# Patient Record
Sex: Male | Born: 1950 | Race: White | Hispanic: No | Marital: Married | Smoking: Current every day smoker
Health system: Southern US, Community
[De-identification: ages and names within clinical notes are randomized; demographics above are authoritative.]

## PROBLEM LIST (undated history)

## (undated) DIAGNOSIS — M5136 Other intervertebral disc degeneration, lumbar region: Secondary | ICD-10-CM

## (undated) DIAGNOSIS — K760 Fatty (change of) liver, not elsewhere classified: Secondary | ICD-10-CM

## (undated) DIAGNOSIS — I509 Heart failure, unspecified: Secondary | ICD-10-CM

## (undated) DIAGNOSIS — I8390 Asymptomatic varicose veins of unspecified lower extremity: Secondary | ICD-10-CM

## (undated) DIAGNOSIS — J189 Pneumonia, unspecified organism: Secondary | ICD-10-CM

## (undated) DIAGNOSIS — R42 Dizziness and giddiness: Secondary | ICD-10-CM

## (undated) DIAGNOSIS — I639 Cerebral infarction, unspecified: Secondary | ICD-10-CM

## (undated) DIAGNOSIS — M545 Low back pain, unspecified: Secondary | ICD-10-CM

## (undated) DIAGNOSIS — F32A Depression, unspecified: Secondary | ICD-10-CM

## (undated) DIAGNOSIS — K219 Gastro-esophageal reflux disease without esophagitis: Secondary | ICD-10-CM

## (undated) DIAGNOSIS — K7460 Unspecified cirrhosis of liver: Secondary | ICD-10-CM

## (undated) DIAGNOSIS — Z8719 Personal history of other diseases of the digestive system: Secondary | ICD-10-CM

## (undated) DIAGNOSIS — R519 Headache, unspecified: Secondary | ICD-10-CM

## (undated) DIAGNOSIS — C229 Malignant neoplasm of liver, not specified as primary or secondary: Secondary | ICD-10-CM

## (undated) DIAGNOSIS — Z950 Presence of cardiac pacemaker: Secondary | ICD-10-CM

## (undated) DIAGNOSIS — I10 Essential (primary) hypertension: Secondary | ICD-10-CM

## (undated) DIAGNOSIS — C801 Malignant (primary) neoplasm, unspecified: Secondary | ICD-10-CM

## (undated) DIAGNOSIS — I209 Angina pectoris, unspecified: Secondary | ICD-10-CM

## (undated) DIAGNOSIS — I4892 Unspecified atrial flutter: Secondary | ICD-10-CM

## (undated) DIAGNOSIS — M51369 Other intervertebral disc degeneration, lumbar region without mention of lumbar back pain or lower extremity pain: Secondary | ICD-10-CM

## (undated) DIAGNOSIS — I442 Atrioventricular block, complete: Secondary | ICD-10-CM

## (undated) DIAGNOSIS — G8929 Other chronic pain: Secondary | ICD-10-CM

## (undated) DIAGNOSIS — R042 Hemoptysis: Secondary | ICD-10-CM

## (undated) DIAGNOSIS — G4730 Sleep apnea, unspecified: Secondary | ICD-10-CM

## (undated) DIAGNOSIS — F329 Major depressive disorder, single episode, unspecified: Secondary | ICD-10-CM

## (undated) DIAGNOSIS — I455 Other specified heart block: Secondary | ICD-10-CM

## (undated) DIAGNOSIS — E119 Type 2 diabetes mellitus without complications: Secondary | ICD-10-CM

## (undated) DIAGNOSIS — M1990 Unspecified osteoarthritis, unspecified site: Secondary | ICD-10-CM

## (undated) DIAGNOSIS — J439 Emphysema, unspecified: Secondary | ICD-10-CM

## (undated) DIAGNOSIS — R51 Headache: Secondary | ICD-10-CM

## (undated) DIAGNOSIS — E039 Hypothyroidism, unspecified: Secondary | ICD-10-CM

## (undated) HISTORY — PX: BACK SURGERY: SHX140

## (undated) HISTORY — DX: Malignant neoplasm of liver, not specified as primary or secondary: C22.9

## (undated) HISTORY — PX: LUMBAR DISC SURGERY: SHX700

## (undated) HISTORY — DX: Presence of cardiac pacemaker: Z95.0

## (undated) HISTORY — DX: Atrioventricular block, complete: I44.2

## (undated) HISTORY — DX: Unspecified atrial flutter: I48.92

---

## 1988-12-10 HISTORY — PX: OTHER SURGICAL HISTORY: SHX169

## 1990-04-11 HISTORY — PX: NASAL SEPTUM SURGERY: SHX37

## 1990-04-11 HISTORY — PX: TONSILLECTOMY AND ADENOIDECTOMY: SUR1326

## 1991-04-12 HISTORY — PX: CHOLECYSTECTOMY: SHX55

## 1997-04-11 HISTORY — PX: POSTERIOR FUSION LUMBAR SPINE: SUR632

## 1998-07-10 ENCOUNTER — Encounter: Payer: Self-pay | Admitting: Neurological Surgery

## 1998-07-10 ENCOUNTER — Ambulatory Visit (HOSPITAL_COMMUNITY): Admission: RE | Admit: 1998-07-10 | Discharge: 1998-07-10 | Payer: Self-pay | Admitting: Neurological Surgery

## 1998-07-23 ENCOUNTER — Encounter: Payer: Self-pay | Admitting: Neurological Surgery

## 1998-07-27 ENCOUNTER — Inpatient Hospital Stay (HOSPITAL_COMMUNITY): Admission: RE | Admit: 1998-07-27 | Discharge: 1998-08-02 | Payer: Self-pay | Admitting: Neurological Surgery

## 1998-07-27 ENCOUNTER — Encounter: Payer: Self-pay | Admitting: Neurological Surgery

## 1998-07-28 ENCOUNTER — Encounter: Payer: Self-pay | Admitting: Neurological Surgery

## 1998-07-29 ENCOUNTER — Encounter: Payer: Self-pay | Admitting: Neurological Surgery

## 1999-01-01 ENCOUNTER — Encounter: Admission: RE | Admit: 1999-01-01 | Discharge: 1999-04-01 | Payer: Self-pay | Admitting: Neurological Surgery

## 1999-01-08 ENCOUNTER — Ambulatory Visit (HOSPITAL_COMMUNITY): Admission: RE | Admit: 1999-01-08 | Discharge: 1999-01-08 | Payer: Self-pay | Admitting: Neurological Surgery

## 1999-01-08 ENCOUNTER — Encounter: Payer: Self-pay | Admitting: Neurological Surgery

## 2000-11-06 ENCOUNTER — Ambulatory Visit (HOSPITAL_COMMUNITY): Admission: RE | Admit: 2000-11-06 | Discharge: 2000-11-06 | Payer: Self-pay | Admitting: Family Medicine

## 2000-11-06 ENCOUNTER — Encounter: Payer: Self-pay | Admitting: Family Medicine

## 2000-11-30 ENCOUNTER — Encounter: Payer: Self-pay | Admitting: Neurological Surgery

## 2000-11-30 ENCOUNTER — Encounter: Admission: RE | Admit: 2000-11-30 | Discharge: 2000-11-30 | Payer: Self-pay | Admitting: Neurological Surgery

## 2001-01-01 ENCOUNTER — Ambulatory Visit (HOSPITAL_COMMUNITY): Admission: RE | Admit: 2001-01-01 | Discharge: 2001-01-01 | Payer: Self-pay | Admitting: Family Medicine

## 2001-01-01 ENCOUNTER — Encounter: Payer: Self-pay | Admitting: Family Medicine

## 2001-03-02 ENCOUNTER — Encounter: Payer: Self-pay | Admitting: Neurological Surgery

## 2001-03-02 ENCOUNTER — Ambulatory Visit (HOSPITAL_COMMUNITY): Admission: RE | Admit: 2001-03-02 | Discharge: 2001-03-02 | Payer: Self-pay | Admitting: Neurological Surgery

## 2001-03-14 ENCOUNTER — Observation Stay (HOSPITAL_COMMUNITY): Admission: AD | Admit: 2001-03-14 | Discharge: 2001-03-16 | Payer: Self-pay | Admitting: Neurological Surgery

## 2001-03-14 ENCOUNTER — Encounter: Payer: Self-pay | Admitting: Neurological Surgery

## 2002-07-30 ENCOUNTER — Encounter: Payer: Self-pay | Admitting: Family Medicine

## 2002-07-30 ENCOUNTER — Ambulatory Visit (HOSPITAL_COMMUNITY): Admission: RE | Admit: 2002-07-30 | Discharge: 2002-07-30 | Payer: Self-pay | Admitting: Family Medicine

## 2002-12-14 ENCOUNTER — Encounter: Payer: Self-pay | Admitting: Emergency Medicine

## 2002-12-14 ENCOUNTER — Emergency Department (HOSPITAL_COMMUNITY): Admission: EM | Admit: 2002-12-14 | Discharge: 2002-12-14 | Payer: Self-pay | Admitting: Emergency Medicine

## 2003-10-09 ENCOUNTER — Ambulatory Visit (HOSPITAL_COMMUNITY): Admission: RE | Admit: 2003-10-09 | Discharge: 2003-10-09 | Payer: Self-pay | Admitting: Family Medicine

## 2003-12-17 ENCOUNTER — Ambulatory Visit (HOSPITAL_COMMUNITY): Admission: RE | Admit: 2003-12-17 | Discharge: 2003-12-17 | Payer: Self-pay | Admitting: Neurological Surgery

## 2004-04-11 HISTORY — PX: SPINAL CORD STIMULATOR IMPLANT: SHX2422

## 2004-09-03 ENCOUNTER — Ambulatory Visit (HOSPITAL_COMMUNITY): Admission: RE | Admit: 2004-09-03 | Discharge: 2004-09-03 | Payer: Self-pay | Admitting: Family Medicine

## 2004-09-20 ENCOUNTER — Ambulatory Visit (HOSPITAL_COMMUNITY): Admission: RE | Admit: 2004-09-20 | Discharge: 2004-09-20 | Payer: Self-pay | Admitting: General Surgery

## 2004-09-22 ENCOUNTER — Ambulatory Visit (HOSPITAL_COMMUNITY): Admission: RE | Admit: 2004-09-22 | Discharge: 2004-09-22 | Payer: Self-pay | Admitting: General Surgery

## 2004-09-29 ENCOUNTER — Ambulatory Visit (HOSPITAL_COMMUNITY): Admission: RE | Admit: 2004-09-29 | Discharge: 2004-09-29 | Payer: Self-pay | Admitting: Family Medicine

## 2004-10-19 HISTORY — PX: OTHER SURGICAL HISTORY: SHX169

## 2004-10-27 ENCOUNTER — Ambulatory Visit: Payer: Self-pay | Admitting: Internal Medicine

## 2004-11-08 ENCOUNTER — Ambulatory Visit (HOSPITAL_COMMUNITY): Admission: RE | Admit: 2004-11-08 | Discharge: 2004-11-08 | Payer: Self-pay | Admitting: Internal Medicine

## 2004-11-08 ENCOUNTER — Ambulatory Visit: Payer: Self-pay | Admitting: Internal Medicine

## 2004-11-08 ENCOUNTER — Encounter: Payer: Self-pay | Admitting: Internal Medicine

## 2004-11-08 HISTORY — PX: COLONOSCOPY: SHX174

## 2004-11-08 HISTORY — PX: ESOPHAGOGASTRODUODENOSCOPY: SHX1529

## 2005-01-03 ENCOUNTER — Ambulatory Visit: Payer: Self-pay | Admitting: Internal Medicine

## 2005-02-03 ENCOUNTER — Other Ambulatory Visit: Admission: RE | Admit: 2005-02-03 | Discharge: 2005-02-03 | Payer: Self-pay | Admitting: Otolaryngology

## 2005-02-04 ENCOUNTER — Ambulatory Visit (HOSPITAL_COMMUNITY): Admission: RE | Admit: 2005-02-04 | Discharge: 2005-02-04 | Payer: Self-pay | Admitting: Otolaryngology

## 2005-05-19 ENCOUNTER — Ambulatory Visit: Payer: Self-pay | Admitting: Physical Medicine and Rehabilitation

## 2005-05-19 ENCOUNTER — Encounter
Admission: RE | Admit: 2005-05-19 | Discharge: 2005-08-17 | Payer: Self-pay | Admitting: Physical Medicine and Rehabilitation

## 2005-06-02 ENCOUNTER — Emergency Department (HOSPITAL_COMMUNITY): Admission: EM | Admit: 2005-06-02 | Discharge: 2005-06-02 | Payer: Self-pay | Admitting: Emergency Medicine

## 2005-06-16 ENCOUNTER — Ambulatory Visit: Payer: Self-pay | Admitting: Internal Medicine

## 2005-06-21 ENCOUNTER — Ambulatory Visit (HOSPITAL_COMMUNITY): Admission: RE | Admit: 2005-06-21 | Discharge: 2005-06-21 | Payer: Self-pay | Admitting: Internal Medicine

## 2005-08-05 ENCOUNTER — Ambulatory Visit (HOSPITAL_COMMUNITY): Admission: RE | Admit: 2005-08-05 | Discharge: 2005-08-05 | Payer: Self-pay | Admitting: Neurological Surgery

## 2005-08-30 ENCOUNTER — Ambulatory Visit (HOSPITAL_COMMUNITY): Admission: RE | Admit: 2005-08-30 | Discharge: 2005-08-30 | Payer: Self-pay | Admitting: Neurological Surgery

## 2005-11-17 ENCOUNTER — Ambulatory Visit (HOSPITAL_COMMUNITY): Admission: RE | Admit: 2005-11-17 | Discharge: 2005-11-17 | Payer: Self-pay | Admitting: Neurological Surgery

## 2006-01-09 ENCOUNTER — Ambulatory Visit: Payer: Self-pay | Admitting: Internal Medicine

## 2008-04-11 HISTORY — PX: ESOPHAGOGASTRODUODENOSCOPY: SHX1529

## 2008-04-29 ENCOUNTER — Ambulatory Visit (HOSPITAL_COMMUNITY): Admission: RE | Admit: 2008-04-29 | Discharge: 2008-04-29 | Payer: Self-pay | Admitting: Family Medicine

## 2008-12-04 DIAGNOSIS — R490 Dysphonia: Secondary | ICD-10-CM | POA: Insufficient documentation

## 2008-12-04 DIAGNOSIS — R1314 Dysphagia, pharyngoesophageal phase: Secondary | ICD-10-CM | POA: Insufficient documentation

## 2008-12-04 DIAGNOSIS — K219 Gastro-esophageal reflux disease without esophagitis: Secondary | ICD-10-CM | POA: Insufficient documentation

## 2008-12-04 DIAGNOSIS — K2900 Acute gastritis without bleeding: Secondary | ICD-10-CM | POA: Insufficient documentation

## 2008-12-04 DIAGNOSIS — R079 Chest pain, unspecified: Secondary | ICD-10-CM | POA: Insufficient documentation

## 2008-12-05 ENCOUNTER — Ambulatory Visit: Payer: Self-pay | Admitting: Internal Medicine

## 2008-12-09 ENCOUNTER — Encounter: Payer: Self-pay | Admitting: Internal Medicine

## 2008-12-16 ENCOUNTER — Ambulatory Visit (HOSPITAL_COMMUNITY): Admission: RE | Admit: 2008-12-16 | Discharge: 2008-12-16 | Payer: Self-pay | Admitting: Internal Medicine

## 2008-12-16 ENCOUNTER — Ambulatory Visit: Payer: Self-pay | Admitting: Internal Medicine

## 2008-12-16 ENCOUNTER — Encounter: Payer: Self-pay | Admitting: Internal Medicine

## 2008-12-18 ENCOUNTER — Encounter: Payer: Self-pay | Admitting: Internal Medicine

## 2008-12-19 ENCOUNTER — Encounter: Payer: Self-pay | Admitting: Urgent Care

## 2008-12-22 ENCOUNTER — Ambulatory Visit (HOSPITAL_COMMUNITY): Admission: RE | Admit: 2008-12-22 | Discharge: 2008-12-22 | Payer: Self-pay | Admitting: Internal Medicine

## 2008-12-23 ENCOUNTER — Telehealth (INDEPENDENT_AMBULATORY_CARE_PROVIDER_SITE_OTHER): Payer: Self-pay

## 2009-01-13 ENCOUNTER — Encounter: Payer: Self-pay | Admitting: Urgent Care

## 2009-01-13 ENCOUNTER — Ambulatory Visit: Payer: Self-pay | Admitting: Internal Medicine

## 2009-01-13 DIAGNOSIS — K7460 Unspecified cirrhosis of liver: Secondary | ICD-10-CM | POA: Insufficient documentation

## 2009-01-15 LAB — CONVERTED CEMR LAB
Ferritin: 194 ng/mL (ref 22–322)
Hep B S Ab: POSITIVE — AB
Hepatitis B Surface Ag: NEGATIVE
Iron: 92 ug/dL (ref 42–165)

## 2009-02-02 ENCOUNTER — Emergency Department (HOSPITAL_COMMUNITY): Admission: EM | Admit: 2009-02-02 | Discharge: 2009-02-02 | Payer: Self-pay | Admitting: Emergency Medicine

## 2009-02-13 ENCOUNTER — Ambulatory Visit (HOSPITAL_COMMUNITY): Admission: RE | Admit: 2009-02-13 | Discharge: 2009-02-13 | Payer: Self-pay | Admitting: Neurological Surgery

## 2009-05-14 ENCOUNTER — Ambulatory Visit: Payer: Self-pay | Admitting: Internal Medicine

## 2009-05-14 ENCOUNTER — Encounter (INDEPENDENT_AMBULATORY_CARE_PROVIDER_SITE_OTHER): Payer: Self-pay

## 2009-05-14 DIAGNOSIS — R109 Unspecified abdominal pain: Secondary | ICD-10-CM | POA: Insufficient documentation

## 2009-05-14 DIAGNOSIS — K859 Acute pancreatitis without necrosis or infection, unspecified: Secondary | ICD-10-CM | POA: Insufficient documentation

## 2009-05-15 ENCOUNTER — Ambulatory Visit (HOSPITAL_COMMUNITY): Admission: RE | Admit: 2009-05-15 | Discharge: 2009-05-15 | Payer: Self-pay | Admitting: Internal Medicine

## 2009-05-15 LAB — CONVERTED CEMR LAB
AFP-Tumor Marker: 2.7 ng/mL (ref 0.0–8.0)
ALT: 67 units/L — ABNORMAL HIGH (ref 0–53)
AST: 31 units/L (ref 0–37)
Basophils Absolute: 0 10*3/uL (ref 0.0–0.1)
Eosinophils Absolute: 0.4 10*3/uL (ref 0.0–0.7)
Eosinophils Relative: 4 % (ref 0–5)
HCT: 50.7 % (ref 39.0–52.0)
Indirect Bilirubin: 0.2 mg/dL (ref 0.0–0.9)
Lipase: 26 units/L (ref 0–75)
MCHC: 33.5 g/dL (ref 30.0–36.0)
MCV: 97.7 fL (ref 78.0–100.0)
Monocytes Absolute: 0.7 10*3/uL (ref 0.1–1.0)
Platelets: 230 10*3/uL (ref 150–400)
RDW: 12.8 % (ref 11.5–15.5)
Total Protein: 7.2 g/dL (ref 6.0–8.3)

## 2009-05-18 ENCOUNTER — Encounter: Payer: Self-pay | Admitting: Internal Medicine

## 2009-06-01 ENCOUNTER — Encounter: Payer: Self-pay | Admitting: Urgent Care

## 2009-06-11 ENCOUNTER — Ambulatory Visit: Payer: Self-pay | Admitting: Internal Medicine

## 2009-06-14 DIAGNOSIS — R1011 Right upper quadrant pain: Secondary | ICD-10-CM | POA: Insufficient documentation

## 2009-06-25 LAB — CONVERTED CEMR LAB
ALT: 50 units/L (ref 0–53)
Albumin: 4.5 g/dL (ref 3.5–5.2)
Alkaline Phosphatase: 121 units/L — ABNORMAL HIGH (ref 39–117)
Total Protein: 7.4 g/dL (ref 6.0–8.3)

## 2009-06-30 ENCOUNTER — Encounter: Payer: Self-pay | Admitting: Urgent Care

## 2009-12-02 ENCOUNTER — Telehealth (INDEPENDENT_AMBULATORY_CARE_PROVIDER_SITE_OTHER): Payer: Self-pay | Admitting: *Deleted

## 2010-04-27 ENCOUNTER — Encounter: Payer: Self-pay | Admitting: Urgent Care

## 2010-05-01 ENCOUNTER — Encounter: Payer: Self-pay | Admitting: Otolaryngology

## 2010-05-02 ENCOUNTER — Encounter: Payer: Self-pay | Admitting: General Surgery

## 2010-05-11 NOTE — Assessment & Plan Note (Signed)
Summary: fu ov 3 mo with RMR per KJ,cirrhosis,esophageal varacies/ams   Visit Type:  Follow-up Visit Primary Care Provider:  Cresenzo  Chief Complaint:  F/U cirrhosis.  History of Present Illness: Patient with Elita Boone cirrhosis here for followup. He is immune to hepatitis A and B;  his iron studies were normal. He has a history of erosive reflux esophagitis and grade 1 esophageal varices. Her ultrasound revealed no tumor; alpha-fetoprotein were normal. He is due for surveillance EGD 2012 , he is due for alpha-fetoprotein and liver imaging in about 6 months.  He does have a new problem that of postprandial right upper quadrant abdominal pain which are reminiscent of Sx which led to his gallbladder removal back in the 90s.  He states he can have pain waxes . IIt chiefly occurs and last for couple hours after he eats a mea; he has history of stable lung lesion on prior CT through this office. He was to followup with Dr. Nobie Putnam per radiologist's recommendations. He has a history of LPR - has seen Drs. Gerilyn Pilgrim and  Dr. Pollyann Kennedy in the past - felt to be under fairly good control at this time. Obesity and type 2 diabetes mellitus continue be a problem for this nice gentleman.  Current Medications (verified): 1)  Multivitamins  Tabs (Multiple Vitamin) .... Take 1 Tablet By Mouth Once A Day 2)  Metformin Hcl 500 Mg Tabs (Metformin Hcl) .... One Tablet Two Times A Day 3)  Budeprion Sr 150 Mg Xr12h-Tab (Bupropion Hcl) .... Take 1 Tablet By Mouth Two Times A Day 4)  Zestril 10 Mg Tabs (Lisinopril) .... Take 1 Tablet By Mouth Once A Day 5)  Androgel Pump 1 % Gel (Testosterone) .... As Directed 6)  Pap Req Taimix .... One Ml As Needed 7)  Protonix 40 Mg Tbec (Pantoprazole Sodium) .... Two Times A Day X 1 Month, Then Once Daily 8)  Vicodin Hp 10-660 Mg Tabs (Hydrocodone-Acetaminophen) .... One By Mouth As Needed For Pain 9)  Glimepiride 2 Mg Tabs (Glimepiride) .... Take 1 Tablet By Mouth Once A Day  in The  Am  Allergies (verified): 1)  ! Nitroglycerin    Past History:  Past Medical History: Last updated: 01/13/2009 Diabetic Hypertension ED Depression Anxiety colonoscopy and EGD by Dr.Zohan Shiflet-2006- normal findings EGD by Dr Jena Gauss 12/16/08->Three columns of grade 1 esophageal varices, four quadrant distal esophageal erosions consistent with erosive reflux esophagitis, widely patent esophagus.  No dilation performed.  Hiatal hernia, portal gastropathy, gastric erosions status post biopsy, patent pylorus, normal D1-D2.  Past Surgical History: Last updated: 12/05/2008 Herniated disks-back and neck Back surgery x 3 Cholecystectomy Nasal surgery Spinal Cord Stimulator  Tumor (right neck)  Family History: Last updated: 12/05/2008 Father: Living age 38   healthy Mother: Living age 74   Hx Ovarian Cancer Siblings: One brother and one sister    healthy  Social History: Last updated: 12/05/2008 Marital Status: Married Children: One child Occupation: Retired/Disabled Patient currently smokes.  Alcohol Use - yes Illicit Drug Use - yes Patient does not get regular exercise.   Vital Signs:  Patient profile:   60 year old male Height:      70 inches Weight:      252 pounds BMI:     36.29 Temp:     97.8 degrees F oral Pulse rate:   84 / minute BP sitting:   142 / 80  (left arm) Cuff size:   regular  Vitals Entered By: Cloria Spring LPN (May 14, 2009 2:57 PM)  Physical Exam  General:  somewhat plus or appearing 60 year old resting comfortably he is alert well oriented. There's no flap Eyes:  no scleral icterus Breasts:  2+ gynecomastia Lungs:  clear to auscultation Abdomen:  obese positive bowel sounds no shifting dullness or fluid wave. He does have localized right upper quadrant tenderness just below the right costal margin anterior axillary line there's no appreciable mass but he does her significant localized tenderness in this area. I do not appreciate his spleen tip  or hepatomegaly  Impression & Recommendations: Impression: Elita Boone cirrhosis with esophageal varices and portal gastropathy. Reflux symptoms well-controlled on Protonix. he now has a new problem, that of post prandial right upper quadrant abdominal pain - gallbladder is out. History of a stable one nodule prior Chest CT Recommendations: Abdominal pelvic CT with IV and oral contrast. Hepatic profile and lipase CBC today would like retrieved last chest CT to make sure  the lung nodule has been wrapped up  He will need alpha-fetoprotein assay and repeat imaging study of his liver via ultrasound in 6 months;  I told Mr. Vejar to plan on getting a repeat surveillance EGD early part of 2012.  Other Orders: T-Hepatic Function [id number]) T-Lipase [id number]) T-CBC w/Diff [id number]) T-AFP Tumor Markers [id number])      Appended Document: Orders Update-charge    Clinical Lists Changes  Orders: Added new Service order of Est. Patient Level IV (28413) - Signed

## 2010-05-11 NOTE — Letter (Signed)
Summary: CT SCAN ORDER  CT SCAN ORDER   Imported By: Ave Filter 05/18/2009 08:53:14  _____________________________________________________________________  External Attachment:    Type:   Image     Comment:   External Document

## 2010-05-11 NOTE — Miscellaneous (Signed)
Summary: last chest tcs  Clinical Lists Changes CT Chest W/CM. - STATUS: Final  IMAGE                                     Perform Date: 13Mar07 14:02  Ordered By: Jena Gauss MD , Gerrit Friends           Ordered Date: 13Mar07 13:17  Facility: [facility]                               Department: CT  Service Report Text  [facility] Accession Number: [id number]    Clinical Data:  Chest pain.  Cough.  Left upper quadrant pain.   CHEST CT WITH CONTRAST:   Technique:  Multidetector CT imaging of the chest was performed   following the standard protocol during bolus administration of   intravenous contrast.   Contrast:  100cc Omnipaque 300.   Comparison:  CT of the chest of 09/22/2004.   Findings:  The nodules described in the left lower lobe measuring 8mm   and 5mm posterior to the right hemidiaphragm in the right lower lobe   appear stable.  The nodule is noted in the middle lobe also are   stable and most consistent with a benign process.  No new lung nodule   is seen.  Follow-up CT is recommended in 9-12 months if this patient   is high risk, i.e. long smoking history, or 18-24 months if low risk.   Emphysematous changes are noted in both upper lobes extending to the   apices.  No effusion is seen.  No mediastinal or hilar adenopathy is   seen.  The thoracic aorta and pulmonary arteries opacify normally.   IMPRESSION:   1. Stable lung nodules.  Suggest follow-up CT in 9-12 months if high   risk and 18-24 months if low risk.   2. COPD.   ABDOMEN CT WITH CONTRAST:   Technique:  Multidetector CT imaging of the abdomen was performed   following the standard protocol during bolus administration of   intravenous contrast.   Contrast:  100cc Omnipaque 300.   Findings:  Scans were continued through the abdomen after oral and IV   contrast media were given and compared to a CT abdomen of 09/20/2004.   The liver is low in attenuation suggesting fatty infiltration.  No   focal abnormality is seen.  Surgical clips are  present from prior   cholecystectomy.  The pancreas is stable in size and configuration,   as are the adrenal glands and the spleen. The kidneys enhance   normally and on delayed images the pelvocaliceal systems appear   normal.  No adenopathy is seen.  The abdominal aorta is normal in   caliber.  The portion of the appendix is well seen and appears   normal, as does the terminal ileum.   IMPRESSION:   Negative CT of the abdomen.  Question mild fatty infiltration of the   liver.  Prior cholecystectomy.    Read By:  Juline Patch,  [name]   Released By:  Juline Patch,  [name]  Additional Information  External image : [id number]  Appended Document: last chest tcs if this is the last Chest CT done, he needs another one right away to f/u on previous lung nodule  Appended Document: last chest tcs pt in radiology now  for CTof the abd/pelvis. CM called radiology and added chest ct to be done while pt is there.   Appended Document: last chest tcs very good

## 2010-05-11 NOTE — Letter (Signed)
Summary: Internal Other Tenna Child fax/06/02/2009  Internal Other Tenna Child fax/06/02/2009   Imported By: Cloria Spring LPN 37/16/9678 93:81:01  _____________________________________________________________________  External Attachment:    Type:   Image     Comment:   External Document

## 2010-05-11 NOTE — Progress Notes (Signed)
Summary: Repeat Image Study  ---- Converted from flag ---- ---- 12/01/2009 7:56 PM, Joselyn Arrow FNP-BC wrote: Looks like not seen since 05/2009, so I think he needs OV 1st  ---- 12/01/2009 5:18 PM, Ave Filter wrote: Would you like this patient to have a repeat U/S only? ------------------------------  Appended Document: Repeat Image Study pt is aware of appt for 9/16 at 0945 with RMR

## 2010-05-11 NOTE — Medication Information (Signed)
Summary: Tax adviser   Imported By: Diana Eves 06/01/2009 08:53:50  _____________________________________________________________________  External Attachment:    Type:   Image     Comment:   External Document  Appended Document: RX FolderPANTOPRAZOLE    Prescriptions: PROTONIX 40 MG TBEC (PANTOPRAZOLE SODIUM) one by mouth daily  #90 x 3   Entered and Authorized by:   Joselyn Arrow FNP-BC   Signed by:   Joselyn Arrow FNP-BC on 06/01/2009   Method used:   Printed then faxed to ...       [facility] (mail-order)       [address] [place], [place]  [address]       Ph: [phone]       Fax: [phone]   RxID:   [id number]  Please fax to [facility].   Appended Document: RX Folder Rx faxed.

## 2010-05-11 NOTE — Assessment & Plan Note (Signed)
Summary: ov fu in couple of weeks to reaccess/ss   Visit Type:  Follow-up Visit Primary Care Provider:  Cresenzo  Chief Complaint:  F/U abd pain.  History of Present Illness: Right  upper quadrant abdominal pain not much change.  definitely worse when he eats; sometimes doubles him over; it does not radiate; he does have a spinal cord stimulator which precludes an MRI.  CT of abdomen and pelvis demonstrated some upper limit of normal celiac nodes, stable pulmonary nodule since 2007 - nothing to explain his symptoms. Never developed a rash. No melena no hematochezia negative colonoscopy 2006.  He has not lost any weight. Minimal elevation in his ALT previously no dilation of the biliary tree. He cannot have an MRCP because of his nerve stimulator.  Pain is not worse  sitting, standing or moving. Overall, has not worsened since his last office visit. He has tken Neurontin the past and he did not like it. He's never developed a rash.  He reports no alcohol consumption whatsoever since last fall. Weight is stable at 252 pounds.  Current Medications (verified): 1)  Multivitamins  Tabs (Multiple Vitamin) .... Take 1 Tablet By Mouth Once A Day 2)  Metformin Hcl 500 Mg Tabs (Metformin Hcl) .... One Tablet Two Times A Day 3)  Budeprion Sr 150 Mg Xr12h-Tab (Bupropion Hcl) .... Take 1 Tablet By Mouth Two Times A Day 4)  Zestril 10 Mg Tabs (Lisinopril) .... Take 1 Tablet By Mouth Once A Day 5)  Androgel Pump 1 % Gel (Testosterone) .... As Directed 6)  Pap Req Taimix .... One Ml As Needed 7)  Protonix 40 Mg Tbec (Pantoprazole Sodium) .... One By Mouth Daily 8)  Vicodin Hp 10-660 Mg Tabs (Hydrocodone-Acetaminophen) .... One By Mouth As Needed For Pain 9)  Glimepiride 2 Mg Tabs (Glimepiride) .... Take 1 Tablet By Mouth Once A Day  in [place]  Allergies (verified): 1)  ! Nitroglycerin  Past History:  Past Medical History: Last updated:  01/13/2009 Diabetic Hypertension ED Depression Anxiety colonoscopy and EGD by Dr.Letoya Stallone-2006- normal findings EGD by Dr Jena Gauss 12/16/08->Three columns of grade 1 esophageal varices, four quadrant distal esophageal erosions consistent with erosive reflux esophagitis, widely patent esophagus.  No dilation performed.  Hiatal hernia, portal gastropathy, gastric erosions status post biopsy, patent pylorus, normal D1-D2.  Past Surgical History: Last updated: 12/05/2008 Herniated disks-back and neck Back surgery x 3 Cholecystectomy Nasal surgery Spinal Cord Stimulator  Tumor (right neck)  Family History: Last updated: 12/05/2008 Father: Living age 35   healthy Mother: Living age 56   Hx Ovarian Cancer Siblings: One brother and one sister    healthy  Social History: Last updated: 12/05/2008 Marital Status: Married Children: One child Occupation: Retired/Disabled Patient currently smokes.  Alcohol Use - yes Illicit Drug Use - yes Patient does not get regular exercise.   Risk Factors: Exercise: no (12/05/2008)  Vital Signs:  Patient profile:   60 year old male Height:      70 inches Weight:      252 pounds BMI:     36.29 Temp:     97.9 degrees F oral Pulse rate:   88 / minute BP sitting:   130 / 80  (left arm) Cuff size:   regular  Vitals Entered By: Cloria Spring LPN (June 11, 1608 8:52 AM)  Physical Exam  General:  alert conversant no acute distress Eyes:  no scleral icterus Chest Wall:  he does have some tenderness along his right costal  margin mid axillary line to palpation. Do not appreciate any bony deformity. Lungs:  clear to auscultation Abdomen:  obese positive bowel sounds A. Right upper quadrant really not tender he gets tender he palpate the right costal margin I do not appreciate any deformity or mass Extremities:  trace lower extremity edema Neurologic:  back no CVA tenderness  Impression & Recommendations: Impression: A several month history of right  upper quadrant abdominal pain somewhat reminiscent to his gallbladder symptoms. Minimally elevated the ALT previously no evidence of biliary dilation  or space-occupying lesion in his liver.  stable pulmonary  nodules.  His symptoms do have a biliary flare particularly postprandial component. He does have localized tenderness along the right costal margin which brings to mind a musculoskeleatal etiology etiology. Less likely, I feel this is radicular in origin but certainly could be a neuropathic component related to diabetes is not question either. Treatment options maybe limited.   Recommendations: Repeat hepatic profile .     immunofecal occult stool blood test; consider further evaluation in the very near future.  Other Orders: T-Hepatic Function [phone] [phone])      Appended Document: Orders Update-charge    Clinical Lists Changes  Problems: Added new problem of ABDOMINAL PAIN, RIGHT UPPER QUADRANT (ICD-789.01) Orders: Added new Service order of Est. Patient Level IV (91478) - Signed

## 2010-05-13 NOTE — Medication Information (Signed)
Summary: PROTONIX TAB 40MG   PROTONIX TAB 40MG    Imported By: Rexene Alberts 04/27/2010 08:14:55  _____________________________________________________________________  External Attachment:    Type:   Image     Comment:   External Document  Appended Document: PROTONIX TAB 40MG     Prescriptions: PROTONIX 40 MG TBEC (PANTOPRAZOLE SODIUM) one by mouth daily  #90 x 3   Entered and Authorized by:   Joselyn Arrow FNP-BC   Signed by:   Joselyn Arrow FNP-BC on 04/27/2010   Method used:   Print then Give to Patient   RxID:   [id number]     Appended Document: PROTONIX TAB 40MG  rx faxed to [facility]

## 2010-05-28 ENCOUNTER — Encounter (INDEPENDENT_AMBULATORY_CARE_PROVIDER_SITE_OTHER): Payer: Self-pay

## 2010-06-02 NOTE — Letter (Signed)
Summary: Recall Colonoscopy/Endoscopy, Change to Office Visit  [facility]  [address]   [place], [place] [address]   Phone: [phone]  Fax: [phone]      May 28, 2010   Ralph [name] [place], [place]  [address] Mar 31, 1951   Dear Mr. Gorder,   According to our records, it is time for you to schedule a Colonoscopy/Endoscopy. However, after reviewing your medical record, we recommend an office visit in order to determine your need for a repeat procedure.  Please call [phone] at your convenience to schedule an office visit. If you have any questions or concerns, please feel free to contact our office.   Sincerely,   Cloria Spring LPN  [facility] Ph: [phone]   Fax: [phone]

## 2010-06-03 ENCOUNTER — Encounter (INDEPENDENT_AMBULATORY_CARE_PROVIDER_SITE_OTHER): Payer: Self-pay | Admitting: *Deleted

## 2010-06-08 NOTE — Letter (Signed)
Summary: Recall, Screening Colonoscopy Only  [facility]  [address]   [place], [place] [address]   Phone: [phone]  Fax: [phone]    June 03, 2010  RAWSON MINIX [place], [place]  [address] 1950-09-11   Dear Mr. Scaturro,   Our records indicate it is time to schedule your colonoscopy.   Please call our office at [phone] and ask for the nurse.   Thank you, Hendricks Limes, LPN Cloria Spring, LPN  [facility] Ph: [phone]   Fax: [phone]

## 2010-06-30 LAB — CREATININE, SERUM
Creatinine, Ser: 0.99 mg/dL (ref 0.4–1.5)
GFR calc Af Amer: >60 mL/min (ref >60)
GFR calc non Af Amer: >60 mL/min (ref >60)

## 2010-07-16 LAB — PROTIME-INR
INR: 1 (ref 0.00–1.49)
Prothrombin Time: 13.4 seconds (ref 11.6–15.2)

## 2010-07-16 LAB — COMPREHENSIVE METABOLIC PANEL
Albumin: 3.6 g/dL (ref 3.5–5.2)
Alkaline Phosphatase: 82 U/L (ref 39–117)
BUN: 20 mg/dL (ref 6–23)
CO2: 28 mEq/L (ref 19–32)
Chloride: 103 mEq/L (ref 96–112)
Creatinine, Ser: 0.8 mg/dL (ref 0.4–1.5)
GFR calc non Af Amer: >60 mL/min (ref >60)
Glucose, Bld: 107 mg/dL — ABNORMAL HIGH (ref 70–99)
Potassium: 4.5 mEq/L (ref 3.5–5.1)
Total Bilirubin: 0.9 mg/dL (ref 0.3–1.2)

## 2010-07-16 LAB — CBC
HCT: 49.9 % (ref 39.0–52.0)
Hemoglobin: 17.3 g/dL — ABNORMAL HIGH (ref 13.0–17.0)
MCV: 98.7 fL (ref 78.0–100.0)
Platelets: 217 10*3/uL (ref 150–400)
WBC: 8.4 10*3/uL (ref 4.0–10.5)

## 2010-07-16 LAB — GLUCOSE, CAPILLARY

## 2010-08-27 NOTE — Op Note (Signed)
NAME:  Ralph [name], Ralph [name]                ACCOUNT NO.:  [id number]   MEDICAL RECORD NO.:  [id number]          PATIENT TYPE:  AMB   LOCATION:  SDS                          FACILITY:  [facility]   PHYSICIAN:  Stefani Dama, [name]  DATE OF BIRTH:  11/10/1950   DATE OF PROCEDURE:  08/30/2005  DATE OF DISCHARGE:  08/30/2005                                 OPERATIVE REPORT   PREOPERATIVE DIAGNOSIS:  Chronic lumbar pain with radiculopathy.   POSTOPERATIVE DIAGNOSES:  1.  Chronic lumbar pain with radiculopathy.  2.  Status post arthrodesis L2-L3.   HOSPITAL PROCEDURE:  Placement of a temporary spinal cord stimulation  electrode, T9-T10.   SURGEON:  Stefani Dama, [name]   ANESTHESIA:  Local plus IV sedation.   INDICATIONS:  [name] is a 60 year old individual who underwent a  diskectomy followed by fusion at the L2-L3 level or at L1-L2, depending on  how his lumbar vertebrae are counted.  The patient has had problems with  significant spondylosis.  He has had a previous herniated nucleus pulposus  at the L4-L5 level, and he has had chronic radicular pain with multiple  level degeneration from the top of the lumbar spine down to the lumbosacral  junction.  Because of the diffuseness of his disease process, it was advised  that we treat him conservatively and, having failed efforts at narcotic pain  management, conservative management and non-narcotic pain management, a  spinal cord stimulators has now been advised   PROCEDURE:  The patient was brought to the operating room and placed on  table in prone position.  IV analgesia was induced with some Versed.  The  patient had the back prepped with DuraPrep, and draped in sterile fashion.  Skin overlying the T12-L1 level was infiltrated with lidocaine, and an 11-  blade was used to create a stab incision at the chosen entry point.  A 17-  gauge Tuohy needle was then inserted into the epidural space at the T12-L1  level.  Initial attempts  at threading a catheter were unsuccessful, as the  catheter was noted to proceed ventrally in the spinal canal.  The needle was  repositioned in the same space and, again, attempts to place the catheter in  the dorsal epidural space was unsuccessful, and at this point, on removal of  the catheter, there was noted be some spinal fluid.  It was felt that the  patient had a wet tap and, for that reason, a level above the chosen entry  site was chosen, and on the second try, the catheter was placed into the  dorsal epidural space.  It was threaded into the position along T9-T10, and  initial trials of stimulation with this catheter were successful in terms of  giving the patient tingling sensation  along his back and buttocks.  With that, the needle was withdrawn, stylet  was withdrawn, the system was then sutured in place with a singular 2-0 silk  tie.  A dry sterile dressing was applied and the patient was returned to  recovery room in stable condition.  Stefani Dama, [name]  Electronically Signed     HJE/MEDQ  D:  08/30/2005  T:  08/31/2005  Job:  [id number]

## 2010-08-27 NOTE — Group Therapy Note (Signed)
HISTORY OF PRESENT ILLNESS:  [name] is a 60 year old gentleman referred  by Dr. Nobie Putnam. He was referred for management of his chronic pain  complaints. [name] states he has multiple pain areas that are bothering  him including the right shoulder, the mid thoracic area, the low back into  the right buttock and down the left lower extremity. He states his low back  pain is between a 4 and an 8 on a scale of 10. When his pain is at a 4, he  can function. But when it gets up to a 7 or 8, he has difficulty  functioning. His leg pain is between a 3 and 6 on a scale of 10 and varies  in its intensity. He has pain essentially all the time but the intensity of  his pain varies and the nature of his pain varies as well, sometimes more  tingling, aching or stabbing. His sleep is poor. He gets little relief with  the current medications that he is on. The pain is typically exacerbated by  activity. Improves with rest. Ralph [name] also states he is quite depressed.  He has had some episodes where he has considered suicide; however, at this  time, he does not feel he has a plan nor would he carry it out. He has  thought about it but at this point, would not act on it. He states that he  would call his primary care physician if he felt that he was headed in the  direction of suicide attempt. He is requesting some help in this arena,  however. He states he does have some guns at home and he has considered  crashing his car. He can walk between 3 and 5 minutes at a time. He is able  to climb stairs. He is not driving currently. 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 He needs assistance with dressing, bathing, meal prep, household  duties and shopping.   REVIEW OF SYSTEMS:  Positive for weakness, numbness, tremor, tingling,  trouble walking, spasms, confusion, depression, anxiety and occasional  suicide ideation without any  intent to act on it at this point. Review of  systems also positive for weight gain and sleep apnea.   PHYSICIANS:  Current physician's involved in [name] care include Dr.  Nobie Putnam and Dr. Danielle Dess.   PAST MEDICAL HISTORY:  Negative for diabetes, ulcers, cancer, kidney  problems, thyroid problems, heart problems or high blood pressure. He states  it is positive for history of elevated liver function studies.   PAST SURGICAL HISTORY:  Positive for diskectomy at L1-L2 October 1995.  Fusion L1-L2 May 1999. Disk resection December 2002 and gallbladder surgery  in 1994. Deviated septum surgery in 1992.   SOCIAL HISTORY:  The patient is married. Lives with his wife. He has been  married 16 years. He also has several dogs at home. Denies illegal drug use.  Reports occasional alcohol use. Smokes 1-1/2 packsof cigarettes a day for  30+ years.   FAMILY HISTORY:  Mother alive at 74 with ovarian cancer. Father alive at 52  with osteoarthritis. Brother and 1 sister, both healthy.   PHYSICAL EXAMINATION:  VITAL SIGNS:  Blood pressure 138/68, pulse  98,respirations16, 100% saturated on room air.  GENERAL:  He is a well-developed, well-nourished gentleman. Does not appear  in any distress.  NEUROLOGIC:  He is oriented x3. Affect is bright and  alert. He is  cooperative and pleasant today. He does not appear depressed but fairly  forthright in presentation of his various problems. He is able to stand.  Gait is non-antalgic. He uses good base of support. Heel-toe mechanics are  within normal limits. He has normal tandem gait. Romberg test negative.  Motor strength is good throughout, upper and lower extremities. Coordination  is grossly intact overall. Reflexes are 2+ in the upper extremities, 1+ in  the lower extremities. No clonus noted. Minimal tenderness is noted over the  paraspinal musculature in the lumbar spine. He has a well-healed upper  lumbar surgical scar and he has a smaller scar  on the left in the lower  lumbar area, which is also well healed.   IMPRESSION:  1.  Depression.  2.  Multi-level degenerative disk disease and lumbar spondylosis, status      post laminectomy and diskectomy at L1-L2.  3.  History of chronic left radicular leg pain.  4.  Insomnia.   PLAN:  Would like Ralph [name] to obtain recent blood work for me within the  last 6 months with particularly liver function and renal function. Would  also like to get previous records from Dr. Vear Clock' office regarding his  pain management course at that pain management clinic. With regards to a  plan for Ralph [name], he had mentioned he gets some spasms and jumping in the  left lower extremity, which were not helped much by Requip. Would consider  baclofen for him. Today I will get him set up to see Dr. Leonides Cave and to see  if we can get him into behavioral health as well for further evaluation and  treatment of his depression. He is currently off all narcotics for over a  week now. Would consider adding Neurontin, possibly Lyrica, the use of non-  steroidals as well, possibly Ultram or Ultracet. May also consider epidural  in the future. Will need to, however, gather some more information on this  gentleman, especially with respect to previous treatment and his previous  blood work to evaluate liver function in light of the fact that he believes  his liver functions may be somewhat elevated. Will also check a urine drug  screen. I will see him back in a month and will discuss pain management  strategy further with him when some more information is available.           ______________________________  Ralph [name], [name]     DMK/MedQ  D:  05/20/2005 14:51:00  T:  05/21/2005 13:36:47  Job #:  [id number]   cc:   Gladstone Pih, Ph.D.  [address] [place]  [place] [address]   Patrica Duel, [name]  Fax: [phone]

## 2010-08-27 NOTE — Op Note (Signed)
NAME:  LEODAN, BOLYARD NO.:  [id number]   MEDICAL RECORD NO.:  [id number]          PATIENT TYPE:  OIB   LOCATION:  3172                         FACILITY:  [facility]   PHYSICIAN:  Stefani Dama, [name]  DATE OF BIRTH:  15-Jun-1950   DATE OF PROCEDURE:  11/17/2005  DATE OF DISCHARGE:                                 OPERATIVE REPORT   PREOPERATIVE DIAGNOSIS:  Intractable back pain, lumbar spondylosis, lumbar  radiculopathy.   POSTOPERATIVE DIAGNOSIS:  Intractable back pain, lumbar spondylosis, lumbar  radiculopathy.   PROCEDURE:  Insertion of permanent spinal cord stimulator, Tripole lead,  Restored generator.   SURGEON:  Stefani Dama, [name]   ANESTHESIA:  General endotracheal.   INDICATIONS:  [place] Grosser is a 60 year old individual who has had  significant back and bilateral lower extremity pain.  He has had previous  difficulties with spondylosis at the L1-L2 level, where he had a large  ruptured disc which included an endplate fracture, so was decompressed and  fused a number of years ago.  He subsequently developed other difficulties  with spondylitic degeneration at multiple levels in his lumbar spine.  He  was taken to the operating room about 3 months ago, where he underwent trial  of a temporary stimulator which seemed to given successful relief of pain.   PROCEDURE:  The patient was brought to the operating room supine on the  stretcher. After smooth induction of general endotracheal anesthesia, he was  turned prone.  The back was prepped with DuraPrep and draped in a sterile  fashion.  Midline incision was created near the thoracolumbar junction using  fluoroscopic localization to localize the T12-L1 junction. Laminotomy of the  T12 vertebra was then created. The dura was exposed in.  Then, a trial  paddle lead was placed. The epidural space was free and clear, and a  tripolar lead was then placed and secured at the superior end being between  the  junction of T8 and T9.  This was verified with fluoroscopy. A pouch was  created over the right posterior-superior iliac crest region.  This was in  the superficial fascia.  A tunnel was created between the thoracolumbar  junction and the pouch incision.  Connecting leads were passed through the  subcutaneous tunnel.  The leads were connected at the superior end, and then  a boot was placed over the connection, and the connection itself was sutured  to the paralaminar tissues.  The remainder of the catheter was coiled into  this area.  The fascia was closed over this region, and then again  radiographic confirmation of placement of the lead was checked to make sure  that the lead had not migrated. The distal end was then connected to the  Restore stimulator generator, and the subcutaneous pouch was then enlarged  to allow placement of the generator pack. This was then held in place with  some sutures in the fascia overlying the generator pack. The remainder of  the excess lead was coiled underneath the  generator.  The subcuticular tissue was then closed with 3-0 Vicryl  in  interrupted fashion in both incisions.  Dermabond was placed on the skin.  The patient tolerated the procedure well and was returned to the recovery  room in stable condition.      Stefani Dama, [name]  Electronically Signed     HJE/MEDQ  D:  11/17/2005  T:  11/17/2005  Job:  [id number]

## 2010-08-27 NOTE — H&P (Signed)
[place]  Patient:    Ralph [name], Ralph [name] Visit Number: [id number] MRN: [id number]          Service Type: SUR Location: 6700 [id number] Attending Physician:  Jonne Ply Dictated by:   Stefani Dama, [name] 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  HISTORY OF PRESENT ILLNESS:  The patient is a 60 year old right-handed male who was known to the office because of significant problems with previous herniated nucleus pulposus at the L1-L2 level eccentric to the left side.  He had a diskectomy and, ultimately, underwent an arthrodesis at this level.  The patient had returned to me because of intermittent problems with back and left lower extremity pain.  Previous MRIs demonstrated only spondylitic disease throughout the lumbar spine; however, on the 22nd he underwent an MRI of the lumbar spine which demonstrated the presence of a large extruded fragment of disk at the L4-5 level behind the body of L5.  The patient had been experiencing some left lumbar radiculopathy for several weeks prior, and the pain has been severe and excruciating since that time.  He was seen emergently in the office because of exacerbation of pain yesterday, and he was found to have a large fragment of disk on the MRI and foot drop with tibialis anterior weakness graded a 3/5.  The patient was advised regarding surgical extirpation of the disk, and he is admitted now for this process.  PAST MEDICAL HISTORY:  The patients general health has been fair.  He has had problems with chronic back pain, for which he takes oxycodone.  CURRENT MEDICATIONS:  Ultracet and Celebrex for spondylitic disease in the back.  Valium on a p.r.n. basis for muscle spasms in his back.  Iron supplements and Viagra for sexual  dysfunction.  ALLERGIES:  He notes an allergy to HYDROCODONE, which he states causes itching.  PAST SURGICAL HISTORY: 1. Back surgery on two occasions over the past six years. 2. Nasal surgery in the early 1990s. 3. Cholecystectomy in 1997. 4. Vasectomy in 1977.  PHYSICAL EXAMINATION:  GENERAL:  Alert, oriented, cooperative individual in no overt distress.  BACK:  Range of motion of his back reveals that he flexes forward some 60 degrees.  He extends 10 degrees.  Palpation and percussion of his back reproduce moderate muscle spasm only.  EXTREMITIES:  Motor strength in the lower extremities reveals tibialis anterior weakness on the left side at 3/5, extensor hallucis longus weakness at 3/5.  Tone and bulk are normal in the distal lower extremities.  Sensation is diminished slightly in the dorsum of the left foot.  Straight leg raising is positive on the left side at 30 degrees.  Patricks maneuver is negative bilaterally.  Upper extremity strength and reflexes are within the limits of normal.  Cranial nerve examination reveals the pupils are 4 mm, brisk, reactive to light and accommodation.  Extraocular movements are full, and the face is symmetric to grimace.  Tongue and uvula are in the midline.  Sclerae and conjunctivae are clear.  NECK:  Supple.  Range of motion is good.  Axial compression reproduces no pain.  LUNGS:  Clear to auscultation.  HEART:  Regular rate and rhythm.  ABDOMEN:  Soft, protuberant.  Bowel sounds  positive.  No masses are palpable.  EXTREMITIES:  No clubbing, cyanosis, or edema.  IMPRESSION:  The patient has evidence of a herniated nucleus pulposus at L4-L5 on the left side with left lumbar radiculopathy.  He is now admitted to undergo surgical extirpation of the disk. Dictated by:   Stefani Dama, [name] Attending Physician:  Jonne Ply DD:  03/15/01 TD:  03/15/01 Job: [id number]

## 2010-08-27 NOTE — Op Note (Signed)
NAME:  Ralph [name], Ralph [name]                ACCOUNT NO.:  [id number]   MEDICAL RECORD NO.:  [id number]          PATIENT TYPE:  AMB   LOCATION:  DAY                           FACILITY:  [facility]   PHYSICIAN:  R. Roetta Sessions, [name] DATE OF BIRTH:  02/13/1951   DATE OF PROCEDURE:  11/08/2004  DATE OF DISCHARGE:                                 OPERATIVE REPORT   PROCEDURE PERFORMED:  Esophagogastroduodenoscopy with biopsy followed by  screening colonoscopy.   INDICATIONS FOR PROCEDURE:  The patient is a 60 year old gentleman with  right upper quadrant abdominal pain.  He is here for EGD and colonoscopy.  The latter is mainly for screening purposes.  This approach has been  discussed with the patient previously and again at his bedside.  The  potential risks, benefits and alternatives have been reviewed and questions  answered.  The patient is agreeable.  Please see.   PROCEDURE NOTE:  In the medical record for more information.   Oxygen saturations, blood pressure, pulse and respirations were monitored  throughout the entirety of the procedure.   CONSCIOUS SEDATION:  Versed 4 mg IV, Demerol 75 mg IV in divided doses.   INSTRUMENT USED:  Olympus video chip system.   FINDINGS:  Examination of the tubular esophagus revealed two linear erosions  coming up 2 cm from the esophagogastric junction.  There were no other  esophageal mucosal abnormalities noted.  EG junction was easily traversed.   Stomach:  The gastric cavity was emptied and insufflated well with air.  Thorough examination of the gastric mucosa including retroflex view of the  proximal stomach, esophagogastric junction demonstrated some mucosal  hemorrhage and some nodularity of the fundal mucosa, please see photos.  There was a small hiatal hernia seen retroflexed, otherwise the gastric  mucosa appeared normal.  Pylorus was patent and easily traversed.  Examination of the bulb, second portion revealed bulbar erosions but no  frank  ulcer.  Otherwise D1 and D2 appeared normal.   THERAPY/DIAGNOSTIC MANEUVERS PERFORMED:  The gastric mucosa in the fundus  was biopsied for histologic study.  The patient tolerated the procedure well  and was prepared for colonoscopy.   Digital rectal exam revealed no abnormalities.   ENDOSCOPIC FINDINGS:  Prep was good.  Rectum:  Examination of rectal mucosa including retroflex view of the anal  verge revealed no abnormalities.  Colon:  The colonic mucosa was surveyed from the rectosigmoid junction to  the left, transverse and right colon to the area of the appendiceal orifice  and ileocecal valve and cecum.  These structures were well seen and  photographed.  The terminal ileum was intubated to 10 cm.  From this level,  the scope was slowly withdrawn. All previously mentioned mucosal surfaces  were again seen.  The prep was suboptimal on the right side.  A thin coating  of tenacious stool made survey of the finer detail of the mucosa on the  right side more difficult.  The colonic mucosa did appear to be normal.  The  terminal ileum appeared normal.  The patient tolerated the above procedures  well,  was reacted in endoscopy.   IMPRESSION:  1.  Distal esophageal erosions consistent with erosive reflux esophagitis,      otherwise normal esophagus.  2.  Areas of hemorrhage and nodularity of the fundal mucosa of uncertain      significance, biopsied.  Small hiatal hernia, otherwise normal stomach.      Bulbar erosions, otherwise normal D1 and D2.   COLONOSCOPY FINDINGS:  Normal rectum, colon, TI.   RECOMMENDATIONS:  1.  Repeat screening colonoscopy in 10 years.  2.  Check Helicobacter pylori serologies today.  Follow-up on pathology.  3.  Begin Zegerit  40 mg orally each morning.  Antireflux literature      provided to Mr. Eckert.  4.  Follow-up appointment in eight weeks.       RMR/MEDQ  D:  11/08/2004  T:  11/08/2004  Job:  [id number]   cc:   Patrica Duel, [name]  [address], [address]  [place]  [place] [address]  Fax: [phone]

## 2010-08-27 NOTE — Op Note (Signed)
[place] [name]. [facility]  Patient:    Ralph [name], Ralph [name] Visit Number: [id number] MRN: [id number]          Service Type: SUR Location: 6700 [id number] Attending Physician:  Jonne Ply Dictated by:   Stefani Dama, [name] Proc. 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  PROCEDURE:  L4-5 lumbar microendoscopic diskectomy with Met-RX and operating microscope, microdissection technique.  SURGEON:  Stefani Dama, [name]  FIRST ASSISTANT:  Payton Doughty, [name]  ANESTHESIA:  General endotracheal.  INDICATION:  Ralph [name] is a 60 year old individual who has had significant back and left lower extremity pain and weakness in the left foot.  He has had on his MRI a large herniated nucleus pulposus at L4-5 behind the body of L5. He has had significant pain and now developed weakness in his tibialis anterior group.  The patient is poorly-controlled with any oral medications.  DESCRIPTION OF PROCEDURE:  The patient was brought to the operating room supine on a stretcher.  After the smooth induction of general endotracheal anesthesia, he was turned prone and the back was shaved, prepped with Duraprep, and draped in a sterile fashion.  Radiographic localization using the C-arm was performed, and L4-5 was identified.  The interspaces were then identified with a K-wire being placed over the L4-5 interspace, and then a series of dilators were used with a wanding technique to dissect the soft tissues on the left side at the L4-5 interlaminar space.  Ultimately a 7 cm deep x 18 mm endoscopic cannula was fixed to the operating table, clamped at L4-5.  Subcutaneous dissection of the paraspinous musculature was  then obtained over the interlaminar space and then with microscope in place, a microdissection technique using a 2.3 mm dissecting bur to resect the inferior margin of the lamina at the medial wall of the facet.  The yellow ligament was then taken off, and the common dural tube underneath this was identified. Dissection was then carried inferiorly, and the takeoff of the L5 nerve root was noted to be tented dorsally over a significant mass.  By dissecting in the area just above the nerve root, a small fragment of disk was retrieved.  It was felt that further disk fragments existed inferiorly.  Then in the crotch of the L5 nerve root, dissection of the epidural veins revealed a large fragment of disk, which was extracted as a singular piece.  Several other small fragments of disk were then found under the nerve root, but once this was accomplished, venous bleeding was noted to be rather significant, and after tamponading this, no other fragments of disk were found.  Some Gelfoam soaked in thrombin that was used for the tamponade was removed and later irrigated away.  Bipolar cautery was used to carefully maintain hemostasis in this area.  Once this was achieved adequately, the endoscopic cannula was removed.  The paraspinous fascia was closed with 3-0 Vicryl in interrupted fashion.  The subcuticular tissue was closed with 3-0 Vicryl also.  The patient tolerated the procedure well and returned to the recovery room in stable condition. Dictated by:   Stefani Dama, [name] Attending Physician:  Jonne Ply DD:  03/14/01 TD:  03/15/01 Job: [id number]

## 2010-08-27 NOTE — Discharge Summary (Signed)
[place]. [facility]  Patient:    Ralph [name], Ralph [name] Visit Number: [id number] MRN: [id number]          Service Type: SUR Location: 6700 6733 02 Attending Physician:  Jonne Ply Dictated by:   Stefani Dama, [name] Admit Date:  03/14/2001 Discharge Date: 03/16/2001                             Discharge Summary  ADMITTING DIAGNOSIS:  Herniated nucleus pulposus L4-5 left with left lumbar radiculopathy.  DISCHARGE DIAGNOSIS:  Herniated nucleus pulposus L4-5 left with left lumbar radiculopathy.  CONDITION ON DISCHARGE:  Improved.  HOSPITAL COURSE:  The patient is a 60 year old individual who has had significant back and left lower extremity pain, had a large extruded fragment of disk at L4-5 on the left.  He had significant lumbar radiculopathy.  He was advised regarding surgical decompression of this process and was taken to the operating room on the evening of admission.  Postoperatively, he initially seemed to do quite well but yesterday on the evening of March 15, 2001 he developed significant lumbar radiculopathy seemingly in the same distribution. He was treated with some additional medications and the pain seems to be lessening.  He is discharged at this time with a prescription for Percocet as needed for pain.  He will be seen in the office in two weeks time for further followup.  His incision is clean and dry.  He is also given a prescription for Celebrex 200 mg b.i.d. Dictated by:   Stefani Dama, [name] Attending Physician:  Jonne Ply DD:  03/16/01 TD:  03/16/01 Job: [id number]

## 2011-05-11 ENCOUNTER — Emergency Department (HOSPITAL_COMMUNITY): Payer: Medicare Other

## 2011-05-11 ENCOUNTER — Encounter (HOSPITAL_COMMUNITY): Payer: Self-pay | Admitting: *Deleted

## 2011-05-11 ENCOUNTER — Other Ambulatory Visit: Payer: Self-pay

## 2011-05-11 ENCOUNTER — Emergency Department (HOSPITAL_COMMUNITY)
Admission: EM | Admit: 2011-05-11 | Discharge: 2011-05-11 | Disposition: A | Discharge status: Home / Self Care | Payer: Medicare Other | Attending: Emergency Medicine | Admitting: Emergency Medicine

## 2011-05-11 DIAGNOSIS — R000 Tachycardia, unspecified: Secondary | ICD-10-CM | POA: Insufficient documentation

## 2011-05-11 DIAGNOSIS — I10 Essential (primary) hypertension: Secondary | ICD-10-CM | POA: Insufficient documentation

## 2011-05-11 DIAGNOSIS — G8929 Other chronic pain: Secondary | ICD-10-CM | POA: Insufficient documentation

## 2011-05-11 DIAGNOSIS — Z8701 Personal history of pneumonia (recurrent): Secondary | ICD-10-CM | POA: Insufficient documentation

## 2011-05-11 DIAGNOSIS — K219 Gastro-esophageal reflux disease without esophagitis: Secondary | ICD-10-CM | POA: Insufficient documentation

## 2011-05-11 DIAGNOSIS — K7460 Unspecified cirrhosis of liver: Secondary | ICD-10-CM | POA: Insufficient documentation

## 2011-05-11 DIAGNOSIS — R091 Pleurisy: Secondary | ICD-10-CM

## 2011-05-11 DIAGNOSIS — M549 Dorsalgia, unspecified: Secondary | ICD-10-CM | POA: Insufficient documentation

## 2011-05-11 DIAGNOSIS — I8500 Esophageal varices without bleeding: Secondary | ICD-10-CM | POA: Insufficient documentation

## 2011-05-11 DIAGNOSIS — F17200 Nicotine dependence, unspecified, uncomplicated: Secondary | ICD-10-CM | POA: Insufficient documentation

## 2011-05-11 DIAGNOSIS — R071 Chest pain on breathing: Secondary | ICD-10-CM | POA: Insufficient documentation

## 2011-05-11 DIAGNOSIS — R1012 Left upper quadrant pain: Secondary | ICD-10-CM | POA: Insufficient documentation

## 2011-05-11 DIAGNOSIS — E119 Type 2 diabetes mellitus without complications: Secondary | ICD-10-CM | POA: Insufficient documentation

## 2011-05-11 HISTORY — DX: Depression, unspecified: F32.A

## 2011-05-11 HISTORY — DX: Major depressive disorder, single episode, unspecified: F32.9

## 2011-05-11 HISTORY — DX: Asymptomatic varicose veins of unspecified lower extremity: I83.90

## 2011-05-11 HISTORY — DX: Essential (primary) hypertension: I10

## 2011-05-11 HISTORY — DX: Unspecified cirrhosis of liver: K74.60

## 2011-05-11 LAB — D-DIMER, QUANTITATIVE: D-Dimer, Quant: 0.33 ug/mL-FEU (ref 0.00–0.48)

## 2011-05-11 LAB — BASIC METABOLIC PANEL
CO2: 24 mEq/L (ref 19–32)
Chloride: 99 mEq/L (ref 96–112)
Creatinine, Ser: 0.87 mg/dL (ref 0.50–1.35)
GFR calc Af Amer: >90 mL/min (ref >90)
Sodium: 135 mEq/L (ref 135–145)

## 2011-05-11 LAB — DIFFERENTIAL
Basophils Absolute: 0 10*3/uL (ref 0.0–0.1)
Basophils Relative: 0 % (ref 0–1)
Eosinophils Absolute: 0.3 K/uL (ref 0.0–0.7)
Eosinophils Relative: 4 % (ref 0–5)
Lymphocytes Relative: 27 % (ref 12–46)
Lymphs Abs: 2.1 K/uL (ref 0.7–4.0)
Monocytes Absolute: 0.5 10*3/uL (ref 0.1–1.0)
Monocytes Relative: 6 % (ref 3–12)
Neutro Abs: 4.8 10*3/uL (ref 1.7–7.7)
Neutrophils Relative %: 63 % (ref 43–77)

## 2011-05-11 LAB — HEPATIC FUNCTION PANEL
ALT: 57 U/L — ABNORMAL HIGH (ref 0–53)
AST: 24 U/L (ref 0–37)
Albumin: 3.7 g/dL (ref 3.5–5.2)
Alkaline Phosphatase: 148 U/L — ABNORMAL HIGH (ref 39–117)
Bilirubin, Direct: 0.1 mg/dL (ref 0.0–0.3)
Indirect Bilirubin: 0.2 mg/dL — ABNORMAL LOW (ref 0.3–0.9)
Total Bilirubin: 0.3 mg/dL (ref 0.3–1.2)
Total Protein: 7.4 g/dL (ref 6.0–8.3)

## 2011-05-11 LAB — CBC
HCT: 47.8 % (ref 39.0–52.0)
Hemoglobin: 16.5 g/dL (ref 13.0–17.0)
MCH: 33.7 pg (ref 26.0–34.0)
MCHC: 34.5 g/dL (ref 30.0–36.0)
MCV: 97.8 fL (ref 78.0–100.0)
Platelets: 246 10*3/uL (ref 150–400)
RBC: 4.89 MIL/uL (ref 4.22–5.81)
RDW: 12.7 % (ref 11.5–15.5)
WBC: 7.7 10*3/uL (ref 4.0–10.5)

## 2011-05-11 LAB — BASIC METABOLIC PANEL WITH GFR
BUN: 22 mg/dL (ref 6–23)
Calcium: 9.9 mg/dL (ref 8.4–10.5)
GFR calc non Af Amer: >90 mL/min (ref >90)
Glucose, Bld: 243 mg/dL — ABNORMAL HIGH (ref 70–99)
Potassium: 4.2 meq/L (ref 3.5–5.1)

## 2011-05-11 LAB — LIPASE, BLOOD: Lipase: 37 U/L (ref 11–59)

## 2011-05-11 MED ORDER — IOHEXOL 350 MG/ML SOLN
100.0000 mL | Freq: Once | INTRAVENOUS | Status: AC | PRN
  Administered 2011-05-11: 100 mL via INTRAVENOUS

## 2011-05-11 MED ORDER — METOCLOPRAMIDE HCL 10 MG PO TABS: 10.0000 mg | ORAL_TABLET | Freq: Four times a day (QID) | ORAL | Status: DC

## 2011-05-11 MED ORDER — HYDROMORPHONE HCL PF 1 MG/ML IJ SOLN
1.0000 mg | Freq: Once | INTRAMUSCULAR | Status: AC
  Administered 2011-05-11: 1 mg via INTRAVENOUS
  Filled 2011-05-11: qty 1

## 2011-05-11 MED ORDER — GI COCKTAIL ~~LOC~~
30.0000 mL | Freq: Once | ORAL | Status: AC
  Administered 2011-05-11: 30 mL via ORAL
  Filled 2011-05-11: qty 30

## 2011-05-11 MED ORDER — GI COCKTAIL ~~LOC~~: 30.0000 mL | Freq: Once | ORAL | Status: DC

## 2011-05-11 MED ORDER — SODIUM CHLORIDE 0.9 % IV SOLN
Freq: Once | INTRAVENOUS | Status: AC
  Administered 2011-05-11: 14:00:00 via INTRAVENOUS

## 2011-05-11 MED ORDER — METOCLOPRAMIDE HCL 10 MG PO TABS: 10.0000 mg | ORAL_TABLET | Freq: Four times a day (QID) | ORAL | Status: AC

## 2011-05-11 MED ORDER — METOCLOPRAMIDE HCL 5 MG/ML IJ SOLN
10.0000 mg | Freq: Once | INTRAMUSCULAR | Status: AC
  Administered 2011-05-11: 10 mg via INTRAVENOUS
  Filled 2011-05-11: qty 2

## 2011-05-11 NOTE — Discharge Instructions (Signed)
Pleurisy Pleurisy is an inflammation and swelling of the lining of the lungs. It usually is the result of an underlying infection or other disease. Because of this inflammation, it hurts to breathe. It is aggravated by coughing or deep breathing. The primary goal in treating pleurisy is to diagnose and treat the condition that caused it.  HOME CARE INSTRUCTIONS   Only take over-the-counter or prescription medicines for pain, discomfort, or fever as directed by your caregiver.   If medications which kill germs (antibiotics) were prescribed, take the entire course. Even if you are feeling better, you need to take them.   Use a cool mist vaporizer to help loosen secretions. This is so the secretions can be coughed up more easily.  SEEK MEDICAL CARE IF:   Your pain is not controlled with medication or is increasing.   You have an increase inpus like (purulent) secretions brought up with coughing.  SEEK IMMEDIATE MEDICAL CARE IF:   You have blue or dark lips, fingernails, or toenails.   You begin coughing up blood.   You have increased difficulty breathing.   You have continuing pain unrelieved by medicine or lasting more than 1 week.   You have pain that radiates into your neck, arms, or jaw.   You develop increased shortness of breath or wheezing.   You develop a fever, rash, vomiting, fainting, or other serious complaints.  Document Released: 03/28/2005 Document Revised: 12/08/2010 Document Reviewed: 10/27/2006 [employer] Patient Information 2012 [employer]. 

## 2011-05-11 NOTE — ED Provider Notes (Signed)
History     CSN: [id number]  Arrival date & time 05/11/11  1040   First MD Initiated Contact with Patient 05/11/11 1305      Chief Complaint  Patient presents with  . Abdominal Pain    (Consider location/radiation/quality/duration/timing/severity/associated sxs/prior treatment) HPI Comments: When I go into the room, the patient is playing solitaire on his ipad. He has a significant history of hypertension, diabetes and severe chronic back pains. He reports that he has a back stimulator in place and has known herniated discs in his cervical, thoracic and lumbar spine. Reports gradual onset of left upper quadrant and left lower rib pain that is worse with palpation, worse with deep breath and coughing. He reports that he has a mild chronic cough related to his smoking. He no longer drinks alcohol. He does take medication for reflux reports that this does not feel like reflux. He does occasionally take Percocets for exacerbations of back pain and has taken a few which minimally improved his abdominal pain. He reports that he stopped taking the Percocet because it was not significantly improving his abdominal pain. He denies nausea vomiting and diarrhea. He reports that he feels much more bloated and distended than usual. He reports that he is passing normal flatus and had a normal bowel movement today and yesterday. He reports appetite is slightly down, and eating does not seem to make the pain worse. He reports that he has had pneumonia in many many years ago. He denies history of coronary disease or strokes. He denies any acute trauma or falls. Patient reports that he did have an episode of chest pain many years ago, but was found to be chest pain do to referred pain from his thoracic back.he denies skin rash, dysuria, urinary frequency. He denies scrotal or testicular discomfort.  Patient is a 61 y.o. male presenting with abdominal pain. The history is provided by the patient.  Abdominal Pain The  primary symptoms of the illness include abdominal pain.    Past Medical History  Diagnosis Date  . Hypertension   . Diabetes mellitus   . Cirrhosis   . Depressed   . Varicose vein     of esophagus    Past Surgical History  Procedure Date  . Back surgery   . Spinal cord stimulator implant   . Cholecystectomy     Family History  Problem Relation Age of Onset  . Diabetes Neg Hx     History  Substance Use Topics  . Smoking status: Current Everyday Smoker  . Smokeless tobacco: Not on file  . Alcohol Use: No     quit etoh 3 years ago      Review of Systems  Gastrointestinal: Positive for abdominal pain.  All other systems reviewed and are negative.    Allergies  Nitroglycerin  Home Medications   Current Outpatient Rx  Name Route Sig Dispense Refill  . BUPROPION HCL ER (SR) 150 MG PO TB12 Oral Take 150 mg by mouth 2 (two) times daily.    . INSULIN GLARGINE 100 UNIT/ML [place] SOLN Subcutaneous Inject 15 Units into the skin every morning.    Marland Kitchen LISINOPRIL 10 MG PO TABS Oral Take 10 mg by mouth every morning.    Marland Kitchen PANTOPRAZOLE SODIUM 40 MG PO TBEC Oral Take 40 mg by mouth every morning.    Marland Kitchen PIOGLITAZONE HCL-METFORMIN HCL 15-850 MG PO TABS Oral Take 1 tablet by mouth 2 (two) times daily.      BP 125/64  Pulse 102  Temp(Src) 98.1 F (36.7 C) (Oral)  Resp 23  Ht 5\' 10"  (1.778 m)  Wt 260 lb (117.935 kg)  BMI 37.31 kg/m2  SpO2 90%  Physical Exam  Nursing note and vitals reviewed. Constitutional: He is oriented to person, place, and time. He appears well-developed and well-nourished. No distress.  HENT:  Head: Normocephalic.  Eyes: Pupils are equal, round, and reactive to light. No scleral icterus.  Cardiovascular: Normal rate.   Pulmonary/Chest: Effort normal and breath sounds normal. No respiratory distress. He has no wheezes. He has no rales.    Abdominal: Soft. Normal appearance and bowel sounds are normal. He exhibits no mass. There is tenderness. There is  guarding. There is no rebound, no tenderness at McBurney's point and negative Murphy's sign.    Neurological: He is alert and oriented to person, place, and time.  Skin: Skin is warm. No rash noted. He is not diaphoretic.  Psychiatric: He has a normal mood and affect.    ED Course  Procedures (including critical care time)  Labs Reviewed  BASIC METABOLIC PANEL - Abnormal; Notable for the following:    Glucose, Bld 243 (*)    All other components within normal limits  HEPATIC FUNCTION PANEL - Abnormal; Notable for the following:    ALT 57 (*)    Alkaline Phosphatase 148 (*)    Indirect Bilirubin 0.2 (*)    All other components within normal limits  CBC  DIFFERENTIAL  D-DIMER, QUANTITATIVE  LIPASE, BLOOD   Dg Ribs Unilateral W/chest Left  05/11/2011  *RADIOLOGY REPORT*  Clinical Data: All pain.  Left flank pain.  Pleurisy and cough.  LEFT RIBS AND CHEST - 3+ VIEW  Comparison: 05/15/2009 chest CT.  Findings: Thoracic spinal stimulator is present.  Emphysematous changes are present in the lungs.  Bilateral pleural apical scarring.  There is no airspace disease or effusion.  No displaced rib fractures are identified. Please note that plain film rib series have limited sensitivity for nondisplaced rib fractures.  If the patient continues to have pain, consider repeat examination in 1-2 weeks with marker over area of maximal tenderness.  Often periosteal reaction will be present at the site of occult rib fracture.  IMPRESSION: No acute cardiopulmonary disease.  No displaced rib fracture or pneumothorax.  Original Report Authenticated By: Andreas Newport, [name]   Ct Angio Chest W/cm &/or Wo Cm  05/11/2011  *RADIOLOGY REPORT*  Clinical Data: Pleuritic chest pain.  Cough.  CT ANGIOGRAPHY CHEST  Technique:  Multidetector CT imaging of the chest using the standard protocol during bolus administration of intravenous contrast. Multiplanar reconstructed images including MIPs were obtained and reviewed to  evaluate the vascular anatomy.  Contrast: OMNIPAQUE IOHEXOL 350 MG/ML IV SOLN  Comparison: 05/15/2009  Findings: No filling defects in the pulmonary arteries to suggest pulmonary emboli.  Borderline sized subpleural, AP window, and bilateral hilar lymph nodes are stable since prior study.  Moderate COPD changes.  Stable nodules in the right upper lobe.  No new or enlarging pulmonary nodules.  No pleural effusions.  Heart is normal size.  Aorta is normal caliber. Visualized thyroid and chest wall soft tissues unremarkable. Imaging into the upper abdomen shows no acute findings.  No acute bony abnormality.  Spinal stimulator wires noted in the mid to lower thoracic spine region.  Degenerative changes in the mid to lower thoracic spine.  IMPRESSION: No evidence of pulmonary embolus.  COPD.  Stable right apical nodules compatible with scar.  Stable borderline  sized mediastinal and bilateral hilar lymph nodes.  These are likely reactive.  Original Report Authenticated By: Cyndie Chime, [name]     No diagnosis found.  Room air saturation goes between 91-95% depending on if he is coughing or speaking he denies dyspnea. Patient has a history of smoking. I interpret this to be a low normal value for him.  ECG obtained at 11:43, shows normal sinus rhythm at a rate of 97. Normal axis, normal intervals and no ST or T-wave abnormalities. It appears to be unchanged from an EKG obtained 08/26/2005.    3:28 PM Patient remains slightly tachycardic at a rate between 95-110. His room air saturations remain borderline low in the 90-94 range. Despite his d-dimer being negative, I suspect that we will have to do a CT anterior to rule out a pulmonary embolism given his presentation with cough and pleuritic left lower side chest and upper abdominal pain. His lipase and electrolytes panel and LFTs were either normal or at his usual baseline.  4:33 PM I discussed the findings of the CT scan and x-rays with the patient.  He is reassured blood tests look okay. He also tells me that his primary care office is also aware that his oxygen levels have gradually been decreasing. I have discussed about him being on oxygen. However reminded him that more likely what needs to happen is that he needs to quit smoking before anyone considers putting him on oxygen. There are no PEs on chest CT scan. I did review it myself. The patient reports that the initial dose of analgesics did improve his pain somewhat. I plan is to give him another dose as well as a GI cocktail to see if pain is improved more. Also give him a dose of Reglan for motility. If he or she is some good pain relief, I feel the patient is stable to be discharged home and can followup with his primary care physician next week.   5:35 PM Pt reports he feels improved, no longer tachycardic.  Sats remain stable although in low normal range.  He feels comfortable going home.  I have urged him to return if worse, to follow up with Dr. Regino Schultze by next week, and I have encouraged him to cease smoking.  He has analgesics at home already.    MDM   We'll give IV analgesics, obtain routine blood tests including lipase and LFTs. Given the pleuritic component, will get a d-dimer as I have low risk for PE. However since he smokes and has been coughing, simple pneumonia is also on the differentiall and will obtain a chest x-ray.       Gavin Pound. Rynell Ciotti, MD 05/11/11 1736

## 2011-05-11 NOTE — ED Notes (Signed)
LUQ pain, nausea, no vomiting , no diarrhea

## 2011-12-28 HISTORY — PX: US ECHOCARDIOGRAPHY: HXRAD669

## 2012-01-06 ENCOUNTER — Inpatient Hospital Stay (HOSPITAL_COMMUNITY): Payer: Medicare Other

## 2012-01-06 ENCOUNTER — Inpatient Hospital Stay (HOSPITAL_COMMUNITY)
Admission: AD | Admit: 2012-01-06 | Discharge: 2012-01-10 | DRG: 242 | Disposition: A | Discharge status: Home / Self Care | Payer: Medicare Other | Source: Ambulatory Visit | Attending: Cardiovascular Disease | Admitting: Cardiovascular Disease

## 2012-01-06 ENCOUNTER — Encounter (HOSPITAL_COMMUNITY): Payer: Self-pay | Admitting: General Practice

## 2012-01-06 DIAGNOSIS — M1990 Unspecified osteoarthritis, unspecified site: Secondary | ICD-10-CM | POA: Diagnosis present

## 2012-01-06 DIAGNOSIS — I495 Sick sinus syndrome: Principal | ICD-10-CM | POA: Diagnosis present

## 2012-01-06 DIAGNOSIS — E119 Type 2 diabetes mellitus without complications: Secondary | ICD-10-CM | POA: Diagnosis present

## 2012-01-06 DIAGNOSIS — J4489 Other specified chronic obstructive pulmonary disease: Secondary | ICD-10-CM | POA: Diagnosis present

## 2012-01-06 DIAGNOSIS — E669 Obesity, unspecified: Secondary | ICD-10-CM | POA: Diagnosis present

## 2012-01-06 DIAGNOSIS — I10 Essential (primary) hypertension: Secondary | ICD-10-CM | POA: Diagnosis present

## 2012-01-06 DIAGNOSIS — F329 Major depressive disorder, single episode, unspecified: Secondary | ICD-10-CM | POA: Diagnosis present

## 2012-01-06 DIAGNOSIS — E1165 Type 2 diabetes mellitus with hyperglycemia: Secondary | ICD-10-CM | POA: Diagnosis present

## 2012-01-06 DIAGNOSIS — I455 Other specified heart block: Secondary | ICD-10-CM | POA: Diagnosis present

## 2012-01-06 DIAGNOSIS — K219 Gastro-esophageal reflux disease without esophagitis: Secondary | ICD-10-CM | POA: Diagnosis present

## 2012-01-06 DIAGNOSIS — F3289 Other specified depressive episodes: Secondary | ICD-10-CM | POA: Diagnosis present

## 2012-01-06 DIAGNOSIS — I441 Atrioventricular block, second degree: Secondary | ICD-10-CM | POA: Diagnosis present

## 2012-01-06 DIAGNOSIS — R55 Syncope and collapse: Secondary | ICD-10-CM | POA: Diagnosis present

## 2012-01-06 DIAGNOSIS — F17200 Nicotine dependence, unspecified, uncomplicated: Secondary | ICD-10-CM | POA: Diagnosis present

## 2012-01-06 DIAGNOSIS — I5031 Acute diastolic (congestive) heart failure: Secondary | ICD-10-CM | POA: Diagnosis present

## 2012-01-06 DIAGNOSIS — M479 Spondylosis, unspecified: Secondary | ICD-10-CM | POA: Diagnosis present

## 2012-01-06 DIAGNOSIS — G4734 Idiopathic sleep related nonobstructive alveolar hypoventilation: Secondary | ICD-10-CM | POA: Diagnosis present

## 2012-01-06 DIAGNOSIS — IMO0002 Reserved for concepts with insufficient information to code with codable children: Secondary | ICD-10-CM | POA: Diagnosis present

## 2012-01-06 DIAGNOSIS — Z79899 Other long term (current) drug therapy: Secondary | ICD-10-CM

## 2012-01-06 DIAGNOSIS — G4733 Obstructive sleep apnea (adult) (pediatric): Secondary | ICD-10-CM | POA: Diagnosis present

## 2012-01-06 DIAGNOSIS — K7030 Alcoholic cirrhosis of liver without ascites: Secondary | ICD-10-CM | POA: Diagnosis present

## 2012-01-06 DIAGNOSIS — I5033 Acute on chronic diastolic (congestive) heart failure: Secondary | ICD-10-CM | POA: Diagnosis present

## 2012-01-06 DIAGNOSIS — Z794 Long term (current) use of insulin: Secondary | ICD-10-CM

## 2012-01-06 DIAGNOSIS — J449 Chronic obstructive pulmonary disease, unspecified: Secondary | ICD-10-CM | POA: Diagnosis present

## 2012-01-06 DIAGNOSIS — I509 Heart failure, unspecified: Secondary | ICD-10-CM

## 2012-01-06 HISTORY — DX: Low back pain, unspecified: M54.50

## 2012-01-06 HISTORY — DX: Gastro-esophageal reflux disease without esophagitis: K21.9

## 2012-01-06 HISTORY — DX: Hemoptysis: R04.2

## 2012-01-06 HISTORY — DX: Fatty (change of) liver, not elsewhere classified: K76.0

## 2012-01-06 HISTORY — DX: Emphysema, unspecified: J43.9

## 2012-01-06 HISTORY — DX: Other specified heart block: I45.5

## 2012-01-06 HISTORY — DX: Low back pain: M54.5

## 2012-01-06 HISTORY — DX: Unspecified osteoarthritis, unspecified site: M19.90

## 2012-01-06 HISTORY — DX: Other chronic pain: G89.29

## 2012-01-06 HISTORY — DX: Type 2 diabetes mellitus without complications: E11.9

## 2012-01-06 HISTORY — DX: Heart failure, unspecified: I50.9

## 2012-01-06 HISTORY — DX: Personal history of other diseases of the digestive system: Z87.19

## 2012-01-06 HISTORY — DX: Sleep apnea, unspecified: G47.30

## 2012-01-06 HISTORY — DX: Angina pectoris, unspecified: I20.9

## 2012-01-06 LAB — CBC
HCT: 47.3 % (ref 39.0–52.0)
Hemoglobin: 16.2 g/dL (ref 13.0–17.0)
MCH: 33.5 pg (ref 26.0–34.0)
MCHC: 34.2 g/dL (ref 30.0–36.0)
MCV: 97.7 fL (ref 78.0–100.0)
Platelets: 294 10*3/uL (ref 150–400)
RBC: 4.84 MIL/uL (ref 4.22–5.81)
RDW: 13.4 % (ref 11.5–15.5)
WBC: 9.5 10*3/uL (ref 4.0–10.5)

## 2012-01-06 LAB — GLUCOSE, CAPILLARY: Glucose-Capillary: 140 mg/dL — ABNORMAL HIGH (ref 70–99)

## 2012-01-06 LAB — URINALYSIS, ROUTINE W REFLEX MICROSCOPIC
Bilirubin Urine: NEGATIVE
Glucose, UA: NEGATIVE mg/dL
Ketones, ur: NEGATIVE mg/dL
Leukocytes, UA: NEGATIVE
Nitrite: NEGATIVE
Protein, ur: NEGATIVE mg/dL
Specific Gravity, Urine: 1.014 (ref 1.005–1.030)
Urobilinogen, UA: 0.2 mg/dL (ref 0.0–1.0)
pH: 5.5 (ref 5.0–8.0)

## 2012-01-06 LAB — URINE MICROSCOPIC-ADD ON

## 2012-01-06 LAB — TROPONIN I: Troponin I: <0.3 ng/mL (ref <0.30)

## 2012-01-06 MED ORDER — INSULIN ASPART 100 UNIT/ML ~~LOC~~ SOLN
0.0000 [IU] | Freq: Three times a day (TID) | SUBCUTANEOUS | Status: DC
  Administered 2012-01-06 – 2012-01-07 (×2): 2 [IU] via SUBCUTANEOUS
  Administered 2012-01-07: 5 [IU] via SUBCUTANEOUS
  Administered 2012-01-08 (×3): 2 [IU] via SUBCUTANEOUS
  Administered 2012-01-09 – 2012-01-10 (×2): 3 [IU] via SUBCUTANEOUS

## 2012-01-06 MED ORDER — ZOLPIDEM TARTRATE 5 MG PO TABS: 5.0000 mg | ORAL_TABLET | Freq: Every evening | ORAL | Status: DC | PRN

## 2012-01-06 MED ORDER — BUPROPION HCL ER (SR) 150 MG PO TB12
150.0000 mg | ORAL_TABLET | Freq: Two times a day (BID) | ORAL | Status: DC
  Administered 2012-01-06 – 2012-01-10 (×8): 150 mg via ORAL
  Filled 2012-01-06 (×10): qty 1

## 2012-01-06 MED ORDER — LISINOPRIL 10 MG PO TABS
10.0000 mg | ORAL_TABLET | Freq: Every day | ORAL | Status: DC
  Administered 2012-01-07 – 2012-01-10 (×4): 10 mg via ORAL
  Filled 2012-01-06 (×4): qty 1

## 2012-01-06 MED ORDER — PIOGLITAZONE HCL-METFORMIN HCL 15-850 MG PO TABS: 1.0000 | ORAL_TABLET | Freq: Two times a day (BID) | ORAL | Status: DC

## 2012-01-06 MED ORDER — INSULIN ASPART 100 UNIT/ML ~~LOC~~ SOLN: 0.0000 [IU] | Freq: Every day | SUBCUTANEOUS | Status: DC

## 2012-01-06 MED ORDER — ACETAMINOPHEN 325 MG PO TABS: 650.0000 mg | ORAL_TABLET | ORAL | Status: DC | PRN

## 2012-01-06 MED ORDER — INSULIN GLARGINE 100 UNIT/ML ~~LOC~~ SOLN
15.0000 [IU] | Freq: Every day | SUBCUTANEOUS | Status: DC
  Administered 2012-01-07 – 2012-01-10 (×4): 15 [IU] via SUBCUTANEOUS

## 2012-01-06 MED ORDER — PANTOPRAZOLE SODIUM 40 MG PO TBEC
40.0000 mg | DELAYED_RELEASE_TABLET | Freq: Every day | ORAL | Status: DC
  Administered 2012-01-07 – 2012-01-10 (×4): 40 mg via ORAL
  Filled 2012-01-06 (×4): qty 1

## 2012-01-06 MED ORDER — PIOGLITAZONE HCL 15 MG PO TABS
15.0000 mg | ORAL_TABLET | Freq: Two times a day (BID) | ORAL | Status: DC
  Administered 2012-01-06 – 2012-01-07 (×2): 15 mg via ORAL
  Filled 2012-01-06 (×4): qty 1

## 2012-01-06 NOTE — H&P (Signed)
Patient ID: Ralph [name] MRN: [id number], DOB/AGE: 04-24-1950   Admit date: 01/06/2012   Primary Physician: [name], MD Primary Cardiologist: Dr Alanda Amass  HPI: 61 y/o pt from [place], seen by our group in the past. He has had chest pain, low risk nuclear study in 2006. He has obesity, OSA (not on C-Pap), IDDM, chronic back pain. He saw Dr Alanda Amass in the office 12/13/11 after an episode of syncope and an episode of near syncope. Some of these episodes sounded like "cough syncope". An echo was done 12/28/11 and was essentially normal. A Holter monitor was placed and the pt was reported to have a 5.2 second pause last night. He is admitted from the office today for further evaluation. See Dr Kandis Cocking office note from 01/06/12 for complete details.   Problem List: Past Medical History  Diagnosis Date  . Hypertension   . Diabetes mellitus   . Cirrhosis   . Depressed   . Varicose vein     of esophagus    Past Surgical History  Procedure Date  . Back surgery   . Spinal cord stimulator implant   . Cholecystectomy      Allergies:  Allergies  Allergen Reactions  . Nitroglycerin Hives, Swelling and Rash     Home Medications Prescriptions prior to admission  Medication Sig Dispense Refill  . buPROPion (WELLBUTRIN SR) 150 MG 12 hr tablet Take 150 mg by mouth 2 (two) times daily.      . insulin glargine (LANTUS SOLOSTAR) 100 UNIT/ML injection Inject 15 Units into the skin every morning.      Marland Kitchen lisinopril (PRINIVIL,ZESTRIL) 10 MG tablet Take 10 mg by mouth every morning.      . pantoprazole (PROTONIX) 40 MG tablet Take 40 mg by mouth every morning.      . pioglitazone-metformin (ACTOPLUS MET) 15-850 MG per tablet Take 1 tablet by mouth 2 (two) times daily.         Family History  Problem Relation Age of Onset  . Diabetes Neg Hx      History   Social History  . Marital Status: Married    Spouse Name: N/A    Number of Children: N/A  . Years of Education: N/A    Occupational History  . Not on file.   Social History Main Topics  . Smoking status: Current Every Day Smoker  . Smokeless tobacco: Not on file  . Alcohol Use: No     quit etoh 3 years ago  . Drug Use: No  . Sexually Active:    Other Topics Concern  . Not on file   Social History Narrative  . No narrative on file     Review of Systems: General: negative for chills, fever, night sweats or weight changes.  Cardiovascular: negative for chest pain, dyspnea on exertion, edema, orthopnea, palpitations, paroxysmal nocturnal dyspnea or shortness of breath Dermatological: negative for rash Respiratory: negative for cough or wheezing Urologic: negative for hematuria Abdominal: negative for nausea, vomiting, diarrhea, bright red blood per rectum, melena, or hematemesis Neurologic: negative for visual changes, syncope, or dizziness All other systems reviewed and are otherwise negative except as noted above.  Physical Exam: There were no vitals taken for this visit.  General appearance: alert, cooperative, no distress and moderately obese Neck: no adenopathy, no carotid bruit, no JVD, supple, symmetrical, trachea midline and thyroid not enlarged, symmetric, no tenderness/mass/nodules Lungs: clear to auscultation bilaterally Heart: regular rate and rhythm Abdomen: soft, non-tender; bowel sounds normal; no masses,  no organomegaly Extremities: extremities normal, atraumatic, no cyanosis or edema Pulses: 2+ and symmetric Skin: Skin color, texture, turgor normal. No rashes or lesions Neurologic: Grossly normal    Labs:  No results found for this or any previous visit (from the past 24 hour(s)).   Radiology/Studies: No results found.  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 ?Pacemaker Monday. See Dr Dario Ave note, the pt does have a spinal stimulator implanted and this may need to be removed prior to pacemaker implant. No meds to stop that would affect HR. Will discuss with Dr Royann Shivers  Signed, Abelino Derrick, PA-C 01/06/2012, 2:28 PM  I have seen and examined the patient along with Corine Shelter PA-C.  I have reviewed the chart, notes and new data.  I agree with PA's note.  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 He also has untreated OSA and likely has COPD. By exam has evidence of right heart failure.   Needs a dual chamber permanent pacemaker, but unfortunately he ate a full lunch recently.  Spinal stimulator has been off for months. If a replacement is implanted in the future, will need careful testing for interference.  PLAN: Dual chamber PPM Monday. This procedure has been fully reviewed with the patient and informed consent has been obtained.   Thurmon Fair, MD, [facility] [phone] 01/06/2012, 3:51 PM

## 2012-01-07 LAB — COMPREHENSIVE METABOLIC PANEL
ALT: 55 U/L — ABNORMAL HIGH (ref 0–53)
AST: 30 U/L (ref 0–37)
Albumin: 3.7 g/dL (ref 3.5–5.2)
Alkaline Phosphatase: 145 U/L — ABNORMAL HIGH (ref 39–117)
BUN: 17 mg/dL (ref 6–23)
CO2: 27 mEq/L (ref 19–32)
Calcium: 9.6 mg/dL (ref 8.4–10.5)
Chloride: 99 mEq/L (ref 96–112)
Creatinine, Ser: 0.84 mg/dL (ref 0.50–1.35)
GFR calc Af Amer: >90 mL/min (ref >90)
GFR calc non Af Amer: >90 mL/min (ref >90)
Glucose, Bld: 154 mg/dL — ABNORMAL HIGH (ref 70–99)
Potassium: 4.5 mEq/L (ref 3.5–5.1)
Sodium: 137 mEq/L (ref 135–145)
Total Bilirubin: 0.6 mg/dL (ref 0.3–1.2)
Total Protein: 7.3 g/dL (ref 6.0–8.3)

## 2012-01-07 LAB — GLUCOSE, CAPILLARY
Glucose-Capillary: 139 mg/dL — ABNORMAL HIGH (ref 70–99)
Glucose-Capillary: 203 mg/dL — ABNORMAL HIGH (ref 70–99)
Glucose-Capillary: 119 mg/dL — ABNORMAL HIGH (ref 70–99)

## 2012-01-07 LAB — TSH: TSH: 2.093 u[IU]/mL (ref 0.350–4.500)

## 2012-01-07 LAB — HEMOGLOBIN A1C
Hgb A1c MFr Bld: 6.7 % — ABNORMAL HIGH (ref <5.7)
Mean Plasma Glucose: 146 mg/dL — ABNORMAL HIGH (ref <117)

## 2012-01-07 MED ORDER — NICOTINE 14 MG/24HR TD PT24
14.0000 mg | MEDICATED_PATCH | Freq: Every day | TRANSDERMAL | Status: DC
  Administered 2012-01-07 – 2012-01-10 (×4): 14 mg via TRANSDERMAL
  Filled 2012-01-07 (×4): qty 1

## 2012-01-07 MED ORDER — METFORMIN HCL 850 MG PO TABS
850.0000 mg | ORAL_TABLET | Freq: Two times a day (BID) | ORAL | Status: DC
  Administered 2012-01-07 – 2012-01-10 (×5): 850 mg via ORAL
  Filled 2012-01-07 (×8): qty 1

## 2012-01-07 MED ORDER — FUROSEMIDE 40 MG PO TABS
40.0000 mg | ORAL_TABLET | Freq: Every day | ORAL | Status: DC
  Administered 2012-01-07 – 2012-01-08 (×2): 40 mg via ORAL
  Filled 2012-01-07 (×2): qty 1

## 2012-01-07 MED ORDER — PIOGLITAZONE HCL 15 MG PO TABS
15.0000 mg | ORAL_TABLET | Freq: Two times a day (BID) | ORAL | Status: DC
  Administered 2012-01-07 – 2012-01-10 (×5): 15 mg via ORAL
  Filled 2012-01-07 (×8): qty 1

## 2012-01-07 NOTE — Progress Notes (Signed)
[facility]  DAILY PROGRESS NOTE   Subjective:  No complaints except maybe nicotine w/d symptoms  Objective:  Temp:  [97.7 F (36.5 C)-98.7 F (37.1 C)] 97.9 F (36.6 C) (09/28 0530) Pulse Rate:  [83-90] 85  (09/28 0530) Resp:  [18-20] 20  (09/28 0530) BP: (121-136)/(45-64) 126/64 mmHg (09/28 0530) SpO2:  [94 %-96 %] 96 % (09/28 0530) Weight change:   Intake/Output from previous day:    Intake/Output from this shift:    Medications: Current Facility-Administered Medications  Medication Dose Route Frequency Provider Last Rate Last Dose  . acetaminophen (TYLENOL) tablet 650 mg  650 mg Oral Q4H PRN Abelino Derrick, PA      . buPROPion [facility] SR) 12 hr tablet 150 mg  150 mg Oral BID Eda Paschal [place], PA   150 mg at 01/07/12 1045  . furosemide (LASIX) tablet 40 mg  40 mg Oral Daily Canden Cieslinski, MD      . insulin aspart (novoLOG) injection 0-15 Units  0-15 Units Subcutaneous TID WC Abelino Derrick, PA   2 Units at 01/07/12 0800  . insulin aspart (novoLOG) injection 0-5 Units  0-5 Units Subcutaneous QHS Eda Paschal [place], [place]      . insulin glargine (LANTUS) injection 15 Units  15 Units Subcutaneous Daily Eda Paschal [place], [place]      . lisinopril (PRINIVIL,ZESTRIL) tablet 10 mg  10 mg Oral Daily Eda Paschal [place], [place]   10 mg at 01/07/12 1045  . nicotine (NICODERM CQ - dosed in mg/24 hours) patch 14 mg  14 mg Transdermal Daily Madigan Rosensteel, MD      . pantoprazole (PROTONIX) EC tablet 40 mg  40 mg Oral Q1200 Abelino Derrick, [place]      . pioglitazone (ACTOS) tablet 15 mg  15 mg Oral BID WC Governor Rooks, MD   15 mg at 01/07/12 0800  . zolpidem (AMBIEN) tablet 5 mg  5 mg Oral QHS PRN Abelino Derrick, PA      . DISCONTD: pioglitazone-metformin (ACTOPLUS MET) 15-850 MG per tablet 1 tablet  1 tablet Oral BID Abelino Derrick, PA      . DISCONTD: pioglitazone-metformin (ACTOPLUS MET) 15-850 MG per tablet 1 tablet  1 tablet Oral BID Governor Rooks, MD        Physical  Exam: General appearance: alert, cooperative, no distress and moderately obese  Neck: no adenopathy, no carotid bruit, no JVD, supple, symmetrical, trachea midline and thyroid not enlarged, symmetric, no tenderness/mass/nodules  Lungs: clear to auscultation bilaterally  Heart: regular rate and rhythm  Abdomen: soft, non-tender; bowel sounds normal; no masses, no organomegaly  Extremities: extremities normal, atraumatic, no cyanosis or edema  Pulses: 2+ and symmetric  Skin: Skin color, texture, turgor normal. No rashes or lesions  Neurologic: Grossly normal  Lab Results: Results for orders placed during the hospital encounter of 01/06/12 (from the past 48 hour(s))  CBC     Status: Normal   Collection Time   01/06/12  3:51 PM      Component Value Range Comment   WBC 9.5  4.0 - 10.5 K/uL    RBC 4.84  4.22 - 5.81 MIL/uL    Hemoglobin 16.2  13.0 - 17.0 g/dL    HCT 16.1  09.6 - 04.5 %    MCV 97.7  78.0 - 100.0 fL    MCH 33.5  26.0 - 34.0 pg    MCHC 34.2  30.0 - 36.0 g/dL  RDW 13.4  11.5 - 15.5 %    Platelets 294  150 - 400 K/uL   TSH     Status: Normal   Collection Time   01/06/12  3:51 PM      Component Value Range Comment   TSH 2.093  0.350 - 4.500 uIU/mL   HEMOGLOBIN A1C     Status: Abnormal   Collection Time   01/06/12  3:51 PM      Component Value Range Comment   Hemoglobin A1C 6.7 (*) <5.7 %    Mean Plasma Glucose 146 (*) <117 mg/dL   TROPONIN I     Status: Normal   Collection Time   01/06/12  3:52 PM      Component Value Range Comment   Troponin I <0.30  <0.30 ng/mL   GLUCOSE, CAPILLARY     Status: Abnormal   Collection Time   01/06/12  4:28 PM      Component Value Range Comment   Glucose-Capillary 125 (*) 70 - 99 mg/dL   URINALYSIS, ROUTINE W REFLEX MICROSCOPIC     Status: Abnormal   Collection Time   01/06/12  7:58 PM      Component Value Range Comment   Color, Urine YELLOW  YELLOW    APPearance CLOUDY (*) CLEAR    Specific Gravity, Urine 1.014  1.005 - 1.030     pH 5.5  5.0 - 8.0    Glucose, UA NEGATIVE  NEGATIVE mg/dL    Hgb urine dipstick SMALL (*) NEGATIVE    Bilirubin Urine NEGATIVE  NEGATIVE    Ketones, ur NEGATIVE  NEGATIVE mg/dL    Protein, ur NEGATIVE  NEGATIVE mg/dL    Urobilinogen, UA 0.2  0.0 - 1.0 mg/dL    Nitrite NEGATIVE  NEGATIVE    Leukocytes, UA NEGATIVE  NEGATIVE   URINE MICROSCOPIC-ADD ON     Status: Abnormal   Collection Time   01/06/12  7:58 PM      Component Value Range Comment   Squamous Epithelial / LPF FEW (*) RARE    WBC, UA 0-2  <3 WBC/hpf    RBC / HPF 0-2  <3 RBC/hpf    Bacteria, UA RARE  RARE   GLUCOSE, CAPILLARY     Status: Abnormal   Collection Time   01/06/12  8:23 PM      Component Value Range Comment   Glucose-Capillary 140 (*) 70 - 99 mg/dL    Comment 1 Notify RN     GLUCOSE, CAPILLARY     Status: Abnormal   Collection Time   01/07/12  7:26 AM      Component Value Range Comment   Glucose-Capillary 139 (*) 70 - 99 mg/dL   COMPREHENSIVE METABOLIC PANEL     Status: Abnormal   Collection Time   01/07/12  9:45 AM      Component Value Range Comment   Sodium 137  135 - 145 mEq/L    Potassium 4.5  3.5 - 5.1 mEq/L    Chloride 99  96 - 112 mEq/L    CO2 27  19 - 32 mEq/L    Glucose, Bld 154 (*) 70 - 99 mg/dL    BUN 17  6 - 23 mg/dL    Creatinine, Ser 1.61  0.50 - 1.35 mg/dL    Calcium 9.6  8.4 - 09.6 mg/dL    Total Protein 7.3  6.0 - 8.3 g/dL    Albumin 3.7  3.5 - 5.2 g/dL    AST  30  0 - 37 U/L    ALT 55 (*) 0 - 53 U/L    Alkaline Phosphatase 145 (*) 39 - 117 U/L    Total Bilirubin 0.6  0.3 - 1.2 mg/dL    GFR calc non Af Amer >90  >90 mL/min    GFR calc Af Amer >90  >90 mL/min     Imaging: Dg Chest Port 1 View  01/06/2012  *RADIOLOGY REPORT*  Clinical Data: Pre pacemaker.  Hypertension, diabetic.  PORTABLE CHEST - 1 VIEW  Comparison: 05/11/2011  Findings: Heart and mediastinal contours are within normal limits. No focal opacities or effusions.  No acute bony abnormality. Spinal stimulator device noted  in the mid thoracic spine.  IMPRESSION: No active cardiopulmonary disease.   Original Report Authenticated By: Cyndie Chime, [name]     Assessment:  1. Principal Problem: 2.  *Sinus arrest, 5 second pause on event monitor 3. Active Problems: 4.  GERD 5.  Syncope 6.  Diabetes mellitus, type 2 IDDM 7.  Obesity 8.  Smoker 9.  Sleep apnea, pt declines C-Ppap 10.  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   Plan:  1. No heart block overnight. On schedule for PPM Monday at 12:00. Furosemide for edema. Avoid higher doses of Actos.  Time Spent Directly with Patient:  15 minutes  Length of Stay:  LOS: 1 day    Ralph [name] 01/07/2012, 11:31 AM

## 2012-01-08 HISTORY — PX: PERMANENT PACEMAKER INSERTION: SHX6023

## 2012-01-08 LAB — GLUCOSE, CAPILLARY
Glucose-Capillary: 142 mg/dL — ABNORMAL HIGH (ref 70–99)
Glucose-Capillary: 138 mg/dL — ABNORMAL HIGH (ref 70–99)
Glucose-Capillary: 133 mg/dL — ABNORMAL HIGH (ref 70–99)

## 2012-01-08 MED ORDER — FUROSEMIDE 80 MG PO TABS
80.0000 mg | ORAL_TABLET | Freq: Every day | ORAL | Status: DC
  Administered 2012-01-09 – 2012-01-10 (×2): 80 mg via ORAL
  Filled 2012-01-08 (×2): qty 1

## 2012-01-08 MED ORDER — SODIUM CHLORIDE 0.45 % IV SOLN
INTRAVENOUS | Status: DC
  Administered 2012-01-09: 06:00:00 via INTRAVENOUS

## 2012-01-08 MED ORDER — DEXTROSE 5 % IV SOLN
3.0000 g | INTRAVENOUS | Status: DC
  Filled 2012-01-08: qty 3000

## 2012-01-08 MED ORDER — SODIUM CHLORIDE 0.9 % IR SOLN
80.0000 mg | Status: DC
  Filled 2012-01-08: qty 2

## 2012-01-08 MED ORDER — CHLORHEXIDINE GLUCONATE 4 % EX LIQD
60.0000 mL | Freq: Once | CUTANEOUS | Status: AC
  Administered 2012-01-08: 4 via TOPICAL
  Filled 2012-01-08: qty 60

## 2012-01-08 MED ORDER — SODIUM CHLORIDE 0.9 % IJ SOLN: 3.0000 mL | INTRAMUSCULAR | Status: DC | PRN

## 2012-01-08 MED ORDER — DIAZEPAM 5 MG PO TABS
5.0000 mg | ORAL_TABLET | ORAL | Status: AC
  Administered 2012-01-09: 5 mg via ORAL
  Filled 2012-01-08: qty 1

## 2012-01-08 MED ORDER — FUROSEMIDE 80 MG PO TABS: 80.0000 mg | ORAL_TABLET | Freq: Every day | ORAL | Status: DC

## 2012-01-08 MED ORDER — ALUM & MAG HYDROXIDE-SIMETH 200-200-20 MG/5ML PO SUSP
30.0000 mL | ORAL | Status: DC | PRN
  Administered 2012-01-08: 30 mL via ORAL
  Filled 2012-01-08: qty 30

## 2012-01-08 MED ORDER — CHLORHEXIDINE GLUCONATE 4 % EX LIQD
60.0000 mL | Freq: Once | CUTANEOUS | Status: AC
  Administered 2012-01-09: 4 via TOPICAL
  Filled 2012-01-08: qty 60

## 2012-01-08 NOTE — Progress Notes (Signed)
[facility]  DAILY PROGRESS NOTE   Subjective:  Nicotine patch is helping. Poor response to oral diuretic. Brief episode of 2nd dgree AV block MT2 overnight, HR 32 bpm. Short run of SVT at 2200h, 160 bpm, about 20 beats, felt as palpitations.  Objective:  Temp:  [97.8 F (36.6 C)-98.2 F (36.8 C)] 97.8 F (36.6 C) (09/29 0600) Pulse Rate:  [85-87] 85  (09/29 0600) Resp:  [17-20] 18  (09/29 0600) BP: (123-144)/(59-69) 144/59 mmHg (09/29 0906) SpO2:  [95 %-96 %] 96 % (09/29 0600) Weight change:   Intake/Output from previous day: 09/28 0701 - 09/29 0700 In: 720 [P.O.:720] Out: -   Intake/Output from this shift:    Medications: Current Facility-Administered Medications  Medication Dose Route Frequency Provider Last Rate Last Dose  . acetaminophen (TYLENOL) tablet 650 mg  650 mg Oral Q4H PRN Abelino Derrick, PA      . buPROPion [facility] SR) 12 hr tablet 150 mg  150 mg Oral BID Eda Paschal [place], [place]   150 mg at 01/08/12 0906  . furosemide (LASIX) tablet 40 mg  40 mg Oral Daily Audryana Hockenberry, MD   40 mg at 01/08/12 0907  . insulin aspart (novoLOG) injection 0-15 Units  0-15 Units Subcutaneous TID [facility] Eda Paschal [place], [place]   2 Units at 01/08/12 4540  . insulin aspart (novoLOG) injection 0-5 Units  0-5 Units Subcutaneous QHS Eda Paschal [place], [place]      . insulin glargine (LANTUS) injection 15 Units  15 Units Subcutaneous Daily Eda Paschal [place], [place]   15 Units at 01/08/12 0916  . lisinopril (PRINIVIL,ZESTRIL) tablet 10 mg  10 mg Oral Daily Eda Paschal [place], [place]   10 mg at 01/08/12 9811  . pioglitazone (ACTOS) tablet 15 mg  15 mg Oral BID WC Governor Rooks, MD   15 mg at 01/08/12 9147   And  . metFORMIN (GLUCOPHAGE) tablet 850 mg  850 mg Oral BID WC Governor Rooks, MD   850 mg at 01/08/12 8295  . nicotine (NICODERM CQ - dosed in mg/24 hours) patch 14 mg  14 mg Transdermal Daily Evalyne Cortopassi, MD   14 mg at 01/08/12 0907  . pantoprazole (PROTONIX) EC tablet 40 mg   40 mg Oral Q1200 Abelino Derrick, PA   40 mg at 01/07/12 1300  . zolpidem (AMBIEN) tablet 5 mg  5 mg Oral QHS PRN Abelino Derrick, PA      . DISCONTD: pioglitazone (ACTOS) tablet 15 mg  15 mg Oral BID WC Governor Rooks, MD   15 mg at 01/07/12 0800    Physical Exam: General appearance: alert, cooperative, no distress and moderately obese  Neck: no adenopathy, no carotid bruit, no JVD, supple, symmetrical, trachea midline and thyroid not enlarged, symmetric, no tenderness/mass/nodules  Lungs: clear to auscultation bilaterally  Heart: regular rate and rhythm  Abdomen: soft, non-tender; bowel sounds normal; no masses, no organomegaly  Extremities: extremities normal, atraumatic, no cyanosis and 2+ edema bilateral shins  Pulses: 2+ and symmetric  Skin: Skin color, texture, turgor normal. No rashes or lesions  Neurologic: Grossly normal   Lab Results: Results for orders placed during the hospital encounter of 01/06/12 (from the past 48 hour(s))  CBC     Status: Normal   Collection Time   01/06/12  3:51 PM      Component Value Range Comment   WBC 9.5  4.0 - 10.5 K/uL    RBC 4.84  4.22 - 5.81 MIL/uL    Hemoglobin 16.2  13.0 - 17.0 g/dL    HCT 16.1  09.6 - 04.5 %    MCV 97.7  78.0 - 100.0 fL    MCH 33.5  26.0 - 34.0 pg    MCHC 34.2  30.0 - 36.0 g/dL    RDW 40.9  81.1 - 91.4 %    Platelets 294  150 - 400 K/uL   TSH     Status: Normal   Collection Time   01/06/12  3:51 PM      Component Value Range Comment   TSH 2.093  0.350 - 4.500 uIU/mL   HEMOGLOBIN A1C     Status: Abnormal   Collection Time   01/06/12  3:51 PM      Component Value Range Comment   Hemoglobin A1C 6.7 (*) <5.7 %    Mean Plasma Glucose 146 (*) <117 mg/dL   TROPONIN I     Status: Normal   Collection Time   01/06/12  3:52 PM      Component Value Range Comment   Troponin I <0.30  <0.30 ng/mL   GLUCOSE, CAPILLARY     Status: Abnormal   Collection Time   01/06/12  4:28 PM      Component Value Range Comment    Glucose-Capillary 125 (*) 70 - 99 mg/dL   URINALYSIS, ROUTINE W REFLEX MICROSCOPIC     Status: Abnormal   Collection Time   01/06/12  7:58 PM      Component Value Range Comment   Color, Urine YELLOW  YELLOW    APPearance CLOUDY (*) CLEAR    Specific Gravity, Urine 1.014  1.005 - 1.030    pH 5.5  5.0 - 8.0    Glucose, UA NEGATIVE  NEGATIVE mg/dL    Hgb urine dipstick SMALL (*) NEGATIVE    Bilirubin Urine NEGATIVE  NEGATIVE    Ketones, ur NEGATIVE  NEGATIVE mg/dL    Protein, ur NEGATIVE  NEGATIVE mg/dL    Urobilinogen, UA 0.2  0.0 - 1.0 mg/dL    Nitrite NEGATIVE  NEGATIVE    Leukocytes, UA NEGATIVE  NEGATIVE   URINE MICROSCOPIC-ADD ON     Status: Abnormal   Collection Time   01/06/12  7:58 PM      Component Value Range Comment   Squamous Epithelial / LPF FEW (*) RARE    WBC, UA 0-2  <3 WBC/hpf    RBC / HPF 0-2  <3 RBC/hpf    Bacteria, UA RARE  RARE   GLUCOSE, CAPILLARY     Status: Abnormal   Collection Time   01/06/12  8:23 PM      Component Value Range Comment   Glucose-Capillary 140 (*) 70 - 99 mg/dL    Comment 1 Notify RN     GLUCOSE, CAPILLARY     Status: Abnormal   Collection Time   01/07/12  7:26 AM      Component Value Range Comment   Glucose-Capillary 139 (*) 70 - 99 mg/dL   COMPREHENSIVE METABOLIC PANEL     Status: Abnormal   Collection Time   01/07/12  9:45 AM      Component Value Range Comment   Sodium 137  135 - 145 mEq/L    Potassium 4.5  3.5 - 5.1 mEq/L    Chloride 99  96 - 112 mEq/L    CO2 27  19 - 32 mEq/L    Glucose, Bld 154 (*) 70 - 99  mg/dL    BUN 17  6 - 23 mg/dL    Creatinine, Ser 1.61  0.50 - 1.35 mg/dL    Calcium 9.6  8.4 - 09.6 mg/dL    Total Protein 7.3  6.0 - 8.3 g/dL    Albumin 3.7  3.5 - 5.2 g/dL    AST 30  0 - 37 U/L    ALT 55 (*) 0 - 53 U/L    Alkaline Phosphatase 145 (*) 39 - 117 U/L    Total Bilirubin 0.6  0.3 - 1.2 mg/dL    GFR calc non Af Amer >90  >90 mL/min    GFR calc Af Amer >90  >90 mL/min   GLUCOSE, CAPILLARY     Status:  Abnormal   Collection Time   01/07/12 11:54 AM      Component Value Range Comment   Glucose-Capillary 203 (*) 70 - 99 mg/dL   GLUCOSE, CAPILLARY     Status: Abnormal   Collection Time   01/07/12  3:52 PM      Component Value Range Comment   Glucose-Capillary 119 (*) 70 - 99 mg/dL   GLUCOSE, CAPILLARY     Status: Abnormal   Collection Time   01/07/12  9:42 PM      Component Value Range Comment   Glucose-Capillary 132 (*) 70 - 99 mg/dL   GLUCOSE, CAPILLARY     Status: Abnormal   Collection Time   01/08/12  8:00 AM      Component Value Range Comment   Glucose-Capillary 142 (*) 70 - 99 mg/dL     Imaging: Imaging results have been reviewed  Assessment:  1. Principal Problem: 2.  *Sinus arrest, 5 second pause on event monitor 3. Active Problems: 4.  GERD 5.  Syncope 6.  Diabetes mellitus, type 2 IDDM 7.  Obesity 8.  Smoker 9.  Sleep apnea, pt declines C-Ppap 10.  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   Plan:  1. Dual chamber PPM tomorrow. Increase diuretic dose.  Time Spent Directly with Patient:  20 minutes  Length of Stay:  LOS: 2 days    Boleslaw Borghi 01/08/2012, 9:38 AM

## 2012-01-08 NOTE — Progress Notes (Signed)
Dr. Corine Shelter notified of pt complaint of heartburn.  Orders given for Maalox 30 mL po q4hr prn.

## 2012-01-09 ENCOUNTER — Ambulatory Visit (HOSPITAL_COMMUNITY): Admission: RE | Admit: 2012-01-09 | Payer: Medicare Other | Source: Ambulatory Visit | Admitting: Cardiovascular Disease

## 2012-01-09 ENCOUNTER — Encounter (HOSPITAL_COMMUNITY)
Admission: AD | Disposition: A | Discharge status: Home / Self Care | Payer: Self-pay | Source: Ambulatory Visit | Attending: Cardiovascular Disease

## 2012-01-09 DIAGNOSIS — I5031 Acute diastolic (congestive) heart failure: Secondary | ICD-10-CM | POA: Diagnosis present

## 2012-01-09 HISTORY — PX: PERMANENT PACEMAKER INSERTION: SHX5480

## 2012-01-09 LAB — GLUCOSE, CAPILLARY
Glucose-Capillary: 127 mg/dL — ABNORMAL HIGH (ref 70–99)
Glucose-Capillary: 150 mg/dL — ABNORMAL HIGH (ref 70–99)

## 2012-01-09 LAB — PROTIME-INR
INR: 0.93 (ref 0.00–1.49)
Prothrombin Time: 12.4 seconds (ref 11.6–15.2)

## 2012-01-09 SURGERY — PERMANENT PACEMAKER INSERTION
Anesthesia: LOCAL

## 2012-01-09 MED ORDER — LIDOCAINE HCL (PF) 1 % IJ SOLN
INTRAMUSCULAR | Status: AC
  Filled 2012-01-09: qty 60

## 2012-01-09 MED ORDER — SODIUM CHLORIDE 0.9 % IJ SOLN
3.0000 mL | Freq: Two times a day (BID) | INTRAMUSCULAR | Status: DC
  Administered 2012-01-09: 3 mL via INTRAVENOUS

## 2012-01-09 MED ORDER — ONDANSETRON HCL 4 MG/2ML IJ SOLN: 4.0000 mg | Freq: Four times a day (QID) | INTRAMUSCULAR | Status: DC | PRN

## 2012-01-09 MED ORDER — ACETAMINOPHEN 325 MG PO TABS: 325.0000 mg | ORAL_TABLET | ORAL | Status: DC | PRN

## 2012-01-09 MED ORDER — FENTANYL CITRATE 0.05 MG/ML IJ SOLN
INTRAMUSCULAR | Status: AC
  Filled 2012-01-09: qty 2

## 2012-01-09 MED ORDER — CEFAZOLIN SODIUM 1-5 GM-% IV SOLN
1.0000 g | Freq: Four times a day (QID) | INTRAVENOUS | Status: AC
  Administered 2012-01-09 – 2012-01-10 (×3): 1 g via INTRAVENOUS
  Filled 2012-01-09 (×4): qty 50

## 2012-01-09 MED ORDER — SODIUM CHLORIDE 0.9 % IJ SOLN: 3.0000 mL | INTRAMUSCULAR | Status: DC | PRN

## 2012-01-09 MED ORDER — MIDAZOLAM HCL 2 MG/2ML IJ SOLN
INTRAMUSCULAR | Status: AC
  Filled 2012-01-09: qty 2

## 2012-01-09 MED ORDER — YOU HAVE A PACEMAKER BOOK
Freq: Once | Status: AC
  Administered 2012-01-09: 06:00:00
  Filled 2012-01-09: qty 1

## 2012-01-09 MED ORDER — SODIUM CHLORIDE 0.9 % IV SOLN: 250.0000 mL | INTRAVENOUS | Status: DC | PRN

## 2012-01-09 NOTE — CV Procedure (Signed)
Ralph [name], Heitz Male, 61 y.o., June 19, 1950  Location: [facility]  Bed: NONE  MRN: [id number]  CSN: [id number]  Procedure report  Procedure performed:  1. Implantation of new dual chamber permanent pacemaker 2. Fluoroscopy 3. Light sedation  Reason for procedure: Syncope due to: Second degree atrioventricular block Mobitz type II  Procedure performed by: Thurmon Fair, MD  Complications: None  Estimated blood loss: <10 mL  Medications administered during procedure: Ancef 3 g intravenously Lidocaine 1% 30 mL locally,  Fentanyl 50 mcg intravenously Versed 2 mg intravenously  Device details: Facilities manager. [employer] model [id number] serial number [id number] Right atrial lead [employer] 2088-52 serial number [id number] Right ventricular lead [employer] 2088-58 serial number [id number]  Procedure details:  After the risks and benefits of the procedure were discussed the patient provided informed consent and was brought to the cardiac cath lab in the fasting state. The patient was prepped and draped in usual sterile fashion. Local anesthesia with 1% lidocaine was administered to to the left infraclavicular area. A 5-6 cm horizontal incision was made parallel with and 2-3 cm caudal to the left clavicle. Using electrocautery and blunt dissection a prepectoral pocket was created down to the level of the pectoralis major muscle fascia. The pocket was carefully inspected for hemostasis. An antibiotic-soaked sponge was placed in the pocket.  Under fluoroscopic guidance and using the modified Seldinger technique 2 separate venipunctures were performed to access the left subclavian vein. Little difficulty was encountered accessing the vein.  Two J-tip guidewires were subsequently exchanged for two 7 French safe sheaths.  Under fluoroscopic guidance the ventricular lead was advanced to level of the mid to apical right ventricular septum and thet active-fixation helix was deployed.  Prominent current of injury was seen. Satisfactory pacing and sensing parameters were recorded. There was no evidence of diaphragmatic stimulation at maximum device output. The safe sheath was peeled away and the lead was secured in place with 2-0 silk.  In similar fashion the right atrial lead was advanced to the level of the atrial appendage. The active-fixation helix was deployed. There was prominent current of injury. Satisfactory  pacing and sensing parameters were recorded. There was no evidence of diaphragmatic stimulation with pacing at maximum device output. The safe sheath was peeled away and the lead was secured in place with 2-0 silk.  The antibiotic-soaked sponge was removed from the pocket. The pocket was flushed with copious amounts of antibiotic solution. Reinspection showed excellent hemostasis..  The ventricular lead was connected to the generator and appropriate ventricular pacing was seen. Subsequently the atrial lead was also connected. Repeat testing of the lead parameters later showed excellent values.  The entire system was then carefully inserted in the pocket with care been taking that the leads and device assumed a comfortable position without pressure on the incision. Great care was taken that the leads be located deep to the generator. The pocket was then closed in layers using 2 layers of 2-0 Vicryl and cutaneous staples, after which a sterile dressing was applied.  At the end of the procedure the following lead parameters were encountered:  Right atrial lead  sensed P waves 2.9-3.5 mV, impedance 601 ohms, threshold 1.8 V at 0.4 ms pulse width. Right ventricular lead  sensed R waves 9.0 mV, impedance 973 ohms, threshold 0.8 V at 0.4 ms pulse width.   Cc: Thurmon Fair, MD, [facility] [phone] office [phone] pager 01/09/2012 2:37 PM

## 2012-01-09 NOTE — Clinical Documentation Improvement (Signed)
CHF DOCUMENTATION CLARIFICATION QUERY  THIS DOCUMENT IS NOT A PERMANENT PART OF THE MEDICAL RECORD  TO RESPOND TO THE THIS QUERY, FOLLOW THE INSTRUCTIONS BELOW:  1. If needed, update documentation for the patient's encounter via the notes activity.  2. Access this query again and click edit on the In [employer].  3. After updating, or not, click F2 to complete all highlighted (required) fields concerning your review. Select "additional documentation in the medical record" OR "no additional documentation provided".  4. Click Sign note button.  5. The deficiency will fall out of your In Basket *Please let us know if you are not able to complete this workflow by phone or e-mail (listed below).  Please update your documentation within the medical record to reflect your response to this query.                                                                                    01/09/12  Dear Dr. Royann Shivers / Associates,  In a better effort to capture your patient's severity of illness/SOI, risk of mortality/ROM, reflect appropriate length of stay and utilization of resources, a review of the patient medical record has revealed the following indicators the diagnosis of Heart Failure.  PLEASE CLARIFY IN NOTES/DC SUMMARY TYPE AND ACUITY OF CHF.  Possible Clinical Conditions?  ---------Acute on chronic Diastolic Congestive Heart Failure---------  Supporting Information: - Risk Factors: Cirrhosis, esophogeal varicose vein, Sinus arrest, 5 second pause on event monitor - Signs & Symptoms: peripheral edema 2+ bilateral shins, per H&P:"By exam has evidence of right heart failure." Per 9/29 Note:"Poor response to oral diuretic." - Diuretics: Lasix 40mg  daily   Reviewed: additional documentation in the medical record. Added to problem list. Will be included in next progress note and DC summary.  Thank You,  Beverley Fiedler RN Clinical Documentation Specialist: Pager: [phone] Health Information  Management: [phone] [facility]

## 2012-01-09 NOTE — Progress Notes (Signed)
[facility]  Subjective: Orthopnea.  Sleeping at ~10 degrees.  +LEE  Objective: Vital signs in last 24 hours: Temp:  [97.7 F (36.5 C)-98.5 F (36.9 C)] 98.5 F (36.9 C) (09/30 0600) Pulse Rate:  [85-98] 85  (09/30 0600) Resp:  [17-19] 19  (09/30 0600) BP: (119-134)/(63-71) 134/64 mmHg (09/30 0600) SpO2:  [93 %-94 %] 93 % (09/30 0600) Last BM Date: 01/08/12  Intake/Output from previous day:   Intake/Output this shift:    Medications Current Facility-Administered Medications  Medication Dose Route Frequency Provider Last Rate Last Dose  . 0.45 % sodium chloride infusion   Intravenous Continuous Abelino Derrick, [place] 50 mL/hr at 01/09/12 0550    . acetaminophen (TYLENOL) tablet 650 mg  650 mg Oral Q4H PRN Abelino Derrick, PA      . alum & mag hydroxide-simeth (MAALOX/MYLANTA) 200-200-20 MG/5ML suspension 30 mL  30 mL Oral Q4H PRN Abelino Derrick, PA   30 mL at 01/08/12 1702  . buPROPion (WELLBUTRIN SR) 12 hr tablet 150 mg  150 mg Oral BID Eda Paschal [place], [place]   150 mg at 01/08/12 2118  . ceFAZolin (ANCEF) 3 g in dextrose 5 % 50 mL IVPB  3 g Intravenous On Call Abelino Derrick, PA      . chlorhexidine (HIBICLENS) 4 % liquid 4 application  60 mL Topical Once Abelino Derrick, [place]   4 application at 01/08/12 2100  . chlorhexidine (HIBICLENS) 4 % liquid 4 application  60 mL Topical Once Abelino Derrick, [place]   4 application at 01/09/12 [phone]  . diazepam (VALIUM) tablet 5 mg  5 mg Oral On Call Abelino Derrick, [place]      . furosemide (LASIX) tablet 80 mg  80 mg Oral Daily Governor Rooks, MD   80 mg at 01/09/12 0548  . gentamicin (GARAMYCIN) 80 mg in sodium chloride irrigation 0.9 % 500 mL irrigation  80 mg Irrigation On Call Governor Rooks, MD      . insulin aspart (novoLOG) injection 0-15 Units  0-15 Units Subcutaneous TID WC Abelino Derrick, PA   2 Units at 01/08/12 1740  . insulin aspart (novoLOG) injection 0-5 Units  0-5 Units Subcutaneous QHS Eda Paschal [place], [place]      .  insulin glargine (LANTUS) injection 15 Units  15 Units Subcutaneous Daily Eda Paschal [place], [place]   15 Units at 01/08/12 0916  . lisinopril (PRINIVIL,ZESTRIL) tablet 10 mg  10 mg Oral Daily Eda Paschal [place], [place]   10 mg at 01/08/12 6213  . pioglitazone (ACTOS) tablet 15 mg  15 mg Oral BID WC Governor Rooks, MD   15 mg at 01/08/12 1739   And  . metFORMIN (GLUCOPHAGE) tablet 850 mg  850 mg Oral BID WC Governor Rooks, MD   850 mg at 01/08/12 1738  . nicotine (NICODERM CQ - dosed in mg/24 hours) patch 14 mg  14 mg Transdermal Daily China Deitrick, MD   14 mg at 01/08/12 0907  . pantoprazole (PROTONIX) EC tablet 40 mg  40 mg Oral Q1200 Abelino Derrick, PA   40 mg at 01/08/12 1239  . sodium chloride 0.9 % injection 3 mL  3 mL Intravenous PRN Abelino Derrick, PA      . you have a pacemaker book   Does not apply Once Governor Rooks, MD      . zolpidem (AMBIEN) tablet 5 mg  5 mg Oral QHS  PRN Abelino Derrick, PA      . DISCONTD: furosemide (LASIX) tablet 80 mg  80 mg Oral Daily Keith Cancio, MD        PE: General appearance: alert, cooperative and no distress Lungs: clear to auscultation bilaterally Heart: regular rate and rhythm, S1, S2 normal, no murmur, click, rub or gallop Extremities: 1+ LEE Pulses: 2+ and symmetric Skin: Warm and dry. 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   Short run ~2-3 sec SVT.  2nd deg AVB. BP and HR stable.     LOS: 3 days     HAGER, BRYAN 01/09/2012 10:29 AM  I have seen and examined the patient along with Wilburt Finlay, PA.  I have reviewed the chart, notes and new data.  I agree with PA's note.  PLAN: Dual chamber PPM today. This procedure has been fully reviewed with the patient and written informed consent has been obtained.   Thurmon Fair, MD, [facility] Center [phone] 01/09/2012, 11:56 AM

## 2012-01-10 ENCOUNTER — Inpatient Hospital Stay (HOSPITAL_COMMUNITY): Payer: Medicare Other

## 2012-01-10 LAB — BASIC METABOLIC PANEL
BUN: 23 mg/dL (ref 6–23)
CO2: 24 mEq/L (ref 19–32)
Calcium: 10 mg/dL (ref 8.4–10.5)
Chloride: 95 mEq/L — ABNORMAL LOW (ref 96–112)
Creatinine, Ser: 0.9 mg/dL (ref 0.50–1.35)
GFR calc Af Amer: >90 mL/min (ref >90)
GFR calc non Af Amer: >90 mL/min (ref >90)
Glucose, Bld: 116 mg/dL — ABNORMAL HIGH (ref 70–99)
Potassium: 4.2 mEq/L (ref 3.5–5.1)
Sodium: 135 mEq/L (ref 135–145)

## 2012-01-10 LAB — GLUCOSE, CAPILLARY: Glucose-Capillary: 157 mg/dL — ABNORMAL HIGH (ref 70–99)

## 2012-01-10 MED ORDER — FUROSEMIDE 40 MG PO TABS: 40.0000 mg | ORAL_TABLET | Freq: Every day | ORAL | Status: DC

## 2012-01-10 MED ORDER — NICOTINE 14 MG/24HR TD PT24: 30.0000 | MEDICATED_PATCH | Freq: Every day | TRANSDERMAL | Status: AC

## 2012-01-10 NOTE — Progress Notes (Signed)
Subjective:  SOB improved  Objective:  Vital Signs in the last 24 hours: Temp:  [98.1 F (36.7 C)-99.6 F (37.6 C)] 99.6 F (37.6 C) (10/01 0500) Pulse Rate:  [77-110] 77  (10/01 0500) Resp:  [20-22] 20  (10/01 0500) BP: (92-134)/(45-78) 129/76 mmHg (10/01 0500) SpO2:  [91 %-96 %] 96 % (10/01 0500)  Intake/Output from previous day: No intake or output data in the 24 hours ending 01/10/12 1014  Physical Exam: General appearance: alert, cooperative, no distress and moderately obese Lungs: clear to auscultation bilaterally Heart: regular rate and rhythm Pacer site without hematoma   Rate: 80  Rhythm: normal sinus rhythm  Lab Results: No results found for this basename: WBC:2,HGB:2,PLT:2 in the last 72 hours No results found for this basename: NA:2,K:2,CL:2,CO2:2,GLUCOSE:2,BUN:2,CREATININE:2 in the last 72 hours No results found for this basename: TROPONINI:2,CK,MB:2 in the last 72 hours Hepatic Function Panel No results found for this basename: PROT,ALBUMIN,AST,ALT,ALKPHOS,BILITOT,BILIDIR,IBILI in the last 72 hours No results found for this basename: CHOL in the last 72 hours  Basename 01/09/12 0525  INR 0.93    Imaging: Imaging results have been reviewed  Cardiac Studies:  Assessment/Plan:   Active Problems:  Syncope  Sinus arrest, 5 second pause on event monitor  Diabetes mellitus, type 2 IDDM   Acute on chronic diastolic heart failure, predominantly Right heart failure  GERD  Obesity  Smoker  Sleep apnea, pt declines C-Ppap  DJD, 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 Check BMP before discharge.     Corine Shelter PA-C 01/10/2012, 10:14 AM    I have seen and examined the patient along with Corine Shelter, PA-C.  I have reviewed the chart, notes and new data.  I agree with PA's note.  Key new complaints: mild soreness at device site, edema and dyspnea are better Key examination changes: healthy surgical site Key new  findings / data:  RA P waves  >72mV, imped 530 ohm, threshold 0.5V@0 .4ms RV P waves 10.40mV, imped 860 ohm, threshold 0.5 V@0 .4ms  PLAN: DC home. Continue smoking cessation efforts. F/U wound check next Wednesday in [place].  Thurmon Fair, MD, [facility] [phone] 01/10/2012, 11:04 AM

## 2012-01-10 NOTE — Discharge Summary (Signed)
>  10 minutes spent discussing smoking cessation strategies. Thurmon Fair, MD, [facility] and [facility] Center [phone] office [phone] pager 01/10/2012 2:14 PM

## 2012-01-10 NOTE — Discharge Summary (Signed)
Patient ID: Ralph [name],  MRN: [id number], DOB/AGE: [birth year] 61 y.o.  Admit date: 01/06/2012 Discharge date: 01/10/2012  Primary Care Provider: Dr Regino Schultze Primary Cardiologist: Dr Alanda Amass  Discharge Diagnoses Active Problems:  Syncope  Sinus arrest, 5 second pause on event monitor  Diabetes mellitus, type 2 IDDM   Acute on chronic diastolic heart failure, predominantly Right heart failure  GERD  Obesity  Smoker  Sleep apnea, pt declines C-Ppap  DJD, on disability secondary to back pain, surg X 3    Procedures: Permanent pacemaker implant 01/08/12   Hospital Course  61 y/o pt from Ralph [name], seen by our group in the past. He has had chest pain, low risk nuclear study in 2006. He has obesity, OSA (not on C-Pap), IDDM, chronic back pain. He saw Dr Alanda Amass in the office 12/13/11 after an episode of syncope and an episode of near syncope. Some of these episodes sounded like "cough syncope". An echo was done 12/28/11 and was essentially normal. A Holter monitor was placed and the pt was reported to have a 5.2 second pause last night. He is admitted from the office today for further evaluation. See Dr Kandis Cocking office note from 01/06/12 for complete details. The pat was admitted to telemetry. He complained of orthopnea, lower extremity edema and dyspnea. He was treated for acute diastolic CHF. He had documented AVB on telemetry and underwent pacemaker implant 01/08/12. We feel he can be discharged 01/09/12. He is now on Lasix 40mg , new for him. He has a follow up with dr Royann Shivers 01/18/12.   Discharge Vitals:  Blood pressure 129/76, pulse 77, temperature 99.6 F (37.6 C), temperature source Oral, resp. rate 20, SpO2 96.00%.    Labs: Results for orders placed during the hospital encounter of 01/06/12 (from the past 48 hour(s))  GLUCOSE, CAPILLARY     Status: Abnormal   Collection Time   01/08/12  4:48 PM      Component Value Range Comment   Glucose-Capillary 133 (*) 70 - 99 mg/dL     GLUCOSE, CAPILLARY     Status: Abnormal   Collection Time   01/08/12  9:00 PM      Component Value Range Comment   Glucose-Capillary 138 (*) 70 - 99 mg/dL   PROTIME-INR     Status: Normal   Collection Time   01/09/12  5:25 AM      Component Value Range Comment   Prothrombin Time 12.4  11.6 - 15.2 seconds    INR 0.93  0.00 - 1.49   SURGICAL PCR SCREEN     Status: Normal   Collection Time   01/09/12  5:46 AM      Component Value Range Comment   MRSA, PCR NEGATIVE  NEGATIVE    Staphylococcus aureus NEGATIVE  NEGATIVE   GLUCOSE, CAPILLARY     Status: Abnormal   Collection Time   01/09/12  7:30 AM      Component Value Range Comment   Glucose-Capillary 145 (*) 70 - 99 mg/dL    Comment 1 Notify RN     GLUCOSE, CAPILLARY     Status: Abnormal   Collection Time   01/09/12 11:26 AM      Component Value Range Comment   Glucose-Capillary 127 (*) 70 - 99 mg/dL    Comment 1 Notify RN     GLUCOSE, CAPILLARY     Status: Abnormal   Collection Time   01/09/12  4:48 PM      Component Value Range Comment  Glucose-Capillary 170 (*) 70 - 99 mg/dL    Comment 1 Notify RN     GLUCOSE, CAPILLARY     Status: Abnormal   Collection Time   01/09/12  8:30 PM      Component Value Range Comment   Glucose-Capillary 150 (*) 70 - 99 mg/dL    Comment 1 Notify RN     GLUCOSE, CAPILLARY     Status: Abnormal   Collection Time   01/10/12  7:41 AM      Component Value Range Comment   Glucose-Capillary 157 (*) 70 - 99 mg/dL   BASIC METABOLIC PANEL     Status: Abnormal   Collection Time   01/10/12 10:30 AM      Component Value Range Comment   Sodium 135  135 - 145 mEq/L    Potassium 4.2  3.5 - 5.1 mEq/L    Chloride 95 (*) 96 - 112 mEq/L    CO2 24  19 - 32 mEq/L    Glucose, Bld 116 (*) 70 - 99 mg/dL    BUN 23  6 - 23 mg/dL    Creatinine, Ser 4.09  0.50 - 1.35 mg/dL    Calcium 81.1  8.4 - 10.5 mg/dL    GFR calc non Af Amer >90  >90 mL/min    GFR calc Af Amer >90  >90 mL/min     Disposition:  Follow-up  Information    Follow up with Governor Rooks, MD. (office will call)    Contact information:   [address]  [place] [address] [phone]          Discharge Medications:    Medication List     As of 01/10/2012  1:30 PM    TAKE these medications         aspirin EC 81 MG tablet   Take 81 mg by mouth daily.      buPROPion 150 MG 12 hr tablet   Commonly known as: WELLBUTRIN SR   Take 150 mg by mouth 2 (two) times daily.      diazepam 10 MG tablet   Commonly known as: VALIUM   Take 10 mg by mouth every 6 (six) hours as needed. For anxiety      furosemide 40 MG tablet   Commonly known as: LASIX   Take 1 tablet (40 mg total) by mouth daily.      HYDROcodone-acetaminophen 10-650 MG per tablet   Commonly known as: LORCET   Take 1 tablet by mouth every 6 (six) hours as needed. For pain      insulin glargine 100 UNIT/ML injection   Commonly known as: LANTUS   Inject 100 Units into the skin daily.      lisinopril 10 MG tablet   Commonly known as: PRINIVIL,ZESTRIL   Take 10 mg by mouth every morning.      nicotine 14 mg/24hr patch   Commonly known as: NICODERM CQ - dosed in mg/24 hours   Place 30 patches onto the skin daily.      pantoprazole 40 MG tablet   Commonly known as: PROTONIX   Take 40 mg by mouth every morning.      pioglitazone-metformin 15-850 MG per tablet   Commonly known as: ACTOPLUS MET   Take 1 tablet by mouth 2 (two) times daily.         Duration of Discharge Encounter: Greater than 30 minutes including physician time.  Jolene Provost PA-C 01/10/2012  1:30 PM

## 2012-02-27 ENCOUNTER — Other Ambulatory Visit: Payer: Self-pay

## 2012-02-27 DIAGNOSIS — R0602 Shortness of breath: Secondary | ICD-10-CM

## 2012-03-02 ENCOUNTER — Ambulatory Visit (HOSPITAL_COMMUNITY)
Admission: RE | Admit: 2012-03-02 | Discharge: 2012-03-02 | Disposition: A | Discharge status: Home / Self Care | Payer: Medicare Other | Source: Ambulatory Visit | Attending: Pulmonary Disease | Admitting: Pulmonary Disease

## 2012-03-02 DIAGNOSIS — R0602 Shortness of breath: Secondary | ICD-10-CM | POA: Insufficient documentation

## 2012-03-02 LAB — BLOOD GAS, ARTERIAL
Bicarbonate: 25.8 mEq/L — ABNORMAL HIGH (ref 20.0–24.0)
O2 Saturation: 90.6 %
TCO2: 22 mmol/L (ref 0–100)
pO2, Arterial: 64.6 mmHg — ABNORMAL LOW (ref 80.0–100.0)

## 2012-03-02 MED ORDER — ALBUTEROL SULFATE (5 MG/ML) 0.5% IN NEBU
2.5000 mg | INHALATION_SOLUTION | Freq: Once | RESPIRATORY_TRACT | Status: AC
  Administered 2012-03-02: 2.5 mg via RESPIRATORY_TRACT

## 2012-03-12 NOTE — Procedures (Signed)
NAMEMarland [name]  LILLARD, BAILON NO.:  [id number]  MEDICAL RECORD NO.:  [id number]  LOCATION:                                 FACILITY:  PHYSICIAN:  Maninder Deboer L. Juanetta Gosling, M.D.DATE OF BIRTH:  1950/06/15  DATE OF PROCEDURE:  03/07/2012 DATE OF DISCHARGE:                           PULMONARY FUNCTION TEST   REASON FOR PULMONARY FUNCTION TESTING:  Shortness of breath. 1. Spirometry shows a mild ventilatory defect with airflow obstruction     at the level of the smaller airways. 2. Lung volumes are normal. 3. DLCO is moderately reduced and does correct some when volume is     accounted for. 4. Airway resistance is normal. 5. There is no significant bronchodilator improvement. 6. Arterial blood gas shows relative resting hypoxia. 7. This study is consistent with COPD which does not appear to be very     severe.     [name], [name]     ELH/MEDQ  D:  03/07/2012  T:  03/08/2012  Job:  [id number]

## 2012-04-26 LAB — PULMONARY FUNCTION TEST

## 2012-05-03 ENCOUNTER — Ambulatory Visit (HOSPITAL_COMMUNITY)
Admission: RE | Admit: 2012-05-03 | Discharge: 2012-05-03 | Disposition: A | Discharge status: Home / Self Care | Payer: Medicare Other | Source: Ambulatory Visit | Attending: Cardiovascular Disease | Admitting: Cardiovascular Disease

## 2012-05-03 ENCOUNTER — Other Ambulatory Visit (HOSPITAL_COMMUNITY): Payer: Self-pay | Admitting: Cardiovascular Disease

## 2012-05-03 DIAGNOSIS — R0989 Other specified symptoms and signs involving the circulatory and respiratory systems: Secondary | ICD-10-CM | POA: Insufficient documentation

## 2012-05-03 DIAGNOSIS — R079 Chest pain, unspecified: Secondary | ICD-10-CM | POA: Insufficient documentation

## 2012-05-03 DIAGNOSIS — R0609 Other forms of dyspnea: Secondary | ICD-10-CM | POA: Insufficient documentation

## 2012-05-03 DIAGNOSIS — R55 Syncope and collapse: Secondary | ICD-10-CM | POA: Insufficient documentation

## 2012-05-03 DIAGNOSIS — E119 Type 2 diabetes mellitus without complications: Secondary | ICD-10-CM | POA: Insufficient documentation

## 2012-05-03 DIAGNOSIS — F17200 Nicotine dependence, unspecified, uncomplicated: Secondary | ICD-10-CM | POA: Insufficient documentation

## 2012-05-03 MED ORDER — AMINOPHYLLINE 25 MG/ML IV SOLN
125.0000 mg | Freq: Once | INTRAVENOUS | Status: AC
  Administered 2012-05-03: 125 mg via INTRAVENOUS

## 2012-05-03 MED ORDER — REGADENOSON 0.4 MG/5ML IV SOLN
0.4000 mg | Freq: Once | INTRAVENOUS | Status: AC
  Administered 2012-05-03: 0.4 mg via INTRAVENOUS

## 2012-05-03 MED ORDER — TECHNETIUM TC 99M SESTAMIBI GENERIC - CARDIOLITE
10.0000 | Freq: Once | INTRAVENOUS | Status: AC | PRN
  Administered 2012-05-03: 10 via INTRAVENOUS

## 2012-05-03 MED ORDER — TECHNETIUM TC 99M SESTAMIBI GENERIC - CARDIOLITE
30.0000 | Freq: Once | INTRAVENOUS | Status: AC | PRN
  Administered 2012-05-03: 30 via INTRAVENOUS

## 2012-05-03 NOTE — Procedures (Addendum)
[facility] [address] [place] [address] [place] [address] [phone]  Cardiology Nuclear Med Study  Ralph [name] is a 62 y.o. male     MRN : [id number]     DOB: May 19, 1950  Procedure Date: 05/03/2012  Nuclear Med Background Indication for Stress Test:  Evaluation for Ischemia History:  Pacer Cardiac Risk Factors: IDDM Type 2, Obesity and Smoker  Symptoms:  Chest Pain, DOE, Near Syncope and SOB   Nuclear Pre-Procedure Caffeine/Decaff Intake:  1:30am NPO After: 11:30am   IV Site: R Antecubital  IV 0.9% NS with Angio Cath:  22g  Chest Size (in):  50 IV Started by: Koren Shiver, CNMT  Height: 5\' 10"  (1.778 m)  Cup Size: n/a  BMI:  Body mass index is 38.74 kg/(m^2). Weight:  270 lb (122.471 kg)   Tech Comments:  n/a    Nuclear Med Study 1 or 2 day study: 1 day  Stress Test Type:  Lexiscan  Order Authorizing Provider:  Susa Griffins, MD   Resting Radionuclide: Technetium 32m Sestamibi  Resting Radionuclide Dose: 8.4 mCi   Stress Radionuclide:  Technetium 53m Sestamibi  Stress Radionuclide Dose: 25.6 mCi           Stress Protocol Rest HR: 93 Stress HR: 112  Rest BP: 121/66 Stress BP: 144/73  Exercise Time (min): n/a METS: n/a   Predicted Max HR: 161 bpm % Max HR: 69.57 bpm Rate Pressure Product: 21308   Dose of Adenosine (mg):  n/a Dose of Lexiscan: 0.4 mg  Dose of Atropine (mg): n/a Dose of Dobutamine: n/a mcg/kg/min (at max HR)  Stress Test Technologist: Esperanza Sheets, CCT Nuclear Technologist: Koren Shiver, CNMT   Rest Procedure:  Myocardial perfusion imaging was performed at rest 45 minutes following the intravenous administration of Technetium 14m Sestamibi. Stress Procedure:  The patient received IV Lexiscan 0.4 mg over 15-seconds.  Technetium 66m Sestamibi injected at 30-seconds.  Patient experienced severe coughing with Presyncopal sensation while SOB, 150 mg of IV Aminophylline was administered with  resolution of symptoms.  There were no significant changes with Lexiscan.  Quantitative spect images were obtained after a 45 minute delay.  Transient Ischemic Dilatation (Normal <1.22):  1.0 Lung/Heart Ratio (Normal <0.45):  0.38 QGS EDV:  93 ml QGS ESV:  36 ml LV Ejection Fraction: 61%       Rest ECG: NSR - Normal EKG  Stress ECG: No significant change from baseline ECG  QPS Raw Data Images:  Mild diaphragmatic attenuation.  Normal left ventricular size. Stress Images:  Normal homogeneous uptake in all areas of the myocardium. Rest Images:  Normal homogeneous uptake in all areas of the myocardium. Subtraction (SDS):  No evidence of ischemia.  Impression Exercise Capacity:  Lexiscan with no exercise. BP Response:  Normal blood pressure response. Clinical Symptoms:  No significant symptoms noted. ECG Impression:  No significant ECG changes with Lexiscan. Comparison with Prior Nuclear Study: No significant change from previous study  Overall Impression:  Normal stress nuclear study.  LV Wall Motion:  NL LV Function; NL Wall Motion   Annabeth Tortora, MD  05/04/2012 12:15 PM

## 2012-06-26 LAB — PACEMAKER DEVICE OBSERVATION

## 2012-08-13 ENCOUNTER — Encounter: Payer: Self-pay | Admitting: Cardiovascular Disease

## 2012-09-02 ENCOUNTER — Encounter: Payer: Self-pay | Admitting: *Deleted

## 2012-09-06 ENCOUNTER — Ambulatory Visit: Payer: Medicare Other | Admitting: Cardiovascular Disease

## 2012-09-25 ENCOUNTER — Encounter: Payer: Self-pay | Admitting: Cardiovascular Disease

## 2012-09-28 NOTE — Progress Notes (Signed)
Quick Note:  This is a result from January. Should have already been addressed through the sleep lab. They make all referrals and follow up titration appointments for the patients. ______

## 2012-11-02 ENCOUNTER — Other Ambulatory Visit: Payer: Self-pay | Admitting: Cardiovascular Disease

## 2012-11-02 LAB — CBC WITH DIFFERENTIAL/PLATELET
Eosinophils Absolute: 0.2 10*3/uL (ref 0.0–0.7)
HCT: 40.5 % (ref 39.0–52.0)
Hemoglobin: 13.6 g/dL (ref 13.0–17.0)
Lymphs Abs: 1.8 10*3/uL (ref 0.7–4.0)
MCH: 32 pg (ref 26.0–34.0)
Monocytes Absolute: 0.7 10*3/uL (ref 0.1–1.0)
Monocytes Relative: 5 % (ref 3–12)
Neutro Abs: 10.6 10*3/uL — ABNORMAL HIGH (ref 1.7–7.7)
Neutrophils Relative %: 79 % — ABNORMAL HIGH (ref 43–77)
RBC: 4.25 MIL/uL (ref 4.22–5.81)

## 2012-11-02 LAB — COMPREHENSIVE METABOLIC PANEL
Albumin: 3.1 g/dL — ABNORMAL LOW (ref 3.5–5.2)
BUN: 18 mg/dL (ref 6–23)
CO2: 26 mEq/L (ref 19–32)
Glucose, Bld: 81 mg/dL (ref 70–99)
Potassium: 4.8 mEq/L (ref 3.5–5.3)
Sodium: 139 mEq/L (ref 135–145)
Total Protein: 6.7 g/dL (ref 6.0–8.3)

## 2012-11-02 LAB — TSH: TSH: 2.388 u[IU]/mL (ref 0.350–4.500)

## 2012-11-02 LAB — T4, FREE: Free T4: 1.25 ng/dL (ref 0.80–1.80)

## 2012-11-05 ENCOUNTER — Ambulatory Visit: Payer: Medicare Other | Admitting: Cardiovascular Disease

## 2012-11-16 ENCOUNTER — Other Ambulatory Visit: Payer: Self-pay | Admitting: Cardiovascular Disease

## 2012-11-16 LAB — COMPREHENSIVE METABOLIC PANEL
Albumin: 3.6 g/dL (ref 3.5–5.2)
CO2: 27 mEq/L (ref 19–32)
Calcium: 9.1 mg/dL (ref 8.4–10.5)
Chloride: 100 mEq/L (ref 96–112)
Glucose, Bld: 142 mg/dL — ABNORMAL HIGH (ref 70–99)
Potassium: 4.4 mEq/L (ref 3.5–5.3)
Sodium: 137 mEq/L (ref 135–145)
Total Protein: 7 g/dL (ref 6.0–8.3)

## 2012-11-16 LAB — TSH: TSH: 2.482 u[IU]/mL (ref 0.350–4.500)

## 2012-11-16 LAB — PACEMAKER DEVICE OBSERVATION

## 2012-11-20 ENCOUNTER — Emergency Department (HOSPITAL_COMMUNITY): Payer: Medicare Other

## 2012-11-20 ENCOUNTER — Encounter (HOSPITAL_COMMUNITY): Payer: Self-pay | Admitting: *Deleted

## 2012-11-20 ENCOUNTER — Observation Stay (HOSPITAL_COMMUNITY)
Admission: EM | Admit: 2012-11-20 | Discharge: 2012-11-22 | Disposition: A | Discharge status: Home / Self Care | Payer: Medicare Other | Attending: Internal Medicine | Admitting: Internal Medicine

## 2012-11-20 DIAGNOSIS — R1319 Other dysphagia: Secondary | ICD-10-CM

## 2012-11-20 DIAGNOSIS — I4891 Unspecified atrial fibrillation: Secondary | ICD-10-CM

## 2012-11-20 DIAGNOSIS — R55 Syncope and collapse: Secondary | ICD-10-CM

## 2012-11-20 DIAGNOSIS — E1165 Type 2 diabetes mellitus with hyperglycemia: Secondary | ICD-10-CM | POA: Diagnosis present

## 2012-11-20 DIAGNOSIS — I455 Other specified heart block: Secondary | ICD-10-CM

## 2012-11-20 DIAGNOSIS — R079 Chest pain, unspecified: Principal | ICD-10-CM

## 2012-11-20 DIAGNOSIS — K7460 Unspecified cirrhosis of liver: Secondary | ICD-10-CM

## 2012-11-20 DIAGNOSIS — J441 Chronic obstructive pulmonary disease with (acute) exacerbation: Secondary | ICD-10-CM

## 2012-11-20 DIAGNOSIS — R490 Dysphonia: Secondary | ICD-10-CM

## 2012-11-20 DIAGNOSIS — R1011 Right upper quadrant pain: Secondary | ICD-10-CM

## 2012-11-20 DIAGNOSIS — K2900 Acute gastritis without bleeding: Secondary | ICD-10-CM

## 2012-11-20 DIAGNOSIS — G4734 Idiopathic sleep related nonobstructive alveolar hypoventilation: Secondary | ICD-10-CM | POA: Diagnosis present

## 2012-11-20 DIAGNOSIS — E119 Type 2 diabetes mellitus without complications: Secondary | ICD-10-CM

## 2012-11-20 DIAGNOSIS — R109 Unspecified abdominal pain: Secondary | ICD-10-CM

## 2012-11-20 DIAGNOSIS — I5031 Acute diastolic (congestive) heart failure: Secondary | ICD-10-CM

## 2012-11-20 DIAGNOSIS — E669 Obesity, unspecified: Secondary | ICD-10-CM

## 2012-11-20 DIAGNOSIS — M1990 Unspecified osteoarthritis, unspecified site: Secondary | ICD-10-CM

## 2012-11-20 DIAGNOSIS — I4892 Unspecified atrial flutter: Secondary | ICD-10-CM

## 2012-11-20 DIAGNOSIS — F17200 Nicotine dependence, unspecified, uncomplicated: Secondary | ICD-10-CM

## 2012-11-20 DIAGNOSIS — G4730 Sleep apnea, unspecified: Secondary | ICD-10-CM

## 2012-11-20 DIAGNOSIS — K219 Gastro-esophageal reflux disease without esophagitis: Secondary | ICD-10-CM

## 2012-11-20 DIAGNOSIS — K859 Acute pancreatitis without necrosis or infection, unspecified: Secondary | ICD-10-CM

## 2012-11-20 DIAGNOSIS — R0602 Shortness of breath: Secondary | ICD-10-CM | POA: Insufficient documentation

## 2012-11-20 LAB — MAGNESIUM: Magnesium: 1.8 mg/dL (ref 1.5–2.5)

## 2012-11-20 LAB — PROTIME-INR: INR: 1 (ref 0.00–1.49)

## 2012-11-20 LAB — PRO B NATRIURETIC PEPTIDE: Pro B Natriuretic peptide (BNP): 69 pg/mL (ref 0–125)

## 2012-11-20 LAB — TROPONIN I: Troponin I: <0.3 ng/mL (ref <0.30)

## 2012-11-20 LAB — COMPREHENSIVE METABOLIC PANEL
ALT: 44 U/L (ref 0–53)
Albumin: 3.4 g/dL — ABNORMAL LOW (ref 3.5–5.2)
Calcium: 9.8 mg/dL (ref 8.4–10.5)
GFR calc Af Amer: >90 mL/min (ref >90)
Glucose, Bld: 193 mg/dL — ABNORMAL HIGH (ref 70–99)
Potassium: 4 mEq/L (ref 3.5–5.1)
Sodium: 134 mEq/L — ABNORMAL LOW (ref 135–145)
Total Protein: 8.3 g/dL (ref 6.0–8.3)

## 2012-11-20 LAB — CBC
Hemoglobin: 14.8 g/dL (ref 13.0–17.0)
MCH: 32.7 pg (ref 26.0–34.0)
MCHC: 33.9 g/dL (ref 30.0–36.0)
RDW: 13.4 % (ref 11.5–15.5)

## 2012-11-20 MED ORDER — GUAIFENESIN ER 600 MG PO TB12
1200.0000 mg | ORAL_TABLET | Freq: Two times a day (BID) | ORAL | Status: DC
  Administered 2012-11-20 – 2012-11-22 (×4): 1200 mg via ORAL
  Filled 2012-11-20 (×4): qty 2

## 2012-11-20 MED ORDER — SODIUM CHLORIDE 0.9 % IV SOLN
Freq: Once | INTRAVENOUS | Status: AC
  Administered 2012-11-20: 16:00:00 via INTRAVENOUS

## 2012-11-20 MED ORDER — ONDANSETRON HCL 4 MG PO TABS: 4.0000 mg | ORAL_TABLET | Freq: Four times a day (QID) | ORAL | Status: DC | PRN

## 2012-11-20 MED ORDER — METFORMIN HCL 850 MG PO TABS
850.0000 mg | ORAL_TABLET | Freq: Two times a day (BID) | ORAL | Status: DC
  Administered 2012-11-21 – 2012-11-22 (×3): 850 mg via ORAL
  Filled 2012-11-20 (×5): qty 1

## 2012-11-20 MED ORDER — DOCUSATE SODIUM 100 MG PO CAPS
100.0000 mg | ORAL_CAPSULE | Freq: Two times a day (BID) | ORAL | Status: DC
  Administered 2012-11-20 – 2012-11-22 (×4): 100 mg via ORAL
  Filled 2012-11-20 (×4): qty 1

## 2012-11-20 MED ORDER — ASPIRIN 81 MG PO CHEW
324.0000 mg | CHEWABLE_TABLET | Freq: Once | ORAL | Status: AC
  Administered 2012-11-20: 324 mg via ORAL
  Filled 2012-11-20: qty 4

## 2012-11-20 MED ORDER — POTASSIUM CHLORIDE CRYS ER 10 MEQ PO TBCR
10.0000 meq | EXTENDED_RELEASE_TABLET | Freq: Every morning | ORAL | Status: DC
  Administered 2012-11-21 – 2012-11-22 (×2): 10 meq via ORAL
  Filled 2012-11-20 (×3): qty 1

## 2012-11-20 MED ORDER — LORATADINE 10 MG PO TABS
10.0000 mg | ORAL_TABLET | Freq: Every day | ORAL | Status: DC
  Administered 2012-11-20 – 2012-11-22 (×3): 10 mg via ORAL
  Filled 2012-11-20 (×3): qty 1

## 2012-11-20 MED ORDER — ENOXAPARIN SODIUM 40 MG/0.4ML ~~LOC~~ SOLN
40.0000 mg | SUBCUTANEOUS | Status: DC
  Administered 2012-11-21: 40 mg via SUBCUTANEOUS
  Filled 2012-11-20: qty 0.4

## 2012-11-20 MED ORDER — BUPROPION HCL ER (SR) 150 MG PO TB12
150.0000 mg | ORAL_TABLET | Freq: Two times a day (BID) | ORAL | Status: DC
  Administered 2012-11-20 – 2012-11-22 (×4): 150 mg via ORAL
  Filled 2012-11-20 (×7): qty 1

## 2012-11-20 MED ORDER — IPRATROPIUM BROMIDE 0.02 % IN SOLN
0.5000 mg | Freq: Four times a day (QID) | RESPIRATORY_TRACT | Status: DC
  Administered 2012-11-20 – 2012-11-22 (×7): 0.5 mg via RESPIRATORY_TRACT
  Filled 2012-11-20 (×8): qty 2.5

## 2012-11-20 MED ORDER — FUROSEMIDE 20 MG PO TABS
20.0000 mg | ORAL_TABLET | Freq: Every morning | ORAL | Status: DC
  Administered 2012-11-21 – 2012-11-22 (×2): 20 mg via ORAL
  Filled 2012-11-20 (×2): qty 1

## 2012-11-20 MED ORDER — INSULIN GLARGINE 100 UNIT/ML ~~LOC~~ SOLN
105.0000 [IU] | Freq: Every day | SUBCUTANEOUS | Status: DC
  Filled 2012-11-20: qty 1.05

## 2012-11-20 MED ORDER — SODIUM CHLORIDE 0.9 % IJ SOLN
3.0000 mL | Freq: Two times a day (BID) | INTRAMUSCULAR | Status: DC
  Administered 2012-11-20 – 2012-11-22 (×4): 3 mL via INTRAVENOUS

## 2012-11-20 MED ORDER — INSULIN GLARGINE 100 UNIT/ML ~~LOC~~ SOLN
SUBCUTANEOUS | Status: AC
  Filled 2012-11-20: qty 10

## 2012-11-20 MED ORDER — FLUTICASONE PROPIONATE 50 MCG/ACT NA SUSP
1.0000 | Freq: Every day | NASAL | Status: DC
  Administered 2012-11-21 – 2012-11-22 (×2): 1 via NASAL
  Filled 2012-11-20: qty 16

## 2012-11-20 MED ORDER — ONDANSETRON HCL 4 MG/2ML IJ SOLN: 4.0000 mg | Freq: Four times a day (QID) | INTRAMUSCULAR | Status: DC | PRN

## 2012-11-20 MED ORDER — PANTOPRAZOLE SODIUM 40 MG PO TBEC
40.0000 mg | DELAYED_RELEASE_TABLET | Freq: Two times a day (BID) | ORAL | Status: DC
  Administered 2012-11-20 – 2012-11-22 (×4): 40 mg via ORAL
  Filled 2012-11-20 (×4): qty 1

## 2012-11-20 MED ORDER — DILTIAZEM HCL ER COATED BEADS 180 MG PO CP24
360.0000 mg | ORAL_CAPSULE | Freq: Every morning | ORAL | Status: DC
  Administered 2012-11-21 – 2012-11-22 (×2): 360 mg via ORAL
  Filled 2012-11-20 (×2): qty 2

## 2012-11-20 MED ORDER — ALBUTEROL SULFATE (5 MG/ML) 0.5% IN NEBU
2.5000 mg | INHALATION_SOLUTION | Freq: Four times a day (QID) | RESPIRATORY_TRACT | Status: DC
  Administered 2012-11-20 – 2012-11-22 (×7): 2.5 mg via RESPIRATORY_TRACT
  Filled 2012-11-20 (×8): qty 0.5

## 2012-11-20 MED ORDER — ASPIRIN EC 81 MG PO TBEC
81.0000 mg | DELAYED_RELEASE_TABLET | Freq: Every day | ORAL | Status: DC
  Administered 2012-11-21 – 2012-11-22 (×2): 81 mg via ORAL
  Filled 2012-11-20 (×2): qty 1

## 2012-11-20 MED ORDER — MORPHINE SULFATE 4 MG/ML IJ SOLN
4.0000 mg | Freq: Once | INTRAMUSCULAR | Status: AC
  Administered 2012-11-20: 4 mg via INTRAVENOUS
  Filled 2012-11-20: qty 1

## 2012-11-20 MED ORDER — SIMVASTATIN 20 MG PO TABS
20.0000 mg | ORAL_TABLET | Freq: Every evening | ORAL | Status: DC
  Administered 2012-11-20 – 2012-11-21 (×2): 20 mg via ORAL
  Filled 2012-11-20 (×2): qty 1

## 2012-11-20 MED ORDER — BUPROPION HCL ER (SR) 150 MG PO TB12
ORAL_TABLET | ORAL | Status: AC
  Filled 2012-11-20: qty 1

## 2012-11-20 MED ORDER — ONDANSETRON HCL 4 MG/2ML IJ SOLN: 4.0000 mg | Freq: Three times a day (TID) | INTRAMUSCULAR | Status: DC | PRN

## 2012-11-20 MED ORDER — OXYCODONE HCL 5 MG PO TABS
5.0000 mg | ORAL_TABLET | ORAL | Status: DC | PRN
  Administered 2012-11-20: 5 mg via ORAL
  Filled 2012-11-20: qty 1

## 2012-11-20 MED ORDER — SODIUM CHLORIDE 0.9 % IV SOLN: 20.0000 mL | INTRAVENOUS | Status: DC

## 2012-11-20 MED ORDER — MORPHINE SULFATE 4 MG/ML IJ SOLN: 4.0000 mg | INTRAMUSCULAR | Status: DC | PRN

## 2012-11-20 MED ORDER — INSULIN ASPART 100 UNIT/ML ~~LOC~~ SOLN: 0.0000 [IU] | Freq: Every day | SUBCUTANEOUS | Status: DC

## 2012-11-20 MED ORDER — INSULIN ASPART 100 UNIT/ML ~~LOC~~ SOLN
0.0000 [IU] | Freq: Three times a day (TID) | SUBCUTANEOUS | Status: DC
  Administered 2012-11-21: 9 [IU] via SUBCUTANEOUS

## 2012-11-20 NOTE — ED Notes (Signed)
Pt c/o mid center chest pain that radiates to neck area, sob, n/v diaphoresis that started this afternoon, did take one baby aspirin prior to arrival in er,

## 2012-11-20 NOTE — ED Provider Notes (Signed)
CSN: [id number]     Arrival date & time 11/20/12  1401 History    This chart was scribed for Shanna Cisco, MD, by Yevette Edwards, ED Scribe. This patient was seen in room APA01/APA01 and the patient's care was started at 2:52 PM.   First MD Initiated Contact with Patient 11/20/12 1446     Chief Complaint  Patient presents with  . Chest Pain    Patient is a 62 y.o. male presenting with chest pain. The history is provided by the patient. No language interpreter was used.  Chest Pain Pain location:  Substernal area Pain quality: pressure   Pain radiates to:  R shoulder (Neck) Pain radiates to the back: no   Pain severity:  Moderate Duration:  3 hours Timing:  Constant Progression:  Improving Chronicity:  Recurrent Relieved by:  Nothing Worsened by:  Nothing tried Ineffective treatments:  Aspirin and rest Associated symptoms: cough (Baseline), diaphoresis, nausea and shortness of breath   Associated symptoms: no abdominal pain, no fever and not vomiting    HPI Comments: Ralph [name] is a 62 y.o. male, with a h/o left-sided heart failure and a pacemaker, who presents to the Emergency Department complaining of constant chest pain which began four hours ago. The pt states the pain was "pressure" and it radiates up his neck and minimally into his right shoulder.  He describes the feeliing as if "his heart is pounding itself to death." At its worst, the pt states the pain was a 5/10. Currently, the pain is a 3/10. The pt attempted to mitigate his symptoms with a baby aspirin which did not resolve the symptoms. He states that nothing worsens or lessens the chest pain. The pt reports that he has  experienced intermittent chest pain, occurring approximately every other day, for approximately a month; he reports that the episodes last from a few minutes to an hour. This current episode of chest pain is anomalous as it has lasted three hours. He reports that he has  also experienced nausea,  diaphoresis, SOB, and a cough. The pt reports the SOB is a new symptom. He states that he has COPD, and the cough is baseline and he produced some clear sputum with the cough. Swelling to his lower extremities is also baseline.  The pt denies experiencing any fever, emesis, diarrhea, or abdominal pain. The pt visited Dr. Johnell Comings with [facility] four days ago. The pt reports that he has not experienced any shocks from his pacemaker, even though Dr. Johnell Comings told him at the appointment that his heart beats were not in control. He received the pacemaker September 2013.  He denies cardiac stents as well as open heart surgery.   Dr. Regino Schultze is the pt's PCP.   Past Medical History  Diagnosis Date  . Hypertension   . Cirrhosis   . Depressed   . Varicose vein     of esophagus  . Difficult intubation     "trouble waking up afterwards" (01/06/2012)  . CHF (congestive heart failure) 01/06/2012    "found out I have this today"  . Sinus pause 01/06/2012    5.2 seconds  . Anginal pain   . Sleep apnea     "don't wear mask" (01/06/2012)  . Emphysema   . Type II diabetes mellitus   . GERD (gastroesophageal reflux disease)   . H/O hiatal hernia   . Coughing up blood     "comes from my throat" (01/06/2012)  . Arthritis     "  back; fingers" (01/06/2012)  . Chronic lower back pain   . Fatty liver disease, nonalcoholic   . CHB (complete heart block)   . Presence of permanent cardiac pacemaker 9/292013    [facility]   Past Surgical History  Procedure Laterality Date  . Back surgery    . Spinal cord stimulator implant  2006  . Cholecystectomy  1993  . Nasal septum surgery  1992  . Tonsillectomy and adenoidectomy  1992  . Posterior fusion lumbar spine  1999    L4-5  . Lumbar disc surgery  1994; ~ 1995; ~ 1996  . Warthin's tumor excision  1990's    right  . Permanent pacemaker insertion  01/08/2012    CHB  . US echocardiography  12/28/2011    mild LVH,mild mitral annulara ca+,mild MR  .  Nuclear stress test  10/19/2004    No ichemia   Family History  Problem Relation Age of Onset  . Diabetes Neg Hx    History  Substance Use Topics  . Smoking status: Current Every Day Smoker -- 1.00 packs/day for 45 years    Types: Cigarettes  . Smokeless tobacco: Former Neurosurgeon    Quit date: 11/13/2012     Comment: using Welbutrin and e-cigarettes  . Alcohol Use: No     Comment: 01/06/2012 "quit alcohol 2011"    Review of Systems  Constitutional: Positive for diaphoresis. Negative for fever.  Respiratory: Positive for cough (Baseline) and shortness of breath.   Cardiovascular: Positive for chest pain and leg swelling (Baseline).  Gastrointestinal: Positive for nausea. Negative for vomiting, abdominal pain and diarrhea.  All other systems reviewed and are negative.    Allergies  Nitroglycerin  Home Medications   No current outpatient prescriptions on file. Triage Vitals: BP 117/71  Pulse 109  Temp(Src) 98.1 F (36.7 C) (Oral)  Resp 27  Ht 5\' 10"  (1.778 m)  Wt 257 lb (116.574 kg)  BMI 36.88 kg/m2  SpO2 91%  Physical Exam  Nursing note and vitals reviewed. Constitutional: He is oriented to person, place, and time. He appears well-developed and well-nourished. No distress.  HENT:  Head: Normocephalic and atraumatic.  Mouth/Throat: No oropharyngeal exudate.  Eyes: Pupils are equal, round, and reactive to light.  Neck: Normal range of motion. Neck supple.  Cardiovascular: Normal rate and normal heart sounds.  Exam reveals no gallop and no friction rub.   No murmur heard. Irregular  Pulmonary/Chest: Effort normal. No respiratory distress. He has no wheezes. He has no rales.  Slight crackles at right base.   Abdominal: Soft. Bowel sounds are normal. He exhibits no distension and no mass. There is no tenderness. There is no rebound and no guarding.  Musculoskeletal: Normal range of motion. He exhibits edema. He exhibits no tenderness.  1 + edema to lower extremities  bilaterally.   Neurological: He is alert and oriented to person, place, and time.  Skin: Skin is warm and dry.  Psychiatric: He has a normal mood and affect.    ED Course   DIAGNOSTIC STUDIES:  Oxygen Saturation is 91% on room air, normal by my interpretation.    COORDINATION OF CARE:  3:01 PM- Discussed treatment plan with patient, and the patient agreed to the plan.   Procedures (including critical care time)  Labs Reviewed  CBC - Abnormal; Notable for the following:    WBC 13.9 (*)    All other components within normal limits  COMPREHENSIVE METABOLIC PANEL - Abnormal; Notable for the following:  Sodium 134 (*)    Glucose, Bld 193 (*)    BUN 24 (*)    Albumin 3.4 (*)    Alkaline Phosphatase 318 (*)    All other components within normal limits  PRO B NATRIURETIC PEPTIDE  MAGNESIUM  PROTIME-INR  TROPONIN I  TROPONIN I  URINALYSIS, ROUTINE W REFLEX MICROSCOPIC  TSH  HEMOGLOBIN A1C  CBC  MAGNESIUM  COMPREHENSIVE METABOLIC PANEL  TROPONIN I  TROPONIN I  POCT I-STAT TROPONIN I   Dg Chest Portable 1 View  11/20/2012   *RADIOLOGY REPORT*  Clinical Data: Chest pain  PORTABLE CHEST - 1 VIEW  Comparison: 01/10/2012  Findings: The spinal stimulator and pacemaker are again noted and stable.  Cardiac shadow is within normal limits.  Vascular congestion is seen on top of chronic interstitial changes.  No focal infiltrate or sizable effusion is noted.  IMPRESSION: Mild vascular congestion.   Original Report Authenticated By: Alcide Clever, [name]   1. Chest pain     18:15 PM Have spoken w/ cardiologist on call who agrees with plan to admit to hospitalist service, cycle troponin  MDM  Pt is a 61 y.o. male with Pmhx as above who presents with constant midsternal CP since 11:30am w/ assoc nausea, diaphoresis, SOB, radiation to neck.  He has had short episodes of CP QOD for about 1 month.  Sinus tach on EKG, no ST changes, stable cardiomegaly on CXR, first trop negative. Pain  resolved after 1 dose IV morphine (allergy to NTG).  Have spoken to cardiology, plan to admit to hospitalist for ACS r/o.   1. Chest pain       I personally performed the services described in this documentation, which was scribed in my presence. The recorded information has been reviewed and is accurate.    Shanna Cisco, MD 11/20/12 2156

## 2012-11-20 NOTE — ED Notes (Signed)
Daily recurrent chest pain began today around 02-1199.  Had severe acid reflux last night, took baby ASA which resolved pain.  Pain occurred when getting up to get drink - 2 hours post prandial.  This afternoon again occurred suddenly, not associated w/eating.  Became SOB, lightheaded, diaphoretic.   Chest pain at present is 5/10, mid chest pressure radiating up to throat.  Occasional brief stabbing pain.

## 2012-11-20 NOTE — ED Notes (Signed)
Notified Dr. Micheline Maze of i-stat Troponin 0.00

## 2012-11-20 NOTE — H&P (Signed)
[facility] History and Physical  [name]  [name]:[id number]  DOB: 1950-09-19   DOA: 11/20/2012   PCP:   [name], MD   Chief Complaint:  Palpitations today  HPI: Ralph [name] is a 62 y.o. male.   Morbidly obese Caucasian gentleman with a pacemaker for complete heart block, obstructive sleep apnea noncompliant because of sinusitis issues, reports that around 11:30 today he developed a sudden pounding in his chest associated with chest pain, and he used his automatic blood pressure cuff to check his heart rate and found it to be 180; he has had similar episodes about every other day for the past 2 months, which responded to simple rest, so he took a baby aspirin and rested. After a couple of hours he was not improved and so he drove himself to the emergency room.  In the emergency room, he was in sinus tachycardia about 140, he did not tell the emergency room physician about the pounding of his chest and measuring of his own heart rate but instead only discussed the chest pain. The pain radiated up into his throat and his right shoulder, and is associated with nausea diaphoresis and dry heaves.; He rated the pain as a 7/10 on arrival to the emergency room, and he received intravenous morphine which brought it down to a 2-3/10. He is allergic to nitroglycerin. The pain persist slightly under his left breast.  The emergency room physician discussed his chest pain with the cardiologist on-call and they recommended admission to [facility].  He reports the sudden onset of chest pain and palpitations was not related to use of his albuterol inhaler Non-compliant with Cpap due to mucous problems; Rewiew of Systems:   All systems negative except as marked bold or noted in the HPI;  Constitutional:    malaise, fever and chills. ;  Eyes:   eye pain, redness and discharge. ;  ENMT:   ear pain, hoarseness, nasal congestion, sinus pressure and sore throat. ;  Cardiovascular:      peripheral edema.  Respiratory:   Chronic cough, occaisionally hemoptysis, wheezing and stridor. ;  Gastrointestinal:  nausea, vomiting, diarrhea, constipation, abdominal pain, melena, blood in stool, hematemesis, jaundice and rectal bleeding. unusual weight loss..   Genitourinary:    frequency, dysuria, incontinence,flank pain and hematuria; Musculoskeletal:   back pain and neck pain.  swelling and trauma.;  Skin: .  pruritus, rash, abrasions, bruising and skin lesion.; ulcerations Neuro:    headache, lightheadedness and neck stiffness.  weakness, altered level of consciousness, altered mental status, extremity weakness, burning feet, involuntary movement, seizure and syncope.  Psych:    anxiety, depression, insomnia, tearfulness, panic attacks, hallucinations, paranoia, suicidal or homicidal ideation.   Past Medical History  Diagnosis Date  . Hypertension   . Cirrhosis   . Depressed   . Varicose vein     of esophagus  . Difficult intubation     "trouble waking up afterwards" (01/06/2012)  . CHF (congestive heart failure) 01/06/2012    "found out I have this today"  . Sinus pause 01/06/2012    5.2 seconds  . Anginal pain   . Sleep apnea     "don't wear mask" (01/06/2012)  . Emphysema   . Type II diabetes mellitus   . GERD (gastroesophageal reflux disease)   . H/O hiatal hernia   . Coughing up blood     "comes from my throat" (01/06/2012)  . Arthritis     "back; fingers" (01/06/2012)  .  Chronic lower back pain   . Fatty liver disease, nonalcoholic   . CHB (complete heart block)   . Presence of permanent cardiac pacemaker 9/292013    [employer]    Past Surgical History  Procedure Laterality Date  . Back surgery    . Spinal cord stimulator implant  2006  . Cholecystectomy  1993  . Nasal septum surgery  1992  . Tonsillectomy and adenoidectomy  1992  . Posterior fusion lumbar spine  1999    L4-5  . Lumbar disc surgery  1994; ~ 1995; ~ 1996  . Warthin's tumor excision  1990's     right  . Permanent pacemaker insertion  01/08/2012    CHB  . US echocardiography  12/28/2011    mild LVH,mild mitral annulara ca+,mild MR  . Nuclear stress test  10/19/2004    No ichemia    Medications:  HOME MEDS: Prior to Admission medications   Medication Sig Start Date End Date Taking? Authorizing Provider  albuterol (PROVENTIL HFA;VENTOLIN HFA) 108 (90 BASE) MCG/ACT inhaler Inhale 2 puffs into the lungs every 6 (six) hours as needed for wheezing.   Yes Historical Provider, MD  aspirin EC 81 MG tablet Take 81 mg by mouth daily.   Yes Historical Provider, MD  buPROPion (WELLBUTRIN SR) 150 MG 12 hr tablet Take 150 mg by mouth 2 (two) times daily.   Yes Historical Provider, MD  diltiazem (CARDIZEM CD) 360 MG 24 hr capsule Take 360 mg by mouth every morning.   Yes Historical Provider, MD  furosemide (LASIX) 20 MG tablet Take 20 mg by mouth every morning.   Yes Historical Provider, MD  insulin glargine (LANTUS) 100 UNIT/ML injection Inject 105 Units into the skin daily.    Yes Historical Provider, MD  metFORMIN (GLUCOPHAGE) 850 MG tablet Take 850 mg by mouth 2 (two) times daily.   Yes Historical Provider, MD  pantoprazole (PROTONIX) 40 MG tablet Take 40 mg by mouth every morning.   Yes Historical Provider, MD  potassium chloride (K-DUR) 10 MEQ tablet Take 10 mEq by mouth every morning.   Yes Historical Provider, MD  simvastatin (ZOCOR) 20 MG tablet Take 20 mg by mouth every evening.   Yes Historical Provider, MD  tiotropium (SPIRIVA) 18 MCG inhalation capsule Place 18 mcg into inhaler and inhale every morning.    Yes Historical Provider, MD     Allergies:  Allergies  Allergen Reactions  . Nitroglycerin Hives, Swelling and Rash    Social History:   reports that he has been smoking Cigarettes.  He has a 45 pack-year smoking history. He does not have any smokeless tobacco history on file. He reports that  drinks alcohol. He reports that he uses illicit drugs (Marijuana).  Family  History: Family History  Problem Relation Age of Onset  . Diabetes Neg Hx      Physical Exam: Filed Vitals:   11/20/12 1800 11/20/12 1900 11/20/12 1936 11/20/12 1942  BP: 122/66 123/63 152/79   Pulse: 74 74 74   Temp:   98.4 F (36.9 C)   TempSrc:   Oral   Resp: 21 26 26    Height:      Weight:    117.935 kg (260 lb)  SpO2: 96% 95% 95%    Blood pressure 152/79, pulse 74, temperature 98.4 F (36.9 C), temperature source Oral, resp. rate 26, height 5\' 10"  (1.778 m), weight 117.935 kg (260 lb), SpO2 95.00%. Body mass index is 37.31 kg/(m^2).   GEN:  Pleasant obese  Caucasian gentleman lying bed breathing noisily and rapidly, but not acutely distressed; cooperative with exam, but somewhat irritated with questions PSYCH:  alert and oriented x4;  neither anxious nor depressed; affect is appropriate. HEENT: Mucous membranes pink and anicteric; PERRLA; EOM intact; thick neck Breasts:: Not examined CHEST WALL: No tenderness CHEST: Tachypnea; clear to auscultation bilaterally HEART: Regular rate and rhythm; not no murmurs rubs or gallops BACK:  no CVA tenderness ABDOMEN: Obese, soft non-tender; no masses, no organomegaly, normal abdominal bowel sounds; moderate pannus; no intertriginous candida. Rectal Exam: Not done EXTREMITIES: age-appropriate arthropathy of the hands and knees; no edema; no ulcerations. Genitalia: not examined PULSES: 2+ and symmetric SKIN: Normal hydration no rash or ulceration CNS: Cranial nerves 2-12 grossly intact no focal lateralizing neurologic deficit   Labs on Admission:  Basic Metabolic Panel:  Recent Labs Lab 11/16/12 1056 11/20/12 1430  NA 137 134*  K 4.4 4.0  CL 100 96  CO2 27 23  GLUCOSE 142* 193*  BUN 17 24*  CREATININE 0.84 0.92  CALCIUM 9.1 9.8  MG  --  1.8   Liver Function Tests:  Recent Labs Lab 11/16/12 1056 11/20/12 1430  AST 21 28  ALT 37 44  ALKPHOS 302* 318*  BILITOT 0.6 0.4  PROT 7.0 8.3  ALBUMIN 3.6 3.4*   No  results found for this basename: LIPASE, AMYLASE,  in the last 168 hours No results found for this basename: AMMONIA,  in the last 168 hours CBC:  Recent Labs Lab 11/20/12 1430  WBC 13.9*  HGB 14.8  HCT 43.7  MCV 96.5  PLT 400   Cardiac Enzymes:  Recent Labs Lab 11/20/12 1421 11/20/12 1755  TROPONINI <0.30 <0.30   BNP: No components found with this basename: POCBNP,  D-dimer: No components found with this basename: D-DIMER,  CBG: No results found for this basename: GLUCAP,  in the last 168 hours  Radiological Exams on Admission: Dg Chest Portable 1 View  11/20/2012   *RADIOLOGY REPORT*  Clinical Data: Chest pain  PORTABLE CHEST - 1 VIEW  Comparison: 01/10/2012  Findings: The spinal stimulator and pacemaker are again noted and stable.  Cardiac shadow is within normal limits.  Vascular congestion is seen on top of chronic interstitial changes.  No focal infiltrate or sizable effusion is noted.  IMPRESSION: Mild vascular congestion.   Original Report Authenticated By: Alcide Clever, [name]    EKG: Independently reviewed. 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 Tach; he may benefit from an echo and a cardiology consult Nutrition counseling, Every 6 hours nebs which may be more comfortable for him Continue Lantus with his current regimen and add a sliding He quit smoking one week ago and he was encouraged with that  Other plans as per orders.  Code Status: Full code Family Communication: Her family at bedside Disposition Plan: Likely home in a day or  2    Lydiah Pong Nocturnist [facility] Pager [phone]   11/20/2012, 8:33 PM

## 2012-11-21 DIAGNOSIS — I4892 Unspecified atrial flutter: Secondary | ICD-10-CM

## 2012-11-21 DIAGNOSIS — I4891 Unspecified atrial fibrillation: Secondary | ICD-10-CM

## 2012-11-21 DIAGNOSIS — I517 Cardiomegaly: Secondary | ICD-10-CM

## 2012-11-21 DIAGNOSIS — K219 Gastro-esophageal reflux disease without esophagitis: Secondary | ICD-10-CM

## 2012-11-21 DIAGNOSIS — F17200 Nicotine dependence, unspecified, uncomplicated: Secondary | ICD-10-CM

## 2012-11-21 LAB — COMPREHENSIVE METABOLIC PANEL
Albumin: 3.1 g/dL — ABNORMAL LOW (ref 3.5–5.2)
BUN: 20 mg/dL (ref 6–23)
Chloride: 97 mEq/L (ref 96–112)
Creatinine, Ser: 0.79 mg/dL (ref 0.50–1.35)
GFR calc Af Amer: >90 mL/min (ref >90)
GFR calc non Af Amer: >90 mL/min (ref >90)
Glucose, Bld: 100 mg/dL — ABNORMAL HIGH (ref 70–99)
Total Bilirubin: 0.6 mg/dL (ref 0.3–1.2)

## 2012-11-21 LAB — GLUCOSE, CAPILLARY: Glucose-Capillary: 123 mg/dL — ABNORMAL HIGH (ref 70–99)

## 2012-11-21 LAB — CBC
MCH: 30.5 pg (ref 26.0–34.0)
MCV: 97.5 fL (ref 78.0–100.0)
Platelets: 358 10*3/uL (ref 150–400)
RBC: 4.4 MIL/uL (ref 4.22–5.81)
RDW: 13.5 % (ref 11.5–15.5)

## 2012-11-21 LAB — URINALYSIS, ROUTINE W REFLEX MICROSCOPIC
Ketones, ur: NEGATIVE mg/dL
Leukocytes, UA: NEGATIVE
Nitrite: NEGATIVE
Protein, ur: NEGATIVE mg/dL
pH: 5.5 (ref 5.0–8.0)

## 2012-11-21 LAB — URINE MICROSCOPIC-ADD ON

## 2012-11-21 LAB — HEMOGLOBIN A1C: Mean Plasma Glucose: 140 mg/dL — ABNORMAL HIGH (ref <117)

## 2012-11-21 LAB — TROPONIN I
Troponin I: <0.3 ng/mL (ref <0.30)
Troponin I: <0.3 ng/mL (ref <0.30)

## 2012-11-21 MED ORDER — RIVAROXABAN 10 MG PO TABS
20.0000 mg | ORAL_TABLET | Freq: Every day | ORAL | Status: DC
  Administered 2012-11-21: 20 mg via ORAL
  Filled 2012-11-21: qty 2

## 2012-11-21 MED ORDER — INSULIN GLARGINE 100 UNIT/ML ~~LOC~~ SOLN
100.0000 [IU] | Freq: Every day | SUBCUTANEOUS | Status: DC
  Administered 2012-11-21 – 2012-11-22 (×2): 100 [IU] via SUBCUTANEOUS
  Filled 2012-11-21 (×3): qty 1

## 2012-11-21 NOTE — Progress Notes (Addendum)
[facility] PROGRESS NOTE  Ralph [name] WUJ:[id number] DOB: 05-Jul-1950 DOA: 11/20/2012 PCP: [name], MD  Brief narrative 62 year old male patient with history of [employer] pacemaker in September 2013 for syncope with sinus arrest, chronic diastolic CHF, Lexiscan January 2014 showed no ischemia and EF 61%, OSA-intolerant of CPAP, tobacco abuse-quit a week ago, DM 2, HTN, recently evaluated by Dr. Alanda Amass on August 8 when pacemaker evaluation apparently showed atrial tachycardia and an episode of A. fib. He was admitted to the hospital on 11/21/12 with chest pain and palpitations. EKG on admission showed a flutter with variable conduction.  Assessment/Plan: 1. Chest pain: Likely secondary to tachycardia. MI ruled out by negative enzymes. No stress test in January 2014. [facility] cardiology consultation appreciated-no further workup at this point. Chest pain resolved. 2. Atrial flutter/fibrillation: [facility] cardiology consultation appreciated. Discussed with Dr. McDowell-recommend continue Cardizem CD, monitor on telemetry, followup repeat 2-D echo & discuss with his primary cardiologist regarding anticoagulation , antiarrhythmic drugs & EP consultation. Called Dr. Kandis Cocking office-he's out of office today but will be in the [place] office tomorrow and may see the patient in the hospital-discussed with his office nurse. Addendum: Discussed with Dr. Alanda Amass who recommended starting anticoagulation and outpatient followup with him regarding other issues. Started Xarelto: $41 per month co-pay-acceptable to patient. 3. COPD/tobacco abuse/OSA: Stable. Encouraged continued tobacco cessation. 4. Chronic diastolic CHF: Compensated 5. DM 2: Continue Lantus and SSI. 6. Hypertension: Controlled  Code Status: Full Family Communication: None Disposition Plan: Home when medically stable-possibly 8/14   Consultants:  [place] cardiology  Procedures:  None  Antibiotics:  None    HPI/Subjective: Head congestion. Denies dyspnea, palpitations or chest pain.  Objective: Filed Vitals:   11/21/12 0534 11/21/12 0706 11/21/12 1355 11/21/12 1415  BP:    122/71  Pulse:    80  Temp:    97.8 F (36.6 C)  TempSrc:    Oral  Resp:    22  Height:      Weight: 112.492 kg (248 lb)     SpO2:  93% 95% 95%    Intake/Output Summary (Last 24 hours) at 11/21/12 1603 Last data filed at 11/21/12 0200  Gross per 24 hour  Intake      0 ml  Output    450 ml  Net   -450 ml   Filed Weights   11/20/12 1412 11/20/12 1942 11/21/12 0534  Weight: 116.574 kg (257 lb) 117.935 kg (260 lb) 112.492 kg (248 lb)    Exam:   General exam: Comfortable. Obese. Lying comfortably in bed  Respiratory system: Clear. No increased work of breathing.  Cardiovascular system: S1 & S2 heard, RRR. No JVD, murmurs, gallops, clicks or pedal edema. Telemetry: Sinus rhythm.  Gastrointestinal system: Abdomen is nondistended, soft and nontender. Normal bowel sounds heard.  Central nervous system: Alert and oriented. No focal neurological deficits.  Extremities: Symmetric 5 x 5 power.   Data Reviewed: Basic Metabolic Panel:  Recent Labs Lab 11/16/12 1056 11/20/12 1430 11/21/12 0531  NA 137 134* 135  K 4.4 4.0 4.0  CL 100 96 97  CO2 27 23 26   GLUCOSE 142* 193* 100*  BUN 17 24* 20  CREATININE 0.84 0.92 0.79  CALCIUM 9.1 9.8 9.3  MG  --  1.8 2.0   Liver Function Tests:  Recent Labs Lab 11/16/12 1056 11/20/12 1430 11/21/12 0531  AST 21 28 27   ALT 37 44 43  ALKPHOS 302* 318* 292*  BILITOT 0.6 0.4 0.6  PROT 7.0  8.3 7.9  ALBUMIN 3.6 3.4* 3.1*   No results found for this basename: LIPASE, AMYLASE,  in the last 168 hours No results found for this basename: AMMONIA,  in the last 168 hours CBC:  Recent Labs Lab 11/20/12 1430 11/21/12 0531  WBC 13.9* 12.3*  HGB 14.8 13.4  HCT 43.7 42.9  MCV 96.5 97.5  PLT 400 358   Cardiac Enzymes:  Recent Labs Lab 11/20/12 1421  11/20/12 1755 11/20/12 2349 11/21/12 0531  TROPONINI <0.30 <0.30 <0.30 <0.30   BNP (last 3 results)  Recent Labs  11/20/12 1430  PROBNP 69.0   CBG:  Recent Labs Lab 11/20/12 2220 11/21/12 0734 11/21/12 1204  GLUCAP 86 101* 123*    No results found for this or any previous visit (from the past 240 hour(s)).   Studies: Dg Chest Portable 1 View  11/20/2012   *RADIOLOGY REPORT*  Clinical Data: Chest pain  PORTABLE CHEST - 1 VIEW  Comparison: 01/10/2012  Findings: The spinal stimulator and pacemaker are again noted and stable.  Cardiac shadow is within normal limits.  Vascular congestion is seen on top of chronic interstitial changes.  No focal infiltrate or sizable effusion is noted.  IMPRESSION: Mild vascular congestion.   Original Report Authenticated By: Alcide Clever, [name]     Additional labs:   Scheduled Meds: . albuterol  2.5 mg Nebulization Q6H  . aspirin EC  81 mg Oral Daily  . buPROPion  150 mg Oral BID  . diltiazem  360 mg Oral q morning - 10a  . docusate sodium  100 mg Oral BID  . fluticasone  1 spray Each Nare Daily  . furosemide  20 mg Oral q morning - 10a  . guaiFENesin  1,200 mg Oral BID  . insulin aspart  0-15 Units Subcutaneous TID WC  . insulin aspart  0-5 Units Subcutaneous QHS  . insulin glargine  100 Units Subcutaneous Daily  . ipratropium  0.5 mg Nebulization Q6H  . loratadine  10 mg Oral Daily  . metFORMIN  850 mg Oral BID WC  . pantoprazole  40 mg Oral BID  . potassium chloride  10 mEq Oral q morning - 10a  . rivaroxaban  20 mg Oral Q supper  . simvastatin  20 mg Oral QPM  . 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   If 8PM-8AM, please contact night-coverage at [email or web address], password [facility] 11/21/2012, 4:03 PM  LOS: 1 day

## 2012-11-21 NOTE — Progress Notes (Signed)
*  PRELIMINARY RESULTS* Echocardiogram 2D Echocardiogram has been performed.  Ralph [name] 11/21/2012, 12:01 PM

## 2012-11-21 NOTE — Consult Note (Signed)
Primary cardiologist: Dr. Susa [name]  Reason for consultation:: Chest pain, rapid heart beat Referring Physician: PTH  [name] [name].patient of Dr. Alanda [name] who underwent [employer] Jude pacemaker in September 2013 for syncope with sinus arrest. He also has a history of diastolic heart failure. He had a Lexiscan January 2014 showing no ischemia, ejection fraction 61%. He also has a history of obstructive sleep apnea, cigarette abuse - quit 8 days ago, diabetes mellitus, and hypertension.  Patient comes in with chest pain and rapid heartbeat. Cardiac enzymes are negative. Patient complains of 2-3 month history of rapid heartbeat usually occurring with any exertion but yesterday he occurred at rest. With this he complains of chest pain that is sometimes a pressure and sometimes like needles. He saw Dr. Alanda [name] for a pacemaker check last Friday. Pacer check shows 92 mode switches, atrial tachycardia short lived at 160-107 beats per minute, and one episode of atrial fibrillation for 11 minutes on July 19. He was in DDD mode and he was reprogrammed to autocapture because excellent chronic thresholds and RA threshold 0.65 at 0.4. Notes say Cardizem CD 240 mg daily was added to his medication, but the patient says he was not given any new medication.  Past Medical History  Diagnosis Date  . Hypertension   . Cirrhosis   . Depressed   . Varicose vein     of esophagus  . Difficult intubation     "trouble waking up afterwards" (01/06/2012)  . CHF (congestive heart failure) 01/06/2012    "found out I have this today"  . Sinus pause 01/06/2012    5.2 seconds  . Anginal pain   . Sleep apnea     "don't wear mask" (01/06/2012)  . Emphysema   . Type II diabetes mellitus   . GERD (gastroesophageal reflux disease)   . H/O hiatal hernia   . Coughing up blood     "comes from my throat" (01/06/2012)  . Arthritis     "back; fingers" (01/06/2012)  . Chronic lower back pain   . Fatty  liver disease, nonalcoholic   . CHB (complete heart block)   . Presence of permanent cardiac pacemaker 9/292013    St.Jude    Past Surgical History  Procedure Laterality Date  . Back surgery    . Spinal cord stimulator implant  2006  . Cholecystectomy  1993  . Nasal septum surgery  1992  . Tonsillectomy and adenoidectomy  1992  . Posterior fusion lumbar spine  1999    L4-5  . Lumbar disc surgery  1994; ~ 1995; ~ 1996  . Warthin's tumor excision  1990's    right  . Permanent pacemaker insertion  01/08/2012    CHB  . US echocardiography  12/28/2011    mild LVH,mild mitral annulara ca+,mild MR  . Nuclear stress test  10/19/2004    No ichemia    Family History  Problem Relation Age of Onset  . Diabetes Neg Hx     Social History:  reports that he has been smoking Cigarettes.  He has a 45 pack-year smoking history. He quit smokeless tobacco use 8 days ago. He reports that he does not drink alcohol or use illicit drugs.  Allergies:  Allergies  Allergen Reactions  . Nitroglycerin Hives, Swelling and Rash    Medications:  Scheduled Meds: . albuterol  2.5 mg Nebulization Q6H  . aspirin EC  81 mg Oral Daily  . buPROPion  150 mg Oral BID  .  diltiazem  360 mg Oral q morning - 10a  . docusate sodium  100 mg Oral BID  . enoxaparin (LOVENOX) injection  40 mg Subcutaneous Q24H  . fluticasone  1 spray Each Nare Daily  . furosemide  20 mg Oral q morning - 10a  . guaiFENesin  1,200 mg Oral BID  . insulin aspart  0-15 Units Subcutaneous TID WC  . insulin aspart  0-5 Units Subcutaneous QHS  . insulin glargine  100 Units Subcutaneous Daily  . ipratropium  0.5 mg Nebulization Q6H  . loratadine  10 mg Oral Daily  . metFORMIN  850 mg Oral BID WC  . pantoprazole  40 mg Oral BID  . potassium chloride  10 mEq Oral q morning - 10a  . simvastatin  20 mg Oral QPM  . sodium chloride  3 mL Intravenous Q12H   Continuous Infusions:  PRN Meds:.morphine injection, ondansetron (ZOFRAN) IV,  ondansetron, oxyCODONE  Results for orders placed during the hospital encounter of 11/20/12 (from the past 48 hour(s))  TROPONIN I     Status: None   Collection Time    11/20/12  2:21 PM      Result Value Range   Troponin I <0.30  <0.30 ng/mL   Comment:            Due to the release kinetics of cTnI,     a negative result within the first hours     of the onset of symptoms does not rule out     myocardial infarction with certainty.     If myocardial infarction is still suspected,     repeat the test at appropriate intervals.  PRO B NATRIURETIC PEPTIDE     Status: None   Collection Time    11/20/12  2:30 PM      Result Value Range   Pro B Natriuretic peptide (BNP) 69.0  0 - 125 pg/mL  CBC     Status: Abnormal   Collection Time    11/20/12  2:30 PM      Result Value Range   WBC 13.9 (*) 4.0 - 10.5 K/uL   RBC 4.53  4.22 - 5.81 MIL/uL   Hemoglobin 14.8  13.0 - 17.0 g/dL   HCT 40.9  81.1 - 91.4 %   MCV 96.5  78.0 - 100.0 fL   MCH 32.7  26.0 - 34.0 pg   MCHC 33.9  30.0 - 36.0 g/dL   RDW 78.2  95.6 - 21.3 %   Platelets 400  150 - 400 K/uL  COMPREHENSIVE METABOLIC PANEL     Status: Abnormal   Collection Time    11/20/12  2:30 PM      Result Value Range   Sodium 134 (*) 135 - 145 mEq/L   Potassium 4.0  3.5 - 5.1 mEq/L   Chloride 96  96 - 112 mEq/L   CO2 23  19 - 32 mEq/L   Glucose, Bld 193 (*) 70 - 99 mg/dL   BUN 24 (*) 6 - 23 mg/dL   Creatinine, Ser 0.86  0.50 - 1.35 mg/dL   Calcium 9.8  8.4 - 57.8 mg/dL   Total Protein 8.3  6.0 - 8.3 g/dL   Albumin 3.4 (*) 3.5 - 5.2 g/dL   AST 28  0 - 37 U/L   ALT 44  0 - 53 U/L   Alkaline Phosphatase 318 (*) 39 - 117 U/L   Total Bilirubin 0.4  0.3 - 1.2 mg/dL   GFR calc  non Af Amer >90  >90 mL/min   GFR calc Af Amer >90  >90 mL/min   Comment:            The eGFR has been calculated     using the CKD EPI equation.     This calculation has not been     validated in all clinical     situations.     eGFR's persistently     <90 mL/min  signify     possible Chronic Kidney Disease.  MAGNESIUM     Status: None   Collection Time    11/20/12  2:30 PM      Result Value Range   Magnesium 1.8  1.5 - 2.5 mg/dL  PROTIME-INR     Status: None   Collection Time    11/20/12  2:30 PM      Result Value Range   Prothrombin Time 13.0  11.6 - 15.2 seconds   INR 1.00  0.00 - 1.49  POCT I-STAT TROPONIN I     Status: None   Collection Time    11/20/12  2:39 PM      Result Value Range   Troponin i, poc 0.00  0.00 - 0.08 ng/mL   Comment 3            Comment: Due to the release kinetics of cTnI,     a negative result within the first hours     of the onset of symptoms does not rule out     myocardial infarction with certainty.     If myocardial infarction is still suspected,     repeat the test at appropriate intervals.  TROPONIN I     Status: None   Collection Time    11/20/12  5:55 PM      Result Value Range   Troponin I <0.30  <0.30 ng/mL   Comment:            Due to the release kinetics of cTnI,     a negative result within the first hours     of the onset of symptoms does not rule out     myocardial infarction with certainty.     If myocardial infarction is still suspected,     repeat the test at appropriate intervals.  GLUCOSE, CAPILLARY     Status: None   Collection Time    11/20/12 10:20 PM      Result Value Range   Glucose-Capillary 86  70 - 99 mg/dL  TROPONIN I     Status: None   Collection Time    11/20/12 11:49 PM      Result Value Range   Troponin I <0.30  <0.30 ng/mL   Comment:            Due to the release kinetics of cTnI,     a negative result within the first hours     of the onset of symptoms does not rule out     myocardial infarction with certainty.     If myocardial infarction is still suspected,     repeat the test at appropriate intervals.  URINALYSIS, ROUTINE W REFLEX MICROSCOPIC     Status: Abnormal   Collection Time    11/21/12  5:30 AM      Result Value Range   Color, Urine YELLOW   YELLOW   APPearance CLEAR  CLEAR   Specific Gravity, Urine 1.020  1.005 - 1.030   pH 5.5  5.0 -  8.0   Glucose, UA NEGATIVE  NEGATIVE mg/dL   Hgb urine dipstick TRACE (*) NEGATIVE   Bilirubin Urine NEGATIVE  NEGATIVE   Ketones, ur NEGATIVE  NEGATIVE mg/dL   Protein, ur NEGATIVE  NEGATIVE mg/dL   Urobilinogen, UA 0.2  0.0 - 1.0 mg/dL   Nitrite NEGATIVE  NEGATIVE   Leukocytes, UA NEGATIVE  NEGATIVE  URINE MICROSCOPIC-ADD ON     Status: Abnormal   Collection Time    11/21/12  5:30 AM      Result Value Range   Squamous Epithelial / LPF FEW (*) RARE   WBC, UA 0-2  <3 WBC/hpf   RBC / HPF 0-2  <3 RBC/hpf   Bacteria, UA RARE  RARE  CBC     Status: Abnormal   Collection Time    11/21/12  5:31 AM      Result Value Range   WBC 12.3 (*) 4.0 - 10.5 K/uL   RBC 4.40  4.22 - 5.81 MIL/uL   Hemoglobin 13.4  13.0 - 17.0 g/dL   HCT 91.4  78.2 - 95.6 %   MCV 97.5  78.0 - 100.0 fL   MCH 30.5  26.0 - 34.0 pg   MCHC 31.2  30.0 - 36.0 g/dL   RDW 21.3  08.6 - 57.8 %   Platelets 358  150 - 400 K/uL  MAGNESIUM     Status: None   Collection Time    11/21/12  5:31 AM      Result Value Range   Magnesium 2.0  1.5 - 2.5 mg/dL  COMPREHENSIVE METABOLIC PANEL     Status: Abnormal   Collection Time    11/21/12  5:31 AM      Result Value Range   Sodium 135  135 - 145 mEq/L   Potassium 4.0  3.5 - 5.1 mEq/L   Chloride 97  96 - 112 mEq/L   CO2 26  19 - 32 mEq/L   Glucose, Bld 100 (*) 70 - 99 mg/dL   BUN 20  6 - 23 mg/dL   Creatinine, Ser 4.69  0.50 - 1.35 mg/dL   Calcium 9.3  8.4 - 62.9 mg/dL   Total Protein 7.9  6.0 - 8.3 g/dL   Albumin 3.1 (*) 3.5 - 5.2 g/dL   AST 27  0 - 37 U/L   ALT 43  0 - 53 U/L   Alkaline Phosphatase 292 (*) 39 - 117 U/L   Total Bilirubin 0.6  0.3 - 1.2 mg/dL   GFR calc non Af Amer >90  >90 mL/min   GFR calc Af Amer >90  >90 mL/min   Comment:            The eGFR has been calculated     using the CKD EPI equation.     This calculation has not been     validated in all  clinical     situations.     eGFR's persistently     <90 mL/min signify     possible Chronic Kidney Disease.  TROPONIN I     Status: None   Collection Time    11/21/12  5:31 AM      Result Value Range   Troponin I <0.30  <0.30 ng/mL   Comment:            Due to the release kinetics of cTnI,     a negative result within the first hours     of the onset of symptoms does  not rule out     myocardial infarction with certainty.     If myocardial infarction is still suspected,     repeat the test at appropriate intervals.  GLUCOSE, CAPILLARY     Status: Abnormal   Collection Time    11/21/12  7:34 AM      Result Value Range   Glucose-Capillary 101 (*) 70 - 99 mg/dL   Comment 1 Notify RN      Dg Chest Portable 1 View  11/20/2012   *RADIOLOGY REPORT*  Clinical Data: Chest pain  PORTABLE CHEST - 1 VIEW  Comparison: 01/10/2012  Findings: The spinal stimulator and pacemaker are again noted and stable.  Cardiac shadow is within normal limits.  Vascular congestion is seen on top of chronic interstitial changes.  No focal infiltrate or sizable effusion is noted.  IMPRESSION: Mild vascular congestion.   Original Report Authenticated By: Alcide Clever, [name]    ROS See HPI Eyes: Negative Ears:Negative for hearing loss, tinnitus Cardiovascular: Negative for chest pain, palpitations,irregular heartbeat, dyspnea, dyspnea on exertion, near-syncope, orthopnea, paroxysmal nocturnal dyspnea and syncope,edema, claudication, cyanosis,.  Respiratory:   chroinic cough and dyspnea on exertion,no hemoptysis. Does have sleep apnea, not using cpap.   Endocrine: Negative for cold intolerance and heat intolerance.  Hematologic/Lymphatic: Negative for adenopathy and bleeding problem. Does not bruise/bleed easily.  Musculoskeletal: Negative.   Gastrointestinal: Negative for nausea, vomiting, reflux, abdominal pain, diarrhea, constipation.   Neurological: Negative.  Allergic/Immunologic: Negative for environmental  allergies.  Blood pressure 123/72, pulse 71, temperature 98.3 F (36.8 C), temperature source Oral, resp. rate 22, height 5\' 10"  (1.778 m), weight 248 lb (112.492 kg), SpO2 93.00%. Physical Exam PHYSICAL EXAM: Well-nournished, in no acute distress. Neck: No JVD, HJR, Bruit, or thyroid enlargement Lungs: Decreased breath sounds throughout. Cardiovascular: RRR, PMI not displaced, heart sounds distant, no murmurs, gallops, bruit, thrill, or heave. Abdomen: BS normal. Soft without organomegaly, masses, lesions or tenderness. Extremities: without cyanosis, clubbing or edema. Good distal pulses bilateral SKin: Warm, no lesions or rashes  Musculoskeletal: No deformities Neuro: no focal signs   EKG: Atrial flutter at 139 beats per minute with nonspecific ST-T wave changes  2Decho pending  Assessment/Plan:  1 Chest pain: MI ruled out with negative enzymes, normal stress test in January. Chest pain most likely related to rapid heart rate. No further work up at this point.  2 Atrial fibrillation/flutter: EKG admission shows atrial flutter. Has had 2 episodes of atrial fibrillation since July. Patient now on Cardizem 360 mg daily CHADS2-3. Would consider anticoagulation, but allow Dr. Alanda [name] to decide since he follows him regularly.  3 Status post St. Jude pacemaker in 9/13 for sinus arrest and syncope  4 Normal stress Myoview in January 2014 ejection fraction 61%  5 History of diastolic heart failure  6 Diabetes mellitus  7 Hypertension  8 Smoker-quit 8 days ago.  Jacolyn Reedy 11/21/2012, 8:24 AM    Attending note:  Patient seen and examined. Discussed case with Ms. Geni Bers PA-C and modified above note. Records from Dr. Alanda [name] were reviewed. Ralph [name] with at least 2 month history of progressive palpitations, more recently prolonged symptoms associated with chest pain. He was recently evaluated by Dr. Alanda [name] on August 8, had pacemaker interrogation done at that time  demonstrating multiple mode switches and evidence of what was described as an atrial tachycardia, also one episode of atrial fibrillation. Adjustments were made, plan to initiate Cardizem CD at that time as well, although patient does not seem  to recall being started on any new medications. At presentation his ECG was consistent with atrial flutter with variable conduction. He is in sinus rhythm today having been started on Cardizem CD 360 mg daily, cardiac markers argue against ACS, no further chest pain. Echocardiogram has been ordered and is pending. CHADS2 score is 3.  We will review the echocardiogram to ensure stability in LV function, rule out any component of tachycardia-mediated cardiomyopathy. Agree with continuing Cardizem CD for now. Ralph [name] close followup with Dr. Alanda [name] to discuss potential for anticoagulation in light of his atrial fibrillation and atrial flutter that has been documented, also the possibility of whether an antiarrhythmic may [name] to be considered given frequency of his arrhythmias. May even ultimately [name] EP consultation to discuss ablation if in fact atrial flutter is his predominant arrhythmia. Would recommend that the hospitalist team contact Dr. Alanda [name] to ensure that he is aware of the patient's hospitalization and recent findings.  Jonelle Sidle, [name], F.A.C.C.

## 2012-11-21 NOTE — Progress Notes (Signed)
UR chart review completed.  

## 2012-11-21 NOTE — Progress Notes (Signed)
ANTICOAGULATION CONSULT NOTE - Initial Consult  Pharmacy Consult for Xarelto Indication: atrial fibrillation  Allergies  Allergen Reactions  . Nitroglycerin Hives, Swelling and Rash    Patient Measurements: Height: 5\' 10"  (177.8 cm) Weight: 248 lb (112.492 kg) IBW/kg (Calculated) : 73  Vital Signs: Temp: 98.3 F (36.8 C) (08/13 0522) Temp src: Oral (08/13 0522) BP: 123/72 mmHg (08/13 0522) Pulse Rate: 71 (08/13 0522)  Labs:  Recent Labs  11/20/12 1430 11/20/12 1755 11/20/12 2349 11/21/12 0531  HGB 14.8  --   --  13.4  HCT 43.7  --   --  42.9  PLT 400  --   --  358  LABPROT 13.0  --   --   --   INR 1.00  --   --   --   CREATININE 0.92  --   --  0.79  TROPONINI  --  <0.30 <0.30 <0.30    Estimated Creatinine Clearance: 124.9 ml/min (by C-G formula based on Cr of 0.79).   Medical History: Past Medical History  Diagnosis Date  . Hypertension   . Cirrhosis   . Depressed   . Varicose vein     of esophagus  . Difficult intubation     "trouble waking up afterwards" (01/06/2012)  . CHF (congestive heart failure) 01/06/2012    "found out I have this today"  . Sinus pause 01/06/2012    5.2 seconds  . Anginal pain   . Sleep apnea     "don't wear mask" (01/06/2012)  . Emphysema   . Type II diabetes mellitus   . GERD (gastroesophageal reflux disease)   . H/O hiatal hernia   . Coughing up blood     "comes from my throat" (01/06/2012)  . Arthritis     "back; fingers" (01/06/2012)  . Chronic lower back pain   . Fatty liver disease, nonalcoholic   . CHB (complete heart block)   . Presence of permanent cardiac pacemaker 9/292013    St.Jude    Medications:  Scheduled:  . albuterol  2.5 mg Nebulization Q6H  . aspirin EC  81 mg Oral Daily  . buPROPion  150 mg Oral BID  . diltiazem  360 mg Oral q morning - 10a  . docusate sodium  100 mg Oral BID  . fluticasone  1 spray Each Nare Daily  . furosemide  20 mg Oral q morning - 10a  . guaiFENesin  1,200 mg Oral BID  .  insulin aspart  0-15 Units Subcutaneous TID WC  . insulin aspart  0-5 Units Subcutaneous QHS  . insulin glargine  100 Units Subcutaneous Daily  . ipratropium  0.5 mg Nebulization Q6H  . loratadine  10 mg Oral Daily  . metFORMIN  850 mg Oral BID WC  . pantoprazole  40 mg Oral BID  . potassium chloride  10 mEq Oral q morning - 10a  . rivaroxaban  20 mg Oral Q supper  . simvastatin  20 mg Oral QPM  . sodium chloride  3 mL Intravenous Q12H    Assessment: 62 yo M admitted with Afib.  CHADS2 = 3.  No bleeding noted.  Excellent renal function.  Goal of Therapy:  Stroke prevention   Plan:  Xarelto 20mg  po daily with FOOD Monitor for s/sx of bleeding Initiate patient education  Ralph [name] 11/21/2012,3:00 PM

## 2012-11-21 NOTE — Care Management Note (Signed)
    Page 1 of 2   11/22/2012     1:33:49 PM   CARE MANAGEMENT NOTE 11/22/2012  Patient:  Ralph [name], Ralph [name]   Account Number:  [id number]  Date Initiated:  11/21/2012  Documentation initiated by:  Sharrie Rothman  Subjective/Objective Assessment:   Pt admitted from home with tachycardia. Pt lives with his wife and will return home at discharge. Pt is fairly independent with ADL's.     Action/Plan:   No CM needs noted.   Anticipated DC Date:  11/22/2012   Anticipated DC Plan:  HOME/SELF CARE      DC Planning Services  CM consult      PAC Choice  DURABLE MEDICAL EQUIPMENT   Choice offered to / List presented to:  C-1 Patient   DME arranged  [facility]      DME agency  [facility]        Status of service:  Completed, signed off Medicare Important Message given?  NA - LOS <3 / Initial given by admissions (If response is "NO", the following Medicare IM given date fields will be blank) Date Medicare IM given:   Date Additional Medicare IM given:    Discharge Disposition:  HOME/SELF CARE  Per UR Regulation:    If discussed at Long Length of Stay Meetings, dates discussed:    Comments:  11/22/12 1330 Arlyss Queen, RN BSN CM Pt discharged home today with home O2 from [facility]. Orders sent to [facility] and pt has portable O2 to go home with and Christoper Allegra will deliver concentrator to pts home. Pt is aware of copay for Xarelto. No other CM needs noted.  11/21/12 1445 Arlyss Queen, RN BSN CM Pt will have $41 copay for Xarelto. MD notified.  11/21/12 1127 Arlyss Queen, RN BSN CM

## 2012-11-22 MED ORDER — FLUTICASONE PROPIONATE 50 MCG/ACT NA SUSP: 1.0000 | Freq: Every day | NASAL | Status: DC

## 2012-11-22 MED ORDER — LORATADINE 10 MG PO TABS: 10.0000 mg | ORAL_TABLET | Freq: Every day | ORAL | Status: DC

## 2012-11-22 MED ORDER — RIVAROXABAN 20 MG PO TABS: 20.0000 mg | ORAL_TABLET | Freq: Every day | ORAL | Status: DC

## 2012-11-22 NOTE — Progress Notes (Signed)
Patient oxygen saturation while ambulating without oxygen is 78% and patient sitting without oxygen is 88%.

## 2012-11-22 NOTE — Progress Notes (Addendum)
Patient oxygen saturation at rest without oxygen is 88%. Patient oxygen saturation while ambulating without oxygen is 78%. Patient oxygen saturation while ambulating with 2 liters oxygen on is 94%.

## 2012-11-22 NOTE — Progress Notes (Signed)
Patient received discharge instructions along with follow up appointments and prescriptions. Patient verbalized understanding of all instructions. Patient was escorted by staff via wheelchair to vehicle. Patient discharged to home in stable condition. 

## 2012-11-22 NOTE — Progress Notes (Signed)
Patient oxygen saturation was 88% on room air.

## 2012-11-22 NOTE — Progress Notes (Signed)
   Primary cardiologist: Dr. Susa Griffins  SUBJECTIVE: Feels good but complains of chronic back pain. Wants to go home.    LABS: Basic Metabolic Panel:  Recent Labs  16/10/96 1430 11/21/12 0531  NA 134* 135  K 4.0 4.0  CL 96 97  CO2 23 26  GLUCOSE 193* 100*  BUN 24* 20  CREATININE 0.92 0.79  CALCIUM 9.8 9.3  MG 1.8 2.0   Liver Function Tests:  Recent Labs  11/20/12 1430 11/21/12 0531  AST 28 27  ALT 44 43  ALKPHOS 318* 292*  BILITOT 0.4 0.6  PROT 8.3 7.9  ALBUMIN 3.4* 3.1*   CBC:  Recent Labs  11/20/12 1430 11/21/12 0531  WBC 13.9* 12.3*  HGB 14.8 13.4  HCT 43.7 42.9  MCV 96.5 97.5  PLT 400 358   Cardiac Enzymes:  Recent Labs  11/20/12 1755 11/20/12 2349 11/21/12 0531  TROPONINI <0.30 <0.30 <0.30    Recent Labs  11/20/12 2125  TSH 3.099    RADIOLOGY: Dg Chest Portable 1 View  11/20/2012   *RADIOLOGY REPORT*  Clinical Data: Chest pain  PORTABLE CHEST - 1 VIEW  Comparison: 01/10/2012  Findings: The spinal stimulator and pacemaker are again noted and stable.  Cardiac shadow is within normal limits.  Vascular congestion is seen on top of chronic interstitial changes.  No focal infiltrate or sizable effusion is noted.  IMPRESSION: Mild vascular congestion.   Original Report Authenticated By: Alcide Clever, [name]    PHYSICAL EXAM BP 123/49  Pulse 69  Temp(Src) 98.1 F (36.7 C) (Oral)  Resp 16  Ht 5\' 10"  (1.778 m)  Wt 248 lb (112.492 kg)  BMI 35.58 kg/m2  SpO2 93%  General: No acute distress. Lungs: Clear bilaterally to auscultation and percussion. Heart: HRRR S1 S2, No MRG. Abdomen: Bowel sounds are positive, abdomen obese, soft and non-tender. Extremities: No clubbing, cyanosis or edema.  DP +1  TELEMETRY: Reviewed telemetry pt in: SR rate of 79 bpm.  ASSESSMENT AND PLAN:  1. Atrial flutter as well as atrial fibrillation: He is now on Cardizem CD 360 mg daily and was started on Xarelto 20 mg daily by the primary team. He is feeling  better and wants to go home. . F/u appt with Dr. Alanda Amass has been made for next week.   2. Chest Pain: Resolved. No clear evidence of ACS.  3. Hypertension: Excellent control of BP. Continue current medication regimen. Follow up with PCP and cardiologist.  Bettey Mare. Lyman Bishop NP [facility] 11/22/2012, 9:23 AM   Attending note:  Please see my note from yesterday. Patient currently on Cardizem CD 360 mg daily, was also started on Xarelto by the primary team. Dr. Waymon Amato tells me that he was able to speak with Dr. Kandis Cocking office yesterday and informed them of the patient's presence in the hospital. I am told that Dr. Alanda Amass will be seeing the patient later today, and can assume management and care going forward.  Jonelle Sidle, [name], F.A.C.C.

## 2012-11-22 NOTE — Discharge Summary (Signed)
Physician Discharge Summary  Ralph [name]:[id number] DOB: Oct 16, 1950 DOA: 11/20/2012  PCP: [name], MD  Admit date: 11/20/2012 Discharge date: 11/22/2012  Time spent: Greater than 30 minutes  Recommendations for Outpatient Follow-up:  1. Dr. Susa Griffins, Cardiology on 11/30/12 at 11 AM in the [facility]. 2. Dr. Karleen Hampshire, PCP in 1 week. 3. Oxygen via nasal cannula at 2 L per minute continuously.  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 Alanda Amass on August 8 when pacemaker evaluation apparently showed atrial tachycardia and an episode of A. fib. He was admitted to the hospital on 11/21/12 with chest pain and palpitations. EKG on admission showed a flutter with variable conduction.  Hospital Course:  1. Chest pain: Likely secondary to tachycardia. MI ruled out by negative enzymes. Neg stress test in January 2014. [facility] cardiology consultation appreciated-no further workup at this point. Chest pain resolved. 2. Atrial flutter/fibrillation: [place] cardiology consulted and recommended continued Cardizem CD, anticoagulation, outpatient followup with primary cardiologist  regarding starting antiarrhythmic medications and possible EP consultation. He complained of 2-3 month history of rapid heartbeat usually occurring with any exertion but on day of admission occurred with rest. Per cardiology, Pacer check recently showed 92 mode switches, atrial tachycardia short-lived at 160-107 beats per minute and one episode of atrial fibrillation for 11 minutes on July 19. Discussed with Dr. Alanda Amass on 8/13 who recommended starting anticoagulation and outpatient followup with him regarding other issues. Started Xarelto: $41 per month co-pay-acceptable to patient. Patient has followup appointment with primary cardiologist. Remains in sinus rhythm. Repeat echo with normal EF. 3. COPD/tobacco abuse/OSA: Stable. Encouraged continued tobacco cessation. Patient complains of stuffy nose and hence couldn't use CPAP and returned it. Advised him that he may need to have it refit and start using it-can followup with PCP. Started Claritin and Flonase in the hospital. 4. Chronic diastolic CHF: Compensated 5. DM 2: Continue Lantus and SSI. A1c 6.5 suggests good control. 6. Hypertension: Controlled 7. Chronic hypoxic respiratory failure: Likely secondary to COPD and OSA. Arranged for home oxygen.   Procedures:  None   Consultations:  Cardiology  Discharge Exam:  Complaints: No complaints and anxious to go home. No chest pain or palpitations. No dyspnea  Filed Vitals:   11/22/12 0132 11/22/12 0251 11/22/12 0500 11/22/12 0718  BP:  146/74 123/49   Pulse:  74 69   Temp:  98.5 F (36.9 C) 98.1 F (36.7 C)   TempSrc:  Oral Oral   Resp:  17 16   Height:      Weight:      SpO2: 91% 97% 97% 93%     General exam:  Comfortable. Obese. Lying comfortably in bed   Respiratory system: Clear. No increased work of breathing.   Cardiovascular system: S1 & S2 heard, RRR. No JVD, murmurs, gallops, clicks or pedal edema. Telemetry: Sinus rhythm.   Gastrointestinal system: Abdomen is  nondistended, soft and nontender. Normal bowel sounds heard.   Central nervous system: Alert and oriented. No focal neurological deficits.   Extremities: Symmetric 5 x 5 power   Discharge Instructions      Discharge Orders   Future Orders Complete By Expires   (HEART FAILURE PATIENTS) Call MD:  Anytime you have any of the following symptoms: 1) 3 pound weight gain in 24 hours or 5 pounds in 1 week 2) shortness of breath, with or without a dry hacking cough 3) swelling in the hands, feet or stomach 4) if you have to sleep on extra pillows at night in order to breathe.  As directed    Call MD for:  difficulty breathing, headache or visual disturbances  As directed    Call MD for:  extreme fatigue  As directed    Call MD for:  persistant dizziness or light-headedness  As directed    Call MD for:  severe uncontrolled pain  As directed    Call MD for:  As directed    Comments:     Palpitations/heart racing.   Diet - low sodium heart healthy  As directed    Diet Carb Modified  As directed    Discharge instructions  As directed    Comments:     1. Oxygen via nasal cannula at 2 L per minute continuously.   Increase activity slowly  As directed        Medication List         albuterol 108 (90 BASE) MCG/ACT inhaler  Commonly known as:  PROVENTIL HFA;VENTOLIN HFA  Inhale 2 puffs into the lungs every 6 (six) hours as needed for wheezing.     aspirin EC 81 MG tablet  Take 81 mg by mouth daily.     buPROPion 150 MG 12 hr tablet  Commonly known as:  WELLBUTRIN SR  Take 150 mg by mouth 2 (two) times daily.     diltiazem 360 MG 24 hr capsule  Commonly known as:  CARDIZEM CD  Take 360 mg by mouth every morning.     fluticasone 50 MCG/ACT nasal spray  Commonly known as:  FLONASE  Place 1 spray into the nose daily.     furosemide 20 MG tablet  Commonly known as:  LASIX  Take 20 mg by mouth every morning.     insulin glargine 100 units/mL Soln  Commonly known as:  LANTUS  Inject  100-105 Units into the skin daily at 10 pm. Pt uses 100 units nightly & gives additional 5units if blood sugar elevated >130.     loratadine 10 MG tablet  Commonly known as:  CLARITIN  Take 1 tablet (10 mg total) by mouth daily.     metFORMIN 850 MG tablet  Commonly known as:  GLUCOPHAGE  Take 850 mg by mouth 2 (two) times daily.     pantoprazole 40 MG tablet  Commonly known as:  PROTONIX  Take 40 mg by mouth every morning.     potassium chloride 10 MEQ tablet  Commonly known as:  K-DUR  Take 10 mEq by mouth every morning.     Rivaroxaban 20 MG Tabs tablet  Commonly known as:  XARELTO  Take 1 tablet (20 mg total)  by mouth daily with supper.     simvastatin 20 MG tablet  Commonly known as:  ZOCOR  Take 20 mg by mouth every evening.     tiotropium 18 MCG inhalation capsule  Commonly known as:  SPIRIVA  Place 18 mcg into inhaler and inhale every morning.       Follow-up Information   Follow up with Governor Rooks, MD On 11/30/2012. [facility] at 11an)    Specialty:  Cardiology   Contact information:   [address] [place] [address] [phone]       Follow up with [name], MD. Schedule an appointment as soon as possible for a visit in 1 week.   Specialty:  Family Medicine   Contact information:   [address] [place] [address] [phone]        The results of significant diagnostics from this hospitalization (including imaging, microbiology, ancillary and laboratory) are listed below for reference.    Significant Diagnostic Studies: Dg Chest Portable 1 View  11/20/2012   *RADIOLOGY REPORT*  Clinical Data: Chest pain  PORTABLE CHEST - 1 VIEW  Comparison: 01/10/2012  Findings: The spinal stimulator and pacemaker are again noted and stable.  Cardiac shadow is within normal limits.  Vascular congestion is seen on top of chronic interstitial changes.  No focal infiltrate or sizable effusion is  noted.  IMPRESSION: Mild vascular congestion.   Original Report Authenticated By: Alcide Clever, [name]   2-D echo 11/21/12  Study Conclusions  - Left ventricle: The cavity size was normal. Wall thickness was increased in a pattern of mild LVH. There was moderate asymmetric hypertrophy of the septum. Systolic function was normal. The estimated ejection fraction was in the range of 60% to 65%. Wall motion was normal; there were no regional wall motion abnormalities. The study is not technically sufficient to allow evaluation of LV diastolic function. - Mitral valve: Trivial regurgitation. - Left atrium: The atrium was mildly dilated. - Right ventricle: The cavity size was mildly to moderately dilated. Pacer wire or catheter noted in right ventricle. Systolic function was normal. - Right atrium: Central venous pressure: 3mm Hg (est). - Tricuspid valve: Trivial regurgitation. - Pulmonary arteries: PA peak pressure: 25mm Hg (S). - Pericardium, extracardiac: There was no pericardial effusion.  Impressions:  - No prior study available for comparison. Mild LVH with moderate septal hypertrophy, LVEF 60-65%. Indeterminate diastolic function. Mild left atrial enlargement. Mild to moderate RV enlargement, device wire noted. Normal PASP and CVP. No pericardial effusion.   Microbiology: No results found for this or any previous visit (from the past 240 hour(s)).   Labs: Basic Metabolic Panel:  Recent Labs Lab 11/16/12 1056 11/20/12 1430 11/21/12 0531  NA 137 134* 135  K 4.4 4.0 4.0  CL 100 96 97  CO2 27 23 26   GLUCOSE 142* 193* 100*  BUN 17 24* 20  CREATININE 0.84 0.92 0.79  CALCIUM 9.1 9.8 9.3  MG  --  1.8 2.0   Liver Function Tests:  Recent Labs Lab 11/16/12 1056 11/20/12 1430 11/21/12 0531  AST 21 28 27   ALT 37 44 43  ALKPHOS 302* 318* 292*  BILITOT 0.6 0.4 0.6  PROT 7.0 8.3 7.9  ALBUMIN 3.6 3.4* 3.1*   No results found for this basename: LIPASE, AMYLASE,  in the last 168  hours No results found for this basename: AMMONIA,  in the last 168 hours CBC:  Recent Labs Lab 11/20/12 1430  11/21/12 0531  WBC 13.9* 12.3*  HGB 14.8 13.4  HCT 43.7 42.9  MCV 96.5 97.5  PLT 400 358   Cardiac Enzymes:  Recent Labs Lab 11/20/12 1421 11/20/12 1755 11/20/12 2349 11/21/12 0531  TROPONINI <0.30 <0.30 <0.30 <0.30   BNP: BNP (last 3 results)  Recent Labs  11/20/12 1430  PROBNP 69.0   CBG:  Recent Labs Lab 11/21/12 1204 11/21/12 1710 11/21/12 2053 11/22/12 0720 11/22/12 1139  GLUCAP 123* 77 135* 92 90    Additional labs:  Hemoglobin A1c: 6.5  TSH: 3.099   Signed:  Laura Radilla  [facility] 11/22/2012, 12:31 PM

## 2012-11-30 LAB — PACEMAKER DEVICE OBSERVATION

## 2012-12-03 ENCOUNTER — Telehealth: Payer: Self-pay | Admitting: Cardiovascular Disease

## 2012-12-03 NOTE — Telephone Encounter (Signed)
Started on new medicine on Friday-having all kinds of side effects.

## 2012-12-03 NOTE — Telephone Encounter (Signed)
Returned call.  Pt stated he saw Dr. Alanda Amass on Friday and he put him on Multaq 400 mg.  Pt c/o uncontrollable shakes, dry cough, feet swelling and int fast heart beats.  Pt audibly SOB and having a difficult time talking.  Denied this is a new symptom.  Stated he is on O2 for COPD, but stated he doesn't usually have trouble breathing.  Pt seen in [place] office on Friday.  Informed JC, LPN/Dr. Alanda Amass will be notified as they are in the [place] office today.  Pt verbalized understanding and agreed w/ plan.  Message forwarded to [facility]. Berlinda Last, LPN.  Call to [facility] and informed.  Will discuss w/ Dr. Alanda Amass.

## 2012-12-04 ENCOUNTER — Telehealth: Payer: Self-pay | Admitting: Cardiovascular Disease

## 2012-12-04 NOTE — Telephone Encounter (Signed)
JC, LPN was notified and will f/u with pt.

## 2012-12-04 NOTE — Telephone Encounter (Signed)
Patient in hospital last week; started on Multaq Friday.  Unable to tolerate medication; entire body shaking, has had nausea and vomiting. Needs something else for Afib.

## 2012-12-07 NOTE — Telephone Encounter (Signed)
Dr. Alanda Amass informed, no new instructions  No new orders recieved

## 2012-12-07 NOTE — Telephone Encounter (Signed)
Pt called and informed to call back and let me know if he was having problems

## 2013-01-01 ENCOUNTER — Ambulatory Visit: Payer: Medicare Other | Admitting: Cardiology

## 2013-01-03 ENCOUNTER — Encounter: Payer: Self-pay | Admitting: Cardiology

## 2013-01-03 ENCOUNTER — Ambulatory Visit (INDEPENDENT_AMBULATORY_CARE_PROVIDER_SITE_OTHER): Payer: Medicare Other | Admitting: Cardiology

## 2013-01-03 ENCOUNTER — Encounter: Payer: Self-pay | Admitting: *Deleted

## 2013-01-03 VITALS — BP 122/79 | HR 90 | Ht 70.0 in | Wt 242.0 lb

## 2013-01-03 DIAGNOSIS — I10 Essential (primary) hypertension: Secondary | ICD-10-CM

## 2013-01-03 DIAGNOSIS — I4891 Unspecified atrial fibrillation: Secondary | ICD-10-CM

## 2013-01-03 MED ORDER — METOPROLOL TARTRATE 25 MG PO TABS: 25.0000 mg | ORAL_TABLET | Freq: Two times a day (BID) | ORAL | Status: DC

## 2013-01-03 NOTE — Patient Instructions (Addendum)
Your physician recommends that you schedule a follow-up appointment in: 1 month with Dr. Wyline Mood.  Your physician has recommended you make the following change in your medication:  Start: Metoprolol 25 MG twice daily. A prescription for this has been sent to your pharmacy.  Continue all other medications the same.

## 2013-01-03 NOTE — Progress Notes (Signed)
Clinical Summary Mr. Hidalgo is a 62 y.o.male  1. History of sinus arrest - St Jude dual chamber pacemaker pacemaker implanted Sept 2013 [facility] [employer] RF).  - normal pacemaking function per [facility] Notes 11/2012 - no episodes of lightheadedness, or dizziness  2. OSA - not compliant w/ CPAP since recent sinus infection.    3. Afib - on diltiazem, started on multaq 400mg  bid during last visit to [facility] 11/30/12 - started multaq, reports felt very shaky and nauseous on it w/ increased SOB. Was on for just 2-3 days and then stopped on his own w/ resolution of symptoms. - reports palps after exertion, doesn't occur at rest. Assoc w/ SOB, occas lightheadedness. Has had some admissions w/ afib w/ [facility] over the last few months   4. HTN - compliant w/ meds - checks bp at home once daily, typically 120s/80s  5. HL - on simvastatin, reports PCP has noted recent increase in LFTs. Labs in 11/2012 show normal AST and ALT, mild increase alk phos. Referred to GI.    Past Medical History  Diagnosis Date  . Hypertension   . Cirrhosis   . Depressed   . Varicose vein     of esophagus  . Difficult intubation     "trouble waking up afterwards" (01/06/2012)  . CHF (congestive heart failure) 01/06/2012    "found out I have this today"  . Sinus pause 01/06/2012    5.2 seconds  . Anginal pain   . Sleep apnea     "don't wear mask" (01/06/2012)  . Emphysema   . Type II diabetes mellitus   . GERD (gastroesophageal reflux disease)   . H/O hiatal hernia   . Coughing up blood     "comes from my throat" (01/06/2012)  . Arthritis     "back; fingers" (01/06/2012)  . Chronic lower back pain   . Fatty liver disease, nonalcoholic   . CHB (complete heart block)   . Presence of permanent cardiac pacemaker 9/292013    St.Jude  LBBB   Allergies  Allergen Reactions  . Nitroglycerin Hives, Swelling and Rash     Current Outpatient Prescriptions  Medication Sig Dispense Refill  .  albuterol (PROVENTIL HFA;VENTOLIN HFA) 108 (90 BASE) MCG/ACT inhaler Inhale 2 puffs into the lungs every 6 (six) hours as needed for wheezing.      Marland Kitchen aspirin EC 81 MG tablet Take 81 mg by mouth daily.      Marland Kitchen buPROPion (WELLBUTRIN SR) 150 MG 12 hr tablet Take 150 mg by mouth 2 (two) times daily.      Marland Kitchen diltiazem (CARDIZEM CD) 360 MG 24 hr capsule Take 360 mg by mouth every morning.      . fluticasone (FLONASE) 50 MCG/ACT nasal spray Place 1 spray into the nose daily.  16 g  0  . furosemide (LASIX) 20 MG tablet Take 20 mg by mouth every morning.      . insulin glargine (LANTUS) 100 units/mL SOLN Inject 100-105 Units into the skin daily at 10 pm. Pt uses 100 units nightly & gives additional 5units if blood sugar elevated >130.      Marland Kitchen loratadine (CLARITIN) 10 MG tablet Take 1 tablet (10 mg total) by mouth daily.  15 tablet  0  . metFORMIN (GLUCOPHAGE) 850 MG tablet Take 850 mg by mouth 2 (two) times daily.      . pantoprazole (PROTONIX) 40 MG tablet Take 40 mg by mouth every morning.      Marland Kitchen  potassium chloride (K-DUR) 10 MEQ tablet Take 10 mEq by mouth every morning.      . rivaroxaban (XARELTO) 20 MG TABS tablet Take 1 tablet (20 mg total) by mouth daily with supper.  30 tablet  0  . simvastatin (ZOCOR) 20 MG tablet Take 20 mg by mouth every evening.      . tiotropium (SPIRIVA) 18 MCG inhalation capsule Place 18 mcg into inhaler and inhale every morning.        No current facility-administered medications for this visit.     Past Surgical History  Procedure Laterality Date  . Back surgery    . Spinal cord stimulator implant  2006  . Cholecystectomy  1993  . Nasal septum surgery  1992  . Tonsillectomy and adenoidectomy  1992  . Posterior fusion lumbar spine  1999    L4-5  . Lumbar disc surgery  1994; ~ 1995; ~ 1996  . Warthin's tumor excision  1990's    right  . Permanent pacemaker insertion  01/08/2012    CHB  . US echocardiography  12/28/2011    mild LVH,mild mitral annulara ca+,mild MR    . Nuclear stress test  10/19/2004    No ichemia     Allergies  Allergen Reactions  . Nitroglycerin Hives, Swelling and Rash      Family History  Problem Relation Age of Onset  . Diabetes Neg Hx      Social History Mr. Wicklund reports that he has been smoking Cigarettes.  He has a 45 pack-year smoking history. He quit smokeless tobacco use about 7 weeks ago. Mr. Mazor reports that he does not drink alcohol.   Review of Systems 12 point ROS negative other than reported in HPI  Physical Examination There were no vitals filed for this visit. There were no vitals filed for this visit.  Gen: resting comfortably, NAD HEENT: no scleral icterus, pupils equal round and reactive, no palptable cervical adenopathy CV: RRR, no m/r/g, no JVD, no carotid bruits Pulm: CTAB Abd: soft, NT, ND NABS, no hepatosplenomegaly Ext: warm, no edema.  Skin: warm, no rash Neuro: A&Ox3, no focal deficits    Diagnostic Studies Jan 2014 Myoview: no ischemia  11/2012 Echo: LVEF 60-65%, mild LVH, moderate basal septal hypertrophy, mild LAE   Assessment and Plan  1. Afib - still having some symptoms on current therapy. Seemed to have signigicant side effects w/ dronederone. - will start low dose beta blocker, patient has COPD however this is not an absolute contraindication, there is actually quite a bit of data that shows COPD patients do well on beta blockers. Counseled if worsening SOB to stop metoprolol - if not tolerant of beta blocker, will refer to EP for assistance in possible alternative antiarrythmic  2. HTN:  - bp at goal continue current meds  3. HL - followed by PCP. Reports PCP is watching his LFTs closely due to an increase, will defer management to PCP   Antoine Poche, [name], F.A.C.C.

## 2013-01-04 ENCOUNTER — Encounter: Payer: Self-pay | Admitting: Cardiology

## 2013-01-07 ENCOUNTER — Encounter: Payer: Self-pay | Admitting: Cardiology

## 2013-01-07 ENCOUNTER — Ambulatory Visit: Payer: Medicare Other | Admitting: Cardiology

## 2013-01-21 ENCOUNTER — Ambulatory Visit (INDEPENDENT_AMBULATORY_CARE_PROVIDER_SITE_OTHER): Payer: Medicare Other | Admitting: Gastroenterology

## 2013-01-21 ENCOUNTER — Encounter: Payer: Self-pay | Admitting: Gastroenterology

## 2013-01-21 VITALS — BP 117/63 | HR 63 | Temp 97.6°F | Ht 70.0 in | Wt 249.4 lb

## 2013-01-21 DIAGNOSIS — K7460 Unspecified cirrhosis of liver: Secondary | ICD-10-CM

## 2013-01-21 DIAGNOSIS — D649 Anemia, unspecified: Secondary | ICD-10-CM

## 2013-01-21 DIAGNOSIS — K3189 Other diseases of stomach and duodenum: Secondary | ICD-10-CM

## 2013-01-21 DIAGNOSIS — R1319 Other dysphagia: Secondary | ICD-10-CM

## 2013-01-21 DIAGNOSIS — R1013 Epigastric pain: Secondary | ICD-10-CM

## 2013-01-21 MED ORDER — LACTULOSE 10 GM/15ML PO SOLN: 20.0000 g | Freq: Three times a day (TID) | ORAL | Status: DC

## 2013-01-21 NOTE — Progress Notes (Signed)
  Primary Care Physician:  MCGOUGH,WILLIAM M, MD Primary Gastroenterologist:  Dr. Rourk   Chief Complaint  Patient presents with  . Elevated Hepatic Enzymes    HPI:   Ralph [name] presents today at the request of Dr. McGough secondary to elevated LFTs. He has a history of cirrhosis, and he was last seen March 2011. Somehow lost to follow-up afterward, despite our office recommending a follow-up visit. Prior cirrhosis work-up includes negative viral markers, immune to Hep A and B, normal iron studies. Last EGD in 2010 with Grade 1 varices, erosive esophagitis, portal gastropathy, normal D1 and D2.   Black, tarry stool around Aug 2014. Recent labs show new onset anemia. Hgb 16 in Dec 2013, most recent 12.1 in Sept 2014. Normocytic. Isolated Alk Phos elevation at 423. GGT elevated in the 500 range.  No bright red blood per rectum. Notes chronic LUQ pain. Constant, underlying, right now a 1 on a 1-10 scale. Not associated with eating/drinking, no aggravating or relieving factors. Worst pain is 8 or 9. Has had associated dry heaves. +nausea. Good appetite. Sometimes early satiety. Has had fluctuations in weight. States his baseline weight was in the 275 range. No GERD breakthrough. +pill dysphagia, sometimes solid food dysphagia. Dry textures are difficult. No constipation, diarrhea, change in bowel habits. Itching under his arms. Sometimes feels confused. For example, not remembering the day of the week. No hx of IV drug abuse, no blood transfusions. Occasional lower extremity edema. On lasix 20 mg. 2 bowel movements a day.   Past Medical History  Diagnosis Date  . Hypertension   . Cirrhosis   . Depressed   . Varicose vein     of esophagus  . Difficult intubation     "trouble waking up afterwards" (01/06/2012)  . CHF (congestive heart failure) 01/06/2012  . Sinus pause 01/06/2012    5.2 seconds  . Anginal pain   . Sleep apnea     "don't wear mask" (01/06/2012)  . Emphysema   . Type II  diabetes mellitus   . GERD (gastroesophageal reflux disease)   . H/O hiatal hernia   . Coughing up blood     "comes from my throat" (01/06/2012)  . Arthritis     "back; fingers" (01/06/2012)  . Chronic lower back pain   . Fatty liver disease, nonalcoholic   . CHB (complete heart block)   . Presence of permanent cardiac pacemaker 9/292013    [facility]    Past Surgical History  Procedure Laterality Date  . Back surgery    . Spinal cord stimulator implant  2006  . Cholecystectomy  1993  . Nasal septum surgery  1992  . Tonsillectomy and adenoidectomy  1992  . Posterior fusion lumbar spine  1999    L4-5  . Lumbar disc surgery  1994; ~ 1995; ~ 1996  . Warthin's tumor excision  1990's    right  . Permanent pacemaker insertion  01/08/2012    [facility]B  . Us echocardiography  12/28/2011    mild LVH,mild mitral annulara ca+,mild MR  . Nuclear stress test  10/19/2004    No ischemia  . Esophagogastroduodenoscopy  11/08/2004    RMR:Distal esophageal erosions consistent with erosive reflux esophagitis/Areas of hemorrhage and nodularity of the fundal mucosa of uncertain significance, biopsied.  Small hiatal hernia, otherwise normal stomach  . Colonoscopy  11/08/2004    RMR:Normal rectum, colon, TI.  . Esophagogastroduodenoscopy  2010    Dr. Rourk: 3 columns Grade 1 varices, erosive esophagitis,   HH, portal gastropathy, normal D1, D2    Current Outpatient Prescriptions  Medication Sig Dispense Refill  . albuterol (PROVENTIL HFA;VENTOLIN HFA) 108 (90 BASE) MCG/ACT inhaler Inhale 2 puffs into the lungs every 6 (six) hours as needed for wheezing.      . aspirin EC 81 MG tablet Take 81 mg by mouth daily.      . buPROPion (WELLBUTRIN SR) 150 MG 12 hr tablet Take 150 mg by mouth 2 (two) times daily.      . diazepam (VALIUM) 10 MG tablet Take 10 mg by mouth every 6 (six) hours as needed for anxiety.      . diltiazem (CARDIZEM CD) 360 MG 24 hr capsule Take 360 mg by mouth every morning.      . fluticasone  (FLONASE) 50 MCG/ACT nasal spray Place 1 spray into the nose daily.  16 g  0  . furosemide (LASIX) 20 MG tablet Take 20 mg by mouth every morning.      . HYDROcodone-acetaminophen (NORCO) 10-325 MG per tablet Take 1 tablet by mouth every 6 (six) hours as needed for pain.      . insulin glargine (LANTUS) 100 units/mL SOLN Inject 100-105 Units into the skin daily at 10 pm. Pt uses 100 units nightly & gives additional 5units if blood sugar elevated >130.      . loratadine (CLARITIN) 10 MG tablet Take 1 tablet (10 mg total) by mouth daily.  15 tablet  0  . metFORMIN (GLUCOPHAGE) 850 MG tablet Take 850 mg by mouth 2 (two) times daily.      . metoprolol tartrate (LOPRESSOR) 25 MG tablet Take 1 tablet (25 mg total) by mouth 2 (two) times daily.  60 tablet  11  . mometasone-formoterol (DULERA) 200-5 MCG/ACT AERO Inhale 2 puffs into the lungs 2 (two) times daily.      . pantoprazole (PROTONIX) 40 MG tablet Take 40 mg by mouth every morning.      . potassium chloride (K-DUR) 10 MEQ tablet Take 10 mEq by mouth every morning.      . rivaroxaban (XARELTO) 20 MG TABS tablet Take 1 tablet (20 mg total) by mouth daily with supper.  30 tablet  0  . simvastatin (ZOCOR) 20 MG tablet Take 20 mg by mouth every evening.      . tiotropium (SPIRIVA) 18 MCG inhalation capsule Place 18 mcg into inhaler and inhale every morning.       . lactulose (CHRONULAC) 10 GM/15ML solution Take 30 mLs (20 g total) by mouth 3 (three) times daily. To achieve 3 soft bowel movements daily  240 mL  3   No current facility-administered medications for this visit.    Allergies as of 01/21/2013 - Review Complete 01/21/2013  Allergen Reaction Noted  . Nitroglycerin Hives, Swelling, and Rash     Family History  Problem Relation Age of Onset  . Diabetes Neg Hx   . Colon cancer Neg Hx     History   Social History  . Marital Status: Married    Spouse Name: N/A    Number of Children: N/A  . Years of Education: N/A   Occupational  History  . Not on file.   Social History Main Topics  . Smoking status: Former Smoker -- 45 years    Types: Cigarettes  . Smokeless tobacco: Former User    Quit date: 11/13/2012     Comment: using Welbutrin and e-cigarettes-stopped smoking 12-03-12  . Alcohol Use: No       Comment: "quit alcohol 2011"  . Drug Use: No  . Sexual Activity: Not Currently   Other Topics Concern  . Not on file   Social History Narrative  . No narrative on file    Review of Systems: Gen: Denies any fever, chills, fatigue, weight loss, lack of appetite.  CV: +palpitations Resp: +DOE, on O2 GI: see HPI GU : Denies urinary burning, urinary frequency, urinary hesitancy MS: Denies joint pain, muscle weakness, cramps, or limitation of movement.  Derm: Denies rash, itching, dry skin Psych: occasional confusion Heme: Denies bruising, bleeding, and enlarged lymph nodes.  Physical Exam: BP 117/63  Pulse 63  Temp(Src) 97.6 F (36.4 C) (Oral)  Ht 5' 10" (1.778 m)  Wt 249 lb 6.4 oz (113.127 kg)  BMI 35.79 kg/m2 General:   Alert and oriented. Pleasant and cooperative. Well-nourished and well-developed.  Head:  Normocephalic and atraumatic. Eyes:  Without icterus, sclera clear and conjunctiva pink.  Ears:  Normal auditory acuity. Nose:  No deformity, discharge,  or lesions. Mouth:  No deformity or lesions, oral mucosa pink.  Neck:  Supple, without mass or thyromegaly. Lungs:  Clear to auscultation bilaterally. No wheezes, rales, or rhonchi. No distress.  Heart:  S1, S2 present without murmurs appreciated.  Abdomen:  +BS, soft, obese, non-tender and non-distended. Ventral hernia. Difficult to appreciate HSM due to large body habitus.  Rectal:  Deferred  Msk:  Symmetrical without gross deformities. Normal posture. Extremities:  2+ lower extremity edema Neurologic:  Alert and  oriented x4;  grossly normal neurologically. Skin:  Intact without significant lesions or rashes. Cervical Nodes:  No significant  cervical adenopathy. Psych:  Alert and cooperative. Normal mood and affect.    

## 2013-01-21 NOTE — Patient Instructions (Signed)
Please complete blood work when you are able. Let us know how your wife is doing. Definitely take care of her first, then obtain labs this week when you are able.  We have scheduled you for an ultrasound of your belly to evaluate your liver further.  Please complete the stool sample and return to our office.  I have sent in a prescription for lactulose to your pharmacy. You will take this 3 times a day to achieve 3 soft bowel movements a day. You can titrate this as needed.   We will likely need to do a colonoscopy and upper endoscopy in the near future but will need to talk to cardiology about the Xarelto.

## 2013-01-22 ENCOUNTER — Encounter: Payer: Self-pay | Admitting: Gastroenterology

## 2013-01-22 ENCOUNTER — Telehealth: Payer: Self-pay

## 2013-01-22 DIAGNOSIS — R1013 Epigastric pain: Secondary | ICD-10-CM | POA: Insufficient documentation

## 2013-01-22 DIAGNOSIS — K7460 Unspecified cirrhosis of liver: Secondary | ICD-10-CM | POA: Insufficient documentation

## 2013-01-22 DIAGNOSIS — D649 Anemia, unspecified: Secondary | ICD-10-CM | POA: Insufficient documentation

## 2013-01-22 NOTE — Assessment & Plan Note (Signed)
New onset, microcytic. Check repeat CBC, iron, ferritin now. At minimum, proceed with EGD in near future. May need colonoscopy if evidence of IDA

## 2013-01-22 NOTE — Telephone Encounter (Signed)
Dr. Wyline Mood,   This pt was seen at our office on 01/21/2013 by Gerrit Halls, NP. She would like to schedule pt for a colonoscopy and EGD in the near future. Please advise if pt can hold Xarelto prior to the procedure and for how long. Thanks very much!

## 2013-01-22 NOTE — Assessment & Plan Note (Signed)
Chronic LUQ pain, associated dry heaves, nausea. Early satiety. Possible melena a month ago. Concern for gastritis, PUD, especially with new onset anemia documented. Pill and solid food dysphagia reported. Last EGD in 2010 as described in HPI. On Xarelto. Will need to clear with cardiology for holding Xarelto. Discussed EGD/ED with Dr. Jena Gauss in near future. Patient understand risks and benefits and desires to proceed.

## 2013-01-22 NOTE — Telephone Encounter (Signed)
PLEASE ADVISE.

## 2013-01-22 NOTE — Assessment & Plan Note (Addendum)
Lost to follow-up, last seen 2011. Overdue for [facility] screening. Notable isolated elevation of alk phos in the 400 range. Prior cirrhosis work--up included negative viral markers, normal iron studies. Needs further work-up to include labs as outlined below. Also needs updated [place] of abdomen. Due to self-reported mild intermittent confusion, start Lactulose. Immune to Hep A and B. EGD 2010 with Grade 1 varices. Will be proceeding with EGD/ED in near future due to dyspepsia, dysphagia.   AMA, ANA, ASMA, immunoglobulins, PT/INR, CBC, ceruloplasmin, alpha-1 antitrypsin, ferritin, iron, BMP.  [place] of abdomen Likely add aldactone to lasix regimen due to lower extremity edema. Review BMP first.

## 2013-01-22 NOTE — Telephone Encounter (Signed)
It is okay to hold his xarelto. The recommendation is to hold at least 3 days prior to the procedure. He does not have an indication to be bridged with heprain. Resume xarelto after, or may hold for an additional 2-3 days if biopsies taken.

## 2013-01-22 NOTE — Assessment & Plan Note (Signed)
Solid food and pill dysphagia. Proceed with dilation at time of EGD.

## 2013-01-23 NOTE — Telephone Encounter (Signed)
Routing to Tobi Bastos and Soledad Gerlach.

## 2013-01-23 NOTE — Progress Notes (Signed)
cc'd to pcp 

## 2013-01-23 NOTE — Telephone Encounter (Signed)
FYI:Pt informed about instructions, advised if biopsies taken they may need to hold additional 2-3 days, pt understood

## 2013-01-24 NOTE — Telephone Encounter (Signed)
Noted  

## 2013-01-24 NOTE — Telephone Encounter (Signed)
No, I need him to complete labs first to see if he needs a colonoscopy as well.

## 2013-01-24 NOTE — Telephone Encounter (Signed)
Ralph [name] am I supposed to be scheduling [name] at this point?

## 2013-01-24 NOTE — Progress Notes (Signed)
Patient needs to complete blood work as requested. He completed a questionnaire, which was electronically sent back to [place], reporting his abdomen is still tight. He did not have tense ascites on exam, but I do think his diuretics need to be adjusted.   Need to complete labs as requested. This will help [place] decide if he needs a colonoscopy or not. Will be able to hold Xarelto X 3 days prior. Need labs before so that we can know if a colonoscopy needs to be done at time of EGD/ED.

## 2013-01-25 NOTE — Telephone Encounter (Signed)
Called and informed pt to get his labs done so Tobi Bastos will know whether to order TCS. He said he will do so.

## 2013-01-26 LAB — CBC WITH DIFFERENTIAL/PLATELET
Basophils Absolute: 0.1 10*3/uL (ref 0.0–0.1)
Eosinophils Absolute: 0.7 10*3/uL (ref 0.0–0.7)
Lymphocytes Relative: 22 % (ref 12–46)
Lymphs Abs: 2 10*3/uL (ref 0.7–4.0)
MCH: 30.3 pg (ref 26.0–34.0)
Neutrophils Relative %: 62 % (ref 43–77)
Platelets: 285 10*3/uL (ref 150–400)
RBC: 4.58 MIL/uL (ref 4.22–5.81)
RDW: 16.8 % — ABNORMAL HIGH (ref 11.5–15.5)
WBC: 8.9 10*3/uL (ref 4.0–10.5)

## 2013-01-26 LAB — HEPATIC FUNCTION PANEL
AST: 29 U/L (ref 0–37)
Albumin: 3.9 g/dL (ref 3.5–5.2)
Alkaline Phosphatase: 286 U/L — ABNORMAL HIGH (ref 39–117)
Bilirubin, Direct: 0.1 mg/dL (ref 0.0–0.3)
Indirect Bilirubin: 0.4 mg/dL (ref 0.0–0.9)
Total Bilirubin: 0.5 mg/dL (ref 0.3–1.2)

## 2013-01-26 LAB — IRON: Iron: 65 ug/dL (ref 42–165)

## 2013-01-26 LAB — ALPHA-1-ANTITRYPSIN: A-1 Antitrypsin, Ser: 185 mg/dL (ref 90–200)

## 2013-01-26 LAB — IGG, IGA, IGM: IgM, Serum: 69 mg/dL (ref 41–251)

## 2013-01-26 LAB — BASIC METABOLIC PANEL
Calcium: 9 mg/dL (ref 8.4–10.5)
Glucose, Bld: 112 mg/dL — ABNORMAL HIGH (ref 70–99)
Potassium: 4.3 mEq/L (ref 3.5–5.3)
Sodium: 138 mEq/L (ref 135–145)

## 2013-01-28 ENCOUNTER — Ambulatory Visit (HOSPITAL_COMMUNITY)
Admission: RE | Admit: 2013-01-28 | Discharge: 2013-01-28 | Disposition: A | Discharge status: Home / Self Care | Payer: Medicare Other | Source: Ambulatory Visit | Attending: Gastroenterology | Admitting: Gastroenterology

## 2013-01-28 DIAGNOSIS — R160 Hepatomegaly, not elsewhere classified: Secondary | ICD-10-CM | POA: Insufficient documentation

## 2013-01-28 DIAGNOSIS — K7460 Unspecified cirrhosis of liver: Secondary | ICD-10-CM | POA: Insufficient documentation

## 2013-01-29 ENCOUNTER — Ambulatory Visit (INDEPENDENT_AMBULATORY_CARE_PROVIDER_SITE_OTHER): Payer: Medicare Other | Admitting: Gastroenterology

## 2013-01-29 DIAGNOSIS — D649 Anemia, unspecified: Secondary | ICD-10-CM

## 2013-01-29 LAB — ANTI-SMOOTH MUSCLE ANTIBODY, IGG: Smooth Muscle Ab: 27 U — ABNORMAL HIGH (ref <20)

## 2013-01-29 NOTE — Progress Notes (Signed)
Lab results are in epic

## 2013-01-30 ENCOUNTER — Telehealth: Payer: Self-pay | Admitting: Gastroenterology

## 2013-01-30 MED ORDER — SPIRONOLACTONE 50 MG PO TABS: 50.0000 mg | ORAL_TABLET | Freq: Every day | ORAL | Status: DC

## 2013-01-30 NOTE — Progress Notes (Signed)
Quick Note:  Please see above recommendations.  I would like to check a BMP in about 1 week as well. ______

## 2013-01-30 NOTE — Telephone Encounter (Signed)
Start Aldactone 50 mg daily. Continue taking Lasix 20 mg daily. Stop Potassium. Sent to pharmacy. Sent note to patient in MyChart.

## 2013-01-30 NOTE — Progress Notes (Signed)
Pt returned one iFOBT and it was negative.  

## 2013-01-30 NOTE — Progress Notes (Signed)
Quick Note:  Hgb stable at 13.9, iron and ferritin normal.  Alk phos remains elevated (286), good renal function.  Thorough blood-work obtained, with weakly positive ASMA. Will need to review this with Dr. Jena Gauss.  [place] of abdomen with fatty liver but no nodularity noted of liver. No HCC.  Start Aldactone 50 mg daily, take in morning with Lasix 20 mg daily. STOP POTASSIUM SUPPLEMENT. I have sent this to pharmacy and notified patient through MyChart.   IFOBT NEGATIVE.  Proceed with EGD/ED with RMR due to dyspepsia and dysphagia. Hold off on colonoscopy, as he has no lower GI symptoms.   ______

## 2013-01-31 ENCOUNTER — Other Ambulatory Visit: Payer: Self-pay | Admitting: Internal Medicine

## 2013-01-31 ENCOUNTER — Other Ambulatory Visit: Payer: Self-pay

## 2013-01-31 ENCOUNTER — Encounter (HOSPITAL_COMMUNITY): Payer: Self-pay | Admitting: Pharmacy Technician

## 2013-01-31 ENCOUNTER — Other Ambulatory Visit: Payer: Self-pay | Admitting: Gastroenterology

## 2013-01-31 ENCOUNTER — Encounter: Payer: Self-pay | Admitting: Gastroenterology

## 2013-01-31 DIAGNOSIS — R1013 Epigastric pain: Secondary | ICD-10-CM

## 2013-01-31 DIAGNOSIS — K7460 Unspecified cirrhosis of liver: Secondary | ICD-10-CM

## 2013-01-31 DIAGNOSIS — R1319 Other dysphagia: Secondary | ICD-10-CM

## 2013-01-31 NOTE — Telephone Encounter (Signed)
Patient is aware 

## 2013-01-31 NOTE — Telephone Encounter (Signed)
Correction: hold Xarelto X 3 days prior to EGD/ED.

## 2013-01-31 NOTE — Telephone Encounter (Signed)
Please make sure patient knows to hold Xarelto 3 days prior to colonoscopy.

## 2013-02-06 LAB — COMPREHENSIVE METABOLIC PANEL
ALT: 34 U/L (ref 10–40)
ALT: 56 U/L — AB (ref 10–40)
AST: 23 U/L
AST: 32 U/L
Albumin: 3.3
Alkaline Phosphatase: 423 U/L
Total Bilirubin: 0.7 mg/dL

## 2013-02-06 LAB — CBC WITH DIFFERENTIAL/PLATELET: GGT: 575 U/L — AB (ref 18–76)

## 2013-02-06 LAB — CBC
HCT: 47 %
HGB: 16.3 g/dL

## 2013-02-07 ENCOUNTER — Encounter: Payer: Self-pay | Admitting: Cardiology

## 2013-02-07 ENCOUNTER — Ambulatory Visit (INDEPENDENT_AMBULATORY_CARE_PROVIDER_SITE_OTHER): Payer: Medicare Other | Admitting: Cardiology

## 2013-02-07 VITALS — BP 124/74 | HR 72 | Ht 71.0 in | Wt 174.0 lb

## 2013-02-07 DIAGNOSIS — I4891 Unspecified atrial fibrillation: Secondary | ICD-10-CM

## 2013-02-07 NOTE — Patient Instructions (Signed)
Your physician recommends that you schedule a follow-up appointment in: 4 months.  Your physician recommends that you continue on your current medications as directed. Please refer to the Current Medication list given to you today.  

## 2013-02-07 NOTE — Progress Notes (Signed)
Clinical Summary [name] is a 62 y.o.male who presents today for follow up. Today we focused on his atrial fibrillation, for comprehensive clinic note refer to my note 01/03/13  1. Afib  - on diltiazem, started on multaq 400mg  bid during last visit to [facility] 11/30/12  - started multaq, reports felt very shaky and nauseous on it w/ increased SOB. Was on for just 2-3 days and then stopped on his own w/ resolution of symptoms.  - at last visit started him on low dose metoprolol, reports no significant symptoms on this regimen - compliant with xarelto, denies any bleeding issues      Past Medical History  Diagnosis Date  . Hypertension   . Cirrhosis   . Depressed   . Varicose vein     of esophagus  . Difficult intubation     "trouble waking up afterwards" (01/06/2012)  . CHF (congestive heart failure) 01/06/2012  . Sinus pause 01/06/2012    5.2 seconds  . Anginal pain   . Sleep apnea     "don't wear mask" (01/06/2012)  . Emphysema   . Type II diabetes mellitus   . GERD (gastroesophageal reflux disease)   . H/O hiatal hernia   . Coughing up blood     "comes from my throat" (01/06/2012)  . Arthritis     "back; fingers" (01/06/2012)  . Chronic lower back pain   . Fatty liver disease, nonalcoholic   . CHB (complete heart block)   . Presence of permanent cardiac pacemaker 9/292013    [facility]     Allergies  Allergen Reactions  . Nitroglycerin Hives, Swelling and Rash     Current Outpatient Prescriptions  Medication Sig Dispense Refill  . albuterol (PROVENTIL HFA;VENTOLIN HFA) 108 (90 BASE) MCG/ACT inhaler Inhale 2 puffs into the lungs every 6 (six) hours as needed for wheezing.      Marland Kitchen aspirin EC 81 MG tablet Take 81 mg by mouth daily.      Marland Kitchen buPROPion (WELLBUTRIN SR) 150 MG 12 hr tablet Take 150 mg by mouth 2 (two) times daily.      . diazepam (VALIUM) 10 MG tablet Take 10 mg by mouth every 6 (six) hours as needed for anxiety.      Marland Kitchen diltiazem (CARDIZEM CD) 360  MG 24 hr capsule Take 360 mg by mouth every morning.      . fluticasone (FLONASE) 50 MCG/ACT nasal spray Place 1 spray into the nose daily.  16 g  0  . furosemide (LASIX) 20 MG tablet Take 20 mg by mouth every morning.      Marland Kitchen HYDROcodone-acetaminophen (NORCO) 10-325 MG per tablet Take 1 tablet by mouth every 6 (six) hours as needed for pain.      Marland Kitchen insulin glargine (LANTUS) 100 units/mL SOLN Inject 100-105 Units into the skin daily at 10 pm. Pt uses 100 units nightly & gives additional 5units if blood sugar elevated >130.      . metFORMIN (GLUCOPHAGE) 850 MG tablet Take 850 mg by mouth 2 (two) times daily.      . metoprolol tartrate (LOPRESSOR) 25 MG tablet Take 1 tablet (25 mg total) by mouth 2 (two) times daily.  60 tablet  11  . mometasone-formoterol (DULERA) 200-5 MCG/ACT AERO Inhale 2 puffs into the lungs 2 (two) times daily.      . pantoprazole (PROTONIX) 40 MG tablet Take 40 mg by mouth every morning.      . rivaroxaban (XARELTO) 20 MG  TABS tablet Take 1 tablet (20 mg total) by mouth daily with supper.  30 tablet  0  . simvastatin (ZOCOR) 20 MG tablet Take 20 mg by mouth every evening.      Marland Kitchen spironolactone (ALDACTONE) 50 MG tablet Take 1 tablet (50 mg total) by mouth daily.  30 tablet  3   No current facility-administered medications for this visit.     Past Surgical History  Procedure Laterality Date  . Back surgery    . Spinal cord stimulator implant  2006  . Cholecystectomy  1993  . Nasal septum surgery  1992  . Tonsillectomy and adenoidectomy  1992  . Posterior fusion lumbar spine  1999    L4-5  . Lumbar disc surgery  1994; ~ 1995; ~ 1996  . Warthin's tumor excision  1990's    right  . Permanent pacemaker insertion  01/08/2012    CHB  . US echocardiography  12/28/2011    mild LVH,mild mitral annulara ca+,mild MR  . Nuclear stress test  10/19/2004    No ischemia  . Esophagogastroduodenoscopy  11/08/2004    [name]:WRUEAV esophageal erosions consistent with erosive reflux  esophagitis/Areas of hemorrhage and nodularity of the fundal mucosa of uncertain significance, biopsied.  Small hiatal hernia, otherwise normal stomach  . Colonoscopy  11/08/2004    WUJ:[id number] rectum, colon, TI.  Marland Kitchen Esophagogastroduodenoscopy  2010    Dr. Jena Gauss: 3 columns Grade 1 varices, erosive esophagitis, HH, portal gastropathy, normal D1, D2     Allergies  Allergen Reactions  . Nitroglycerin Hives, Swelling and Rash      Family History  Problem Relation Age of Onset  . Diabetes Neg Hx   . Colon cancer Neg Hx      Social History [name] reports that he has quit smoking. His smoking use included Cigarettes. He smoked 0.00 packs per day for 45 years. He quit smokeless tobacco use about 2 months ago. [name] reports that he does not drink alcohol.   Review of Systems CONSTITUTIONAL: No weight loss, fever, chills, weakness or fatigue.  HEENT: Eyes: No visual loss, blurred vision, double vision or yellow sclerae.No hearing loss, sneezing, congestion, runny nose or sore throat.  SKIN: No rash or itching.  CARDIOVASCULAR: per HPI RESPIRATORY: chronic SOB  GASTROINTESTINAL: No anorexia, nausea, vomiting or diarrhea. No abdominal pain or blood.  GENITOURINARY: No burning on urination, no polyuria NEUROLOGICAL: No headache, dizziness, syncope, paralysis, ataxia, numbness or tingling in the extremities. No change in bowel or bladder control.  MUSCULOSKELETAL: No muscle, back pain, joint pain or stiffness.  LYMPHATICS: No enlarged nodes. No history of splenectomy.  PSYCHIATRIC: No history of depression or anxiety.  ENDOCRINOLOGIC: No reports of sweating, cold or heat intolerance. No polyuria or polydipsia.  Marland Kitchen   Physical Examination p 72 bp 124/74 Wt 174 lbs BMI 24 Gen: resting comfortably, no acute distress HEENT: no scleral icterus, pupils equal round and reactive, no palptable cervical adenopathy,  CV: RRR, no m/r/g, no JVD, no carotid bruits Resp: Clear to auscultation  bilaterally GI: abdomen is soft, non-tender, non-distended, normal bowel sounds, no hepatosplenomegaly MSK: extremities are warm, no edema.  Skin: warm, no rash Neuro:  no focal deficits Psych: appropriate affect   Diagnostic Studies Jan 2014 Myoview: no ischemia   11/2012 Echo: LVEF 60-65%, mild LVH, moderate basal septal hypertrophy, mild LAE     Assessment and Plan  1. Afib  - did not tolerate side effects of dronederone, on dilt and low dose metoprolol his  symptoms are well controlled - there is no absolute contraindication to beta-1 specific beta blockers in COPD, since starting metoprolol his COPD is stable - continue current therapy, continue xarelto for stroke prevention, his CHADS2 score is 2 (HTN, DM)    F/u 4 months. He has follow up in pacemaker clinic next week.        Antoine Poche, [name], F.A.C.C.

## 2013-02-14 ENCOUNTER — Encounter (HOSPITAL_COMMUNITY): Payer: Self-pay | Admitting: *Deleted

## 2013-02-14 ENCOUNTER — Encounter (HOSPITAL_COMMUNITY)
Admission: RE | Disposition: A | Discharge status: Home / Self Care | Payer: Self-pay | Source: Ambulatory Visit | Attending: Internal Medicine

## 2013-02-14 ENCOUNTER — Other Ambulatory Visit: Payer: Self-pay

## 2013-02-14 ENCOUNTER — Ambulatory Visit (HOSPITAL_COMMUNITY)
Admission: RE | Admit: 2013-02-14 | Discharge: 2013-02-14 | Disposition: A | Discharge status: Home / Self Care | Payer: Medicare Other | Source: Ambulatory Visit | Attending: Internal Medicine | Admitting: Internal Medicine

## 2013-02-14 DIAGNOSIS — I8500 Esophageal varices without bleeding: Secondary | ICD-10-CM

## 2013-02-14 DIAGNOSIS — R1319 Other dysphagia: Secondary | ICD-10-CM

## 2013-02-14 DIAGNOSIS — K319 Disease of stomach and duodenum, unspecified: Secondary | ICD-10-CM

## 2013-02-14 DIAGNOSIS — I10 Essential (primary) hypertension: Secondary | ICD-10-CM | POA: Insufficient documentation

## 2013-02-14 DIAGNOSIS — K3189 Other diseases of stomach and duodenum: Secondary | ICD-10-CM | POA: Insufficient documentation

## 2013-02-14 DIAGNOSIS — R1013 Epigastric pain: Secondary | ICD-10-CM

## 2013-02-14 DIAGNOSIS — R1310 Dysphagia, unspecified: Secondary | ICD-10-CM | POA: Insufficient documentation

## 2013-02-14 DIAGNOSIS — K2960 Other gastritis without bleeding: Secondary | ICD-10-CM

## 2013-02-14 DIAGNOSIS — Z01812 Encounter for preprocedural laboratory examination: Secondary | ICD-10-CM | POA: Insufficient documentation

## 2013-02-14 DIAGNOSIS — E119 Type 2 diabetes mellitus without complications: Secondary | ICD-10-CM | POA: Insufficient documentation

## 2013-02-14 HISTORY — PX: ESOPHAGOGASTRODUODENOSCOPY (EGD) WITH ESOPHAGEAL DILATION: SHX5812

## 2013-02-14 LAB — GLUCOSE, CAPILLARY: Glucose-Capillary: 111 mg/dL — ABNORMAL HIGH (ref 70–99)

## 2013-02-14 SURGERY — ESOPHAGOGASTRODUODENOSCOPY (EGD) WITH ESOPHAGEAL DILATION
Anesthesia: Moderate Sedation

## 2013-02-14 MED ORDER — MEPERIDINE HCL 100 MG/ML IJ SOLN
INTRAMUSCULAR | Status: DC | PRN
  Administered 2013-02-14: 25 mg via INTRAVENOUS
  Administered 2013-02-14: 50 mg via INTRAVENOUS

## 2013-02-14 MED ORDER — SODIUM CHLORIDE 0.9 % IV SOLN
INTRAVENOUS | Status: DC
  Administered 2013-02-14: 13:00:00 via INTRAVENOUS

## 2013-02-14 MED ORDER — BUTAMBEN-TETRACAINE-BENZOCAINE 2-2-14 % EX AERO
INHALATION_SPRAY | CUTANEOUS | Status: DC | PRN
  Administered 2013-02-14: 2 via TOPICAL

## 2013-02-14 MED ORDER — DEXTROSE IN LACTATED RINGERS 5 % IV SOLN
INTRAVENOUS | Status: DC
  Administered 2013-02-14: 13:00:00 via INTRAVENOUS

## 2013-02-14 MED ORDER — ONDANSETRON HCL 4 MG/2ML IJ SOLN
INTRAMUSCULAR | Status: AC
  Filled 2013-02-14: qty 2

## 2013-02-14 MED ORDER — MIDAZOLAM HCL 5 MG/5ML IJ SOLN
INTRAMUSCULAR | Status: AC
  Filled 2013-02-14: qty 10

## 2013-02-14 MED ORDER — STERILE WATER FOR IRRIGATION IR SOLN
Status: DC | PRN
  Administered 2013-02-14: 14:00:00

## 2013-02-14 MED ORDER — MIDAZOLAM HCL 5 MG/5ML IJ SOLN
INTRAMUSCULAR | Status: DC | PRN
  Administered 2013-02-14: 1 mg via INTRAVENOUS
  Administered 2013-02-14: 2 mg via INTRAVENOUS

## 2013-02-14 MED ORDER — MEPERIDINE HCL 100 MG/ML IJ SOLN
INTRAMUSCULAR | Status: AC
  Filled 2013-02-14: qty 2

## 2013-02-14 NOTE — H&P (View-Only) (Signed)
Primary Care Physician:  [name], MD Primary Gastroenterologist:  Dr. Jena Gauss   Chief Complaint  Patient presents with  . Elevated Hepatic Enzymes    HPI:   Ralph [name] presents today at the request of Dr. Regino Schultze secondary to elevated LFTs. He has a history of cirrhosis, and he was last seen March 2011. Somehow lost to follow-up afterward, despite our office recommending a follow-up visit. Prior cirrhosis work-up includes negative viral markers, immune to Hep A and B, normal iron studies. Last EGD in 2010 with Grade 1 varices, erosive esophagitis, portal gastropathy, normal D1 and D2.   Black, tarry stool around Aug 2014. Recent labs show new onset anemia. Hgb 16 in Dec 2013, most recent 12.1 in Sept 2014. Normocytic. Isolated Alk Phos elevation at 423. GGT elevated in the 500 range.  No bright red blood per rectum. Notes chronic LUQ pain. Constant, underlying, right now a 1 on a 1-10 scale. Not associated with eating/drinking, no aggravating or relieving factors. Worst pain is 8 or 9. Has had associated dry heaves. +nausea. Good appetite. Sometimes early satiety. Has had fluctuations in weight. States his baseline weight was in the 275 range. No GERD breakthrough. +pill dysphagia, sometimes solid food dysphagia. Dry textures are difficult. No constipation, diarrhea, change in bowel habits. Itching under his arms. Sometimes feels confused. For example, not remembering the day of the week. No hx of IV drug abuse, no blood transfusions. Occasional lower extremity edema. On lasix 20 mg. 2 bowel movements a day.   Past Medical History  Diagnosis Date  . Hypertension   . Cirrhosis   . Depressed   . Varicose vein     of esophagus  . Difficult intubation     "trouble waking up afterwards" (01/06/2012)  . CHF (congestive heart failure) 01/06/2012  . Sinus pause 01/06/2012    5.2 seconds  . Anginal pain   . Sleep apnea     "don't wear mask" (01/06/2012)  . Emphysema   . Type II  diabetes mellitus   . GERD (gastroesophageal reflux disease)   . H/O hiatal hernia   . Coughing up blood     "comes from my throat" (01/06/2012)  . Arthritis     "back; fingers" (01/06/2012)  . Chronic lower back pain   . Fatty liver disease, nonalcoholic   . CHB (complete heart block)   . Presence of permanent cardiac pacemaker 9/292013    [facility]    Past Surgical History  Procedure Laterality Date  . Back surgery    . Spinal cord stimulator implant  2006  . Cholecystectomy  1993  . Nasal septum surgery  1992  . Tonsillectomy and adenoidectomy  1992  . Posterior fusion lumbar spine  1999    L4-5  . Lumbar disc surgery  1994; ~ 1995; ~ 1996  . Warthin's tumor excision  1990's    right  . Permanent pacemaker insertion  01/08/2012    CHB  . US echocardiography  12/28/2011    mild LVH,mild mitral annulara ca+,mild MR  . Nuclear stress test  10/19/2004    No ischemia  . Esophagogastroduodenoscopy  11/08/2004    [name]:WRUEAV esophageal erosions consistent with erosive reflux esophagitis/Areas of hemorrhage and nodularity of the fundal mucosa of uncertain significance, biopsied.  Small hiatal hernia, otherwise normal stomach  . Colonoscopy  11/08/2004    WUJ:[id number] rectum, colon, TI.  Marland Kitchen Esophagogastroduodenoscopy  2010    Dr. Jena Gauss: 3 columns Grade 1 varices, erosive esophagitis,  HH, portal gastropathy, normal D1, D2    Current Outpatient Prescriptions  Medication Sig Dispense Refill  . albuterol (PROVENTIL HFA;VENTOLIN HFA) 108 (90 BASE) MCG/ACT inhaler Inhale 2 puffs into the lungs every 6 (six) hours as needed for wheezing.      Marland Kitchen aspirin EC 81 MG tablet Take 81 mg by mouth daily.      Marland Kitchen buPROPion (WELLBUTRIN SR) 150 MG 12 hr tablet Take 150 mg by mouth 2 (two) times daily.      . diazepam (VALIUM) 10 MG tablet Take 10 mg by mouth every 6 (six) hours as needed for anxiety.      Marland Kitchen diltiazem (CARDIZEM CD) 360 MG 24 hr capsule Take 360 mg by mouth every morning.      . fluticasone  (FLONASE) 50 MCG/ACT nasal spray Place 1 spray into the nose daily.  16 g  0  . furosemide (LASIX) 20 MG tablet Take 20 mg by mouth every morning.      Marland Kitchen HYDROcodone-acetaminophen (NORCO) 10-325 MG per tablet Take 1 tablet by mouth every 6 (six) hours as needed for pain.      Marland Kitchen insulin glargine (LANTUS) 100 units/mL SOLN Inject 100-105 Units into the skin daily at 10 pm. Pt uses 100 units nightly & gives additional 5units if blood sugar elevated >130.      Marland Kitchen loratadine (CLARITIN) 10 MG tablet Take 1 tablet (10 mg total) by mouth daily.  15 tablet  0  . metFORMIN (GLUCOPHAGE) 850 MG tablet Take 850 mg by mouth 2 (two) times daily.      . metoprolol tartrate (LOPRESSOR) 25 MG tablet Take 1 tablet (25 mg total) by mouth 2 (two) times daily.  60 tablet  11  . mometasone-formoterol (DULERA) 200-5 MCG/ACT AERO Inhale 2 puffs into the lungs 2 (two) times daily.      . pantoprazole (PROTONIX) 40 MG tablet Take 40 mg by mouth every morning.      . potassium chloride (K-DUR) 10 MEQ tablet Take 10 mEq by mouth every morning.      . rivaroxaban (XARELTO) 20 MG TABS tablet Take 1 tablet (20 mg total) by mouth daily with supper.  30 tablet  0  . simvastatin (ZOCOR) 20 MG tablet Take 20 mg by mouth every evening.      . tiotropium (SPIRIVA) 18 MCG inhalation capsule Place 18 mcg into inhaler and inhale every morning.       . lactulose (CHRONULAC) 10 GM/15ML solution Take 30 mLs (20 g total) by mouth 3 (three) times daily. To achieve 3 soft bowel movements daily  240 mL  3   No current facility-administered medications for this visit.    Allergies as of 01/21/2013 - Review Complete 01/21/2013  Allergen Reaction Noted  . Nitroglycerin Hives, Swelling, and Rash     Family History  Problem Relation Age of Onset  . Diabetes Neg Hx   . Colon cancer Neg Hx     History   Social History  . Marital Status: Married    Spouse Name: N/A    Number of Children: N/A  . Years of Education: N/A   Occupational  History  . Not on file.   Social History Main Topics  . Smoking status: Former Smoker -- 45 years    Types: Cigarettes  . Smokeless tobacco: Former Neurosurgeon    Quit date: 11/13/2012     Comment: using Welbutrin and e-cigarettes-stopped smoking 12-03-12  . Alcohol Use: No  Comment: "quit alcohol 2011"  . Drug Use: No  . Sexual Activity: Not Currently   Other Topics Concern  . Not on file   Social History Narrative  . No narrative on file    Review of Systems: Gen: Denies any fever, chills, fatigue, weight loss, lack of appetite.  CV: +palpitations Resp: +DOE, on O2 GI: see HPI GU : Denies urinary burning, urinary frequency, urinary hesitancy MS: Denies joint pain, muscle weakness, cramps, or limitation of movement.  Derm: Denies rash, itching, dry skin Psych: occasional confusion Heme: Denies bruising, bleeding, and enlarged lymph nodes.  Physical Exam: BP 117/63  Pulse 63  Temp(Src) 97.6 F (36.4 C) (Oral)  Ht 5\' 10"  (1.778 m)  Wt 249 lb 6.4 oz (113.127 kg)  BMI 35.79 kg/m2 General:   Alert and oriented. Pleasant and cooperative. Well-nourished and well-developed.  Head:  Normocephalic and atraumatic. Eyes:  Without icterus, sclera clear and conjunctiva pink.  Ears:  Normal auditory acuity. Nose:  No deformity, discharge,  or lesions. Mouth:  No deformity or lesions, oral mucosa pink.  Neck:  Supple, without mass or thyromegaly. Lungs:  Clear to auscultation bilaterally. No wheezes, rales, or rhonchi. No distress.  Heart:  S1, S2 present without murmurs appreciated.  Abdomen:  +BS, soft, obese, non-tender and non-distended. Ventral hernia. Difficult to appreciate HSM due to large body habitus.  Rectal:  Deferred  Msk:  Symmetrical without gross deformities. Normal posture. Extremities:  2+ lower extremity edema Neurologic:  Alert and  oriented x4;  grossly normal neurologically. Skin:  Intact without significant lesions or rashes. Cervical Nodes:  No significant  cervical adenopathy. Psych:  Alert and cooperative. Normal mood and affect.

## 2013-02-14 NOTE — Interval H&P Note (Signed)
History and Physical Interval Note:  02/14/2013 2:07 PM  Ralph [name]  has presented today for surgery, with the diagnosis of dysphagia and dyspepsia  The various methods of treatment have been discussed with the patient and family. After consideration of risks, benefits and other options for treatment, the patient has consented to  Procedure(s) with comments: ESOPHAGOGASTRODUODENOSCOPY (EGD) WITH ESOPHAGEAL DILATION (N/A) - 1:30 as a surgical intervention .  The patient's history has been reviewed, patient examined, no change in status, stable for surgery.  I have reviewed the patient's chart and labs.  Questions were answered to the patient's satisfaction.     Ralph [name]  EGD with possible esophageal dilation.The risks, benefits, limitations, alternatives and imponderables have been reviewed with the patient. Questions have been answered. All parties are agreeable.

## 2013-02-14 NOTE — OR Nursing (Signed)
Dr. Jena Gauss notified of patient taking Lantus 110 units and Metformin this morning. Blood sugar 111. Orders received and carried out.

## 2013-02-14 NOTE — Op Note (Signed)
[facility] [address] [place], [address]   ENDOSCOPY PROCEDURE REPORT  PATIENT: Ralph [name], Ralph [name]  MR#: [id number] BIRTHDATE: 1950-05-07 , 62  yrs. old GENDER: Male ENDOSCOPIST: R.  Roetta Sessions, MD FACP FACG REFERRED BY:  Karleen Hampshire, [name] PROCEDURE DATE:  02/14/2013 PROCEDURE:     EGD with Elease Hashimoto dilation followed by esophageal and gastric biopsy  INDICATIONS:     esophageal dysphagia  INFORMED CONSENT:   The risks, benefits, limitations, alternatives and imponderables have been discussed.  The potential for biopsy, esophogeal dilation, etc. have also been reviewed.  Questions have been answered.  All parties agreeable.  Please see the history and physical in the medical record for more information.  MEDICATIONS:     Versed 3 mg IV and Demerol 75 mg IV in divided doses. Zofran 4 mg IV. Cetacaine spray.  DESCRIPTION OF PROCEDURE:   The EG-[id number] ([id number])  endoscope was introduced through the mouth and advanced to the second portion of the duodenum without difficulty or limitations.  The mucosal surfaces were surveyed very carefully during advancement of the scope and upon withdrawal.  Retroflexion view of the proximal stomach and esophagogastric junction was performed.      FINDINGS:   (2) short columns grade 1 esophageal varices. Undulating Z line versus short segment Barrett's esophagus. No esophagitis. Stomach empty. Diffuse mucosal changes consistent with portal gastropathy. Couple of antral erosions. No ulcer or infiltrating process. No gastric varices. Patent pylorus. Normal first and second portion of the duodenum.  THERAPEUTIC / DIAGNOSTIC MANEUVERS PERFORMED:  A 56 French Maloney dilators passed to full insertion with ease. A look back revealed no apparent complication related this maneuver. Subsequently, biopsies of the distal abnormal esophagus taken for histologic study. Finally, biopsies abnormal gastric antrum  taken.   COMPLICATIONS:  None  IMPRESSION:   Grade 1 esophageal varices. Abnormal distal esophagus/status post biopsy after Maloney dilation. Portal gastropathy. Antral erosions-status post biopsy  RECOMMENDATIONS:  Follow up on pathology. Office visit with [place] in 2 months.    _______________________________ R. Roetta Sessions, MD FACP [facility] eSigned:  R. Roetta Sessions, MD FACP [facility] 02/14/2013 2:44 PM     CC:

## 2013-02-15 ENCOUNTER — Ambulatory Visit (INDEPENDENT_AMBULATORY_CARE_PROVIDER_SITE_OTHER): Payer: Medicare Other | Admitting: *Deleted

## 2013-02-15 DIAGNOSIS — I4892 Unspecified atrial flutter: Secondary | ICD-10-CM

## 2013-02-15 DIAGNOSIS — I4891 Unspecified atrial fibrillation: Secondary | ICD-10-CM

## 2013-02-15 NOTE — Progress Notes (Signed)
PPM check 

## 2013-02-18 ENCOUNTER — Encounter: Payer: Self-pay | Admitting: Gastroenterology

## 2013-02-19 ENCOUNTER — Encounter (HOSPITAL_COMMUNITY): Payer: Self-pay | Admitting: Internal Medicine

## 2013-02-19 ENCOUNTER — Encounter: Payer: Self-pay | Admitting: Internal Medicine

## 2013-02-20 LAB — MDC_IDC_ENUM_SESS_TYPE_INCLINIC
Battery Remaining Longevity: 99.6 mo
Battery Voltage: 2.95 V
Date Time Interrogation Session: 20141107193612
Implantable Pulse Generator Serial Number: 7393982
Lead Channel Pacing Threshold Amplitude: 0.625 V
Lead Channel Pacing Threshold Amplitude: 0.75 V
Lead Channel Pacing Threshold Pulse Width: 0.4 ms
Lead Channel Sensing Intrinsic Amplitude: 5.1 mV
Lead Channel Setting Pacing Amplitude: 1 V
Lead Channel Setting Pacing Amplitude: 1.625
Lead Channel Setting Pacing Pulse Width: 0.4 ms
Lead Channel Setting Sensing Sensitivity: 2 mV

## 2013-02-22 ENCOUNTER — Encounter: Payer: Self-pay | Admitting: Internal Medicine

## 2013-03-01 ENCOUNTER — Encounter: Payer: Self-pay | Admitting: Gastroenterology

## 2013-03-04 LAB — BASIC METABOLIC PANEL
Calcium: 9.8 mg/dL (ref 8.4–10.5)
Glucose, Bld: 278 mg/dL — ABNORMAL HIGH (ref 70–99)
Potassium: 4.6 mEq/L (ref 3.5–5.3)
Sodium: 138 mEq/L (ref 135–145)

## 2013-04-01 ENCOUNTER — Telehealth: Payer: Self-pay | Admitting: *Deleted

## 2013-04-01 ENCOUNTER — Other Ambulatory Visit: Payer: Self-pay

## 2013-04-01 ENCOUNTER — Telehealth: Payer: Self-pay | Admitting: Cardiovascular Disease

## 2013-04-01 MED ORDER — DILTIAZEM HCL ER COATED BEADS 360 MG PO CP24: 360.0000 mg | ORAL_CAPSULE | Freq: Every morning | ORAL | Status: DC

## 2013-04-01 NOTE — Telephone Encounter (Signed)
Need refill on his Diltiazem 360 mg #30.

## 2013-04-01 NOTE — Telephone Encounter (Signed)
Pt needs diltiazem filled for #90 to [facility] [address]

## 2013-04-01 NOTE — Telephone Encounter (Signed)
Refilled in [place] office.

## 2013-04-16 ENCOUNTER — Encounter: Payer: Self-pay | Admitting: Gastroenterology

## 2013-04-16 ENCOUNTER — Ambulatory Visit (INDEPENDENT_AMBULATORY_CARE_PROVIDER_SITE_OTHER): Payer: Medicare Other | Admitting: Gastroenterology

## 2013-04-16 VITALS — BP 132/75 | HR 60 | Temp 98.1°F | Wt 268.2 lb

## 2013-04-16 DIAGNOSIS — K7460 Unspecified cirrhosis of liver: Secondary | ICD-10-CM

## 2013-04-16 MED ORDER — FUROSEMIDE 20 MG PO TABS: 40.0000 mg | ORAL_TABLET | Freq: Every morning | ORAL | Status: DC

## 2013-04-16 MED ORDER — SPIRONOLACTONE 50 MG PO TABS: 100.0000 mg | ORAL_TABLET | Freq: Every day | ORAL | Status: DC

## 2013-04-16 MED ORDER — ONDANSETRON HCL 4 MG PO TABS: 4.0000 mg | ORAL_TABLET | Freq: Three times a day (TID) | ORAL | Status: DC | PRN

## 2013-04-16 NOTE — Assessment & Plan Note (Signed)
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 Would recommend serial monitoring of transaminases; if any bumps occur, reconsider possibility. [place] up-to-date, with next due in April 2015. EGD on file from Nov 2014. Lower extremity edema noted with evidence of tense ascites. Needs increase in diuretics but will check BMP first.  Also of note, continues to report upper abdominal discomfort, "tightness", nausea without vomiting. Majority of day is nauseated with worsening in mornings. With history of diabetes, query underlying gastroparesis. Abdominal discomfort etiology unclear: it is assuring an EGD is on file. Recommend CT abd/pelvis to further sort out.   CT abd/pelvis Check BMP Increase Lasix to 40, Aldactone to 100 daily Add Zofran for nausea Consider GES if no improvement in nausea Return in 3 months

## 2013-04-16 NOTE — Progress Notes (Signed)
Referring Provider: Karleen Hampshire, MD Primary Care Physician:  [name], MD Primary GI: Dr. Jena Gauss   Chief Complaint  Patient presents with  . Follow-up    doing good    HPI:   Ralph [name] presents today in follow-up with history of likely NASH cirrhosis. Thorough work-up completed. Although weakly positive ASMA, immunoglobulins unimpressive and transaminases have been normal. Discussed with Dr. Jena Gauss; hesitant to call this an autoimmune etiology unless spikes in transaminases occur.   EGD on file from Nov 2014 with Grade 1 esophageal varices, empiric dilation. Fatty liver on recent US in Oct 2014. Due for [place] of abdomen in April 2015. Next colonoscopy 2016.   Notes pain in upper abdomen with eating, "tight" and "stiff", bloated. Feels bloated but it's not. Notes nausea. No vomiting. Nauseated 70% of the time. Wakes up in the morning nauseated. Feels bloated with eating. Small snacks do not affect. Lasix 20 mg and Aldactone 50 mg.   Past Medical History  Diagnosis Date  . Hypertension   . Cirrhosis     Hep A and B immune  . Depressed   . Varicose vein     of esophagus  . Difficult intubation     "trouble waking up afterwards" (01/06/2012)  . CHF (congestive heart failure) 01/06/2012  . Sinus pause 01/06/2012    5.2 seconds  . Anginal pain   . Sleep apnea     "don't wear mask" (01/06/2012)  . Emphysema   . Type II diabetes mellitus   . GERD (gastroesophageal reflux disease)   . H/O hiatal hernia   . Coughing up blood     "comes from my throat" (01/06/2012)  . Arthritis     "back; fingers" (01/06/2012)  . Chronic lower back pain   . Fatty liver disease, nonalcoholic   . CHB (complete heart block)   . Presence of permanent cardiac pacemaker 9/292013    [facility]    Past Surgical History  Procedure Laterality Date  . Back surgery    . Spinal cord stimulator implant  2006  . Cholecystectomy  1993  . Nasal septum surgery  1992  . Tonsillectomy and  adenoidectomy  1992  . Posterior fusion lumbar spine  1999    L4-5  . Lumbar disc surgery  1994; ~ 1995; ~ 1996  . Warthin's tumor excision  1990's    right  . Permanent pacemaker insertion  01/08/2012    CHB  . US echocardiography  12/28/2011    mild LVH,mild mitral annulara ca+,mild MR  . Nuclear stress test  10/19/2004    No ischemia  . Esophagogastroduodenoscopy  11/08/2004    [id number] esophageal erosions consistent with erosive reflux esophagitis/Areas of hemorrhage and nodularity of the fundal mucosa of uncertain significance, biopsied.  Small hiatal hernia, otherwise normal stomach  . Colonoscopy  11/08/2004    TGP:QDIYME rectum, colon, TI.  Marland Kitchen Esophagogastroduodenoscopy  2010    Dr. Jena Gauss: 3 columns Grade 1 varices, erosive esophagitis, HH, portal gastropathy, normal D1, D2  . Esophagogastroduodenoscopy (egd) with esophageal dilation N/A 02/14/2013    BRA:XENMM 1 esophageal varices. Abnormal distal esophagus/status post biopsy after Maloney dilation. Portal gastropathy. Antral erosions-status post biopsy. path negative for H.pylori, benign path.    Current Outpatient Prescriptions  Medication Sig Dispense Refill  . albuterol (PROVENTIL HFA;VENTOLIN HFA) 108 (90 BASE) MCG/ACT inhaler Inhale 2 puffs into the lungs every 6 (six) hours as needed for wheezing.      Marland Kitchen aspirin EC 81 MG  tablet Take 81 mg by mouth daily.      Marland Kitchen buPROPion (WELLBUTRIN SR) 150 MG 12 hr tablet Take 150 mg by mouth 2 (two) times daily.      . diazepam (VALIUM) 10 MG tablet Take 10 mg by mouth every 6 (six) hours as needed for anxiety.      Marland Kitchen diltiazem (CARDIZEM CD) 360 MG 24 hr capsule Take 1 capsule (360 mg total) by mouth every morning.  90 capsule  6  . furosemide (LASIX) 20 MG tablet Take 20 mg by mouth every morning.      Marland Kitchen HYDROcodone-acetaminophen (NORCO) 10-325 MG per tablet Take 1 tablet by mouth every 6 (six) hours as needed for pain.      Marland Kitchen insulin glargine (LANTUS) 100 units/mL SOLN Inject 120 Units  into the skin daily. Pt uses 60 units in am and 60 units in pm      . insulin NPH Human (HUMULIN N,NOVOLIN N) 100 UNIT/ML injection Inject 5 Units into the skin 2 (two) times daily before a meal.      . metFORMIN (GLUCOPHAGE) 850 MG tablet Take 850 mg by mouth 2 (two) times daily.      . metoprolol tartrate (LOPRESSOR) 25 MG tablet Take 1 tablet (25 mg total) by mouth 2 (two) times daily.  60 tablet  11  . mometasone-formoterol (DULERA) 200-5 MCG/ACT AERO Inhale 2 puffs into the lungs 2 (two) times daily.      . pantoprazole (PROTONIX) 40 MG tablet Take 40 mg by mouth every morning.      . rivaroxaban (XARELTO) 20 MG TABS tablet Take 1 tablet (20 mg total) by mouth daily with supper.  30 tablet  0  . simvastatin (ZOCOR) 20 MG tablet Take 20 mg by mouth every evening.      Marland Kitchen spironolactone (ALDACTONE) 50 MG tablet Take 1 tablet (50 mg total) by mouth daily.  30 tablet  3  . fluticasone (FLONASE) 50 MCG/ACT nasal spray Place 1 spray into the nose daily.  16 g  0   No current facility-administered medications for this visit.    Allergies as of 04/16/2013 - Review Complete 04/16/2013  Allergen Reaction Noted  . Nitroglycerin Hives, Swelling, and Rash     Family History  Problem Relation Age of Onset  . Diabetes Neg Hx   . Colon cancer Neg Hx     History   Social History  . Marital Status: Married    Spouse Name: N/A    Number of Children: N/A  . Years of Education: N/A   Social History Main Topics  . Smoking status: Former Smoker -- 45 years    Types: Cigarettes  . Smokeless tobacco: Former Systems developer    Quit date: 11/13/2012     Comment: using Welbutrin and e-cigarettes-stopped smoking 12-03-12  . Alcohol Use: No     Comment: "quit alcohol 2011"  . Drug Use: No  . Sexual Activity: Not Currently   Other Topics Concern  . None   Social History Narrative  . None    Review of Systems: As mentioned in HPI.   Physical Exam: BP 132/75  Pulse 60  Temp(Src) 98.1 F (36.7 C)  (Oral)  Wt 268 lb 3.2 oz (121.655 kg) General:   Alert and oriented. No distress noted. Pleasant and cooperative. 2 liters O2 nasal cannula Head:  Normocephalic and atraumatic. Eyes:  Conjuctiva clear without scleral icterus. Mouth:  Oral mucosa pink and moist. Good dentition. No lesions. Heart:  S1, S2 present Abdomen:  +BS, soft, protuberant but non-tense. No ascites appreciated.  Extremities:  2+ edema bilateral lower extremities to knee Neurologic:  Alert and  oriented x4;  grossly normal neurologically. Skin:  Intact without significant lesions or rashes. Psych:  Alert and cooperative. Normal mood and affect.  Lab Results  Component Value Date   WBC 8.9 01/25/2013   HGB 13.9 01/25/2013   HCT 41.2 01/25/2013   MCV 90.0 01/25/2013   PLT 285 01/25/2013   Lab Results  Component Value Date   ALT 56* 01/25/2013   AST 29 01/25/2013   GGT 575* 12/17/2012   ALKPHOS 286* 01/25/2013   BILITOT 0.5 01/25/2013

## 2013-04-16 NOTE — Patient Instructions (Signed)
Please have blood work done today. I will send a message in MyChart to let you know it's ok to increase your diuretics.   You will take Lasix 40 mg each morning with Aldactone 100 mg each morning. Continue to monitor your sodium intake.   I have ordered a CT scan of your belly due to continued discomfort. It is important that you do not take Glucophage for 48 hours after the CT scan to help protect your kidneys.   I have sent in a nausea medication to your pharmacy. Take this as needed. Monitor for constipation. If nausea continues, we will need to investigate it further.  We will see you back in 3 months!

## 2013-04-17 ENCOUNTER — Encounter: Payer: Self-pay | Admitting: Gastroenterology

## 2013-04-17 LAB — BASIC METABOLIC PANEL
BUN: 28 mg/dL — AB (ref 6–23)
CO2: 24 mEq/L (ref 19–32)
CREATININE: 1.23 mg/dL (ref 0.50–1.35)
Calcium: 9 mg/dL (ref 8.4–10.5)
Chloride: 101 mEq/L (ref 96–112)
Glucose, Bld: 98 mg/dL (ref 70–99)
Potassium: 4.5 mEq/L (ref 3.5–5.3)
Sodium: 135 mEq/L (ref 135–145)

## 2013-04-17 NOTE — Progress Notes (Signed)
cc'd to pcp 

## 2013-04-17 NOTE — Progress Notes (Signed)
Quick Note:  Increase Lasix to 40 mg per day and Aldactone 100 mg per day. I would like you to repeat this blood work in about 7-10 days to make sure we are on the right track. ______

## 2013-04-18 ENCOUNTER — Other Ambulatory Visit: Payer: Self-pay | Admitting: Gastroenterology

## 2013-04-18 ENCOUNTER — Other Ambulatory Visit: Payer: Self-pay

## 2013-04-18 DIAGNOSIS — K7460 Unspecified cirrhosis of liver: Secondary | ICD-10-CM

## 2013-04-19 ENCOUNTER — Ambulatory Visit (HOSPITAL_COMMUNITY)
Admission: RE | Admit: 2013-04-19 | Discharge: 2013-04-19 | Disposition: A | Discharge status: Home / Self Care | Payer: Medicare Other | Source: Ambulatory Visit | Attending: Gastroenterology | Admitting: Gastroenterology

## 2013-04-19 ENCOUNTER — Encounter (HOSPITAL_COMMUNITY): Payer: Self-pay

## 2013-04-19 ENCOUNTER — Encounter: Payer: Self-pay | Admitting: Gastroenterology

## 2013-04-19 ENCOUNTER — Other Ambulatory Visit: Payer: Self-pay | Admitting: Gastroenterology

## 2013-04-19 DIAGNOSIS — K7689 Other specified diseases of liver: Secondary | ICD-10-CM | POA: Insufficient documentation

## 2013-04-19 DIAGNOSIS — K7460 Unspecified cirrhosis of liver: Secondary | ICD-10-CM

## 2013-04-19 DIAGNOSIS — R109 Unspecified abdominal pain: Secondary | ICD-10-CM | POA: Insufficient documentation

## 2013-04-19 DIAGNOSIS — K4090 Unilateral inguinal hernia, without obstruction or gangrene, not specified as recurrent: Secondary | ICD-10-CM | POA: Insufficient documentation

## 2013-04-19 MED ORDER — IOHEXOL 300 MG/ML  SOLN
100.0000 mL | Freq: Once | INTRAMUSCULAR | Status: AC | PRN
  Administered 2013-04-19: 100 mL via INTRAVENOUS

## 2013-04-23 ENCOUNTER — Other Ambulatory Visit: Payer: Self-pay | Admitting: Gastroenterology

## 2013-04-23 ENCOUNTER — Telehealth: Payer: Self-pay | Admitting: Gastroenterology

## 2013-04-23 DIAGNOSIS — R7309 Other abnormal glucose: Secondary | ICD-10-CM

## 2013-04-23 NOTE — Telephone Encounter (Signed)
Patient has requested an endocrinology referral due to labile blood sugars.   Can we refer him to [name] for diabetes management? Thanks! Ralph [name]

## 2013-04-23 NOTE — Telephone Encounter (Signed)
Referral has been faxed to [name]

## 2013-04-25 ENCOUNTER — Encounter: Payer: Self-pay | Admitting: Gastroenterology

## 2013-04-26 LAB — BASIC METABOLIC PANEL
BUN: 34 mg/dL — ABNORMAL HIGH (ref 6–23)
CO2: 27 mEq/L (ref 19–32)
Calcium: 9.7 mg/dL (ref 8.4–10.5)
Chloride: 100 mEq/L (ref 96–112)
Creat: 1.32 mg/dL (ref 0.50–1.35)
Glucose, Bld: 180 mg/dL — ABNORMAL HIGH (ref 70–99)
POTASSIUM: 4.7 meq/L (ref 3.5–5.3)
SODIUM: 138 meq/L (ref 135–145)

## 2013-04-30 NOTE — Progress Notes (Signed)
Quick Note:  CT did not show any abnormalities to account for his symptoms. This is a good thing. ______

## 2013-05-01 ENCOUNTER — Other Ambulatory Visit: Payer: Self-pay | Admitting: Gastroenterology

## 2013-05-01 ENCOUNTER — Other Ambulatory Visit: Payer: Self-pay

## 2013-05-01 DIAGNOSIS — R7989 Other specified abnormal findings of blood chemistry: Secondary | ICD-10-CM

## 2013-05-19 NOTE — Progress Notes (Signed)
Clinical Summary Mr. Ferrelli is a 63 y.o.male seen today for follow up of the following medical problems.   1. History of sinus arrest  - St Jude dual chamber pacemaker pacemaker implanted Sept 2013 ([place] [address] RF).  - normal pacemaking function per [facility] Notes 11/2012  - has follow up with pacemaker clinic later this week.   2. Afib  - started on multaq 400mg  bid during last visit to [facility] 11/30/12. Felt very shaky and nauseous with increased SOB, stopped on his own and symptoms resolved. Medication has not been restarted, managed with metoprolol and dilitiazem which at last visit was controlling his symptoms well - remains on xarelto   - since last visit having palpitations typically once a week, approx 15 min to 2 hours. + SOB, + lightheadedness and dizziness.  - Denies any significant bleeding or bruising on xarelto  4. HTN  - compliant w/ meds  - checks bp at home once daily, typically 120s/80s   5. 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   7. COPD - compliant with inhalers and home oxygen.  Past Medical History  Diagnosis Date  . Hypertension   . Cirrhosis     Hep A and B immune  . Depressed   . Varicose vein     of esophagus  . Difficult intubation     "trouble waking up afterwards" (01/06/2012)  . CHF (congestive heart failure) 01/06/2012  . Sinus pause 01/06/2012    5.2 seconds  . Anginal pain   . Sleep apnea     "don't wear mask" (01/06/2012)  . Emphysema   . Type II diabetes mellitus   . GERD (gastroesophageal reflux disease)   . H/O hiatal hernia   . Coughing up blood     "comes from my throat" (01/06/2012)  . Arthritis     "back; fingers" (01/06/2012)  . Chronic lower back pain   . Fatty liver disease, nonalcoholic   . CHB (complete heart  block)   . Presence of permanent cardiac pacemaker 9/292013    St.Jude     Allergies  Allergen Reactions  . Nitroglycerin Hives, Swelling and Rash     Current Outpatient Prescriptions  Medication Sig Dispense Refill  . albuterol (PROVENTIL HFA;VENTOLIN HFA) 108 (90 BASE) MCG/ACT inhaler Inhale 2 puffs into the lungs every 6 (six) hours as needed for wheezing.      Marland Kitchen aspirin EC 81 MG tablet Take 81 mg by mouth daily.      Marland Kitchen buPROPion (WELLBUTRIN SR) 150 MG 12 hr tablet Take 150 mg by mouth 2 (two) times daily.      . diazepam (VALIUM) 10 MG tablet Take 10 mg by mouth every 6 (six) hours as needed for anxiety.      Marland Kitchen diltiazem (CARDIZEM CD) 360 MG 24 hr capsule Take 1 capsule (360 mg total) by mouth every morning.  90 capsule  6  . fluticasone (FLONASE) 50 MCG/ACT nasal spray Place 1 spray into the nose daily.  16 g  0  . furosemide (LASIX) 20 MG tablet Take 2 tablets (40 mg total) by mouth every morning.  180 tablet  3  . HYDROcodone-acetaminophen (NORCO) 10-325 MG per tablet Take 1 tablet by mouth every 6 (six)  hours as needed for pain.      Marland Kitchen insulin glargine (LANTUS) 100 units/mL SOLN Inject 120 Units into the skin daily. Pt uses 60 units in am and 60 units in pm      . insulin NPH Human (HUMULIN N,NOVOLIN N) 100 UNIT/ML injection Inject 5 Units into the skin 2 (two) times daily before a meal.      . metFORMIN (GLUCOPHAGE) 850 MG tablet Take 850 mg by mouth 2 (two) times daily.      . metoprolol tartrate (LOPRESSOR) 25 MG tablet Take 1 tablet (25 mg total) by mouth 2 (two) times daily.  60 tablet  11  . mometasone-formoterol (DULERA) 200-5 MCG/ACT AERO Inhale 2 puffs into the lungs 2 (two) times daily.      . ondansetron (ZOFRAN) 4 MG tablet Take 1 tablet (4 mg total) by mouth every 8 (eight) hours as needed for nausea or vomiting.  40 tablet  1  . pantoprazole (PROTONIX) 40 MG tablet Take 40 mg by mouth every morning.      . rivaroxaban (XARELTO) 20 MG TABS tablet Take 1 tablet (20 mg  total) by mouth daily with supper.  30 tablet  0  . simvastatin (ZOCOR) 20 MG tablet Take 20 mg by mouth every evening.      Marland Kitchen spironolactone (ALDACTONE) 50 MG tablet Take 2 tablets (100 mg total) by mouth daily.  180 tablet  3   No current facility-administered medications for this visit.     Past Surgical History  Procedure Laterality Date  . Back surgery    . Spinal cord stimulator implant  2006  . Cholecystectomy  1993  . Nasal septum surgery  1992  . Tonsillectomy and adenoidectomy  1992  . Posterior fusion lumbar spine  1999    L4-5  . Lumbar disc surgery  1994; ~ 1995; ~ 1996  . Warthin's tumor excision  1990's    right  . Permanent pacemaker insertion  01/08/2012    CHB  . US echocardiography  12/28/2011    mild LVH,mild mitral annulara ca+,mild MR  . Nuclear stress test  10/19/2004    No ischemia  . Esophagogastroduodenoscopy  11/08/2004    [id number] esophageal erosions consistent with erosive reflux esophagitis/Areas of hemorrhage and nodularity of the fundal mucosa of uncertain significance, biopsied.  Small hiatal hernia, otherwise normal stomach  . Colonoscopy  11/08/2004    [id number] rectum, colon, TI.  Marland Kitchen Esophagogastroduodenoscopy  2010    Dr. Gala Romney: 3 columns Grade 1 varices, erosive esophagitis, HH, portal gastropathy, normal D1, D2  . Esophagogastroduodenoscopy (egd) with esophageal dilation N/A 02/14/2013    [id number] esophageal varices. Abnormal distal esophagus/status post biopsy after Maloney dilation. Portal gastropathy. Antral erosions-status post biopsy. path negative for H.pylori, benign path.     Allergies  Allergen Reactions  . Nitroglycerin Hives, Swelling and Rash      Family History  Problem Relation Age of Onset  . Diabetes Neg Hx   . Colon cancer Neg Hx      Social History Mr. Hepburn reports that he has quit smoking. His smoking use included Cigarettes. He smoked 0.00 packs per day for 45 years. He quit smokeless tobacco use about 6  months ago. Mr. Madey reports that he does not drink alcohol.   Review of Systems CONSTITUTIONAL: No weight loss, fever, chills, weakness or fatigue.  HEENT: Eyes: No visual loss, blurred vision, double vision or yellow sclerae.No hearing loss, sneezing, congestion, runny nose or sore throat.  SKIN: No rash or itching.  CARDIOVASCULAR: per HPI RESPIRATORY: SOB GASTROINTESTINAL: No anorexia, nausea, vomiting or diarrhea. No abdominal pain or blood.  GENITOURINARY: No burning on urination, no polyuria NEUROLOGICAL: No headache, dizziness, syncope, paralysis, ataxia, numbness or tingling in the extremities. No change in bowel or bladder control.  MUSCULOSKELETAL: No muscle, back pain, joint pain or stiffness.  LYMPHATICS: No enlarged nodes. No history of splenectomy.  PSYCHIATRIC: No history of depression or anxiety.  ENDOCRINOLOGIC: No reports of sweating, cold or heat intolerance. No polyuria or polydipsia.  Marland Kitchen   Physical Examination p 61 bp 136/53 Wt 242 lbs BMI 35 Gen: resting comfortably, no acute distress HEENT: no scleral icterus, pupils equal round and reactive, no palptable cervical adenopathy,  CV: RRR, no m/r/g, no JVD, no carotid bruits Resp: Clear to auscultation bilaterally GI: abdomen is soft, non-tender, non-distended, normal bowel sounds, no hepatosplenomegaly MSK: extremities are warm, trace bilateral edema Skin: warm, no rash Neuro:  no focal deficits Psych: appropriate affect   Diagnostic Studies Jan 2014 Myoview: no ischemia   11/2012 Echo: LVEF 60-65%, mild LVH, moderate basal septal hypertrophy, mild LAE     Assessment and Plan  1. Afib  - symptoms approx once a week, lasting 59min to 1 hours, fairly intense in description - will increase metoprolol to 50mg  bid, he has COPD however beta 1 selective beta blockers are not an absolute contraindication, we will follow his symptoms with titration. - pending results, he may need to be considered for rhythm  control strategy, he has significant side effects when tried on dronederone previously. Will consider EP assistance for antiarrhythmic therapy if intensified rate control strategy does not control symptoms. He has pacer so can be aggressive with rate control - continue xarelto, history of liver disease with grade I varices with no evidence of bleeding on last EGD 03/2013. Will stop ASA, check CBC. Continue xarelto for now    2. HTN:  - at goal,continue current meds  3. HL  - followed by PCP. Reports PCP is watching his LFTs closely due to an increase and history of NASH, will defer management to PCP  4. Sinus arrest - he has follow up with pacemaker clinic later this week.       Arnoldo Lenis, [name], F.A.C.C.

## 2013-05-20 ENCOUNTER — Encounter: Payer: Self-pay | Admitting: Cardiology

## 2013-05-20 ENCOUNTER — Ambulatory Visit (INDEPENDENT_AMBULATORY_CARE_PROVIDER_SITE_OTHER): Payer: Medicare Other | Admitting: Cardiology

## 2013-05-20 VITALS — BP 136/53 | HR 61 | Ht 70.0 in | Wt 242.0 lb

## 2013-05-20 DIAGNOSIS — D619 Aplastic anemia, unspecified: Secondary | ICD-10-CM

## 2013-05-20 DIAGNOSIS — I4891 Unspecified atrial fibrillation: Secondary | ICD-10-CM

## 2013-05-20 DIAGNOSIS — E785 Hyperlipidemia, unspecified: Secondary | ICD-10-CM

## 2013-05-20 DIAGNOSIS — I10 Essential (primary) hypertension: Secondary | ICD-10-CM

## 2013-05-20 LAB — CBC
HEMATOCRIT: 42.4 % (ref 39.0–52.0)
Hemoglobin: 14.6 g/dL (ref 13.0–17.0)
MCH: 31.3 pg (ref 26.0–34.0)
MCHC: 34.4 g/dL (ref 30.0–36.0)
MCV: 91 fL (ref 78.0–100.0)
PLATELETS: 300 10*3/uL (ref 150–400)
RBC: 4.66 MIL/uL (ref 4.22–5.81)
RDW: 14.6 % (ref 11.5–15.5)
WBC: 9.9 10*3/uL (ref 4.0–10.5)

## 2013-05-20 MED ORDER — METOPROLOL TARTRATE 50 MG PO TABS
50.0000 mg | ORAL_TABLET | Freq: Two times a day (BID) | ORAL | Status: DC
Start: 2013-05-20 — End: 2013-08-26

## 2013-05-20 NOTE — Patient Instructions (Signed)
Your physician recommends that you schedule a follow-up appointment in: 3 months with Dr Harl Bowie  Your physician recommends that you return for lab work today. CBC  Your physician has recommended you make the following change in your medication:  STOP ASPIRIN Increase metoprolol to 50 mg twice a day.

## 2013-05-22 ENCOUNTER — Ambulatory Visit (INDEPENDENT_AMBULATORY_CARE_PROVIDER_SITE_OTHER): Payer: Medicare Other | Admitting: Internal Medicine

## 2013-05-22 ENCOUNTER — Encounter: Payer: Self-pay | Admitting: Internal Medicine

## 2013-05-22 DIAGNOSIS — R55 Syncope and collapse: Secondary | ICD-10-CM

## 2013-05-22 DIAGNOSIS — E669 Obesity, unspecified: Secondary | ICD-10-CM

## 2013-05-22 DIAGNOSIS — Z950 Presence of cardiac pacemaker: Secondary | ICD-10-CM

## 2013-05-22 DIAGNOSIS — I4891 Unspecified atrial fibrillation: Secondary | ICD-10-CM

## 2013-05-22 LAB — MDC_IDC_ENUM_SESS_TYPE_INCLINIC
Battery Remaining Longevity: 100.8 mo
Battery Voltage: 2.95 V
Brady Statistic RA Percent Paced: 55 %
Date Time Interrogation Session: 20150211085326
Implantable Pulse Generator Model: 2210
Implantable Pulse Generator Serial Number: 7393982
Lead Channel Impedance Value: 350 Ohm
Lead Channel Pacing Threshold Amplitude: 0.625 V
Lead Channel Pacing Threshold Amplitude: 0.625 V
Lead Channel Pacing Threshold Pulse Width: 0.4 ms
Lead Channel Pacing Threshold Pulse Width: 0.4 ms
Lead Channel Setting Pacing Amplitude: 0.875
Lead Channel Setting Pacing Pulse Width: 0.4 ms
MDC IDC MSMT LEADCHNL RA SENSING INTR AMPL: 4.1 mV
MDC IDC MSMT LEADCHNL RV IMPEDANCE VALUE: 512.5 Ohm
MDC IDC MSMT LEADCHNL RV SENSING INTR AMPL: 4.3 mV
MDC IDC SET LEADCHNL RA PACING AMPLITUDE: 1.625
MDC IDC SET LEADCHNL RV SENSING SENSITIVITY: 2 mV
MDC IDC STAT BRADY RV PERCENT PACED: 0.13 %

## 2013-05-22 NOTE — Assessment & Plan Note (Signed)
PPM interogation demonstrates that he is in NSR over 99% of the time. No change in medical therapy.

## 2013-05-22 NOTE — Progress Notes (Signed)
HPI Ralph [name] is referred today for evaluation and  Management of atrial fibrillation and his PPM. He is a pleasant 63 yo man with a h/o PAF, who was found to have profound bradycardia on cardiac monitoring. He was tried on Multaq but could not tolerate this medication. He has rare palpitations. He denies chest pain. He has severe COPD and is on home oxygen. He has minimal cough. No hemoptysis. He has documented desaturation with exertion when he is not on oxygen.  Allergies  Allergen Reactions  . Nitroglycerin Hives, Swelling and Rash     Current Outpatient Prescriptions  Medication Sig Dispense Refill  . albuterol (PROVENTIL HFA;VENTOLIN HFA) 108 (90 BASE) MCG/ACT inhaler Inhale 2 puffs into the lungs every 6 (six) hours as needed for wheezing.      Marland Kitchen buPROPion (WELLBUTRIN SR) 150 MG 12 hr tablet Take 150 mg by mouth 2 (two) times daily.      . diazepam (VALIUM) 10 MG tablet Take 10 mg by mouth every 6 (six) hours as needed for anxiety.      Marland Kitchen diltiazem (CARDIZEM CD) 360 MG 24 hr capsule Take 1 capsule (360 mg total) by mouth every morning.  90 capsule  6  . fluticasone (FLONASE) 50 MCG/ACT nasal spray Place 1 spray into the nose daily.  16 g  0  . furosemide (LASIX) 20 MG tablet Take 2 tablets (40 mg total) by mouth every morning.  180 tablet  3  . HYDROcodone-acetaminophen (NORCO) 10-325 MG per tablet Take 1 tablet by mouth every 6 (six) hours as needed for pain.      Marland Kitchen insulin glargine (LANTUS) 100 units/mL SOLN Inject 120 Units into the skin daily. Pt uses 60 units in am and 60 units in pm      . insulin NPH Human (HUMULIN N,NOVOLIN N) 100 UNIT/ML injection Inject 5 Units into the skin 2 (two) times daily before a meal.      . metFORMIN (GLUCOPHAGE) 850 MG tablet Take 850 mg by mouth 2 (two) times daily.      . metoprolol tartrate (LOPRESSOR) 50 MG tablet Take 1 tablet (50 mg total) by mouth 2 (two) times daily.  60 tablet  11  . mometasone-formoterol (DULERA) 200-5 MCG/ACT AERO  Inhale 2 puffs into the lungs 2 (two) times daily.      . ondansetron (ZOFRAN) 4 MG tablet Take 1 tablet (4 mg total) by mouth every 8 (eight) hours as needed for nausea or vomiting.  40 tablet  1  . pantoprazole (PROTONIX) 40 MG tablet Take 40 mg by mouth every morning.      . rivaroxaban (XARELTO) 20 MG TABS tablet Take 1 tablet (20 mg total) by mouth daily with supper.  30 tablet  0  . simvastatin (ZOCOR) 20 MG tablet Take 20 mg by mouth every evening.      Marland Kitchen spironolactone (ALDACTONE) 50 MG tablet Take 2 tablets (100 mg total) by mouth daily.  180 tablet  3   No current facility-administered medications for this visit.     Past Medical History  Diagnosis Date  . Hypertension   . Cirrhosis     Hep A and B immune  . Depressed   . Varicose vein     of esophagus  . Difficult intubation     "trouble waking up afterwards" (01/06/2012)  . CHF (congestive heart failure) 01/06/2012  . Sinus pause 01/06/2012    5.2 seconds  . Anginal pain   .  Sleep apnea     "don't wear mask" (01/06/2012)  . Emphysema   . Type II diabetes mellitus   . GERD (gastroesophageal reflux disease)   . H/O hiatal hernia   . Coughing up blood     "comes from my throat" (01/06/2012)  . Arthritis     "back; fingers" (01/06/2012)  . Chronic lower back pain   . Fatty liver disease, nonalcoholic   . CHB (complete heart block)   . Presence of permanent cardiac pacemaker 9/292013    St.Jude    ROS:   All systems reviewed and negative except as noted in the HPI.   Past Surgical History  Procedure Laterality Date  . Back surgery    . Spinal cord stimulator implant  2006  . Cholecystectomy  1993  . Nasal septum surgery  1992  . Tonsillectomy and adenoidectomy  1992  . Posterior fusion lumbar spine  1999    L4-5  . Lumbar disc surgery  1994; ~ 1995; ~ 1996  . Warthin's tumor excision  1990's    right  . Permanent pacemaker insertion  01/08/2012    CHB  . US echocardiography  12/28/2011    mild LVH,mild  mitral annulara ca+,mild MR  . Nuclear stress test  10/19/2004    No ischemia  . Esophagogastroduodenoscopy  11/08/2004    [id number] esophageal erosions consistent with erosive reflux esophagitis/Areas of hemorrhage and nodularity of the fundal mucosa of uncertain significance, biopsied.  Small hiatal hernia, otherwise normal stomach  . Colonoscopy  11/08/2004    [id number] rectum, colon, TI.  Marland Kitchen Esophagogastroduodenoscopy  2010    Dr. Gala Romney: 3 columns Grade 1 varices, erosive esophagitis, HH, portal gastropathy, normal D1, D2  . Esophagogastroduodenoscopy (egd) with esophageal dilation N/A 02/14/2013    [id number] 1 esophageal varices. Abnormal distal esophagus/status post biopsy after Maloney dilation. Portal gastropathy. Antral erosions-status post biopsy. path negative for H.pylori, benign path.     Family History  Problem Relation Age of Onset  . Diabetes Neg Hx   . Colon cancer Neg Hx      History   Social History  . Marital Status: Married    Spouse Name: N/A    Number of Children: N/A  . Years of Education: N/A   Occupational History  . Not on file.   Social History Main Topics  . Smoking status: Former Smoker -- 45 years    Types: Cigarettes  . Smokeless tobacco: Former Systems developer    Quit date: 11/13/2012     Comment: using Welbutrin and e-cigarettes-stopped smoking 12-03-12  . Alcohol Use: No     Comment: "quit alcohol 2011"  . Drug Use: No  . Sexual Activity: Not Currently   Other Topics Concern  . Not on file   Social History Narrative  . No narrative on file    BP 144/43, P - 60, R - 18  Physical Exam:  stable appearing middle aged man, wearing oxygen by nasal cannula, NAD HEENT: Unremarkable Neck:  No JVD, no thyromegally Back:  No CVA tenderness Lungs:  Clear with no wheezes, rales, or rhonchi HEART:  Regular rate rhythm, no murmurs, no rubs, no clicks Abd:  soft, positive bowel sounds, no organomegally, no rebound, no guarding Ext:  2 plus pulses, no  edema, no cyanosis, no clubbing Skin:  No rashes no nodules Neuro:  CN II through XII intact, motor grossly intact   DEVICE  Normal device function.  See PaceArt for details.   Assess/Plan:

## 2013-05-22 NOTE — Assessment & Plan Note (Signed)
His [employer] Jude DDD PM is working normally. Will recheck in several months. 

## 2013-05-22 NOTE — Assessment & Plan Note (Signed)
Since his PPM was placed, no recurrent syncope.

## 2013-05-22 NOTE — Patient Instructions (Signed)
Your physician recommends that you schedule a follow-up appointment in: 12 months with Dr Taylor You will receive a reminder letter two months in advance reminding you to call and schedule your appointment. If you don't receive this letter, please contact our office.  Remote monitoring is used to monitor your Pacemaker or ICD from home. This monitoring reduces the number of office visits required to check your device to one time per year. It allows us to keep an eye on the functioning of your device to ensure it is working properly. You are scheduled for a device check from home on May 18 th. You may send your transmission at any time that day. If you have a wireless device, the transmission will be sent automatically. After your physician reviews your transmission, you will receive a postcard with your next transmission date. 

## 2013-05-22 NOTE — Assessment & Plan Note (Signed)
I have strongly encouraged the patient to lose weight.  

## 2013-05-23 ENCOUNTER — Encounter: Payer: Self-pay | Admitting: *Deleted

## 2013-05-29 ENCOUNTER — Encounter: Payer: Medicare Other | Admitting: Internal Medicine

## 2013-06-06 ENCOUNTER — Emergency Department (HOSPITAL_COMMUNITY): Payer: Medicare Other

## 2013-06-06 ENCOUNTER — Observation Stay (HOSPITAL_COMMUNITY)
Admission: EM | Admit: 2013-06-06 | Discharge: 2013-06-07 | Disposition: A | Discharge status: Home / Self Care | Payer: Medicare Other | Attending: Internal Medicine | Admitting: Internal Medicine

## 2013-06-06 ENCOUNTER — Encounter (HOSPITAL_COMMUNITY): Payer: Self-pay | Admitting: Emergency Medicine

## 2013-06-06 DIAGNOSIS — L0291 Cutaneous abscess, unspecified: Secondary | ICD-10-CM

## 2013-06-06 DIAGNOSIS — I455 Other specified heart block: Secondary | ICD-10-CM

## 2013-06-06 DIAGNOSIS — I209 Angina pectoris, unspecified: Secondary | ICD-10-CM | POA: Insufficient documentation

## 2013-06-06 DIAGNOSIS — G4730 Sleep apnea, unspecified: Secondary | ICD-10-CM | POA: Insufficient documentation

## 2013-06-06 DIAGNOSIS — J449 Chronic obstructive pulmonary disease, unspecified: Secondary | ICD-10-CM | POA: Diagnosis present

## 2013-06-06 DIAGNOSIS — G4734 Idiopathic sleep related nonobstructive alveolar hypoventilation: Secondary | ICD-10-CM | POA: Diagnosis present

## 2013-06-06 DIAGNOSIS — Z950 Presence of cardiac pacemaker: Secondary | ICD-10-CM

## 2013-06-06 DIAGNOSIS — I10 Essential (primary) hypertension: Secondary | ICD-10-CM | POA: Insufficient documentation

## 2013-06-06 DIAGNOSIS — I4892 Unspecified atrial flutter: Secondary | ICD-10-CM

## 2013-06-06 DIAGNOSIS — K7460 Unspecified cirrhosis of liver: Secondary | ICD-10-CM | POA: Insufficient documentation

## 2013-06-06 DIAGNOSIS — Z888 Allergy status to other drugs, medicaments and biological substances status: Secondary | ICD-10-CM | POA: Insufficient documentation

## 2013-06-06 DIAGNOSIS — Z87891 Personal history of nicotine dependence: Secondary | ICD-10-CM | POA: Insufficient documentation

## 2013-06-06 DIAGNOSIS — I8510 Secondary esophageal varices without bleeding: Secondary | ICD-10-CM | POA: Insufficient documentation

## 2013-06-06 DIAGNOSIS — K219 Gastro-esophageal reflux disease without esophagitis: Secondary | ICD-10-CM | POA: Insufficient documentation

## 2013-06-06 DIAGNOSIS — K7689 Other specified diseases of liver: Secondary | ICD-10-CM | POA: Insufficient documentation

## 2013-06-06 DIAGNOSIS — I4891 Unspecified atrial fibrillation: Secondary | ICD-10-CM | POA: Insufficient documentation

## 2013-06-06 DIAGNOSIS — E1165 Type 2 diabetes mellitus with hyperglycemia: Secondary | ICD-10-CM | POA: Diagnosis present

## 2013-06-06 DIAGNOSIS — E669 Obesity, unspecified: Secondary | ICD-10-CM

## 2013-06-06 DIAGNOSIS — M1990 Unspecified osteoarthritis, unspecified site: Secondary | ICD-10-CM | POA: Diagnosis present

## 2013-06-06 DIAGNOSIS — F329 Major depressive disorder, single episode, unspecified: Secondary | ICD-10-CM | POA: Insufficient documentation

## 2013-06-06 DIAGNOSIS — E119 Type 2 diabetes mellitus without complications: Secondary | ICD-10-CM | POA: Insufficient documentation

## 2013-06-06 DIAGNOSIS — L0390 Cellulitis, unspecified: Secondary | ICD-10-CM

## 2013-06-06 DIAGNOSIS — I498 Other specified cardiac arrhythmias: Principal | ICD-10-CM | POA: Insufficient documentation

## 2013-06-06 DIAGNOSIS — IMO0002 Reserved for concepts with insufficient information to code with codable children: Secondary | ICD-10-CM | POA: Diagnosis present

## 2013-06-06 DIAGNOSIS — F3289 Other specified depressive episodes: Secondary | ICD-10-CM | POA: Insufficient documentation

## 2013-06-06 DIAGNOSIS — I471 Supraventricular tachycardia: Secondary | ICD-10-CM | POA: Diagnosis present

## 2013-06-06 DIAGNOSIS — I509 Heart failure, unspecified: Secondary | ICD-10-CM | POA: Insufficient documentation

## 2013-06-06 LAB — CBC WITH DIFFERENTIAL/PLATELET
Basophils Absolute: 0 10*3/uL (ref 0.0–0.1)
Basophils Relative: 0 % (ref 0–1)
Eosinophils Absolute: 0.9 10*3/uL — ABNORMAL HIGH (ref 0.0–0.7)
Eosinophils Relative: 9 % — ABNORMAL HIGH (ref 0–5)
HCT: 43 % (ref 39.0–52.0)
HEMOGLOBIN: 14.8 g/dL (ref 13.0–17.0)
LYMPHS ABS: 2.5 10*3/uL (ref 0.7–4.0)
LYMPHS PCT: 26 % (ref 12–46)
MCH: 32.2 pg (ref 26.0–34.0)
MCHC: 34.4 g/dL (ref 30.0–36.0)
MCV: 93.7 fL (ref 78.0–100.0)
Monocytes Absolute: 0.7 10*3/uL (ref 0.1–1.0)
Monocytes Relative: 7 % (ref 3–12)
NEUTROS PCT: 58 % (ref 43–77)
Neutro Abs: 5.7 10*3/uL (ref 1.7–7.7)
PLATELETS: 306 10*3/uL (ref 150–400)
RBC: 4.59 MIL/uL (ref 4.22–5.81)
RDW: 13.6 % (ref 11.5–15.5)
WBC: 9.8 10*3/uL (ref 4.0–10.5)

## 2013-06-06 LAB — BASIC METABOLIC PANEL
BUN: 41 mg/dL — ABNORMAL HIGH (ref 6–23)
CALCIUM: 9.6 mg/dL (ref 8.4–10.5)
CO2: 22 meq/L (ref 19–32)
Chloride: 97 mEq/L (ref 96–112)
Creatinine, Ser: 1.08 mg/dL (ref 0.50–1.35)
GFR calc Af Amer: 84 mL/min — ABNORMAL LOW (ref >90)
GFR, EST NON AFRICAN AMERICAN: 73 mL/min — AB (ref >90)
Glucose, Bld: 160 mg/dL — ABNORMAL HIGH (ref 70–99)
POTASSIUM: 3.9 meq/L (ref 3.7–5.3)
Sodium: 138 mEq/L (ref 137–147)

## 2013-06-06 LAB — TROPONIN I

## 2013-06-06 LAB — MAGNESIUM: MAGNESIUM: 2.2 mg/dL (ref 1.5–2.5)

## 2013-06-06 MED ORDER — DILTIAZEM LOAD VIA INFUSION
10.0000 mg | Freq: Once | INTRAVENOUS | Status: AC
  Administered 2013-06-06: 10 mg via INTRAVENOUS
  Filled 2013-06-06: qty 10

## 2013-06-06 MED ORDER — METOPROLOL TARTRATE 50 MG PO TABS
50.0000 mg | ORAL_TABLET | Freq: Once | ORAL | Status: AC
  Administered 2013-06-06: 50 mg via ORAL
  Filled 2013-06-06: qty 1

## 2013-06-06 MED ORDER — DILTIAZEM HCL 100 MG IV SOLR
5.0000 mg/h | INTRAVENOUS | Status: DC
  Administered 2013-06-06: 5 mg/h via INTRAVENOUS
  Filled 2013-06-06: qty 100

## 2013-06-06 NOTE — ED Provider Notes (Signed)
CSN: [id number]     Arrival date & time 06/06/13  1829 History   First MD Initiated Contact with Patient 06/06/13 1845   This chart was scribed for Nat Christen, MD by Terressa Koyanagi, ED Scribe. This patient was seen in room APA10/APA10 and the patient's care was started at 6:55 PM.  Chief Complaint  Patient presents with  . Tachycardia  . Shortness of Breath   The history is provided by the patient. No language interpreter was used.   HPI Comments:  Level 5 caveat for urgent need for intervention Ralph [name] is a 63 y.o. male, with emphysema, T2DM, CHF, Hypertension who presents to the Emergency Department complaining of tachycardia onset approximately 1 hour ago when pt was sitting down. Pt reports that his "heart started racing" while he was sitting down. Pt also complains of associated tightness of the throat and SOB. Pt reports that he was treated for similar symptoms in August of 2014.   Cardiologist- Dr. Harl Bowie [facility])    Past Medical History  Diagnosis Date  . Hypertension   . Cirrhosis     Hep A and B immune  . Depressed   . Varicose vein     of esophagus  . Difficult intubation     "trouble waking up afterwards" (01/06/2012)  . CHF (congestive heart failure) 01/06/2012  . Sinus pause 01/06/2012    5.2 seconds  . Anginal pain   . Sleep apnea     "don't wear mask" (01/06/2012)  . Emphysema   . Type II diabetes mellitus   . GERD (gastroesophageal reflux disease)   . H/O hiatal hernia   . Coughing up blood     "comes from my throat" (01/06/2012)  . Arthritis     "back; fingers" (01/06/2012)  . Chronic lower back pain   . Fatty liver disease, nonalcoholic   . CHB (complete heart block)   . Presence of permanent cardiac pacemaker 9/292013    [employer]   Past Surgical History  Procedure Laterality Date  . Back surgery    . Spinal cord stimulator implant  2006  . Cholecystectomy  1993  . Nasal septum surgery  1992  . Tonsillectomy and adenoidectomy  1992  . Posterior  fusion lumbar spine  1999    L4-5  . Lumbar disc surgery  1994; ~ 1995; ~ 1996  . Warthin's tumor excision  1990's    right  . Permanent pacemaker insertion  01/08/2012    CHB  . US echocardiography  12/28/2011    mild LVH,mild mitral annulara ca+,mild MR  . Nuclear stress test  10/19/2004    No ischemia  . Esophagogastroduodenoscopy  11/08/2004    [id number] esophageal erosions consistent with erosive reflux esophagitis/Areas of hemorrhage and nodularity of the fundal mucosa of uncertain significance, biopsied.  Small hiatal hernia, otherwise normal stomach  . Colonoscopy  11/08/2004    [id number] rectum, colon, TI.  Marland Kitchen Esophagogastroduodenoscopy  2010    Dr. Gala Romney: 3 columns Grade 1 varices, erosive esophagitis, HH, portal gastropathy, normal D1, D2  . Esophagogastroduodenoscopy (egd) with esophageal dilation N/A 02/14/2013    ZTI:WPYKD 1 esophageal varices. Abnormal distal esophagus/status post biopsy after Maloney dilation. Portal gastropathy. Antral erosions-status post biopsy. path negative for H.pylori, benign path.   Family History  Problem Relation Age of Onset  . Diabetes Neg Hx   . Colon cancer Neg Hx    History  Substance Use Topics  . Smoking status: Former Smoker -- 31 years  Types: Cigarettes  . Smokeless tobacco: Former Systems developer    Quit date: 11/13/2012     Comment: using Welbutrin and e-cigarettes-stopped smoking 12-03-12  . Alcohol Use: No     Comment: "quit alcohol 2011"    Review of Systems  HENT:       Tightness around the throat.   Respiratory: Positive for shortness of breath.   Cardiovascular: Positive for palpitations.  All other systems reviewed and are negative.   Allergies  Nitroglycerin  Home Medications   Current Outpatient Rx  Name  Route  Sig  Dispense  Refill  . albuterol (PROVENTIL HFA;VENTOLIN HFA) 108 (90 BASE) MCG/ACT inhaler   Inhalation   Inhale 2 puffs into the lungs every 6 (six) hours as needed for wheezing.         Marland Kitchen  buPROPion (WELLBUTRIN SR) 150 MG 12 hr tablet   Oral   Take 150 mg by mouth 2 (two) times daily.         . diazepam (VALIUM) 10 MG tablet   Oral   Take 10 mg by mouth every 6 (six) hours as needed for anxiety.         Marland Kitchen diltiazem (CARDIZEM CD) 360 MG 24 hr capsule   Oral   Take 1 capsule (360 mg total) by mouth every morning.   90 capsule   6   . fluticasone (FLONASE) 50 MCG/ACT nasal spray   Nasal   Place 1 spray into the nose daily.   16 g   0   . furosemide (LASIX) 20 MG tablet   Oral   Take 2 tablets (40 mg total) by mouth every morning.   180 tablet   3   . HYDROcodone-acetaminophen (NORCO) 10-325 MG per tablet   Oral   Take 1 tablet by mouth every 6 (six) hours as needed for pain.         Marland Kitchen insulin glargine (LANTUS) 100 units/mL SOLN   Subcutaneous   Inject 120 Units into the skin daily. Pt uses 60 units in am and 60 units in pm         . insulin NPH Human (HUMULIN N,NOVOLIN N) 100 UNIT/ML injection   Subcutaneous   Inject 5 Units into the skin 2 (two) times daily before a meal.         . metFORMIN (GLUCOPHAGE) 850 MG tablet   Oral   Take 850 mg by mouth 2 (two) times daily.         . metoprolol tartrate (LOPRESSOR) 50 MG tablet   Oral   Take 1 tablet (50 mg total) by mouth 2 (two) times daily.   60 tablet   11   . mometasone-formoterol (DULERA) 200-5 MCG/ACT AERO   Inhalation   Inhale 2 puffs into the lungs 2 (two) times daily.         . ondansetron (ZOFRAN) 4 MG tablet   Oral   Take 1 tablet (4 mg total) by mouth every 8 (eight) hours as needed for nausea or vomiting.   40 tablet   1   . pantoprazole (PROTONIX) 40 MG tablet   Oral   Take 40 mg by mouth every morning.         . rivaroxaban (XARELTO) 20 MG TABS tablet   Oral   Take 1 tablet (20 mg total) by mouth daily with supper.   30 tablet   0   . simvastatin (ZOCOR) 20 MG tablet   Oral   Take 20  mg by mouth every evening.         Marland Kitchen spironolactone (ALDACTONE) 50 MG  tablet   Oral   Take 2 tablets (100 mg total) by mouth daily.   180 tablet   3    BP 141/41  Pulse 143  Temp(Src) 97.8 F (36.6 C) (Oral)  Resp 26  SpO2 98% Physical Exam  Nursing note and vitals reviewed. Constitutional: He is oriented to person, place, and time. He appears well-developed and well-nourished.  HENT:  Head: Normocephalic and atraumatic.  Eyes: Conjunctivae and EOM are normal. Pupils are equal, round, and reactive to light.  Neck: Normal range of motion. Neck supple.  Cardiovascular: Regular rhythm and normal heart sounds.   Tachycardia   Pulmonary/Chest: Effort normal and breath sounds normal.  Abdominal: Soft. Bowel sounds are normal.  Musculoskeletal: Normal range of motion.  Neurological: He is alert and oriented to person, place, and time.  Skin: Skin is warm and dry.  Psychiatric: He has a normal mood and affect. His behavior is normal.    ED Course  Procedures (including critical care time)......Marland KitchenProcedure...Marland KitchenMarland KitchenMarland Kitchen carotid massage attempted. Pulse initially went from 145 to 115 beats per minute.   However, tachycardia returned after cessation of carotid massage.   ..  DIAGNOSTIC STUDIES: Oxygen Saturation is 98% on RA, normal by my interpretation.    COORDINATION OF CARE: 7:00 PM- Performed a carotid massage. Discussed clinical suspicion that pt has SVT. Will order Cardizem and lab work. Discussed possible admission. Pt advised of plan for treatment and pt agrees.  Results for orders placed during the hospital encounter of [id number]  BASIC METABOLIC PANEL      Result Value Ref Range   Sodium 138  137 - 147 mEq/L   Potassium 3.9  3.7 - 5.3 mEq/L   Chloride 97  96 - 112 mEq/L   CO2 22  19 - 32 mEq/L   Glucose, Bld 160 (*) 70 - 99 mg/dL   BUN 41 (*) 6 - 23 mg/dL   Creatinine, Ser 1.08  0.50 - 1.35 mg/dL   Calcium 9.6  8.4 - 10.5 mg/dL   GFR calc non Af Amer 73 (*) >90 mL/min   GFR calc Af Amer 84 (*) >90 mL/min  CBC WITH DIFFERENTIAL      Result  Value Ref Range   WBC 9.8  4.0 - 10.5 K/uL   RBC 4.59  4.22 - 5.81 MIL/uL   Hemoglobin 14.8  13.0 - 17.0 g/dL   HCT 43.0  39.0 - 52.0 %   MCV 93.7  78.0 - 100.0 fL   MCH 32.2  26.0 - 34.0 pg   MCHC 34.4  30.0 - 36.0 g/dL   RDW 13.6  11.5 - 15.5 %   Platelets 306  150 - 400 K/uL   Neutrophils Relative % 58  43 - 77 %   Neutro Abs 5.7  1.7 - 7.7 K/uL   Lymphocytes Relative 26  12 - 46 %   Lymphs Abs 2.5  0.7 - 4.0 K/uL   Monocytes Relative 7  3 - 12 %   Monocytes Absolute 0.7  0.1 - 1.0 K/uL   Eosinophils Relative 9 (*) 0 - 5 %   Eosinophils Absolute 0.9 (*) 0.0 - 0.7 K/uL   Basophils Relative 0  0 - 1 %   Basophils Absolute 0.0  0.0 - 0.1 K/uL  TROPONIN I      Result Value Ref Range   Troponin I <0.30  <0.30  ng/mL   Dg Chest Port 1 View  06/06/2013   CLINICAL DATA:  Chest pain and shortness of breath.  EXAM: PORTABLE CHEST - 1 VIEW  COMPARISON:  The 03/2013 and prior chest radiographs  FINDINGS: Cardiomegaly and chronic interstitial prominence again noted.  A left-sided pacemaker neurostimulator device again noted.  Mild left apical scarring is present.  IMPRESSION: Cardiomegaly without evidence of acute cardiopulmonary disease.  Chronic interstitial prominence.   Electronically Signed   By: Hassan Rowan [name]   On: 06/06/2013 19:29     EKG Interpretation    Date/Time:  Thursday June 06 2013 18:30:48 EST Ventricular Rate:  144 PR Interval:  128 QRS Duration: 90 QT Interval:  300 QTC Calculation: 464 R Axis:   -15 Text Interpretation:  Sinus tachycardia Inferior infarct , age undetermined Cannot rule out Anterior infarct , age undetermined Abnormal ECG When compared with ECG of 20-Nov-2012 14:05, Minimal criteria for Anterior infarct are now Present Inferior infarct is now Present ST now depressed in Inferior leads ST now depressed in Anterolateral leads T wave amplitude has increased in Lateral leads Confirmed by [place] Forker  MD, Nazim Kadlec ([id number]) on 06/06/2013 7:17:36 PM            CRITICAL CARE Performed by: Nat Christen Total critical care time: 30 Critical care time was exclusive of separately billable procedures and treating other patients. Critical care was necessary to treat or prevent imminent or life-threatening deterioration. Critical care was time spent personally by me on the following activities: development of treatment plan with patient and/or surrogate as well as nursing, discussions with consultants, evaluation of patient's response to treatment, examination of patient, obtaining history from patient or surrogate, ordering and performing treatments and interventions, ordering and review of laboratory studies, ordering and review of radiographic studies, pulse oximetry and re-evaluation of patient's condition. MDM   Final diagnoses:  None    Patient has a host of medical problems. EKG shows supraventricular tachycardia rate of 145. IV Cardizem bolus and drip administered. Patient converted to normal sinus rhythm rate approximately 65. Initial troponin negative. Consult Dr. [place] Pink  I personally performed the services described in this documentation, which was scribed in my presence. The recorded information has been reviewed and is accurate.    Nat Christen, MD 06/06/13 2224

## 2013-06-06 NOTE — H&P (Addendum)
[facility] History and Physical  Ralph [name] OFH:[id number] DOB: December 18, 1950 DOA: 06/06/2013   PCP: Leonides Grills, MD  Specialists: Followed by cardiology. Dr. Harl Bowie, and Dr. Lovena Le. Also, followed by endocrinology, [name].  Chief Complaint: Heart was racing  HPI: Ralph [name] is a 63 y.o. male with a past medical history of nonalcoholic liver cirrhosis, diabetes mellitus, hypothyroidism, COPD, Afib, who has a pacemaker for his significant sinus pause, who was in his usual state of health earlier today when he was sitting and suddenly felt that his heart was racing. He's had these symptoms before, but the symptoms usually subside in about 10 minutes. However, today the symptoms lasted about 30 minutes and so, he decided to come in to the hospital. He denied any chest pain per se, but did have a suffocating feeling. He felt as if someone was strangling him. Also, had pain in both his arms. Had some shortness of breath. Denies any dizziness or syncopal episodes. Denies any noncompliance with medications. Denies any changes to his medications apart from increase in the dose of Invokana recently by his endocrinologist. He was given diltiazem in the emergency department and his heart rate is improved. His symptoms are also improving but he still has some arm pain. His pacemaker was recently evaluated and found to be functioning normally.  Home Medications: Prior to Admission medications   Medication Sig Start Date End Date Taking? Authorizing Provider  albuterol (PROVENTIL HFA;VENTOLIN HFA) 108 (90 BASE) MCG/ACT inhaler Inhale 2 puffs into the lungs every 6 (six) hours as needed for wheezing.   Yes Historical Provider, MD  buPROPion (WELLBUTRIN SR) 150 MG 12 hr tablet Take 150 mg by mouth 2 (two) times daily.   Yes Historical Provider, MD  Canagliflozin (INVOKANA) 300 MG TABS Take 300 mg by mouth every morning.   Yes Historical Provider, MD  diazepam (VALIUM) 10 MG tablet Take 10 mg  by mouth every 6 (six) hours as needed for anxiety.   Yes Historical Provider, MD  diltiazem (CARDIZEM CD) 360 MG 24 hr capsule Take 1 capsule (360 mg total) by mouth every morning. 04/01/13  Yes Arnoldo Lenis, MD  fluticasone (FLONASE) 50 MCG/ACT nasal spray Place 1 spray into the nose daily. 11/22/12  Yes Modena Jansky, MD  furosemide (LASIX) 20 MG tablet Take 2 tablets (40 mg total) by mouth every morning. 04/16/13  Yes Orvil Feil, NP  HYDROcodone-acetaminophen (NORCO) 10-325 MG per tablet Take 1 tablet by mouth every 6 (six) hours as needed for pain.   Yes Historical Provider, MD  insulin glargine (LANTUS) 100 units/mL SOLN Inject 100 Units into the skin daily. Pt uses 50 units in am and 50 units in pm   Yes Historical Provider, MD  levothyroxine (SYNTHROID, LEVOTHROID) 50 MCG tablet Take 50 mcg by mouth daily before breakfast.   Yes Historical Provider, MD  metFORMIN (GLUCOPHAGE) 1000 MG tablet Take 1,000 mg by mouth 2 (two) times daily with a meal.   Yes Historical Provider, MD  metoprolol tartrate (LOPRESSOR) 50 MG tablet Take 1 tablet (50 mg total) by mouth 2 (two) times daily. 05/20/13  Yes Arnoldo Lenis, MD  mometasone-formoterol [facility]) 200-5 MCG/ACT AERO Inhale 2 puffs into the lungs 2 (two) times daily.   Yes Historical Provider, MD  ondansetron (ZOFRAN) 4 MG tablet Take 1 tablet (4 mg total) by mouth every 8 (eight) hours as needed for nausea or vomiting. 04/16/13  Yes Orvil Feil, NP  pantoprazole (PROTONIX) 40  MG tablet Take 40 mg by mouth every morning.   Yes Historical Provider, MD  rivaroxaban (XARELTO) 20 MG TABS tablet Take 1 tablet (20 mg total) by mouth daily with supper. 11/22/12  Yes Modena Jansky, MD  simvastatin (ZOCOR) 20 MG tablet Take 20 mg by mouth every evening.   Yes Historical Provider, MD  spironolactone (ALDACTONE) 50 MG tablet Take 2 tablets (100 mg total) by mouth daily. 04/16/13  Yes Orvil Feil, NP  insulin NPH Human (HUMULIN N,NOVOLIN N) 100 UNIT/ML  injection Inject 5 Units into the skin 2 (two) times daily before a meal.    Historical Provider, MD  metFORMIN (GLUCOPHAGE) 850 MG tablet Take 850 mg by mouth 2 (two) times daily.    Historical Provider, MD    Allergies:  Allergies  Allergen Reactions  . Nitroglycerin Hives, Swelling and Rash    Past Medical History: Past Medical History  Diagnosis Date  . Hypertension   . Cirrhosis     Hep A and B immune  . Depressed   . Varicose vein     of esophagus  . Difficult intubation     "trouble waking up afterwards" (01/06/2012)  . CHF (congestive heart failure) 01/06/2012  . Sinus pause 01/06/2012    5.2 seconds  . Anginal pain   . Sleep apnea     "don't wear mask" (01/06/2012)  . Emphysema   . Type II diabetes mellitus   . GERD (gastroesophageal reflux disease)   . H/O hiatal hernia   . Coughing up blood     "comes from my throat" (01/06/2012)  . Arthritis     "back; fingers" (01/06/2012)  . Chronic lower back pain   . Fatty liver disease, nonalcoholic   . CHB (complete heart block)   . Presence of permanent cardiac pacemaker 9/292013    [facility]    Past Surgical History  Procedure Laterality Date  . Back surgery    . Spinal cord stimulator implant  2006  . Cholecystectomy  1993  . Nasal septum surgery  1992  . Tonsillectomy and adenoidectomy  1992  . Posterior fusion lumbar spine  1999    L4-5  . Lumbar disc surgery  1994; ~ 1995; ~ 1996  . Warthin's tumor excision  1990's    right  . Permanent pacemaker insertion  01/08/2012    CHB  . US echocardiography  12/28/2011    mild LVH,mild mitral annulara ca+,mild MR  . Nuclear stress test  10/19/2004    No ischemia  . Esophagogastroduodenoscopy  11/08/2004    KVQ:QVZDGL esophageal erosions consistent with erosive reflux esophagitis/Areas of hemorrhage and nodularity of the fundal mucosa of uncertain significance, biopsied.  Small hiatal hernia, otherwise normal stomach  . Colonoscopy  11/08/2004    OVF:IEPPIR rectum,  colon, TI.  Marland Kitchen Esophagogastroduodenoscopy  2010    Dr. Gala Romney: 3 columns Grade 1 varices, erosive esophagitis, HH, portal gastropathy, normal D1, D2  . Esophagogastroduodenoscopy (egd) with esophageal dilation N/A 02/14/2013    JJO:ACZYS 1 esophageal varices. Abnormal distal esophagus/status post biopsy after Maloney dilation. Portal gastropathy. Antral erosions-status post biopsy. path negative for H.pylori, benign path.    Social History: He lives in [place]. No smoking. Currently. He quit a few months ago. No alcohol use illicit drug use, usually independent with daily activities.  Family History:  There is history of diabetes, and degenerative disc disease in the family.  Review of Systems - History obtained from the patient General ROS: positive for  -  fatigue Psychological ROS: negative Ophthalmic ROS: negative ENT ROS: negative Allergy and Immunology ROS: negative Hematological and Lymphatic ROS: negative Endocrine ROS: negative Respiratory ROS: as in hpi Cardiovascular ROS: as in hpi Gastrointestinal ROS: no abdominal pain, change in bowel habits, or black or bloody stools Genito-Urinary ROS: no dysuria, trouble voiding, or hematuria Musculoskeletal ROS: chronic back pain Neurological ROS: no TIA or stroke symptoms Dermatological ROS: negative  Physical Examination  Filed Vitals:   06/06/13 2200 06/06/13 2230 06/06/13 2252 06/06/13 2253  BP: 129/54 120/67 134/65 134/65  Pulse: 65 71 65 68  Temp:    98 F (36.7 C)  TempSrc:    Oral  Resp:    22  SpO2: 93% 97% 95% 98%    BP 134/65  Pulse 68  Temp(Src) 98 F (36.7 C) (Oral)  Resp 22  SpO2 98%  General appearance: alert, cooperative, appears stated age, no distress and moderately obese Head: Normocephalic, without obvious abnormality, atraumatic Eyes: conjunctivae/corneas clear. PERRL, EOM's intact.  Throat: lips, mucosa, and tongue normal; teeth and gums normal Neck: no adenopathy, no carotid bruit, no JVD,  supple, symmetrical, trachea midline and thyroid not enlarged, symmetric, no tenderness/mass/nodules Resp: clear to auscultation bilaterally Cardio: regular rate and rhythm, S1, S2 normal, no murmur, click, rub or gallop GI: soft, non-tender; bowel sounds normal; no masses,  no organomegaly Extremities: minimal edema Bil LE Pulses: 2+ and symmetric Skin: Skin color, texture, turgor normal. No rashes or lesions Lymph nodes: Cervical, supraclavicular, and axillary nodes normal. Neurologic: Alert and oriented X 3, normal strength and tone. No focal deficits.  Laboratory Data: Results for orders placed during the hospital encounter of 06/06/13 (from the past 48 hour(s))  BASIC METABOLIC PANEL     Status: Abnormal   Collection Time    06/06/13  6:54 PM      Result Value Ref Range   Sodium 138  137 - 147 mEq/L   Potassium 3.9  3.7 - 5.3 mEq/L   Chloride 97  96 - 112 mEq/L   CO2 22  19 - 32 mEq/L   Glucose, Bld 160 (*) 70 - 99 mg/dL   BUN 41 (*) 6 - 23 mg/dL   Creatinine, Ser 1.08  0.50 - 1.35 mg/dL   Calcium 9.6  8.4 - 10.5 mg/dL   GFR calc non Af Amer 73 (*) >90 mL/min   GFR calc Af Amer 84 (*) >90 mL/min   Comment: (NOTE)     The eGFR has been calculated using the CKD EPI equation.     This calculation has not been validated in all clinical situations.     eGFR's persistently <90 mL/min signify possible Chronic Kidney     Disease.  CBC WITH DIFFERENTIAL     Status: Abnormal   Collection Time    06/06/13  6:54 PM      Result Value Ref Range   WBC 9.8  4.0 - 10.5 K/uL   RBC 4.59  4.22 - 5.81 MIL/uL   Hemoglobin 14.8  13.0 - 17.0 g/dL   HCT 43.0  39.0 - 52.0 %   MCV 93.7  78.0 - 100.0 fL   MCH 32.2  26.0 - 34.0 pg   MCHC 34.4  30.0 - 36.0 g/dL   RDW 13.6  11.5 - 15.5 %   Platelets 306  150 - 400 K/uL   Neutrophils Relative % 58  43 - 77 %   Neutro Abs 5.7  1.7 - 7.7 K/uL   Lymphocytes Relative  26  12 - 46 %   Lymphs Abs 2.5  0.7 - 4.0 K/uL   Monocytes Relative 7  3 - 12 %    Monocytes Absolute 0.7  0.1 - 1.0 K/uL   Eosinophils Relative 9 (*) 0 - 5 %   Eosinophils Absolute 0.9 (*) 0.0 - 0.7 K/uL   Basophils Relative 0  0 - 1 %   Basophils Absolute 0.0  0.0 - 0.1 K/uL  TROPONIN I     Status: None   Collection Time    06/06/13  6:54 PM      Result Value Ref Range   Troponin I <0.30  <0.30 ng/mL   Comment:            Due to the release kinetics of cTnI,     a negative result within the first hours     of the onset of symptoms does not rule out     myocardial infarction with certainty.     If myocardial infarction is still suspected,     repeat the test at appropriate intervals.    Radiology Reports: Dg Chest Port 1 View  06/06/2013   CLINICAL DATA:  Chest pain and shortness of breath.  EXAM: PORTABLE CHEST - 1 VIEW  COMPARISON:  The 03/2013 and prior chest radiographs  FINDINGS: Cardiomegaly and chronic interstitial prominence again noted.  A left-sided pacemaker neurostimulator device again noted.  Mild left apical scarring is present.  IMPRESSION: Cardiomegaly without evidence of acute cardiopulmonary disease.  Chronic interstitial prominence.   Electronically Signed   By: Hassan Rowan [name]   On: 06/06/2013 19:29    Electrocardiogram: Sinus tachycardia 144 beats per minute. Normal axis. Intervals are normal. No Q waves. No concerning ST or T-wave changes are noted.  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 He was given diltiazem with which his heart rate is controlled now. He does have a pacemaker in situ, for  sinus pause, placed in 2013. He will need to be observed overnight.  Plan: #1 SVT versus sinus tachycardia: Etiology remains unclear. We'll consult cardiology in the morning. His pacemaker will need to be interrogated. His dose of beta blocker will be given tonight. TSH level will be checked along with free T4 in the morning as he was started on his thyroid medication recently. We'll defer further management to cardiology. Check magnesium level. Cycle troponins. Repeat EKG now that his heart rate is better.  #2 history of diabetes mellitus, type II: Continue with his home regimen. Place him on sliding scale coverage. Check HbA1c.  #3 history of sinus pause with pacemaker: As above.  #4 history of atrial fibrillation on anticoagulation: Continue with the Rivaroxaban. Continue with his diltiazem and beta blocker.  #5 history of nonalcoholic liver cirrhosis: Stable. Outpatient management.  #6 history of hypothyroidism: Recently started on Synthroid. Check TSH, free T4 in the morning.  #7 history of COPD, on home oxygen: Continue with oxygen. Albuterol as needed. Continue with his inhaled steroids.  #8 history of chronic back pain: Continue narcotics. Mobilize.  DVT Prophylaxis: He is on full anticoagulation Code Status: Full code Family Communication: Discussed with patient  Disposition Plan: Observe the telemetry   Further management decisions will depend on results of further testing and patient's response to treatment.  [facility]  [facility] Pager [phone]  If 7PM-7AM, please contact night-coverage [email or web address] Password [facility]  06/06/2013, 10:59 PM

## 2013-06-06 NOTE — ED Notes (Signed)
Pt states he was sitting down when his heart "started racing". Pt also reports SOB. Pt has hx of a-fib. Pt has pacemaker.

## 2013-06-07 ENCOUNTER — Encounter (HOSPITAL_COMMUNITY): Payer: Self-pay | Admitting: *Deleted

## 2013-06-07 ENCOUNTER — Telehealth: Payer: Self-pay

## 2013-06-07 DIAGNOSIS — L0291 Cutaneous abscess, unspecified: Secondary | ICD-10-CM

## 2013-06-07 DIAGNOSIS — E669 Obesity, unspecified: Secondary | ICD-10-CM

## 2013-06-07 DIAGNOSIS — J449 Chronic obstructive pulmonary disease, unspecified: Secondary | ICD-10-CM

## 2013-06-07 DIAGNOSIS — K7460 Unspecified cirrhosis of liver: Secondary | ICD-10-CM

## 2013-06-07 DIAGNOSIS — L0390 Cellulitis, unspecified: Secondary | ICD-10-CM

## 2013-06-07 DIAGNOSIS — I455 Other specified heart block: Secondary | ICD-10-CM

## 2013-06-07 DIAGNOSIS — I4892 Unspecified atrial flutter: Secondary | ICD-10-CM

## 2013-06-07 DIAGNOSIS — G4730 Sleep apnea, unspecified: Secondary | ICD-10-CM

## 2013-06-07 LAB — HEMOGLOBIN A1C
Hgb A1c MFr Bld: 7.9 % — ABNORMAL HIGH (ref <5.7)
Mean Plasma Glucose: 180 mg/dL — ABNORMAL HIGH (ref <117)

## 2013-06-07 LAB — CBC
HEMATOCRIT: 42.5 % (ref 39.0–52.0)
HEMOGLOBIN: 14.5 g/dL (ref 13.0–17.0)
MCH: 31.9 pg (ref 26.0–34.0)
MCHC: 34.1 g/dL (ref 30.0–36.0)
MCV: 93.6 fL (ref 78.0–100.0)
Platelets: 278 10*3/uL (ref 150–400)
RBC: 4.54 MIL/uL (ref 4.22–5.81)
RDW: 13.7 % (ref 11.5–15.5)
WBC: 8.3 10*3/uL (ref 4.0–10.5)

## 2013-06-07 LAB — GLUCOSE, CAPILLARY
GLUCOSE-CAPILLARY: 155 mg/dL — AB (ref 70–99)
Glucose-Capillary: 160 mg/dL — ABNORMAL HIGH (ref 70–99)
Glucose-Capillary: 237 mg/dL — ABNORMAL HIGH (ref 70–99)

## 2013-06-07 LAB — BASIC METABOLIC PANEL
BUN: 34 mg/dL — AB (ref 6–23)
CHLORIDE: 99 meq/L (ref 96–112)
CO2: 26 meq/L (ref 19–32)
Calcium: 9.3 mg/dL (ref 8.4–10.5)
Creatinine, Ser: 1 mg/dL (ref 0.50–1.35)
GFR calc Af Amer: >90 mL/min (ref >90)
GFR calc non Af Amer: 80 mL/min — ABNORMAL LOW (ref >90)
GLUCOSE: 162 mg/dL — AB (ref 70–99)
POTASSIUM: 4.2 meq/L (ref 3.7–5.3)
Sodium: 139 mEq/L (ref 137–147)

## 2013-06-07 LAB — TROPONIN I
Troponin I: <0.3 ng/mL (ref <0.30)
Troponin I: <0.3 ng/mL (ref <0.30)
Troponin I: <0.3 ng/mL (ref <0.30)

## 2013-06-07 LAB — T4, FREE: Free T4: 1.32 ng/dL (ref 0.80–1.80)

## 2013-06-07 LAB — TSH: TSH: 2.333 u[IU]/mL (ref 0.350–4.500)

## 2013-06-07 MED ORDER — INSULIN NPH (HUMAN) (ISOPHANE) 100 UNIT/ML ~~LOC~~ SUSP
5.0000 [IU] | Freq: Two times a day (BID) | SUBCUTANEOUS | Status: DC
  Filled 2013-06-07: qty 10

## 2013-06-07 MED ORDER — SODIUM CHLORIDE 0.9 % IJ SOLN: 3.0000 mL | Freq: Two times a day (BID) | INTRAMUSCULAR | Status: DC

## 2013-06-07 MED ORDER — ONDANSETRON HCL 4 MG/2ML IJ SOLN: 4.0000 mg | Freq: Four times a day (QID) | INTRAMUSCULAR | Status: DC | PRN

## 2013-06-07 MED ORDER — MOMETASONE FURO-FORMOTEROL FUM 200-5 MCG/ACT IN AERO
2.0000 | INHALATION_SPRAY | Freq: Two times a day (BID) | RESPIRATORY_TRACT | Status: DC
  Administered 2013-06-07: 2 via RESPIRATORY_TRACT
  Filled 2013-06-07: qty 8.8

## 2013-06-07 MED ORDER — RIVAROXABAN 20 MG PO TABS
20.0000 mg | ORAL_TABLET | Freq: Every day | ORAL | Status: DC
  Administered 2013-06-07: 20 mg via ORAL
  Filled 2013-06-07: qty 1

## 2013-06-07 MED ORDER — SPIRONOLACTONE 25 MG PO TABS
100.0000 mg | ORAL_TABLET | Freq: Every day | ORAL | Status: DC
  Administered 2013-06-07: 100 mg via ORAL
  Filled 2013-06-07: qty 4

## 2013-06-07 MED ORDER — INSULIN GLARGINE 100 UNIT/ML ~~LOC~~ SOLN
50.0000 [IU] | Freq: Two times a day (BID) | SUBCUTANEOUS | Status: DC
  Administered 2013-06-07: 50 [IU] via SUBCUTANEOUS
  Filled 2013-06-07 (×7): qty 0.5

## 2013-06-07 MED ORDER — SODIUM CHLORIDE 0.9 % IJ SOLN: 3.0000 mL | INTRAMUSCULAR | Status: DC | PRN

## 2013-06-07 MED ORDER — DILTIAZEM HCL ER COATED BEADS 180 MG PO CP24
360.0000 mg | ORAL_CAPSULE | Freq: Every morning | ORAL | Status: DC
Start: 2013-06-07 — End: 2013-06-07
  Administered 2013-06-07: 360 mg via ORAL
  Filled 2013-06-07: qty 2

## 2013-06-07 MED ORDER — SODIUM CHLORIDE 0.9 % IJ SOLN
3.0000 mL | Freq: Two times a day (BID) | INTRAMUSCULAR | Status: DC
  Administered 2013-06-07 (×2): 3 mL via INTRAVENOUS

## 2013-06-07 MED ORDER — SIMVASTATIN 20 MG PO TABS
20.0000 mg | ORAL_TABLET | Freq: Every evening | ORAL | Status: DC
  Administered 2013-06-07: 20 mg via ORAL
  Filled 2013-06-07: qty 1

## 2013-06-07 MED ORDER — PANTOPRAZOLE SODIUM 40 MG PO TBEC
40.0000 mg | DELAYED_RELEASE_TABLET | ORAL | Status: DC
  Administered 2013-06-07: 40 mg via ORAL
  Filled 2013-06-07: qty 1

## 2013-06-07 MED ORDER — FUROSEMIDE 40 MG PO TABS
40.0000 mg | ORAL_TABLET | Freq: Every morning | ORAL | Status: DC
  Administered 2013-06-07: 40 mg via ORAL
  Filled 2013-06-07: qty 1

## 2013-06-07 MED ORDER — CANAGLIFLOZIN 300 MG PO TABS: 300.0000 mg | ORAL_TABLET | Freq: Every morning | ORAL | Status: DC

## 2013-06-07 MED ORDER — DIAZEPAM 5 MG PO TABS: 10.0000 mg | ORAL_TABLET | Freq: Four times a day (QID) | ORAL | Status: DC | PRN

## 2013-06-07 MED ORDER — SULFAMETHOXAZOLE-TMP DS 800-160 MG PO TABS: 1.0000 | ORAL_TABLET | Freq: Two times a day (BID) | ORAL | Status: DC

## 2013-06-07 MED ORDER — LEVOTHYROXINE SODIUM 50 MCG PO TABS
50.0000 ug | ORAL_TABLET | Freq: Every day | ORAL | Status: DC
  Administered 2013-06-07: 50 ug via ORAL
  Filled 2013-06-07: qty 1

## 2013-06-07 MED ORDER — SODIUM CHLORIDE 0.9 % IV SOLN: 250.0000 mL | INTRAVENOUS | Status: DC | PRN

## 2013-06-07 MED ORDER — ALBUTEROL SULFATE (2.5 MG/3ML) 0.083% IN NEBU: 2.5000 mg | INHALATION_SOLUTION | RESPIRATORY_TRACT | Status: DC | PRN

## 2013-06-07 MED ORDER — ACETAMINOPHEN 325 MG PO TABS: 650.0000 mg | ORAL_TABLET | Freq: Four times a day (QID) | ORAL | Status: DC | PRN

## 2013-06-07 MED ORDER — ONDANSETRON HCL 4 MG PO TABS: 4.0000 mg | ORAL_TABLET | Freq: Four times a day (QID) | ORAL | Status: DC | PRN

## 2013-06-07 MED ORDER — HYDROCODONE-ACETAMINOPHEN 10-325 MG PO TABS: 1.0000 | ORAL_TABLET | Freq: Four times a day (QID) | ORAL | Status: DC | PRN

## 2013-06-07 MED ORDER — CANAGLIFLOZIN 100 MG PO TABS
300.0000 mg | ORAL_TABLET | Freq: Every day | ORAL | Status: DC
  Administered 2013-06-07: 300 mg via ORAL
  Filled 2013-06-07 (×4): qty 3

## 2013-06-07 MED ORDER — BUPROPION HCL ER (SR) 150 MG PO TB12
150.0000 mg | ORAL_TABLET | Freq: Two times a day (BID) | ORAL | Status: DC
  Administered 2013-06-07 (×2): 150 mg via ORAL
  Filled 2013-06-07 (×8): qty 1

## 2013-06-07 MED ORDER — INSULIN ASPART 100 UNIT/ML ~~LOC~~ SOLN: 0.0000 [IU] | Freq: Every day | SUBCUTANEOUS | Status: DC

## 2013-06-07 MED ORDER — FLUTICASONE PROPIONATE 50 MCG/ACT NA SUSP
1.0000 | Freq: Every day | NASAL | Status: DC
  Administered 2013-06-07: 1 via NASAL
  Filled 2013-06-07: qty 16

## 2013-06-07 MED ORDER — METOPROLOL TARTRATE 50 MG PO TABS
50.0000 mg | ORAL_TABLET | Freq: Two times a day (BID) | ORAL | Status: DC
  Administered 2013-06-07: 50 mg via ORAL
  Filled 2013-06-07: qty 1

## 2013-06-07 MED ORDER — INSULIN ASPART 100 UNIT/ML ~~LOC~~ SOLN
0.0000 [IU] | Freq: Three times a day (TID) | SUBCUTANEOUS | Status: DC
  Administered 2013-06-07: 3 [IU] via SUBCUTANEOUS
  Administered 2013-06-07: 5 [IU] via SUBCUTANEOUS
  Administered 2013-06-07: 3 [IU] via SUBCUTANEOUS

## 2013-06-07 MED ORDER — ACETAMINOPHEN 650 MG RE SUPP: 650.0000 mg | Freq: Four times a day (QID) | RECTAL | Status: DC | PRN

## 2013-06-07 MED ORDER — BUPROPION HCL ER (SR) 150 MG PO TB12
ORAL_TABLET | ORAL | Status: AC
  Filled 2013-06-07: qty 1

## 2013-06-07 NOTE — Discharge Instructions (Signed)
Atrial Flutter Atrial flutter is a heart rhythm that can cause the heart to beat very fast (tachycardia). It originates in the upper chambers of the heart (atria). In atrial flutter, the top chambers of the heart (atria) often beat much faster than the bottom chambers of the heart (ventricles). Atrial flutter has a regular "saw toothed" appearance in an EKG readout. An EKG is a test that records the electrical activity of the heart. Atrial flutter can cause the heart to beat up to 150 beats per minute (BPM). Atrial flutter can either be short lived (paroxysmal) or permanent.  CAUSES  Causes of atrial flutter can be many. Some of these include:  Heart related issues:  Heart attack (myocardial infarction).  Heart failure.  Heart valve problems.  Poorly controlled high blood pressure (hypertension).  Afteropen heart surgery.  Lung related issues:  A blood clot in the lungs (pulmonary embolism).  Chronic obstructive pulmonary disease (COPD). Medications used to treat COPD can attribute to atrial flutter.  Other related causes:  Hyperthyroidism.  Caffeine.  Some decongestant cold medications.  Low electrolyte levels such as potassium or magnesium.  Cocaine. SYMPTOMS  An awareness of your heart beating rapidly (palpitations).  Shortness of breath.  Chest pain.  Low blood pressure (hypotension).  Dizziness or fainting. DIAGNOSIS  Different tests can be performed to diagnose atrial flutter.   An EKG.  Holter monitor. This is a 24 hour recording of your heart rhythm. You will also be given a diary. Write down all symptoms that you have and what you were doing at the time you experienced symptoms.  Cardiac event monitor. This small device can be worn for up to 30 days. When you have heart symptoms, you will push a button on the device. This will then record your heart rhythm.  Echocardiogram. This is an imaging test to look at your heart. Your caregiver will look at your  heart valves and the ventricles.  Stress Test. This test can help determine if the atrial flutter is related to exercise or if coronary artery disease is present.  Laboratory studies will look at certain blood levels like:  Complete blood count (CBC).  Potassium.  Magnesium.  Thyroid function. TREATMENT  Treatment of atrial flutter varies. A combination of therapies may be used or sometimes atrial flutter may need only 1 type of treatment.  Lab work: If your blood work, such as your electrolytes (potassium, magnesium) or your thyroid function tests are abnormal, your caregiver will treat them accordingly.  Medication:  There are several different types of medications that can convert your heart to a normal rhythm and prevent atrial flutter from reoccurring.  Nonsurgical procedures: Nonsurgical techniques may be used to control atrial flutter. Some examples include:  Cardioversion. This technique uses either drugs or an electrical shock to restore a normal heart rhythm:  Cardioversion drugs may be given through an intravenous (IV) line to help "reset" the heart rhythm.  In electrical cardioversion, your caregiver shocks your heart with electrical energy. This helps to reset the heartbeat to a normal rhythm.  Ablation. If atrial flutter is a persistent problem, an ablation may be needed. This procedure is done under mild sedation. High frequency radio-wave energy is used to destroy the area of heart tissue responsible for atrial flutter. SEEK IMMEDIATE MEDICAL CARE IF:   Dizziness.  Near fainting or fainting.  Shortness of breath.  Chest pain or pressure.  Sudden nausea or vomiting.  Profuse sweating. If you have the above symptoms, call your  local emergency service immediately! Do not drive yourself to the hospital. MAKE SURE YOU:   Understand these instructions.  Will watch your condition.  Will get help right away if you are not doing well or get worse. Document  Released: 08/14/2008 Document Revised: 06/20/2011 Document Reviewed: 08/14/2008 [facility] Patient Information 2014 [place], [place].

## 2013-06-07 NOTE — Consult Note (Signed)
CARDIOLOGY CONSULT NOTE   Patient ID: Ralph [name] MRN: [id number] DOB/AGE: [birth year] 61 y.o.  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       In ER HR was 143, BP 141/41. Was given IV diltazem bolus 10 mg and started on a diltiazem gtt, and also given metoprolol 50 mg. EKG demonstrated SVT with infero/lateral ST depression. Troponin negative X 3. Pacemaker interrogation on 05/22/2013 which demonstrated normal function, 1 mode switch, 6 seconds, no high ventricular rates, he was in NSR for over 99% of the time. He had been on multaq 400 mg BID in the past but was unable to tolerate this.       He states that he was in his usual state of health when he began to feel his heart racing with associated choking feeling and left upper arm soreness. He state this happens about twice a month and goes away on its own. He has been outside in the snow earlier in the day to buy dog food around 10 am, but symptoms did not begin until around 6 pm. He is very "OCD about my medications" and never miss a dose. When HR remained elevated he came to ER. He was converted to NSR and has been kept overnight for evaluation.        He has been taking doxycycline for the last two weeks due to infected lesion on his back per PCP, He  states that it was a black head that became worse. It is localized to his lower left back, and does not appear to be shingles.   He is feeling much better with the exception of fatigue.           Allergies  Allergen Reactions  . Nitroglycerin Hives,  Swelling and Rash    Medications Scheduled Medications: . buPROPion  150 mg Oral BID  . Canagliflozin  300 mg Oral Daily  . diltiazem  360 mg Oral q morning - 10a  . fluticasone  1 spray Each Nare Daily  . furosemide  40 mg Oral q morning - 10a  . insulin aspart  0-15 Units Subcutaneous TID WC  . insulin aspart  0-5 Units Subcutaneous QHS  . insulin glargine  50 Units Subcutaneous BID  . levothyroxine  50 mcg Oral QAC breakfast  . metoprolol tartrate  50 mg Oral BID  . mometasone-formoterol  2 puff Inhalation BID  . pantoprazole  40 mg Oral BH-q7a  . rivaroxaban  20 mg Oral Q supper  . simvastatin  20 mg Oral QPM  . sodium chloride  3 mL Intravenous Q12H  . sodium chloride  3 mL Intravenous Q12H  . spironolactone  100 mg Oral Daily       PRN Medications: sodium chloride, acetaminophen, acetaminophen, albuterol, diazepam, HYDROcodone-acetaminophen, ondansetron (ZOFRAN) IV, ondansetron, sodium chloride   Past Medical History  Diagnosis Date  . Hypertension   . Cirrhosis     Hep A and B immune  . Depressed   . Varicose vein     of esophagus  . Difficult intubation     "trouble waking up afterwards" (01/06/2012)  . CHF (congestive heart failure)  01/06/2012  . Sinus pause 01/06/2012    5.2 seconds  . Anginal pain   . Sleep apnea     "don't wear mask" (01/06/2012)  . Emphysema   . Type II diabetes mellitus   . GERD (gastroesophageal reflux disease)   . H/O hiatal hernia   . Coughing up blood     "comes from my throat" (01/06/2012)  . Arthritis     "back; fingers" (01/06/2012)  . Chronic lower back pain   . Fatty liver disease, nonalcoholic   . CHB (complete heart block)   . Presence of permanent cardiac pacemaker 9/292013    [employer]Jude    Past Surgical History  Procedure Laterality Date  . Back surgery    . Spinal cord stimulator implant  2006  . Cholecystectomy  1993  . Nasal septum surgery  1992  . Tonsillectomy and adenoidectomy  1992  . Posterior fusion lumbar  spine  1999    L4-5  . Lumbar disc surgery  1994; ~ 1995; ~ 1996  . Warthin's tumor excision  1990's    right  . Permanent pacemaker insertion  01/08/2012    CHB  . US echocardiography  12/28/2011    mild LVH,mild mitral annulara ca+,mild MR  . Nuclear stress test  10/19/2004    No ischemia  . Esophagogastroduodenoscopy  11/08/2004    YBO:FBPZWC esophageal erosions consistent with erosive reflux esophagitis/Areas of hemorrhage and nodularity of the fundal mucosa of uncertain significance, biopsied.  Small hiatal hernia, otherwise normal stomach  . Colonoscopy  11/08/2004    HEN:IDPOEU rectum, colon, TI.  Marland Kitchen Esophagogastroduodenoscopy  2010    Dr. Gala Romney: 3 columns Grade 1 varices, erosive esophagitis, HH, portal gastropathy, normal D1, D2  . Esophagogastroduodenoscopy (egd) with esophageal dilation N/A 02/14/2013    MPN:TIRWE 1 esophageal varices. Abnormal distal esophagus/status post biopsy after Maloney dilation. Portal gastropathy. Antral erosions-status post biopsy. path negative for H.pylori, benign path.    Family History  Problem Relation Age of Onset  . Stroke Brother   . Cancer Mother   . Arrhythmia Father     Social History Ralph [name] reports that he has quit smoking. His smoking use included Cigarettes. He smoked 0.00 packs per day for 45 years. He quit smokeless tobacco use about 6 months ago. Ralph [name] reports that he does not drink alcohol.  Review of Systems Otherwise reviewed and negative except as outlined.  Physical Examination Blood pressure 112/53, pulse 60, temperature 97.8 F (36.6 C), temperature source Oral, resp. rate 15, height 5\' 10"  (1.778 m), weight 250 lb 7.1 oz (113.6 kg), SpO2 92.00%.  Intake/Output Summary (Last 24 hours) at 06/07/13 1154 Last data filed at 06/07/13 0800  Gross per 24 hour  Intake    240 ml  Output      0 ml  Net    240 ml    Telemetry: NSR  HEENT: Conjunctiva and lids normal, oropharynx clear with moist mucosa. Neck:  Supple, no elevated JVP or carotid bruits, no thyromegaly. Lungs: Clear to auscultation, nonlabored breathing at rest. Cardiac: Regular rate and rhythm, no S3 or significant systolic murmur, no pericardial rub. Abdomen: Soft, nontender, no hepatomegaly, bowel sounds present, no guarding or rebound. Extremities: No pitting edema, distal pulses 2+. Skin: Warm and dry. Lesion on left lower back, erythematous, sore.  Musculoskeletal: No kyphosis. Neuropsychiatric: Alert and oriented x3, affect grossly appropriate.  Prior Cardiac Testing/Procedures  1.Echocardiogram 11/21/2012 Left ventricle: The cavity size was normal. Wall thickness was increased in a  pattern of mild LVH. There was moderate asymmetric hypertrophy of the septum. Systolic function was normal. The estimated ejection fraction was in the range of 60% to 65%. Wall motion was normal; there were no regional wall motion abnormalities. The study is not technically sufficient to allow evaluation of LV diastolic function. - Mitral valve: Trivial regurgitation. - Left atrium: The atrium was mildly dilated. - Right ventricle: The cavity size was mildly to moderately dilated. Pacer wire or catheter noted in right ventricle. Systolic function was normal. - Right atrium: Central venous pressure: 60mm Hg (est). - Tricuspid valve: Trivial regurgitation. - Pulmonary arteries: PA peak pressure: 48mm Hg (S). - Pericardium, extracardiac: There was no pericardial effusion.  2. Nuclear stress test 05/04/2012 Impression Exercise Capacity: Lexiscan with no exercise. BP Response: Normal blood pressure response. Clinical Symptoms: No significant symptoms noted. ECG Impression: No significant ECG changes with Lexiscan. Comparison with Prior Nuclear Study: No significant change from  previous study Overall Impression: Normal stress nuclear study. LV Wall Motion: NL LV Function; NL Wall Motion  3. Paceheart interogation 05/22/2013 Normal Device  function, Thresholds and sensing, impedances consistent with previous measurements. Device programmed to maximize longevity. 1 mode switch, 6 seconds. No high ventricular rates noted.    Lab Results  Basic Metabolic Panel:  Recent Labs Lab 06/06/13 1854 06/06/13 2300 06/07/13 0559  NA 138  --  139  K 3.9  --  4.2  CL 97  --  99  CO2 22  --  26  GLUCOSE 160*  --  162*  BUN 41*  --  34*  CREATININE 1.08  --  1.00  CALCIUM 9.6  --  9.3  MG  --  2.2  --     CBC:  Recent Labs Lab 06/06/13 1854 06/07/13 0559  WBC 9.8 8.3  NEUTROABS 5.7  --   HGB 14.8 14.5  HCT 43.0 42.5  MCV 93.7 93.6  PLT 306 278    Cardiac Enzymes:  Recent Labs Lab 06/06/13 1854 06/07/13 0058 06/07/13 0630  TROPONINI <0.30 <0.30 <0.30     Radiology: Dg Chest Port 1 View  06/06/2013   CLINICAL DATA:  Chest pain and shortness of breath.  EXAM: PORTABLE CHEST - 1 VIEW  COMPARISON:  The 03/2013 and prior chest radiographs  FINDINGS: Cardiomegaly and chronic interstitial prominence again noted.  A left-sided pacemaker neurostimulator device again noted.  Mild left apical scarring is present.  IMPRESSION: Cardiomegaly without evidence of acute cardiopulmonary disease.  Chronic interstitial prominence.   Electronically Signed   By: Hassan Rowan [name]   On: 06/06/2013 19:29     ECG: SVT rate of 148 bpm with infero/lateral ST depression noted vs afib with RVR.   Impression and Recommendations  1.Acute SVT: Was converted to NSR in ER with diltiazem bolus and placed on gtt. Now resumed on home dose of diltiazem 360 mg daily and metoprolol 50 mg BID. No recurrence since admission. Only complains of fatigue. Will recheck pacemaker interrogation for episodes or arrhythmia just prior to onset of this episode. Potassium 3.9 on admission. He has had episode one or twice a month. Saw Dr. Harl Bowie on 05/20/2013 and was increased on metoprolol to 50 mg BID and ASA was stopped due to grade I  esophageal varies history.   2.  Atrial fib: Remains in NSR.  Continue Xaretlo and rate control medications. No ASA.  No active bleeding. No evidence of anemia on CBC.   3. [employer] Pacemaker in Situ: Reviewed Paceart interrogation  on 05/22/2013. He did not have but on mode switch, lasting 6 seconds. As above will be re-interrogated.  4. Hypertension: Currently well controlled.   5. Infected lesion on lower left back: Has been on doxycycline for 2 weeks. Reviewed current medications for drug interactions causing symptoms. No interactions were noted on review.     Signed: Phill Myron. Purcell Nails NP [facility] 06/07/2013, 11:54 AM Co-Sign MD

## 2013-06-07 NOTE — Discharge Summary (Signed)
Physician Discharge Summary  Ralph [name] WFU:[id number] DOB: 1950-06-02 DOA: 06/06/2013  PCP: Leonides Grills, MD  Admit date: 06/06/2013 Discharge date: 06/07/2013  Time spent: 45 minutes  Recommendations for Outpatient Follow-up:  1. Follow up with cardiology as outpatient 2. 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 male with a past medical history of nonalcoholic liver cirrhosis, diabetes mellitus, hypothyroidism, COPD, Afib, who has a pacemaker for his significant sinus pause, who was in his usual state of health earlier today when he was sitting and suddenly felt that his heart was racing. He's had these symptoms before, but the symptoms usually subside in about 10 minutes. However, today the symptoms lasted about 30 minutes and so, he decided to come in to the hospital. He denied any chest pain per se, but did have a suffocating feeling. He felt as if someone was strangling him. Also, had pain in both his arms. Had some shortness of breath. Denies any dizziness or syncopal episodes. Denies any noncompliance with medications. Denies any changes to his medications apart from increase in the dose of Invokana recently by his endocrinologist. He was given diltiazem in the emergency department and his heart rate is improved. His symptoms are also improving but he still has some arm  pain. His pacemaker was recently evaluated and found to be functioning normally.   Hospital Course:  This patient was admitted to the hospital with palpitations and found to have supraventricular tachycardia. He received IV diltiazem in the emergency room and converted back to sinus rhythm. Patient is on multiple cardiac medications and is being followed by electrophysiology. He also has a pacemaker in place. He was admitted to the hospital for observation.His pacemaker was interrogated and no acute events were noted. Cardiology recommended to continue his chronic medications and followup with his primary cardiologist/electrophysiologist. The patient was noted to have significant cellulitis with superficial abscess on his left upper back. This was draining. The fluid. He reports that he has been doxycycline for the past 2 weeks without resolution. It is unclear whether his infection/doxycycline may have triggered his arrhythmia. Significant period of fluid was expressed from the patient's draining abscess site. It was recommended that he continue with daily dressing changes and apply warm compresses. Antibiotic coverage was changed from doxycycline to Bactrim. He is asked to follow up with his primary care physician in the next one week for reevaluation of his infection. He has been educated regarding danger signs including worsening erythema, pain, high fevers. He's been cleared for discharge by cardiology will be discharged home later today.  MEMON,JEHANZEB   Procedures:  none  Consultations:  Cardiology  Discharge Exam: Filed Vitals:   06/07/13 1409  BP: 110/56  Pulse: 60  Temp: 98.4 F (36.9 C)  Resp: 18    General: NAD Cardiovascular: s1, S2 RRR Respiratory:  CTA B  Discharge Instructions  Discharge Orders   Future Appointments Provider Department Dept Phone   06/19/2013 2:30 PM [facility] Core Class 1 [facility] [phone]    06/26/2013 2:30 PM [facility] Core Class 2 [facility] [phone]   07/03/2013 2:30 PM [facility] Dm Core Class 3 [facility] [phone]   07/15/2013 2:00 PM Orvil Feil, NP [facility] [phone]   08/26/2013 8:00 AM [facility] [place] [facility] [phone]   08/26/2013 8:20 AM Arnoldo Lenis, MD [facility] [place] [phone]   Future Orders Complete By Expires   Call MD for:  redness, tenderness, or signs of infection (pain, swelling, redness, odor or green/yellow discharge around incision site)  As directed    Call MD for:  temperature >100.4  As directed    Diet - low sodium heart healthy  As directed    Increase activity slowly  As directed        Medication List         albuterol 108 (90 BASE) MCG/ACT inhaler  Commonly known as:  PROVENTIL HFA;VENTOLIN HFA  Inhale 2 puffs into the lungs every 6 (six) hours as needed for wheezing.     buPROPion 150 MG 12 hr tablet  Commonly known as:  WELLBUTRIN SR  Take 150 mg by mouth 2 (two) times daily.     diazepam 10 MG tablet  Commonly known as:  VALIUM  Take 10 mg by mouth every 6 (six) hours as needed for anxiety.     diltiazem 360 MG 24 hr capsule  Commonly known as:  CARDIZEM CD  Take 1 capsule (360 mg total) by mouth every morning.     DULERA 200-5 MCG/ACT Aero  Generic drug:  mometasone-formoterol  Inhale 2 puffs into the lungs 2 (two) times daily.     fluticasone 50 MCG/ACT nasal spray  Commonly known as:  FLONASE  Place 1 spray into the nose daily.     furosemide 20 MG tablet  Commonly known as:  LASIX  Take 2 tablets (40 mg total) by mouth every morning.     HYDROcodone-acetaminophen 10-325 MG per tablet  Commonly known as:  NORCO  Take 1 tablet by mouth every 6 (six) hours as needed for pain.     insulin glargine 100 units/mL Soln  Commonly known as:  LANTUS   Inject 50 Units into the skin 2 (two) times daily.     INVOKANA 300 MG Tabs  Generic drug:  Canagliflozin  Take 300 mg by mouth every morning.     levothyroxine 50 MCG tablet  Commonly known as:  SYNTHROID, LEVOTHROID  Take 25 mcg by mouth daily before breakfast.     metFORMIN 1000 MG tablet  Commonly known as:  GLUCOPHAGE  Take 1,000 mg by mouth 2 (two) times daily with a meal.     metoprolol 50 MG tablet  Commonly known as:  LOPRESSOR  Take 1 tablet (50 mg total) by mouth 2 (two) times daily.     ondansetron 4 MG tablet  Commonly known as:  ZOFRAN  Take 1 tablet (4 mg total) by mouth every 8 (eight) hours as needed for nausea or vomiting.     pantoprazole 40 MG tablet  Commonly known as:  PROTONIX  Take 40 mg by mouth every morning.     Rivaroxaban 20 MG Tabs tablet  Commonly known as:  XARELTO  Take 1 tablet (20 mg total) by mouth daily with supper.     simvastatin 20 MG tablet  Commonly known as:  ZOCOR  Take 20 mg by mouth every evening.     spironolactone 50 MG tablet  Commonly known as:  ALDACTONE  Take 2 tablets (100 mg total) by mouth daily.     sulfamethoxazole-trimethoprim 800-160 MG per tablet  Commonly known as:  BACTRIM DS  Take 1 tablet by mouth 2 (two) times daily.     Vitamin D (Ergocalciferol) 50000 UNITS Caps capsule  Commonly known as:  DRISDOL  Take 50,000 Units by mouth every 7 (seven) days. Takes on Fridays.       Allergies  Allergen Reactions  . Nitroglycerin Hives, Swelling and Rash       Follow-up Information   Follow up with Cristopher Peru, MD. (they will call you with appointment)    Specialty:  Cardiology   Contact information:   [id number] [place] [address] [phone]       Follow up with Leonides Grills, MD. Schedule an appointment as soon as possible for a visit in 1 week. (to follow up infection)    Specialty:  Family Medicine   Contact information:   [id number] [place] [id number] [phone]        The results of significant diagnostics from this hospitalization (including imaging, microbiology, ancillary and laboratory) are listed below for reference.    Significant Diagnostic Studies: Dg Chest Port 1 View  06/06/2013   CLINICAL DATA:  Chest pain and shortness of breath.  EXAM: PORTABLE CHEST - 1 VIEW  COMPARISON:  The 03/2013 and prior chest radiographs  FINDINGS: Cardiomegaly and chronic interstitial prominence again noted.  A left-sided pacemaker neurostimulator device again noted.  Mild left apical scarring is present.  IMPRESSION: Cardiomegaly without evidence of acute cardiopulmonary disease.  Chronic interstitial prominence.   Electronically Signed   By: Hassan Rowan [name]   On: 06/06/2013 19:29    Microbiology: No results found for this or any previous visit (from the past 240 hour(s)).   Labs: Basic Metabolic Panel:  Recent Labs Lab 06/06/13 1854 06/06/13 2300 06/07/13 0559  NA 138  --  139  K 3.9  --  4.2  CL 97  --  99  CO2 22  --  26  GLUCOSE 160*  --  162*  BUN 41*  --  34*  CREATININE 1.08  --  1.00  CALCIUM 9.6  --  9.3  MG  --  2.2  --    Liver Function Tests: No results found for this basename: AST, ALT, ALKPHOS, BILITOT, PROT, ALBUMIN,  in the last 168 hours No results found for this basename: LIPASE, AMYLASE,  in the last 168 hours No results found for this basename: AMMONIA,  in the last 168 hours CBC:  Recent Labs Lab 06/06/13 1854 06/07/13 0559  WBC 9.8 8.3  NEUTROABS 5.7  --   HGB 14.8 14.5  HCT 43.0 42.5  MCV 93.7 93.6  PLT 306 278   Cardiac Enzymes:  Recent Labs Lab 06/06/13 1854 06/07/13 0058 06/07/13 0630 06/07/13 1313  TROPONINI <0.30 <0.30 <0.30 <0.30   BNP: BNP (last 3 results)  Recent Labs  11/20/12 1430  PROBNP 69.0   CBG:  Recent Labs Lab 06/07/13 0712 06/07/13 1102 06/07/13 1649  GLUCAP 160* 237* 155*       Signed:  MEMON,JEHANZEB  [facility] 06/07/2013, 6:29 PM

## 2013-06-07 NOTE — Progress Notes (Signed)
Patient with orders to be discharge home. Discharge instructions given, patient verbalized understanding. Prescriptions given. Patient stable. Patient left in private vehicle.  

## 2013-06-07 NOTE — Progress Notes (Signed)
Patient pacemaker checked by staff from [facility], stated it was normal. Jory Sims, NP notified and Dr. Roderic Palau notified.

## 2013-06-07 NOTE — Consult Note (Signed)
The patient was seen and examined, and I agree with the assessment and plan as documented above, with modifications as noted below.  63 yr old patient with h/o atrial fibrillation for which he takes diltiazem, metoprolol, and Xarelto, as well as a pacemaker for sinus arrest. Had been tried on Multaq in the past but did not tolerate it. Had recent pacemaker interrogation which revealed he was in normal sinus rhythm for 99% of the time. He presented with palpitations which began to occur while he was sitting down, after having cooked some dinner. He denies associated chest pain. He says he has experienced similar occurrences approximately once every other week. He has also been taking doxycycline for an infected skin lesion on his back.  ECG on presentation shows type I counterclockwise typical atrial flutter, HR 144 bpm.  He was given IV diltiazem and his oral medications, and has subsequently converted back to a paced rhythm. I recommend continuing his outpatient medications. This most recent episode of atrial flutter may have very well been triggered by his infected back lesion with use of doxycycline. 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 I would have him follow-up within the next 1-2 weeks with Dr. Lovena Le, his electrophysiologist, for consideration for flutter ablation.

## 2013-06-07 NOTE — Progress Notes (Signed)
Patient complain of SOB and heaviness in his chest. Patient on O2 at 2lpm via nasal cannula. O2 sats 93% Sat patient up in bed, SOB relieved, still heaviness in his chest. Dr. Roderic Palau notified. New orders for EKG stat.

## 2013-06-07 NOTE — Telephone Encounter (Signed)
forwarded staff message to T.Nicole Kindred to schedule fu apt with Dr Lovena Le in next 7-10 days per Dr.branch

## 2013-06-11 ENCOUNTER — Encounter: Payer: Self-pay | Admitting: General Practice

## 2013-06-18 ENCOUNTER — Encounter: Payer: Self-pay | Admitting: Internal Medicine

## 2013-06-19 ENCOUNTER — Ambulatory Visit: Payer: Medicare Other

## 2013-06-26 ENCOUNTER — Ambulatory Visit: Payer: Medicare Other

## 2013-06-28 ENCOUNTER — Ambulatory Visit (INDEPENDENT_AMBULATORY_CARE_PROVIDER_SITE_OTHER): Payer: Medicare Other | Admitting: Internal Medicine

## 2013-06-28 ENCOUNTER — Encounter: Payer: Self-pay | Admitting: Internal Medicine

## 2013-06-28 VITALS — BP 110/46 | HR 49 | Ht 70.0 in | Wt 260.0 lb

## 2013-06-28 DIAGNOSIS — Z950 Presence of cardiac pacemaker: Secondary | ICD-10-CM

## 2013-06-28 DIAGNOSIS — I471 Supraventricular tachycardia: Secondary | ICD-10-CM

## 2013-06-28 DIAGNOSIS — I4892 Unspecified atrial flutter: Secondary | ICD-10-CM

## 2013-06-28 DIAGNOSIS — I498 Other specified cardiac arrhythmias: Secondary | ICD-10-CM

## 2013-06-28 DIAGNOSIS — I4891 Unspecified atrial fibrillation: Secondary | ICD-10-CM

## 2013-06-28 LAB — MDC_IDC_ENUM_SESS_TYPE_INCLINIC
Battery Voltage: 2.93 V
Brady Statistic RA Percent Paced: 30 %
Brady Statistic RV Percent Paced: 4.5 %
Date Time Interrogation Session: 20150320122720
Implantable Pulse Generator Model: 2210
Implantable Pulse Generator Serial Number: 7393982
Lead Channel Impedance Value: 537.5 Ohm
Lead Channel Pacing Threshold Pulse Width: 0.4 ms
Lead Channel Sensing Intrinsic Amplitude: 7 mV
Lead Channel Setting Pacing Pulse Width: 0.4 ms
Lead Channel Setting Sensing Sensitivity: 2 mV
MDC IDC MSMT BATTERY REMAINING LONGEVITY: 81.6 mo
MDC IDC MSMT LEADCHNL RA IMPEDANCE VALUE: 362.5 Ohm
MDC IDC MSMT LEADCHNL RA SENSING INTR AMPL: 2.6 mV
MDC IDC MSMT LEADCHNL RV PACING THRESHOLD AMPLITUDE: 0.75 V
MDC IDC SET LEADCHNL RA PACING AMPLITUDE: 1.625
MDC IDC SET LEADCHNL RV PACING AMPLITUDE: 2.5 V

## 2013-06-28 MED ORDER — FLECAINIDE ACETATE 100 MG PO TABS: 100.0000 mg | ORAL_TABLET | Freq: Two times a day (BID) | ORAL | Status: DC

## 2013-06-28 NOTE — Progress Notes (Signed)
HPI [name] is referred today for evaluation and  Management of atrial fibrillation and his PPM. He is a pleasant 63 yo man with a h/o PAF, who was found to have profound bradycardia on cardiac monitoring. He was tried on Multaq but could not tolerate this medication. He has rare palpitations. He denies chest pain. He has severe COPD and is on home oxygen. He has minimal cough. No hemoptysis. He has documented desaturation with exertion when he is not on oxygen.  Allergies  Allergen Reactions  . Nitroglycerin Hives, Swelling and Rash     Current Outpatient Prescriptions  Medication Sig Dispense Refill  . albuterol (PROVENTIL HFA;VENTOLIN HFA) 108 (90 BASE) MCG/ACT inhaler Inhale 2 puffs into the lungs every 6 (six) hours as needed for wheezing.      Marland Kitchen buPROPion (WELLBUTRIN SR) 150 MG 12 hr tablet Take 150 mg by mouth 2 (two) times daily.      . Canagliflozin (INVOKANA) 300 MG TABS Take 300 mg by mouth every morning.      . diazepam (VALIUM) 10 MG tablet Take 10 mg by mouth every 6 (six) hours as needed for anxiety.      Marland Kitchen diltiazem (CARDIZEM CD) 360 MG 24 hr capsule Take 1 capsule (360 mg total) by mouth every morning.  90 capsule  6  . fluticasone (FLONASE) 50 MCG/ACT nasal spray Place 1 spray into the nose daily.  16 g  0  . furosemide (LASIX) 20 MG tablet Take 2 tablets (40 mg total) by mouth every morning.  180 tablet  3  . HYDROcodone-acetaminophen (NORCO) 10-325 MG per tablet Take 1 tablet by mouth every 6 (six) hours as needed for pain.      Marland Kitchen insulin glargine (LANTUS) 100 units/mL SOLN Inject 50 Units into the skin 2 (two) times daily.       Marland Kitchen levothyroxine (SYNTHROID, LEVOTHROID) 50 MCG tablet Take 25 mcg by mouth daily before breakfast.       . metFORMIN (GLUCOPHAGE) 1000 MG tablet Take 1,000 mg by mouth 2 (two) times daily with a meal.      . metoprolol tartrate (LOPRESSOR) 50 MG tablet Take 1 tablet (50 mg total) by mouth 2 (two) times daily.  60 tablet  11  .  mometasone-formoterol (DULERA) 200-5 MCG/ACT AERO Inhale 2 puffs into the lungs 2 (two) times daily.      . Multiple Vitamin (MULTIVITAMIN) tablet Take 1 tablet by mouth daily.      . ondansetron (ZOFRAN) 4 MG tablet Take 1 tablet (4 mg total) by mouth every 8 (eight) hours as needed for nausea or vomiting.  40 tablet  1  . pantoprazole (PROTONIX) 40 MG tablet Take 40 mg by mouth every morning.      . rivaroxaban (XARELTO) 20 MG TABS tablet Take 1 tablet (20 mg total) by mouth daily with supper.  30 tablet  0  . simvastatin (ZOCOR) 20 MG tablet Take 20 mg by mouth every evening.      Marland Kitchen spironolactone (ALDACTONE) 50 MG tablet Take 2 tablets (100 mg total) by mouth daily.  180 tablet  3  . Vitamin D, Ergocalciferol, (DRISDOL) 50000 UNITS CAPS capsule Take 50,000 Units by mouth every 7 (seven) days. Takes on Fridays.       No current facility-administered medications for this visit.     Past Medical History  Diagnosis Date  . Hypertension   . Cirrhosis     Hep A and B immune  .  Depressed   . Varicose vein     of esophagus  . Difficult intubation     "trouble waking up afterwards" (01/06/2012)  . CHF (congestive heart failure) 01/06/2012  . Sinus pause 01/06/2012    5.2 seconds  . Anginal pain   . Sleep apnea     "don't wear mask" (01/06/2012)  . Emphysema   . Type II diabetes mellitus   . GERD (gastroesophageal reflux disease)   . H/O hiatal hernia   . Coughing up blood     "comes from my throat" (01/06/2012)  . Arthritis     "back; fingers" (01/06/2012)  . Chronic lower back pain   . Fatty liver disease, nonalcoholic   . CHB (complete heart block)   . Presence of permanent cardiac pacemaker 9/292013    [facility]    ROS:   All systems reviewed and negative except as noted in the HPI.   Past Surgical History  Procedure Laterality Date  . Back surgery    . Spinal cord stimulator implant  2006  . Cholecystectomy  1993  . Nasal septum surgery  1992  . Tonsillectomy and  adenoidectomy  1992  . Posterior fusion lumbar spine  1999    L4-5  . Lumbar disc surgery  1994; ~ 1995; ~ 1996  . Warthin's tumor excision  1990's    right  . Permanent pacemaker insertion  01/08/2012    CHB  . US echocardiography  12/28/2011    mild LVH,mild mitral annulara ca+,mild MR  . Nuclear stress test  10/19/2004    No ischemia  . Esophagogastroduodenoscopy  11/08/2004    NAT:FTDDUK esophageal erosions consistent with erosive reflux esophagitis/Areas of hemorrhage and nodularity of the fundal mucosa of uncertain significance, biopsied.  Small hiatal hernia, otherwise normal stomach  . Colonoscopy  11/08/2004    GUR:KYHCWC rectum, colon, TI.  Marland Kitchen Esophagogastroduodenoscopy  2010    Dr. Gala Romney: 3 columns Grade 1 varices, erosive esophagitis, HH, portal gastropathy, normal D1, D2  . Esophagogastroduodenoscopy (egd) with esophageal dilation N/A 02/14/2013    BJS:EGBTD 1 esophageal varices. Abnormal distal esophagus/status post biopsy after Maloney dilation. Portal gastropathy. Antral erosions-status post biopsy. path negative for H.pylori, benign path.     Family History  Problem Relation Age of Onset  . Stroke Brother   . Cancer Mother   . Arrhythmia Father      History   Social History  . Marital Status: Married    Spouse Name: N/A    Number of Children: N/A  . Years of Education: N/A   Occupational History  . Not on file.   Social History Main Topics  . Smoking status: Former Smoker -- 45 years    Types: Cigarettes  . Smokeless tobacco: Former Systems developer    Quit date: 11/13/2012     Comment: using Welbutrin and e-cigarettes-stopped smoking 12-03-12  . Alcohol Use: No     Comment: "quit alcohol 2011"  . Drug Use: No  . Sexual Activity: Not Currently   Other Topics Concern  . Not on file   Social History Narrative  . No narrative on file    BP 144/43, P - 60, R - 18  Physical Exam:  stable appearing middle aged man, wearing oxygen by nasal cannula, NAD HEENT:  Unremarkable Neck:  No JVD, no thyromegally Back:  No CVA tenderness Lungs:  Clear with no wheezes, rales, or rhonchi HEART:  Regular rate rhythm, no murmurs, no rubs, no clicks Abd:  soft, positive bowel  sounds, no organomegally, no rebound, no guarding Ext:  2 plus pulses, no edema, no cyanosis, no clubbing Skin:  No rashes no nodules Neuro:  CN II through XII intact, motor grossly intact   DEVICE  Normal device function.  See PaceArt for details.   Assess/Plan:

## 2013-06-28 NOTE — Patient Instructions (Addendum)
Your physician recommends that you schedule a follow-up appointment in: To be determined    Your physician has recommended you make the following change in your medication:  Start Flecainide 100 mg twice a day  Please come next Friday for EKG nurse visit.

## 2013-07-02 NOTE — Assessment & Plan Note (Signed)
His medtronic DDD PM is working normally. Will follow. 

## 2013-07-02 NOTE — Assessment & Plan Note (Addendum)
His symptoms are not well controlled. He appears to be in atrial flutter with an RVR. We will start flecainide and have him return in a few days for an ECG. He will remain on systemic anti-coagulation.

## 2013-07-03 ENCOUNTER — Ambulatory Visit: Payer: Medicare Other

## 2013-07-05 ENCOUNTER — Encounter: Payer: Self-pay | Admitting: Internal Medicine

## 2013-07-05 ENCOUNTER — Ambulatory Visit: Payer: Medicare Other

## 2013-07-05 ENCOUNTER — Encounter: Payer: Self-pay | Admitting: *Deleted

## 2013-07-05 ENCOUNTER — Ambulatory Visit (INDEPENDENT_AMBULATORY_CARE_PROVIDER_SITE_OTHER): Payer: Medicare Other | Admitting: Internal Medicine

## 2013-07-05 VITALS — BP 103/72 | HR 111

## 2013-07-05 DIAGNOSIS — I471 Supraventricular tachycardia: Secondary | ICD-10-CM

## 2013-07-05 DIAGNOSIS — I4891 Unspecified atrial fibrillation: Secondary | ICD-10-CM

## 2013-07-05 DIAGNOSIS — Z01812 Encounter for preprocedural laboratory examination: Secondary | ICD-10-CM

## 2013-07-05 DIAGNOSIS — I4892 Unspecified atrial flutter: Secondary | ICD-10-CM

## 2013-07-05 DIAGNOSIS — I498 Other specified cardiac arrhythmias: Secondary | ICD-10-CM

## 2013-07-05 LAB — BASIC METABOLIC PANEL
BUN: 28 mg/dL — AB (ref 6–23)
CO2: 25 mEq/L (ref 19–32)
Calcium: 9.5 mg/dL (ref 8.4–10.5)
Chloride: 99 mEq/L (ref 96–112)
Creat: 1.1 mg/dL (ref 0.50–1.35)
GLUCOSE: 131 mg/dL — AB (ref 70–99)
POTASSIUM: 4.6 meq/L (ref 3.5–5.3)
Sodium: 135 mEq/L (ref 135–145)

## 2013-07-05 LAB — MDC_IDC_ENUM_SESS_TYPE_INCLINIC
Battery Remaining Longevity: 63.6 mo
Brady Statistic RA Percent Paced: 0 %
Brady Statistic RV Percent Paced: 68 %
Implantable Pulse Generator Model: 2210
Lead Channel Impedance Value: 337.5 Ohm
Lead Channel Impedance Value: 525 Ohm
Lead Channel Pacing Threshold Pulse Width: 0.4 ms
Lead Channel Sensing Intrinsic Amplitude: 5.1 mV
Lead Channel Setting Pacing Amplitude: 5 V
Lead Channel Setting Pacing Pulse Width: 0.4 ms
Lead Channel Setting Sensing Sensitivity: 2 mV
MDC IDC MSMT BATTERY VOLTAGE: 2.92 V
MDC IDC MSMT LEADCHNL RA PACING THRESHOLD AMPLITUDE: 0 V
MDC IDC MSMT LEADCHNL RA PACING THRESHOLD PULSEWIDTH: 0.5 ms
MDC IDC MSMT LEADCHNL RA SENSING INTR AMPL: 2.1 mV
MDC IDC MSMT LEADCHNL RV PACING THRESHOLD AMPLITUDE: 0 V
MDC IDC PG SERIAL: 7393982
MDC IDC SESS DTM: 20150327110401
MDC IDC SET LEADCHNL RV PACING AMPLITUDE: 2.5 V

## 2013-07-05 LAB — CBC
HEMATOCRIT: 43.2 % (ref 39.0–52.0)
HEMOGLOBIN: 15 g/dL (ref 13.0–17.0)
MCH: 31.8 pg (ref 26.0–34.0)
MCHC: 34.7 g/dL (ref 30.0–36.0)
MCV: 91.7 fL (ref 78.0–100.0)
Platelets: 302 10*3/uL (ref 150–400)
RBC: 4.71 MIL/uL (ref 4.22–5.81)
RDW: 14.4 % (ref 11.5–15.5)
WBC: 9.5 10*3/uL (ref 4.0–10.5)

## 2013-07-05 NOTE — Progress Notes (Signed)
    HPI [name] returns today for followup. He is a pleasant 62 yo man with PAF, symptomatic bradycardia, s/p PPM insertion who was seen in our office several days ago and found to be in atrial flutter with an RVR. He was placed on flecainide. He returns today for followup. IN the interim, he has felt poorly with sob and he remains out of rhythm. He has been anti-coagulated. He has not had syncope. His dyspnea is worse.  Allergies  Allergen Reactions  . Nitroglycerin Hives, Swelling and Rash     Current Outpatient Prescriptions  Medication Sig Dispense Refill  . albuterol (PROVENTIL HFA;VENTOLIN HFA) 108 (90 BASE) MCG/ACT inhaler Inhale 2 puffs into the lungs every 6 (six) hours as needed for wheezing.      . buPROPion (WELLBUTRIN SR) 150 MG 12 hr tablet Take 150 mg by mouth 2 (two) times daily.      . Canagliflozin (INVOKANA) 300 MG TABS Take 300 mg by mouth every morning.      . diazepam (VALIUM) 10 MG tablet Take 10 mg by mouth every 6 (six) hours as needed for anxiety.      . diltiazem (CARDIZEM CD) 360 MG 24 hr capsule Take 1 capsule (360 mg total) by mouth every morning.  90 capsule  6  . flecainide (TAMBOCOR) 100 MG tablet Take 1 tablet (100 mg total) by mouth 2 (two) times daily.  90 tablet  3  . fluticasone (FLONASE) 50 MCG/ACT nasal spray Place 1 spray into the nose daily.  16 g  0  . furosemide (LASIX) 20 MG tablet Take 2 tablets (40 mg total) by mouth every morning.  180 tablet  3  . HYDROcodone-acetaminophen (NORCO) 10-325 MG per tablet Take 1 tablet by mouth every 6 (six) hours as needed for pain.      . insulin glargine (LANTUS) 100 units/mL SOLN Inject 50 Units into the skin 2 (two) times daily.       . levothyroxine (SYNTHROID, LEVOTHROID) 50 MCG tablet Take 25 mcg by mouth daily before breakfast.       . metFORMIN (GLUCOPHAGE) 1000 MG tablet Take 1,000 mg by mouth 2 (two) times daily with a meal.      . metoprolol tartrate (LOPRESSOR) 50 MG tablet Take 1 tablet (50 mg  total) by mouth 2 (two) times daily.  60 tablet  11  . mometasone-formoterol (DULERA) 200-5 MCG/ACT AERO Inhale 2 puffs into the lungs 2 (two) times daily.      . Multiple Vitamin (MULTIVITAMIN) tablet Take 1 tablet by mouth daily.      . ondansetron (ZOFRAN) 4 MG tablet Take 1 tablet (4 mg total) by mouth every 8 (eight) hours as needed for nausea or vomiting.  40 tablet  1  . pantoprazole (PROTONIX) 40 MG tablet Take 40 mg by mouth every morning.      . rivaroxaban (XARELTO) 20 MG TABS tablet Take 1 tablet (20 mg total) by mouth daily with supper.  30 tablet  0  . simvastatin (ZOCOR) 20 MG tablet Take 20 mg by mouth every evening.      . spironolactone (ALDACTONE) 50 MG tablet Take 2 tablets (100 mg total) by mouth daily.  180 tablet  3  . Vitamin D, Ergocalciferol, (DRISDOL) 50000 UNITS CAPS capsule Take 50,000 Units by mouth every 7 (seven) days. Takes on Fridays.       No current facility-administered medications for this visit.     Past Medical History    Diagnosis Date  . Hypertension   . Cirrhosis     Hep A and B immune  . Depressed   . Varicose vein     of esophagus  . Difficult intubation     "trouble waking up afterwards" (01/06/2012)  . CHF (congestive heart failure) 01/06/2012  . Sinus pause 01/06/2012    5.2 seconds  . Anginal pain   . Sleep apnea     "don't wear mask" (01/06/2012)  . Emphysema   . Type II diabetes mellitus   . GERD (gastroesophageal reflux disease)   . H/O hiatal hernia   . Coughing up blood     "comes from my throat" (01/06/2012)  . Arthritis     "back; fingers" (01/06/2012)  . Chronic lower back pain   . Fatty liver disease, nonalcoholic   . CHB (complete heart block)   . Presence of permanent cardiac pacemaker 9/292013    [facility]    ROS:   All systems reviewed and negative except as noted in the HPI.   Past Surgical History  Procedure Laterality Date  . Back surgery    . Spinal cord stimulator implant  2006  . Cholecystectomy  1993  .  Nasal septum surgery  1992  . Tonsillectomy and adenoidectomy  1992  . Posterior fusion lumbar spine  1999    L4-5  . Lumbar disc surgery  1994; ~ 1995; ~ 1996  . Warthin's tumor excision  1990's    right  . Permanent pacemaker insertion  01/08/2012    [facility]B  . US echocardiography  12/28/2011    mild LVH,mild mitral annulara ca+,mild MR  . Nuclear stress test  10/19/2004    No ischemia  . Esophagogastroduodenoscopy  11/08/2004    KY[facility]:CWCBJS esophageal erosions consistent with erosive reflux esophagitis/Areas of hemorrhage and nodularity of the fundal mucosa of uncertain significance, biopsied.  Small hiatal hernia, otherwise normal stomach  . Colonoscopy  11/08/2004    EGB:TDVVOH rectum, colon, TI.  Marland Kitchen Esophagogastroduodenoscopy  2010    Dr. Gala [name]: 3 columns Grade 1 varices, erosive esophagitis, HH, portal gastropathy, normal D1, D2  . Esophagogastroduodenoscopy (egd) with esophageal dilation N/A 02/14/2013    YWV:PXTGG 1 esophageal varices. Abnormal distal esophagus/status post biopsy after Maloney dilation. Portal gastropathy. Antral erosions-status post biopsy. path negative for H.pylori, benign path.     Family History  Problem Relation Age of Onset  . Stroke Brother   . Cancer Mother   . Arrhythmia Father      History   Social History  . Marital Status: Married    Spouse Name: N/A    Number of Children: N/A  . Years of Education: N/A   Occupational History  . Not on file.   Social History Main Topics  . Smoking status: Former Smoker -- 45 years    Types: Cigarettes  . Smokeless tobacco: Former Systems developer    Quit date: 11/13/2012     Comment: using Welbutrin and e-cigarettes-stopped smoking 12-03-12  . Alcohol Use: No     Comment: "quit alcohol 2011"  . Drug Use: No  . Sexual Activity: Not Currently   Other Topics Concern  . Not on file   Social History Narrative  . No narrative on file     There were no vitals taken for this visit.  Physical Exam:  Morbidly  obese appearing 63 yo man, NAD HEENT: Unremarkable Neck:  No JVD, no thyromegally Back:  No CVA tenderness Lungs:  Clear with no wheezes HEART:  Regular tachy rhythm, no murmurs, no rubs, no clicks Abd:  soft, positive bowel sounds, no organomegally, no rebound, no guarding Ext:  2 plus pulses, no edema, no cyanosis, no clubbing Skin:  No rashes no nodules Neuro:  CN II through XII intact, motor grossly intact  EKG - atrial flutter with a RVR  DEVICE  Normal device function.  See PaceArt for details. Patient remains out of rhythm  Assess/Plan: 

## 2013-07-05 NOTE — Patient Instructions (Signed)
Your physician recommends that you schedule a follow-up appointment in: To be determined  Your physician has recommended that you have an ablation. Catheter ablation is a medical procedure used to treat some cardiac arrhythmias (irregular heartbeats). During catheter ablation, a long, thin, flexible tube is put into a blood vessel in your groin (upper thigh), or neck. This tube is called an ablation catheter. It is then guided to your heart through the blood vessel. Radio frequency waves destroy small areas of heart tissue where abnormal heartbeats may cause an arrhythmia to start. Please see the instruction sheet given to you today.  Your physician recommends that you return for lab work today. PT/PI INR, CBC, BMET .

## 2013-07-05 NOTE — Assessment & Plan Note (Signed)
Persistant atrial flutter. I have recommended we proceed with catheter ablation of his atrial flutter. The risks/benefits/goals/expectation of the procedure have been discussed with the patient and he wishes to proceed with catheter ablation of atrial flutter. He will continue his current meds.

## 2013-07-06 LAB — APTT: aPTT: 34 seconds (ref 24–37)

## 2013-07-06 LAB — PROTIME-INR
INR: 1.17 (ref <1.50)
Prothrombin Time: 14.8 seconds (ref 11.6–15.2)

## 2013-07-08 ENCOUNTER — Encounter (HOSPITAL_COMMUNITY): Payer: Self-pay | Admitting: Pharmacy Technician

## 2013-07-08 ENCOUNTER — Telehealth: Payer: Self-pay | Admitting: *Deleted

## 2013-07-08 NOTE — Telephone Encounter (Signed)
Spoke to patient to inform him that Dr. Lovena Le advises the following regarding taking his medications for the 07/10/2013 procedure: 1) Patient to take 1/2 his insulin dose the night before/morning of the procedure, 2) Hold Metformin the morning of the procedure, and 3) Hold Invokana the morning of the procedure. Patient verbalized understanding and agreement. Also advised him to check his BS at least twice that morning prior to the procedure and notify MD if readings were elevated, low or he was symptomatic.

## 2013-07-08 NOTE — Telephone Encounter (Signed)
Patient notified (per his email request) regarding questions he had related to his upcoming procedure this week. Called patient and notified him that he is to report to the hospital on 07/10/2013 at 2:00 pm. Patient is diabetic and is on three medications related to his DM. Informed patient that since his procedure is later in the day and he is NPO after midnight, Dr. Lovena Le will be notified so as to determine if patient needs to "hold" any of his medications that morning prior to the procedure. Question routed to Dr. Lovena Le and [facility], [place].

## 2013-07-09 NOTE — Addendum Note (Signed)
Addended by: Truett Mainland on: 07/09/2013 09:32 AM   Modules accepted: Orders

## 2013-07-10 ENCOUNTER — Encounter (HOSPITAL_COMMUNITY)
Admission: RE | Disposition: A | Discharge status: Home / Self Care | Payer: Medicare Other | Source: Ambulatory Visit | Attending: Internal Medicine

## 2013-07-10 ENCOUNTER — Ambulatory Visit (HOSPITAL_COMMUNITY)
Admission: RE | Admit: 2013-07-10 | Discharge: 2013-07-11 | Disposition: A | Discharge status: Home / Self Care | Payer: Medicare Other | Source: Ambulatory Visit | Attending: Internal Medicine | Admitting: Internal Medicine

## 2013-07-10 DIAGNOSIS — D649 Anemia, unspecified: Secondary | ICD-10-CM

## 2013-07-10 DIAGNOSIS — F3289 Other specified depressive episodes: Secondary | ICD-10-CM | POA: Insufficient documentation

## 2013-07-10 DIAGNOSIS — R1013 Epigastric pain: Secondary | ICD-10-CM

## 2013-07-10 DIAGNOSIS — J441 Chronic obstructive pulmonary disease with (acute) exacerbation: Secondary | ICD-10-CM

## 2013-07-10 DIAGNOSIS — E119 Type 2 diabetes mellitus without complications: Secondary | ICD-10-CM

## 2013-07-10 DIAGNOSIS — E669 Obesity, unspecified: Secondary | ICD-10-CM

## 2013-07-10 DIAGNOSIS — I10 Essential (primary) hypertension: Secondary | ICD-10-CM | POA: Insufficient documentation

## 2013-07-10 DIAGNOSIS — I509 Heart failure, unspecified: Secondary | ICD-10-CM | POA: Insufficient documentation

## 2013-07-10 DIAGNOSIS — I498 Other specified cardiac arrhythmias: Secondary | ICD-10-CM | POA: Insufficient documentation

## 2013-07-10 DIAGNOSIS — R1011 Right upper quadrant pain: Secondary | ICD-10-CM

## 2013-07-10 DIAGNOSIS — R490 Dysphonia: Secondary | ICD-10-CM

## 2013-07-10 DIAGNOSIS — M545 Low back pain, unspecified: Secondary | ICD-10-CM | POA: Insufficient documentation

## 2013-07-10 DIAGNOSIS — K7460 Unspecified cirrhosis of liver: Secondary | ICD-10-CM

## 2013-07-10 DIAGNOSIS — K219 Gastro-esophageal reflux disease without esophagitis: Secondary | ICD-10-CM

## 2013-07-10 DIAGNOSIS — K2900 Acute gastritis without bleeding: Secondary | ICD-10-CM

## 2013-07-10 DIAGNOSIS — I442 Atrioventricular block, complete: Secondary | ICD-10-CM | POA: Insufficient documentation

## 2013-07-10 DIAGNOSIS — I5031 Acute diastolic (congestive) heart failure: Secondary | ICD-10-CM

## 2013-07-10 DIAGNOSIS — R109 Unspecified abdominal pain: Secondary | ICD-10-CM

## 2013-07-10 DIAGNOSIS — J449 Chronic obstructive pulmonary disease, unspecified: Secondary | ICD-10-CM

## 2013-07-10 DIAGNOSIS — F17200 Nicotine dependence, unspecified, uncomplicated: Secondary | ICD-10-CM

## 2013-07-10 DIAGNOSIS — M1990 Unspecified osteoarthritis, unspecified site: Secondary | ICD-10-CM

## 2013-07-10 DIAGNOSIS — Z7901 Long term (current) use of anticoagulants: Secondary | ICD-10-CM | POA: Insufficient documentation

## 2013-07-10 DIAGNOSIS — Z87891 Personal history of nicotine dependence: Secondary | ICD-10-CM | POA: Insufficient documentation

## 2013-07-10 DIAGNOSIS — J438 Other emphysema: Secondary | ICD-10-CM | POA: Insufficient documentation

## 2013-07-10 DIAGNOSIS — I471 Supraventricular tachycardia, unspecified: Secondary | ICD-10-CM

## 2013-07-10 DIAGNOSIS — G4733 Obstructive sleep apnea (adult) (pediatric): Secondary | ICD-10-CM | POA: Insufficient documentation

## 2013-07-10 DIAGNOSIS — R55 Syncope and collapse: Secondary | ICD-10-CM

## 2013-07-10 DIAGNOSIS — R1319 Other dysphagia: Secondary | ICD-10-CM

## 2013-07-10 DIAGNOSIS — I4892 Unspecified atrial flutter: Secondary | ICD-10-CM

## 2013-07-10 DIAGNOSIS — G4730 Sleep apnea, unspecified: Secondary | ICD-10-CM

## 2013-07-10 DIAGNOSIS — Z950 Presence of cardiac pacemaker: Secondary | ICD-10-CM

## 2013-07-10 DIAGNOSIS — R079 Chest pain, unspecified: Secondary | ICD-10-CM

## 2013-07-10 DIAGNOSIS — G8929 Other chronic pain: Secondary | ICD-10-CM | POA: Insufficient documentation

## 2013-07-10 DIAGNOSIS — I455 Other specified heart block: Secondary | ICD-10-CM

## 2013-07-10 DIAGNOSIS — F329 Major depressive disorder, single episode, unspecified: Secondary | ICD-10-CM | POA: Insufficient documentation

## 2013-07-10 DIAGNOSIS — I4891 Unspecified atrial fibrillation: Secondary | ICD-10-CM

## 2013-07-10 HISTORY — PX: ATRIAL FLUTTER ABLATION: SHX5733

## 2013-07-10 LAB — GLUCOSE, CAPILLARY
Glucose-Capillary: 158 mg/dL — ABNORMAL HIGH (ref 70–99)
Glucose-Capillary: 132 mg/dL — ABNORMAL HIGH (ref 70–99)
Glucose-Capillary: 244 mg/dL — ABNORMAL HIGH (ref 70–99)

## 2013-07-10 SURGERY — ATRIAL FLUTTER ABLATION
Anesthesia: LOCAL

## 2013-07-10 MED ORDER — METOPROLOL TARTRATE 50 MG PO TABS
50.0000 mg | ORAL_TABLET | Freq: Two times a day (BID) | ORAL | Status: DC
  Administered 2013-07-10: 50 mg via ORAL
  Filled 2013-07-10 (×3): qty 1

## 2013-07-10 MED ORDER — MOMETASONE FURO-FORMOTEROL FUM 200-5 MCG/ACT IN AERO
2.0000 | INHALATION_SPRAY | Freq: Two times a day (BID) | RESPIRATORY_TRACT | Status: DC
  Administered 2013-07-11: 2 via RESPIRATORY_TRACT
  Filled 2013-07-10 (×2): qty 8.8

## 2013-07-10 MED ORDER — BUPROPION HCL ER (SR) 150 MG PO TB12
150.0000 mg | ORAL_TABLET | Freq: Two times a day (BID) | ORAL | Status: DC
  Administered 2013-07-10: 150 mg via ORAL
  Filled 2013-07-10 (×3): qty 1

## 2013-07-10 MED ORDER — VITAMIN D (ERGOCALCIFEROL) 1.25 MG (50000 UNIT) PO CAPS: 50000.0000 [IU] | ORAL_CAPSULE | ORAL | Status: DC

## 2013-07-10 MED ORDER — SODIUM CHLORIDE 0.9 % IJ SOLN: 3.0000 mL | INTRAMUSCULAR | Status: DC | PRN

## 2013-07-10 MED ORDER — MIDAZOLAM HCL 5 MG/5ML IJ SOLN
INTRAMUSCULAR | Status: AC
  Filled 2013-07-10: qty 5

## 2013-07-10 MED ORDER — ONDANSETRON HCL 4 MG PO TABS: 4.0000 mg | ORAL_TABLET | Freq: Three times a day (TID) | ORAL | Status: DC | PRN

## 2013-07-10 MED ORDER — ALBUTEROL SULFATE (2.5 MG/3ML) 0.083% IN NEBU: 3.0000 mL | INHALATION_SOLUTION | Freq: Four times a day (QID) | RESPIRATORY_TRACT | Status: DC | PRN

## 2013-07-10 MED ORDER — PANTOPRAZOLE SODIUM 40 MG PO TBEC: 40.0000 mg | DELAYED_RELEASE_TABLET | ORAL | Status: DC

## 2013-07-10 MED ORDER — DILTIAZEM HCL ER COATED BEADS 360 MG PO CP24
360.0000 mg | ORAL_CAPSULE | Freq: Every morning | ORAL | Status: DC
  Filled 2013-07-10: qty 1

## 2013-07-10 MED ORDER — INSULIN GLARGINE 100 UNITS/ML SOLOSTAR PEN
50.0000 [IU] | PEN_INJECTOR | Freq: Two times a day (BID) | SUBCUTANEOUS | Status: DC
  Filled 2013-07-10: qty 3

## 2013-07-10 MED ORDER — FLECAINIDE ACETATE 100 MG PO TABS
100.0000 mg | ORAL_TABLET | Freq: Two times a day (BID) | ORAL | Status: DC
  Administered 2013-07-10: 100 mg via ORAL
  Filled 2013-07-10 (×3): qty 1

## 2013-07-10 MED ORDER — SODIUM CHLORIDE 0.9 % IV SOLN
INTRAVENOUS | Status: DC
  Administered 2013-07-10: 15:00:00 via INTRAVENOUS

## 2013-07-10 MED ORDER — ATORVASTATIN CALCIUM 10 MG PO TABS
10.0000 mg | ORAL_TABLET | Freq: Every day | ORAL | Status: DC
  Filled 2013-07-10: qty 1

## 2013-07-10 MED ORDER — SPIRONOLACTONE 100 MG PO TABS
100.0000 mg | ORAL_TABLET | Freq: Every day | ORAL | Status: DC
  Filled 2013-07-10: qty 1

## 2013-07-10 MED ORDER — HEPARIN (PORCINE) IN NACL 2-0.9 UNIT/ML-% IJ SOLN
INTRAMUSCULAR | Status: AC
  Filled 2013-07-10: qty 500

## 2013-07-10 MED ORDER — SIMVASTATIN 20 MG PO TABS: 20.0000 mg | ORAL_TABLET | Freq: Every evening | ORAL | Status: DC

## 2013-07-10 MED ORDER — BUPIVACAINE HCL (PF) 0.25 % IJ SOLN
INTRAMUSCULAR | Status: AC
  Filled 2013-07-10: qty 30

## 2013-07-10 MED ORDER — INSULIN GLARGINE 100 UNIT/ML ~~LOC~~ SOLN
50.0000 [IU] | Freq: Two times a day (BID) | SUBCUTANEOUS | Status: DC
  Administered 2013-07-10: 50 [IU] via SUBCUTANEOUS
  Filled 2013-07-10 (×3): qty 0.5

## 2013-07-10 MED ORDER — RIVAROXABAN 20 MG PO TABS
20.0000 mg | ORAL_TABLET | Freq: Every day | ORAL | Status: DC
  Administered 2013-07-10: 20 mg via ORAL
  Filled 2013-07-10 (×2): qty 1

## 2013-07-10 MED ORDER — FENTANYL CITRATE 0.05 MG/ML IJ SOLN
INTRAMUSCULAR | Status: AC
  Filled 2013-07-10: qty 2

## 2013-07-10 MED ORDER — SODIUM CHLORIDE 0.9 % IJ SOLN: 3.0000 mL | Freq: Two times a day (BID) | INTRAMUSCULAR | Status: DC

## 2013-07-10 MED ORDER — LEVOTHYROXINE SODIUM 50 MCG PO TABS
50.0000 ug | ORAL_TABLET | Freq: Every day | ORAL | Status: DC
  Filled 2013-07-10 (×3): qty 1

## 2013-07-10 MED ORDER — SODIUM CHLORIDE 0.9 % IV SOLN: 250.0000 mL | INTRAVENOUS | Status: DC | PRN

## 2013-07-10 MED ORDER — FUROSEMIDE 40 MG PO TABS
40.0000 mg | ORAL_TABLET | Freq: Every morning | ORAL | Status: DC
  Filled 2013-07-10: qty 1

## 2013-07-10 NOTE — H&P (View-Only) (Signed)
HPI Ralph [name] returns today for followup. He is a pleasant 63 yo man with PAF, symptomatic bradycardia, s/p PPM insertion who was seen in our office several days ago and found to be in atrial flutter with an RVR. He was placed on flecainide. He returns today for followup. IN the interim, he has felt poorly with sob and he remains out of rhythm. He has been anti-coagulated. He has not had syncope. His dyspnea is worse.  Allergies  Allergen Reactions  . Nitroglycerin Hives, Swelling and Rash     Current Outpatient Prescriptions  Medication Sig Dispense Refill  . albuterol (PROVENTIL HFA;VENTOLIN HFA) 108 (90 BASE) MCG/ACT inhaler Inhale 2 puffs into the lungs every 6 (six) hours as needed for wheezing.      Marland Kitchen buPROPion (WELLBUTRIN SR) 150 MG 12 hr tablet Take 150 mg by mouth 2 (two) times daily.      . Canagliflozin (INVOKANA) 300 MG TABS Take 300 mg by mouth every morning.      . diazepam (VALIUM) 10 MG tablet Take 10 mg by mouth every 6 (six) hours as needed for anxiety.      Marland Kitchen diltiazem (CARDIZEM CD) 360 MG 24 hr capsule Take 1 capsule (360 mg total) by mouth every morning.  90 capsule  6  . flecainide (TAMBOCOR) 100 MG tablet Take 1 tablet (100 mg total) by mouth 2 (two) times daily.  90 tablet  3  . fluticasone (FLONASE) 50 MCG/ACT nasal spray Place 1 spray into the nose daily.  16 g  0  . furosemide (LASIX) 20 MG tablet Take 2 tablets (40 mg total) by mouth every morning.  180 tablet  3  . HYDROcodone-acetaminophen (NORCO) 10-325 MG per tablet Take 1 tablet by mouth every 6 (six) hours as needed for pain.      Marland Kitchen insulin glargine (LANTUS) 100 units/mL SOLN Inject 50 Units into the skin 2 (two) times daily.       Marland Kitchen levothyroxine (SYNTHROID, LEVOTHROID) 50 MCG tablet Take 25 mcg by mouth daily before breakfast.       . metFORMIN (GLUCOPHAGE) 1000 MG tablet Take 1,000 mg by mouth 2 (two) times daily with a meal.      . metoprolol tartrate (LOPRESSOR) 50 MG tablet Take 1 tablet (50 mg  total) by mouth 2 (two) times daily.  60 tablet  11  . mometasone-formoterol (DULERA) 200-5 MCG/ACT AERO Inhale 2 puffs into the lungs 2 (two) times daily.      . Multiple Vitamin (MULTIVITAMIN) tablet Take 1 tablet by mouth daily.      . ondansetron (ZOFRAN) 4 MG tablet Take 1 tablet (4 mg total) by mouth every 8 (eight) hours as needed for nausea or vomiting.  40 tablet  1  . pantoprazole (PROTONIX) 40 MG tablet Take 40 mg by mouth every morning.      . rivaroxaban (XARELTO) 20 MG TABS tablet Take 1 tablet (20 mg total) by mouth daily with supper.  30 tablet  0  . simvastatin (ZOCOR) 20 MG tablet Take 20 mg by mouth every evening.      Marland Kitchen spironolactone (ALDACTONE) 50 MG tablet Take 2 tablets (100 mg total) by mouth daily.  180 tablet  3  . Vitamin D, Ergocalciferol, (DRISDOL) 50000 UNITS CAPS capsule Take 50,000 Units by mouth every 7 (seven) days. Takes on Fridays.       No current facility-administered medications for this visit.     Past Medical History  Diagnosis Date  . Hypertension   . Cirrhosis     Hep A and B immune  . Depressed   . Varicose vein     of esophagus  . Difficult intubation     "trouble waking up afterwards" (01/06/2012)  . CHF (congestive heart failure) 01/06/2012  . Sinus pause 01/06/2012    5.2 seconds  . Anginal pain   . Sleep apnea     "don't wear mask" (01/06/2012)  . Emphysema   . Type II diabetes mellitus   . GERD (gastroesophageal reflux disease)   . H/O hiatal hernia   . Coughing up blood     "comes from my throat" (01/06/2012)  . Arthritis     "back; fingers" (01/06/2012)  . Chronic lower back pain   . Fatty liver disease, nonalcoholic   . CHB (complete heart block)   . Presence of permanent cardiac pacemaker 9/292013    [facility]    ROS:   All systems reviewed and negative except as noted in the HPI.   Past Surgical History  Procedure Laterality Date  . Back surgery    . Spinal cord stimulator implant  2006  . Cholecystectomy  1993  .  Nasal septum surgery  1992  . Tonsillectomy and adenoidectomy  1992  . Posterior fusion lumbar spine  1999    L4-5  . Lumbar disc surgery  1994; ~ 1995; ~ 1996  . Warthin's tumor excision  1990's    right  . Permanent pacemaker insertion  01/08/2012    CHB  . US echocardiography  12/28/2011    mild LVH,mild mitral annulara ca+,mild MR  . Nuclear stress test  10/19/2004    No ischemia  . Esophagogastroduodenoscopy  11/08/2004    KYH:CWCBJS esophageal erosions consistent with erosive reflux esophagitis/Areas of hemorrhage and nodularity of the fundal mucosa of uncertain significance, biopsied.  Small hiatal hernia, otherwise normal stomach  . Colonoscopy  11/08/2004    EGB:TDVVOH rectum, colon, TI.  Marland Kitchen Esophagogastroduodenoscopy  2010    Dr. Gala Romney: 3 columns Grade 1 varices, erosive esophagitis, HH, portal gastropathy, normal D1, D2  . Esophagogastroduodenoscopy (egd) with esophageal dilation N/A 02/14/2013    YWV:PXTGG 1 esophageal varices. Abnormal distal esophagus/status post biopsy after Maloney dilation. Portal gastropathy. Antral erosions-status post biopsy. path negative for H.pylori, benign path.     Family History  Problem Relation Age of Onset  . Stroke Brother   . Cancer Mother   . Arrhythmia Father      History   Social History  . Marital Status: Married    Spouse Name: N/A    Number of Children: N/A  . Years of Education: N/A   Occupational History  . Not on file.   Social History Main Topics  . Smoking status: Former Smoker -- 45 years    Types: Cigarettes  . Smokeless tobacco: Former Systems developer    Quit date: 11/13/2012     Comment: using Welbutrin and e-cigarettes-stopped smoking 12-03-12  . Alcohol Use: No     Comment: "quit alcohol 2011"  . Drug Use: No  . Sexual Activity: Not Currently   Other Topics Concern  . Not on file   Social History Narrative  . No narrative on file     There were no vitals taken for this visit.  Physical Exam:  Morbidly  obese appearing 63 yo man, NAD HEENT: Unremarkable Neck:  No JVD, no thyromegally Back:  No CVA tenderness Lungs:  Clear with no wheezes HEART:  Regular tachy rhythm, no murmurs, no rubs, no clicks Abd:  soft, positive bowel sounds, no organomegally, no rebound, no guarding Ext:  2 plus pulses, no edema, no cyanosis, no clubbing Skin:  No rashes no nodules Neuro:  CN II through XII intact, motor grossly intact  EKG - atrial flutter with a RVR  DEVICE  Normal device function.  See PaceArt for details. Patient remains out of rhythm  Assess/Plan:

## 2013-07-10 NOTE — Interval H&P Note (Signed)
History and Physical Interval Note:  07/10/2013 4:28 PM  [name]  has presented today for surgery, with the diagnosis of A Flutter  The various methods of treatment have been discussed with the patient and family. After consideration of risks, benefits and other options for treatment, the patient has consented to  Procedure(s): ATRIAL FLUTTER ABLATION (N/A) as a surgical intervention .  The patient's history has been reviewed, patient examined, no change in status, stable for surgery.  I have reviewed the patient's chart and labs.  Questions were answered to the patient's satisfaction.     Ralph [name]

## 2013-07-10 NOTE — CV Procedure (Signed)
EPS/RFA of atrial flutter without immediate complication. Q#[id number]

## 2013-07-10 NOTE — Op Note (Signed)
NAMEMarland Kitchen  Ralph [name], Ralph [name] NO.:  [id number]  MEDICAL RECORD NO.:  [id number]  LOCATION:  3W35C                        FACILITY:  [facility]  PHYSICIAN:  Champ Mungo. Lovena Le, MD    DATE OF BIRTH:  1951/02/14  DATE OF PROCEDURE:  07/10/2013 DATE OF DISCHARGE:                              OPERATIVE REPORT   PROCEDURE PERFORMED:  Leads electrophysiologic study and RF catheter ablation of atrial flutter.  INTRODUCTION:  The patient is a very pleasant 64 year old man with a longstanding history of typical atrial flutter.  He has until recently not been particularly symptomatic but has developed increasingly frequent symptoms of shortness of breath and fatigue, and he is already anticoagulated and now presents for catheter ablation.  DESCRIPTION OF PROCEDURE:  After informed was obtained, the patient was taken to the diagnostic EP lab in a fasting state.  After usual preparation and draping, intravenous fentanyl and midazolam was given for sedation.  A 6-French Octapolar catheter was inserted percutaneously in the right femoral vein and advanced to coronary sinus.  A 6-French quadripolar catheter was inserted percutaneously in the right femoral vein and advanced to the His bundle region.  Initial mapping demonstrated proximal with distal atrial activation in the coronary sinus.  A 7-French 8 mm ablation catheter was inserted percutaneously into the right femoral vein and advanced into the right atrium where mapping of the patient's atrial flutter was carried out.  Pacing 30 milliseconds faster than the atrial flutter cycle length was carried out demonstrating a stimulation to atrial activation time equal to the atrial flutter circuit.  This confirmed a diagnosis of tricuspid valvular reentrant atrial flutter.  The ablation catheter was then maneuvered into the atrial flutter isthmus.  A 7 RF energy applications were delivered.  During the second RF energy application, the  patient returned to sinus rhythm.  Pacing demonstrated intact atrial flutter isthmus conduction.  Third RF energy application was delivered, resulting in atrial flutter isthmus block as demonstrated by a local stimulus to activation time of 160 milliseconds.  A 4 bonus RF energy applications were then delivered and the patient was observed for 30 minutes.  During this time, rapid ventricular pacing was carried out from the right ventricle demonstrating a VA Wenckebach cycle length of 360 milliseconds.  Programed ventricular stimulation was carried out from the right ventricle at an S1, S2 interval of 500/440 and the S2 interval stepwise decrement down to 360 with retrograde AV node or ERP was observed.  During programed ventricular stimulation, rapid ventricular pacing, the atrial activation remained midline and decremental.  Next, programed atrial stimulation was carried out from the atrium at a pacing cycle length of 500 milliseconds and the S1-S2 interval stepwise decreased down to 380 milliseconds with AV node ERP was observed.  During programed atrial stimulation, there were no AH jumps, no echo beats, and no inducible SVT.  Finally, rapid atrial pacing was carried out from the atrium at a pacing cycle length of 500 milliseconds and stepwise decreased down to 420 milliseconds where AV Wenckebach was observed.  During rapid atrial pacing, the PR interval was less than the RR interval and there was no inducible SVT.  At this  point, the catheters were removed.  Hemostasis was assured and the patient was returned to his room in satisfactory condition.  COMPLICATIONS:  There were no immediate procedure complications.  RESULTS:  A.  Baseline ECG.  Baseline ECG demonstrates atrial flutter with a controlled ventricular response. B.  Baseline intervals.  The atrial flutter cycle length was 270 milliseconds.  The HV interval was 59 milliseconds and the QRS duration was 130 milliseconds. C.   Rapid ventricular pacing.  Following ablation, rapid ventricular pacing was carried out from the right ventricle demonstrating a VA Wenckebach cycle length of 360 milliseconds.  During rapid ventricular pacing, the atrial activation was midline and decremental. D.  Programed ventricular stimulation.  Programed ventricular stimulation was carried out from the right ventricle at base drive cycle length of 500 milliseconds.  The S1-S2 interval stepwise decreased from 440 milliseconds down to 360 milliseconds with retrograde AV node ERP was observed.  During programed ventricular stimulation, the atrial activation was midline and decremental. E.  Programed atrial stimulation.  Programed atrial stimulation was carried out following ablation, at a base drive cycle length of 500 milliseconds.  The S1-S2 interval stepwise decreased down to 380 milliseconds with AV node ERP was observed.  During programed atrial stimulation, the atrial activation sequence was midline decremental. F.  Rapid atrial pacing.  Rapid atrial pacing was carried out from the atrium at a base drive cycle length of 500 milliseconds and stepwise decreased down to 420 milliseconds where AV Wenckebach was observed. During rapid atrial pacing, the PR interval was less than RR interval and there was no inducible SVT. G.  Arrhythmias observed. 1. Atrial flutter initiation present at the time of EP study.  The     duration was sustained.  Cycle length was 270 milliseconds and the     method of termination was with catheter ablation.     a.     Mapping.  Mapping of atrial flutter.  This was demonstrated      normal size and orientation.     b.     RF energy application.  A total of 7 RF energy applications      were delivered.  During the second RF energy application, atrial      flutter was terminated and sinus rhythm was restored.  During the      third RF energy application, atrial flutter isthmus block was      demonstrated.   Four Bonus RF energy applications were delivered.  CONCLUSION:  This study demonstrates successful electrophysiologic study and RF catheter ablation of atrial flutter with a total of 7 RF energy applications delivered to the atrial flutter isthmus resulted in termination of atrial flutter, restoration of sinus rhythm, and creation of bidirectional block and atrial flutter isthmus.     Champ Mungo. Lovena Le, MD     GWT/MEDQ  D:  07/10/2013  T:  07/10/2013  Job:  [id number]

## 2013-07-11 DIAGNOSIS — I4892 Unspecified atrial flutter: Secondary | ICD-10-CM

## 2013-07-11 NOTE — Progress Notes (Signed)
UR Completed Huberta Tompkins Graves-Bigelow, RN,BSN [phone]  

## 2013-07-11 NOTE — Discharge Instructions (Signed)
No driving for 3 days. No lifting over 5 lbs for 1 week. No sexual activity for 1 week. Keep procedure site clean & dry. If you notice increased pain, swelling, bleeding or pus, call/return! You may shower, but no soaking baths/hot tubs/pools for 1 week. ° °

## 2013-07-11 NOTE — Discharge Summary (Signed)
ELECTROPHYSIOLOGY DISCHARGE SUMMARY   Patient ID: Ralph [name],  MRN: [id number], DOB/AGE: [birth year] 63 y.o.  Admit date: 07/10/2013 Discharge date: 07/11/2013  Primary Care Physician: Elsie Lincoln, MD  Primary Cardiologist: Carlyle Dolly, MD Primary EP: Cristopher Peru, MD  Primary Discharge Diagnosis:  1. Atrial flutter s/p EPS +RF ablation of typical atrial flutter  Secondary Discharge Diagnoses:  1. Symptomatic bradycardia s/p PPM implantation  2. Paroxysmal atrial fibrillation 3. HTN 4. DM 5. OSA 6. Cirrhosis 7. COPD  Procedures This Admission:  1. EP study +RF ablation of typical atrial flutter RESULTS: A. Baseline ECG. Baseline ECG demonstrates atrial flutter  with a controlled ventricular response.  B. Baseline intervals. The atrial flutter cycle length was 270  milliseconds. The HV interval was 59 milliseconds and the QRS duration  was 130 milliseconds.  C. Rapid ventricular pacing. Following ablation, rapid ventricular  pacing was carried out from the right ventricle demonstrating a VA  Wenckebach cycle length of 360 milliseconds. During rapid ventricular  pacing, the atrial activation was midline and decremental.  D. Programed ventricular stimulation. Programed ventricular  stimulation was carried out from the right ventricle at base drive cycle  length of 500 milliseconds. The S1-S2 interval stepwise decreased from  440 milliseconds down to 360 milliseconds with retrograde AV node ERP  was observed. During programed ventricular stimulation, the atrial  activation was midline and decremental.  E. Programed atrial stimulation. Programed atrial stimulation was  carried out following ablation, at a base drive cycle length of 500  milliseconds. The S1-S2 interval stepwise decreased down to 380  milliseconds with AV node ERP was observed. During programed atrial  stimulation, the atrial activation sequence was midline decremental.  F. Rapid atrial pacing. Rapid  atrial pacing was carried out from the  atrium at a base drive cycle length of 500 milliseconds and stepwise  decreased down to 420 milliseconds where AV Wenckebach was observed.  During rapid atrial pacing, the PR interval was less than RR interval  and there was no inducible SVT.  G. Arrhythmias observed.  1. Atrial flutter initiation present at the time of EP study. The  duration was sustained. Cycle length was 270 milliseconds and the  method of termination was with catheter ablation.  a. Mapping. Mapping of atrial flutter. This was demonstrated  normal size and orientation.  b. RF energy application. A total of 7 RF energy applications  were delivered. During the second RF energy application, atrial  flutter was terminated and sinus rhythm was restored. During the  third RF energy application, atrial flutter isthmus block was  demonstrated. Four Bonus RF energy applications were delivered.  CONCLUSION: This study demonstrates successful electrophysiologic study  and RF catheter ablation of atrial flutter with a total of 7 RF energy  applications delivered to the atrial flutter isthmus resulted in  termination of atrial flutter, restoration of sinus rhythm, and creation  of bidirectional block and atrial flutter isthmus.   History and Hospital Course:  Ralph [name] is a 63 year old man with symptomatic bradycardia s/p PPM implantation, PAF and atrial flutter. He has not been particularly symptomatic until recently as he has developed increasingly frequent recurrence of atrial flutter with symptoms of shortness of breath and fatigue. He is adequately anticoagulated. He presented yesterday 07/10/2013 for EPS +RF ablation of typical atrial flutter. This was successful. Please see details as outlined above. Ralph [name] tolerated this procedure well without any immediate complication. He remains hemodynamically stable and afebrile. His groin site is intact without  significant bleeding or hematoma.  He has been given discharge instructions including wound care and activity restrictions. There were no changes made to his medications. He will follow-up in clinic in 4-6 weeks. He has been seen, examined and deemed stable for discharge today by Dr. Cristopher Peru.  Physcial Exam: Vitals: Blood pressure 121/69, pulse 71, temperature 98.4 F (36.9 C), temperature source Oral, resp. rate 19, height 5\' 10"  (1.778 m), weight 255 lb (115.667 kg), SpO2 92.00%.  General: Well developed, well appearing 63 year old male in no acute distress.  Neck: Supple. JVD not elevated.  Lungs: Clear bilaterally to auscultation without wheezes, rales, or rhonchi. Breathing is unlabored.  Heart: Regular S1 S2 without murmurs, rubs, or gallops.  Abdomen: Soft, non-distended.  Extremities: No clubbing or cyanosis. No edema. Distal pedal pulses are 2+ and equal bilaterally. Right groin intact without hematoma.  Neuro: Alert and oriented X 3. Moves all extremities spontaneously. No focal deficits.   Labs: Lab Results  Component Value Date   WBC 9.5 07/05/2013   HGB 15.0 07/05/2013   HCT 43.2 07/05/2013   MCV 91.7 07/05/2013   PLT 302 07/05/2013     Recent Labs Lab 07/05/13 1147  NA 135  K 4.6  CL 99  CO2 25  BUN 28*  CREATININE 1.10  CALCIUM 9.5  GLUCOSE 131*    Disposition:  The patient is being discharged in stable condition.  Follow-up:     Follow-up Information   Follow up with [facility] 08/19/[facility]. (At 9:00 AM with Dr. Lovena Le)    Specialty:  Cardiology   Contact information:   [place] [address] [phone]     Discharge Medications:    Medication List         albuterol 108 (90 BASE) MCG/ACT inhaler  Commonly known as:  PROVENTIL HFA;VENTOLIN HFA  Inhale 2 puffs into the lungs every 6 (six) hours as needed for wheezing.     buPROPion 150 MG 12 hr tablet  Commonly known as:  WELLBUTRIN SR  Take 150 mg by mouth 2 (two) times daily.     diazepam 10 MG  tablet  Commonly known as:  VALIUM  Take 10 mg by mouth every 6 (six) hours as needed for anxiety.     diltiazem 360 MG 24 hr capsule  Commonly known as:  CARDIZEM CD  Take 1 capsule (360 mg total) by mouth every morning.     DULERA 200-5 MCG/ACT Aero  Generic drug:  mometasone-formoterol  Inhale 2 puffs into the lungs 2 (two) times daily.     flecainide 100 MG tablet  Commonly known as:  TAMBOCOR  Take 1 tablet (100 mg total) by mouth 2 (two) times daily.     fluticasone 50 MCG/ACT nasal spray  Commonly known as:  FLONASE  Place 1 spray into the nose daily.     furosemide 20 MG tablet  Commonly known as:  LASIX  Take 2 tablets (40 mg total) by mouth every morning.     HYDROcodone-acetaminophen 10-325 MG per tablet  Commonly known as:  NORCO  Take 1 tablet by mouth every 6 (six) hours as needed for pain.     insulin glargine 100 unit/mL Sopn  Commonly known as:  LANTUS  Inject 50 Units into the skin 2 (two) times daily.     INVOKANA 300 MG Tabs  Generic drug:  Canagliflozin  Take 300 mg by mouth daily.     levothyroxine 50 MCG  tablet  Commonly known as:  SYNTHROID, LEVOTHROID  Take 50 mcg by mouth daily before breakfast.     metFORMIN 1000 MG tablet  Commonly known as:  GLUCOPHAGE  Take 1,000 mg by mouth 2 (two) times daily with a meal.     metoprolol 50 MG tablet  Commonly known as:  LOPRESSOR  Take 1 tablet (50 mg total) by mouth 2 (two) times daily.     multivitamin tablet  Take 1 tablet by mouth daily.     ondansetron 4 MG tablet  Commonly known as:  ZOFRAN  Take 1 tablet (4 mg total) by mouth every 8 (eight) hours as needed for nausea or vomiting.     pantoprazole 40 MG tablet  Commonly known as:  PROTONIX  Take 40 mg by mouth every morning.     Rivaroxaban 20 MG Tabs tablet  Commonly known as:  XARELTO  Take 1 tablet (20 mg total) by mouth daily with supper.     simvastatin 20 MG tablet  Commonly known as:  ZOCOR  Take 20 mg by mouth every  evening.     spironolactone 50 MG tablet  Commonly known as:  ALDACTONE  Take 2 tablets (100 mg total) by mouth daily.     Vitamin D (Ergocalciferol) 50000 UNITS Caps capsule  Commonly known as:  DRISDOL  Take 50,000 Units by mouth every 7 (seven) days. Takes on Fridays.       Duration of Discharge Encounter: Greater than 30 minutes including physician time.  Signed, Ileene Hutchinson, PA-C 07/11/2013, 9:02 AM  EP Attending  Patient seen and examined. Agree with above. He is doing well after catheter ablation of atrial flutter. [place] for discharge. Continue current meds.   Mikle Bosworth.D.

## 2013-07-15 ENCOUNTER — Encounter: Payer: Self-pay | Admitting: Gastroenterology

## 2013-07-15 ENCOUNTER — Ambulatory Visit (INDEPENDENT_AMBULATORY_CARE_PROVIDER_SITE_OTHER): Payer: Medicare Other | Admitting: Gastroenterology

## 2013-07-15 VITALS — BP 122/68 | HR 78 | Temp 97.6°F | Ht 70.0 in | Wt 256.4 lb

## 2013-07-15 DIAGNOSIS — K7460 Unspecified cirrhosis of liver: Secondary | ICD-10-CM

## 2013-07-15 LAB — GLUCOSE, CAPILLARY: Glucose-Capillary: 142 mg/dL — ABNORMAL HIGH (ref 70–99)

## 2013-07-15 NOTE — Progress Notes (Signed)
Primary Care Physician: Leonides Grills, MD  Primary Gastroenterologist:  Garfield Cornea, MD   Chief Complaint  Patient presents with  . Follow-up    HPI: Ralph [name] is a 63 y.o. male here for followup. He was last seen in her office back in January 2015 for followup of likely [place] cirrhosis, EGD. Discharge from hospital last week after undergoing RF catheter ablation of atrial flutter.  History of weakly positive ASMA but otherwise unremarkable extensive workup. He is due for ultrasound of the abdomen July 2015. Next colonoscopy due in 2016.  After last office visit he had a CT abdomen and pelvis in January 2015. Noted to have mild diffuse fatty liver, small left inguinal herniation of mesenteric fat.  BM ok. Abdominal bloating less of problem. Urinating well. No melena, brbpr. No episodes of confusion. Some fatigue but feels related to recent heart irregularity. Doing better post ablation for atrial flutter. Dyspnea on exertion has improved. Chronic cough resolved after stopped smoking in December. His weight is down over 12 pounds since January felt to be related to diuresis. He is now seeing [name] to better manage his diabetes and feels like he is doing better. Denies any pruritus.   Current Outpatient Prescriptions  Medication Sig Dispense Refill  . albuterol (PROVENTIL HFA;VENTOLIN HFA) 108 (90 BASE) MCG/ACT inhaler Inhale 2 puffs into the lungs every 6 (six) hours as needed for wheezing.      Marland Kitchen buPROPion (WELLBUTRIN SR) 150 MG 12 hr tablet Take 150 mg by mouth 2 (two) times daily.      . Canagliflozin (INVOKANA) 300 MG TABS Take 300 mg by mouth daily.       . diazepam (VALIUM) 10 MG tablet Take 10 mg by mouth every 6 (six) hours as needed for anxiety.      Marland Kitchen diltiazem (CARDIZEM CD) 360 MG 24 hr capsule Take 1 capsule (360 mg total) by mouth every morning.  90 capsule  6  . flecainide (TAMBOCOR) 100 MG tablet Take 1 tablet (100 mg total) by mouth 2 (two) times daily.   90 tablet  3  . furosemide (LASIX) 20 MG tablet Take 2 tablets (40 mg total) by mouth every morning.  180 tablet  3  . HYDROcodone-acetaminophen (NORCO) 10-325 MG per tablet Take 1 tablet by mouth every 6 (six) hours as needed for pain.      Marland Kitchen insulin glargine (LANTUS) 100 units/mL SOLN Inject 50 Units into the skin 2 (two) times daily.       Marland Kitchen levothyroxine (SYNTHROID, LEVOTHROID) 50 MCG tablet Take 50 mcg by mouth daily before breakfast.       . metFORMIN (GLUCOPHAGE) 1000 MG tablet Take 1,000 mg by mouth 2 (two) times daily with a meal.      . metoprolol tartrate (LOPRESSOR) 50 MG tablet Take 1 tablet (50 mg total) by mouth 2 (two) times daily.  60 tablet  11  . mometasone-formoterol (DULERA) 200-5 MCG/ACT AERO Inhale 2 puffs into the lungs 2 (two) times daily.      . Multiple Vitamin (MULTIVITAMIN) tablet Take 1 tablet by mouth daily.      . ondansetron (ZOFRAN) 4 MG tablet Take 1 tablet (4 mg total) by mouth every 8 (eight) hours as needed for nausea or vomiting.  40 tablet  1  . pantoprazole (PROTONIX) 40 MG tablet Take 40 mg by mouth every morning.      . rivaroxaban (XARELTO) 20 MG TABS tablet Take 1 tablet (  20 mg total) by mouth daily with supper.  30 tablet  0  . simvastatin (ZOCOR) 20 MG tablet Take 20 mg by mouth every evening.      Marland Kitchen spironolactone (ALDACTONE) 50 MG tablet Take 2 tablets (100 mg total) by mouth daily.  180 tablet  3  . Vitamin D, Ergocalciferol, (DRISDOL) 50000 UNITS CAPS capsule Take 50,000 Units by mouth every 7 (seven) days. Takes on Fridays.       No current facility-administered medications for this visit.    Allergies as of 07/15/2013 - Review Complete 07/15/2013  Allergen Reaction Noted  . Nitroglycerin Hives, Swelling, and Rash     ROS:  General: Negative for anorexia, weight loss, fever, chills, fatigue, weakness. ENT: Negative for hoarseness, difficulty swallowing , nasal congestion. CV: See history of present illness  Respiratory: Negative for  dyspnea at rest, dyspnea on exertion, cough, sputum, wheezing.  GI: See history of present illness. GU:  Negative for dysuria, hematuria, urinary incontinence, urinary frequency, nocturnal urination.  Endo: Negative for unusual weight change.    Physical Examination:   BP 122/68  Pulse 78  Temp(Src) 97.6 F (36.4 C) (Oral)  Ht 5\' 10"  (1.778 m)  Wt 256 lb 6.4 oz (116.302 kg)  BMI 36.79 kg/m2  General: Chronically ill-appearing well-developed in no acute distress. O2 via nasal cannula Eyes: No icterus. Mouth: Oropharyngeal mucosa moist and pink , no lesions erythema or exudate. Lungs: Clear to auscultation bilaterally.  Heart: Regular rate and rhythm, no murmurs rubs or gallops.  Abdomen: Bowel sounds are normal, mild epigastric tenderness, nondistended, no hepatosplenomegaly or masses, no abdominal bruits or hernia , no rebound or guarding.   Extremities: trace to 1+ lower extremity edema. No clubbing or deformities. Neuro: Alert and oriented x 4   Skin: Warm and dry, no jaundice.   Psych: Alert and cooperative, normal mood and affect.  Labs:  Lab Results  Component Value Date   WBC 9.5 07/05/2013   HGB 15.0 07/05/2013   HCT 43.2 07/05/2013   MCV 91.7 07/05/2013   PLT 302 07/05/2013   Lab Results  Component Value Date   CREATININE 1.10 07/05/2013   BUN 28* 07/05/2013   NA 135 07/05/2013   K 4.6 07/05/2013   CL 99 07/05/2013   CO2 25 07/05/2013     Imaging Studies: No results found.

## 2013-07-15 NOTE — Patient Instructions (Signed)
1. I will request your labs from [name]. If he did not check your liver numbers, we will let you know. 2. Office visit in 10/2013.

## 2013-07-15 NOTE — Progress Notes (Signed)
cc'd to pcp 

## 2013-07-15 NOTE — Assessment & Plan Note (Signed)
Suspected [place] cirrhosis. Due for LFTs if not recently done by [name]. We will call and check. He is up-to-date on hepatoma surveillance. His lower extremity edema has improved. Less abdominal discomfort. Denies any significant nausea or vomiting. Overall feeling better. Continue current diuretic management. 2 g sodium restricted diet. Followup in July 2015 for office visit and at that time we'll schedule hepatoma surveillance. Call sooner if needed.

## 2013-07-19 ENCOUNTER — Encounter: Payer: Self-pay | Admitting: Cardiology

## 2013-08-02 NOTE — Progress Notes (Signed)
Labs from [name] 05/2013 HgbA1C: 8.3 Tbili0.7, AP 190H, AST 28, ALT 55H, alb 4.2. AP improved.  Keep OV in 10/2013.

## 2013-08-15 ENCOUNTER — Encounter: Payer: Self-pay | Admitting: Gastroenterology

## 2013-08-16 ENCOUNTER — Encounter: Payer: Self-pay | Admitting: Internal Medicine

## 2013-08-19 ENCOUNTER — Ambulatory Visit (INDEPENDENT_AMBULATORY_CARE_PROVIDER_SITE_OTHER): Payer: Medicare Other | Admitting: Internal Medicine

## 2013-08-19 ENCOUNTER — Encounter: Payer: Self-pay | Admitting: Internal Medicine

## 2013-08-19 VITALS — BP 122/55 | HR 60 | Ht 70.0 in | Wt 258.8 lb

## 2013-08-19 DIAGNOSIS — I442 Atrioventricular block, complete: Secondary | ICD-10-CM

## 2013-08-19 DIAGNOSIS — I5031 Acute diastolic (congestive) heart failure: Secondary | ICD-10-CM

## 2013-08-19 DIAGNOSIS — I4892 Unspecified atrial flutter: Secondary | ICD-10-CM

## 2013-08-19 DIAGNOSIS — Z950 Presence of cardiac pacemaker: Secondary | ICD-10-CM

## 2013-08-19 LAB — MDC_IDC_ENUM_SESS_TYPE_INCLINIC
Battery Remaining Longevity: 82.8 mo
Battery Voltage: 2.93 V
Brady Statistic RA Percent Paced: 88 %
Brady Statistic RV Percent Paced: 0.86 %
Date Time Interrogation Session: 20150511091311
Implantable Pulse Generator Model: 2210
Implantable Pulse Generator Serial Number: 7393982
Lead Channel Impedance Value: 362.5 Ohm
Lead Channel Impedance Value: 512.5 Ohm
Lead Channel Pacing Threshold Amplitude: 0.75 V
Lead Channel Pacing Threshold Amplitude: 0.75 V
Lead Channel Pacing Threshold Pulse Width: 0.4 ms
Lead Channel Pacing Threshold Pulse Width: 0.4 ms
Lead Channel Sensing Intrinsic Amplitude: 2.5 mV
Lead Channel Sensing Intrinsic Amplitude: 4.2 mV
Lead Channel Setting Pacing Amplitude: 2 V
Lead Channel Setting Pacing Amplitude: 2.5 V
Lead Channel Setting Pacing Pulse Width: 0.4 ms
Lead Channel Setting Sensing Sensitivity: 2 mV

## 2013-08-19 NOTE — Progress Notes (Signed)
HPI  Ralph [name] returns today for followup. He has undergone catheter ablation of atrial flutter and appears to be maintaining NSR. He denies chest pain or palpitations. He has chronic class 2 sob which is multifactorial. He has had no syncope. Minimal peripheral edema. He admits to dietary indiscretion. Allergies  Allergen Reactions  . Nitroglycerin Hives, Swelling and Rash     Current Outpatient Prescriptions  Medication Sig Dispense Refill  . albuterol (PROVENTIL HFA;VENTOLIN HFA) 108 (90 BASE) MCG/ACT inhaler Inhale 2 puffs into the lungs every 6 (six) hours as needed for wheezing.      Marland Kitchen buPROPion (WELLBUTRIN SR) 150 MG 12 hr tablet Take 150 mg by mouth 2 (two) times daily.      . Canagliflozin (INVOKANA) 300 MG TABS Take 300 mg by mouth daily.       . diazepam (VALIUM) 10 MG tablet Take 10 mg by mouth every 6 (six) hours as needed for anxiety.      Marland Kitchen diltiazem (CARDIZEM CD) 360 MG 24 hr capsule Take 1 capsule (360 mg total) by mouth every morning.  90 capsule  6  . flecainide (TAMBOCOR) 100 MG tablet Take 1 tablet (100 mg total) by mouth 2 (two) times daily.  90 tablet  3  . furosemide (LASIX) 20 MG tablet Take 2 tablets (40 mg total) by mouth every morning.  180 tablet  3  . HYDROcodone-acetaminophen (NORCO) 10-325 MG per tablet Take 1 tablet by mouth every 6 (six) hours as needed for pain.      Marland Kitchen insulin glargine (LANTUS) 100 units/mL SOLN Inject 50 Units into the skin 2 (two) times daily.       Marland Kitchen levothyroxine (SYNTHROID, LEVOTHROID) 50 MCG tablet Take 50 mcg by mouth daily before breakfast.       . metFORMIN (GLUCOPHAGE) 1000 MG tablet Take 1,000 mg by mouth 2 (two) times daily with a meal.      . metoprolol tartrate (LOPRESSOR) 50 MG tablet Take 1 tablet (50 mg total) by mouth 2 (two) times daily.  60 tablet  11  . mometasone-formoterol (DULERA) 200-5 MCG/ACT AERO Inhale 2 puffs into the lungs 2 (two) times daily.      . Multiple Vitamin (MULTIVITAMIN) tablet Take 1 tablet  by mouth daily.      . ondansetron (ZOFRAN) 4 MG tablet Take 1 tablet (4 mg total) by mouth every 8 (eight) hours as needed for nausea or vomiting.  40 tablet  1  . pantoprazole (PROTONIX) 40 MG tablet Take 40 mg by mouth every morning.      . rivaroxaban (XARELTO) 20 MG TABS tablet Take 1 tablet (20 mg total) by mouth daily with supper.  30 tablet  0  . simvastatin (ZOCOR) 20 MG tablet Take 20 mg by mouth every evening.      Marland Kitchen spironolactone (ALDACTONE) 50 MG tablet Take 2 tablets (100 mg total) by mouth daily.  180 tablet  3  . Vitamin D, Ergocalciferol, (DRISDOL) 50000 UNITS CAPS capsule Take 50,000 Units by mouth every 7 (seven) days. Takes on Fridays.       No current facility-administered medications for this visit.     Past Medical History  Diagnosis Date  . Hypertension   . Cirrhosis     Hep A and B immune  . Depressed   . Varicose vein     of esophagus  . Difficult intubation     "trouble waking up afterwards" (01/06/2012)  . CHF (congestive  heart failure) 01/06/2012  . Sinus pause 01/06/2012    5.2 seconds  . Anginal pain   . Sleep apnea     "don't wear mask" (01/06/2012)  . Emphysema   . Type II diabetes mellitus   . GERD (gastroesophageal reflux disease)   . H/O hiatal hernia   . Coughing up blood     "comes from my throat" (01/06/2012)  . Arthritis     "back; fingers" (01/06/2012)  . Chronic lower back pain   . Fatty liver disease, nonalcoholic   . CHB (complete heart block)   . Presence of permanent cardiac pacemaker 9/292013    St.Jude  . Atrial flutter     s/p EPS +RF ablation of typical atrial flutter April 2015    ROS:   All systems reviewed and negative except as noted in the HPI.   Past Surgical History  Procedure Laterality Date  . Back surgery    . Spinal cord stimulator implant  2006  . Cholecystectomy  1993  . Nasal septum surgery  1992  . Tonsillectomy and adenoidectomy  1992  . Posterior fusion lumbar spine  1999    L4-5  . Lumbar disc  surgery  1994; ~ 1995; ~ 1996  . Warthin's tumor excision  1990's    right  . Permanent pacemaker insertion  01/08/2012    CHB  . US echocardiography  12/28/2011    mild LVH,mild mitral annulara ca+,mild MR  . Nuclear stress test  10/19/2004    No ischemia  . Esophagogastroduodenoscopy  11/08/2004    IOX:BDZHGD esophageal erosions consistent with erosive reflux esophagitis/Areas of hemorrhage and nodularity of the fundal mucosa of uncertain significance, biopsied.  Small hiatal hernia, otherwise normal stomach  . Colonoscopy  11/08/2004    JME:QASTMH rectum, colon, TI.  Marland Kitchen Esophagogastroduodenoscopy  2010    Dr. Gala Romney: 3 columns Grade 1 varices, erosive esophagitis, HH, portal gastropathy, normal D1, D2  . Esophagogastroduodenoscopy (egd) with esophageal dilation N/A 02/14/2013    DQQ:IWLNL 1 esophageal varices. Abnormal distal esophagus/status post biopsy after Maloney dilation. Portal gastropathy. Antral erosions-status post biopsy. path negative for H.pylori, benign path.     Family History  Problem Relation Age of Onset  . Stroke Brother   . Cancer Mother   . Arrhythmia Father      History   Social History  . Marital Status: Married    Spouse Name: N/A    Number of Children: N/A  . Years of Education: N/A   Occupational History  . Not on file.   Social History Main Topics  . Smoking status: Former Smoker -- 45 years    Types: Cigarettes  . Smokeless tobacco: Former Systems developer    Quit date: 11/13/2012     Comment: using Welbutrin and e-cigarettes-stopped smoking 12-03-12  . Alcohol Use: No     Comment: "quit alcohol 2011"  . Drug Use: No  . Sexual Activity: Not Currently   Other Topics Concern  . Not on file   Social History Narrative  . No narrative on file     BP 122/55  Pulse 60  Ht 5\' 10"  (1.778 m)  Wt 258 lb 12.8 oz (117.391 kg)  BMI 37.13 kg/m2  Physical Exam:  obese appearing middle aged man, NAD HEENT: Unremarkable Neck:  No JVD, no  thyromegally Back:  No CVA tenderness Lungs:  Clear with no wheezes HEART:  Regular rate rhythm, no murmurs, no rubs, no clicks Abd:  soft, positive bowel sounds, no organomegally,  no rebound, no guarding Ext:  2 plus pulses, no edema, no cyanosis, no clubbing Skin:  No rashes no nodules Neuro:  CN II through XII intact, motor grossly intact   DEVICE  Normal device function.  See PaceArt for details.   Assess/Plan:

## 2013-08-19 NOTE — Assessment & Plan Note (Signed)
His symptoms are class 2. He will continue his current meds and maintain a low sodium diet. I have asked him to lose weight.

## 2013-08-19 NOTE — Assessment & Plan Note (Signed)
His [employer] Jude DDD PM is working normally. Will recheck in several months. 

## 2013-08-19 NOTE — Patient Instructions (Signed)
Your physician recommends that you schedule a follow-up appointment in: 1 year with Dr Knox Saliva will receive a reminder letter two months in advance reminding you to call and schedule your appointment. If you don't receive this letter, please contact our office.  Remote monitoring is used to monitor your Pacemaker of ICD from home. This monitoring reduces the number of office visits required to check your device to one time per year. It allows [place] to keep an eye on the functioning of your device to ensure it is working properly. You are scheduled for a device check from home on 11-20-13. You may send your transmission at any time that day. If you have a wireless device, the transmission will be sent automatically. After your physician reviews your transmission, you will receive a postcard with your next transmission date.

## 2013-08-19 NOTE — Assessment & Plan Note (Signed)
Since his ablation, he has done well and ppm interogation confirms no atrial arrhythmias. He will continue his current meds.

## 2013-08-25 NOTE — Progress Notes (Signed)
Patient ID: JAHARI BILLY, male   DOB: 04-10-1951, 63 y.o.   MRN: [id number]     Clinical Summary Mr. Hasten is a 63 y.o.male seen today for follow up of the following problems.   1. History of sinus arrest  - St Jude dual chamber pacemaker pacemaker implanted Sept 2013 ([place] [facility] [employer]).  - device check 08/2013 with normal function, no arrhythmias noted - denies any significant symptoms  2. Afib/aflutter - previous side effects on multaq - seen by EP 06/28/13, started on flecanide. Continued to have symptoms. Had RF ablation of flutter by Dr Lovena Le 07/10/13. Follow up with PPM check showed no atrial arrhythmias. - denies any palpitations since procdure - continues on xarelto, has also been continued on flecanide  4. HTN  - compliant w/ meds  - checks bp at home once daily, typically 110s/60s  - does note some occasional lightheadness at times, typically while standing   5. HL  - compliant with simva  - no recent panel in our system, reports upcoming panel with endocrinologist   6. NASH cirrhosis  - followed by GI  - EGD 03/2013 with grade I varices, no history of bleeding on anticoag  - notes on occasion can cough up small traces of blood.   7. COPD  - compliant with inhalers and home oxygen. - reports he does not have a regular pulmonologist   Past Medical History  Diagnosis Date  . Hypertension   . Cirrhosis     Hep A and B immune  . Depressed   . Varicose vein     of esophagus  . Difficult intubation     "trouble waking up afterwards" (01/06/2012)  . CHF (congestive heart failure) 01/06/2012  . Sinus pause 01/06/2012    5.2 seconds  . Anginal pain   . Sleep apnea     "don't wear mask" (01/06/2012)  . Emphysema   . Type II diabetes mellitus   . GERD (gastroesophageal reflux disease)   . H/O hiatal hernia   . Coughing up blood     "comes from my throat" (01/06/2012)  . Arthritis     "back; fingers" (01/06/2012)  . Chronic lower back pain   . Fatty  liver disease, nonalcoholic   . CHB (complete heart block)   . Presence of permanent cardiac pacemaker 9/292013    [employer].Jude  . Atrial flutter     s/p EPS +RF ablation of typical atrial flutter April 2015     Allergies  Allergen Reactions  . Nitroglycerin Hives, Swelling and Rash     Current Outpatient Prescriptions  Medication Sig Dispense Refill  . albuterol (PROVENTIL HFA;VENTOLIN HFA) 108 (90 BASE) MCG/ACT inhaler Inhale 2 puffs into the lungs every 6 (six) hours as needed for wheezing.      Marland Kitchen buPROPion (WELLBUTRIN SR) 150 MG 12 hr tablet Take 150 mg by mouth 2 (two) times daily.      . Canagliflozin (INVOKANA) 300 MG TABS Take 300 mg by mouth daily.       . diazepam (VALIUM) 10 MG tablet Take 10 mg by mouth every 6 (six) hours as needed for anxiety.      Marland Kitchen diltiazem (CARDIZEM CD) 360 MG 24 hr capsule Take 1 capsule (360 mg total) by mouth every morning.  90 capsule  6  . flecainide (TAMBOCOR) 100 MG tablet Take 1 tablet (100 mg total) by mouth 2 (two) times daily.  90 tablet  3  . furosemide (LASIX)  20 MG tablet Take 2 tablets (40 mg total) by mouth every morning.  180 tablet  3  . HYDROcodone-acetaminophen (NORCO) 10-325 MG per tablet Take 1 tablet by mouth every 6 (six) hours as needed for pain.      Marland Kitchen insulin glargine (LANTUS) 100 units/mL SOLN Inject 50 Units into the skin 2 (two) times daily.       Marland Kitchen levothyroxine (SYNTHROID, LEVOTHROID) 50 MCG tablet Take 50 mcg by mouth daily before breakfast.       . metFORMIN (GLUCOPHAGE) 1000 MG tablet Take 1,000 mg by mouth 2 (two) times daily with a meal.      . metoprolol tartrate (LOPRESSOR) 50 MG tablet Take 1 tablet (50 mg total) by mouth 2 (two) times daily.  60 tablet  11  . mometasone-formoterol (DULERA) 200-5 MCG/ACT AERO Inhale 2 puffs into the lungs 2 (two) times daily.      . Multiple Vitamin (MULTIVITAMIN) tablet Take 1 tablet by mouth daily.      . ondansetron (ZOFRAN) 4 MG tablet Take 1 tablet (4 mg total) by mouth every 8  (eight) hours as needed for nausea or vomiting.  40 tablet  1  . pantoprazole (PROTONIX) 40 MG tablet Take 40 mg by mouth every morning.      . rivaroxaban (XARELTO) 20 MG TABS tablet Take 1 tablet (20 mg total) by mouth daily with supper.  30 tablet  0  . simvastatin (ZOCOR) 20 MG tablet Take 20 mg by mouth every evening.      Marland Kitchen spironolactone (ALDACTONE) 50 MG tablet Take 2 tablets (100 mg total) by mouth daily.  180 tablet  3  . Vitamin D, Ergocalciferol, (DRISDOL) 50000 UNITS CAPS capsule Take 50,000 Units by mouth every 7 (seven) days. Takes on Fridays.       No current facility-administered medications for this visit.     Past Surgical History  Procedure Laterality Date  . Back surgery    . Spinal cord stimulator implant  2006  . Cholecystectomy  1993  . Nasal septum surgery  1992  . Tonsillectomy and adenoidectomy  1992  . Posterior fusion lumbar spine  1999    L4-5  . Lumbar disc surgery  1994; ~ 1995; ~ 1996  . Warthin's tumor excision  1990's    right  . Permanent pacemaker insertion  01/08/2012    CHB  . US echocardiography  12/28/2011    mild LVH,mild mitral annulara ca+,mild MR  . Nuclear stress test  10/19/2004    No ischemia  . Esophagogastroduodenoscopy  11/08/2004    AGT:XMIWOE esophageal erosions consistent with erosive reflux esophagitis/Areas of hemorrhage and nodularity of the fundal mucosa of uncertain significance, biopsied.  Small hiatal hernia, otherwise normal stomach  . Colonoscopy  11/08/2004    HOZ:YYQMGN rectum, colon, TI.  Marland Kitchen Esophagogastroduodenoscopy  2010    Dr. Gala Romney: 3 columns Grade 1 varices, erosive esophagitis, HH, portal gastropathy, normal D1, D2  . Esophagogastroduodenoscopy (egd) with esophageal dilation N/A 02/14/2013    OIB:BCWUG 1 esophageal varices. Abnormal distal esophagus/status post biopsy after Maloney dilation. Portal gastropathy. Antral erosions-status post biopsy. path negative for H.pylori, benign path.     Allergies    Allergen Reactions  . Nitroglycerin Hives, Swelling and Rash      Family History  Problem Relation Age of Onset  . Stroke Brother   . Cancer Mother   . Arrhythmia Father      Social History Mr. Rosenwald reports that he has quit smoking.  His smoking use included Cigarettes. He smoked 0.00 packs per day for 45 years. He quit smokeless tobacco use about 9 months ago. Mr. Hutchinson reports that he does not drink alcohol.   Review of Systems CONSTITUTIONAL: No weight loss, fever, chills, weakness or fatigue.  HEENT: Eyes: No visual loss, blurred vision, double vision or yellow sclerae.No hearing loss, sneezing, congestion, runny nose or sore throat.  SKIN: No rash or itching.  CARDIOVASCULAR: per HPI RESPIRATORY: per HPI  GASTROINTESTINAL: No anorexia, nausea, vomiting or diarrhea. No abdominal pain or blood.  GENITOURINARY: No burning on urination, no polyuria NEUROLOGICAL: No headache, dizziness, syncope, paralysis, ataxia, numbness or tingling in the extremities. No change in bowel or bladder control.  MUSCULOSKELETAL: No muscle, back pain, joint pain or stiffness.  LYMPHATICS: No enlarged nodes. No history of splenectomy.  PSYCHIATRIC: No history of depression or anxiety.  ENDOCRINOLOGIC: No reports of sweating, cold or heat intolerance. No polyuria or polydipsia.  Marland Kitchen   Physical Examination p 60 bp 108/56 Wt 257 lbs BMI 36 Gen: resting comfortably, no acute distress HEENT: no scleral icterus, pupils equal round and reactive, no palptable cervical adenopathy,  CV: RRR, no m/r/g, no JVD Resp: Clear to auscultation bilaterally GI: abdomen is soft, non-tender, non-distended, normal bowel sounds, no hepatosplenomegaly MSK: extremities are warm, no edema.  Skin: warm, no rash Neuro:  no focal deficits Psych: appropriate affect   Diagnostic Studies Jan 2014 Myoview: no ischemia   11/2012 Echo: LVEF 60-65%, mild LVH, moderate basal septal hypertrophy, mild LAE     Assessment  and Plan  1. Afib/Aflutter - no current symptoms since recent aflutter ablation, pacemaker check with no atrial arrhythmias - defer continued anticoag and flecanide to Dr Lovena Le  2. HTN:  - at goal,continue current meds  - describes positional dizziness at times, will stop his metoprolol. Likely does need as aggressive regimen now that maintaining NSR after RF ablation and on flecanide  3. HL  - followed by PCP. Reports PCP is watching his LFTs closely due to an increase and history of NASH, will defer management to PCP   4. Sinus arrest  - normal pacemaker check on last visit, continue regular checks  5. COPD - reports he does not have a regular pulmonologist, will refer to [facility]. He has requested to see Dr Lamonte Sakai, who also takes care of his wife.     F/u 6 months     Arnoldo Lenis, [name], F.A.C.C.

## 2013-08-26 ENCOUNTER — Ambulatory Visit (INDEPENDENT_AMBULATORY_CARE_PROVIDER_SITE_OTHER): Payer: Medicare Other | Admitting: Cardiology

## 2013-08-26 VITALS — BP 108/56 | HR 60 | Ht 70.0 in | Wt 257.0 lb

## 2013-08-26 DIAGNOSIS — I10 Essential (primary) hypertension: Secondary | ICD-10-CM

## 2013-08-26 DIAGNOSIS — I4892 Unspecified atrial flutter: Secondary | ICD-10-CM

## 2013-08-26 DIAGNOSIS — I442 Atrioventricular block, complete: Secondary | ICD-10-CM

## 2013-08-26 DIAGNOSIS — J449 Chronic obstructive pulmonary disease, unspecified: Secondary | ICD-10-CM

## 2013-08-26 NOTE — Patient Instructions (Signed)
Your physician wants you to follow-up in: 6 months You will receive a reminder letter in the mail two months in advance. If you don't receive a letter, please call our office to schedule the follow-up appointment.  Your physician has recommended you make the following change in your medication:    STOP metoprolol  We are referring you to Dr.Byrom    Thank you for choosing [facility] !

## 2013-08-27 ENCOUNTER — Encounter: Payer: Self-pay | Admitting: Cardiology

## 2013-08-28 ENCOUNTER — Telehealth: Payer: Self-pay | Admitting: Cardiology

## 2013-08-28 NOTE — Telephone Encounter (Signed)
Metoprolol d/c'd at last visit because of low bp per pt (70 / sys HR 60), pt now has palpitations, HR up to 105 bmp and BP up to 150/sys   Please advise

## 2013-08-28 NOTE — Telephone Encounter (Signed)
Please have patient start back metoprolol 25mg  bid and follow symptoms, if continue then may increase to prior dose of 50mg  bid.  Carlyle Dolly MD

## 2013-08-28 NOTE — Telephone Encounter (Signed)
Patient states that since his meds were changed at visit he has been having issues with his heart rate / tgs

## 2013-08-29 ENCOUNTER — Encounter: Payer: Self-pay | Admitting: Cardiology

## 2013-08-29 NOTE — Telephone Encounter (Signed)
Pt is made aware and will follow symptoms.

## 2013-09-05 ENCOUNTER — Encounter: Payer: Self-pay | Admitting: Emergency Medicine

## 2013-09-05 ENCOUNTER — Ambulatory Visit (INDEPENDENT_AMBULATORY_CARE_PROVIDER_SITE_OTHER): Payer: Medicare Other | Admitting: Emergency Medicine

## 2013-09-05 ENCOUNTER — Encounter: Payer: Self-pay | Admitting: *Deleted

## 2013-09-05 VITALS — BP 122/74 | HR 63 | Ht 70.0 in | Wt 256.8 lb

## 2013-09-05 DIAGNOSIS — G4730 Sleep apnea, unspecified: Secondary | ICD-10-CM

## 2013-09-05 DIAGNOSIS — J449 Chronic obstructive pulmonary disease, unspecified: Secondary | ICD-10-CM

## 2013-09-05 DIAGNOSIS — R0902 Hypoxemia: Secondary | ICD-10-CM

## 2013-09-05 NOTE — Progress Notes (Signed)
Subjective:    Patient ID: Ralph [name], male    DOB: 08-10-1950, 63 y.o.   MRN: [id number]  HPI 63 yo man, former smoker (130pk-yrs), HTN, cirrhosis w varices, OSA not on CPAP, DM, A flutter s/p ablation and pacer, hiatal hernia. He has dysphagia. Has been dx with COPD in 2014 by Dr Luan Pulling. Wears O2 at 2L/min since Summer 2014. He is on Dulera + ProAir prn. He isn't sure they help him. He was once on Spiriva, didn't feels it helped. He has dyspnea that can happen at rest or with exertion. He has nasal congestion and allergies.    PFT 04/2011 > suggests mixed disease, moderate AFL, no BD response, normal volumes, decreased DLCO.    Review of Systems  Constitutional: Negative for fever and unexpected weight change.  HENT: Positive for congestion, postnasal drip, sinus pressure and trouble swallowing. Negative for dental problem, ear pain, nosebleeds, rhinorrhea, sneezing and sore throat.   Eyes: Negative for redness and itching.  Respiratory: Positive for shortness of breath. Negative for cough, chest tightness and wheezing.   Cardiovascular: Positive for chest pain, palpitations and leg swelling.       Hand and feet  Gastrointestinal: Negative for nausea and vomiting.  Genitourinary: Negative for dysuria.  Musculoskeletal: Negative for joint swelling.  Skin: Negative for rash.  Neurological: Negative for headaches.  Hematological: Does not bruise/bleed easily.  Psychiatric/Behavioral: Positive for dysphoric mood. The patient is nervous/anxious.    Past Medical History  Diagnosis Date  . Hypertension   . Cirrhosis     Hep A and B immune  . Depressed   . Varicose vein     of esophagus  . Difficult intubation     "trouble waking up afterwards" (01/06/2012)  . CHF (congestive heart failure) 01/06/2012  . Sinus pause 01/06/2012    5.2 seconds  . Anginal pain   . Sleep apnea     "don't wear mask" (01/06/2012)  . Emphysema   . Type II diabetes mellitus   . GERD (gastroesophageal  reflux disease)   . H/O hiatal hernia   . Coughing up blood     "comes from my throat" (01/06/2012)  . Arthritis     "back; fingers" (01/06/2012)  . Chronic lower back pain   . Fatty liver disease, nonalcoholic   . CHB (complete heart block)   . Presence of permanent cardiac pacemaker 9/292013    [facility]  . Atrial flutter     s/p EPS +RF ablation of typical atrial flutter April 2015     Family History  Problem Relation Age of Onset  . Stroke Brother   . Cancer Mother   . Arrhythmia Father      History   Social History  . Marital Status: Married    Spouse Name: N/A    Number of Children: N/A  . Years of Education: N/A   Occupational History  . Not on file.   Social History Main Topics  . Smoking status: Former Smoker -- 3.00 packs/day for 45 years    Types: Cigarettes    Quit date: 03/11/2013  . Smokeless tobacco: Former Systems developer    Quit date: 11/13/2012     Comment: using Welbutrin and e-cigarettes-stopped smoking 12-03-12  . Alcohol Use: No     Comment: "quit alcohol 2011"  . Drug Use: No  . Sexual Activity: Not Currently   Other Topics Concern  . Not on file   Social History Narrative  . No narrative on  file     Allergies  Allergen Reactions  . Nitroglycerin Hives, Swelling and Rash     Outpatient Prescriptions Prior to Visit  Medication Sig Dispense Refill  . albuterol (PROVENTIL HFA;VENTOLIN HFA) 108 (90 BASE) MCG/ACT inhaler Inhale 2 puffs into the lungs every 6 (six) hours as needed for wheezing.      Marland Kitchen buPROPion (WELLBUTRIN SR) 150 MG 12 hr tablet Take 150 mg by mouth 2 (two) times daily.      . Canagliflozin (INVOKANA) 300 MG TABS Take 300 mg by mouth daily.       . diazepam (VALIUM) 10 MG tablet Take 10 mg by mouth every 6 (six) hours as needed for anxiety.      Marland Kitchen diltiazem (CARDIZEM CD) 360 MG 24 hr capsule Take 1 capsule (360 mg total) by mouth every morning.  90 capsule  6  . flecainide (TAMBOCOR) 100 MG tablet Take 1 tablet (100 mg total) by mouth  2 (two) times daily.  90 tablet  3  . furosemide (LASIX) 20 MG tablet Take 2 tablets (40 mg total) by mouth every morning.  180 tablet  3  . HYDROcodone-acetaminophen (NORCO) 10-325 MG per tablet Take 1 tablet by mouth every 6 (six) hours as needed for pain.      Marland Kitchen insulin glargine (LANTUS) 100 units/mL SOLN Inject 50 Units into the skin 2 (two) times daily.       Marland Kitchen levothyroxine (SYNTHROID, LEVOTHROID) 50 MCG tablet Take 50 mcg by mouth daily before breakfast.       . metFORMIN (GLUCOPHAGE) 1000 MG tablet Take 1,000 mg by mouth 2 (two) times daily with a meal.      . metoprolol tartrate (LOPRESSOR) 25 MG tablet Take 25 mg by mouth 2 (two) times daily.      . mometasone-formoterol (DULERA) 200-5 MCG/ACT AERO Inhale 2 puffs into the lungs 2 (two) times daily.      . Multiple Vitamin (MULTIVITAMIN) tablet Take 1 tablet by mouth daily.      . ondansetron (ZOFRAN) 4 MG tablet Take 1 tablet (4 mg total) by mouth every 8 (eight) hours as needed for nausea or vomiting.  40 tablet  1  . pantoprazole (PROTONIX) 40 MG tablet Take 40 mg by mouth every morning.      . rivaroxaban (XARELTO) 20 MG TABS tablet Take 1 tablet (20 mg total) by mouth daily with supper.  30 tablet  0  . simvastatin (ZOCOR) 20 MG tablet Take 20 mg by mouth every evening.      . Vitamin D, Ergocalciferol, (DRISDOL) 50000 UNITS CAPS capsule Take 50,000 Units by mouth every 7 (seven) days. Takes on Fridays.      Marland Kitchen spironolactone (ALDACTONE) 50 MG tablet Take 2 tablets (100 mg total) by mouth daily.  180 tablet  3   No facility-administered medications prior to visit.         Objective:   Physical Exam Filed Vitals:   09/05/13 0936  BP: 122/74  Pulse: 63  Height: 5\' 10"  (1.778 m)  Weight: 256 lb 12.8 oz (116.484 kg)  SpO2: 97%   Gen: Pleasant, obese, in no distress,  normal affect on O2  ENT: No lesions,  mouth clear,  oropharynx clear, no postnasal drip  Neck: No JVD, no TMG, no carotid bruits  Lungs: No use of  accessory muscles, clear without rales or rhonchi  Cardiovascular: RRR, heart sounds normal, no murmur or gallops, trace peripheral edema  Musculoskeletal: No deformities, no cyanosis  or clubbing  Neuro: alert, non focal  Skin: Warm, no lesions or rashes    11/21/12 --  Study Conclusions - Left ventricle: The cavity size was normal. Wall thickness was increased in a pattern of mild LVH. There was moderate asymmetric hypertrophy of the septum. Systolic function was normal. The estimated ejection fraction was in the range of 60% to 65%. Wall motion was normal; there were no regional wall motion abnormalities. The study is not technically sufficient to allow evaluation of LV diastolic function. - Mitral valve: Trivial regurgitation. - Left atrium: The atrium was mildly dilated. - Right ventricle: The cavity size was mildly to moderately dilated. Pacer wire or catheter noted in right ventricle. Systolic function was normal. - Right atrium: Central venous pressure: 60mm Hg (est). - Tricuspid valve: Trivial regurgitation. - Pulmonary arteries: PA peak pressure: 69mm Hg (S). - Pericardium, extracardiac: There was no pericardial effusion. Impressions:  - No prior study available for comparison. Mild LVH with moderate septal hypertrophy, LVEF 60-65%. indeterminate diastolic function. Mild left atrial enlargement. Mild to moderate RV enlargement, device wire noted. Normal PASP and CVP. No pericardial effusion.     Assessment & Plan:  COPD (chronic obstructive pulmonary disease) - will repeat his walking oximetry today - trial changing dulera to anoro  - SABA prn/.  - consider repeat PFT's to compare with priors.  - a1-AT testing  Hypoxemia His hypoxemia seems to be out of proportion to his COPD. Consider other causes like hypoventilation, shunting (? AVM's).  - consider bubble study depending on repeat walking oximetry, other workup - consider CT-PA to look for vascular or  parenchymal disease.   Sleep apnea, pt declines C-Ppap Not currently on CPAP . He is skeptical that his PSG was a good study - will likely need to repeat a split night study vs perform a new titration and mask fitting. Will address this with him next time.

## 2013-09-05 NOTE — Assessment & Plan Note (Signed)
Not currently on CPAP . He is skeptical that his PSG was a good study - will likely need to repeat a split night study vs perform a new titration and mask fitting. Will address this with him next time.

## 2013-09-05 NOTE — Assessment & Plan Note (Addendum)
His hypoxemia seems to be out of proportion to his COPD. Consider other causes like hypoventilation, shunting (? AVM's).  - consider bubble study depending on repeat walking oximetry, other workup - consider CT-PA to look for vascular or parenchymal disease.

## 2013-09-05 NOTE — Patient Instructions (Signed)
Start Anoro 1 puff daily until next visit with Dr. Baltazar Apo Return x1 month With Dr. Baltazar Apo

## 2013-09-05 NOTE — Assessment & Plan Note (Addendum)
-   will repeat his walking oximetry today - trial changing dulera to anoro  - SABA prn/.  - consider repeat PFT's to compare with priors.  - a1-AT testing

## 2013-09-27 ENCOUNTER — Telehealth: Payer: Self-pay | Admitting: Emergency Medicine

## 2013-09-27 MED ORDER — UMECLIDINIUM-VILANTEROL 62.5-25 MCG/INH IN AEPB: INHALATION_SPRAY | RESPIRATORY_TRACT | Status: DC

## 2013-09-27 NOTE — Telephone Encounter (Signed)
Pt aware samples left for pick up. Nothing further needed 

## 2013-10-10 ENCOUNTER — Telehealth: Payer: Self-pay

## 2013-10-10 NOTE — Telephone Encounter (Signed)
Message copied by Channing Mutters on Thu Oct 10, 2013  1:55 PM ------      Message from: Annabell Sabal T      Created: Wed Sep 04, 2013  3:51 PM      Regarding: Clinical staff needs help closing encounter.       Manfred Arch,      This patient has showed up on the Open Encounters report twice.  I've sent it to the [facility] and [facility] and they tell me it will not allow them to close it.  Can you take a look at it and let me know what they need to do to close it?      Thanks,      Lexine Baton ------

## 2013-10-15 ENCOUNTER — Other Ambulatory Visit: Payer: Medicare Other

## 2013-10-15 ENCOUNTER — Ambulatory Visit (INDEPENDENT_AMBULATORY_CARE_PROVIDER_SITE_OTHER): Payer: Medicare Other | Admitting: Emergency Medicine

## 2013-10-15 ENCOUNTER — Encounter: Payer: Self-pay | Admitting: Emergency Medicine

## 2013-10-15 VITALS — BP 128/84 | HR 64 | Ht 70.0 in | Wt 255.0 lb

## 2013-10-15 DIAGNOSIS — L408 Other psoriasis: Secondary | ICD-10-CM

## 2013-10-15 DIAGNOSIS — G4730 Sleep apnea, unspecified: Secondary | ICD-10-CM

## 2013-10-15 DIAGNOSIS — L409 Psoriasis, unspecified: Secondary | ICD-10-CM | POA: Insufficient documentation

## 2013-10-15 DIAGNOSIS — J449 Chronic obstructive pulmonary disease, unspecified: Secondary | ICD-10-CM

## 2013-10-15 DIAGNOSIS — J4489 Other specified chronic obstructive pulmonary disease: Secondary | ICD-10-CM

## 2013-10-15 DIAGNOSIS — R0902 Hypoxemia: Secondary | ICD-10-CM

## 2013-10-15 MED ORDER — UMECLIDINIUM-VILANTEROL 62.5-25 MCG/INH IN AEPB: INHALATION_SPRAY | RESPIRATORY_TRACT | Status: DC

## 2013-10-15 NOTE — Progress Notes (Signed)
Subjective:    Patient ID: Ralph [name], male    DOB: 07/02/1950, 63 y.o.   MRN: [id number]  HPI 63 yo man, former smoker (130pk-yrs), HTN, cirrhosis w varices, OSA not on CPAP, DM, A flutter s/p ablation and pacer, hiatal hernia. He has dysphagia. Has been dx with COPD in 2014 by Dr Luan Pulling. Wears O2 at 2L/min since Summer 2014. He is on Dulera + ProAir prn. He isn't sure they help him. He was once on Spiriva, didn't feels it helped. He has dyspnea that can happen at rest or with exertion. He has nasal congestion and allergies.    PFT 04/2011 > suggests mixed disease, moderate AFL, no BD response, normal volumes, decreased DLCO.   ROV 10/15/13 -- hx COPD, OSA, restrictive dz. Last time we tried changing dulera to anoro, there was not a big difference with the new medication. He uses albuterol 2-3 times a day. He is still having apneic episodes. Waking up in resp distress.  He uses O2 with exertion, wants a portable concentrator. He has a new psoriatic lesion on L knee.    Review of Systems  Constitutional: Negative for fever and unexpected weight change.  HENT: Positive for congestion, postnasal drip, sinus pressure and trouble swallowing. Negative for dental problem, ear pain, nosebleeds, rhinorrhea, sneezing and sore throat.   Eyes: Negative for redness and itching.  Respiratory: Positive for shortness of breath. Negative for cough, chest tightness and wheezing.   Cardiovascular: Positive for chest pain, palpitations and leg swelling.       Hand and feet  Gastrointestinal: Negative for nausea and vomiting.  Genitourinary: Negative for dysuria.  Musculoskeletal: Negative for joint swelling.  Skin: Negative for rash.  Neurological: Negative for headaches.  Hematological: Does not bruise/bleed easily.  Psychiatric/Behavioral: Positive for dysphoric mood. The patient is nervous/anxious.    Past Medical History  Diagnosis Date  . Hypertension   . Cirrhosis     Hep A and B immune  .  Depressed   . Varicose vein     of esophagus  . Difficult intubation     "trouble waking up afterwards" (01/06/2012)  . CHF (congestive heart failure) 01/06/2012  . Sinus pause 01/06/2012    5.2 seconds  . Anginal pain   . Sleep apnea     "don't wear mask" (01/06/2012)  . Emphysema   . Type II diabetes mellitus   . GERD (gastroesophageal reflux disease)   . H/O hiatal hernia   . Coughing up blood     "comes from my throat" (01/06/2012)  . Arthritis     "back; fingers" (01/06/2012)  . Chronic lower back pain   . Fatty liver disease, nonalcoholic   . CHB (complete heart block)   . Presence of permanent cardiac pacemaker 9/292013    [facility]  . Atrial flutter     s/p EPS +RF ablation of typical atrial flutter April 2015     Family History  Problem Relation Age of Onset  . Stroke Brother   . Cancer Mother   . Arrhythmia Father      History   Social History  . Marital Status: Married    Spouse Name: N/A    Number of Children: N/A  . Years of Education: N/A   Occupational History  . Not on file.   Social History Main Topics  . Smoking status: Former Smoker -- 3.00 packs/day for 45 years    Types: Cigarettes    Quit date: 03/11/2013  .  Smokeless tobacco: Former Systems developer    Quit date: 11/13/2012     Comment: using Welbutrin and e-cigarettes-stopped smoking 12-03-12  . Alcohol Use: No     Comment: "quit alcohol 2011"  . Drug Use: No  . Sexual Activity: Not Currently   Other Topics Concern  . Not on file   Social History Narrative  . No narrative on file     Allergies  Allergen Reactions  . Nitroglycerin Hives, Swelling and Rash     Outpatient Prescriptions Prior to Visit  Medication Sig Dispense Refill  . albuterol (PROVENTIL HFA;VENTOLIN HFA) 108 (90 BASE) MCG/ACT inhaler Inhale 2 puffs into the lungs every 6 (six) hours as needed for wheezing.      Marland Kitchen buPROPion (WELLBUTRIN SR) 150 MG 12 hr tablet Take 150 mg by mouth 2 (two) times daily.      . Canagliflozin  (INVOKANA) 300 MG TABS Take 300 mg by mouth daily.       . diazepam (VALIUM) 10 MG tablet Take 10 mg by mouth every 6 (six) hours as needed for anxiety.      Marland Kitchen diltiazem (CARDIZEM CD) 360 MG 24 hr capsule Take 1 capsule (360 mg total) by mouth every morning.  90 capsule  6  . flecainide (TAMBOCOR) 100 MG tablet Take 1 tablet (100 mg total) by mouth 2 (two) times daily.  90 tablet  3  . furosemide (LASIX) 20 MG tablet Take 2 tablets (40 mg total) by mouth every morning.  180 tablet  3  . HYDROcodone-acetaminophen (NORCO) 10-325 MG per tablet Take 1 tablet by mouth every 6 (six) hours as needed for pain.      Marland Kitchen insulin glargine (LANTUS) 100 units/mL SOLN Inject 50 Units into the skin 2 (two) times daily.       Marland Kitchen levothyroxine (SYNTHROID, LEVOTHROID) 50 MCG tablet Take 50 mcg by mouth daily before breakfast.       . metFORMIN (GLUCOPHAGE) 1000 MG tablet Take 1,000 mg by mouth 2 (two) times daily with a meal.      . metoprolol tartrate (LOPRESSOR) 25 MG tablet Take 25 mg by mouth 2 (two) times daily.      . Multiple Vitamin (MULTIVITAMIN) tablet Take 1 tablet by mouth daily.      . ondansetron (ZOFRAN) 4 MG tablet Take 1 tablet (4 mg total) by mouth every 8 (eight) hours as needed for nausea or vomiting.  40 tablet  1  . pantoprazole (PROTONIX) 40 MG tablet Take 40 mg by mouth every morning.      . rivaroxaban (XARELTO) 20 MG TABS tablet Take 1 tablet (20 mg total) by mouth daily with supper.  30 tablet  0  . simvastatin (ZOCOR) 20 MG tablet Take 20 mg by mouth every evening.      . Vitamin D, Ergocalciferol, (DRISDOL) 50000 UNITS CAPS capsule Take 50,000 Units by mouth every 7 (seven) days. Takes on Fridays.      Marland Kitchen Umeclidinium-Vilanterol (ANORO ELLIPTA) 62.5-25 MCG/INH AEPB 1 puff once daily  3 each  0  . mometasone-formoterol (DULERA) 200-5 MCG/ACT AERO Inhale 2 puffs into the lungs 2 (two) times daily.       No facility-administered medications prior to visit.         Objective:   Physical  Exam Filed Vitals:   10/15/13 1133  BP: 128/84  Pulse: 64  Height: 5\' 10"  (1.778 m)  Weight: 255 lb (115.667 kg)  SpO2: 94%   Gen: Pleasant, obese, in no  distress,  normal affect on O2  ENT: No lesions,  mouth clear,  oropharynx clear, no postnasal drip  Neck: No JVD, no TMG, no carotid bruits  Lungs: No use of accessory muscles, clear without rales or rhonchi  Cardiovascular: RRR, heart sounds normal, no murmur or gallops, trace peripheral edema  Musculoskeletal: No deformities, no cyanosis or clubbing  Neuro: alert, non focal  Skin: Warm, no lesions or rashes    11/21/12 --  Study Conclusions - Left ventricle: The cavity size was normal. Wall thickness was increased in a pattern of mild LVH. There was moderate asymmetric hypertrophy of the septum. Systolic function was normal. The estimated ejection fraction was in the range of 60% to 65%. Wall motion was normal; there were no regional wall motion abnormalities. The study is not technically sufficient to allow evaluation of LV diastolic function. - Mitral valve: Trivial regurgitation. - Left atrium: The atrium was mildly dilated. - Right ventricle: The cavity size was mildly to moderately dilated. Pacer wire or catheter noted in right ventricle. Systolic function was normal. - Right atrium: Central venous pressure: 64mm Hg (est). - Tricuspid valve: Trivial regurgitation. - Pulmonary arteries: PA peak pressure: 6mm Hg (S). - Pericardium, extracardiac: There was no pericardial effusion. Impressions:  - No prior study available for comparison. Mild LVH with moderate septal hypertrophy, LVEF 60-65%. indeterminate diastolic function. Mild left atrial enlargement. Mild to moderate RV enlargement, device wire noted. Normal PASP and CVP. No pericardial effusion.     Assessment & Plan:  Psoriasis Rash on knee looks psoriatic. Could impact his pulm status (increased risk for ILD, for PAH) - will refer for Derm  eval  Sleep apnea, pt declines C-Ppap Needs new split night PSG  COPD (chronic obstructive pulmonary disease) - continue the Anoro for now - O2 w exertion - order portable concentrator

## 2013-10-15 NOTE — Assessment & Plan Note (Signed)
-   continue the Anoro for now - O2 w exertion - order portable concentrator

## 2013-10-15 NOTE — Assessment & Plan Note (Signed)
Needs new split night PSG

## 2013-10-15 NOTE — Assessment & Plan Note (Signed)
Rash on knee looks psoriatic. Could impact his pulm status (increased risk for ILD, for [facility]) - will refer for Derm eval

## 2013-10-15 NOTE — Patient Instructions (Signed)
Please continue Anoro once a day Use albuterol 2 puffs as needed for Shortness of breath Wear your oxygen with all exertion  We will perform a split-night sleep study We will refer you to dermatology to evaluate for psoriasis Follow with Dr Lamonte Sakai in 2 months or sooner if you have any problems.

## 2013-10-23 LAB — ALPHA-1 ANTITRYPSIN PHENOTYPE: A1 ANTITRYPSIN: 171 mg/dL (ref 83–199)

## 2013-10-25 ENCOUNTER — Ambulatory Visit: Payer: Medicare Other | Admitting: Internal Medicine

## 2013-11-01 ENCOUNTER — Other Ambulatory Visit: Payer: Self-pay | Admitting: Internal Medicine

## 2013-11-01 ENCOUNTER — Ambulatory Visit (INDEPENDENT_AMBULATORY_CARE_PROVIDER_SITE_OTHER): Payer: Medicare Other | Admitting: Internal Medicine

## 2013-11-01 ENCOUNTER — Telehealth: Payer: Self-pay

## 2013-11-01 ENCOUNTER — Encounter: Payer: Self-pay | Admitting: Internal Medicine

## 2013-11-01 ENCOUNTER — Telehealth: Payer: Self-pay | Admitting: Cardiology

## 2013-11-01 VITALS — BP 134/76 | HR 85 | Temp 98.4°F | Ht 70.0 in | Wt 253.8 lb

## 2013-11-01 DIAGNOSIS — K7460 Unspecified cirrhosis of liver: Secondary | ICD-10-CM

## 2013-11-01 DIAGNOSIS — K219 Gastro-esophageal reflux disease without esophagitis: Secondary | ICD-10-CM

## 2013-11-01 NOTE — Telephone Encounter (Signed)
Received correspondence from GI regarding changing patient to non-selecting beta blocker in setting of liver disease and varices. Ok to stop lopressor, change to nadolol 40mg  daily. Please make patient aware and place Rx.    Zandra Abts MD

## 2013-11-01 NOTE — Patient Instructions (Signed)
Hepatic ultrasound  Continue current medical regimen  Will check with Dr. Harl Bowie regarding switching Metoprolol to Inderal or Naldolol  Colonoscopy in 1 year.  Regular exercise and weight loss  Office visit in 6 months

## 2013-11-01 NOTE — Telephone Encounter (Signed)
Sending a message to Dr. Nelly Laurence nurse, Vernie Murders:  This pt was seen in our office today by Dr. Gala Romney .   Dr. Gala Romney would like to know if Dr. Harl Bowie would recommend switching the pt from Metoprolol to Inderal or Nadolol.  Please advise!

## 2013-11-01 NOTE — Telephone Encounter (Signed)
Appreciate Dr. Reyne Dumas input  Let's stop the top metoprolol and begin Naldolol 40 mg daily-dispense 30 tablets with 11 refills. Office followup as planned.

## 2013-11-01 NOTE — Progress Notes (Signed)
Primary Care Physician:  Leonides Grills, MD Primary Gastroenterologist:  Dr. Gala Romney  Pre-Procedure History & Physical: HPI:  Ralph [name] is a 63 y.o. male here for followup of Nash/cirrhosis. GERD and probable gastroparesis. Remains significantly obese. Hemoglobin A1c is 7-8 range. Protonix. daily  -  controls reflux symptoms.  May have sleep apnea-getting a study in the near future. Due for average risk screening colonoscopy one year from now. Due for hepatoma screening now. Elevated alkaline phosphatase this will likely secondary to Singer. No nonselective beta blocker. RF ablation earlier this year. Continue on Xarelto.    Past Medical History  Diagnosis Date  . Hypertension   . Cirrhosis     NASH-Hep A and B immune  . Depressed   . Varicose vein     of esophagus  . Difficult intubation     "trouble waking up afterwards" (01/06/2012)  . CHF (congestive heart failure) 01/06/2012  . Sinus pause 01/06/2012    5.2 seconds  . Anginal pain   . Sleep apnea     "don't wear mask" (01/06/2012)  . Emphysema   . Type II diabetes mellitus   . GERD (gastroesophageal reflux disease)   . H/O hiatal hernia   . Coughing up blood     "comes from my throat" (01/06/2012)  . Arthritis     "back; fingers" (01/06/2012)  . Chronic lower back pain   . Fatty liver disease, nonalcoholic   . CHB (complete heart block)   . Presence of permanent cardiac pacemaker 9/292013    [facility]  . Atrial flutter     s/p EPS +RF ablation of typical atrial flutter April 2015    Past Surgical History  Procedure Laterality Date  . Back surgery    . Spinal cord stimulator implant  2006  . Cholecystectomy  1993  . Nasal septum surgery  1992  . Tonsillectomy and adenoidectomy  1992  . Posterior fusion lumbar spine  1999    L4-5  . Lumbar disc surgery  1994; ~ 1995; ~ 1996  . Warthin's tumor excision  1990's    right  . Permanent pacemaker insertion  01/08/2012    CHB  . US echocardiography  12/28/2011   mild LVH,mild mitral annulara ca+,mild MR  . Nuclear stress test  10/19/2004    No ischemia  . Esophagogastroduodenoscopy  11/08/2004    GDJ:MEQAST esophageal erosions consistent with erosive reflux esophagitis/Areas of hemorrhage and nodularity of the fundal mucosa of uncertain significance, biopsied.  Small hiatal hernia, otherwise normal stomach  . Colonoscopy  11/08/2004    MHD:QQIWLN rectum, colon, TI.  Marland Kitchen Esophagogastroduodenoscopy  2010    Dr. Gala Romney: 3 columns Grade 1 varices, erosive esophagitis, HH, portal gastropathy, normal D1, D2  . Esophagogastroduodenoscopy (egd) with esophageal dilation N/A 02/14/2013    LGX:QJJHE 1 esophageal varices. Abnormal distal esophagus/status post biopsy after Maloney dilation. Portal gastropathy. Antral erosions-status post biopsy. path negative for H.pylori, benign path.    Prior to Admission medications   Medication Sig Start Date End Date Taking? Authorizing Provider  albuterol (PROVENTIL HFA;VENTOLIN HFA) 108 (90 BASE) MCG/ACT inhaler Inhale 2 puffs into the lungs every 6 (six) hours as needed for wheezing.   Yes Historical Provider, MD  BIOTIN PO Take 1 tablet by mouth daily.   Yes Historical Provider, MD  buPROPion (WELLBUTRIN SR) 150 MG 12 hr tablet Take 150 mg by mouth 2 (two) times daily.   Yes Historical Provider, MD  Canagliflozin (INVOKANA) 300 MG TABS Take  300 mg by mouth daily.    Yes Historical Provider, MD  diazepam (VALIUM) 10 MG tablet Take 10 mg by mouth every 6 (six) hours as needed for anxiety.   Yes Historical Provider, MD  diltiazem (CARDIZEM CD) 360 MG 24 hr capsule Take 1 capsule (360 mg total) by mouth every morning. 04/01/13  Yes Arnoldo Lenis, MD  flecainide (TAMBOCOR) 100 MG tablet Take 1 tablet (100 mg total) by mouth 2 (two) times daily. 06/28/13  Yes Evans Lance, MD  furosemide (LASIX) 20 MG tablet Take 2 tablets (40 mg total) by mouth every morning. 04/16/13  Yes Orvil Feil, NP  HYDROcodone-acetaminophen (NORCO)  10-325 MG per tablet Take 1 tablet by mouth every 6 (six) hours as needed for pain.   Yes Historical Provider, MD  insulin glargine (LANTUS) 100 units/mL SOLN Inject 50 Units into the skin daily.    Yes Historical Provider, MD  levothyroxine (SYNTHROID, LEVOTHROID) 50 MCG tablet Take 50 mcg by mouth daily before breakfast.    Yes Historical Provider, MD  metFORMIN (GLUCOPHAGE) 1000 MG tablet Take 1,000 mg by mouth 2 (two) times daily with a meal.   Yes Historical Provider, MD  metoprolol tartrate (LOPRESSOR) 25 MG tablet Take 25 mg by mouth 2 (two) times daily.   Yes Historical Provider, MD  Multiple Vitamin (MULTIVITAMIN) tablet Take 1 tablet by mouth daily.   Yes Historical Provider, MD  ondansetron (ZOFRAN) 4 MG tablet Take 1 tablet (4 mg total) by mouth every 8 (eight) hours as needed for nausea or vomiting. 04/16/13  Yes Orvil Feil, NP  pantoprazole (PROTONIX) 40 MG tablet Take 40 mg by mouth every morning.   Yes Historical Provider, MD  rivaroxaban (XARELTO) 20 MG TABS tablet Take 1 tablet (20 mg total) by mouth daily with supper. 11/22/12  Yes Modena Jansky, MD  simvastatin (ZOCOR) 20 MG tablet Take 20 mg by mouth every evening.   Yes Historical Provider, MD  Umeclidinium-Vilanterol [facility] ELLIPTA) 62.5-25 MCG/INH AEPB 1 puff once daily 10/15/13  Yes Collene Gobble, MD  Vitamin D, Ergocalciferol, (DRISDOL) 50000 UNITS CAPS capsule Take 50,000 Units by mouth every 7 (seven) days. Takes on Fridays.   Yes Historical Provider, MD    Allergies as of 11/01/2013 - Review Complete 11/01/2013  Allergen Reaction Noted  . Nitroglycerin Hives, Swelling, and Rash     Family History  Problem Relation Age of Onset  . Stroke Brother   . Cancer Mother   . Arrhythmia Father     History   Social History  . Marital Status: Married    Spouse Name: N/A    Number of Children: N/A  . Years of Education: N/A   Occupational History  . Not on file.   Social History Main Topics  . Smoking status:  Former Smoker -- 3.00 packs/day for 45 years    Types: Cigarettes    Quit date: 03/11/2013  . Smokeless tobacco: Former Systems developer    Quit date: 11/13/2012     Comment: using Welbutrin and e-cigarettes-stopped smoking 12-03-12  . Alcohol Use: No     Comment: "quit alcohol 2011"  . Drug Use: No  . Sexual Activity: Not Currently   Other Topics Concern  . Not on file   Social History Narrative  . No narrative on file    Review of Systems: See HPI, otherwise negative ROS  Physical Exam: BP 134/76  Pulse 85  Temp(Src) 98.4 F (36.9 C) (Oral)  Ht 5'  10" (1.778 m)  Wt 253 lb 12.8 oz (115.123 kg)  BMI 36.42 kg/m2 General:   Alert,  Well-developed, well-nourished, pleasant and cooperative in NAD Skin:  Intact without significant lesions or rashes. Eyes:  Sclera clear, no icterus.   Conjunctiva pink. Ears:  Normal auditory acuity. Nose:  No deformity, discharge,  or lesions. Mouth:  No deformity or lesions. Neck:  Supple; no masses or thyromegaly. No significant cervical adenopathy. Lungs:  Clear throughout to auscultation.   No wheezes, crackles, or rhonchi. No acute distress. Heart:  Regular rate and rhythm; no murmurs, clicks, rubs,  or gallops. Abdomen:   Obese Non-distended, normal bowel sounds.  Soft and nontender without appreciable mass or hepatosplenomegaly.  Pulses:  Normal pulses noted. Extremities:  Without clubbing or edema. psoriatic changes - in both knees.  Impression:   Nash/cirrhosis. GERD/intermittent nausea likely in part due to delay in gastric emptying. Doing fairly well from a GI standpoint. Needs better control of blood sugars. Do screening colonoscopy 2016. Hepatoma screening now. Needs to go on a nonselective beta blocker.  Recommendations:  Hepatic ultrasound  Continue current medical regimen  Will check with Dr. Harl Bowie regarding switching Metoprolol to Inderal or Naldolol  Colonoscopy in 1 year.  Regular exercise and weight loss  Office visit in 6  months        Notice: This dictation was prepared with Dragon dictation along with smaller phrase technology. Any transcriptional errors that result from this process are unintentional and may not be corrected upon review.

## 2013-11-01 NOTE — Telephone Encounter (Signed)
Ralph Lenis, MD at 11/01/2013 10:36 AM     Status: Signed        Received correspondence from GI regarding changing patient to non-selecting beta blocker in setting of liver disease and varices. Ok to stop lopressor, change to nadolol 40mg  daily. Please make patient aware and place Rx.  Zandra Abts MD

## 2013-11-01 NOTE — Telephone Encounter (Signed)
Routing to Dr. Rourk  

## 2013-11-05 ENCOUNTER — Encounter: Payer: Self-pay | Admitting: Cardiovascular Disease

## 2013-11-05 MED ORDER — NADOLOL 40 MG PO TABS: 40.0000 mg | ORAL_TABLET | Freq: Every day | ORAL | Status: DC

## 2013-11-05 NOTE — Telephone Encounter (Signed)
Pt is aware. rx sent to the pharmacy. Pt requested #90 day supply

## 2013-11-06 ENCOUNTER — Ambulatory Visit (HOSPITAL_COMMUNITY)
Admission: RE | Admit: 2013-11-06 | Discharge: 2013-11-06 | Disposition: A | Discharge status: Home / Self Care | Payer: Medicare Other | Source: Ambulatory Visit | Attending: Internal Medicine | Admitting: Internal Medicine

## 2013-11-06 DIAGNOSIS — K7460 Unspecified cirrhosis of liver: Secondary | ICD-10-CM

## 2013-11-20 ENCOUNTER — Ambulatory Visit (INDEPENDENT_AMBULATORY_CARE_PROVIDER_SITE_OTHER): Payer: Medicare Other | Admitting: *Deleted

## 2013-11-20 ENCOUNTER — Telehealth: Payer: Self-pay | Admitting: Cardiology

## 2013-11-20 IMAGING — CR DG CHEST 1V PORT
1 series · 1 of 1 positions shown · non-contrast
Comparison: 05/11/2011

CLINICAL DATA: Pre pacemaker.  Hypertension, diabetic.

PORTABLE CHEST - 1 VIEW

[view not recorded]
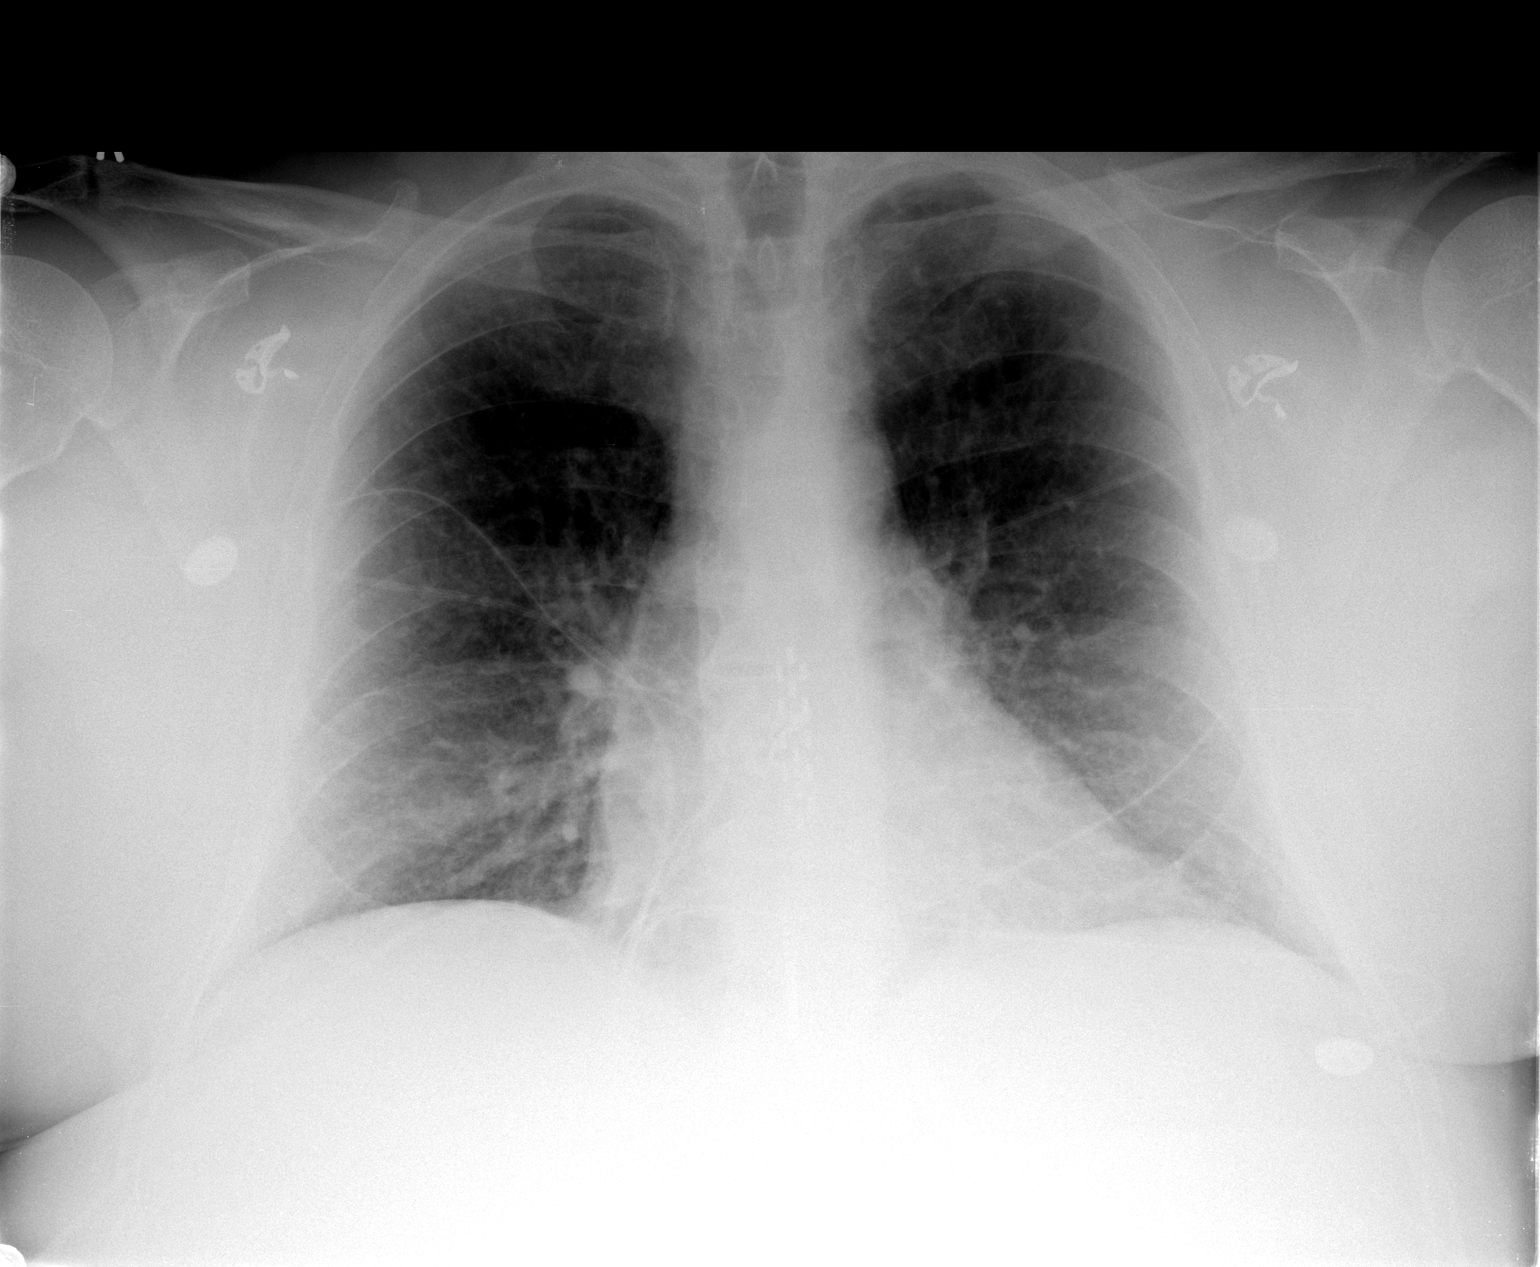

[1 of 1 positions shown; findings below may reference images not displayed]

FINDINGS: Heart and mediastinal contours are within normal limits.
No focal opacities or effusions.  No acute bony abnormality.
Spinal stimulator device noted in the mid thoracic spine.
IMPRESSION: No active cardiopulmonary disease.

## 2013-11-20 NOTE — Telephone Encounter (Signed)
Confirmed remote transmission with pt wife.

## 2013-11-21 ENCOUNTER — Encounter: Payer: Self-pay | Admitting: Cardiology

## 2013-11-22 DIAGNOSIS — I442 Atrioventricular block, complete: Secondary | ICD-10-CM

## 2013-11-22 NOTE — Progress Notes (Signed)
Remote pacemaker transmission.   

## 2013-11-23 LAB — MDC_IDC_ENUM_SESS_TYPE_REMOTE
Battery Remaining Percentage: 64 %
Battery Voltage: 2.93 V
Brady Statistic AP VP Percent: 1 %
Brady Statistic AP VS Percent: 82 %
Brady Statistic AS VP Percent: 1 %
Brady Statistic RA Percent Paced: 83 %
Brady Statistic RV Percent Paced: 1 %
Date Time Interrogation Session: 20150814051333
Implantable Pulse Generator Model: 2210
Implantable Pulse Generator Serial Number: 7393982
Lead Channel Impedance Value: 400 Ohm
Lead Channel Impedance Value: 530 Ohm
Lead Channel Pacing Threshold Amplitude: 0.75 V
Lead Channel Pacing Threshold Pulse Width: 0.4 ms
Lead Channel Sensing Intrinsic Amplitude: 2.3 mV
Lead Channel Sensing Intrinsic Amplitude: 5.5 mV
Lead Channel Setting Sensing Sensitivity: 2 mV
MDC IDC MSMT BATTERY REMAINING LONGEVITY: 77 mo
MDC IDC MSMT LEADCHNL RV PACING THRESHOLD AMPLITUDE: 0.75 V
MDC IDC MSMT LEADCHNL RV PACING THRESHOLD PULSEWIDTH: 0.4 ms
MDC IDC SET LEADCHNL RA PACING AMPLITUDE: 2 V
MDC IDC SET LEADCHNL RV PACING AMPLITUDE: 2.5 V
MDC IDC SET LEADCHNL RV PACING PULSEWIDTH: 0.4 ms
MDC IDC STAT BRADY AS VS PERCENT: 17 %

## 2013-11-27 ENCOUNTER — Encounter: Payer: Self-pay | Admitting: Cardiology

## 2013-11-29 ENCOUNTER — Encounter: Payer: Self-pay | Admitting: Cardiology

## 2013-12-04 ENCOUNTER — Ambulatory Visit (HOSPITAL_BASED_OUTPATIENT_CLINIC_OR_DEPARTMENT_OTHER): Payer: Medicare Other | Attending: Emergency Medicine | Admitting: Radiology

## 2013-12-04 VITALS — Ht 70.0 in | Wt 250.0 lb

## 2013-12-04 DIAGNOSIS — G4730 Sleep apnea, unspecified: Secondary | ICD-10-CM

## 2013-12-04 DIAGNOSIS — G4761 Periodic limb movement disorder: Secondary | ICD-10-CM | POA: Insufficient documentation

## 2013-12-04 DIAGNOSIS — G4733 Obstructive sleep apnea (adult) (pediatric): Secondary | ICD-10-CM

## 2013-12-12 ENCOUNTER — Encounter: Payer: Self-pay | Admitting: Internal Medicine

## 2013-12-19 ENCOUNTER — Encounter: Payer: Self-pay | Admitting: Emergency Medicine

## 2013-12-19 ENCOUNTER — Ambulatory Visit (INDEPENDENT_AMBULATORY_CARE_PROVIDER_SITE_OTHER): Payer: Medicare Other | Admitting: Emergency Medicine

## 2013-12-19 VITALS — BP 110/60 | HR 60 | Temp 97.3°F | Ht 70.0 in | Wt 254.4 lb

## 2013-12-19 DIAGNOSIS — G4733 Obstructive sleep apnea (adult) (pediatric): Secondary | ICD-10-CM

## 2013-12-19 DIAGNOSIS — J449 Chronic obstructive pulmonary disease, unspecified: Secondary | ICD-10-CM

## 2013-12-19 MED ORDER — LEVOFLOXACIN 500 MG PO TABS: 500.0000 mg | ORAL_TABLET | Freq: Every day | ORAL | Status: DC

## 2013-12-19 NOTE — Patient Instructions (Signed)
Please continue Anoro daily Use albuterol 2 puffs as needed for shortness of breath or cough Take levofloxacin 500 mg daily for the next 7 days Start Mucinex 600 mg twice a day We will call you with follow results of your sleep study Follow with Dr Lamonte Sakai in 4 months or sooner if you have any problems.

## 2013-12-19 NOTE — Assessment & Plan Note (Addendum)
Suspected. His final sleep study results are not available but he was not placed on CPAP during the split-night study. Suspect that his sleep apnea is not severe enough to require therapy. We will call him with the final results.

## 2013-12-19 NOTE — Assessment & Plan Note (Signed)
Chronic bronchitis. He may have a component of acute bronchitis as well. I will like to treat him with levofloxacin to see if his purulent sputum resolves. He will continue Anoro, albuterol when necessary. Start guaifenesin. rov 4

## 2013-12-19 NOTE — Addendum Note (Signed)
Addended by: Raymondo Band D on: 12/19/2013 02:51 PM   Modules accepted: Orders

## 2013-12-19 NOTE — Progress Notes (Signed)
Subjective:    Patient ID: Ralph [name], male    DOB: 1950/10/21, 63 y.o.   MRN: [id number]  HPI 63 yo man, former smoker (130pk-yrs), HTN, cirrhosis w varices, OSA not on CPAP, DM, A flutter s/p ablation and pacer, hiatal hernia. He has dysphagia. Has been dx with COPD in 2014 by Dr Luan Pulling. Wears O2 at 2L/min since Summer 2014. He is on Dulera + ProAir prn. He isn't sure they help him. He was once on Spiriva, didn't feels it helped. He has dyspnea that can happen at rest or with exertion. He has nasal congestion and allergies.    PFT 04/2011 > suggests mixed disease, moderate AFL, no BD response, normal volumes, decreased DLCO.   ROV 10/15/13 -- hx COPD, OSA, restrictive dz. Last time we tried changing dulera to anoro, there was not a big difference with the new medication. He uses albuterol 2-3 times a day. He is still having apneic episodes. Waking up in resp distress.  He uses O2 with exertion, wants a portable concentrator. He has a new psoriatic lesion on L knee.   ROV 12/19/13 -- follow up visit for COPD, OSA, restrictive dz.  He underwent PSG, final read not yet available but suspect negative for OSA.  Has daily cough, green mucous. Has good days and bad days. No real wheeze. He uses albuterol about 2 x a day. Dermatology evaluated his psoriasis > steroid cream.    Review of Systems  Constitutional: Negative for fever and unexpected weight change.  HENT: Positive for congestion, postnasal drip, sinus pressure and trouble swallowing. Negative for dental problem, ear pain, nosebleeds, rhinorrhea, sneezing and sore throat.   Eyes: Negative for redness and itching.  Respiratory: Positive for shortness of breath. Negative for cough, chest tightness and wheezing.   Cardiovascular: Positive for chest pain, palpitations and leg swelling.       Hand and feet  Gastrointestinal: Negative for nausea and vomiting.  Genitourinary: Negative for dysuria.  Musculoskeletal: Negative for joint swelling.   Skin: Negative for rash.  Neurological: Negative for headaches.  Hematological: Does not bruise/bleed easily.  Psychiatric/Behavioral: Positive for dysphoric mood. The patient is nervous/anxious.    Past Medical History  Diagnosis Date  . Hypertension   . Cirrhosis     NASH-Hep A and B immune  . Depressed   . Varicose vein     of esophagus  . Difficult intubation     "trouble waking up afterwards" (01/06/2012)  . CHF (congestive heart failure) 01/06/2012  . Sinus pause 01/06/2012    5.2 seconds  . Anginal pain   . Sleep apnea     "don't wear mask" (01/06/2012)  . Emphysema   . Type II diabetes mellitus   . GERD (gastroesophageal reflux disease)   . H/O hiatal hernia   . Coughing up blood     "comes from my throat" (01/06/2012)  . Arthritis     "back; fingers" (01/06/2012)  . Chronic lower back pain   . Fatty liver disease, nonalcoholic   . CHB (complete heart block)   . Presence of permanent cardiac pacemaker 9/292013    [facility]  . Atrial flutter     s/p EPS +RF ablation of typical atrial flutter April 2015     Family History  Problem Relation Age of Onset  . Stroke Brother   . Cancer Mother   . Arrhythmia Father      History   Social History  . Marital Status: Married  Spouse Name: N/A    Number of Children: N/A  . Years of Education: N/A   Occupational History  . Not on file.   Social History Main Topics  . Smoking status: Former Smoker -- 3.00 packs/day for 45 years    Types: Cigarettes    Quit date: 03/11/2013  . Smokeless tobacco: Former Systems developer    Quit date: 11/13/2012     Comment: using Welbutrin and e-cigarettes-stopped smoking 12-03-12  . Alcohol Use: No     Comment: "quit alcohol 2011"  . Drug Use: No  . Sexual Activity: Not Currently   Other Topics Concern  . Not on file   Social History Narrative  . No narrative on file     Allergies  Allergen Reactions  . Nitroglycerin Hives, Swelling and Rash     Outpatient Prescriptions Prior to  Visit  Medication Sig Dispense Refill  . albuterol (PROVENTIL HFA;VENTOLIN HFA) 108 (90 BASE) MCG/ACT inhaler Inhale 2 puffs into the lungs every 6 (six) hours as needed for wheezing.      Marland Kitchen BIOTIN PO Take 1 tablet by mouth daily.      Marland Kitchen buPROPion (WELLBUTRIN SR) 150 MG 12 hr tablet Take 150 mg by mouth 2 (two) times daily.      . Canagliflozin (INVOKANA) 300 MG TABS Take 300 mg by mouth daily.       . diazepam (VALIUM) 10 MG tablet Take 10 mg by mouth every 6 (six) hours as needed for anxiety.      Marland Kitchen diltiazem (CARDIZEM CD) 360 MG 24 hr capsule Take 1 capsule (360 mg total) by mouth every morning.  90 capsule  6  . flecainide (TAMBOCOR) 100 MG tablet Take 1 tablet (100 mg total) by mouth 2 (two) times daily.  90 tablet  3  . furosemide (LASIX) 20 MG tablet Take 2 tablets (40 mg total) by mouth every morning.  180 tablet  3  . HYDROcodone-acetaminophen (NORCO) 10-325 MG per tablet Take 1 tablet by mouth every 6 (six) hours as needed for pain.      Marland Kitchen insulin glargine (LANTUS) 100 units/mL SOLN Inject 50 Units into the skin daily.       Marland Kitchen levothyroxine (SYNTHROID, LEVOTHROID) 50 MCG tablet Take 50 mcg by mouth daily before breakfast.       . metFORMIN (GLUCOPHAGE) 1000 MG tablet Take 1,000 mg by mouth 2 (two) times daily with a meal.      . metoprolol tartrate (LOPRESSOR) 25 MG tablet Take 25 mg by mouth 2 (two) times daily.      . Multiple Vitamin (MULTIVITAMIN) tablet Take 1 tablet by mouth daily.      . nadolol (CORGARD) 40 MG tablet Take 1 tablet (40 mg total) by mouth daily.  90 tablet  3  . ondansetron (ZOFRAN) 4 MG tablet Take 1 tablet (4 mg total) by mouth every 8 (eight) hours as needed for nausea or vomiting.  40 tablet  1  . pantoprazole (PROTONIX) 40 MG tablet Take 40 mg by mouth every morning.      . rivaroxaban (XARELTO) 20 MG TABS tablet Take 1 tablet (20 mg total) by mouth daily with supper.  30 tablet  0  . simvastatin (ZOCOR) 20 MG tablet Take 20 mg by mouth every evening.      Marland Kitchen  Umeclidinium-Vilanterol (ANORO ELLIPTA) 62.5-25 MCG/INH AEPB 1 puff once daily  3 each  6  . Vitamin D, Ergocalciferol, (DRISDOL) 50000 UNITS CAPS capsule Take 50,000 Units by  mouth every 7 (seven) days. Takes on Fridays.       No facility-administered medications prior to visit.         Objective:   Physical Exam Filed Vitals:   12/19/13 1403  BP: 110/60  Pulse: 60  Temp: 97.3 F (36.3 C)  Height: 5\' 10"  (1.778 m)  Weight: 254 lb 6.4 oz (115.395 kg)  SpO2: 92%   Gen: Pleasant, obese, in no distress,  normal affect on O2  ENT: No lesions,  mouth clear,  oropharynx clear, no postnasal drip  Neck: No JVD, no TMG, no carotid bruits  Lungs: No use of accessory muscles, clear without rales or rhonchi  Cardiovascular: RRR, heart sounds normal, no murmur or gallops, trace peripheral edema  Musculoskeletal: No deformities, no cyanosis or clubbing  Neuro: alert, non focal  Skin: Warm, no lesions or rashes    11/21/12 --  Study Conclusions - Left ventricle: The cavity size was normal. Wall thickness was increased in a pattern of mild LVH. There was moderate asymmetric hypertrophy of the septum. Systolic function was normal. The estimated ejection fraction was in the range of 60% to 65%. Wall motion was normal; there were no regional wall motion abnormalities. The study is not technically sufficient to allow evaluation of LV diastolic function. - Mitral valve: Trivial regurgitation. - Left atrium: The atrium was mildly dilated. - Right ventricle: The cavity size was mildly to moderately dilated. Pacer wire or catheter noted in right ventricle. Systolic function was normal. - Right atrium: Central venous pressure: 35mm Hg (est). - Tricuspid valve: Trivial regurgitation. - Pulmonary arteries: PA peak pressure: 36mm Hg (S). - Pericardium, extracardiac: There was no pericardial effusion. Impressions:  - No prior study available for comparison. Mild LVH with moderate  septal hypertrophy, LVEF 60-65%. indeterminate diastolic function. Mild left atrial enlargement. Mild to moderate RV enlargement, device wire noted. Normal PASP and CVP. No pericardial effusion.     Assessment & Plan:  COPD (chronic obstructive pulmonary disease) Chronic bronchitis. He may have a component of acute bronchitis as well. I will like to treat him with levofloxacin to see if his purulent sputum resolves. He will continue Anoro, albuterol when necessary. Start guaifenesin. rov 4  Obstructive sleep apnea Suspected. His final sleep study results are not available but he was not placed on CPAP during the split-night study. Suspect that his sleep apnea is not severe enough to require therapy. We will call him with the final results.

## 2013-12-21 DIAGNOSIS — G4733 Obstructive sleep apnea (adult) (pediatric): Secondary | ICD-10-CM

## 2013-12-21 NOTE — Sleep Study (Signed)
[place]  NAME: Ralph [name] DATE OF BIRTH:  07/22/1950 MEDICAL RECORD NUMBER [id number]  LOCATION: [facility]  PHYSICIAN: Chesley Mires, [name] DATE OF STUDY: 12/04/2013  SLEEP STUDY TYPE: Polysomnogram               REFERRING PHYSICIAN: Collene Gobble, MD  INDICATION FOR STUDY:  Ralph [name] is a 63 y.o. male who has prior history of sleep apnea.  He has not been on therapy.  He has snoring and sleep disruption.  He also has a history of COPD and hypoxemia.  EPWORTH SLEEPINESS SCORE: 13. HEIGHT: 5\' 10"  (177.8 cm)  WEIGHT: 250 lb (113.399 kg)    Body mass index is 35.87 kg/(m^2).  NECK SIZE: 17 in.  MEDICATIONS:  Current Outpatient Prescriptions on File Prior to Visit  Medication Sig Dispense Refill  . albuterol (PROVENTIL HFA;VENTOLIN HFA) 108 (90 BASE) MCG/ACT inhaler Inhale 2 puffs into the lungs every 6 (six) hours as needed for wheezing.      Marland Kitchen BIOTIN PO Take 1 tablet by mouth daily.      Marland Kitchen buPROPion (WELLBUTRIN SR) 150 MG 12 hr tablet Take 150 mg by mouth 2 (two) times daily.      . Canagliflozin (INVOKANA) 300 MG TABS Take 300 mg by mouth daily.       . diazepam (VALIUM) 10 MG tablet Take 10 mg by mouth every 6 (six) hours as needed for anxiety.      Marland Kitchen diltiazem (CARDIZEM CD) 360 MG 24 hr capsule Take 1 capsule (360 mg total) by mouth every morning.  90 capsule  6  . flecainide (TAMBOCOR) 100 MG tablet Take 1 tablet (100 mg total) by mouth 2 (two) times daily.  90 tablet  3  . furosemide (LASIX) 20 MG tablet Take 2 tablets (40 mg total) by mouth every morning.  180 tablet  3  . HYDROcodone-acetaminophen (NORCO) 10-325 MG per tablet Take 1 tablet by mouth every 6 (six) hours as needed for pain.      Marland Kitchen insulin glargine (LANTUS) 100 units/mL SOLN Inject 50 Units into the skin daily.       Marland Kitchen levothyroxine (SYNTHROID, LEVOTHROID) 50 MCG tablet Take 50 mcg by mouth daily before breakfast.       . metFORMIN (GLUCOPHAGE) 1000 MG tablet  Take 1,000 mg by mouth 2 (two) times daily with a meal.      . metoprolol tartrate (LOPRESSOR) 25 MG tablet Take 25 mg by mouth 2 (two) times daily.      . Multiple Vitamin (MULTIVITAMIN) tablet Take 1 tablet by mouth daily.      . nadolol (CORGARD) 40 MG tablet Take 1 tablet (40 mg total) by mouth daily.  90 tablet  3  . ondansetron (ZOFRAN) 4 MG tablet Take 1 tablet (4 mg total) by mouth every 8 (eight) hours as needed for nausea or vomiting.  40 tablet  1  . pantoprazole (PROTONIX) 40 MG tablet Take 40 mg by mouth every morning.      . rivaroxaban (XARELTO) 20 MG TABS tablet Take 1 tablet (20 mg total) by mouth daily with supper.  30 tablet  0  . simvastatin (ZOCOR) 20 MG tablet Take 20 mg by mouth every evening.      Marland Kitchen Umeclidinium-Vilanterol (ANORO ELLIPTA) 62.5-25 MCG/INH AEPB 1 puff once daily  3 each  6  . Vitamin D, Ergocalciferol, (DRISDOL) 50000 UNITS CAPS capsule Take 50,000 Units by mouth every  7 (seven) days. Takes on Fridays.       No current facility-administered medications on file prior to visit.    SLEEP ARCHITECTURE:  Total recording time: 367 minutes.  Total sleep time was: 322 minutes.  Sleep efficiency: 87.7%.  Sleep latency: 19.5 minutes.  REM latency: 66.5 minutes.  Stage N1: 7.5%.  Stage N2: 72.2%.  Stage N3: 0.3%.  Stage R:  80%.  Supine sleep: 0 minutes.  Non-supine sleep: 322 minutes.  CARDIAC DATA:  Average heart rate: 70 beats per minute. Rhythm strip: sinus rhythm with occasional PVC's.  RESPIRATORY DATA: Average respiratory rate: 18. Snoring: mild. Average AHI: 0.4.   Apnea index: 0.2.  Hypopnea index: 0.2. Obstructive apnea index: 0.2.  Central apnea index: 0.  Mixed apnea index: 0. REM AHI: 0.  NREM AHI: 0.5. Supine AHI: 0. Non-supine AHI: 0.3.  MOVEMENT/PARASOMNIA:  Periodic limb movement: 83.1.  Period limb movements with arousals: 5.6. Restroom trips: none.  OXYGEN DATA:  Baseline oxygenation: 92%. Lowest SaO2: 93%. Time spent below SaO2  90%: 0 minutes. Supplemental oxygen used: study was done with patient using 2 liters oxygen.  IMPRESSION/ RECOMMENDATION:   This study did not show obstructive or central sleep apnea.  He had good control of his oxygenation while using 2 liters oxygen.  He had an increase in his periodic limb movement index.  Clinical correlation needed to determine the significance of this.  Chesley Mires, [name] Diplomate, [employer] adviser of Sleep Medicine  ELECTRONICALLY SIGNED ON:  12/21/2013, 11:15 AM [place] PH: [phone]   FX: [phone] [place]

## 2014-01-01 ENCOUNTER — Other Ambulatory Visit: Payer: Self-pay | Admitting: Internal Medicine

## 2014-01-16 ENCOUNTER — Encounter: Payer: Medicare Other | Attending: "Endocrinology | Admitting: Nutrition

## 2014-01-16 VITALS — Ht 70.0 in | Wt 250.4 lb

## 2014-01-16 DIAGNOSIS — Z794 Long term (current) use of insulin: Secondary | ICD-10-CM | POA: Diagnosis not present

## 2014-01-16 DIAGNOSIS — Z713 Dietary counseling and surveillance: Secondary | ICD-10-CM | POA: Diagnosis not present

## 2014-01-16 DIAGNOSIS — E1165 Type 2 diabetes mellitus with hyperglycemia: Secondary | ICD-10-CM | POA: Diagnosis not present

## 2014-01-16 NOTE — Progress Notes (Signed)
  Medical Nutrition Therapy:  Appt start time: 1430 end time:  6314.   Assessment:  Primary concerns today: Diabetes.  Lives with his wife. Retired. FBS 130-190's  Before lunch: 147 mg/dl 205 mg/dl HS Most recent A1C 9.8%. He wants to get his A1C down close to 7%. He does a lot of the cooking and shopping. On oxygen. [place] yards for exercise and some weed eating. Admits to eating late at night. WHen he takes his insulin at night, he has a lot of hunger.  Preferred Learning Style:   Auditory  Hands on  No preference indicated   Learning Readiness:    Ready  Change in progress  MEDICATIONS: see list   DIETARY INTAKE:  24-hr recall:  B ( AM): 2 eggs,  2 slices bacon or 2 sausage, fruit 2 cups coffee  Snk ( AM): none  L ( PM): [employer] [place] Chicken- chicken fried, stirfry vegetables, hot peppers, Unswt tea 16 oz Snk ( PM): D ( PM): More of the same from lunch.water to drink Snk ( PM):  ice cream 1 cup, water Beverages: water I  Usual physical activity: Walks  Estimated energy needs: 1800 calories 200 g carbohydrates 135 g protein 50 g fat  Progress Towards Goal(s):  In progress.   Nutritional Diagnosis:  Food and nutrition knowledge deficit related to uncontrolled Diabetes as evidenced by A1C of 9.8%.    Intervention:  Nutrition counseling and diabetes education.  Plan:  Aim for 3-4Carb Choices per meal ( 45-60 grams) +/- 1 either way  Follow the PLate Method. Include protein in moderation with your meals and snacks Consider reading food labels for Total Carbohydrate and Fat Grams of foods Consider  increasing your activity level by 15 minutes daily as tolerated Consider checking BG at alternate times per day as directed by MD  Consider taking medication as directed by MD Goal:  Get A1C to 7% in three months. 2. Lose 1 per week.  3. Increase raw vegetables. 4. Only eat a piece of fruit or yogurt when taking insulin at night if hungry.  Teaching Method Utilized:   Visual Auditory Hands on  Handouts given during visit include:  My Plate Carb Counting and Food Label handouts Meal Plan Card  Barriers to learning/adherence to lifestyle change: breathing on oxygen and limited physical activity  Demonstrated degree of understanding via:  Teach Back   Monitoring/Evaluation:  Dietary intake, exercise, meal planning, SBG, and body weight in 2 week(s).

## 2014-01-16 NOTE — Patient Instructions (Signed)
Plan:  Aim for 3-4Carb Choices per meal ( 45-60 grams) +/- 1 either way  Follow the PLate Method. Include protein in moderation with your meals and snacks Consider reading food labels for Total Carbohydrate and Fat Grams of foods Consider  increasing your activity level by 15 minutes daily as tolerated Consider checking BG at alternate times per day as directed by MD  Consider taking medication as directed by MD Goal:  Get A1C to 7% in three months. 2. Lose 1 per week.  3. Increase raw vegetables.

## 2014-01-29 ENCOUNTER — Telehealth: Payer: Self-pay

## 2014-01-29 ENCOUNTER — Other Ambulatory Visit: Payer: Self-pay | Admitting: Internal Medicine

## 2014-01-29 ENCOUNTER — Other Ambulatory Visit: Payer: Self-pay

## 2014-01-29 DIAGNOSIS — K7469 Other cirrhosis of liver: Secondary | ICD-10-CM

## 2014-01-29 NOTE — Telephone Encounter (Signed)
RMR received and reviewed labs from [facility]. He recommending pt repeat LFT's. Lab order and letter have been mailed to the pt. Labs have been sent to be scanned.

## 2014-01-31 ENCOUNTER — Encounter: Payer: Medicare Other | Attending: "Endocrinology | Admitting: Nutrition

## 2014-01-31 ENCOUNTER — Encounter: Payer: Self-pay | Admitting: Nutrition

## 2014-01-31 VITALS — Ht 70.0 in | Wt 253.0 lb

## 2014-01-31 DIAGNOSIS — E1165 Type 2 diabetes mellitus with hyperglycemia: Secondary | ICD-10-CM

## 2014-01-31 DIAGNOSIS — Z713 Dietary counseling and surveillance: Secondary | ICD-10-CM | POA: Insufficient documentation

## 2014-01-31 DIAGNOSIS — E118 Type 2 diabetes mellitus with unspecified complications: Secondary | ICD-10-CM | POA: Diagnosis not present

## 2014-01-31 DIAGNOSIS — Z794 Long term (current) use of insulin: Secondary | ICD-10-CM | POA: Diagnosis not present

## 2014-01-31 DIAGNOSIS — IMO0002 Reserved for concepts with insufficient information to code with codable children: Secondary | ICD-10-CM

## 2014-01-31 NOTE — Progress Notes (Signed)
   Medical Nutrition Therapy:  Appt start time: 1430 end time:  7408.   Assessment:  Primary concerns today: Diabetes.   His mom died earlier this week. Has been more conscious of what he is eating. Eating more fresh foods.  Brought food journal and BS log with him. BS look better overall FBS 140-160's and PM upper 100's. However, he is not eating enough carbohydrates at some meals. Eats better protein and vegetables.Gained 3 lbs but he thinks it's because of the salty food and lack of activity recently due to the funeral.  States when he takes his PM long acting insulin, it makes him extremely hungry.  Preferred Learning Style:   Auditory  Hands on  No preference indicated   Learning Readiness:    Ready  Change in progress  MEDICATIONS: see list   DIETARY INTAKE:  24-hr recall:  B ( AM): 2 eggs,  2 slices bacon or 2 sausage, fruit 2 cups coffee  Snk ( AM): none  L ( PM): Chinese Chicken- chicken fried, stirfry vegetables, hot peppers, Unswt tea 16 oz Prepared at home Snk ( PM): D ( PM): More of the same from lunch.water to drink Snk ( PM):  ice cream 1 cup, water Beverages: water I  Usual physical activity: Walks  Estimated energy needs: 1800 calories 200 g carbohydrates 135 g protein 50 g fat  Progress Towards Goal(s):  In progress.   Nutritional Diagnosis:  Food and nutrition knowledge deficit related to uncontrolled Diabetes as evidenced by A1C of 9.8%.    Intervention:  Nutrition counseling and diabetes education.  Plan:  Aim for 3-4Carb Choices per meal ( 45-60 grams) +/- 1 either way  Make sure you eating minimum of 30-45 g of carbs per meal. Skip snacks between meals. Follow the PLate Method. Include protein in moderation with your meals  Continue reading food labels for Total Carbohydrate  Consider  increasing your activity level by 30  minutes daily as tolerated Continue checking BG 4 times a day. Consider taking medication as directed by  MD Goal:  Get A1C to 7% in three months. 2. Lose 1 per week.  3. Increase raw vegetables. 4. Only eat a piece of fruit or yogurt when taking insulin at night if hungry.  Teaching Method Utilized:  Visual Auditory Hands on  Handouts given during visit include:  My Plate Carb Counting and Food Label handouts Meal Plan Card  Barriers to learning/adherence to lifestyle change: breathing on oxygen and limited physical activity  Demonstrated degree of understanding via:  Teach Back   Monitoring/Evaluation:  Dietary intake, exercise, meal planning, SBG, and body weight in 1 month.Marland Kitchen

## 2014-02-05 ENCOUNTER — Other Ambulatory Visit: Payer: Self-pay | Admitting: Internal Medicine

## 2014-02-05 LAB — HEPATIC FUNCTION PANEL
ALBUMIN: 4.4 g/dL (ref 3.5–5.2)
ALT: 40 U/L (ref 0–53)
AST: 29 U/L (ref 0–37)
Alkaline Phosphatase: 173 U/L — ABNORMAL HIGH (ref 39–117)
BILIRUBIN DIRECT: 0.1 mg/dL (ref 0.0–0.3)
Indirect Bilirubin: 0.4 mg/dL (ref 0.2–1.2)
Total Bilirubin: 0.5 mg/dL (ref 0.2–1.2)
Total Protein: 7.3 g/dL (ref 6.0–8.3)

## 2014-02-05 NOTE — Patient Instructions (Signed)
Plan:  Aim for 3-4Carb Choices per meal ( 45-60 grams) +/- 1 either way  Make sure you eating minimum of 30-45 g of carbs per meal. Skip snacks between meals. Follow the PLate Method. Include protein in moderation with your meals  Continue reading food labels for Total Carbohydrate  Consider  increasing your activity level by 30  minutes daily as tolerated Continue checking BG 4 times a day. Consider taking medication as directed by MD Goal:  Get A1C to 7% in three months. 2. Lose 1 per week.  3. Increase raw vegetables. 4. Only eat a piece of fruit or yogurt when taking insulin at night if hungry.

## 2014-02-11 ENCOUNTER — Other Ambulatory Visit: Payer: Self-pay | Admitting: Internal Medicine

## 2014-02-11 DIAGNOSIS — K7460 Unspecified cirrhosis of liver: Secondary | ICD-10-CM

## 2014-02-24 ENCOUNTER — Ambulatory Visit (INDEPENDENT_AMBULATORY_CARE_PROVIDER_SITE_OTHER): Payer: Medicare Other | Admitting: *Deleted

## 2014-02-24 DIAGNOSIS — I442 Atrioventricular block, complete: Secondary | ICD-10-CM

## 2014-02-24 NOTE — Progress Notes (Signed)
Remote pacemaker transmission.   

## 2014-02-25 LAB — MDC_IDC_ENUM_SESS_TYPE_REMOTE
Battery Remaining Longevity: 80 mo
Battery Voltage: 2.93 V
Brady Statistic AP VS Percent: 85 %
Brady Statistic AS VP Percent: 1 %
Brady Statistic RA Percent Paced: 86 %
Brady Statistic RV Percent Paced: 1 %
Lead Channel Impedance Value: 530 Ohm
Lead Channel Pacing Threshold Amplitude: 0.75 V
Lead Channel Pacing Threshold Amplitude: 0.75 V
Lead Channel Sensing Intrinsic Amplitude: 3.7 mV
Lead Channel Setting Pacing Amplitude: 2 V
Lead Channel Setting Pacing Amplitude: 2.5 V
Lead Channel Setting Sensing Sensitivity: 2 mV
MDC IDC MSMT BATTERY REMAINING PERCENTAGE: 67 %
MDC IDC MSMT LEADCHNL RA IMPEDANCE VALUE: 400 Ohm
MDC IDC MSMT LEADCHNL RA PACING THRESHOLD PULSEWIDTH: 0.4 ms
MDC IDC MSMT LEADCHNL RA SENSING INTR AMPL: 3 mV
MDC IDC MSMT LEADCHNL RV PACING THRESHOLD PULSEWIDTH: 0.4 ms
MDC IDC PG SERIAL: 7393982
MDC IDC SESS DTM: 20151116074546
MDC IDC SET LEADCHNL RV PACING PULSEWIDTH: 0.4 ms
MDC IDC STAT BRADY AP VP PERCENT: 1 %
MDC IDC STAT BRADY AS VS PERCENT: 14 %

## 2014-02-28 ENCOUNTER — Ambulatory Visit: Payer: Medicare Other | Admitting: Nutrition

## 2014-03-04 ENCOUNTER — Other Ambulatory Visit: Payer: Self-pay | Admitting: Internal Medicine

## 2014-03-04 ENCOUNTER — Other Ambulatory Visit: Payer: Self-pay

## 2014-03-05 ENCOUNTER — Encounter: Payer: Self-pay | Admitting: Cardiology

## 2014-03-05 MED ORDER — FUROSEMIDE 20 MG PO TABS: 40.0000 mg | ORAL_TABLET | Freq: Every morning | ORAL | Status: DC

## 2014-03-05 MED ORDER — SPIRONOLACTONE 50 MG PO TABS: 50.0000 mg | ORAL_TABLET | Freq: Two times a day (BID) | ORAL | Status: DC

## 2014-03-10 ENCOUNTER — Other Ambulatory Visit: Payer: Self-pay

## 2014-03-10 MED ORDER — FUROSEMIDE 20 MG PO TABS: 40.0000 mg | ORAL_TABLET | Freq: Every morning | ORAL | Status: DC

## 2014-03-20 ENCOUNTER — Encounter (HOSPITAL_COMMUNITY): Payer: Self-pay | Admitting: Cardiovascular Disease

## 2014-03-20 ENCOUNTER — Encounter: Payer: Self-pay | Admitting: Internal Medicine

## 2014-03-25 ENCOUNTER — Encounter: Payer: Self-pay | Admitting: Internal Medicine

## 2014-04-09 ENCOUNTER — Encounter: Payer: Self-pay | Admitting: Internal Medicine

## 2014-04-21 ENCOUNTER — Encounter: Payer: Self-pay | Admitting: Internal Medicine

## 2014-04-21 ENCOUNTER — Other Ambulatory Visit: Payer: Self-pay

## 2014-04-22 MED ORDER — SPIRONOLACTONE 50 MG PO TABS: 50.0000 mg | ORAL_TABLET | Freq: Two times a day (BID) | ORAL | Status: DC

## 2014-05-07 ENCOUNTER — Telehealth: Payer: Self-pay

## 2014-05-07 ENCOUNTER — Other Ambulatory Visit: Payer: Self-pay

## 2014-05-07 ENCOUNTER — Encounter: Payer: Self-pay | Admitting: Gastroenterology

## 2014-05-07 ENCOUNTER — Ambulatory Visit (INDEPENDENT_AMBULATORY_CARE_PROVIDER_SITE_OTHER): Payer: Medicare HMO | Admitting: Gastroenterology

## 2014-05-07 VITALS — BP 119/71 | HR 81 | Temp 97.4°F | Ht 70.0 in | Wt 255.2 lb

## 2014-05-07 DIAGNOSIS — K7460 Unspecified cirrhosis of liver: Secondary | ICD-10-CM

## 2014-05-07 DIAGNOSIS — Z1211 Encounter for screening for malignant neoplasm of colon: Secondary | ICD-10-CM | POA: Insufficient documentation

## 2014-05-07 DIAGNOSIS — R1013 Epigastric pain: Secondary | ICD-10-CM

## 2014-05-07 DIAGNOSIS — R1314 Dysphagia, pharyngoesophageal phase: Secondary | ICD-10-CM

## 2014-05-07 DIAGNOSIS — K7469 Other cirrhosis of liver: Secondary | ICD-10-CM

## 2014-05-07 MED ORDER — PEG 3350-KCL-NA BICARB-NACL 420 G PO SOLR: 4000.0000 mL | Freq: Once | ORAL | Status: DC

## 2014-05-07 NOTE — Telephone Encounter (Signed)
Doris, can we make sure patient is aware? I put it on his AVS, but I just want to make sure.

## 2014-05-07 NOTE — Telephone Encounter (Signed)
Dr. Harl Bowie,   This mutual pt was seen in our office today by Laban Emperor, NP. We would like to schedule him for Colonoscopy ( screening) and an EGD with Dilation for dysphagia and dyspepsia with Dr.Rourk in the very near future.   Please advise if it is OK to hold Xarelto for 3 days prior to procedure.

## 2014-05-07 NOTE — Assessment & Plan Note (Signed)
EGD as planned. 

## 2014-05-07 NOTE — Progress Notes (Signed)
Referring Provider: Elsie Lincoln, MD Primary Care Physician:  Purvis Kilts, MD  Primary GI: Dr. Gala Romney   Chief Complaint  Patient presents with  . Elevated Hepatic Enzymes    HPI:   Ralph [name] is a 64 y.o. male presenting today with a history of NASH cirrhosis, GERD, probable gastroparesis. Due for average risk screening colonoscopy this year. Last US abdomen in July 2015. Needs HCC screening.   2 liters O2 continuous. Notes upper abdominal discomfort after eating for a few months. Occasional nausea. No melena. Early satiety. Notes recurrent solid food dysphagia. Last EGD in Nov 2014. Grade 1 esophageal varices. Abnormal distal esophagus/status post biopsy after Maloney dilation. Portal gastropathy. Antral erosions-status post biopsy. path negative for H.pylori, benign path.Notes improvement after that dilation. No constipation, diarrhea. Sometimes trouble emptying bladder. No mental status changes. Remains on Xarelto. On Nadolol for variceal prophylaxis.   Past Medical History  Diagnosis Date  . Hypertension   . Cirrhosis     NASH-Hep A and B immune  . Depressed   . Varicose vein     of esophagus  . Difficult intubation     "trouble waking up afterwards" (01/06/2012)  . CHF (congestive heart failure) 01/06/2012  . Sinus pause 01/06/2012    5.2 seconds  . Anginal pain   . Sleep apnea     "don't wear mask" (01/06/2012)  . Emphysema   . Type II diabetes mellitus   . GERD (gastroesophageal reflux disease)   . H/O hiatal hernia   . Coughing up blood     "comes from my throat" (01/06/2012)  . Arthritis     "back; fingers" (01/06/2012)  . Chronic lower back pain   . Fatty liver disease, nonalcoholic   . CHB (complete heart block)   . Presence of permanent cardiac pacemaker 9/292013    [employer]  . Atrial flutter     s/p EPS +RF ablation of typical atrial flutter April 2015    Past Surgical History  Procedure Laterality Date  . Back surgery    . Spinal cord  stimulator implant  2006  . Cholecystectomy  1993  . Nasal septum surgery  1992  . Tonsillectomy and adenoidectomy  1992  . Posterior fusion lumbar spine  1999    L4-5  . Lumbar disc surgery  1994; ~ 1995; ~ 1996  . Warthin's tumor excision  1990's    right  . Permanent pacemaker insertion  01/08/2012    CHB  . US echocardiography  12/28/2011    mild LVH,mild mitral annulara ca+,mild MR  . Nuclear stress test  10/19/2004    No ischemia  . Esophagogastroduodenoscopy  11/08/2004    ZMO:QHUTML esophageal erosions consistent with erosive reflux esophagitis/Areas of hemorrhage and nodularity of the fundal mucosa of uncertain significance, biopsied.  Small hiatal hernia, otherwise normal stomach  . Colonoscopy  11/08/2004    YYT:[id number] rectum, colon, TI.  Marland Kitchen Esophagogastroduodenoscopy  2010    Dr. Gala Romney: 3 columns Grade 1 varices, erosive esophagitis, HH, portal gastropathy, normal D1, D2  . Esophagogastroduodenoscopy (egd) with esophageal dilation N/A 02/14/2013    KCL:EXNTZ 1 esophageal varices. Abnormal distal esophagus/status post biopsy after Maloney dilation. Portal gastropathy. Antral erosions-status post biopsy. path negative for H.pylori, benign path.  . Permanent pacemaker insertion N/A 01/09/2012    Procedure: PERMANENT PACEMAKER INSERTION;  Surgeon: Sanda Klein, MD;  Location: [facility] CATH [facility];  Service: Cardiovascular;  Laterality: N/A;  . Atrial flutter ablation N/A 07/10/2013  Procedure: ATRIAL FLUTTER ABLATION;  Surgeon: Evans Lance, MD;  Location: [facility] C[facility];  Service: Cardiovascular;  Laterality: N/A;    Current Outpatient Prescriptions  Medication Sig Dispense Refill  . albuterol (PROVENTIL HFA;VENTOLIN HFA) 108 (90 BASE) MCG/ACT inhaler Inhale 2 puffs into the lungs every 6 (six) hours as needed for wheezing.    Marland Kitchen buPROPion (WELLBUTRIN SR) 150 MG 12 hr tablet Take 150 mg by mouth 2 (two) times daily.    . Canagliflozin (INVOKANA) 300 MG TABS Take 300 mg by mouth daily.       . diazepam (VALIUM) 10 MG tablet Take 10 mg by mouth every 6 (six) hours as needed for anxiety.    Marland Kitchen diltiazem (CARDIZEM CD) 360 MG 24 hr capsule Take 1 capsule (360 mg total) by mouth every morning. 90 capsule 6  . flecainide (TAMBOCOR) 100 MG tablet TAKE 1 TABLET BY MOUTH TWICE A DAY 90 tablet 1  . flecainide (TAMBOCOR) 100 MG tablet TAKE 1 TABLET BY MOUTH TWICE A DAY 90 tablet 3  . furosemide (LASIX) 20 MG tablet Take 2 tablets (40 mg total) by mouth every morning. 60 tablet 3  . HYDROcodone-acetaminophen (NORCO) 10-325 MG per tablet Take 1 tablet by mouth every 6 (six) hours as needed for pain.    Marland Kitchen insulin aspart (NOVOLOG) 100 UNIT/ML injection Inject 10-15 Units into the skin 3 (three) times daily before meals.    . insulin glargine (LANTUS) 100 units/mL SOLN Inject 70 Units into the skin daily.     Marland Kitchen levothyroxine (SYNTHROID, LEVOTHROID) 50 MCG tablet Take 50 mcg by mouth daily before breakfast.     . metFORMIN (GLUCOPHAGE) 1000 MG tablet Take 1,000 mg by mouth 2 (two) times daily with a meal.    . Multiple Vitamin (MULTIVITAMIN) tablet Take 1 tablet by mouth daily.    . nadolol (CORGARD) 40 MG tablet Take 1 tablet (40 mg total) by mouth daily. 90 tablet 3  . ondansetron (ZOFRAN) 4 MG tablet Take 1 tablet (4 mg total) by mouth every 8 (eight) hours as needed for nausea or vomiting. 40 tablet 1  . pantoprazole (PROTONIX) 40 MG tablet Take 40 mg by mouth every morning.    . rivaroxaban (XARELTO) 20 MG TABS tablet Take 1 tablet (20 mg total) by mouth daily with supper. 30 tablet 0  . simvastatin (ZOCOR) 20 MG tablet Take 20 mg by mouth every evening.    Marland Kitchen spironolactone (ALDACTONE) 50 MG tablet Take 1 tablet (50 mg total) by mouth 2 (two) times daily. 60 tablet 5  . Umeclidinium-Vilanterol (ANORO ELLIPTA) 62.5-25 MCG/INH AEPB 1 puff once daily 3 each 6   No current facility-administered medications for this visit.    Allergies as of 05/07/2014 - Review Complete 05/07/2014  Allergen  Reaction Noted  . Nitroglycerin Hives, Swelling, and Rash     Family History  Problem Relation Age of Onset  . Stroke Brother   . Cancer Mother   . Arrhythmia Father     History   Social History  . Marital Status: Married    Spouse Name: N/A    Number of Children: N/A  . Years of Education: N/A   Social History Main Topics  . Smoking status: Former Smoker -- 3.00 packs/day for 45 years    Types: Cigarettes    Quit date: 03/11/2013  . Smokeless tobacco: Former Systems developer    Quit date: 11/13/2012     Comment: using Welbutrin and e-cigarettes-stopped smoking 12-03-12  . Alcohol  Use: No     Comment: "quit alcohol 2011"  . Drug Use: No  . Sexual Activity: Not Currently   Other Topics Concern  . None   Social History Narrative    Review of Systems: Negative unless mentioned in HPI.   Physical Exam: BP 119/71 mmHg  Pulse 81  Temp(Src) 97.4 F (36.3 C) (Oral)  Ht $R'5\' 10"'si$  (1.778 m)  Wt 255 lb 3.2 oz (115.758 kg)  BMI 36.62 kg/m2 General:   Alert and oriented. No distress noted. Pleasant and cooperative.  Head:  Normocephalic and atraumatic. Eyes:  Conjuctiva clear without scleral icterus. Mouth:  Oral mucosa pink and moist.  Heart:  S1, S2 present without murmurs Abdomen:  +BS, soft, obese, mild TTP upper abdomen,  non-distended. No rebound or guarding.  Extremities:  Without edema. Neurologic:  Alert and  oriented x4;  grossly normal neurologically. Skin:  Intact without significant lesions or rashes. Psych:  Alert and cooperative. Normal mood and affect.  Outside labs Nov 2015:  Tbili 0.4, Alk Phos 182, AST 29, ALT 48

## 2014-05-07 NOTE — Telephone Encounter (Signed)
Noted and pt aware. Pt is scheduled for 05/28/2014 with RMR. Mailed instructions

## 2014-05-07 NOTE — Patient Instructions (Addendum)
We have scheduled you for a colonoscopy, upper endoscopy, and dilation with Dr. Gala Romney in the near future.  HOLD XARELTO FOR 3 DAYS PRIOR TO THE PROCEDURE. Take 1/2 dose of oral diabetes medication the day before and NONE the day of. 1/2 dose of insulin the night before and none the day of.   We have also ordered an ultrasound of your liver.

## 2014-05-07 NOTE — Assessment & Plan Note (Addendum)
65 year old male with history of NASH cirrhosis presenting with recurrent solid food dysphagia and vague dyspepsia, with last EGD in Nov 2014 and improvement after dilation. He remains on nadolol for variceal prophylaxis. EGD planned for repeat dilation and assessment of dyspepsia; query web, ring, stricture as culprit for dysphagia but unable to exclude esophageal dysmotility. Dyspepsia could be secondary to gastritis, PUD, NUD. Gallbladder absent.   Proceed with upper endoscopy/dilation in the near future with Dr. Gala Romney. The risks, benefits, and alternatives have been discussed in detail with patient. They have stated understanding and desire to proceed.  HOLD XARELTO X 3 DAYS PRIOR and resume the day after the procedure (cardiology aware) Consider BPE if persistent dysphagia

## 2014-05-07 NOTE — Progress Notes (Signed)
cc'ed to pcp °

## 2014-05-07 NOTE — Telephone Encounter (Signed)
Hold xarelto 3 days prior to procedure is fine, would resume 1 day after procedure.    Zandra Abts MD

## 2014-05-07 NOTE — Telephone Encounter (Signed)
Noted  

## 2014-05-07 NOTE — Telephone Encounter (Signed)
Per Freida Busman, she told pt OK to hold the Xarelto for 3 days prior to procedure.

## 2014-05-07 NOTE — Assessment & Plan Note (Addendum)
Due for routine average risk screening colonoscopy (last in 2006). No concerning lower GI symptoms.    Proceed with TCS with Dr. Gala Romney in near future: the risks, benefits, and alternatives have been discussed with the patient in detail. The patient states understanding and desires to proceed. HOLD XARELTO X 3 DAYS prior to procedure and resume the day after (cardiology aware).

## 2014-05-07 NOTE — Telephone Encounter (Signed)
Great, thanks

## 2014-05-07 NOTE — Assessment & Plan Note (Signed)
Well-compensated. Needs [place] for [facility] screening. To be arranged.

## 2014-05-12 ENCOUNTER — Other Ambulatory Visit (HOSPITAL_COMMUNITY): Payer: Medicare Other

## 2014-05-13 ENCOUNTER — Telehealth: Payer: Self-pay | Admitting: Internal Medicine

## 2014-05-13 ENCOUNTER — Ambulatory Visit (HOSPITAL_COMMUNITY)
Admission: RE | Admit: 2014-05-13 | Discharge: 2014-05-13 | Disposition: A | Discharge status: Home / Self Care | Payer: Medicare HMO | Source: Ambulatory Visit | Attending: Gastroenterology | Admitting: Gastroenterology

## 2014-05-13 DIAGNOSIS — R109 Unspecified abdominal pain: Secondary | ICD-10-CM | POA: Insufficient documentation

## 2014-05-13 DIAGNOSIS — K7460 Unspecified cirrhosis of liver: Secondary | ICD-10-CM | POA: Insufficient documentation

## 2014-05-13 NOTE — Telephone Encounter (Signed)
ON RECALL LIST FOR ULTRASOUND ABDOMEN

## 2014-05-13 NOTE — Telephone Encounter (Signed)
Pt had US today.

## 2014-05-20 ENCOUNTER — Encounter: Payer: Self-pay | Admitting: Gastroenterology

## 2014-05-20 NOTE — Progress Notes (Signed)
Quick Note:  No HCC identified. Repeat in 6 months. ______

## 2014-05-21 ENCOUNTER — Ambulatory Visit (INDEPENDENT_AMBULATORY_CARE_PROVIDER_SITE_OTHER): Payer: Medicare HMO | Admitting: Cardiology

## 2014-05-21 ENCOUNTER — Encounter: Payer: Self-pay | Admitting: Cardiology

## 2014-05-21 VITALS — BP 128/72 | HR 68 | Ht 70.0 in | Wt 257.0 lb

## 2014-05-21 DIAGNOSIS — I4891 Unspecified atrial fibrillation: Secondary | ICD-10-CM

## 2014-05-21 DIAGNOSIS — R42 Dizziness and giddiness: Secondary | ICD-10-CM

## 2014-05-21 DIAGNOSIS — I10 Essential (primary) hypertension: Secondary | ICD-10-CM

## 2014-05-21 DIAGNOSIS — E785 Hyperlipidemia, unspecified: Secondary | ICD-10-CM

## 2014-05-21 MED ORDER — SPIRONOLACTONE 50 MG PO TABS: 50.0000 mg | ORAL_TABLET | Freq: Two times a day (BID) | ORAL | Status: DC

## 2014-05-21 MED ORDER — DILTIAZEM HCL ER COATED BEADS 240 MG PO CP24: 240.0000 mg | ORAL_CAPSULE | Freq: Every morning | ORAL | Status: DC

## 2014-05-21 MED ORDER — SIMVASTATIN 20 MG PO TABS: 20.0000 mg | ORAL_TABLET | Freq: Every evening | ORAL | Status: DC

## 2014-05-21 NOTE — Patient Instructions (Signed)
Your physician wants you to follow-up in: 6 months with Dr Bryna Colander will receive a reminder letter in the mail two months in advance. If you don't receive a letter, please call our office to schedule the follow-up appointment.   Your physician has recommended you make the following change in your medication:      DECREASE Diltiazem 240 mg daily    Please get FASTING lipid blood work   I refilled your medications      Thank you for choosing [facility] !

## 2014-05-21 NOTE — Progress Notes (Signed)
Clinical Summary Mr. Lease is a 64 y.o.male see today for follow up of the following medical problems.   1. History of sinus arrest  - St Jude dual chamber pacemaker pacemaker implanted Sept 2013 ([place] [address] RF).  - device check 02/2014 with normal function, no arrhythmias noted - notes some lightheadness/dizziness at times mainly with standing or walking. Reports stays well hydrated, drinks 10 glasses a day. Occurs 4-5 times a day.   2. Afib/aflutter - previous side effects on multaq - seen by EP 06/28/13, started on flecanide. Continued to have symptoms. Had RF ablation of flutter by Dr Lovena Le 07/10/13. - denies any palpitations since procdure - on nadolol for history of palpitations as well as esoph varices  3. HTN  - compliant w/ meds  - checks bp at home once daily, typically 110s/70s    4. HL  - compliant with simva  - no recent panel in our system  5. NASH cirrhosis  - followed by GI  - EGD 03/2013 with grade I varices, no history of bleeding on anticoag    6. COPD  - compliant with inhalers and home oxygen.  Past Medical History  Diagnosis Date  . Hypertension   . Cirrhosis     NASH-Hep A and B immune  . Depressed   . Varicose vein     of esophagus  . Difficult intubation     "trouble waking up afterwards" (01/06/2012)  . CHF (congestive heart failure) 01/06/2012  . Sinus pause 01/06/2012    5.2 seconds  . Anginal pain   . Sleep apnea     "don't wear mask" (01/06/2012)  . Emphysema   . Type II diabetes mellitus   . GERD (gastroesophageal reflux disease)   . H/O hiatal hernia   . Coughing up blood     "comes from my throat" (01/06/2012)  . Arthritis     "back; fingers" (01/06/2012)  . Chronic lower back pain   . Fatty liver disease, nonalcoholic   . CHB (complete heart block)   . Presence of permanent cardiac pacemaker 9/292013    St.Jude  . Atrial flutter     s/p EPS +RF ablation of typical atrial flutter April 2015      Allergies  Allergen Reactions  . Nitroglycerin Hives, Swelling and Rash     Current Outpatient Prescriptions  Medication Sig Dispense Refill  . albuterol (PROVENTIL HFA;VENTOLIN HFA) 108 (90 BASE) MCG/ACT inhaler Inhale 2 puffs into the lungs every 6 (six) hours as needed for wheezing.    Marland Kitchen buPROPion (WELLBUTRIN SR) 150 MG 12 hr tablet Take 150 mg by mouth 2 (two) times daily.    . Canagliflozin (INVOKANA) 300 MG TABS Take 300 mg by mouth daily.     . diazepam (VALIUM) 10 MG tablet Take 10 mg by mouth every 6 (six) hours as needed for anxiety.    Marland Kitchen diltiazem (CARDIZEM CD) 360 MG 24 hr capsule Take 1 capsule (360 mg total) by mouth every morning. 90 capsule 6  . flecainide (TAMBOCOR) 100 MG tablet TAKE 1 TABLET BY MOUTH TWICE A DAY 90 tablet 1  . flecainide (TAMBOCOR) 100 MG tablet TAKE 1 TABLET BY MOUTH TWICE A DAY (Patient not taking: Reported on 05/13/2014) 90 tablet 3  . furosemide (LASIX) 20 MG tablet Take 2 tablets (40 mg total) by mouth every morning. 60 tablet 3  . HYDROcodone-acetaminophen (NORCO) 10-325 MG per tablet Take 1 tablet by mouth every 6 (six)  hours as needed for pain.    Marland Kitchen insulin aspart (NOVOLOG) 100 UNIT/ML injection Inject 10-15 Units into the skin 3 (three) times daily before meals.    . insulin glargine (LANTUS) 100 units/mL SOLN Inject 70 Units into the skin daily.     Marland Kitchen levothyroxine (SYNTHROID, LEVOTHROID) 75 MCG tablet Take 75 mcg by mouth every morning.  2  . metFORMIN (GLUCOPHAGE) 1000 MG tablet Take 1,000 mg by mouth 2 (two) times daily with a meal.    . Multiple Vitamin (MULTIVITAMIN) tablet Take 1 tablet by mouth daily.    . nadolol (CORGARD) 40 MG tablet Take 1 tablet (40 mg total) by mouth daily. 90 tablet 3  . ondansetron (ZOFRAN) 4 MG tablet Take 1 tablet (4 mg total) by mouth every 8 (eight) hours as needed for nausea or vomiting. 40 tablet 1  . pantoprazole (PROTONIX) 40 MG tablet Take 40 mg by mouth every morning.    . polyethylene  glycol-electrolytes (NULYTELY/GOLYTELY) 420 G solution Take 4,000 mLs by mouth once. 4000 mL 0  . rivaroxaban (XARELTO) 20 MG TABS tablet Take 1 tablet (20 mg total) by mouth daily with supper. 30 tablet 0  . simvastatin (ZOCOR) 20 MG tablet Take 20 mg by mouth every evening.    Marland Kitchen spironolactone (ALDACTONE) 50 MG tablet Take 1 tablet (50 mg total) by mouth 2 (two) times daily. 60 tablet 5  . Umeclidinium-Vilanterol (ANORO ELLIPTA) 62.5-25 MCG/INH AEPB 1 puff once daily 3 each 6   No current facility-administered medications for this visit.     Past Surgical History  Procedure Laterality Date  . Back surgery    . Spinal cord stimulator implant  2006  . Cholecystectomy  1993  . Nasal septum surgery  1992  . Tonsillectomy and adenoidectomy  1992  . Posterior fusion lumbar spine  1999    L4-5  . Lumbar disc surgery  1994; ~ 1995; ~ 1996  . Warthin's tumor excision  1990's    right  . Permanent pacemaker insertion  01/08/2012    CHB  . US echocardiography  12/28/2011    mild LVH,mild mitral annulara ca+,mild MR  . Nuclear stress test  10/19/2004    No ischemia  . Esophagogastroduodenoscopy  11/08/2004    XBD:ZHGDJM esophageal erosions consistent with erosive reflux esophagitis/Areas of hemorrhage and nodularity of the fundal mucosa of uncertain significance, biopsied.  Small hiatal hernia, otherwise normal stomach  . Colonoscopy  11/08/2004    EQA:STMHDQ rectum, colon, TI.  Marland Kitchen Esophagogastroduodenoscopy  2010    Dr. Gala Romney: 3 columns Grade 1 varices, erosive esophagitis, HH, portal gastropathy, normal D1, D2  . Esophagogastroduodenoscopy (egd) with esophageal dilation N/A 02/14/2013    QIW:LNLGX 1 esophageal varices. Abnormal distal esophagus/status post biopsy after Maloney dilation. Portal gastropathy. Antral erosions-status post biopsy. path negative for H.pylori, benign path.  . Permanent pacemaker insertion N/A 01/09/2012    Procedure: PERMANENT PACEMAKER INSERTION;  Surgeon: Sanda Klein, MD;  Location: [facility] CATH LAB;  Service: Cardiovascular;  Laterality: N/A;  . Atrial flutter ablation N/A 07/10/2013    Procedure: ATRIAL FLUTTER ABLATION;  Surgeon: Evans Lance, MD;  Location: [facility] CATH LAB;  Service: Cardiovascular;  Laterality: N/A;     Allergies  Allergen Reactions  . Nitroglycerin Hives, Swelling and Rash      Family History  Problem Relation Age of Onset  . Stroke Brother   . Cancer Mother   . Arrhythmia Father      Social History Mr. Thorstenson reports  that he quit smoking about 14 months ago. His smoking use included Cigarettes. He has a 135 pack-year smoking history. He quit smokeless tobacco use about 18 months ago. Mr. Band reports that he does not drink alcohol.   Review of Systems CONSTITUTIONAL: No weight loss, fever, chills, weakness or fatigue.  HEENT: Eyes: No visual loss, blurred vision, double vision or yellow sclerae.No hearing loss, sneezing, congestion, runny nose or sore throat.  SKIN: No rash or itching.  CARDIOVASCULAR: per  HPI RESPIRATORY: No cough or sputum.  GASTROINTESTINAL: No anorexia, nausea, vomiting or diarrhea. No abdominal pain or blood.  GENITOURINARY: No burning on urination, no polyuria NEUROLOGICAL: No headache, dizziness, syncope, paralysis, ataxia, numbness or tingling in the extremities. No change in bowel or bladder control.  MUSCULOSKELETAL: No muscle, back pain, joint pain or stiffness.  LYMPHATICS: No enlarged nodes. No history of splenectomy.  PSYCHIATRIC: No history of depression or anxiety.  ENDOCRINOLOGIC: No reports of sweating, cold or heat intolerance. No polyuria or polydipsia.  Marland Kitchen   Physical Examination p 68 bp 128/72 Wt 257 lbs BMI 37 Gen: resting comfortably, no acute distress HEENT: no scleral icterus, pupils equal round and reactive, no palptable cervical adenopathy,  CV: RRR, no m/r/g, no JVD Resp: Clear to auscultation bilaterally GI: abdomen is soft, non-tender, non-distended, normal  bowel sounds, no hepatosplenomegaly MSK: extremities are warm, no edema.  Skin: warm, no rash Neuro:  no focal deficits Psych: appropriate affect   Diagnostic Studies Jan 2014 Myoview: no ischemia   11/2012 Echo: LVEF 60-65%, mild LVH, moderate basal septal hypertrophy, mild LAE     Assessment and Plan  1. Afib/Aflutter - no current symptoms since recent aflutter ablation, pacemaker check with no atrial arrhythmias - defer continued anticoag and flecanide to Dr Lovena Le - will decrease dilt to 240mg  daily due to dizziness with standing.   2. HTN:  - at goal,continue current meds   3. HL  - followed by PCP. Reports PCP is watching his LFTs closely due to an increase and history of NASH, will defer management to PCP   4. Sinus arrest  - normal pacemaker check on last visit, continue regular checks  5. COPD - per pulmonary      Arnoldo Lenis, [name]

## 2014-05-27 ENCOUNTER — Ambulatory Visit (INDEPENDENT_AMBULATORY_CARE_PROVIDER_SITE_OTHER): Payer: Medicare HMO | Admitting: *Deleted

## 2014-05-27 DIAGNOSIS — I442 Atrioventricular block, complete: Secondary | ICD-10-CM

## 2014-05-27 LAB — MDC_IDC_ENUM_SESS_TYPE_REMOTE
Battery Remaining Longevity: 80 mo
Battery Remaining Percentage: 67 %
Battery Voltage: 2.93 V
Brady Statistic AP VP Percent: 1 %
Brady Statistic AS VS Percent: 12 %
Implantable Pulse Generator Model: 2210
Implantable Pulse Generator Serial Number: 7393982
Lead Channel Impedance Value: 410 Ohm
Lead Channel Pacing Threshold Amplitude: 0.75 V
Lead Channel Pacing Threshold Pulse Width: 0.4 ms
Lead Channel Pacing Threshold Pulse Width: 0.4 ms
Lead Channel Sensing Intrinsic Amplitude: 2.3 mV
Lead Channel Sensing Intrinsic Amplitude: 4.5 mV
Lead Channel Setting Pacing Amplitude: 2 V
Lead Channel Setting Pacing Amplitude: 2.5 V
Lead Channel Setting Pacing Pulse Width: 0.4 ms
Lead Channel Setting Sensing Sensitivity: 2 mV
MDC IDC MSMT LEADCHNL RA PACING THRESHOLD AMPLITUDE: 0.75 V
MDC IDC MSMT LEADCHNL RV IMPEDANCE VALUE: 530 Ohm
MDC IDC SESS DTM: 20160216081642
MDC IDC STAT BRADY AP VS PERCENT: 88 %
MDC IDC STAT BRADY AS VP PERCENT: 1 %
MDC IDC STAT BRADY RA PERCENT PACED: 88 %
MDC IDC STAT BRADY RV PERCENT PACED: 1 %

## 2014-05-27 NOTE — Progress Notes (Signed)
ON RECALL FOR 6 MONTH ULTRASOUND °

## 2014-05-27 NOTE — Progress Notes (Signed)
Remote pacemaker transmission.   

## 2014-05-28 ENCOUNTER — Encounter (HOSPITAL_COMMUNITY)
Admission: RE | Disposition: A | Discharge status: Home / Self Care | Payer: Self-pay | Source: Ambulatory Visit | Attending: Internal Medicine

## 2014-05-28 ENCOUNTER — Ambulatory Visit (HOSPITAL_COMMUNITY)
Admission: RE | Admit: 2014-05-28 | Discharge: 2014-05-29 | Disposition: A | Discharge status: Home / Self Care | Payer: Medicare HMO | Source: Ambulatory Visit | Attending: Internal Medicine | Admitting: Internal Medicine

## 2014-05-28 ENCOUNTER — Other Ambulatory Visit: Payer: Self-pay | Admitting: Internal Medicine

## 2014-05-28 ENCOUNTER — Encounter (HOSPITAL_COMMUNITY): Payer: Self-pay

## 2014-05-28 ENCOUNTER — Other Ambulatory Visit: Payer: Self-pay

## 2014-05-28 DIAGNOSIS — R1314 Dysphagia, pharyngoesophageal phase: Secondary | ICD-10-CM

## 2014-05-28 DIAGNOSIS — K2210 Ulcer of esophagus without bleeding: Secondary | ICD-10-CM | POA: Insufficient documentation

## 2014-05-28 DIAGNOSIS — Z86010 Personal history of colonic polyps: Secondary | ICD-10-CM | POA: Insufficient documentation

## 2014-05-28 DIAGNOSIS — Z1211 Encounter for screening for malignant neoplasm of colon: Secondary | ICD-10-CM

## 2014-05-28 DIAGNOSIS — I8500 Esophageal varices without bleeding: Secondary | ICD-10-CM

## 2014-05-28 DIAGNOSIS — R1013 Epigastric pain: Secondary | ICD-10-CM

## 2014-05-28 DIAGNOSIS — D124 Benign neoplasm of descending colon: Secondary | ICD-10-CM

## 2014-05-28 DIAGNOSIS — K210 Gastro-esophageal reflux disease with esophagitis: Secondary | ICD-10-CM

## 2014-05-28 DIAGNOSIS — K449 Diaphragmatic hernia without obstruction or gangrene: Secondary | ICD-10-CM

## 2014-05-28 DIAGNOSIS — R1310 Dysphagia, unspecified: Secondary | ICD-10-CM

## 2014-05-28 HISTORY — PX: COLONOSCOPY: SHX5424

## 2014-05-28 HISTORY — PX: ESOPHAGOGASTRODUODENOSCOPY: SHX5428

## 2014-05-28 HISTORY — PX: ESOPHAGEAL DILATION: SHX303

## 2014-05-28 LAB — GLUCOSE, CAPILLARY: Glucose-Capillary: 138 mg/dL — ABNORMAL HIGH (ref 70–99)

## 2014-05-28 SURGERY — COLONOSCOPY
Anesthesia: Moderate Sedation

## 2014-05-28 MED ORDER — ONDANSETRON HCL 4 MG/2ML IJ SOLN
INTRAMUSCULAR | Status: DC | PRN
  Administered 2014-05-28: 4 mg via INTRAVENOUS

## 2014-05-28 MED ORDER — SODIUM CHLORIDE 0.9 % IV SOLN
INTRAVENOUS | Status: DC
  Administered 2014-05-28: 10:00:00 via INTRAVENOUS

## 2014-05-28 MED ORDER — MIDAZOLAM HCL 5 MG/5ML IJ SOLN
INTRAMUSCULAR | Status: DC | PRN
  Administered 2014-05-28: 1 mg via INTRAVENOUS
  Administered 2014-05-28: 2 mg via INTRAVENOUS
  Administered 2014-05-28 (×3): 1 mg via INTRAVENOUS

## 2014-05-28 MED ORDER — LIDOCAINE VISCOUS 2 % MT SOLN
OROMUCOSAL | Status: DC | PRN
  Administered 2014-05-28: 3 mL via OROMUCOSAL

## 2014-05-28 MED ORDER — STERILE WATER FOR IRRIGATION IR SOLN
Status: DC | PRN
  Administered 2014-05-28: 11:00:00

## 2014-05-28 MED ORDER — MEPERIDINE HCL 100 MG/ML IJ SOLN
INTRAMUSCULAR | Status: DC | PRN
  Administered 2014-05-28: 50 mg via INTRAVENOUS
  Administered 2014-05-28: 25 mg via INTRAVENOUS

## 2014-05-28 MED ORDER — ONDANSETRON HCL 4 MG/2ML IJ SOLN
INTRAMUSCULAR | Status: AC
  Filled 2014-05-28: qty 2

## 2014-05-28 MED ORDER — LIDOCAINE VISCOUS 2 % MT SOLN
OROMUCOSAL | Status: AC
  Filled 2014-05-28: qty 15

## 2014-05-28 MED ORDER — MEPERIDINE HCL 100 MG/ML IJ SOLN
INTRAMUSCULAR | Status: AC
  Filled 2014-05-28: qty 2

## 2014-05-28 MED ORDER — MIDAZOLAM HCL 5 MG/5ML IJ SOLN
INTRAMUSCULAR | Status: AC
  Filled 2014-05-28: qty 10

## 2014-05-28 NOTE — Interval H&P Note (Signed)
History and Physical Interval Note:  05/28/2014 10:54 AM  Ralph [name]  has presented today for surgery, with the diagnosis of screening colonscopy, dysphagia, dyspepsia  The various methods of treatment have been discussed with the patient and family. After consideration of risks, benefits and other options for treatment, the patient has consented to  Procedure(s) with comments: COLONOSCOPY (N/A) - 1045am ESOPHAGOGASTRODUODENOSCOPY (EGD) (N/A) ESOPHAGEAL DILATION (N/A) as a surgical intervention .  The patient's history has been reviewed, patient examined, no change in status, stable for surgery.  I have reviewed the patient's chart and labs.  Questions were answered to the patient's satisfaction.     Ralph [name]  No change. EGD with possible esophageal dilation and screening colonoscopy per plan.

## 2014-05-28 NOTE — H&P (View-Only) (Signed)
Quick Note:  No HCC identified. Repeat in 6 months. ______

## 2014-05-28 NOTE — Op Note (Signed)
[facility] [address] [place], [address]   COLONOSCOPY PROCEDURE REPORT  PATIENT: [name], [name]  MR#: [id number] BIRTHDATE: Feb 16, 1951 , 63  yrs. old GENDER: male ENDOSCOPIST: R.  Garfield Cornea, MD FACP [facility] REFERRED TK:[id number] Orson Ape, [name] PROCEDURE DATE:  2014/06/13 PROCEDURE:   Colonoscopy with snare polypectomy INDICATIONS:Average risk colorectal cancer screening examination. MEDICATIONS: Versed 6 mg IV and Demerol 75 mg IV in divided doses. Zofran 4 mg IV. ASA CLASS:       Class III  CONSENT: The risks, benefits, alternatives and imponderables including but not limited to bleeding, perforation as well as the possibility of a missed lesion have been reviewed.  The potential for biopsy, lesion removal, etc. have also been discussed. Questions have been answered.  All parties agreeable.  Please see the history and physical in the medical record for more information.  DESCRIPTION OF PROCEDURE:   After the risks benefits and alternatives of the procedure were thoroughly explained, informed consent was obtained.  The digital rectal exam revealed no abnormalities of the rectum.   The [id number] ([id number])  endoscope was introduced through the anus and advanced to the cecum, which was identified by both the appendix and ileocecal valve. No adverse events experienced.   The quality of the prep was adequate  The instrument was then slowly withdrawn as the colon was fully examined.      COLON FINDINGS: Redundant colon.  External abdominal pressure and changing the patient's position required to reach the cecum.  The rectal mucosa appeared normal.  The patient had (1) 6 mm polyp in the mid descending segment; otherwise, the remainder of the colonic mucosa appeared normal.  The above-mentioned problems cold snare removed and recovered for the pathologist.  Retroflexion was performed. .  Withdrawal time=7 minutes 0 seconds.  The scope was withdrawn  and the procedure completed. COMPLICATIONS: There were no immediate complications.  ENDOSCOPIC IMPRESSION: Redundant colon. Single colonic polyp?"removed as described above.  RECOMMENDATIONS: Follow-up on pathology. Resumes Xarelto tomorrow. Proceed with a modified barium swallow/speech evaluation to evaluate dysphagia symptoms further. See EGD report.  eSigned:  R. Garfield Cornea, MD Rosalita Chessman [facility] Jun 13, 2014 11:54 AM   cc:  CPT CODES: ICD CODES:  The ICD and CPT codes recommended by this software are interpretations from the data that the clinical staff has captured with the software.  The verification of the translation of this report to the ICD and CPT codes and modifiers is the sole responsibility of the health care institution and practicing physician where this report was generated.  [employer] will not be held responsible for the validity of the ICD and CPT codes included on this report.  AMA assumes no liability for data contained or not contained herein. CPT is a Designer, television/film set of the [employer].  PATIENT NAME:  Ralph [name], Ralph [name] MR#: [id number]

## 2014-05-28 NOTE — Discharge Instructions (Addendum)
Continue Protonix 40 mg daily  Polyp information provided  Further recommendations to follow pending review of pathology report  Schedule modified barium swallow/speech therapy swallowing evaluation to further evaluate dysphagia symptoms.  Resume Xarelto tomorrow      Colonoscopy Discharge Instructions  Read the instructions outlined below and refer to this sheet in the next few weeks. These discharge instructions provide you with general information on caring for yourself after you leave the hospital. Your doctor may also give you specific instructions. While your treatment has been planned according to the most current medical practices available, unavoidable complications occasionally occur. If you have any problems or questions after discharge, call Dr. Gala Romney at [phone]. ACTIVITY  You may resume your regular activity, but move at a slower pace for the next 24 hours.   Take frequent rest periods for the next 24 hours.   Walking will help get rid of the air and reduce the bloated feeling in your belly (abdomen).   No driving for 24 hours (because of the medicine (anesthesia) used during the test).    Do not sign any important legal documents or operate any machinery for 24 hours (because of the anesthesia used during the test).  NUTRITION  Drink plenty of fluids.   You may resume your normal diet as instructed by your doctor.   Begin with a light meal and progress to your normal diet. Heavy or fried foods are harder to digest and may make you feel sick to your stomach (nauseated).   Avoid alcoholic beverages for 24 hours or as instructed.  MEDICATIONS  You may resume your normal medications unless your doctor tells you otherwise.  WHAT YOU CAN EXPECT TODAY  Some feelings of bloating in the abdomen.   Passage of more gas than usual.   Spotting of blood in your stool or on the toilet paper.  IF YOU HAD POLYPS REMOVED DURING THE COLONOSCOPY:  No aspirin products  for 7 days or as instructed.   No alcohol for 7 days or as instructed.   Eat a soft diet for the next 24 hours.  FINDING OUT THE RESULTS OF YOUR TEST Not all test results are available during your visit. If your test results are not back during the visit, make an appointment with your caregiver to find out the results. Do not assume everything is normal if you have not heard from your caregiver or the medical facility. It is important for you to follow up on all of your test results.  SEEK IMMEDIATE MEDICAL ATTENTION IF:  You have more than a spotting of blood in your stool.   Your belly is swollen (abdominal distention).   You are nauseated or vomiting.   You have a temperature over 101.  You have abdominal pain or discomfort that is severe or gets worse throughout the day. EGD Discharge instructions Please read the instructions outlined below and refer to this sheet in the next few weeks. These discharge instructions provide you with general information on caring for yourself after you leave the hospital. Your doctor may also give you specific instructions. While your treatment has been planned according to the most current medical practices available, unavoidable complications occasionally occur. If you have any problems or questions after discharge, please call your doctor. ACTIVITY You may resume your regular activity but move at a slower pace for the next 24 hours.  Take frequent rest periods for the next 24 hours.  Walking will help expel (get rid of) the air  and reduce the bloated feeling in your abdomen.  No driving for 24 hours (because of the anesthesia (medicine) used during the test).  You may shower.  Do not sign any important legal documents or operate any machinery for 24 hours (because of the anesthesia used during the test).  NUTRITION Drink plenty of fluids.  You may resume your normal diet.  Begin with a light meal and progress to your normal diet.  Avoid alcoholic  beverages for 24 hours or as instructed by your caregiver.  MEDICATIONS You may resume your normal medications unless your caregiver tells you otherwise.  WHAT YOU CAN EXPECT TODAY You may experience abdominal discomfort such as a feeling of fullness or gas pains.  FOLLOW-UP Your doctor will discuss the results of your test with you.  SEEK IMMEDIATE MEDICAL ATTENTION IF ANY OF THE FOLLOWING OCCUR: Excessive nausea (feeling sick to your stomach) and/or vomiting.  Severe abdominal pain and distention (swelling).  Trouble swallowing.  Temperature over 101 F (37.8 C).  Rectal bleeding or vomiting of blood.     Colon Polyps Polyps are lumps of extra tissue growing inside the body. Polyps can grow in the large intestine (colon). Most colon polyps are noncancerous (benign). However, some colon polyps can become cancerous over time. Polyps that are larger than a pea may be harmful. To be safe, caregivers remove and test all polyps. CAUSES  Polyps form when mutations in the genes cause your cells to grow and divide even though no more tissue is needed. RISK FACTORS There are a number of risk factors that can increase your chances of getting colon polyps. They include:  Being older than 50 years.  Family history of colon polyps or colon cancer.  Long-term colon diseases, such as colitis or Crohn disease.  Being overweight.  Smoking.  Being inactive.  Drinking too much alcohol. SYMPTOMS  Most small polyps do not cause symptoms. If symptoms are present, they may include:  Blood in the stool. The stool may look dark red or black.  Constipation or diarrhea that lasts longer than 1 week. DIAGNOSIS People often do not know they have polyps until their caregiver finds them during a regular checkup. Your caregiver can use 4 tests to check for polyps:  Digital rectal exam. The caregiver wears gloves and feels inside the rectum. This test would find polyps only in the rectum.  Barium  enema. The caregiver puts a liquid called barium into your rectum before taking X-rays of your colon. Barium makes your colon look white. Polyps are dark, so they are easy to see in the X-ray pictures.  Sigmoidoscopy. A thin, flexible tube (sigmoidoscope) is placed into your rectum. The sigmoidoscope has a light and tiny camera in it. The caregiver uses the sigmoidoscope to look at the last third of your colon.  Colonoscopy. This test is like sigmoidoscopy, but the caregiver looks at the entire colon. This is the most common method for finding and removing polyps. TREATMENT  Any polyps will be removed during a sigmoidoscopy or colonoscopy. The polyps are then tested for cancer. PREVENTION  To help lower your risk of getting more colon polyps:  Eat plenty of fruits and vegetables. Avoid eating fatty foods.  Do not smoke.  Avoid drinking alcohol.  Exercise every day.  Lose weight if recommended by your caregiver.  Eat plenty of calcium and folate. Foods that are rich in calcium include milk, cheese, and broccoli. Foods that are rich in folate include chickpeas, kidney beans, and  spinach. HOME CARE INSTRUCTIONS Keep all follow-up appointments as directed by your caregiver. You may need periodic exams to check for polyps. SEEK MEDICAL CARE IF: You notice bleeding during a bowel movement. Document Released: 12/23/2003 Document Revised: 06/20/2011 Document Reviewed: 06/07/2011 [facility] Patient Information 2015 [place], [place]. This information is not intended to replace advice given to you by your health care provider. Make sure you discuss any questions you have with your health care provider.    Confirmed with patient and wife that Ipad was received

## 2014-05-28 NOTE — Op Note (Signed)
[facility] [address] [place], [address]   ENDOSCOPY PROCEDURE REPORT  PATIENT: Ralph [name], Ralph [name]  MR#: [id number] BIRTHDATE: 1950-12-12 , 63  yrs. old GENDER: male ENDOSCOPIST: R.  Garfield Cornea, MD FACP FACG REFERRED BY:  Elsie Lincoln, [name] PROCEDURE DATE:  06-18-2014 PROCEDURE:  EGD, diagnostic INDICATIONS:  vague esophageal dysphagia. MEDICATIONS: Versed 4 mg IV and Demerol 75 mg IV in divided doses. Zofran 4 mg IV.  Xylocaine gel orally. ASA CLASS:      Class II  CONSENT: The risks, benefits, limitations, alternatives and imponderables have been discussed.  The potential for biopsy, esophogeal dilation, etc. have also been reviewed.  Questions have been answered.  All parties agreeable.  Please see the history and physical in the medical record for more information.  DESCRIPTION OF PROCEDURE: After the risks benefits and alternatives of the procedure were thoroughly explained, informed consent was obtained.  The [id number] ([id number]) endoscope was introduced through the mouth and advanced to the second portion of the duodenum , limited by Without limitations. The instrument was slowly withdrawn as the mucosa was fully examined.    Undulating Z line with tiny distal esophageal erosions.  2 short columns of grade 1 esophageal varices.  Tubular esophagus patent throughout its course.   Stomach empty.  Small hiatal hernia. Gastric mucosa appeared normal .  Patent pylorus.  Normal-appearing first and second portion of the duodenum  Esophageal dilation not performed.  Retroflexed views revealed as previously described.     The scope was then withdrawn from the patient and the procedure completed.  COMPLICATIONS: There were no immediate complications.  ENDOSCOPIC IMPRESSION: Mild erosive reflux esophagitis. Grade 1 esophageal varices.  Patent esophagus.?"No dilation performed. Hiatal hernia.  RECOMMENDATIONS: Continue  Protonix 40 mg daily. Given  symptoms of transfer or oro-pharyngeal dysphagia. Will pursue a modified barium swallow.  See colonoscopy report.  REPEAT EXAM:  eSigned:  R. Garfield Cornea, MD Rosalita Chessman [facility] 06/18/2014 11:21 AM    CC:  CPT CODES: ICD CODES:  The ICD and CPT codes recommended by this software are interpretations from the data that the clinical staff has captured with the software.  The verification of the translation of this report to the ICD and CPT codes and modifiers is the sole responsibility of the health care institution and practicing physician where this report was generated.  [employer] will not be held responsible for the validity of the ICD and CPT codes included on this report.  AMA assumes no liability for data contained or not contained herein. CPT is a Designer, television/film set of the [employer].

## 2014-05-29 ENCOUNTER — Ambulatory Visit (HOSPITAL_COMMUNITY)
Admission: RE | Admit: 2014-05-29 | Discharge: 2014-05-29 | Disposition: A | Discharge status: Home / Self Care | Payer: Medicare HMO | Source: Ambulatory Visit | Attending: Internal Medicine | Admitting: Internal Medicine

## 2014-05-29 ENCOUNTER — Encounter (HOSPITAL_COMMUNITY): Payer: Self-pay | Admitting: Internal Medicine

## 2014-05-29 ENCOUNTER — Encounter: Payer: Self-pay | Admitting: Internal Medicine

## 2014-05-29 ENCOUNTER — Ambulatory Visit (HOSPITAL_COMMUNITY): Payer: Medicare HMO | Attending: Internal Medicine | Admitting: Speech Pathology

## 2014-05-29 DIAGNOSIS — R1314 Dysphagia, pharyngoesophageal phase: Secondary | ICD-10-CM | POA: Insufficient documentation

## 2014-05-29 DIAGNOSIS — K449 Diaphragmatic hernia without obstruction or gangrene: Secondary | ICD-10-CM | POA: Insufficient documentation

## 2014-05-29 DIAGNOSIS — K210 Gastro-esophageal reflux disease with esophagitis: Secondary | ICD-10-CM | POA: Insufficient documentation

## 2014-05-29 DIAGNOSIS — D124 Benign neoplasm of descending colon: Secondary | ICD-10-CM | POA: Insufficient documentation

## 2014-05-29 DIAGNOSIS — I8500 Esophageal varices without bleeding: Secondary | ICD-10-CM | POA: Insufficient documentation

## 2014-05-29 DIAGNOSIS — Z1211 Encounter for screening for malignant neoplasm of colon: Secondary | ICD-10-CM | POA: Diagnosis not present

## 2014-05-29 DIAGNOSIS — R1310 Dysphagia, unspecified: Secondary | ICD-10-CM

## 2014-05-29 NOTE — Therapy (Signed)
[place], [place], [address] Phone: [phone]   Fax:  [phone]  Modified Barium Swallow  Patient Details  Name: Ralph [name] MRN: [id number] Date of Birth: 09/19/1950 Referring Provider:  Daneil Dolin, MD  Encounter Date: 05/29/2014      End of Session - 05/29/14 1814    Visit Number 1   Number of Visits 1   Authorization Type A[employer]na Medicare   SLP Start Time 4650   SLP Stop Time  1342   SLP Time Calculation (min) 35 min   Activity Tolerance Patient tolerated treatment well      Past Medical History  Diagnosis Date  . Hypertension   . Cirrhosis     NASH-Hep A and B immune  . Depressed   . Varicose vein     of esophagus  . Difficult intubation     "trouble waking up afterwards" (01/06/2012)  . CHF (congestive heart failure) 01/06/2012  . Sinus pause 01/06/2012    5.2 seconds  . Anginal pain   . Sleep apnea     "don't wear mask" (01/06/2012)  . Emphysema   . Type II diabetes mellitus   . GERD (gastroesophageal reflux disease)   . H/O hiatal hernia   . Coughing up blood     "comes from my throat" (01/06/2012)  . Arthritis     "back; fingers" (01/06/2012)  . Chronic lower back pain   . Fatty liver disease, nonalcoholic   . CHB (complete heart block)   . Presence of permanent cardiac pacemaker 9/292013    [employer]  . Atrial flutter     s/p EPS +RF ablation of typical atrial flutter April 2015    Past Surgical History  Procedure Laterality Date  . Back surgery    . Spinal cord stimulator implant  2006  . Cholecystectomy  1993  . Nasal septum surgery  1992  . Tonsillectomy and adenoidectomy  1992  . Posterior fusion lumbar spine  1999    L4-5  . Lumbar disc surgery  1994; ~ 1995; ~ 1996  . Warthin's tumor excision  1990's    right  . Permanent pacemaker insertion  01/08/2012    CHB  . US echocardiography  12/28/2011    mild LVH,mild mitral annulara ca+,mild MR  . Nuclear stress test   10/19/2004    No ischemia  . Esophagogastroduodenoscopy  11/08/2004    PTW:SFKCLE esophageal erosions consistent with erosive reflux esophagitis/Areas of hemorrhage and nodularity of the fundal mucosa of uncertain significance, biopsied.  Small hiatal hernia, otherwise normal stomach  . Colonoscopy  11/08/2004    XNT:ZGYFVC rectum, colon, TI.  Marland Kitchen Esophagogastroduodenoscopy  2010    Dr. Gala Romney: 3 columns Grade 1 varices, erosive esophagitis, HH, portal gastropathy, normal D1, D2  . Esophagogastroduodenoscopy (egd) with esophageal dilation N/A 02/14/2013    BSW:HQPRF 1 esophageal varices. Abnormal distal esophagus/status post biopsy after Maloney dilation. Portal gastropathy. Antral erosions-status post biopsy. path negative for H.pylori, benign path.  . Permanent pacemaker insertion N/A 01/09/2012    Procedure: PERMANENT PACEMAKER INSERTION;  Surgeon: Sanda Klein, MD;  Location: [facility] CATH LAB;  Service: Cardiovascular;  Laterality: N/A;  . Atrial flutter ablation N/A 07/10/2013    Procedure: ATRIAL FLUTTER ABLATION;  Surgeon: Evans Lance, MD;  Location: [facility] CATH LAB;  Service: Cardiovascular;  Laterality: N/A;  . Colonoscopy N/A 05/28/2014    Procedure: COLONOSCOPY;  Surgeon: Daneil Dolin, MD;  [facility]: [facility];  Service: Endoscopy;  Laterality: N/A;  1045am  . Esophagogastroduodenoscopy N/A 05/28/2014    Procedure: ESOPHAGOGASTRODUODENOSCOPY (EGD);  Surgeon: Daneil Dolin, MD;  Location: [facility];  Service: Endoscopy;  Laterality: N/A;  . Esophageal dilation N/A 05/28/2014    Procedure: ESOPHAGEAL DILATION;  Surgeon: Daneil Dolin, MD;  Location: [facility];  Service: Endoscopy;  Laterality: N/A;    There were no vitals taken for this visit.  Visit Diagnosis: Dysphagia, pharyngoesophageal phase      Subjective Assessment - 05/29/14 1803    Symptoms "I get choked sometimes when I am eating and drinking."   Special Tests MBSS   Currently in Pain? No/denies              General - 05/29/14 1804    General Information   Date of Onset 05/28/14   HPI [name] is a 64 year old male with history of NASH cirrhosis presenting with recurrent solid food dysphagia and vague dyspepsia, with last EGD in Nov 2014 and improvement after dilation. He was referred for MBSS by Dr. Gala Romney to assess for possible oropharyngeal involvement. He remains on nadolol for variceal prophylaxis. EGD planned for repeat dilation and assessment of dyspepsia; query web, ring, stricture as culprit for dysphagia but unable to exclude esophageal dysmotility. Dyspepsia could be secondary to gastritis, PUD, NUD. Gallbladder absent. Early satiety. Last EGD in Nov 2014. Grade 1 esophageal varices. Abnormal distal esophagus/status post biopsy after Maloney dilation. Portal gastropathy. Antral erosions-status post biopsy. path negative for H.pylori, benign path.Notes improvement after that dilation. Pt stated that he had EGD yesterday, but I have not seen results from that. Pt tells SLP that he has been getting "choked" on things for the past several years (from his saliva, to water, to solid foods). He doesn't think that it has gotten worse, but is bothersome to him. No recent history of PNA.   Type of Study Modified Barium Swallowing Study   Reason for Referral Objectively evaluate swallowing function   Previous Swallow Assessment none on record, several GI studies   Diet Prior to this Study Regular;Thin liquids   Temperature Spikes Noted No   Respiratory Status Nasal cannula   History of Recent Intubation No   Behavior/Cognition Alert;Cooperative;Pleasant mood   Oral Cavity - Dentition Dentures, top;Adequate natural dentition   Oral Motor / Sensory Function Within functional limits   Self-Feeding Abilities Able to feed self   Patient Positioning Upright in chair   Baseline Vocal Quality Clear   Volitional Cough Strong   Volitional Swallow Able to elicit   Anatomy Within functional limits    Pharyngeal Secretions Not observed secondary MBS            Oral Preparation/Oral Phase - 05/29/14 1805    Oral Preparation/Oral Phase   Oral Phase [facility]   Electrical stimulation - Oral Phase   Was Electrical Stimulation Used No          Pharyngeal Phase - 05/29/14 1805    Pharyngeal Phase   Pharyngeal Phase Impaired   Pharyngeal - Thin   Pharyngeal - Thin Cup Pharyngeal residue - cp segment;Pharyngeal residue - pyriform sinuses   Pharyngeal - Thin Straw Pharyngeal residue - pyriform sinuses;Pharyngeal residue - cp segment   Pharyngeal - Solids   Pharyngeal - Puree Reduced pharyngeal peristalsis;Reduced epiglottic inversion;Reduced tongue base retraction;Pharyngeal residue - valleculae;Pharyngeal residue - pyriform sinuses;Pharyngeal residue - cp segment   Pharyngeal - Regular Reduced pharyngeal peristalsis;Reduced epiglottic inversion;Reduced tongue base retraction;Pharyngeal residue - valleculae;Pharyngeal  residue - pyriform sinuses;Pharyngeal residue - cp segment   Pharyngeal - Pill Within functional limits   Pharyngeal Phase - Comment   Pharyngeal Comment moderate vallecular and pyriform residue; residuals are greater in left pyriform   Electrical Stimulation - Pharyngeal Phase   Was Electrical Stimulation Used No          Cricopharyngeal Phase - 2014/06/10 1812    Cervical Esophageal Phase   Cervical Esophageal Phase Impaired   Cervical Esophageal Phase - Solids   Regular Reduced cricopharyngeal relaxation                  Plan - 2014/06/10 1815    Clinical Impression Statement Mr. Hallum presents with a moderate pharyngeal phase dysphagia characterized by decreased tongue base retraction and epiglottic deflection with overall decreased pharyngeal pressure and decreased timing with UES relaxation resulting in mild/moderate vallecular residue with puree and solids (min with liquids) and moderate pyriform residue with puree and solids. Repeat swallows reduced  pyriform residue by about half, head turn once already collected in pyriforms was ineffective. Liquid wash more beneficial in reducing pharyngeal residue. Pt was turned AP and bolus transit was bilateral, but residuals remained in LEFT pyriform. Pt was cued to implement head turn to the left (to close off the presumably weaker side) during swallow with solids and this was extremely effective in preventing pyriform residue. Pt denies history of stroke, cannot ascertain reason for greater pooling on left. Recommend trial period dysphagia therapy as an outpatient for pharyngeal strengthening and training for compensatory strategies. Pt agreeable to plan. Continue regular diet with thin liquids, head turn to the left when swallowing solids. Alternate solids with liquids.    Speech Therapy Frequency 1x /week   Duration 4 weeks   Treatment/Interventions Aspiration precaution training;Pharyngeal strengthening exercises;Diet toleration management by SLP;Compensatory techniques;SLP instruction and feedback;Patient/family education   Potential to Achieve Goals Fair   Potential Considerations Previous level of function   Consulted and Agree with Plan of Care Patient          G-Codes - 06-10-14 1816    Functional Assessment Tool Used MBSS; clinical judgement   Functional Limitations Swallowing   Swallow Current Status (G8676) At least 20 percent but less than 40 percent impaired, limited or restricted   Swallow Goal Status (P9509) At least 1 percent but less than 20 percent impaired, limited or restricted   Swallow Discharge Status [phone]) At least 20 percent but less than 40 percent impaired, limited or restricted          Recommendations/Treatment - 06-10-2014 1813    Swallow Evaluation Recommendations   Diet Recommendations Regular;Thin liquid   Liquid Administration via Cup;Straw   Medication Administration Whole meds with liquid   Supervision Patient able to self feed   Compensations Multiple dry  swallows after each bite/sip;Follow solids with liquid;Effortful swallow   Postural Changes and/or Swallow Maneuvers Seated upright 90 degrees;Upright 30-60 min after meal;Out of bed for meals;Head turn left during swallow  head turn left with solids during swallow   Oral Care Recommendations Oral care BID   Other Recommendations Clarify dietary restrictions   Follow up Recommendations Outpatient SLP          Prognosis - Jun 10, 2014 1814    Prognosis   Prognosis for Safe Diet Advancement Fair   Barriers to Reach Goals Time post onset   Individuals Consulted   Consulted and Agree with Results and Recommendations Patient   Report Sent to  Referring physician  Problem List Patient Active Problem List   Diagnosis Date Noted  . History of colonic polyps   . Encounter for screening colonoscopy 05/07/2014  . Psoriasis 10/15/2013  . Hypoxemia 09/05/2013  . SVT (supraventricular tachycardia) 06/06/2013  . COPD (chronic obstructive pulmonary disease) 06/06/2013  . Cardiac pacemaker in situ 06/06/2013  . Pacemaker 05/22/2013  . Cirrhosis 01/22/2013  . Dyspepsia 01/22/2013  . Anemia 01/22/2013  . Atrial fibrillation 11/21/2012  . Atrial flutter 11/21/2012  . Chest pain 11/20/2012  . COPD exacerbation 11/20/2012  .  Acute on chronic diastolic heart failure, predominantly Right heart failure 01/09/2012  . Syncope 01/06/2012  . Sinus arrest, 5 second pause on event monitor 01/06/2012  . Diabetes mellitus, type 2 IDDM 01/06/2012  . Obesity 01/06/2012  . Smoker 01/06/2012  . Obstructive sleep apnea 01/06/2012  . DJD, on disability secondary to back pain, surg X 3 01/06/2012  . ABDOMINAL PAIN, RIGHT UPPER QUADRANT 06/14/2009  . ABDOMINAL PAIN 05/14/2009  . Hepatic cirrhosis 01/13/2009  . GERD 12/04/2008  . GASTRITIS, ACUTE 12/04/2008  . HOARSENESS 12/04/2008  . CHEST PAIN 12/04/2008  . Dysphagia, pharyngoesophageal phase 12/04/2008   Thank you,  Genene Churn,  [place]  [facility] 05/29/2014, 6:26 PM  [place] [address] [place], [place], [address] Phone: [phone]   Fax:  [phone]

## 2014-05-30 ENCOUNTER — Other Ambulatory Visit: Payer: Self-pay

## 2014-05-31 LAB — LIPID PANEL
Cholesterol: 118 mg/dL (ref 0–200)
HDL: 40 mg/dL (ref >39)
LDL CALC: 45 mg/dL (ref 0–99)
Total CHOL/HDL Ratio: 3 Ratio
Triglycerides: 166 mg/dL — ABNORMAL HIGH (ref <150)
VLDL: 33 mg/dL (ref 0–40)

## 2014-06-01 MED ORDER — SPIRONOLACTONE 50 MG PO TABS: 50.0000 mg | ORAL_TABLET | Freq: Two times a day (BID) | ORAL | Status: DC

## 2014-06-03 ENCOUNTER — Telehealth: Payer: Self-pay

## 2014-06-03 MED ORDER — SPIRONOLACTONE 50 MG PO TABS: 50.0000 mg | ORAL_TABLET | Freq: Two times a day (BID) | ORAL | Status: DC

## 2014-06-03 NOTE — Telephone Encounter (Signed)
done

## 2014-06-03 NOTE — Telephone Encounter (Signed)
Pharmacy sent request for 90 day supply of Spironolactone 50 mg tablets.

## 2014-06-05 ENCOUNTER — Other Ambulatory Visit: Payer: Self-pay

## 2014-06-05 MED ORDER — RIVAROXABAN 20 MG PO TABS: 20.0000 mg | ORAL_TABLET | Freq: Every day | ORAL | Status: DC

## 2014-06-06 ENCOUNTER — Encounter: Payer: Self-pay | Admitting: *Deleted

## 2014-06-17 ENCOUNTER — Encounter: Payer: Self-pay | Admitting: Internal Medicine

## 2014-06-19 ENCOUNTER — Ambulatory Visit (HOSPITAL_COMMUNITY): Payer: Medicare HMO | Attending: Internal Medicine | Admitting: Speech Pathology

## 2014-06-19 DIAGNOSIS — R1314 Dysphagia, pharyngoesophageal phase: Secondary | ICD-10-CM | POA: Diagnosis present

## 2014-06-19 NOTE — Therapy (Signed)
[place], [place], [address] Phone: [phone]   Fax:  [phone]  Speech Language Pathology Treatment  Patient Details  Name: Ralph [name] MRN: [id number] Date of Birth: 1951/02/05 Referring Provider:  Daneil Dolin, MD  Encounter Date: 06/19/2014      End of Session - 06/19/14 1726    Visit Number 1   Number of Visits 4   Authorization Type Aetna Medicare   Authorization - Visit Number 1   Authorization - Number of Visits 4   SLP Start Time 9735   SLP Stop Time  1441   SLP Time Calculation (min) 49 min   Activity Tolerance Patient tolerated treatment well      Past Medical History  Diagnosis Date  . Hypertension   . Cirrhosis     NASH-Hep A and B immune  . Depressed   . Varicose vein     of esophagus  . Difficult intubation     "trouble waking up afterwards" (01/06/2012)  . CHF (congestive heart failure) 01/06/2012  . Sinus pause 01/06/2012    5.2 seconds  . Anginal pain   . Sleep apnea     "don't wear mask" (01/06/2012)  . Emphysema   . Type II diabetes mellitus   . GERD (gastroesophageal reflux disease)   . H/O hiatal hernia   . Coughing up blood     "comes from my throat" (01/06/2012)  . Arthritis     "back; fingers" (01/06/2012)  . Chronic lower back pain   . Fatty liver disease, nonalcoholic   . CHB (complete heart block)   . Presence of permanent cardiac pacemaker 9/292013    [employer]  . Atrial flutter     s/p EPS +RF ablation of typical atrial flutter April 2015    Past Surgical History  Procedure Laterality Date  . Back surgery    . Spinal cord stimulator implant  2006  . Cholecystectomy  1993  . Nasal septum surgery  1992  . Tonsillectomy and adenoidectomy  1992  . Posterior fusion lumbar spine  1999    L4-5  . Lumbar disc surgery  1994; ~ 1995; ~ 1996  . Warthin's tumor excision  1990's    right  . Permanent pacemaker insertion  01/08/2012    CHB  . US  echocardiography  12/28/2011    mild LVH,mild mitral annulara ca+,mild MR  . Nuclear stress test  10/19/2004    No ischemia  . Esophagogastroduodenoscopy  11/08/2004    HGD:JMEQAS esophageal erosions consistent with erosive reflux esophagitis/Areas of hemorrhage and nodularity of the fundal mucosa of uncertain significance, biopsied.  Small hiatal hernia, otherwise normal stomach  . Colonoscopy  11/08/2004    TMH:DQQIWL rectum, colon, TI.  Marland Kitchen Esophagogastroduodenoscopy  2010    Dr. Gala Romney: 3 columns Grade 1 varices, erosive esophagitis, HH, portal gastropathy, normal D1, D2  . Esophagogastroduodenoscopy (egd) with esophageal dilation N/A 02/14/2013    NLG:XQJJH 1 esophageal varices. Abnormal distal esophagus/status post biopsy after Maloney dilation. Portal gastropathy. Antral erosions-status post biopsy. path negative for H.pylori, benign path.  . Permanent pacemaker insertion N/A 01/09/2012    Procedure: PERMANENT PACEMAKER INSERTION;  Surgeon: Sanda Klein, MD;  Location: [facility] CATH LAB;  Service: Cardiovascular;  Laterality: N/A;  . Atrial flutter ablation N/A 07/10/2013    Procedure: ATRIAL FLUTTER ABLATION;  Surgeon: Evans Lance, MD;  Location: [facility] CATH LAB;  Service: Cardiovascular;  Laterality: N/A;  . Colonoscopy N/A 05/28/2014  Procedure: COLONOSCOPY;  Surgeon: Daneil Dolin, MD;  Location: [facility];  Service: Endoscopy;  Laterality: N/A;  1045am  . Esophagogastroduodenoscopy N/A 05/28/2014    Procedure: ESOPHAGOGASTRODUODENOSCOPY (EGD);  Surgeon: Daneil Dolin, MD;  Location: [facility];  Service: Endoscopy;  Laterality: N/A;  . Esophageal dilation N/A 05/28/2014    Procedure: ESOPHAGEAL DILATION;  Surgeon: Daneil Dolin, MD;  [facility]: [facility];  Service: Endoscopy;  Laterality: N/A;    There were no vitals filed for this visit.  Visit Diagnosis: Dysphagia, pharyngoesophageal phase - Plan: SLP plan of care cert/re-cert      Subjective Assessment - 06/19/14 1721     Symptoms "I turn my head to the left now when things won't go down."   Currently in Pain? No/denies               ADULT SLP TREATMENT - 06/19/14 1721    General Information   Behavior/Cognition Alert;Cooperative;Pleasant mood   Patient Positioning Upright in chair   Oral care provided N/A   HPI [name] was seen for MBSS on 05/29/2014 and noted to have significant vallecular and pyriform residuals with solids and puree post swallow. Dysphagia therapy was recommended.   Treatment Provided   Treatment provided Dysphagia   Dysphagia Treatment   Temperature Spikes Noted No   Respiratory Status Nasal cannula   Oral Cavity - Dentition Dentures, top   Treatment Methods Therapeutic exercise;Compensation strategy training;Patient/caregiver education   Patient observed directly with PO's No   Feeding Able to feed self   Type of cueing Verbal;Visual;Tactile   Amount of cueing Moderate   Other treatment/comments --  training of pharyngeal strengthening exercises   Pain Assessment   Pain Assessment No/denies pain   Assessment / Recommendations / Plan   Plan Continue with current plan of care   Dysphagia Recommendations   Diet recommendations Regular;Thin liquid   Liquids provided via Cup;Straw   Medication Administration Whole meds with liquid   Supervision Patient able to self feed   Compensations Multiple dry swallows after each bite/sip;Follow solids with liquid;Effortful swallow   Postural Changes and/or Swallow Maneuvers Out of bed for meals;Seated upright 90 degrees;Upright 30-60 min after meal;Head turn left during swallow   Progression Toward Goals   Progression toward goals Progressing toward goals          SLP Education - 06/19/14 1726    Education provided Yes   Education Details pharyngeal strengthening exercises   Person(s) Educated Patient   Methods Explanation;Demonstration;Handout;Verbal cues;Tactile cues   Comprehension Verbalized  understanding;Returned demonstration;Need further instruction          SLP Short Term Goals - 06/19/14 1731    SLP SHORT TERM GOAL #1   Title Pt will complete pharyngeal strengthening exercises with use of written cue.   Baseline Mod assist   Time 3   Period Weeks   Status New   SLP SHORT TERM GOAL #2   Title Pt will verbalize compensatory strategies to increase safe swallow function independently   Baseline mod cues   Time 3   Period Weeks   Status New            Plan - 06/19/14 1727    Clinical Impression Statement Swallow study was reviewed with [name] (moderate pharyngeal phase dysphagia characterized by decreased tongue base retraction and epiglottic deflection with overall decreased pharyngeal pressure and decreased timing with UES relaxation resulting in mild/moderate vallecular residue with puree and solids (min with  liquids) and moderate pyriform residue with puree and solids). He was advised to utilize head turn to left after MBSS and pt reports that he only does this when he feels like the residue is building (about 3 times per meal). Pharyngeal strengthening exercises were introduced and provided in written form. Pt able to return demonstrate with moderate cues. Pt reports some weakness noted on his left side and clumsiness. He was encouraged to notify his doctor. Continue with POC.    Speech Therapy Frequency 1x /week   Duration Other (comment)  3 weeks   Treatment/Interventions Aspiration precaution training;Pharyngeal strengthening exercises;Diet toleration management by SLP;Compensatory techniques;SLP instruction and feedback;Patient/family education   Potential to Achieve Goals Fair   Potential Considerations Previous level of function   SLP Home Exercise Plan complete as assigned to faciliate carryover of treatment strategies and techniques at home   Consulted and Agree with Plan of Care Patient          G-Codes - July 14, 2014 1733    Functional Assessment  Tool Used clinical judgement   Functional Limitations Swallowing   Swallow Current Status (G8366) At least 20 percent but less than 40 percent impaired, limited or restricted   Swallow Goal Status (Q9476) At least 1 percent but less than 20 percent impaired, limited or restricted      Problem List Patient Active Problem List   Diagnosis Date Noted  . History of colonic polyps   . Encounter for screening colonoscopy 05/07/2014  . Psoriasis 10/15/2013  . Hypoxemia 09/05/2013  . SVT (supraventricular tachycardia) 06/06/2013  . COPD (chronic obstructive pulmonary disease) 06/06/2013  . Cardiac pacemaker in situ 06/06/2013  . Pacemaker 05/22/2013  . Cirrhosis 01/22/2013  . Dyspepsia 01/22/2013  . Anemia 01/22/2013  . Atrial fibrillation 11/21/2012  . Atrial flutter 11/21/2012  . Chest pain 11/20/2012  . COPD exacerbation 11/20/2012  .  Acute on chronic diastolic heart failure, predominantly Right heart failure 01/09/2012  . Syncope 01/06/2012  . Sinus arrest, 5 second pause on event monitor 01/06/2012  . Diabetes mellitus, type 2 IDDM 01/06/2012  . Obesity 01/06/2012  . Smoker 01/06/2012  . Obstructive sleep apnea 01/06/2012  . DJD, on disability secondary to back pain, surg X 3 01/06/2012  . ABDOMINAL PAIN, RIGHT UPPER QUADRANT 06/14/2009  . ABDOMINAL PAIN 05/14/2009  . Hepatic cirrhosis 01/13/2009  . GERD 12/04/2008  . GASTRITIS, ACUTE 12/04/2008  . HOARSENESS 12/04/2008  . CHEST PAIN 12/04/2008  . Dysphagia, pharyngoesophageal phase 12/04/2008  Thank you,  Genene Churn, [place]   Desarae Placide 2014/07/14, 5:35 PM  [place] [address] [place], [place], [address] Phone: [phone]   Fax:  [phone]

## 2014-06-26 ENCOUNTER — Ambulatory Visit (HOSPITAL_COMMUNITY): Payer: Medicare HMO | Admitting: Speech Pathology

## 2014-06-26 DIAGNOSIS — R1314 Dysphagia, pharyngoesophageal phase: Secondary | ICD-10-CM

## 2014-06-26 NOTE — Therapy (Signed)
[place], [place], [address] Phone: [phone]   Fax:  [phone]  Speech Language Pathology Treatment  Patient Details  Name: Ralph [name] MRN: [id number] Date of Birth: Oct 19, 1950 Referring Provider:  Sharilyn Sites, MD  Encounter Date: 06/26/2014      End of Session - 06/26/14 1641    Visit Number 2   Number of Visits 4   Authorization Type [employer]   Authorization - Visit Number 2   Authorization - Number of Visits 4   SLP Start Time 5361   SLP Stop Time  1430   SLP Time Calculation (min) 45 min   Activity Tolerance Patient tolerated treatment well      Past Medical History  Diagnosis Date  . Hypertension   . Cirrhosis     NASH-Hep A and B immune  . Depressed   . Varicose vein     of esophagus  . Difficult intubation     "trouble waking up afterwards" (01/06/2012)  . CHF (congestive heart failure) 01/06/2012  . Sinus pause 01/06/2012    5.2 seconds  . Anginal pain   . Sleep apnea     "don't wear mask" (01/06/2012)  . Emphysema   . Type II diabetes mellitus   . GERD (gastroesophageal reflux disease)   . H/O hiatal hernia   . Coughing up blood     "comes from my throat" (01/06/2012)  . Arthritis     "back; fingers" (01/06/2012)  . Chronic lower back pain   . Fatty liver disease, nonalcoholic   . CHB (complete heart block)   . Presence of permanent cardiac pacemaker 9/292013    [employer]  . Atrial flutter     s/p EPS +RF ablation of typical atrial flutter April 2015    Past Surgical History  Procedure Laterality Date  . Back surgery    . Spinal cord stimulator implant  2006  . Cholecystectomy  1993  . Nasal septum surgery  1992  . Tonsillectomy and adenoidectomy  1992  . Posterior fusion lumbar spine  1999    L4-5  . Lumbar disc surgery  1994; ~ 1995; ~ 1996  . Warthin's tumor excision  1990's    right  . Permanent pacemaker insertion  01/08/2012    CHB  . US echocardiography   12/28/2011    mild LVH,mild mitral annulara ca+,mild MR  . Nuclear stress test  10/19/2004    No ischemia  . Esophagogastroduodenoscopy  11/08/2004    WER:XVQMGQ esophageal erosions consistent with erosive reflux esophagitis/Areas of hemorrhage and nodularity of the fundal mucosa of uncertain significance, biopsied.  Small hiatal hernia, otherwise normal stomach  . Colonoscopy  11/08/2004    QPY:PPJKDT rectum, colon, TI.  Marland Kitchen Esophagogastroduodenoscopy  2010    Dr. Gala Romney: 3 columns Grade 1 varices, erosive esophagitis, HH, portal gastropathy, normal D1, D2  . Esophagogastroduodenoscopy (egd) with esophageal dilation N/A 02/14/2013    OIZ:TIWPY 1 esophageal varices. Abnormal distal esophagus/status post biopsy after Maloney dilation. Portal gastropathy. Antral erosions-status post biopsy. path negative for H.pylori, benign path.  . Permanent pacemaker insertion N/A 01/09/2012    Procedure: PERMANENT PACEMAKER INSERTION;  Surgeon: Sanda Klein, MD;  Location: [facility] CATH LAB;  Service: Cardiovascular;  Laterality: N/A;  . Atrial flutter ablation N/A 07/10/2013    Procedure: ATRIAL FLUTTER ABLATION;  Surgeon: Evans Lance, MD;  Location: [facility] CATH LAB;  Service: Cardiovascular;  Laterality: N/A;  . Colonoscopy N/A 05/28/2014  Procedure: COLONOSCOPY;  Surgeon: Daneil Dolin, MD;  Location: [facility] ENDO SUITE;  Service: Endoscopy;  Laterality: N/A;  1045am  . Esophagogastroduodenoscopy N/A 05/28/2014    Procedure: ESOPHAGOGASTRODUODENOSCOPY (EGD);  Surgeon: Daneil Dolin, MD;  Location: [facility];  Service: Endoscopy;  Laterality: N/A;  . Esophageal dilation N/A 05/28/2014    Procedure: ESOPHAGEAL DILATION;  Surgeon: Daneil Dolin, MD;  Location: [facility];  Service: Endoscopy;  Laterality: N/A;    There were no vitals filed for this visit.  Visit Diagnosis: Dysphagia, pharyngoesophageal phase      Subjective Assessment - 06/26/14 1638    Symptoms "It is a little bit better."   Currently in  Pain? No/denies               ADULT SLP TREATMENT - 06/26/14 0001    General Information   Behavior/Cognition Alert;Cooperative;Pleasant mood   Patient Positioning Upright in chair   Oral care provided N/A   HPI Ralph [name] was seen for MBSS on 05/29/2014 and noted to have significant vallecular and pyriform residuals with solids and puree post swallow. Dysphagia therapy was recommended.   Treatment Provided   Treatment provided Dysphagia   Dysphagia Treatment   Temperature Spikes Noted No   Respiratory Status Nasal cannula  did not bring today because battery died   Oral Cavity - Dentition Dentures, top   Treatment Methods Therapeutic exercise;Compensation strategy training;Patient/caregiver education   Patient observed directly with PO's Yes   Type of PO's observed Regular;Thin liquids   Feeding Able to feed self   Liquids provided via Cup   Pharyngeal Phase Signs & Symptoms Complaints of globus;Delayed cough   Type of cueing Verbal;Visual;Tactile   Amount of cueing Modified independent   Other treatment/comments Pt implemented head turn left independently when he felt globus sensation   Pain Assessment   Pain Assessment No/denies pain   Assessment / Recommendations / Plan   Plan Discharge SLP treatment due to (comment);All goals met   Dysphagia Recommendations   Diet recommendations Regular;Thin liquid   Liquids provided via Cup;Straw   Medication Administration Whole meds with liquid   Supervision Patient able to self feed   Compensations Multiple dry swallows after each bite/sip;Follow solids with liquid;Effortful swallow   Postural Changes and/or Swallow Maneuvers Out of bed for meals;Seated upright 90 degrees;Upright 30-60 min after meal;Head turn left during swallow   General Recommendations   Oral Care Recommendations Oral care BID   Follow up Recommendations None   Progression Toward Goals   Progression toward goals Goals met, education completed, patient  discharged from [place] - 06/26/14 1644    SLP SHORT TERM GOAL #1   Title Pt will complete pharyngeal strengthening exercises with use of written cue.   Baseline Mod assist   Time 3   Period Weeks   Status Achieved   SLP SHORT TERM GOAL #2   Title Pt will verbalize compensatory strategies to increase safe swallow function independently   Baseline mod cues   Time 3   Period Weeks   Status Achieved            Plan - 06/26/14 1641    Clinical Impression Statement Ralph [name] was able to demonstrate all pharyngeal exercises independently and implement compensatory strategies independently (head turn to left). All exercises were completed this session and pt confident with HEP. Recommend discharge to home program. Pt in  agreement with plan. Swallow should be monitored for any changes and pt to continue with exercises going forward.    Duration --  3 weeks   Treatment/Interventions Aspiration precaution training;Pharyngeal strengthening exercises;Diet toleration management by SLP;Compensatory techniques;SLP instruction and feedback;Patient/family education   Potential to Achieve Goals Fair   Potential Considerations Previous level of function   SLP Home Exercise Plan complete as assigned to faciliate carryover of treatment strategies and techniques at home   Consulted and Agree with Plan of Care Patient          G-Codes - 07/02/2014 1644    Functional Assessment Tool Used clinical judgement   Functional Limitations Swallowing   Swallow Goal Status (I2641) At least 1 percent but less than 20 percent impaired, limited or restricted   Swallow Discharge Status [phone]) At least 1 percent but less than 20 percent impaired, limited or restricted      Problem List Patient Active Problem List   Diagnosis Date Noted  . History of colonic polyps   . Encounter for screening colonoscopy 05/07/2014  . Psoriasis 10/15/2013  . Hypoxemia 09/05/2013  . SVT  (supraventricular tachycardia) 06/06/2013  . COPD (chronic obstructive pulmonary disease) 06/06/2013  . Cardiac pacemaker in situ 06/06/2013  . Pacemaker 05/22/2013  . Cirrhosis 01/22/2013  . Dyspepsia 01/22/2013  . Anemia 01/22/2013  . Atrial fibrillation 11/21/2012  . Atrial flutter 11/21/2012  . Chest pain 11/20/2012  . COPD exacerbation 11/20/2012  .  Acute on chronic diastolic heart failure, predominantly Right heart failure 01/09/2012  . Syncope 01/06/2012  . Sinus arrest, 5 second pause on event monitor 01/06/2012  . Diabetes mellitus, type 2 IDDM 01/06/2012  . Obesity 01/06/2012  . Smoker 01/06/2012  . Obstructive sleep apnea 01/06/2012  . DJD, on disability secondary to back pain, surg X 3 01/06/2012  . ABDOMINAL PAIN, RIGHT UPPER QUADRANT 06/14/2009  . ABDOMINAL PAIN 05/14/2009  . Hepatic cirrhosis 01/13/2009  . GERD 12/04/2008  . GASTRITIS, ACUTE 12/04/2008  . HOARSENESS 12/04/2008  . CHEST PAIN 12/04/2008  . Dysphagia, pharyngoesophageal phase 12/04/2008    SPEECH THERAPY DISCHARGE SUMMARY  Visits from Start of Care: 3 (including MBSS)  Current functional level related to goals / functional outcomes: Pt met goals and is independent with pharyngeal strengthening exercises and compensatory strategies (head turn left).   Remaining deficits: Pt will need to continue with HEP and compensatory strategies going forward.   Education / Equipment: completed  Plan: Patient agrees to discharge.  Patient goals were met. Patient is being discharged due to meeting the stated rehab goals.  ?????       Thank you,  Genene Churn, Martha  [facility] 07/02/2014, 4:45 PM  [place] [address] [place], [place], [address] Phone: [phone]   Fax:  [phone]

## 2014-07-03 ENCOUNTER — Ambulatory Visit (HOSPITAL_COMMUNITY): Payer: Medicare HMO | Admitting: Speech Pathology

## 2014-07-10 ENCOUNTER — Ambulatory Visit (HOSPITAL_COMMUNITY): Payer: Medicare HMO | Admitting: Speech Pathology

## 2014-07-16 ENCOUNTER — Other Ambulatory Visit: Payer: Self-pay

## 2014-07-17 ENCOUNTER — Other Ambulatory Visit: Payer: Self-pay

## 2014-07-18 ENCOUNTER — Encounter: Payer: Self-pay | Admitting: Gastroenterology

## 2014-07-18 MED ORDER — ONDANSETRON HCL 4 MG PO TABS
4.0000 mg | ORAL_TABLET | Freq: Three times a day (TID) | ORAL | Status: DC | PRN
Start: 2014-07-18 — End: 2014-10-10

## 2014-07-21 MED ORDER — ONDANSETRON HCL 4 MG PO TABS: 4.0000 mg | ORAL_TABLET | Freq: Three times a day (TID) | ORAL | Status: DC | PRN

## 2014-07-30 ENCOUNTER — Encounter: Payer: Self-pay | Admitting: Gastroenterology

## 2014-08-05 ENCOUNTER — Other Ambulatory Visit: Payer: Self-pay

## 2014-08-05 DIAGNOSIS — K7460 Unspecified cirrhosis of liver: Secondary | ICD-10-CM

## 2014-08-22 ENCOUNTER — Encounter: Payer: Medicare Other | Admitting: Internal Medicine

## 2014-08-26 ENCOUNTER — Emergency Department (HOSPITAL_COMMUNITY): Payer: Medicare HMO

## 2014-08-26 ENCOUNTER — Emergency Department (HOSPITAL_COMMUNITY)
Admission: EM | Admit: 2014-08-26 | Discharge: 2014-08-26 | Disposition: A | Discharge status: Home / Self Care | Payer: Medicare HMO | Attending: Emergency Medicine | Admitting: Emergency Medicine

## 2014-08-26 ENCOUNTER — Encounter (HOSPITAL_COMMUNITY): Payer: Self-pay | Admitting: Emergency Medicine

## 2014-08-26 DIAGNOSIS — Z794 Long term (current) use of insulin: Secondary | ICD-10-CM | POA: Insufficient documentation

## 2014-08-26 DIAGNOSIS — G8929 Other chronic pain: Secondary | ICD-10-CM | POA: Diagnosis not present

## 2014-08-26 DIAGNOSIS — J439 Emphysema, unspecified: Secondary | ICD-10-CM | POA: Diagnosis not present

## 2014-08-26 DIAGNOSIS — Z87891 Personal history of nicotine dependence: Secondary | ICD-10-CM | POA: Diagnosis not present

## 2014-08-26 DIAGNOSIS — I509 Heart failure, unspecified: Secondary | ICD-10-CM | POA: Insufficient documentation

## 2014-08-26 DIAGNOSIS — I10 Essential (primary) hypertension: Secondary | ICD-10-CM | POA: Insufficient documentation

## 2014-08-26 DIAGNOSIS — E119 Type 2 diabetes mellitus without complications: Secondary | ICD-10-CM | POA: Diagnosis not present

## 2014-08-26 DIAGNOSIS — Z79899 Other long term (current) drug therapy: Secondary | ICD-10-CM | POA: Diagnosis not present

## 2014-08-26 DIAGNOSIS — R1010 Upper abdominal pain, unspecified: Secondary | ICD-10-CM

## 2014-08-26 DIAGNOSIS — M1990 Unspecified osteoarthritis, unspecified site: Secondary | ICD-10-CM | POA: Diagnosis not present

## 2014-08-26 DIAGNOSIS — M549 Dorsalgia, unspecified: Secondary | ICD-10-CM | POA: Diagnosis not present

## 2014-08-26 DIAGNOSIS — R630 Anorexia: Secondary | ICD-10-CM | POA: Insufficient documentation

## 2014-08-26 DIAGNOSIS — R1012 Left upper quadrant pain: Secondary | ICD-10-CM | POA: Insufficient documentation

## 2014-08-26 DIAGNOSIS — Z590 Homelessness: Secondary | ICD-10-CM | POA: Insufficient documentation

## 2014-08-26 DIAGNOSIS — F329 Major depressive disorder, single episode, unspecified: Secondary | ICD-10-CM | POA: Diagnosis not present

## 2014-08-26 DIAGNOSIS — Z7901 Long term (current) use of anticoagulants: Secondary | ICD-10-CM | POA: Insufficient documentation

## 2014-08-26 HISTORY — DX: Presence of cardiac pacemaker: Z95.0

## 2014-08-26 LAB — COMPREHENSIVE METABOLIC PANEL
ALT: 52 U/L (ref 17–63)
AST: 32 U/L (ref 15–41)
Albumin: 4.4 g/dL (ref 3.5–5.0)
Alkaline Phosphatase: 170 U/L — ABNORMAL HIGH (ref 38–126)
Anion gap: 11 (ref 5–15)
BILIRUBIN TOTAL: 0.5 mg/dL (ref 0.3–1.2)
BUN: 35 mg/dL — ABNORMAL HIGH (ref 6–20)
CHLORIDE: 99 mmol/L — AB (ref 101–111)
CO2: 26 mmol/L (ref 22–32)
Calcium: 9.5 mg/dL (ref 8.9–10.3)
Creatinine, Ser: 0.94 mg/dL (ref 0.61–1.24)
GFR calc Af Amer: >60 mL/min (ref >60)
Glucose, Bld: 120 mg/dL — ABNORMAL HIGH (ref 65–99)
POTASSIUM: 4.2 mmol/L (ref 3.5–5.1)
SODIUM: 136 mmol/L (ref 135–145)
Total Protein: 8 g/dL (ref 6.5–8.1)

## 2014-08-26 LAB — CBC WITH DIFFERENTIAL/PLATELET
Basophils Absolute: 0 10*3/uL (ref 0.0–0.1)
Basophils Relative: 0 % (ref 0–1)
Eosinophils Absolute: 0.5 10*3/uL (ref 0.0–0.7)
Eosinophils Relative: 6 % — ABNORMAL HIGH (ref 0–5)
HEMATOCRIT: 48 % (ref 39.0–52.0)
Hemoglobin: 16.1 g/dL (ref 13.0–17.0)
LYMPHS PCT: 24 % (ref 12–46)
Lymphs Abs: 2.1 10*3/uL (ref 0.7–4.0)
MCH: 33 pg (ref 26.0–34.0)
MCHC: 33.5 g/dL (ref 30.0–36.0)
MCV: 98.4 fL (ref 78.0–100.0)
Monocytes Absolute: 0.6 10*3/uL (ref 0.1–1.0)
Monocytes Relative: 6 % (ref 3–12)
Neutro Abs: 5.8 10*3/uL (ref 1.7–7.7)
Neutrophils Relative %: 64 % (ref 43–77)
PLATELETS: 238 10*3/uL (ref 150–400)
RBC: 4.88 MIL/uL (ref 4.22–5.81)
RDW: 13.3 % (ref 11.5–15.5)
WBC: 9.1 10*3/uL (ref 4.0–10.5)

## 2014-08-26 LAB — LACTIC ACID, PLASMA: Lactic Acid, Venous: 1.2 mmol/L (ref 0.5–2.0)

## 2014-08-26 LAB — LIPASE, BLOOD: LIPASE: 41 U/L (ref 22–51)

## 2014-08-26 LAB — CBG MONITORING, ED: Glucose-Capillary: 123 mg/dL — ABNORMAL HIGH (ref 65–99)

## 2014-08-26 LAB — TROPONIN I: Troponin I: <0.03 ng/mL (ref <0.031)

## 2014-08-26 MED ORDER — TRAMADOL HCL 50 MG PO TABS: 50.0000 mg | ORAL_TABLET | Freq: Four times a day (QID) | ORAL | Status: DC | PRN

## 2014-08-26 MED ORDER — MORPHINE SULFATE 4 MG/ML IJ SOLN
INTRAMUSCULAR | Status: AC
  Filled 2014-08-26: qty 1

## 2014-08-26 MED ORDER — IOHEXOL 350 MG/ML SOLN
100.0000 mL | Freq: Once | INTRAVENOUS | Status: AC | PRN
  Administered 2014-08-26: 100 mL via INTRAVENOUS

## 2014-08-26 MED ORDER — MORPHINE SULFATE 4 MG/ML IJ SOLN
4.0000 mg | Freq: Once | INTRAMUSCULAR | Status: AC
Start: 2014-08-26 — End: 2014-08-26
  Administered 2014-08-26: 4 mg via INTRAVENOUS
  Filled 2014-08-26: qty 1

## 2014-08-26 MED ORDER — MORPHINE SULFATE 4 MG/ML IJ SOLN
4.0000 mg | Freq: Once | INTRAMUSCULAR | Status: AC
  Administered 2014-08-26: 4 mg via INTRAVENOUS

## 2014-08-26 MED ORDER — SODIUM CHLORIDE 0.9 % IV SOLN
Freq: Once | INTRAVENOUS | Status: AC
  Administered 2014-08-26: 13:00:00 via INTRAVENOUS

## 2014-08-26 NOTE — ED Notes (Signed)
Pt states that he has been having left upper quad pain just below ribs that radiates straight through to back.  Denies other symptoms.  States that this has been occurring for a few days.

## 2014-08-26 NOTE — ED Notes (Signed)
PT c/o LUQ abdominal pain that radiates to his back x1 day with reported nausea but no vomiting or diarrhea. Last BM 08/26/14. PT was sent from primary MD office (Dr. Hilma Favors) for follow up. PT on continuous oxygen at 2L per n/c. PT denies any fevers or urinary symptoms.

## 2014-08-26 NOTE — Discharge Instructions (Signed)
Follow up with your md in 2-3 days for recheck °

## 2014-08-26 NOTE — ED Notes (Signed)
Pt states that he just provided a urine sample at the doctor's office and is unable to go at this time.

## 2014-08-28 LAB — HEPATIC FUNCTION PANEL
ALT: 52 U/L (ref 0–53)
AST: 34 U/L (ref 0–37)
Albumin: 4.5 g/dL (ref 3.5–5.2)
Alkaline Phosphatase: 170 U/L — ABNORMAL HIGH (ref 39–117)
BILIRUBIN TOTAL: 0.6 mg/dL (ref 0.2–1.2)
Bilirubin, Direct: 0.1 mg/dL (ref 0.0–0.3)
Indirect Bilirubin: 0.5 mg/dL (ref 0.2–1.2)
Total Protein: 7.6 g/dL (ref 6.0–8.3)

## 2014-09-02 ENCOUNTER — Other Ambulatory Visit: Payer: Self-pay | Admitting: Internal Medicine

## 2014-09-02 DIAGNOSIS — K7469 Other cirrhosis of liver: Secondary | ICD-10-CM

## 2014-09-04 NOTE — Progress Notes (Signed)
Quick Note:  Recall made ______ 

## 2014-09-12 NOTE — ED Provider Notes (Signed)
CSN: [id number]     Arrival date & time 08/26/14  1234 History   First MD Initiated Contact with Patient 08/26/14 1245     Chief Complaint  Patient presents with  . Abdominal Pain     (Consider location/radiation/quality/duration/timing/severity/associated sxs/prior Treatment) HPI Comments: 64 year old male with, care medical history including COPD sleep apnea, diabetes, cirrhosis, atrial fibrillation presents with left upper abdominal pain rating to the back sharp and ache sensation for one day. Nothing specifically improves or worsens. Patient sent from primary doctor. No fevers or chills. No urinary symptoms. No history of similar. No aneurysms known.  Patient is a 64 y.o. male presenting with abdominal pain. The history is provided by the patient.  Abdominal Pain Associated symptoms: no chest pain, no chills, no dysuria, no fever, no shortness of breath and no vomiting     Past Medical History  Diagnosis Date  . Hypertension   . Cirrhosis     NASH-Hep A and B immune  . Depressed   . Varicose vein     of esophagus  . Difficult intubation     "trouble waking up afterwards" (01/06/2012)  . CHF (congestive heart failure) 01/06/2012  . Sinus pause 01/06/2012    5.2 seconds  . Anginal pain   . Sleep apnea     "don't wear mask" (01/06/2012)  . Emphysema   . Type II diabetes mellitus   . GERD (gastroesophageal reflux disease)   . H/O hiatal hernia   . Coughing up blood     "comes from my throat" (01/06/2012)  . Arthritis     "back; fingers" (01/06/2012)  . Chronic lower back pain   . Fatty liver disease, nonalcoholic   . CHB (complete heart block)   . Presence of permanent cardiac pacemaker 9/292013    [employer]  . Atrial flutter     s/p EPS +RF ablation of typical atrial flutter April 2015  . Pacemaker    Past Surgical History  Procedure Laterality Date  . Back surgery    . Spinal cord stimulator implant  2006  . Cholecystectomy  1993  . Nasal septum surgery  1992  .  Tonsillectomy and adenoidectomy  1992  . Posterior fusion lumbar spine  1999    L4-5  . Lumbar disc surgery  1994; ~ 1995; ~ 1996  . Warthin's tumor excision  1990's    right  . Permanent pacemaker insertion  01/08/2012    CHB  . US echocardiography  12/28/2011    mild LVH,mild mitral annulara ca+,mild MR  . Nuclear stress test  10/19/2004    No ischemia  . Esophagogastroduodenoscopy  11/08/2004    OTL:XBWIOM esophageal erosions consistent with erosive reflux esophagitis/Areas of hemorrhage and nodularity of the fundal mucosa of uncertain significance, biopsied.  Small hiatal hernia, otherwise normal stomach  . Colonoscopy  11/08/2004    BTD:HRCBUL rectum, colon, TI.  Marland Kitchen Esophagogastroduodenoscopy  2010    Dr. Gala Romney: 3 columns Grade 1 varices, erosive esophagitis, HH, portal gastropathy, normal D1, D2  . Esophagogastroduodenoscopy (egd) with esophageal dilation N/A 02/14/2013    AGT:XMIWO 1 esophageal varices. Abnormal distal esophagus/status post biopsy after Maloney dilation. Portal gastropathy. Antral erosions-status post biopsy. path negative for H.pylori, benign path.  . Permanent pacemaker insertion N/A 01/09/2012    Procedure: PERMANENT PACEMAKER INSERTION;  Surgeon: Sanda Klein, MD;  Location: [facility];  Service: Cardiovascular;  Laterality: N/A;  . Atrial flutter ablation N/A 07/10/2013    Procedure: ATRIAL FLUTTER ABLATION;  Surgeon: Carleene Overlie  Peyton Najjar, MD;  Location: [facility];  Service: Cardiovascular;  Laterality: N/A;  . Colonoscopy N/A 05/28/2014    Procedure: COLONOSCOPY;  Surgeon: Daneil Dolin, MD;  Location: [facility];  Service: Endoscopy;  Laterality: N/A;  1045am  . Esophagogastroduodenoscopy N/A 05/28/2014    Procedure: ESOPHAGOGASTRODUODENOSCOPY (EGD);  Surgeon: Daneil Dolin, MD;  Location: [facility];  Service: Endoscopy;  Laterality: N/A;  . Esophageal dilation N/A 05/28/2014    Procedure: ESOPHAGEAL DILATION;  Surgeon: Daneil Dolin, MD;  Location: [facility];  Service: Endoscopy;  Laterality: N/A;   Family History  Problem Relation Age of Onset  . Stroke Brother   . Cancer Mother   . Arrhythmia Father    History  Substance Use Topics  . Smoking status: Former Smoker -- 3.00 packs/day for 45 years    Types: Cigarettes    Start date: 04/11/1968    Quit date: 03/11/2013  . Smokeless tobacco: Former Systems developer    Quit date: 11/13/2012     Comment: using Welbutrin and e-cigarettes-stopped smoking 12-03-12  . Alcohol Use: No     Comment: "quit alcohol 2011"    Review of Systems  Constitutional: Positive for appetite change. Negative for fever and chills.  HENT: Negative for congestion.   Eyes: Negative for visual disturbance.  Respiratory: Negative for shortness of breath.   Cardiovascular: Negative for chest pain.  Gastrointestinal: Positive for abdominal pain. Negative for vomiting.  Genitourinary: Negative for dysuria and flank pain.  Musculoskeletal: Positive for back pain. Negative for neck pain and neck stiffness.  Skin: Negative for rash.  Neurological: Negative for weakness, light-headedness and headaches.      Allergies  Nitroglycerin  Home Medications   Prior to Admission medications   Medication Sig Start Date End Date Taking? Authorizing Provider  albuterol (PROVENTIL HFA;VENTOLIN HFA) 108 (90 BASE) MCG/ACT inhaler Inhale 2 puffs into the lungs every 6 (six) hours as needed for wheezing.   Yes Historical Provider, MD  buPROPion (WELLBUTRIN SR) 150 MG 12 hr tablet Take 150 mg by mouth 2 (two) times daily.   Yes Historical Provider, MD  Canagliflozin (INVOKANA) 300 MG TABS Take 300 mg by mouth daily.    Yes Historical Provider, MD  Cholecalciferol (VITAMIN D PO) Take 1 tablet by mouth daily.   Yes Historical Provider, MD  diazepam (VALIUM) 10 MG tablet Take 10 mg by mouth every 6 (six) hours as needed for anxiety.   Yes Historical Provider, MD  diltiazem (CARDIZEM CD) 240 MG 24 hr capsule Take 1 capsule (240 mg total)  by mouth every morning. 05/21/14  Yes Arnoldo Lenis, MD  escitalopram (LEXAPRO) 10 MG tablet Take 10 mg by mouth daily.   Yes Historical Provider, MD  flecainide (TAMBOCOR) 100 MG tablet TAKE 1 TABLET BY MOUTH TWICE A DAY 02/06/14  Yes Arnoldo Lenis, MD  furosemide (LASIX) 20 MG tablet Take 2 tablets (40 mg total) by mouth every morning. 03/10/14  Yes Orvil Feil, NP  HYDROcodone-acetaminophen (NORCO) 10-325 MG per tablet Take 1 tablet by mouth every 6 (six) hours as needed for pain.   Yes Historical Provider, MD  insulin aspart (NOVOLOG) 100 UNIT/ML injection Inject 10-15 Units into the skin 3 (three) times daily before meals.   Yes Historical Provider, MD  LEVEMIR 100 UNIT/ML injection Inject 70 Units into the skin every evening. 07/13/14  Yes Historical Provider, MD  levothyroxine (SYNTHROID, LEVOTHROID) 88 MCG tablet Take 88 mcg by mouth daily before  breakfast.   Yes Historical Provider, MD  metFORMIN (GLUCOPHAGE) 1000 MG tablet Take 1,000 mg by mouth 2 (two) times daily with a meal.   Yes Historical Provider, MD  mometasone-formoterol (DULERA) 100-5 MCG/ACT AERO Inhale 2 puffs into the lungs 2 (two) times daily.   Yes Historical Provider, MD  Multiple Vitamin (MULTIVITAMIN) tablet Take 1 tablet by mouth daily.   Yes Historical Provider, MD  nadolol (CORGARD) 40 MG tablet Take 1 tablet (40 mg total) by mouth daily. 11/05/13  Yes Daneil Dolin, MD  ondansetron (ZOFRAN) 4 MG tablet Take 1 tablet (4 mg total) by mouth every 8 (eight) hours as needed for nausea or vomiting. 07/21/14  Yes Mahala Menghini, PA-C  pantoprazole (PROTONIX) 40 MG tablet Take 40 mg by mouth every morning.   Yes Historical Provider, MD  rivaroxaban (XARELTO) 20 MG TABS tablet Take 1 tablet (20 mg total) by mouth daily with supper. 06/05/14  Yes Arnoldo Lenis, MD  simvastatin (ZOCOR) 20 MG tablet Take 1 tablet (20 mg total) by mouth every evening. 05/21/14  Yes Arnoldo Lenis, MD  spironolactone (ALDACTONE) 50 MG  tablet Take 1 tablet (50 mg total) by mouth 2 (two) times daily. 06/03/14  Yes Mahala Menghini, PA-C  Umeclidinium-Vilanterol (ANORO ELLIPTA) 62.5-25 MCG/INH AEPB 1 puff once daily 10/15/13  Yes Collene Gobble, MD  ondansetron (ZOFRAN) 4 MG tablet Take 1 tablet (4 mg total) by mouth every 8 (eight) hours as needed for nausea or vomiting. Patient not taking: Reported on 08/26/2014 07/18/14   Orvil Feil, NP  polyethylene glycol-electrolytes (NULYTELY/GOLYTELY) 420 G solution Take 4,000 mLs by mouth once. Patient not taking: Reported on 08/26/2014 05/07/14   Orvil Feil, NP  traMADol (ULTRAM) 50 MG tablet Take 1 tablet (50 mg total) by mouth every 6 (six) hours as needed. 08/26/14   Milton Ferguson, MD   BP 130/59 mmHg  Pulse 61  Temp(Src) 97.8 F (36.6 C) (Oral)  Resp 20  Ht 5\' 10"  (1.778 m)  Wt 248 lb (112.492 kg)  BMI 35.58 kg/m2  SpO2 96% Physical Exam  Constitutional: He is oriented to person, place, and time. He appears well-developed and well-nourished.  HENT:  Head: Normocephalic and atraumatic.  Mild dry mucous membranes  Eyes: Conjunctivae are normal. Right eye exhibits no discharge. Left eye exhibits no discharge.  Neck: Normal range of motion. Neck supple. No tracheal deviation present.  Cardiovascular: Normal rate and regular rhythm.   Pulmonary/Chest: Effort normal and breath sounds normal.  Abdominal: Soft. He exhibits no distension. There is tenderness (mild left mid and left upper abdominal tenderness). There is no guarding.  Musculoskeletal: He exhibits no edema.  Neurological: He is alert and oriented to person, place, and time.  Skin: Skin is warm. No rash noted.  Psychiatric: He has a normal mood and affect.  Nursing note and vitals reviewed.   ED Course  Procedures (including critical care time) Labs Review Labs Reviewed  CBC WITH DIFFERENTIAL/PLATELET - Abnormal; Notable for the following:    Eosinophils Relative 6 (*)    All other components within normal limits   COMPREHENSIVE METABOLIC PANEL - Abnormal; Notable for the following:    Chloride 99 (*)    Glucose, Bld 120 (*)    BUN 35 (*)    Alkaline Phosphatase 170 (*)    All other components within normal limits  CBG MONITORING, ED - Abnormal; Notable for the following:    Glucose-Capillary 123 (*)  All other components within normal limits  TROPONIN I  LACTIC ACID, PLASMA  LIPASE, BLOOD    Imaging Review No results found. Ct Angio Chest Aorta W/cm &/or Wo/cm  08/26/2014   CLINICAL DATA:  64 year old male with left upper quadrant pain radiating into the back accompanied by nausea  EXAM: CT ANGIOGRAPHY CHEST, ABDOMEN AND PELVIS  TECHNIQUE: Multidetector CT imaging through the chest, abdomen and pelvis was performed using the standard protocol during bolus administration of intravenous contrast. Multiplanar reconstructed images and MIPs were obtained and reviewed to evaluate the vascular anatomy.  CONTRAST:  141mL OMNIPAQUE IOHEXOL 350 MG/ML SOLN  COMPARISON:  Prior CT abdomen/pelvis 04/19/2013; prior CT scan of the chest 05/11/2011  FINDINGS: CTA CHEST FINDINGS  VASCULAR  Heart/Vascular: On the initial precontrast images, there is no evidence of high attenuation material within the aortic wall to suggest acute intramural hematoma. Following administration of intravenous contrast, there is no evidence of acute dissection, aneurysm or other aortic pathology. Conventional 3 vessel arch anatomy. The aortic root is minimally ectatic at 3.3 cm. Similarly, the ascending thoracic aorta is mildly ectatic at 3.8 cm. The main pulmonary artery is within normal limits for size. No evidence of central pulmonary embolus. The heart is within normal limits for size. No pericardial effusion. Cardiac rhythm maintenance device via a left subclavian approach. Leads terminate in the right atrium and right ventricular apex.  Review of the MIP images confirms the above findings.  NON VASCULAR  Mediastinum: Unremarkable CT  appearance of the thyroid gland. No suspicious mediastinal or hilar adenopathy. No soft tissue mediastinal mass. The thoracic esophagus is unremarkable.  Lungs/Pleura: Background of advanced combined paraseptal and centrilobular emphysema. In the left lung, there is patchy subpleural consolidation versus scarring peripherally and posteriorly in the left apex and superior segment of the left lower lobe. This is new compared to January of 2013. Stable 5 and 8 mm nodular opacities affiliated with the minor fissure dating back to January of 2013. These are highly likely to represent subpleural lymph nodes or old a benign and granulomatous disease. No pleural effusion or pulmonary edema. Diffuse mild bronchial wall thickening.  Bones/Soft Tissues: No acute fracture or aggressive appearing lytic or blastic osseous lesion.  CTA ABDOMEN AND PELVIS FINDINGS  VASCULAR  Aorta: Normal caliber aorta with scant atherosclerotic plaque. No aneurysmal dilatation, dissection, penetrating ulcer or other acute abnormality.  Celiac: Widely patent.  No visceral artery aneurysm.  SMA: Widely patent.  Replaced right hepatic artery.  Renals: Solitary dominant renal arteries bilaterally. No evidence of stenosis or fibromuscular dysplasia.  IMA: Patent and unremarkable.  Inflow: Scattered heterogeneous atherosclerotic plaque without evidence of significant stenosis, dissection or aneurysm.  Proximal Outflow: The visualized portions are unremarkable.  Veins: No focal venous abnormality.  Review of the MIP images confirms the above findings.  NON-VASCULAR  Abdomen: Unremarkable CT appearance of the stomach, duodenum, spleen, adrenal glands and pancreas. Normal hepatic contour and morphology. No discrete hepatic lesion. The gallbladder is surgically absent. There is no evidence of intra or extra hepatic biliary ductal dilatation.  Unremarkable appearance of the bilateral kidneys. No focal solid lesion, hydronephrosis or nephrolithiasis.  No  evidence of obstruction or focal bowel wall thickening. Normal appendix in the right lower quadrant. The terminal ileum is unremarkable. No free fluid or suspicious adenopathy.  Pelvis: Unremarkable bladder, prostate gland and seminal vesicles. No free fluid or suspicious adenopathy.  Bones/Soft Tissues: Surgical changes of prior L1-L2 posterior lumbar interbody fusion and left L2 laminectomy. Additionally,  there is an epidural spinal stimulator in the soft tissues overlying the right gluteal musculature. The electrode enters the posterior epidural space at T6-T8. No acute fracture or aggressive appearing lytic or blastic osseous lesion.  IMPRESSION: VASCULAR  1. No evidence of acute aortic abnormality. 2. Very mild atherosclerotic vascular disease without evidence of focal stenosis, dissection, aneurysm or other abnormality. 3. Mild ectasia of the aortic root and ascending thoracic aorta. 4. Incidental note of a replaced right hepatic artery. NON VASCULAR  1. New (compared to prior chest CT 05/11/2011) patchy subpleural consolidation in the posterior periphery of the left upper lung extending just into the superior segment of the left lower lobe. Differential considerations include acute pneumonia, and pleural parenchymal scarring from prior infection. 2. Diffuse mild bronchial wall thickening. 3. Surgical changes of prior cholecystectomy and L1-L2 posterior lumbar interbody fusion with left L2 laminectomy. 4. Epidural spinal stimulator as described above.  Signed,  Criselda Peaches, MD  [facility]  [facility]   Electronically Signed   By: Jacqulynn Cadet [name]   On: 08/26/2014 14:58   Ct Cta Abd/pel W/cm &/or W/o Cm  08/26/2014   CLINICAL DATA:  64 year old male with left upper quadrant pain radiating into the back accompanied by nausea  EXAM: CT ANGIOGRAPHY CHEST, ABDOMEN AND PELVIS  TECHNIQUE: Multidetector CT imaging through the chest, abdomen and pelvis  was performed using the standard protocol during bolus administration of intravenous contrast. Multiplanar reconstructed images and MIPs were obtained and reviewed to evaluate the vascular anatomy.  CONTRAST:  112mL OMNIPAQUE IOHEXOL 350 MG/ML SOLN  COMPARISON:  Prior CT abdomen/pelvis 04/19/2013; prior CT scan of the chest 05/11/2011  FINDINGS: CTA CHEST FINDINGS  VASCULAR  Heart/Vascular: On the initial precontrast images, there is no evidence of high attenuation material within the aortic wall to suggest acute intramural hematoma. Following administration of intravenous contrast, there is no evidence of acute dissection, aneurysm or other aortic pathology. Conventional 3 vessel arch anatomy. The aortic root is minimally ectatic at 3.3 cm. Similarly, the ascending thoracic aorta is mildly ectatic at 3.8 cm. The main pulmonary artery is within normal limits for size. No evidence of central pulmonary embolus. The heart is within normal limits for size. No pericardial effusion. Cardiac rhythm maintenance device via a left subclavian approach. Leads terminate in the right atrium and right ventricular apex.  Review of the MIP images confirms the above findings.  NON VASCULAR  Mediastinum: Unremarkable CT appearance of the thyroid gland. No suspicious mediastinal or hilar adenopathy. No soft tissue mediastinal mass. The thoracic esophagus is unremarkable.  Lungs/Pleura: Background of advanced combined paraseptal and centrilobular emphysema. In the left lung, there is patchy subpleural consolidation versus scarring peripherally and posteriorly in the left apex and superior segment of the left lower lobe. This is new compared to January of 2013. Stable 5 and 8 mm nodular opacities affiliated with the minor fissure dating back to January of 2013. These are highly likely to represent subpleural lymph nodes or old a benign and granulomatous disease. No pleural effusion or pulmonary edema. Diffuse mild bronchial wall  thickening.  Bones/Soft Tissues: No acute fracture or aggressive appearing lytic or blastic osseous lesion.  CTA ABDOMEN AND PELVIS FINDINGS  VASCULAR  Aorta: Normal caliber aorta with scant atherosclerotic plaque. No aneurysmal dilatation, dissection, penetrating ulcer or other acute abnormality.  Celiac: Widely patent.  No visceral artery aneurysm.  SMA: Widely patent.  Replaced right hepatic artery.  Renals: Solitary dominant renal arteries  bilaterally. No evidence of stenosis or fibromuscular dysplasia.  IMA: Patent and unremarkable.  Inflow: Scattered heterogeneous atherosclerotic plaque without evidence of significant stenosis, dissection or aneurysm.  Proximal Outflow: The visualized portions are unremarkable.  Veins: No focal venous abnormality.  Review of the MIP images confirms the above findings.  NON-VASCULAR  Abdomen: Unremarkable CT appearance of the stomach, duodenum, spleen, adrenal glands and pancreas. Normal hepatic contour and morphology. No discrete hepatic lesion. The gallbladder is surgically absent. There is no evidence of intra or extra hepatic biliary ductal dilatation.  Unremarkable appearance of the bilateral kidneys. No focal solid lesion, hydronephrosis or nephrolithiasis.  No evidence of obstruction or focal bowel wall thickening. Normal appendix in the right lower quadrant. The terminal ileum is unremarkable. No free fluid or suspicious adenopathy.  Pelvis: Unremarkable bladder, prostate gland and seminal vesicles. No free fluid or suspicious adenopathy.  Bones/Soft Tissues: Surgical changes of prior L1-L2 posterior lumbar interbody fusion and left L2 laminectomy. Additionally, there is an epidural spinal stimulator in the soft tissues overlying the right gluteal musculature. The electrode enters the posterior epidural space at T6-T8. No acute fracture or aggressive appearing lytic or blastic osseous lesion.  IMPRESSION: VASCULAR  1. No evidence of acute aortic abnormality. 2. Very  mild atherosclerotic vascular disease without evidence of focal stenosis, dissection, aneurysm or other abnormality. 3. Mild ectasia of the aortic root and ascending thoracic aorta. 4. Incidental note of a replaced right hepatic artery. NON VASCULAR  1. New (compared to prior chest CT 05/11/2011) patchy subpleural consolidation in the posterior periphery of the left upper lung extending just into the superior segment of the left lower lobe. Differential considerations include acute pneumonia, and pleural parenchymal scarring from prior infection. 2. Diffuse mild bronchial wall thickening. 3. Surgical changes of prior cholecystectomy and L1-L2 posterior lumbar interbody fusion with left L2 laminectomy. 4. Epidural spinal stimulator as described above.  Signed,  Criselda Peaches, MD  [facility]  [facility]   Electronically Signed   By: Jacqulynn Cadet [name]   On: 08/26/2014 14:58     EKG Interpretation   Date/Time:  Tuesday Aug 26 2014 13:02:12 EDT Ventricular Rate:  64 PR Interval:  196 QRS Duration: 109 QT Interval:  423 QTC Calculation: 436 R Axis:   9 Text Interpretation:  Sinus rhythm Confirmed by Haidyn Chadderdon  MD, Eleisha Branscomb ([id number])  on 08/26/2014 1:49:30 PM      MDM   Final diagnoses:  Upper abdominal pain   Patient presents with upper abdominal pain rating to the back. CT angiogram results reviewed no acute dissection or aneurysm. Patient improved in the ER. Patient is stable to follow-up with primary doctor.  Pt denies cough, no fevers.  Results and differential diagnosis were discussed with the patient/parent/guardian. Close follow up outpatient was discussed, comfortable with the plan.   Medications  morphine 4 MG/ML injection 4 mg (4 mg Intravenous Given 08/26/14 1305)  0.9 %  sodium chloride infusion ( Intravenous Stopped 08/26/14 1529)  iohexol (OMNIPAQUE) 350 MG/ML injection 100 mL (100 mLs Intravenous Contrast Given 08/26/14 1408)   morphine 4 MG/ML injection 4 mg (4 mg Intravenous Given 08/26/14 1511)    Filed Vitals:   08/26/14 1330 08/26/14 1400 08/26/14 1430 08/26/14 1536  BP: 117/63 123/66 124/64 130/59  Pulse: 70 60 60 61  Temp:    97.8 F (36.6 C)  TempSrc:      Resp: 23 19 17 20   Height:      Weight:  SpO2: 95% 95% 96% 96%    Final diagnoses:  Upper abdominal pain        Elnora Morrison, MD 09/12/14 1555

## 2014-09-15 ENCOUNTER — Encounter: Payer: Medicare HMO | Admitting: Internal Medicine

## 2014-09-24 ENCOUNTER — Encounter: Payer: Self-pay | Admitting: Internal Medicine

## 2014-10-06 ENCOUNTER — Other Ambulatory Visit: Payer: Self-pay

## 2014-10-07 ENCOUNTER — Other Ambulatory Visit: Payer: Self-pay

## 2014-10-10 ENCOUNTER — Other Ambulatory Visit: Payer: Self-pay

## 2014-10-10 ENCOUNTER — Encounter: Payer: Self-pay | Admitting: Gastroenterology

## 2014-10-10 ENCOUNTER — Ambulatory Visit (INDEPENDENT_AMBULATORY_CARE_PROVIDER_SITE_OTHER): Payer: Medicare HMO | Admitting: Gastroenterology

## 2014-10-10 VITALS — BP 119/64 | HR 68 | Temp 97.0°F | Ht 70.0 in | Wt 238.0 lb

## 2014-10-10 DIAGNOSIS — K7469 Other cirrhosis of liver: Secondary | ICD-10-CM

## 2014-10-10 MED ORDER — FLECAINIDE ACETATE 100 MG PO TABS: 100.0000 mg | ORAL_TABLET | Freq: Two times a day (BID) | ORAL | Status: DC

## 2014-10-10 MED ORDER — NADOLOL 40 MG PO TABS: ORAL_TABLET | ORAL | Status: DC

## 2014-10-10 NOTE — Telephone Encounter (Signed)
Per Dr Harl Bowie note 2.10.16

## 2014-10-10 NOTE — Patient Instructions (Signed)
I would like you to return in 3 months.   Let's continue Nadolol 40 mg in the morning and add 20 mg in the evening. If you feel dizzy, light-headed, note your blood pressure dropping, please stop the second dose in the evening and call us.

## 2014-10-10 NOTE — Progress Notes (Signed)
Referring Provider: Sharilyn Sites, MD Primary Care Physician:  Purvis Kilts, MD  Primary GI: Dr. Gala Romney   Chief Complaint  Patient presents with  . Follow-up    HPI:   Ralph [name] is a 64 y.o. male presenting today with a history of NASH cirrhosis, GERD, probable gastroparesis. Here for follow-up after EGD and colonoscopy. Tubular adenoma on colonoscopy and due for surveillance in 7 years. EGD with mild erosive reflux esophagitis, Grade 1 esophageal varices, patent esophagus, no dilation. Referred to speech due to symptoms.    MBSS: moderate pharyngeal phase dysphagia characterized by decreased tongue base retraction and epiglottic deflection with overall decreased pharyngeal pressure. Will do exercises per speech.   Last imaging of liver May 2016. Due for imaging again in Nov 2016.   CTA abd/chest with widely patent celiac, SMA, and IMA. Abdominal pain better, still with some tenderness upper abdomen. States sometimes worse after eating but usually just present "all the time". Rare nausea, no vomiting. Zofran as needed. Protonix once a day. Appetite is good. States sometimes can't think of what he is doing, what he's going to say, symptoms for 6-8 months. Lost coordination and strength in left hand. Scheduled to see cardiology mid July. States started losing weight when starting Lexapro. Was holding in the 250s until Feb. Believes he started Lexapro around this time. Now 238.   Past Medical History  Diagnosis Date  . Hypertension   . Cirrhosis     NASH-Hep A and B immune  . Depressed   . Varicose vein     of esophagus  . Difficult intubation     "trouble waking up afterwards" (01/06/2012)  . CHF (congestive heart failure) 01/06/2012  . Sinus pause 01/06/2012    5.2 seconds  . Anginal pain   . Sleep apnea     "don't wear mask" (01/06/2012)  . Emphysema   . Type II diabetes mellitus   . GERD (gastroesophageal reflux disease)   . H/O hiatal hernia   . Coughing up  blood     "comes from my throat" (01/06/2012)  . Arthritis     "back; fingers" (01/06/2012)  . Chronic lower back pain   . Fatty liver disease, nonalcoholic   . CHB (complete heart block)   . Presence of permanent cardiac pacemaker 9/292013    St.Jude  . Atrial flutter     s/p EPS +RF ablation of typical atrial flutter April 2015  . Pacemaker     Past Surgical History  Procedure Laterality Date  . Back surgery    . Spinal cord stimulator implant  2006  . Cholecystectomy  1993  . Nasal septum surgery  1992  . Tonsillectomy and adenoidectomy  1992  . Posterior fusion lumbar spine  1999    L4-5  . Lumbar disc surgery  1994; ~ 1995; ~ 1996  . Warthin's tumor excision  1990's    right  . Permanent pacemaker insertion  01/08/2012    CHB  . US echocardiography  12/28/2011    mild LVH,mild mitral annulara ca+,mild MR  . Nuclear stress test  10/19/2004    No ischemia  . Esophagogastroduodenoscopy  11/08/2004    DVV:OHYWVP esophageal erosions consistent with erosive reflux esophagitis/Areas of hemorrhage and nodularity of the fundal mucosa of uncertain significance, biopsied.  Small hiatal hernia, otherwise normal stomach  . Colonoscopy  11/08/2004    XTG:GYIRSW rectum, colon, TI.  Marland Kitchen Esophagogastroduodenoscopy  2010    Dr. Gala Romney:  3 columns Grade 1 varices, erosive esophagitis, HH, portal gastropathy, normal D1, D2  . Esophagogastroduodenoscopy (egd) with esophageal dilation N/A 02/14/2013    DGU:YQIHK 1 esophageal varices. Abnormal distal esophagus/status post biopsy after Maloney dilation. Portal gastropathy. Antral erosions-status post biopsy. path negative for H.pylori, benign path.  . Permanent pacemaker insertion N/A 01/09/2012    Procedure: PERMANENT PACEMAKER INSERTION;  Surgeon: Sanda Klein, MD;  Location: [facility] CATH LAB;  Service: Cardiovascular;  Laterality: N/A;  . Atrial flutter ablation N/A 07/10/2013    Procedure: ATRIAL FLUTTER ABLATION;  Surgeon: Evans Lance, MD;  Location:  [facility] CATH LAB;  Service: Cardiovascular;  Laterality: N/A;  . Colonoscopy N/A 05/28/2014    Dr. Gala Romney: Redundant colon. single colonic polyp removed as described above. Tubular adenoma  . Esophagogastroduodenoscopy N/A 05/28/2014    Dr. Gala Romney: MIld erosive reflux esophagitis. Grade 1 esophageal varices. Patent esophagus. No dilation performed. Hiatal hernia.   . Esophageal dilation N/A 05/28/2014    Procedure: ESOPHAGEAL DILATION;  Surgeon: Daneil Dolin, MD;  Location: [facility];  Service: Endoscopy;  Laterality: N/A;    Current Outpatient Prescriptions  Medication Sig Dispense Refill  . albuterol (PROVENTIL HFA;VENTOLIN HFA) 108 (90 BASE) MCG/ACT inhaler Inhale 2 puffs into the lungs every 6 (six) hours as needed for wheezing.    Marland Kitchen buPROPion (WELLBUTRIN SR) 150 MG 12 hr tablet Take 150 mg by mouth 2 (two) times daily.    . Canagliflozin (INVOKANA) 300 MG TABS Take 300 mg by mouth daily.     . Cholecalciferol (VITAMIN D PO) Take 1 tablet by mouth daily.    . diazepam (VALIUM) 10 MG tablet Take 10 mg by mouth every 6 (six) hours as needed for anxiety.    Marland Kitchen diltiazem (CARDIZEM CD) 240 MG 24 hr capsule Take 1 capsule (240 mg total) by mouth every morning. 90 capsule 3  . escitalopram (LEXAPRO) 10 MG tablet Take 10 mg by mouth daily.    . flecainide (TAMBOCOR) 100 MG tablet TAKE 1 TABLET BY MOUTH TWICE A DAY 90 tablet 1  . furosemide (LASIX) 20 MG tablet Take 2 tablets (40 mg total) by mouth every morning. 60 tablet 3  . insulin aspart (NOVOLOG) 100 UNIT/ML injection Inject 5-10 Units into the skin 3 (three) times daily before meals.     Marland Kitchen LEVEMIR 100 UNIT/ML injection Inject 70 Units into the skin every evening.  0  . levothyroxine (SYNTHROID, LEVOTHROID) 88 MCG tablet Take 100 mcg by mouth daily before breakfast.     . metFORMIN (GLUCOPHAGE) 1000 MG tablet Take 1,000 mg by mouth 2 (two) times daily with a meal.    . mometasone-formoterol (DULERA) 100-5 MCG/ACT AERO Inhale 2 puffs into the  lungs 2 (two) times daily.    . Multiple Vitamin (MULTIVITAMIN) tablet Take 1 tablet by mouth daily.    . nadolol (CORGARD) 40 MG tablet Take 1 tablet (40 mg total) by mouth daily. 90 tablet 3  . NON FORMULARY Anora Inhaler   Once in the AM    . NON FORMULARY Oxygen 2L    24/7    . ondansetron (ZOFRAN) 4 MG tablet Take 1 tablet (4 mg total) by mouth every 8 (eight) hours as needed for nausea or vomiting. 40 tablet 0  . pantoprazole (PROTONIX) 40 MG tablet Take 40 mg by mouth every morning.    . rivaroxaban (XARELTO) 20 MG TABS tablet Take 1 tablet (20 mg total) by mouth daily with supper. 90 tablet 3  .  simvastatin (ZOCOR) 20 MG tablet Take 1 tablet (20 mg total) by mouth every evening. 90 tablet 3  . spironolactone (ALDACTONE) 50 MG tablet Take 1 tablet (50 mg total) by mouth 2 (two) times daily. 180 tablet 3  . traMADol (ULTRAM) 50 MG tablet Take 1 tablet (50 mg total) by mouth every 6 (six) hours as needed. 20 tablet 0  . Umeclidinium-Vilanterol (ANORO ELLIPTA) 62.5-25 MCG/INH AEPB 1 puff once daily 3 each 6  . HYDROcodone-acetaminophen (NORCO) 10-325 MG per tablet Take 1 tablet by mouth every 6 (six) hours as needed for pain.    Marland Kitchen ondansetron (ZOFRAN) 4 MG tablet Take 1 tablet (4 mg total) by mouth every 8 (eight) hours as needed for nausea or vomiting. (Patient not taking: Reported on 08/26/2014) 30 tablet 5  . polyethylene glycol-electrolytes (NULYTELY/GOLYTELY) 420 G solution Take 4,000 mLs by mouth once. (Patient not taking: Reported on 08/26/2014) 4000 mL 0   No current facility-administered medications for this visit.    Allergies as of 10/10/2014 - Review Complete 10/10/2014  Allergen Reaction Noted  . Nitroglycerin Hives, Swelling, and Rash     Family History  Problem Relation Age of Onset  . Stroke Brother   . Cancer Mother   . Arrhythmia Father     History   Social History  . Marital Status: Married    Spouse Name: N/A  . Number of Children: N/A  . Years of Education:  N/A   Social History Main Topics  . Smoking status: Former Smoker -- 3.00 packs/day for 45 years    Types: Cigarettes    Start date: 04/11/1968    Quit date: 03/11/2013  . Smokeless tobacco: Former Systems developer    Quit date: 11/13/2012     Comment: Quit x 8 months this time  . Alcohol Use: No     Comment: "quit alcohol 2011"  . Drug Use: No  . Sexual Activity: Not Currently   Other Topics Concern  . None   Social History Narrative    Review of Systems: As mentioned in HPI   Physical Exam: BP 119/64 mmHg  Pulse 68  Temp(Src) 97 F (36.1 C) (Oral)  Ht 5\' 10"  (1.778 m)  Wt 238 lb (107.956 kg)  BMI 34.15 kg/m2 General:   Alert and oriented. No distress noted. Pleasant and cooperative.  Head:  Normocephalic and atraumatic. Eyes:  Conjuctiva clear without scleral icterus. Abdomen:  +BS, soft, non-tender and non-distended. No rebound or guarding. No HSM or masses noted. Extremities:  Without edema. Neurologic:  Alert and  oriented x4;  grossly normal neurologically. Psych:  Alert and cooperative. Normal mood and affect.

## 2014-10-15 ENCOUNTER — Telehealth: Payer: Self-pay | Admitting: Internal Medicine

## 2014-10-15 NOTE — Telephone Encounter (Signed)
Pt is on the August recall list to have a 6 months U/S

## 2014-10-15 NOTE — Telephone Encounter (Signed)
Letter mailed for him to call and set up [place]

## 2014-10-17 ENCOUNTER — Encounter: Payer: Self-pay | Admitting: Gastroenterology

## 2014-10-17 NOTE — Assessment & Plan Note (Signed)
63 year old male with history of NASH cirrhosis, due for next imaging again in Nov 2016. Vague upper abdominal pain chronic, improved overall since last visit. CTA abd with widely patent mesenteric vasculature and EGD up-to-date. Continue Protonix daily. Continue Nadolol 40 mg and add small dose of 20 mg in evening due to HR still in mid to upper 60s. Weight loss noted but likely multifactorial secondary to diet, medication change.  Return in 3 months for close follow-up.

## 2014-10-20 ENCOUNTER — Other Ambulatory Visit: Payer: Self-pay | Admitting: Gastroenterology

## 2014-10-20 MED ORDER — PANTOPRAZOLE SODIUM 40 MG PO TBEC: 40.0000 mg | DELAYED_RELEASE_TABLET | Freq: Every day | ORAL | Status: DC

## 2014-10-21 ENCOUNTER — Telehealth: Payer: Self-pay

## 2014-10-21 NOTE — Telephone Encounter (Signed)
Received a notice in the chart that the patients pantoprazole, that was sent in yesterday, did not go through to the pharmacy.  I have called in rx to [facility].

## 2014-10-21 NOTE — Progress Notes (Signed)
cc'ed to pcp °

## 2014-10-21 NOTE — Telephone Encounter (Signed)
Thanks

## 2014-10-21 NOTE — Progress Notes (Signed)
rx failed to go to the pharmacy. Called in rx to [facility]/ [place].

## 2014-10-27 ENCOUNTER — Ambulatory Visit (INDEPENDENT_AMBULATORY_CARE_PROVIDER_SITE_OTHER): Payer: Medicare HMO | Admitting: Internal Medicine

## 2014-10-27 ENCOUNTER — Encounter: Payer: Self-pay | Admitting: Internal Medicine

## 2014-10-27 VITALS — BP 124/72 | HR 82 | Ht 70.0 in | Wt 232.0 lb

## 2014-10-27 DIAGNOSIS — J410 Simple chronic bronchitis: Secondary | ICD-10-CM | POA: Diagnosis not present

## 2014-10-27 DIAGNOSIS — I5031 Acute diastolic (congestive) heart failure: Secondary | ICD-10-CM | POA: Diagnosis not present

## 2014-10-27 DIAGNOSIS — I480 Paroxysmal atrial fibrillation: Secondary | ICD-10-CM | POA: Diagnosis not present

## 2014-10-27 DIAGNOSIS — I455 Other specified heart block: Secondary | ICD-10-CM | POA: Diagnosis not present

## 2014-10-27 DIAGNOSIS — Z950 Presence of cardiac pacemaker: Secondary | ICD-10-CM

## 2014-10-27 DIAGNOSIS — Z79899 Other long term (current) drug therapy: Secondary | ICD-10-CM | POA: Diagnosis not present

## 2014-10-27 LAB — CUP PACEART INCLINIC DEVICE CHECK
Battery Remaining Longevity: 96 mo
Battery Voltage: 2.93 V
Brady Statistic RA Percent Paced: 91 %
Date Time Interrogation Session: 20160718102318
Lead Channel Impedance Value: 400 Ohm
Lead Channel Pacing Threshold Amplitude: 0.75 V
Lead Channel Pacing Threshold Amplitude: 1 V
Lead Channel Pacing Threshold Pulse Width: 0.4 ms
Lead Channel Pacing Threshold Pulse Width: 0.4 ms
Lead Channel Sensing Intrinsic Amplitude: 2.8 mV
Lead Channel Sensing Intrinsic Amplitude: 3.4 mV
Lead Channel Setting Pacing Amplitude: 2 V
Lead Channel Setting Pacing Amplitude: 2.5 V
Lead Channel Setting Pacing Pulse Width: 0.4 ms
Lead Channel Setting Sensing Sensitivity: 1 mV
MDC IDC MSMT LEADCHNL RV IMPEDANCE VALUE: 412.5 Ohm
MDC IDC STAT BRADY RV PERCENT PACED: 0.41 %
Pulse Gen Model: 2210
Pulse Gen Serial Number: 7393982

## 2014-10-27 NOTE — Assessment & Plan Note (Signed)
He is maintaining NSR 98% of the time. He will continue his current meds.

## 2014-10-27 NOTE — Assessment & Plan Note (Signed)
He is now using oxygen around the clock. He will continue his bronchodilators.

## 2014-10-27 NOTE — Assessment & Plan Note (Signed)
He is currently class 2. He will continue his current meds. I have encouraged the patient to maintain a low sodium diet.

## 2014-10-27 NOTE — Progress Notes (Signed)
HPI  Ralph [name] returns today for followup. He denies chest pain or palpitations. He has chronic class 2 sob which is multifactorial. He has had no syncope. Minimal peripheral edema. He admits to dietary indiscretion. He has lost weight though he states he is not really trying to do so. No syncope. No palpitations.  Allergies  Allergen Reactions  . Nitroglycerin Hives, Swelling and Rash     Current Outpatient Prescriptions  Medication Sig Dispense Refill  . albuterol (PROVENTIL HFA;VENTOLIN HFA) 108 (90 BASE) MCG/ACT inhaler Inhale 2 puffs into the lungs every 6 (six) hours as needed for wheezing.    Marland Kitchen buPROPion (WELLBUTRIN SR) 150 MG 12 hr tablet Take 150 mg by mouth 2 (two) times daily.    . Canagliflozin (INVOKANA) 300 MG TABS Take 300 mg by mouth daily.     . Cholecalciferol (VITAMIN D PO) Take 1 tablet by mouth daily.    . diazepam (VALIUM) 10 MG tablet Take 10 mg by mouth every 6 (six) hours as needed for anxiety.    Marland Kitchen diltiazem (CARDIZEM CD) 240 MG 24 hr capsule Take 1 capsule (240 mg total) by mouth every morning. 90 capsule 3  . escitalopram (LEXAPRO) 10 MG tablet Take 10 mg by mouth daily.    . flecainide (TAMBOCOR) 100 MG tablet Take 1 tablet (100 mg total) by mouth 2 (two) times daily. 180 tablet 1  . furosemide (LASIX) 20 MG tablet Take 2 tablets (40 mg total) by mouth every morning. 60 tablet 3  . HYDROcodone-acetaminophen (NORCO) 10-325 MG per tablet Take 1 tablet by mouth every 6 (six) hours as needed for pain.    Marland Kitchen insulin aspart (NOVOLOG) 100 UNIT/ML injection Inject 5-10 Units into the skin 3 (three) times daily before meals.     Marland Kitchen LEVEMIR 100 UNIT/ML injection Inject 70 Units into the skin every evening.  0  . levothyroxine (SYNTHROID, LEVOTHROID) 88 MCG tablet Take 100 mcg by mouth daily before breakfast.     . metFORMIN (GLUCOPHAGE) 1000 MG tablet Take 1,000 mg by mouth 2 (two) times daily with a meal.    . mometasone-formoterol (DULERA) 100-5 MCG/ACT AERO  Inhale 2 puffs into the lungs 2 (two) times daily.    . Multiple Vitamin (MULTIVITAMIN) tablet Take 1 tablet by mouth daily.    . nadolol (CORGARD) 40 MG tablet Take 1 tablet in the morning and 1/2 tablet in the evening (40 mg in the morning and 20 mg in the evening). 60 tablet 3  . NON FORMULARY Anora Inhaler   Once in the AM    . NON FORMULARY Oxygen 2L    24/7    . ondansetron (ZOFRAN) 4 MG tablet Take 1 tablet (4 mg total) by mouth every 8 (eight) hours as needed for nausea or vomiting. 40 tablet 0  . pantoprazole (PROTONIX) 40 MG tablet Take 1 tablet (40 mg total) by mouth daily. 90 tablet 5  . rivaroxaban (XARELTO) 20 MG TABS tablet Take 1 tablet (20 mg total) by mouth daily with supper. 90 tablet 3  . simvastatin (ZOCOR) 20 MG tablet Take 1 tablet (20 mg total) by mouth every evening. 90 tablet 3  . spironolactone (ALDACTONE) 50 MG tablet Take 1 tablet (50 mg total) by mouth 2 (two) times daily. 180 tablet 3  . traMADol (ULTRAM) 50 MG tablet Take 1 tablet (50 mg total) by mouth every 6 (six) hours as needed. 20 tablet 0  . Umeclidinium-Vilanterol (ANORO ELLIPTA) 62.5-25 MCG/INH  AEPB 1 puff once daily 3 each 6   No current facility-administered medications for this visit.     Past Medical History  Diagnosis Date  . Hypertension   . Cirrhosis     NASH-Hep A and B immune  . Depressed   . Varicose vein     of esophagus  . Difficult intubation     "trouble waking up afterwards" (01/06/2012)  . CHF (congestive heart failure) 01/06/2012  . Sinus pause 01/06/2012    5.2 seconds  . Anginal pain   . Sleep apnea     "don't wear mask" (01/06/2012)  . Emphysema   . Type II diabetes mellitus   . GERD (gastroesophageal reflux disease)   . H/O hiatal hernia   . Coughing up blood     "comes from my throat" (01/06/2012)  . Arthritis     "back; fingers" (01/06/2012)  . Chronic lower back pain   . Fatty liver disease, nonalcoholic   . CHB (complete heart block)   . Presence of permanent  cardiac pacemaker 9/292013    St.Jude  . Atrial flutter     s/p EPS +RF ablation of typical atrial flutter April 2015  . Pacemaker     ROS:   All systems reviewed and negative except as noted in the HPI.   Past Surgical History  Procedure Laterality Date  . Back surgery    . Spinal cord stimulator implant  2006  . Cholecystectomy  1993  . Nasal septum surgery  1992  . Tonsillectomy and adenoidectomy  1992  . Posterior fusion lumbar spine  1999    L4-5  . Lumbar disc surgery  1994; ~ 1995; ~ 1996  . Warthin's tumor excision  1990's    right  . Permanent pacemaker insertion  01/08/2012    CHB  . US echocardiography  12/28/2011    mild LVH,mild mitral annulara ca+,mild MR  . Nuclear stress test  10/19/2004    No ischemia  . Esophagogastroduodenoscopy  11/08/2004    HYW:VPXTGG esophageal erosions consistent with erosive reflux esophagitis/Areas of hemorrhage and nodularity of the fundal mucosa of uncertain significance, biopsied.  Small hiatal hernia, otherwise normal stomach  . Colonoscopy  11/08/2004    YIR:SWNIOE rectum, colon, TI.  Marland Kitchen Esophagogastroduodenoscopy  2010    Dr. Gala Romney: 3 columns Grade 1 varices, erosive esophagitis, HH, portal gastropathy, normal D1, D2  . Esophagogastroduodenoscopy (egd) with esophageal dilation N/A 02/14/2013    VOJ:JKKXF 1 esophageal varices. Abnormal distal esophagus/status post biopsy after Maloney dilation. Portal gastropathy. Antral erosions-status post biopsy. path negative for H.pylori, benign path.  . Permanent pacemaker insertion N/A 01/09/2012    Procedure: PERMANENT PACEMAKER INSERTION;  Surgeon: Sanda Klein, MD;  Location: [facility] CATH LAB;  Service: Cardiovascular;  Laterality: N/A;  . Atrial flutter ablation N/A 07/10/2013    Procedure: ATRIAL FLUTTER ABLATION;  Surgeon: Evans Lance, MD;  Location: [facility] CATH LAB;  Service: Cardiovascular;  Laterality: N/A;  . Colonoscopy N/A 05/28/2014    Dr. Gala Romney: Redundant colon. single colonic polyp  removed as described above. Tubular adenoma  . Esophagogastroduodenoscopy N/A 05/28/2014    Dr. Gala Romney: MIld erosive reflux esophagitis. Grade 1 esophageal varices. Patent esophagus. No dilation performed. Hiatal hernia.   . Esophageal dilation N/A 05/28/2014    Procedure: ESOPHAGEAL DILATION;  Surgeon: Daneil Dolin, MD;  Location: [facility];  Service: Endoscopy;  Laterality: N/A;     Family History  Problem Relation Age of Onset  . Stroke Brother   .  Cancer Mother   . Arrhythmia Father      History   Social History  . Marital Status: Married    Spouse Name: N/A  . Number of Children: N/A  . Years of Education: N/A   Occupational History  . Not on file.   Social History Main Topics  . Smoking status: Former Smoker -- 3.00 packs/day for 45 years    Types: Cigarettes    Start date: 04/11/1968    Quit date: 03/11/2013  . Smokeless tobacco: Former Systems developer    Quit date: 11/13/2012     Comment: Quit x 8 months this time  . Alcohol Use: No     Comment: "quit alcohol 2011"  . Drug Use: No  . Sexual Activity: Not Currently   Other Topics Concern  . Not on file   Social History Narrative     Pulse 82  Ht 5\' 10"  (1.778 m)  Wt 232 lb (105.235 kg)  BMI 33.29 kg/m2  SpO2 91%  Physical Exam:  obese appearing middle aged man, NAD HEENT: Unremarkable except wearing [place] by face mask. Neck:  No JVD, no thyromegally Back:  No CVA tenderness Lungs:  Clear with no wheezes HEART:  Regular rate rhythm, no murmurs, no rubs, no clicks Abd:  soft, positive bowel sounds, no organomegally, no rebound, no guarding Ext:  2 plus pulses, no edema, no cyanosis, no clubbing Skin:  No rashes no nodules Neuro:  CN II through XII intact, motor grossly intact   DEVICE  Normal device function.  See PaceArt for details.   Assess/Plan:

## 2014-10-27 NOTE — Assessment & Plan Note (Signed)
His [employer] DDD PPM is working normally. Will recheck in several months.

## 2014-10-27 NOTE — Patient Instructions (Signed)
Your physician wants you to follow-up in: 1 year with Dr Knox Saliva will receive a reminder letter in the mail two months in advance. If you don't receive a letter, please call our office to schedule the follow-up appointment.   Remote monitoring is used to monitor your Pacemaker of ICD from home. This monitoring reduces the number of office visits required to check your device to one time per year. It allows [place] to keep an eye on the functioning of your device to ensure it is working properly. You are scheduled for a device check from home on 01/26/15. You may send your transmission at any time that day. If you have a wireless device, the transmission will be sent automatically. After your physician reviews your transmission, you will receive a postcard with your next transmission date.        Thank you for choosing [facility] !

## 2014-11-02 ENCOUNTER — Encounter: Payer: Self-pay | Admitting: Internal Medicine

## 2014-11-03 ENCOUNTER — Encounter: Payer: Self-pay | Admitting: Gastroenterology

## 2014-11-10 ENCOUNTER — Encounter: Payer: Self-pay | Admitting: Internal Medicine

## 2014-11-10 DIAGNOSIS — J189 Pneumonia, unspecified organism: Secondary | ICD-10-CM

## 2014-11-10 HISTORY — DX: Pneumonia, unspecified organism: J18.9

## 2014-12-01 ENCOUNTER — Other Ambulatory Visit: Payer: Self-pay

## 2014-12-01 MED ORDER — NADOLOL 40 MG PO TABS: ORAL_TABLET | ORAL | Status: DC

## 2014-12-23 ENCOUNTER — Ambulatory Visit (INDEPENDENT_AMBULATORY_CARE_PROVIDER_SITE_OTHER): Payer: Medicare HMO | Admitting: Internal Medicine

## 2014-12-23 ENCOUNTER — Encounter: Payer: Self-pay | Admitting: Emergency Medicine

## 2014-12-23 ENCOUNTER — Encounter: Payer: Self-pay | Admitting: Internal Medicine

## 2014-12-23 ENCOUNTER — Ambulatory Visit (INDEPENDENT_AMBULATORY_CARE_PROVIDER_SITE_OTHER): Payer: Medicare HMO | Admitting: Emergency Medicine

## 2014-12-23 VITALS — BP 110/62 | HR 60 | Ht 70.0 in | Wt 228.0 lb

## 2014-12-23 VITALS — BP 129/79 | HR 93 | Temp 97.2°F | Ht 70.0 in | Wt 229.8 lb

## 2014-12-23 DIAGNOSIS — Z23 Encounter for immunization: Secondary | ICD-10-CM | POA: Diagnosis not present

## 2014-12-23 DIAGNOSIS — K219 Gastro-esophageal reflux disease without esophagitis: Secondary | ICD-10-CM | POA: Diagnosis not present

## 2014-12-23 DIAGNOSIS — J410 Simple chronic bronchitis: Secondary | ICD-10-CM | POA: Diagnosis not present

## 2014-12-23 DIAGNOSIS — K7460 Unspecified cirrhosis of liver: Secondary | ICD-10-CM | POA: Diagnosis not present

## 2014-12-23 DIAGNOSIS — G4733 Obstructive sleep apnea (adult) (pediatric): Secondary | ICD-10-CM

## 2014-12-23 NOTE — Progress Notes (Signed)
Subjective:    Patient ID: Ralph [name], male    DOB: 02-21-1951, 64 y.o.   MRN: [id number]  HPI 64 yo man, former smoker (130pk-yrs), HTN, cirrhosis w varices, OSA not on CPAP, DM, A flutter s/p ablation and pacer, hiatal hernia. He has dysphagia. Has been dx with COPD in 2014 by Dr Luan Pulling. Wears O2 at 2L/min since Summer 2014. He is on Dulera + ProAir prn. He isn't sure they help him. He was once on Spiriva, didn't feels it helped. He has dyspnea that can happen at rest or with exertion. He has nasal congestion and allergies.    PFT 04/2011 > suggests mixed disease, moderate AFL, no BD response, normal volumes, decreased DLCO.   ROV 10/15/13 -- hx COPD, OSA, restrictive dz. Last time we tried changing dulera to anoro, there was not a big difference with the new medication. He uses albuterol 2-3 times a day. He is still having apneic episodes. Waking up in resp distress.  He uses O2 with exertion, wants a portable concentrator. He has a new psoriatic lesion on L knee.   ROV 12/19/13 -- follow up visit for COPD, OSA, restrictive dz.  He underwent PSG, final read not yet available but suspect negative for OSA.  Has daily cough, green mucous. Has good days and bad days. No real wheeze. He uses albuterol about 2 x a day. Dermatology evaluated his psoriasis > steroid cream.   ROV 12/23/14 -- follow-up visit for COPD and restrictive lung disease. He underwent a polysomnogram 12/21/13 that did not show any evidence for obstructive or central sleep apnea. He was maintained on 2 L/m of oxygen without desaturation. He has undergone ablation since our last visit, has made him feel much better. He is on Anoro daily, tolerates it well. Uses albuterol very rarely. He was given abx and pred this summer by Dr Hilma Favors.    Review of Systems  Constitutional: Negative for fever and unexpected weight change.  HENT: Positive for congestion, postnasal drip, sinus pressure and trouble swallowing. Negative for dental  problem, ear pain, nosebleeds, rhinorrhea, sneezing and sore throat.   Eyes: Negative for redness and itching.  Respiratory: Positive for shortness of breath. Negative for cough, chest tightness and wheezing.   Cardiovascular: Positive for chest pain, palpitations and leg swelling.       Hand and feet  Gastrointestinal: Negative for nausea and vomiting.  Genitourinary: Negative for dysuria.  Musculoskeletal: Negative for joint swelling.  Skin: Negative for rash.  Neurological: Negative for headaches.  Hematological: Does not bruise/bleed easily.  Psychiatric/Behavioral: Positive for dysphoric mood. The patient is nervous/anxious.        Objective:   Physical Exam Filed Vitals:   12/23/14 1518  BP: 110/62  Pulse: 60  Height: 5\' 10"  (1.778 m)  Weight: 228 lb (103.42 kg)  SpO2: 93%   Gen: Pleasant, obese, in no distress,  normal affect on O2  ENT: No lesions,  mouth clear,  oropharynx clear, no postnasal drip  Neck: No JVD, no TMG, no carotid bruits  Lungs: No use of accessory muscles, clear without rales or rhonchi  Cardiovascular: RRR, heart sounds normal, no murmur or gallops, trace peripheral edema  Musculoskeletal: No deformities, no cyanosis or clubbing  Neuro: alert, non focal  Skin: Warm, no lesions or rashes    11/21/12 --  Study Conclusions - Left ventricle: The cavity size was normal. Wall thickness was increased in a pattern of mild LVH. There was moderate asymmetric hypertrophy of  the septum. Systolic function was normal. The estimated ejection fraction was in the range of 60% to 65%. Wall motion was normal; there were no regional wall motion abnormalities. The study is not technically sufficient to allow evaluation of LV diastolic function. - Mitral valve: Trivial regurgitation. - Left atrium: The atrium was mildly dilated. - Right ventricle: The cavity size was mildly to moderately dilated. Pacer wire or catheter noted in right ventricle. Systolic  function was normal. - Right atrium: Central venous pressure: 24mm Hg (est). - Tricuspid valve: Trivial regurgitation. - Pulmonary arteries: PA peak pressure: 4mm Hg (S). - Pericardium, extracardiac: There was no pericardial effusion. Impressions:  - No prior study available for comparison. Mild LVH with moderate septal hypertrophy, LVEF 60-65%. indeterminate diastolic function. Mild left atrial enlargement. Mild to moderate RV enlargement, device wire noted. Normal PASP and CVP. No pericardial effusion.     Assessment & Plan:  COPD (chronic obstructive pulmonary disease) Gold stage C COPD with associated exertional hypoxemia. Stable at this time on Anoro although he did have an acute exacerbation this summer for which she was treated with antibiotics and steroids. At this time we will continue his Anoro, oxygen with exertion and while sleeping. Flu shot today  Obstructive sleep apnea His sleep study did not show any evidence for obstructive sleep apnea or central sleep apnea. It did confirm nocturnal hypoxemia that was well treated on 2 L/m

## 2014-12-23 NOTE — Patient Instructions (Signed)
Continue with good glycemic control  Regular exercise  Regular coffee consumption may help your liver  Liver ultrasound in November  Continue Protonix and Nadolol at the current dose  Office visit in 6 months

## 2014-12-23 NOTE — Patient Instructions (Signed)
Continue to sleep with your oxygen at night and with exertion.  Continue your Anoro daily Take albuterol 2 puffs up to every 4 hours if needed for shortness of breath.  Follow with Dr Lamonte Sakai in 6 months or sooner if you have any problems

## 2014-12-23 NOTE — Assessment & Plan Note (Signed)
His sleep study did not show any evidence for obstructive sleep apnea or central sleep apnea. It did confirm nocturnal hypoxemia that was well treated on 2 L/m

## 2014-12-23 NOTE — Progress Notes (Addendum)
Primary Care Physician:  Purvis Kilts, MD Primary Gastroenterologist:  Dr. Gala Romney  Pre-Procedure History & Physical: HPI:  Ralph [name] is a 64 y.o. male here for follow-up of Karlene Lineman cirrhosis. Nadolol increased at last visit. Heart rate hovered around 60. This is at the  threshold for his pacemaker activation. Is not having any dizziness or presyncopal episodes. He tried to get some regular exercise. States his last hemoglobin A1c was 6 range. He does drink 2 cups of coffee daily.  Screening ultrasound due in November of this year.  He remains anticoagulated on Xarelto.  His weight is down 9 pounds since last seen here.  GERD symptoms well controlled on Protonix  Past Medical History  Diagnosis Date  . Hypertension   . Cirrhosis     NASH-Hep A and B immune  . Depressed   . Varicose vein     of esophagus  . Difficult intubation     "trouble waking up afterwards" (01/06/2012)  . CHF (congestive heart failure) 01/06/2012  . Sinus pause 01/06/2012    5.2 seconds  . Anginal pain   . Sleep apnea     "don't wear mask" (01/06/2012)  . Emphysema   . Type II diabetes mellitus   . GERD (gastroesophageal reflux disease)   . H/O hiatal hernia   . Coughing up blood     "comes from my throat" (01/06/2012)  . Arthritis     "back; fingers" (01/06/2012)  . Chronic lower back pain   . Fatty liver disease, nonalcoholic   . CHB (complete heart block)   . Presence of permanent cardiac pacemaker 9/292013    [employer]  . Atrial flutter     s/p EPS +RF ablation of typical atrial flutter April 2015  . Pacemaker     Past Surgical History  Procedure Laterality Date  . Back surgery    . Spinal cord stimulator implant  2006  . Cholecystectomy  1993  . Nasal septum surgery  1992  . Tonsillectomy and adenoidectomy  1992  . Posterior fusion lumbar spine  1999    L4-5  . Lumbar disc surgery  1994; ~ 1995; ~ 1996  . Warthin's tumor excision  1990's    right  . Permanent pacemaker insertion   01/08/2012    CHB  . US echocardiography  12/28/2011    mild LVH,mild mitral annulara ca+,mild MR  . Nuclear stress test  10/19/2004    No ischemia  . Esophagogastroduodenoscopy  11/08/2004    ULA:GTXMIW esophageal erosions consistent with erosive reflux esophagitis/Areas of hemorrhage and nodularity of the fundal mucosa of uncertain significance, biopsied.  Small hiatal hernia, otherwise normal stomach  . Colonoscopy  11/08/2004    OEH:OZYYQM rectum, colon, TI.  Marland Kitchen Esophagogastroduodenoscopy  2010    Dr. Gala Romney: 3 columns Grade 1 varices, erosive esophagitis, HH, portal gastropathy, normal D1, D2  . Esophagogastroduodenoscopy (egd) with esophageal dilation N/A 02/14/2013    GNO:IBBCW 1 esophageal varices. Abnormal distal esophagus/status post biopsy after Maloney dilation. Portal gastropathy. Antral erosions-status post biopsy. path negative for H.pylori, benign path.  . Permanent pacemaker insertion N/A 01/09/2012    Procedure: PERMANENT PACEMAKER INSERTION;  Surgeon: Sanda Klein, MD;  Location: [facility] CATH [facility];  Service: Cardiovascular;  Laterality: N/A;  . Atrial flutter ablation N/A 07/10/2013    Procedure: ATRIAL FLUTTER ABLATION;  Surgeon: Evans Lance, MD;  Location: [facility] CATH LAB;  Service: Cardiovascular;  Laterality: N/A;  . Colonoscopy N/A 05/28/2014    Dr. Gala Romney:  Redundant colon. single colonic polyp removed as described above. Tubular adenoma  . Esophagogastroduodenoscopy N/A 05/28/2014    Dr. Gala Romney: MIld erosive reflux esophagitis. Grade 1 esophageal varices. Patent esophagus. No dilation performed. Hiatal hernia.   . Esophageal dilation N/A 05/28/2014    Procedure: ESOPHAGEAL DILATION;  Surgeon: Daneil Dolin, MD;  Location: [facility];  Service: Endoscopy;  Laterality: N/A;    Prior to Admission medications   Medication Sig Start Date End Date Taking? Authorizing Provider  albuterol (PROVENTIL HFA;VENTOLIN HFA) 108 (90 BASE) MCG/ACT inhaler Inhale 2 puffs into the lungs every 6  (six) hours as needed for wheezing.   Yes Historical Provider, MD  buPROPion (WELLBUTRIN SR) 150 MG 12 hr tablet Take 150 mg by mouth 2 (two) times daily.   Yes Historical Provider, MD  Canagliflozin (INVOKANA) 300 MG TABS Take 300 mg by mouth daily.    Yes Historical Provider, MD  Cholecalciferol (VITAMIN D PO) Take 1 tablet by mouth daily.   Yes Historical Provider, MD  diazepam (VALIUM) 10 MG tablet Take 10 mg by mouth every 6 (six) hours as needed for anxiety.   Yes Historical Provider, MD  diltiazem (CARDIZEM CD) 240 MG 24 hr capsule Take 1 capsule (240 mg total) by mouth every morning. 05/21/14  Yes Arnoldo Lenis, MD  escitalopram (LEXAPRO) 10 MG tablet Take 10 mg by mouth daily.   Yes Historical Provider, MD  flecainide (TAMBOCOR) 100 MG tablet Take 1 tablet (100 mg total) by mouth 2 (two) times daily. 10/10/14  Yes Arnoldo Lenis, MD  furosemide (LASIX) 20 MG tablet Take 2 tablets (40 mg total) by mouth every morning. 03/10/14  Yes Orvil Feil, NP  HYDROcodone-acetaminophen (NORCO) 10-325 MG per tablet Take 1 tablet by mouth every 6 (six) hours as needed for pain.   Yes Historical Provider, MD  insulin aspart (NOVOLOG) 100 UNIT/ML injection Inject 5-10 Units into the skin 3 (three) times daily before meals.    Yes Historical Provider, MD  LEVEMIR 100 UNIT/ML injection Inject 70 Units into the skin every evening. 07/13/14  Yes Historical Provider, MD  levothyroxine (SYNTHROID, LEVOTHROID) 88 MCG tablet Take 100 mcg by mouth daily before breakfast.    Yes Historical Provider, MD  metFORMIN (GLUCOPHAGE) 1000 MG tablet Take 1,000 mg by mouth 2 (two) times daily with a meal.   Yes Historical Provider, MD  mometasone-formoterol (DULERA) 100-5 MCG/ACT AERO Inhale 2 puffs into the lungs 2 (two) times daily.   Yes Historical Provider, MD  Multiple Vitamin (MULTIVITAMIN) tablet Take 1 tablet by mouth daily.   Yes Historical Provider, MD  nadolol (CORGARD) 40 MG tablet Take 1 tablet in the morning  12/01/14  Yes Orvil Feil, NP  NON FORMULARY Anora Inhaler   Once in the AM   Yes Historical Provider, MD  NON FORMULARY Oxygen 2L    24/7   Yes Historical Provider, MD  ondansetron (ZOFRAN) 4 MG tablet Take 1 tablet (4 mg total) by mouth every 8 (eight) hours as needed for nausea or vomiting. 07/21/14  Yes Mahala Menghini, PA-C  pantoprazole (PROTONIX) 40 MG tablet Take 1 tablet (40 mg total) by mouth daily. 10/20/14  Yes Orvil Feil, NP  rivaroxaban (XARELTO) 20 MG TABS tablet Take 1 tablet (20 mg total) by mouth daily with supper. 06/05/14  Yes Arnoldo Lenis, MD  simvastatin (ZOCOR) 20 MG tablet Take 1 tablet (20 mg total) by mouth every evening. 05/21/14  Yes Arnoldo Lenis,  MD  spironolactone (ALDACTONE) 50 MG tablet Take 1 tablet (50 mg total) by mouth 2 (two) times daily. 06/03/14  Yes Mahala Menghini, PA-C  Umeclidinium-Vilanterol Carbon [facility] ELLIPTA) 62.5-25 MCG/INH AEPB 1 puff once daily 10/15/13  Yes Collene Gobble, MD    Allergies as of 12/23/2014 - Review Complete 12/23/2014  Allergen Reaction Noted  . Nitroglycerin Hives, Swelling, and Rash     Family History  Problem Relation Age of Onset  . Stroke Brother   . Cancer Mother   . Arrhythmia Father     Social History   Social History  . Marital Status: Married    Spouse Name: N/A  . Number of Children: N/A  . Years of Education: N/A   Occupational History  . Not on file.   Social History Main Topics  . Smoking status: Former Smoker -- 3.00 packs/day for 45 years    Types: Cigarettes    Start date: 04/11/1968    Quit date: 03/11/2013  . Smokeless tobacco: Former Systems developer    Quit date: 11/13/2012     Comment: Quit x 8 months this time  . Alcohol Use: No     Comment: "quit alcohol 2011"  . Drug Use: No  . Sexual Activity: Not Currently   Other Topics Concern  . Not on file   Social History Narrative    Review of Systems: See HPI, otherwise negative ROS  Physical Exam: BP 129/79 mmHg  Pulse 93  Temp(Src) 97.2 F  (36.2 C)  Ht 5\' 10"  (1.778 m)  Wt 229 lb 12.8 oz (104.237 kg)  BMI 32.97 kg/m2   Heart rate 60 by my assessment. General:   Alert,  Well-developed, well-nourished, pleasant and cooperative in NAD Skin:  Intact without significant lesions or rashes. Eyes:  Sclera clear, no icterus.   Conjunctiva pink. Ears:  Normal auditory acuity. Nose:  No deformity, discharge,  or lesions. Mouth:  No deformity or lesions. Neck:  Supple; no masses or thyromegaly. No significant cervical adenopathy. Lungs:  Clear throughout to auscultation.   No wheezes, crackles, or rhonchi. No acute distress. Heart:  Regular rate and rhythm; no murmurs, clicks, rubs,  or gallops. Abdomen:obese. Soft and nontender without appreciable mass Pulses:  Normal pulses noted. Extremities:  Without clubbing or edema.  Impression: Nash/cirrhosis-remains well compensated.  Heart rate suppressed to 60. Likely will not go lower with  presence of a pacemaker.  However, I feel we are getting some efficacy as nonspecific beta-blockade will still produced splanchnic vasoconstriction which will reduce the pressure head on the varices. He is tolerating Nadolol very well.  GERD well-controlled.  Recommendations:  Continue with good glycemic control.  Regular exercise. Regular coffee consumption may help the liver. Liver ultrasound in November Continue Protonix and Nadolol at the current dose Office visit in 6 months      Notice: This dictation was prepared with Dragon dictation along with smaller phrase technology. Any transcriptional errors that result from this process are unintentional and may not be corrected upon review.

## 2014-12-23 NOTE — Assessment & Plan Note (Signed)
Gold stage C COPD with associated exertional hypoxemia. Stable at this time on Anoro although he did have an acute exacerbation this summer for which she was treated with antibiotics and steroids. At this time we will continue his Anoro, oxygen with exertion and while sleeping. Flu shot today

## 2015-01-07 ENCOUNTER — Other Ambulatory Visit: Payer: Self-pay | Admitting: Neurological Surgery

## 2015-01-13 ENCOUNTER — Other Ambulatory Visit: Payer: Self-pay | Admitting: "Endocrinology

## 2015-01-18 ENCOUNTER — Other Ambulatory Visit: Payer: Self-pay | Admitting: Emergency Medicine

## 2015-01-18 ENCOUNTER — Other Ambulatory Visit: Payer: Self-pay | Admitting: "Endocrinology

## 2015-01-20 ENCOUNTER — Other Ambulatory Visit: Payer: Self-pay

## 2015-01-20 DIAGNOSIS — K7469 Other cirrhosis of liver: Secondary | ICD-10-CM

## 2015-01-21 ENCOUNTER — Encounter: Payer: Self-pay | Admitting: Cardiology

## 2015-01-21 ENCOUNTER — Ambulatory Visit (INDEPENDENT_AMBULATORY_CARE_PROVIDER_SITE_OTHER): Payer: Medicare HMO | Admitting: Cardiology

## 2015-01-21 VITALS — BP 112/60 | HR 86 | Ht 70.0 in | Wt 221.2 lb

## 2015-01-21 DIAGNOSIS — R42 Dizziness and giddiness: Secondary | ICD-10-CM

## 2015-01-21 DIAGNOSIS — I10 Essential (primary) hypertension: Secondary | ICD-10-CM

## 2015-01-21 DIAGNOSIS — E785 Hyperlipidemia, unspecified: Secondary | ICD-10-CM | POA: Diagnosis not present

## 2015-01-21 DIAGNOSIS — Z950 Presence of cardiac pacemaker: Secondary | ICD-10-CM | POA: Diagnosis not present

## 2015-01-21 DIAGNOSIS — I480 Paroxysmal atrial fibrillation: Secondary | ICD-10-CM | POA: Diagnosis not present

## 2015-01-21 MED ORDER — FUROSEMIDE 20 MG PO TABS: 40.0000 mg | ORAL_TABLET | Freq: Every day | ORAL | Status: DC | PRN

## 2015-01-21 NOTE — Progress Notes (Addendum)
Patient ID: Ralph [name], male   DOB: [birth year], 64 y.o.   MRN: [id number]     Clinical Summary Mr. Geraghty is a 64 y.o.male seen today for follow up of the following medical problems.   1. Bradycardia/sinus arrest  - St Jude dual chamber pacemaker pacemaker implanted Sept 2013 ([place] DR RF).  - normal device check 10/2014 - notes some lightheadness/dizziness at times mainly with standing or walking. Reports stays well hydrated, drinks 10 glasses a day. Dizziness ccurs 4-5 times a day.   2. Afib/aflutter - previous side effects on multaq - seen by EP 06/28/13, started on flecanide. Continued to have symptoms. Had RF ablation of flutter by Dr Lovena Le 07/10/13. - denies any palpitations since procdure - on nadolol for history of palpitations as well as esoph varices  3. HTN  - compliant w/ meds  - checks bp at home once daily, typically 110s/70s   4. HL  - compliant with simva  - no recent panel in our system  5. NASH cirrhosis  - followed by GI  - historyof  grade I varices, no history of bleeding on anticoag    6. COPD  - compliant with inhalers and home oxygen.   7. Preoperative evlauation - patient being considered for removal of lumbar spinal cord stimulation by Dr Ellene Route. - denies any active cardiac conditions   Past Medical History  Diagnosis Date  . Hypertension   . Cirrhosis     NASH-Hep A and B immune  . Depressed   . Varicose vein     of esophagus  . Difficult intubation     "trouble waking up afterwards" (01/06/2012)  . CHF (congestive heart failure) 01/06/2012  . Sinus pause 01/06/2012    5.2 seconds  . Anginal pain   . Sleep apnea     "don't wear mask" (01/06/2012)  . Emphysema   . Type II diabetes mellitus   . GERD (gastroesophageal reflux disease)   . H/O hiatal hernia   . Coughing up blood     "comes from my throat" (01/06/2012)  . Arthritis     "back; fingers" (01/06/2012)  . Chronic lower back pain   . Fatty liver  disease, nonalcoholic   . CHB (complete heart block)   . Presence of permanent cardiac pacemaker 9/292013    St.Jude  . Atrial flutter     s/p EPS +RF ablation of typical atrial flutter April 2015  . Pacemaker      Allergies  Allergen Reactions  . Nitroglycerin Hives, Swelling and Rash     Current Outpatient Prescriptions  Medication Sig Dispense Refill  . albuterol (PROVENTIL HFA;VENTOLIN HFA) 108 (90 BASE) MCG/ACT inhaler Inhale 2 puffs into the lungs every 6 (six) hours as needed for wheezing.    [name] ELLIPTA 62.5-25 MCG/INH AEPB INHALE 1 PUFF ONCE DAILY 180 each 3  . buPROPion (WELLBUTRIN SR) 150 MG 12 hr tablet Take 150 mg by mouth 2 (two) times daily.    . Canagliflozin (INVOKANA) 300 MG TABS Take 300 mg by mouth daily.     . Cholecalciferol (VITAMIN D PO) Take 1 tablet by mouth daily.    . diazepam (VALIUM) 10 MG tablet Take 10 mg by mouth every 6 (six) hours as needed for anxiety.    Marland Kitchen diltiazem (CARDIZEM CD) 240 MG 24 hr capsule Take 1 capsule (240 mg total) by mouth every morning. 90 capsule 3  . escitalopram (LEXAPRO) 10 MG tablet Take 10 mg  by mouth daily.    . flecainide (TAMBOCOR) 100 MG tablet Take 1 tablet (100 mg total) by mouth 2 (two) times daily. 180 tablet 1  . furosemide (LASIX) 20 MG tablet Take 2 tablets (40 mg total) by mouth every morning. 60 tablet 3  . HYDROcodone-acetaminophen (NORCO) 10-325 MG per tablet Take 1 tablet by mouth every 6 (six) hours as needed for pain.    Marland Kitchen LEVEMIR 100 UNIT/ML injection INJECT 70 UNITS SUBCUTANEOUS AT BEDTIME 70 mL 2  . levothyroxine (SYNTHROID, LEVOTHROID) 88 MCG tablet Take 100 mcg by mouth daily before breakfast.     . metFORMIN (GLUCOPHAGE) 1000 MG tablet Take 1,000 mg by mouth 2 (two) times daily with a meal.    . mometasone-formoterol (DULERA) 100-5 MCG/ACT AERO Inhale 2 puffs into the lungs 2 (two) times daily.    . Multiple Vitamin (MULTIVITAMIN) tablet Take 1 tablet by mouth daily.    . nadolol (CORGARD) 40 MG  tablet Take 1 tablet in the morning (Patient taking differently: Take 1 tablet in the morning and 1/2 tablet by mouth every evening) 30 tablet 3  . NON FORMULARY Oxygen 2L    24/7    . NOVOLOG FLEXPEN 100 UNIT/ML FlexPen INJECT 10 - 16 UNITS SUBCUTANEOUSLY 3 TIMES A DAY BEFORE MEALS 15 mL 2  . ondansetron (ZOFRAN) 4 MG tablet Take 1 tablet (4 mg total) by mouth every 8 (eight) hours as needed for nausea or vomiting. 40 tablet 0  . pantoprazole (PROTONIX) 40 MG tablet Take 1 tablet (40 mg total) by mouth daily. 90 tablet 5  . rivaroxaban (XARELTO) 20 MG TABS tablet Take 1 tablet (20 mg total) by mouth daily with supper. 90 tablet 3  . simvastatin (ZOCOR) 20 MG tablet Take 1 tablet (20 mg total) by mouth every evening. 90 tablet 3  . spironolactone (ALDACTONE) 50 MG tablet Take 1 tablet (50 mg total) by mouth 2 (two) times daily. 180 tablet 3   No current facility-administered medications for this visit.     Past Surgical History  Procedure Laterality Date  . Back surgery    . Spinal cord stimulator implant  2006  . Cholecystectomy  1993  . Nasal septum surgery  1992  . Tonsillectomy and adenoidectomy  1992  . Posterior fusion lumbar spine  1999    L4-5  . Lumbar disc surgery  1994; ~ 1995; ~ 1996  . Warthin's tumor excision  1990's    right  . Permanent pacemaker insertion  01/08/2012    CHB  . US echocardiography  12/28/2011    mild LVH,mild mitral annulara ca+,mild MR  . Nuclear stress test  10/19/2004    No ischemia  . Esophagogastroduodenoscopy  11/08/2004    FVC:BSWHQP esophageal erosions consistent with erosive reflux esophagitis/Areas of hemorrhage and nodularity of the fundal mucosa of uncertain significance, biopsied.  Small hiatal hernia, otherwise normal stomach  . Colonoscopy  11/08/2004    RFF:MBWGYK rectum, colon, TI.  Marland Kitchen Esophagogastroduodenoscopy  2010    Dr. Gala Romney: 3 columns Grade 1 varices, erosive esophagitis, HH, portal gastropathy, normal D1, D2  .  Esophagogastroduodenoscopy (egd) with esophageal dilation N/A 02/14/2013    ZLD:JTTSV 1 esophageal varices. Abnormal distal esophagus/status post biopsy after Maloney dilation. Portal gastropathy. Antral erosions-status post biopsy. path negative for H.pylori, benign path.  . Permanent pacemaker insertion N/A 01/09/2012    Procedure: PERMANENT PACEMAKER INSERTION;  Surgeon: Sanda Klein, MD;  Location: [facility] CATH LAB;  Service: Cardiovascular;  Laterality: N/A;  .  Atrial flutter ablation N/A 07/10/2013    Procedure: ATRIAL FLUTTER ABLATION;  Surgeon: Evans Lance, MD;  Location: [facility] CATH LAB;  Service: Cardiovascular;  Laterality: N/A;  . Colonoscopy N/A 05/28/2014    Dr. Gala Romney: Redundant colon. single colonic polyp removed as described above. Tubular adenoma  . Esophagogastroduodenoscopy N/A 05/28/2014    Dr. Gala Romney: MIld erosive reflux esophagitis. Grade 1 esophageal varices. Patent esophagus. No dilation performed. Hiatal hernia.   . Esophageal dilation N/A 05/28/2014    Procedure: ESOPHAGEAL DILATION;  Surgeon: Daneil Dolin, MD;  Location: [facility];  Service: Endoscopy;  Laterality: N/A;     Allergies  Allergen Reactions  . Nitroglycerin Hives, Swelling and Rash      Family History  Problem Relation Age of Onset  . Stroke Brother   . Cancer Mother   . Arrhythmia Father      Social History Mr. Manning reports that he quit smoking about 22 months ago. His smoking use included Cigarettes. He started smoking about 46 years ago. He has a 135 pack-year smoking history. He quit smokeless tobacco use about 2 years ago. Mr. Tetreault reports that he does not drink alcohol.   Review of Systems CONSTITUTIONAL: No weight loss, fever, chills, weakness or fatigue.  HEENT: Eyes: No visual loss, blurred vision, double vision or yellow sclerae.No hearing loss, sneezing, congestion, runny nose or sore throat.  SKIN: No rash or itching.  CARDIOVASCULAR: per HPI RESPIRATORY: No shortness of  breath, cough or sputum.  GASTROINTESTINAL: No anorexia, nausea, vomiting or diarrhea. No abdominal pain or blood.  GENITOURINARY: No burning on urination, no polyuria NEUROLOGICAL:dizziness MUSCULOSKELETAL: No muscle, back pain, joint pain or stiffness.  LYMPHATICS: No enlarged nodes. No history of splenectomy.  PSYCHIATRIC: No history of depression or anxiety.  ENDOCRINOLOGIC: No reports of sweating, cold or heat intolerance. No polyuria or polydipsia.  Marland Kitchen   Physical Examination Filed Vitals:   01/21/15 1041  BP: 112/60  Pulse: 86   Filed Vitals:   01/21/15 1041  Height: 5\' 10"  (1.778 m)  Weight: 221 lb 3.2 oz (100.336 kg)    Gen: resting comfortably, no acute distress HEENT: no scleral icterus, pupils equal round and reactive, no palptable cervical adenopathy,  CV: RRR, no m/r/g,no jvd Resp: Clear to auscultation bilaterally GI: abdomen is soft, non-tender, non-distended, normal bowel sounds, no hepatosplenomegaly MSK: extremities are warm, no edema.  Skin: warm, no rash Neuro:  no focal deficits Psych: appropriate affect   Diagnostic Studies Jan 2014 Myoview: no ischemia   11/2012 Echo: LVEF 60-65%, mild LVH, moderate basal septal hypertrophy, mild LAE    Assessment and Plan  1. Afib/Aflutter - no current symptoms since recent aflutter ablation, pacemaker check with no atrial arrhythmias - defer continued anticoag and flecanide to Dr Lovena Le   2. HTN:  - at goal,continue current meds   3. HL  - followed by PCP. Reports PCP is watching his LFTs closely due to an increase and history of NASH, will defer management to PCP   4. Sinus arrest  - normal pacemaker check on last visit, continue regular checks  5. COPD - per pulmonary  6. Orthostatic dizziness - borderline orthostatic in clinic, SBP drops 13 points with standing. Will change lasix to pron only.   7. Preoperative evaluation - from cardiac standpoint ok to proceed with back surgery. Would hold  xarelto 3 days prior, restart 1 day after.    F/u 3 months  Arnoldo Lenis, M.D

## 2015-01-21 NOTE — Patient Instructions (Addendum)
Medication Instructions:  Your physician has recommended you make the following change in your medication:  1) CHANGE how you take Lasix -- take 40 mg daily as needed for swelling.  Labwork: None ordered  Testing/Procedures: None ordered  Follow-Up: Your physician recommends that you schedule a follow-up appointment in: 3 months with Dr. Harl Bowie.   Any Other Special Instructions Will Be Listed Below (If Applicable). Thank you for choosing [facility]!!

## 2015-01-22 ENCOUNTER — Encounter (HOSPITAL_COMMUNITY): Payer: Self-pay

## 2015-01-22 ENCOUNTER — Encounter (HOSPITAL_COMMUNITY)
Admission: RE | Admit: 2015-01-22 | Discharge: 2015-01-22 | Disposition: A | Discharge status: Home / Self Care | Payer: Medicare HMO | Source: Ambulatory Visit | Attending: Neurological Surgery | Admitting: Neurological Surgery

## 2015-01-22 ENCOUNTER — Telehealth: Payer: Self-pay | Admitting: Internal Medicine

## 2015-01-22 DIAGNOSIS — Z01812 Encounter for preprocedural laboratory examination: Secondary | ICD-10-CM | POA: Insufficient documentation

## 2015-01-22 DIAGNOSIS — E119 Type 2 diabetes mellitus without complications: Secondary | ICD-10-CM | POA: Insufficient documentation

## 2015-01-22 DIAGNOSIS — I10 Essential (primary) hypertension: Secondary | ICD-10-CM | POA: Insufficient documentation

## 2015-01-22 DIAGNOSIS — K219 Gastro-esophageal reflux disease without esophagitis: Secondary | ICD-10-CM | POA: Diagnosis not present

## 2015-01-22 DIAGNOSIS — J439 Emphysema, unspecified: Secondary | ICD-10-CM | POA: Insufficient documentation

## 2015-01-22 DIAGNOSIS — K7581 Nonalcoholic steatohepatitis (NASH): Secondary | ICD-10-CM | POA: Insufficient documentation

## 2015-01-22 DIAGNOSIS — Z950 Presence of cardiac pacemaker: Secondary | ICD-10-CM | POA: Insufficient documentation

## 2015-01-22 DIAGNOSIS — E039 Hypothyroidism, unspecified: Secondary | ICD-10-CM | POA: Insufficient documentation

## 2015-01-22 DIAGNOSIS — Z01818 Encounter for other preprocedural examination: Secondary | ICD-10-CM | POA: Insufficient documentation

## 2015-01-22 HISTORY — DX: Pneumonia, unspecified organism: J18.9

## 2015-01-22 HISTORY — DX: Hypothyroidism, unspecified: E03.9

## 2015-01-22 HISTORY — DX: Cerebral infarction, unspecified: I63.9

## 2015-01-22 HISTORY — DX: Other intervertebral disc degeneration, lumbar region: M51.36

## 2015-01-22 HISTORY — DX: Headache, unspecified: R51.9

## 2015-01-22 HISTORY — DX: Other intervertebral disc degeneration, lumbar region without mention of lumbar back pain or lower extremity pain: M51.369

## 2015-01-22 HISTORY — DX: Headache: R51

## 2015-01-22 HISTORY — DX: Malignant (primary) neoplasm, unspecified: C80.1

## 2015-01-22 LAB — COMPREHENSIVE METABOLIC PANEL
ALT: 35 U/L (ref 17–63)
ANION GAP: 9 (ref 5–15)
AST: 26 U/L (ref 15–41)
Albumin: 4 g/dL (ref 3.5–5.0)
Alkaline Phosphatase: 155 U/L — ABNORMAL HIGH (ref 38–126)
BILIRUBIN TOTAL: 0.6 mg/dL (ref 0.3–1.2)
BUN: 31 mg/dL — ABNORMAL HIGH (ref 6–20)
CO2: 29 mmol/L (ref 22–32)
Calcium: 10 mg/dL (ref 8.9–10.3)
Chloride: 102 mmol/L (ref 101–111)
Creatinine, Ser: 1 mg/dL (ref 0.61–1.24)
GFR calc Af Amer: >60 mL/min (ref >60)
Glucose, Bld: 114 mg/dL — ABNORMAL HIGH (ref 65–99)
POTASSIUM: 4.8 mmol/L (ref 3.5–5.1)
Sodium: 140 mmol/L (ref 135–145)
TOTAL PROTEIN: 7.5 g/dL (ref 6.5–8.1)

## 2015-01-22 LAB — CBC
HCT: 48.1 % (ref 39.0–52.0)
HEMOGLOBIN: 16.3 g/dL (ref 13.0–17.0)
MCH: 33.5 pg (ref 26.0–34.0)
MCHC: 33.9 g/dL (ref 30.0–36.0)
MCV: 99 fL (ref 78.0–100.0)
PLATELETS: 242 10*3/uL (ref 150–400)
RBC: 4.86 MIL/uL (ref 4.22–5.81)
RDW: 13 % (ref 11.5–15.5)
WBC: 9.3 10*3/uL (ref 4.0–10.5)

## 2015-01-22 LAB — PROTIME-INR
INR: 1.28 (ref 0.00–1.49)
PROTHROMBIN TIME: 16.2 s — AB (ref 11.6–15.2)

## 2015-01-22 LAB — APTT: APTT: 34 s (ref 24–37)

## 2015-01-22 LAB — SURGICAL PCR SCREEN
MRSA, PCR: NEGATIVE
STAPHYLOCOCCUS AUREUS: NEGATIVE

## 2015-01-22 LAB — GLUCOSE, CAPILLARY: GLUCOSE-CAPILLARY: 98 mg/dL (ref 65–99)

## 2015-01-22 NOTE — Progress Notes (Addendum)
PCP is Dr Sharilyn Sites Cardiologist is [facility] Pt was instructed by Dr Harl Bowie to stop Xarelto 3 days before his surgery. Today cbg was 98 pt reports his fasting cbgs run around 127 Mr Ishaq reports he has has multi sleep studies in the past, the last sleep study done shows no sleep apnea, but he wears o2 at night. Multi scabs noted to his arms where he had some skin cancer removed- no drainage noted from sites [place], [place] asked to see the pt due to difficultly intubation in the past, pt also on home o2 at night

## 2015-01-22 NOTE — Pre-Procedure Instructions (Signed)
Ralph [name]  01/22/2015      [id number] - [place], [place] [address] [place] [address] Phone: [phone] Fax: [phone]    Your procedure is scheduled on October 18  Report to [facility] at 900 A.M.  Call this number if you have problems the morning of surgery:  [phone]   Remember:  Do not eat food or drink liquids after midnight.  Take these medicines the morning of surgery with A SIP OF WATER  Albuterol (Proventil) inhaler if needed, Anoro Ellipta-bring inhalers  you on the day of surgery, Bupropion (Wellbutrin), Diazepam (Valium) if needed, Diltiazem (cardizem), escitalopram (Lexapro), Hydrocodone-acetaminophen (Norco) if needed, Levothyroxine (Synthroid), Nadolol (Corgard),  Pantoprazole (Protonix), Flecainide (Tambocor)  Stop Xarelto as directed by your Dr How to Manage Your Diabetes Before Surgery   Why is it important to control my blood sugar before and after surgery?   Improving blood sugar levels before and after surgery helps healing and can limit problems.  A way of improving blood sugar control is eating a healthy diet by:  - Eating less sugar and carbohydrates  - Increasing activity/exercise  - Talk with your doctor about reaching your blood sugar goals  High blood sugars (greater than 180 mg/dL) can raise your risk of infections and slow down your recovery so you will need to focus on controlling your diabetes during the weeks before surgery.  Make sure that the doctor who takes care of your diabetes knows about your planned surgery including the date and location.  How do I manage my blood sugars before surgery?   Check your blood sugar at least 4 times a day, 2 days before surgery to make sure that they are not too high or low.   Check your blood sugar the morning of your surgery when you wake up and every 2               hours until you get to the Short-Stay  unit.  If your blood sugar is less than 70 mg/dL, you will need to treat for low blood sugar by:  Treat a low blood sugar (less than 70 mg/dL) with 1/2 cup of clear juice (cranberry or apple), 4 glucose tablets, OR glucose gel.  Recheck blood sugar in 15 minutes after treatment (to make sure it is greater than 70 mg/dL).  If blood sugar is not greater than 70 mg/dL on re-check, call [phone] for further instructions.   Report your blood sugar to the Short-Stay nurse when you get to Short-Stay.  References:  [facility], 2007 "How to Manage your Diabetes Before and After Surgery".  What do I do about my diabetes medications?   Do not take oral diabetes medicines (pills) the morning of surgery. Don't take Invokana the morning of surgery.  THE NIGHT BEFORE SURGERY, take 56 units of Levemir Insulin.    Do not take other diabetes injectables the day of surgery including Byetta, Victoza, Bydureon, and Trulicity.    If your CBG is greater than 220 mg/dL, you may take 1/2 of your sliding scale (correction) dose of insulin.   For patients with "Insulin Pumps":  Contact your diabetes doctor for specific instructions before surgery.   Decrease basal insulin rates by 20% at midnight the night before surgery.  Note that if your surgery is planned to be longer than 2 hours, your insulin pump will be removed and intravenous (  IV) insulin will be started and managed by the nurses and anesthesiologist.  You will be able to restart your insulin pump once you are awake and able to manage it.  Make sure to bring insulin pump supplies to the hospital with you in case your site needs to be changed.        Do not wear jewelry, make-up or nail polish.  Do not wear lotions, powders, or perfumes.  You may wear deodorant.  Do not shave 48 hours prior to surgery.  Men may shave face and neck.  Do not bring valuables to the hospital.  [facility] is not responsible for  any belongings or valuables.  Contacts, dentures or bridgework may not be worn into surgery.  Leave your suitcase in the car.  After surgery it may be brought to your room.  For patients admitted to the hospital, discharge time will be determined by your treatment team.  Patients discharged the day of surgery will not be allowed to drive home.    Special instructions:  Ralph [name] - Preparing for Surgery  Before surgery, you can play an important role.  Because skin is not sterile, your skin needs to be as free of germs as possible.  You can reduce the number of germs on you skin by washing with CHG (chlorahexidine gluconate) soap before surgery.  CHG is an antiseptic cleaner which kills germs and bonds with the skin to continue killing germs even after washing.  Please DO NOT use if you have an allergy to CHG or antibacterial soaps.  If your skin becomes reddened/irritated stop using the CHG and inform your nurse when you arrive at Short Stay.  Do not shave (including legs and underarms) for at least 48 hours prior to the first CHG shower.  You may shave your face.  Please follow these instructions carefully:   1.  Shower with CHG Soap the night before surgery and the    morning of Surgery.  2.  If you choose to wash your hair, wash your hair first as usual with your   normal shampoo.  3.  After you shampoo, rinse your hair and body thoroughly to remove the   Shampoo.  4.  Use CHG as you would any other liquid soap.  You can apply chg directly   to the skin and wash gently with scrungie or a clean washcloth.  5.  Apply the CHG Soap to your body ONLY FROM THE NECK DOWN.    Do not use on open wounds or open sores.  Avoid contact with your eyes,  ears, mouth and genitals (private parts).  Wash genitals (private parts)   with your normal soap.  6.  Wash thoroughly, paying special attention to the area where your surgery        will be performed.  7.  Thoroughly rinse your body with warm water  from the neck down.  8.  DO NOT shower/wash with your normal soap after using and rinsing off the CHG Soap.  9.  Pat yourself dry with a clean towel.            10.  Wear clean pajamas.            11.  Place clean sheets on your bed the night of your first shower and do not  sleep with pets.  Day of Surgery  Do not apply any lotions/deoderants the morning of surgery.  Please wear clean clothes to the hospital/surgery center.  Please read over the following fact sheets that you were given. Pain Booklet, Coughing and Deep Breathing, MRSA Information and Surgical Site Infection Prevention

## 2015-01-22 NOTE — Telephone Encounter (Signed)
Mailed letter °

## 2015-01-22 NOTE — Telephone Encounter (Signed)
NOV RECALL FOR LIVER ULTRASOUND

## 2015-01-22 NOTE — Progress Notes (Addendum)
Anesthesia PAT Evaluation: Patient is a 64 year old male scheduled for lumbar spinal cord stimulator removal on 01/27/15 by Dr. Ellene Route. It is migrating [place] and is no longer in use since his PPM was inserted.  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 OSA is listed but Dr. Agustina Caroli 12/23/14 notes indicates that "His sleep study did not show any evidence for obstructive sleep apnea or central sleep apnea. It did confirm nocturnal hypoxemia that was well treated on 2 L/m."  PCP is Dr. Hilma Favors. Endocrinologist is [name]. Pulmonologist is Dr. Baltazar Apo. GI is Dr. Gala Romney. Primary cardiologist is Dr. Harl Bowie, last visit 01/21/15. He for patient would be okay to proceed from a cardiac standpoint. Recommended holding Xarelto 3 days prior and restart one day after. EP cardiologist is Dr. Cristopher Peru.   10/27/14 EKG: SR.  11/21/12 Echo: Mild LVH with moderate septal hypertrophy, LVEF 60-65%. Indeterminate diastolic function. Mild left atrial enlargement. Mild tomoderate RV enlargement, device wire noted. Normal PASP and CVP. No pericardial effusion.  05/03/12 Nuclear stress test: Overall Impression: Normal stress nuclear study. LV Wall Motion: NL LV Function; NL Wall Motion. LVEFl 61%.  Exam shows a pleasant Caucasian male in NAD. Heart RRR, no murmur noted. Left chest wall PPM. Lungs clear. No ankle edema. He denied chest pain, SOB. Feels at his baseline from a cardiopulmonary standpoint. No longer requiring home O2 during the daytime since he underwent atrial ablation.   Anesthesia records requested from [facility]  requested. Patient does have a relatively small mouth (FB 3), but does have a full set of upper dentures which will be removed prior to surgery. Neck mobility seems okay.   Will follow-up once anesthesia records received. PPM perioperative RX form still pending.  George Hugh [facility] Short Stay Center/Anesthesiology Phone [phone] 01/22/2015 5:26 PM  Addendum: Anesthesia records received from: - 03/15/01: 3 attempts, CRNA unable to visual with laryngoscopy, MDA also initially unsuccesful. Eschmann stylet passed and 8.0 ETT inserted. - 08/30/05: MAC used. - 11/17/05: IV induction, easy mask ventilation. Laryngoscopy/intubation via Eschmann stylet with 7.5 ETT passed over without incident. One attempt.    When I spoke with him at PAT, we discussed anticipated use of a glidescope for ETT insertion with this surgery, but would he would have further discussion with his anesthesiologist on the day of surgery.   If no acute changes then I would anticipate that he could proceed as planned.  George Hugh [facility] Short Stay Center/Anesthesiology Phone [phone] 01/23/2015 6:11 PM

## 2015-01-22 NOTE — Progress Notes (Signed)
Spoke with  Ralph [name] informed him of pt surgery time and date, and type of surgery.

## 2015-01-23 ENCOUNTER — Encounter (HOSPITAL_COMMUNITY): Payer: Self-pay

## 2015-01-23 LAB — HEMOGLOBIN A1C
HEMOGLOBIN A1C: 6.8 % — AB (ref 4.8–5.6)
Mean Plasma Glucose: 148 mg/dL

## 2015-01-26 ENCOUNTER — Encounter: Payer: Medicare HMO | Admitting: *Deleted

## 2015-01-26 ENCOUNTER — Telehealth: Payer: Self-pay | Admitting: Cardiology

## 2015-01-26 NOTE — Telephone Encounter (Signed)
LMOVM reminding pt to send remote transmission.   

## 2015-01-27 ENCOUNTER — Ambulatory Visit (HOSPITAL_COMMUNITY)
Admission: RE | Admit: 2015-01-27 | Discharge: 2015-01-27 | Disposition: A | Discharge status: Home / Self Care | Payer: Medicare HMO | Source: Ambulatory Visit | Attending: Neurological Surgery | Admitting: Neurological Surgery

## 2015-01-27 ENCOUNTER — Encounter (HOSPITAL_COMMUNITY): Payer: Self-pay | Admitting: Certified Registered Nurse Anesthetist

## 2015-01-27 ENCOUNTER — Ambulatory Visit (HOSPITAL_COMMUNITY): Payer: Medicare HMO | Admitting: Certified Registered Nurse Anesthetist

## 2015-01-27 ENCOUNTER — Encounter: Payer: Self-pay | Admitting: Cardiology

## 2015-01-27 ENCOUNTER — Encounter (HOSPITAL_COMMUNITY)
Admission: RE | Disposition: A | Discharge status: Home / Self Care | Payer: Self-pay | Source: Ambulatory Visit | Attending: Neurological Surgery

## 2015-01-27 ENCOUNTER — Ambulatory Visit (HOSPITAL_COMMUNITY): Payer: Medicare HMO | Admitting: Vascular Surgery

## 2015-01-27 DIAGNOSIS — Z7901 Long term (current) use of anticoagulants: Secondary | ICD-10-CM | POA: Insufficient documentation

## 2015-01-27 DIAGNOSIS — I10 Essential (primary) hypertension: Secondary | ICD-10-CM | POA: Diagnosis not present

## 2015-01-27 DIAGNOSIS — E119 Type 2 diabetes mellitus without complications: Secondary | ICD-10-CM | POA: Diagnosis not present

## 2015-01-27 DIAGNOSIS — Z4542 Encounter for adjustment and management of neuropacemaker (brain) (peripheral nerve) (spinal cord): Secondary | ICD-10-CM | POA: Insufficient documentation

## 2015-01-27 DIAGNOSIS — Z8673 Personal history of transient ischemic attack (TIA), and cerebral infarction without residual deficits: Secondary | ICD-10-CM | POA: Diagnosis not present

## 2015-01-27 DIAGNOSIS — Z9981 Dependence on supplemental oxygen: Secondary | ICD-10-CM | POA: Insufficient documentation

## 2015-01-27 DIAGNOSIS — Z6832 Body mass index (BMI) 32.0-32.9, adult: Secondary | ICD-10-CM | POA: Diagnosis not present

## 2015-01-27 DIAGNOSIS — E039 Hypothyroidism, unspecified: Secondary | ICD-10-CM | POA: Insufficient documentation

## 2015-01-27 DIAGNOSIS — E669 Obesity, unspecified: Secondary | ICD-10-CM | POA: Diagnosis not present

## 2015-01-27 DIAGNOSIS — J449 Chronic obstructive pulmonary disease, unspecified: Secondary | ICD-10-CM | POA: Insufficient documentation

## 2015-01-27 DIAGNOSIS — Z794 Long term (current) use of insulin: Secondary | ICD-10-CM | POA: Diagnosis not present

## 2015-01-27 DIAGNOSIS — Z950 Presence of cardiac pacemaker: Secondary | ICD-10-CM | POA: Diagnosis not present

## 2015-01-27 DIAGNOSIS — I509 Heart failure, unspecified: Secondary | ICD-10-CM | POA: Insufficient documentation

## 2015-01-27 DIAGNOSIS — F17210 Nicotine dependence, cigarettes, uncomplicated: Secondary | ICD-10-CM | POA: Insufficient documentation

## 2015-01-27 HISTORY — PX: SPINAL CORD STIMULATOR REMOVAL: SHX5379

## 2015-01-27 LAB — GLUCOSE, CAPILLARY
GLUCOSE-CAPILLARY: 102 mg/dL — AB (ref 65–99)
GLUCOSE-CAPILLARY: 105 mg/dL — AB (ref 65–99)

## 2015-01-27 SURGERY — LUMBAR SPINAL CORD STIMULATOR REMOVAL
Anesthesia: General

## 2015-01-27 MED ORDER — FENTANYL CITRATE (PF) 100 MCG/2ML IJ SOLN
25.0000 ug | INTRAMUSCULAR | Status: DC | PRN
  Administered 2015-01-27 (×2): 50 ug via INTRAVENOUS

## 2015-01-27 MED ORDER — SODIUM CHLORIDE 0.9 % IR SOLN
Status: DC | PRN
  Administered 2015-01-27: 14:00:00

## 2015-01-27 MED ORDER — CEFAZOLIN SODIUM-DEXTROSE 2-3 GM-% IV SOLR
INTRAVENOUS | Status: AC
  Administered 2015-01-27: 2 g via INTRAVENOUS
  Filled 2015-01-27: qty 50

## 2015-01-27 MED ORDER — GLYCOPYRROLATE 0.2 MG/ML IJ SOLN
INTRAMUSCULAR | Status: DC | PRN
  Administered 2015-01-27: .2 mg via INTRAVENOUS

## 2015-01-27 MED ORDER — NEOSTIGMINE METHYLSULFATE 10 MG/10ML IV SOLN
INTRAVENOUS | Status: DC | PRN
  Administered 2015-01-27: 2 mg via INTRAVENOUS

## 2015-01-27 MED ORDER — PROPOFOL 10 MG/ML IV BOLUS
INTRAVENOUS | Status: DC | PRN
  Administered 2015-01-27: 150 mg via INTRAVENOUS

## 2015-01-27 MED ORDER — ALBUTEROL SULFATE HFA 108 (90 BASE) MCG/ACT IN AERS
INHALATION_SPRAY | RESPIRATORY_TRACT | Status: DC | PRN
Start: 2015-01-27 — End: 2015-01-27
  Administered 2015-01-27: 8 via RESPIRATORY_TRACT

## 2015-01-27 MED ORDER — BUPIVACAINE HCL (PF) 0.5 % IJ SOLN
INTRAMUSCULAR | Status: DC | PRN
  Administered 2015-01-27: 10 mL
  Administered 2015-01-27: 20 mL

## 2015-01-27 MED ORDER — 0.9 % SODIUM CHLORIDE (POUR BTL) OPTIME
TOPICAL | Status: DC | PRN
  Administered 2015-01-27: 1000 mL

## 2015-01-27 MED ORDER — FENTANYL CITRATE (PF) 100 MCG/2ML IJ SOLN
INTRAMUSCULAR | Status: DC | PRN
  Administered 2015-01-27 (×2): 50 ug via INTRAVENOUS
  Administered 2015-01-27: 25 ug via INTRAVENOUS
  Administered 2015-01-27: 50 ug via INTRAVENOUS
  Administered 2015-01-27: 25 ug via INTRAVENOUS

## 2015-01-27 MED ORDER — CEFAZOLIN SODIUM-DEXTROSE 2-3 GM-% IV SOLR: 2.0000 g | INTRAVENOUS | Status: DC

## 2015-01-27 MED ORDER — ONDANSETRON HCL 4 MG/2ML IJ SOLN
INTRAMUSCULAR | Status: DC | PRN
  Administered 2015-01-27 (×2): 4 mg via INTRAVENOUS

## 2015-01-27 MED ORDER — ROCURONIUM BROMIDE 100 MG/10ML IV SOLN
INTRAVENOUS | Status: DC | PRN
  Administered 2015-01-27: 20 mg via INTRAVENOUS

## 2015-01-27 MED ORDER — LIDOCAINE-EPINEPHRINE 1 %-1:100000 IJ SOLN
INTRAMUSCULAR | Status: DC | PRN
  Administered 2015-01-27: 10 mL

## 2015-01-27 MED ORDER — DEXAMETHASONE SODIUM PHOSPHATE 4 MG/ML IJ SOLN
INTRAMUSCULAR | Status: DC | PRN
  Administered 2015-01-27: 4 mg via INTRAVENOUS

## 2015-01-27 MED ORDER — PROPOFOL 10 MG/ML IV BOLUS
INTRAVENOUS | Status: AC
  Filled 2015-01-27: qty 20

## 2015-01-27 MED ORDER — SUCCINYLCHOLINE CHLORIDE 20 MG/ML IJ SOLN
INTRAMUSCULAR | Status: DC | PRN
  Administered 2015-01-27: 120 mg via INTRAVENOUS

## 2015-01-27 MED ORDER — EPHEDRINE SULFATE 50 MG/ML IJ SOLN
INTRAMUSCULAR | Status: DC | PRN
  Administered 2015-01-27: 5 mg via INTRAVENOUS
  Administered 2015-01-27 (×3): 10 mg via INTRAVENOUS

## 2015-01-27 MED ORDER — FENTANYL CITRATE (PF) 250 MCG/5ML IJ SOLN
INTRAMUSCULAR | Status: AC
  Filled 2015-01-27: qty 5

## 2015-01-27 MED ORDER — FENTANYL CITRATE (PF) 100 MCG/2ML IJ SOLN
INTRAMUSCULAR | Status: AC
  Filled 2015-01-27: qty 2

## 2015-01-27 MED ORDER — LACTATED RINGERS IV SOLN
INTRAVENOUS | Status: DC
  Administered 2015-01-27 (×2): via INTRAVENOUS

## 2015-01-27 MED ORDER — LIDOCAINE HCL (CARDIAC) 20 MG/ML IV SOLN
INTRAVENOUS | Status: DC | PRN
  Administered 2015-01-27: 5 mL via INTRAVENOUS

## 2015-01-27 MED ORDER — PROMETHAZINE HCL 25 MG/ML IJ SOLN: 6.2500 mg | INTRAMUSCULAR | Status: DC | PRN

## 2015-01-27 SURGICAL SUPPLY — 48 items
2.0MM MATCH HEAD ×2 IMPLANT
ADH SKN CLS APL DERMABOND .7 (GAUZE/BANDAGES/DRESSINGS) ×2
APL SKNCLS STERI-STRIP NONHPOA (GAUZE/BANDAGES/DRESSINGS)
BAG DECANTER FOR FLEXI CONT (MISCELLANEOUS) ×3 IMPLANT
BENZOIN TINCTURE PRP APPL 2/3 (GAUZE/BANDAGES/DRESSINGS) IMPLANT
BLADE CLIPPER SURG (BLADE) IMPLANT
CLOSURE WOUND 1/2 X4 (GAUZE/BANDAGES/DRESSINGS)
DECANTER SPIKE VIAL GLASS SM (MISCELLANEOUS) ×3 IMPLANT
DERMABOND ADVANCED (GAUZE/BANDAGES/DRESSINGS) ×4
DERMABOND ADVANCED .7 DNX12 (GAUZE/BANDAGES/DRESSINGS) IMPLANT
DRAPE LAPAROTOMY 100X72X124 (DRAPES) ×3 IMPLANT
DRAPE POUCH INSTRU U-SHP 10X18 (DRAPES) ×3 IMPLANT
DURAPREP 26ML APPLICATOR (WOUND CARE) ×3 IMPLANT
ELECT REM PT RETURN 9FT ADLT (ELECTROSURGICAL) ×3
ELECTRODE REM PT RTRN 9FT ADLT (ELECTROSURGICAL) ×1 IMPLANT
GAUZE SPONGE 4X4 16PLY XRAY LF (GAUZE/BANDAGES/DRESSINGS) IMPLANT
GLOVE BIOGEL PI IND STRL 8.5 (GLOVE) ×1 IMPLANT
GLOVE BIOGEL PI INDICATOR 8.5 (GLOVE) ×2
GLOVE ECLIPSE 8.5 STRL (GLOVE) ×3 IMPLANT
GLOVE EXAM NITRILE LRG STRL (GLOVE) IMPLANT
GLOVE EXAM NITRILE MD LF STRL (GLOVE) IMPLANT
GLOVE EXAM NITRILE XL STR (GLOVE) IMPLANT
GLOVE EXAM NITRILE XS STR PU (GLOVE) IMPLANT
GOWN STRL REUS W/ TWL LRG LVL3 (GOWN DISPOSABLE) IMPLANT
GOWN STRL REUS W/ TWL XL LVL3 (GOWN DISPOSABLE) ×1 IMPLANT
GOWN STRL REUS W/TWL 2XL LVL3 (GOWN DISPOSABLE) ×3 IMPLANT
GOWN STRL REUS W/TWL LRG LVL3 (GOWN DISPOSABLE)
GOWN STRL REUS W/TWL XL LVL3 (GOWN DISPOSABLE) ×3
KIT BASIN OR (CUSTOM PROCEDURE TRAY) ×3 IMPLANT
KIT NEUROSTIMULATOR ACCESSORY (KITS) ×2 IMPLANT
KIT ROOM TURNOVER OR (KITS) ×3 IMPLANT
NEEDLE HYPO 22GX1.5 SAFETY (NEEDLE) ×3 IMPLANT
NS IRRIG 1000ML POUR BTL (IV SOLUTION) ×3 IMPLANT
PACK LAMINECTOMY NEURO (CUSTOM PROCEDURE TRAY) ×3 IMPLANT
PAD ARMBOARD 7.5X6 YLW CONV (MISCELLANEOUS) ×13 IMPLANT
SPONGE LAP 4X18 X RAY DECT (DISPOSABLE) IMPLANT
SPONGE SURGIFOAM ABS GEL SZ50 (HEMOSTASIS) ×1 IMPLANT
STAPLER SKIN PROX WIDE 3.9 (STAPLE) IMPLANT
STRIP CLOSURE SKIN 1/2X4 (GAUZE/BANDAGES/DRESSINGS) IMPLANT
SUT SILK 0 TIES 10X30 (SUTURE) IMPLANT
SUT VIC AB 0 CT1 18XCR BRD8 (SUTURE) ×1 IMPLANT
SUT VIC AB 0 CT1 8-18 (SUTURE) ×3
SUT VIC AB 2-0 CP2 18 (SUTURE) ×5 IMPLANT
SUT VIC AB 3-0 SH 8-18 (SUTURE) ×5 IMPLANT
SYR CONTROL 10ML LL (SYRINGE) ×3 IMPLANT
TOWEL OR 17X24 6PK STRL BLUE (TOWEL DISPOSABLE) ×3 IMPLANT
TOWEL OR 17X26 10 PK STRL BLUE (TOWEL DISPOSABLE) ×3 IMPLANT
WATER STERILE IRR 1000ML POUR (IV SOLUTION) ×3 IMPLANT

## 2015-01-27 NOTE — Transfer of Care (Signed)
Immediate Anesthesia Transfer of Care Note  Patient: Ralph [name]  Procedure(s) Performed: Procedure(s) with comments: LUMBAR SPINAL CORD STIMULATOR REMOVAL (N/A) - LUMBAR SPINAL CORD STIMULATOR REMOVAL  Patient Location: PACU  Anesthesia Type:General  Level of Consciousness: awake, alert  and oriented  Airway & Oxygen Therapy: Patient Spontanous Breathing and Patient connected to nasal cannula oxygen  Post-op Assessment: Report given to RN and Post -op Vital signs reviewed and stable  Post vital signs: Reviewed and stable  Last Vitals:  Filed Vitals:   01/27/15 0905  BP: 139/65  Pulse: 60  Temp: 37 C  Resp: 20    Complications: No apparent anesthesia complications

## 2015-01-27 NOTE — Anesthesia Procedure Notes (Addendum)
Procedure Name: Intubation Date/Time: 01/27/2015 1:30 PM Performed by: Merdis Delay Pre-anesthesia Checklist: Patient identified, Timeout performed, Emergency Drugs available, Suction available and Patient being monitored Patient Re-evaluated:Patient Re-evaluated prior to inductionOxygen Delivery Method: Circle system utilized Preoxygenation: Pre-oxygenation with 100% oxygen Intubation Type: IV induction Ventilation: Mask ventilation without difficulty Laryngoscope Size: Mac and 3 Grade View: Grade II Tube type: Oral Tube size: 7.5 mm Number of attempts: 1 Airway Equipment and Method: Stylet Placement Confirmation: ETT inserted through vocal cords under direct vision,  breath sounds checked- equal and bilateral,  positive ETCO2 and CO2 detector Secured at: 22 cm Tube secured with: Tape Dental Injury: Teeth and Oropharynx as per pre-operative assessment  Difficulty Due To: Difficulty was anticipated Comments: Previous history of difficult intubation with multiple DL attempts and subsequent elective glide use for following surgery. This admission- DL x1 with MAC 3- Grade 2/3 view with cricoid pressure. Arytenoids/base of cords slightly visible--> ETT passed with ease.

## 2015-01-27 NOTE — Progress Notes (Signed)
Pts O2 sats 80-92 on room air, 92 on 1 liter,2 liters 98, spoke with [facility] and they will bring tank for pt to go home with.

## 2015-01-27 NOTE — H&P (Signed)
Ralph [name] is an 64 y.o. male.   Chief Complaint: Chronic back pain expired spinal cord stimulator HPI: Patient is a 64 year old individual is had a spinal cord stimulator for a number of years. Some years ago this stopped working is not noted that he's had any ill effects from it not working however he has a mass over the posterior superior iliac crest region on the right side which is uncomfortable and he desires to have a spinal cord stimulator removed is now admitted for this process.  Past Medical History  Diagnosis Date  . Hypertension   . Cirrhosis ([place])     NASH-Hep A and B immune  . Depressed   . Varicose vein     of esophagus  . CHF (congestive heart failure) ([place]) 01/06/2012  . Sinus pause 01/06/2012    5.2 seconds  . Anginal pain ([place])   . Sleep apnea     "don't wear mask" (01/06/2012)  . Emphysema   . Type II diabetes mellitus ([place])   . GERD (gastroesophageal reflux disease)   . H/O hiatal hernia   . Coughing up blood     "comes from my throat" (01/06/2012)  . Arthritis     "back; fingers" (01/06/2012)  . Chronic lower back pain   . Fatty liver disease, nonalcoholic   . CHB (complete heart block) ([place])   . Presence of permanent cardiac pacemaker 9/292013    [employer]ude  . Atrial flutter [facility])     s/p EPS +RF ablation of typical atrial flutter April 2015  . Pacemaker   . Stroke [facility])     pt states that he might have had a stroke not sure  . Pneumonia Aug 2016  . Hypothyroidism   . Headache   . DDD (degenerative disc disease), lumbar   . Cancer ([place]     skin cancer  . Difficult intubation     Eschmann stylet used in 2002 and 2007; "trouble waking up afterwards" (01/06/2012)    Past Surgical History  Procedure Laterality Date  . Back surgery    . Spinal cord stimulator implant  2006  . Cholecystectomy  1993  . Nasal septum surgery  1992  . Tonsillectomy and adenoidectomy  1992  . Posterior fusion lumbar spine  1999    L4-5  . Lumbar disc surgery  1994; ~  1995; ~ 1996  . Warthin's tumor excision  1990's    right  . Permanent pacemaker insertion  01/08/2012    CHB  . US echocardiography  12/28/2011    mild LVH,mild mitral annulara ca+,mild MR  . Nuclear stress test  10/19/2004    No ischemia  . Esophagogastroduodenoscopy  11/08/2004    PPJ:KDTOIZ esophageal erosions consistent with erosive reflux esophagitis/Areas of hemorrhage and nodularity of the fundal mucosa of uncertain significance, biopsied.  Small hiatal hernia, otherwise normal stomach  . Colonoscopy  11/08/2004    TIW:PYKDXI rectum, colon, TI.  Marland Kitchen Esophagogastroduodenoscopy  2010    Dr. Gala Romney: 3 columns Grade 1 varices, erosive esophagitis, HH, portal gastropathy, normal D1, D2  . Esophagogastroduodenoscopy (egd) with esophageal dilation N/A 02/14/2013    PJA:SNKNL 1 esophageal varices. Abnormal distal esophagus/status post biopsy after Maloney dilation. Portal gastropathy. Antral erosions-status post biopsy. path negative for H.pylori, benign path.  . Permanent pacemaker insertion N/A 01/09/2012    Procedure: PERMANENT PACEMAKER INSERTION;  Surgeon: Sanda Klein, MD;  Location: [place] CATH LAB;  Service: Cardiovascular;  Laterality: N/A;  . Atrial flutter ablation N/A 07/10/2013  Procedure: ATRIAL FLUTTER ABLATION;  Surgeon: Evans Lance, MD;  Location: [facility] C[facility] LAB;  Service: Cardiovascular;  Laterality: N/A;  . Colonoscopy N/A 05/28/2014    Dr. Gala Romney: Redundant colon. single colonic polyp removed as described above. Tubular adenoma  . Esophagogastroduodenoscopy N/A 05/28/2014    Dr. Gala Romney: MIld erosive reflux esophagitis. Grade 1 esophageal varices. Patent esophagus. No dilation performed. Hiatal hernia.   . Esophageal dilation N/A 05/28/2014    Procedure: ESOPHAGEAL DILATION;  Surgeon: Daneil Dolin, MD;  Location[facility];  Service: Endoscopy;  Laterality: N/A;    Family History  Problem Relation Age of Onset  . Stroke Brother   . Cancer Mother   . Arrhythmia Father     Social History:  reports that he has been smoking Cigarettes.  He started smoking about 46 years ago. He has a 22.5 pack-year smoking history. He quit smokeless tobacco use about 2 years ago. He reports that he does not drink alcohol or use illicit drugs.  Allergies:  Allergies  Allergen Reactions  . Nitroglycerin Hives, Swelling and Rash    Medications Prior to Admission  Medication Sig Dispense Refill  . albuterol (PROVENTIL HFA;VENTOLIN HFA) 108 (90 BASE) MCG/ACT inhaler Inhale 2 puffs into the lungs every 6 (six) hours as needed for wheezing.    Ralph [name] ELLIPTA 62.5-25 MCG/INH AEPB INHALE 1 PUFF ONCE DAILY 180 each 3  . buPROPion (WELLBUTRIN SR) 150 MG 12 hr tablet Take 150 mg by mouth 2 (two) times daily.    . Canagliflozin (INVOKANA) 300 MG TABS Take 300 mg by mouth daily.     . Cholecalciferol (VITAMIN D PO) Take 1 tablet by mouth daily.    . Cyanocobalamin (VITAMIN B 12 PO) Take 1 tablet by mouth daily.    . diazepam (VALIUM) 10 MG tablet Take 10 mg by mouth every 6 (six) hours as needed for anxiety.    Marland Kitchen diltiazem (CARDIZEM CD) 240 MG 24 hr capsule Take 1 capsule (240 mg total) by mouth every morning. 90 capsule 3  . escitalopram (LEXAPRO) 10 MG tablet Take 10 mg by mouth daily.    . flecainide (TAMBOCOR) 100 MG tablet Take 1 tablet (100 mg total) by mouth 2 (two) times daily. 180 tablet 1  . HYDROcodone-acetaminophen (NORCO) 10-325 MG per tablet Take 1 tablet by mouth every 6 (six) hours as needed for pain.    Marland Kitchen LEVEMIR 100 UNIT/ML injection INJECT 70 UNITS SUBCUTANEOUS AT BEDTIME 70 mL 2  . levothyroxine (SYNTHROID, LEVOTHROID) 100 MCG tablet Take 100 mcg by mouth daily before breakfast.    . Multiple Vitamin (MULTIVITAMIN) tablet Take 1 tablet by mouth daily.    . nadolol (CORGARD) 40 MG tablet Take 1 tablet in the morning (Patient taking differently: Take 20-40 mg by mouth 2 (two) times daily. Take 1 tablet in the morning and 1/2 tablet by mouth every evening) 30 tablet 3  .  NOVOLOG FLEXPEN 100 UNIT/ML FlexPen INJECT 10 - 16 UNITS SUBCUTANEOUSLY 3 TIMES A DAY BEFORE MEALS (Patient taking differently: INJECT 5-10  UNITS SUBCUTANEOUSLY 3 TIMES A DAY BEFORE MEALS) 15 mL 2  . ondansetron (ZOFRAN) 4 MG tablet Take 1 tablet (4 mg total) by mouth every 8 (eight) hours as needed for nausea or vomiting. 40 tablet 0  . OXYGEN Inhale 2 L into the lungs at bedtime.    . pantoprazole (PROTONIX) 40 MG tablet Take 1 tablet (40 mg total) by mouth daily. 90 tablet 5  . rivaroxaban (XARELTO)  20 MG TABS tablet Take 1 tablet (20 mg total) by mouth daily with supper. 90 tablet 3  . simvastatin (ZOCOR) 20 MG tablet Take 1 tablet (20 mg total) by mouth every evening. 90 tablet 3  . spironolactone (ALDACTONE) 50 MG tablet Take 1 tablet (50 mg total) by mouth 2 (two) times daily. 180 tablet 3  . furosemide (LASIX) 20 MG tablet Take 2 tablets (40 mg total) by mouth daily as needed (swelling). 60 tablet 0    Results for orders placed or performed during the hospital encounter of 01/27/15 (from the past 48 hour(s))  Glucose, capillary     Status: Abnormal   Collection Time: 01/27/15  9:11 AM  Result Value Ref Range   Glucose-Capillary 102 (H) 65 - 99 mg/dL   No results found.  Review of Systems  Constitutional: Negative.   Eyes: Negative.   Respiratory: Negative.   Cardiovascular: Negative.   Gastrointestinal: Negative.   Genitourinary: Negative.   Musculoskeletal: Positive for back pain and neck pain.  Skin: Negative.   Neurological: Positive for sensory change and focal weakness.  Endo/Heme/Allergies: Negative.   Psychiatric/Behavioral: Negative.     Blood pressure 139/65, pulse 60, temperature 98.6 F (37 C), resp. rate 20, height 5\' 10"  (1.778 m), weight 101.606 kg (224 lb), SpO2 97 %. Physical Exam  Constitutional: He is oriented to person, place, and time. He appears well-developed and well-nourished.  Obese  HENT:  Head: Normocephalic and atraumatic.  Eyes:  Conjunctivae and EOM are normal. Pupils are equal, round, and reactive to light.  Neck: Normal range of motion.  Cardiovascular: Normal rate and regular rhythm.   Respiratory: Effort normal and breath sounds normal.  GI: Soft. Bowel sounds are normal.  Musculoskeletal:  Well-healed scar over posterior suprailiac crest on right with mass palpable over gluteal region. Corresponds to stimulator generator  Neurological: He is alert and oriented to person, place, and time. He has normal reflexes.  Skin: Skin is warm and dry.  Psychiatric: He has a normal mood and affect. His behavior is normal. Judgment and thought content normal.     Assessment/Plan Expired spinal cord stimulator  Removal of spinal cord stimulator  Amelia Macken J 01/27/2015, 12:53 PM

## 2015-01-27 NOTE — Op Note (Signed)
Date of surgery: 01/27/2015 Preoperative diagnosis: Expires spinal cord pain management stimulator  Postoperative diagnosis: Expired spinal cord pain management stimulator Procedure: Extraction of previously placed spinal cord stimulator Surgeon: Kristeen Miss Anesthesia: Gen. endotracheal Indications: Ralph [name] is a 64 year old individual who approximately 7 years ago underwent placement of a spinal cord stimulator gradually he noted less efficacy with this device but then he required placement of a automatic defibrillator which necessitated shutting off the spinal cord stimulator completely is been annoyed with the presence of a mass in his posterior superior iliac crest region and wishes to have it removed.  Procedure: The patient was brought to the operating room supine on a stretcher. After the smooth induction of general endotracheal anesthesia he was turned prone. The previously noted areas of the back and the right posterior superior iliac crest region in the midline lower thoracic spine were prepped with alcohol and DuraPrep and draped in a sterile fashion. The areas of the incisions were infiltrated with 10 mL of lidocaine with epinephrine mixed 50-50 with half percent Marcaine and then a vertical incision was made through the old incision in the right posterior superior iliac crest region. Immediately the previously placed device came into view and after doing some blunt dissection around the device could be removed through the incision was made. The leads that were attached to it were then freed. A second incision was made in the previously made incision in the thoracic lumbar junction. The dissection was carried down to subcutaneous needs tissues and the lead attachments just adjacent to the spinal canal were identified. Then by gradually dissecting through the soft tissues the leads which were coiled into this region were unfolded and the coils removed. The entry site between the  lamina was then identified on the right side. This was noted to be tightly bound down in scar and overgrown bone. A high-speed drill was used to perform a laminotomy adjacent to these leads to allow visualization of the paddle electrode so that it could be mobilized and removed. The paddle electrodes were then mobilized and removed. The wounds were inspected carefully irrigated with saline and then the fascia was closed region incision with 20 Vicryls interrupted fashion 20 Vicryls used in the subcutaneous tissues and 30 Vicryls subcuticularly. A total of 20 mL of half percent Marcaine was injected into the paraspinous fascia of the thoracolumbar junction. Patient tolerated procedure well blood loss for the procedure was nil and he was returned to recovery room in stable condition.

## 2015-01-27 NOTE — Anesthesia Postprocedure Evaluation (Signed)
  Anesthesia Post-op Note  Patient: Ralph [name]  Procedure(s) Performed: Procedure(s) with comments: LUMBAR SPINAL CORD STIMULATOR REMOVAL (N/A) - LUMBAR SPINAL CORD STIMULATOR REMOVAL  Patient Location: PACU  Anesthesia Type:General  Level of Consciousness: awake, alert , oriented and patient cooperative  Airway and Oxygen Therapy: Patient connected to nasal cannula oxygen  Post-op Pain: mild  Post-op Assessment: Post-op Vital signs reviewed, Patient's Cardiovascular Status Stable, Respiratory Function Stable, Patent Airway and No signs of Nausea or vomiting    Patient with history of home O2 use at night only.  On arrival to hospital this AM, satting 88% on room air.  Currently on 2L [place] in PACU.            Post-op Vital Signs: Reviewed and stable  Last Vitals:  Filed Vitals:   01/27/15 1541  BP:   Pulse: 62  Temp:   Resp: 10    Complications: No apparent anesthesia complications

## 2015-01-27 NOTE — Anesthesia Preprocedure Evaluation (Addendum)
Anesthesia Evaluation  Patient identified by MRN, date of birth, ID band Patient awake    Reviewed: Allergy & Precautions, NPO status , Patient's Chart, lab work & pertinent test results, reviewed documented beta blocker date and time   History of Anesthesia Complications (+) DIFFICULT AIRWAY and history of anesthetic complications  Airway Mallampati: II  TM Distance: >3 FB Neck ROM: Full    Dental  (+) Dental Advisory Given, Upper Dentures, Missing   Pulmonary pneumonia, resolved, COPD (2L O2 at night),  oxygen dependent, Current Smoker,    Pulmonary exam normal breath sounds clear to auscultation       Cardiovascular hypertension, Pt. on medications and Pt. on home beta blockers +CHF  Normal cardiovascular exam+ dysrhythmias (s/p RF ablation 4/15) Atrial Fibrillation + pacemaker ([employer] BiV pacer)  Rhythm:Regular Rate:Normal     Neuro/Psych  Headaches, PSYCHIATRIC DISORDERS Depression CVA    GI/Hepatic hiatal hernia, GERD  Medicated,(+) Cirrhosis  (NASH cirrhosis)  Esophageal Varices    ,   Endo/Other  diabetes, Type 2, Insulin Dependent, Oral Hypoglycemic AgentsHypothyroidism Obesity   Renal/GU negative Renal ROS     Musculoskeletal  (+) Arthritis , Osteoarthritis,    Abdominal   Peds  Hematology  (+) Blood dyscrasia, anemia ,   Anesthesia Other Findings Day of surgery medications reviewed with the patient.   Reproductive/Obstetrics                          Anesthesia Physical Anesthesia Plan  ASA: III  Anesthesia Plan: General   Post-op Pain Management:    Induction: Intravenous  Airway Management Planned: Oral ETT and Video Laryngoscope Planned  Additional Equipment:   Intra-op Plan:   Post-operative Plan: Extubation in OR  Informed Consent: I have reviewed the patients History and Physical, chart, labs and discussed the procedure including the risks, benefits and  alternatives for the proposed anesthesia with the patient or authorized representative who has indicated his/her understanding and acceptance.   Dental advisory given  Plan Discussed with: CRNA  Anesthesia Plan Comments: (Risks/benefits of general anesthesia discussed with patient including risk of damage to teeth, lips, gum, and tongue, nausea/vomiting, allergic reactions to medications, and the possibility of heart attack, stroke and death.  All patient questions answered.  Patient wishes to proceed.)       Anesthesia Quick Evaluation 10/27/14 EKG: SR.  11/21/12 Echo: Mild LVH with moderate septal hypertrophy, LVEF 60-65%. Indeterminate diastolic function. Mild left atrial enlargement. Mild tomoderate RV enlargement, device wire noted. Normal PASP and CVP. No pericardial effusion.  05/03/12 Nuclear stress test: Overall Impression: Normal stress nuclear study. LV Wall Motion: NL LV Function; NL Wall Motion. LVEFl 61%.

## 2015-01-28 ENCOUNTER — Encounter (HOSPITAL_COMMUNITY): Payer: Self-pay | Admitting: Neurological Surgery

## 2015-01-28 NOTE — Discharge Summary (Signed)
Physician Discharge Summary  Patient ID: Ralph [name] MRN: [id number] DOB/AGE: [birth year] 64 y.o.  Admit date: 01/27/2015 Discharge date: 01/27/2015  Admission Diagnoses: Nonfunctioning spinal cord stimulator, severe COPD  Discharge Diagnoses: Nonfunctioning spinal cord stimulator, severe COPD, Active Problems:   * No active hospital problems. *   Discharged Condition: good  Hospital Course: Patient tolerated removal of his spinal cord stimulator without difficulty  Consults: None  Significant Diagnostic Studies: None  Treatments: Removal spinal cord stimulate  Discharge Exam: Blood pressure 103/45, pulse 60, temperature 97 F (36.1 C), resp. rate 14, height 5\' 10"  (1.778 m), weight 101.606 kg (224 lb), SpO2 89 %. Incisions are clean dry motor function remains intact. Patient has decreased O2 sat ration on 2 L of oxygen to 91  Disposition: 01-Home or Self Care  Discharge Instructions    Call MD for:  redness, tenderness, or signs of infection (pain, swelling, redness, odor or green/yellow discharge around incision site)    Complete by:  As directed      Call MD for:  severe uncontrolled pain    Complete by:  As directed      Call MD for:  temperature >100.4    Complete by:  As directed      Diet - low sodium heart healthy    Complete by:  As directed      Discharge instructions    Complete by:  As directed   Okay to shower. Do not apply salves or appointments to incision. No heavy lifting with the upper extremities greater than 15 pounds. May resume driving when not requiring pain medication and patient feels comfortable with doing so.     Increase activity slowly    Complete by:  As directed             Medication List    TAKE these medications        albuterol 108 (90 BASE) MCG/ACT inhaler  Commonly known as:  PROVENTIL HFA;VENTOLIN HFA  Inhale 2 puffs into the lungs every 6 (six) hours as needed for wheezing.     ANORO ELLIPTA 62.5-25 MCG/INH Aepb   Generic drug:  Umeclidinium-Vilanterol  INHALE 1 PUFF ONCE DAILY     buPROPion 150 MG 12 hr tablet  Commonly known as:  WELLBUTRIN SR  Take 150 mg by mouth 2 (two) times daily.     diazepam 10 MG tablet  Commonly known as:  VALIUM  Take 10 mg by mouth every 6 (six) hours as needed for anxiety.     diltiazem 240 MG 24 hr capsule  Commonly known as:  CARDIZEM CD  Take 1 capsule (240 mg total) by mouth every morning.     escitalopram 10 MG tablet  Commonly known as:  LEXAPRO  Take 10 mg by mouth daily.     flecainide 100 MG tablet  Commonly known as:  TAMBOCOR  Take 1 tablet (100 mg total) by mouth 2 (two) times daily.     furosemide 20 MG tablet  Commonly known as:  LASIX  Take 2 tablets (40 mg total) by mouth daily as needed (swelling).     HYDROcodone-acetaminophen 10-325 MG tablet  Commonly known as:  NORCO  Take 1 tablet by mouth every 6 (six) hours as needed for pain.     INVOKANA 300 MG Tabs tablet  Generic drug:  canagliflozin  Take 300 mg by mouth daily.     LEVEMIR 100 UNIT/ML injection  Generic drug:  insulin detemir  INJECT 70  UNITS SUBCUTANEOUS AT BEDTIME     levothyroxine 100 MCG tablet  Commonly known as:  SYNTHROID, LEVOTHROID  Take 100 mcg by mouth daily before breakfast.     multivitamin tablet  Take 1 tablet by mouth daily.     nadolol 40 MG tablet  Commonly known as:  CORGARD  Take 1 tablet in the morning     NOVOLOG FLEXPEN 100 UNIT/ML FlexPen  Generic drug:  insulin aspart  INJECT 10 - 16 UNITS SUBCUTANEOUSLY 3 TIMES A DAY BEFORE MEALS     ondansetron 4 MG tablet  Commonly known as:  ZOFRAN  Take 1 tablet (4 mg total) by mouth every 8 (eight) hours as needed for nausea or vomiting.     OXYGEN  Inhale 2 L into the lungs at bedtime.     pantoprazole 40 MG tablet  Commonly known as:  PROTONIX  Take 1 tablet (40 mg total) by mouth daily.     rivaroxaban 20 MG Tabs tablet  Commonly known as:  XARELTO  Take 1 tablet (20 mg total) by  mouth daily with supper.     simvastatin 20 MG tablet  Commonly known as:  ZOCOR  Take 1 tablet (20 mg total) by mouth every evening.     spironolactone 50 MG tablet  Commonly known as:  ALDACTONE  Take 1 tablet (50 mg total) by mouth 2 (two) times daily.     VITAMIN B 12 PO  Take 1 tablet by mouth daily.     VITAMIN D PO  Take 1 tablet by mouth daily.         SignedEarleen Newport 01/28/2015, 8:48 AM

## 2015-02-17 LAB — HEPATIC FUNCTION PANEL
ALK PHOS: 151 U/L — AB (ref 40–115)
ALT: 34 U/L (ref 9–46)
AST: 23 U/L (ref 10–35)
Albumin: 4.2 g/dL (ref 3.6–5.1)
BILIRUBIN DIRECT: 0.1 mg/dL (ref <=0.2)
BILIRUBIN TOTAL: 0.5 mg/dL (ref 0.2–1.2)
Indirect Bilirubin: 0.4 mg/dL (ref 0.2–1.2)
Total Protein: 6.7 g/dL (ref 6.1–8.1)

## 2015-02-18 ENCOUNTER — Encounter: Payer: Self-pay | Admitting: Internal Medicine

## 2015-02-18 NOTE — Progress Notes (Signed)
Quick Note:  Your latest liver enzymes are improved but not normal. I recommend this blood work be repeated in 4 months. You should also continue to have an ultrasound of your liver every 6 months. ______

## 2015-02-19 ENCOUNTER — Other Ambulatory Visit: Payer: Self-pay | Admitting: Internal Medicine

## 2015-02-19 ENCOUNTER — Other Ambulatory Visit: Payer: Self-pay

## 2015-02-19 DIAGNOSIS — K7460 Unspecified cirrhosis of liver: Secondary | ICD-10-CM

## 2015-02-19 DIAGNOSIS — K7469 Other cirrhosis of liver: Secondary | ICD-10-CM

## 2015-02-19 NOTE — Progress Notes (Signed)
ON RECALL  °

## 2015-02-23 ENCOUNTER — Ambulatory Visit (HOSPITAL_COMMUNITY)
Admission: RE | Admit: 2015-02-23 | Discharge: 2015-02-23 | Disposition: A | Discharge status: Home / Self Care | Payer: Medicare HMO | Source: Ambulatory Visit | Attending: Internal Medicine | Admitting: Internal Medicine

## 2015-02-23 DIAGNOSIS — R932 Abnormal findings on diagnostic imaging of liver and biliary tract: Secondary | ICD-10-CM | POA: Insufficient documentation

## 2015-02-23 DIAGNOSIS — K7460 Unspecified cirrhosis of liver: Secondary | ICD-10-CM | POA: Diagnosis not present

## 2015-02-23 DIAGNOSIS — K7469 Other cirrhosis of liver: Secondary | ICD-10-CM

## 2015-02-23 NOTE — Progress Notes (Signed)
ON RECALL  °

## 2015-02-27 ENCOUNTER — Encounter: Payer: Self-pay | Admitting: *Deleted

## 2015-03-15 ENCOUNTER — Other Ambulatory Visit: Payer: Self-pay | Admitting: "Endocrinology

## 2015-03-30 ENCOUNTER — Other Ambulatory Visit: Payer: Self-pay

## 2015-03-30 ENCOUNTER — Other Ambulatory Visit: Payer: Self-pay | Admitting: Cardiology

## 2015-03-30 MED ORDER — NADOLOL 40 MG PO TABS: ORAL_TABLET | ORAL | Status: DC

## 2015-04-02 ENCOUNTER — Ambulatory Visit (INDEPENDENT_AMBULATORY_CARE_PROVIDER_SITE_OTHER): Payer: Medicare HMO | Admitting: "Endocrinology

## 2015-04-02 ENCOUNTER — Encounter: Payer: Self-pay | Admitting: "Endocrinology

## 2015-04-02 VITALS — BP 116/73 | HR 69 | Ht 70.0 in | Wt 233.0 lb

## 2015-04-02 DIAGNOSIS — E785 Hyperlipidemia, unspecified: Secondary | ICD-10-CM | POA: Diagnosis not present

## 2015-04-02 DIAGNOSIS — Z794 Long term (current) use of insulin: Secondary | ICD-10-CM

## 2015-04-02 DIAGNOSIS — E1165 Type 2 diabetes mellitus with hyperglycemia: Secondary | ICD-10-CM

## 2015-04-02 DIAGNOSIS — E118 Type 2 diabetes mellitus with unspecified complications: Secondary | ICD-10-CM

## 2015-04-02 DIAGNOSIS — E038 Other specified hypothyroidism: Secondary | ICD-10-CM

## 2015-04-02 DIAGNOSIS — I10 Essential (primary) hypertension: Secondary | ICD-10-CM | POA: Diagnosis not present

## 2015-04-02 DIAGNOSIS — E669 Obesity, unspecified: Secondary | ICD-10-CM | POA: Diagnosis not present

## 2015-04-02 DIAGNOSIS — IMO0002 Reserved for concepts with insufficient information to code with codable children: Secondary | ICD-10-CM

## 2015-04-02 MED ORDER — LEVOTHYROXINE SODIUM 112 MCG PO TABS: 112.0000 ug | ORAL_TABLET | Freq: Every day | ORAL | Status: DC

## 2015-04-02 NOTE — Patient Instructions (Signed)

## 2015-04-03 NOTE — Progress Notes (Signed)
Subjective:    Patient ID: Ralph [name], male    DOB: [birth year], PCP Purvis Kilts, MD   Past Medical History  Diagnosis Date  . Hypertension   . 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 Depressed   . Varicose vein     of esophagus  . CHF (congestive heart failure) ([place]) 01/06/2012  . Sinus pause 01/06/2012    5.2 seconds  . Anginal pain ([place])   . Sleep apnea     "don't wear mask" (01/06/2012)  . Emphysema   . Type II diabetes mellitus ([place])   . GERD (gastroesophageal reflux disease)   . H/O hiatal hernia   . Coughing up blood     "comes from my throat" (01/06/2012)  . Arthritis     "back; fingers" (01/06/2012)  . Chronic lower back pain   . Fatty liver disease, nonalcoholic   . CHB (complete heart block) ([place])   . Presence of permanent cardiac pacemaker 9/292013    [facility]  . Atrial flutter [facility])     s/p EPS +RF ablation of typical atrial flutter April 2015  . Pacemaker   . Stroke [facility])     pt states that he might have had a stroke not sure  . Pneumonia Aug 2016  . Hypothyroidism   . Headache   . DDD (degenerative disc disease), lumbar   . Cancer ([place])     skin cancer  . Difficult intubation     Eschmann stylet used in 2002 and 2007; "trouble waking up afterwards" (01/06/2012)   Past Surgical History  Procedure Laterality Date  . Back surgery    . Spinal cord stimulator implant  2006  . Cholecystectomy  1993  . Nasal septum surgery  1992  . Tonsillectomy and adenoidectomy  1992  . Posterior fusion lumbar spine  1999    L4-5  . Lumbar disc surgery  1994; ~ 1995; ~ 1996  . Warthin's tumor excision  1990's    right  . Permanent pacemaker insertion  01/08/2012    CHB  . US echocardiography  12/28/2011    mild LVH,mild mitral annulara ca+,mild MR  . Nuclear stress test  10/19/2004    No ischemia  . Esophagogastroduodenoscopy  11/08/2004    [id number] esophageal erosions consistent with erosive reflux esophagitis/Areas of hemorrhage and  nodularity of the fundal mucosa of uncertain significance, biopsied.  Small hiatal hernia, otherwise normal stomach  . Colonoscopy  11/08/2004    [id number] rectum, colon, TI.  Marland Kitchen Esophagogastroduodenoscopy  2010    Dr. Gala Romney: 3 columns Grade 1 varices, erosive esophagitis, HH, portal gastropathy, normal D1, D2  . Esophagogastroduodenoscopy (egd) with esophageal dilation N/A 02/14/2013    [id number] esophageal varices. Abnormal distal esophagus/status post biopsy after Maloney dilation. Portal gastropathy. Antral erosions-status post biopsy. path negative for H.pylori, benign path.  . Permanent pacemaker insertion N/A 01/09/2012    Procedure: PERMANENT PACEMAKER INSERTION;  Surgeon: Sanda Klein, MD;  Location: [place] CATH LAB;  Service: Cardiovascular;  Laterality: N/A;  . Atrial flutter ablation N/A 07/10/2013    Procedure: ATRIAL FLUTTER ABLATION;  Surgeon: Evans Lance, MD;  Location[facility] CATH LAB;  Service: Cardiovascular;  Laterality: N/A;  . Colonoscopy N/A 05/28/2014    Dr. Gala Romney: Redundant colon. single colonic polyp removed as described above. Tubular adenoma  . Esophagogastroduodenoscopy N/A 05/28/2014    Dr. Gala Romney: MIld erosive reflux esophagitis. Grade 1 esophageal varices. Patent esophagus. No dilation performed. Hiatal  hernia.   . Esophageal dilation N/A 05/28/2014    Procedure: ESOPHAGEAL DILATION;  Surgeon: Daneil Dolin, MD;  Location: [facility];  Service: Endoscopy;  Laterality: N/A;  . Spinal cord stimulator removal N/A 01/27/2015    Procedure: LUMBAR SPINAL CORD STIMULATOR REMOVAL;  Surgeon: Kristeen Miss, MD;  Location: [place] NEURO ORS;  Service: Neurosurgery;  Laterality: N/A;  LUMBAR SPINAL CORD STIMULATOR REMOVAL   Social History   Social History  . Marital Status: Married    Spouse Name: N/A  . Number of Children: N/A  . Years of Education: N/A   Social History Main Topics  . Smoking status: Current Every Day Smoker -- 0.50 packs/day for 45 years    Types: Cigarettes     Start date: 04/11/1968    Last Attempt to Quit: 03/11/2013  . Smokeless tobacco: Former Systems developer    Quit date: 11/13/2012     Comment: Quit x 8 months this time  . Alcohol Use: No     Comment: "quit alcohol 2011"  . Drug Use: No  . Sexual Activity: Not Currently   Other Topics Concern  . None   Social History Narrative   Outpatient Encounter Prescriptions as of 04/02/2015  Medication Sig  . albuterol (PROVENTIL HFA;VENTOLIN HFA) 108 (90 BASE) MCG/ACT inhaler Inhale 2 puffs into the lungs every 6 (six) hours as needed for wheezing.  [name] ELLIPTA 62.5-25 MCG/INH AEPB INHALE 1 PUFF ONCE DAILY  . buPROPion (WELLBUTRIN SR) 150 MG 12 hr tablet Take 150 mg by mouth 2 (two) times daily.  . Cholecalciferol (VITAMIN D PO) Take 1 tablet by mouth daily.  . Cyanocobalamin (VITAMIN B 12 PO) Take 1 tablet by mouth daily.  . diazepam (VALIUM) 10 MG tablet Take 10 mg by mouth every 6 (six) hours as needed for anxiety.  Marland Kitchen diltiazem (CARDIZEM CD) 240 MG 24 hr capsule TAKE 1 CAPSULE (240 MG TOTAL) BY MOUTH EVERY MORNING.  Marland Kitchen escitalopram (LEXAPRO) 10 MG tablet Take 10 mg by mouth daily.  . flecainide (TAMBOCOR) 100 MG tablet Take 1 tablet (100 mg total) by mouth 2 (two) times daily.  . furosemide (LASIX) 20 MG tablet Take 2 tablets (40 mg total) by mouth daily as needed (swelling).  Marland Kitchen HYDROcodone-acetaminophen (NORCO) 10-325 MG per tablet Take 1 tablet by mouth every 6 (six) hours as needed for pain.  . INVOKANA 300 MG TABS tablet TAKE 1 TABLET EVERY DAY  . LEVEMIR 100 UNIT/ML injection INJECT 70 UNITS SUBCUTANEOUS AT BEDTIME  . levothyroxine (SYNTHROID, LEVOTHROID) 112 MCG tablet Take 1 tablet (112 mcg total) by mouth daily before breakfast.  . metFORMIN (GLUCOPHAGE) 1000 MG tablet TAKE 1 TABLET BY MOUTH TWICE A DAY  . Multiple Vitamin (MULTIVITAMIN) tablet Take 1 tablet by mouth daily.  . nadolol (CORGARD) 40 MG tablet Take 1 tablet (40 mg) in the morning and 1/2 (20 mg) tablet by mouth every  evening  . NOVOLOG FLEXPEN 100 UNIT/ML FlexPen INJECT 10 - 16 UNITS SUBCUTANEOUSLY 3 TIMES A DAY BEFORE MEALS (Patient taking differently: INJECT 5-10  UNITS SUBCUTANEOUSLY 3 TIMES A DAY BEFORE MEALS)  . ondansetron (ZOFRAN) 4 MG tablet Take 1 tablet (4 mg total) by mouth every 8 (eight) hours as needed for nausea or vomiting.  . OXYGEN Inhale 2 L into the lungs at bedtime.  . pantoprazole (PROTONIX) 40 MG tablet Take 1 tablet (40 mg total) by mouth daily.  . rivaroxaban (XARELTO) 20 MG TABS tablet Take 1 tablet (20 mg total) by  mouth daily with supper.  . simvastatin (ZOCOR) 20 MG tablet Take 1 tablet (20 mg total) by mouth every evening.  . [DISCONTINUED] levothyroxine (SYNTHROID, LEVOTHROID) 100 MCG tablet TAKE 1 TABLET BY MOUTH EVERY MORNING (INCREASE IN DOSE)   No facility-administered encounter medications on file as of 04/02/2015.   ALLERGIES: Allergies  Allergen Reactions  . Nitroglycerin Hives, Swelling and Rash   VACCINATION STATUS: Immunization History  Administered Date(s) Administered  . Influenza Split 01/09/2013  . Influenza,inj,Quad PF,36+ Mos 01/09/2014, 12/23/2014  . Pneumococcal Polysaccharide-23 01/10/2012    Diabetes He presents for his follow-up diabetic visit. He has type 2 diabetes mellitus. Onset time: He was diagnosed at approximate age of 38 years. His disease course has been improving. There are no hypoglycemic associated symptoms. Pertinent negatives for hypoglycemia include no confusion, headaches, pallor or seizures. There are no diabetic associated symptoms. Pertinent negatives for diabetes include no chest pain, no fatigue, no polydipsia, no polyphagia, no polyuria and no weakness. There are no hypoglycemic complications. Symptoms are improving. Diabetic complications include a CVA, heart disease and nephropathy. Risk factors for coronary artery disease include diabetes mellitus, dyslipidemia, hypertension, male sex, obesity, sedentary lifestyle and tobacco  exposure. Current diabetic treatment includes intensive insulin program. He is compliant with treatment most of the time. His weight is stable. He is following a diabetic diet. When asked about meal planning, he reported none. He participates in exercise intermittently. There is no change in his home blood glucose trend. His breakfast blood glucose range is generally 140-180 mg/dl. His lunch blood glucose range is generally 140-180 mg/dl. His dinner blood glucose range is generally 140-180 mg/dl. His overall blood glucose range is 140-180 mg/dl.  Hyperlipidemia This is a chronic problem. The current episode started more than 1 year ago. Pertinent negatives include no chest pain, myalgias or shortness of breath. Current antihyperlipidemic treatment includes statins. Risk factors for coronary artery disease include dyslipidemia, diabetes mellitus, hypertension, male sex and a sedentary lifestyle.  Hypertension This is a chronic problem. The current episode started more than 1 year ago. Pertinent negatives include no chest pain, headaches, neck pain, palpitations or shortness of breath. Risk factors for coronary artery disease include diabetes mellitus, dyslipidemia, obesity, sedentary lifestyle and smoking/tobacco exposure. Compliance problems include diet.  Hypertensive end-organ damage includes CVA and a thyroid problem.  Thyroid Problem Presents for follow-up visit. Patient reports no cold intolerance, constipation, diarrhea, fatigue, heat intolerance or palpitations. The symptoms have been improving. Past treatments include levothyroxine. His past medical history is significant for hyperlipidemia.     Review of Systems  Constitutional: Negative for fatigue and unexpected weight change.  HENT: Negative for dental problem, mouth sores and trouble swallowing.   Eyes: Negative for visual disturbance.  Respiratory: Negative for cough, choking, chest tightness, shortness of breath and wheezing.    Cardiovascular: Negative for chest pain, palpitations and leg swelling.  Gastrointestinal: Negative for nausea, vomiting, abdominal pain, diarrhea, constipation and abdominal distention.  Endocrine: Negative for cold intolerance, heat intolerance, polydipsia, polyphagia and polyuria.  Genitourinary: Negative for dysuria, urgency, hematuria and flank pain.  Musculoskeletal: Negative for myalgias, back pain, gait problem and neck pain.  Skin: Negative for pallor, rash and wound.  Neurological: Negative for seizures, syncope, weakness, numbness and headaches.  Psychiatric/Behavioral: Negative.  Negative for confusion and dysphoric mood.    Objective:    BP 116/73 mmHg  Pulse 69  Ht 5\' 10"  (1.778 m)  Wt 233 lb (105.688 kg)  BMI 33.43 kg/m2  SpO2 92%  Wt Readings from Last 3 Encounters:  04/02/15 233 lb (105.688 kg)  01/27/15 224 lb (101.606 kg)  01/22/15 224 lb 6.4 oz (101.787 kg)    Physical Exam  Constitutional: He is oriented to person, place, and time. He appears well-developed and well-nourished. He is cooperative. No distress.  HENT:  Head: Normocephalic and atraumatic.  Eyes: EOM are normal.  Neck: Normal range of motion. Neck supple. No tracheal deviation present. No thyromegaly present.  Cardiovascular: Normal rate, S1 normal, S2 normal and normal heart sounds.  Exam reveals no gallop.   No murmur heard. Pulses:      Dorsalis pedis pulses are 1+ on the right side, and 1+ on the left side.       Posterior tibial pulses are 1+ on the right side, and 1+ on the left side.  Pulmonary/Chest: Breath sounds normal. No respiratory distress. He has no wheezes.  Abdominal: Soft. Bowel sounds are normal. He exhibits no distension. There is no tenderness. There is no guarding and no CVA tenderness.  Musculoskeletal: He exhibits no edema.       Right shoulder: He exhibits no swelling and no deformity.  Neurological: He is alert and oriented to person, place, and time. He has normal  strength and normal reflexes. No cranial nerve deficit or sensory deficit. Gait normal.  Skin: Skin is warm and dry. No rash noted. No cyanosis. Nails show no clubbing.  Psychiatric: He has a normal mood and affect. His speech is normal and behavior is normal. Judgment and thought content normal. Cognition and memory are normal.    CMP     Component Value Date/Time   NA 140 01/22/2015 1055   K 4.8 01/22/2015 1055   CL 102 01/22/2015 1055   CO2 29 01/22/2015 1055   GLUCOSE 114* 01/22/2015 1055   BUN 31* 01/22/2015 1055   CREATININE 1.00 01/22/2015 1055   CREATININE 1.10 07/05/2013 1147   CALCIUM 10.0 01/22/2015 1055   PROT 6.7 02/16/2015 0721   ALBUMIN 4.2 02/16/2015 0721   ALBUMIN 3.3 12/17/2012   AST 23 02/16/2015 0721   AST 23 12/17/2012   ALT 34 02/16/2015 0721   ALKPHOS 151* 02/16/2015 0721   ALKPHOS 423 12/17/2012   BILITOT 0.5 02/16/2015 0721   BILITOT 0.7 12/17/2012   GFRNONAA >60 01/22/2015 1055   GFRAA >60 01/22/2015 1055   Diabetic Labs (most recent): Lab Results  Component Value Date   HGBA1C 6.8* 01/22/2015   HGBA1C 7.9* 06/07/2013   HGBA1C 6.5* 11/20/2012   Most Recent Lipid Panel Lipid Panel     Component Value Date/Time   CHOL 118 05/30/2014 0709   TRIG 166* 05/30/2014 0709   HDL 40 05/30/2014 0709   CHOLHDL 3.0 05/30/2014 0709   VLDL 33 05/30/2014 0709   LDLCALC 45 05/30/2014 0709     Assessment & Plan:   1. Uncontrolled type 2 diabetes mellitus with complication, with long-term current use of insulin (HCC)  - His diabetes is  complicated by coronary artery disease/cardiomyopathy, cerebrovascular accident, and patient remains at a high risk for more acute and chronic complications of diabetes which include CAD, CVA, CKD, retinopathy, and neuropathy. These are all discussed in detail with the patient.  Patient came with controlled glucose profile, and  recent A1c of 6.8 %.  Glucose logs and insulin administration records pertaining to this visit,   to be scanned into patient's records.  Recent labs reviewed.   - I have re-counseled the patient on diet management and weight  loss  by adopting a carbohydrate restricted / protein rich  Diet.  - Suggestion is made for patient to avoid simple carbohydrates   from their diet including Cakes , Desserts, Ice Cream,  Soda (  diet and regular) , Sweet Tea , Candies,  Chips, Cookies, Artificial Sweeteners,   and "Sugar-free" Products .  This will help patient to have stable blood glucose profile and potentially avoid unintended  Weight gain.  - Patient is advised to stick to a routine mealtimes to eat 3 meals  a day and avoid unnecessary snacks ( to snack only to correct hypoglycemia).  - The patient  has been  scheduled with Jearld Fenton, RDN, CDE for individualized DM education.  - I have approached patient with the following individualized plan to manage diabetes and patient agrees.  -Continue Levemir 70 units qhs, Nvolog 5 units TIDAC plus correction dose associated with monitoring of BG ac and hs. he has a chance to come off of Novolog if he continues to improve.  -Adjustment parameters for hypo and hyperglycemia were given in a written document to patient.  -Patient is encouraged to call clinic for blood glucose levels less than 70 or above 300 mg /dl.  -I will continue Metformin 1000mg  po BID, add Invokana 300mg  po qam, SE discussed with patient.   - Patient specific target  for A1c; LDL, HDL, Triglycerides, and  Waist Circumference were discussed in detail.  2) BP/HTN: Controlled. Continue current medications . 3) Lipids/HPL:  continue statins. 4)  Weight/Diet: CDE consult in progress, exercise, and carbohydrates information provided. 5)  hypothyroidism -I will increase his levothyroxine to 112 g by mouth every morning.  - We discussed about correct intake of levothyroxine, at fasting, with water, separated by at least 30 minutes from breakfast, and separated by more than 4 hours  from calcium, iron, multivitamins, acid reflux medications (PPIs). -Patient is made aware of the fact that thyroid hormone replacement is needed for life, dose to be adjusted by periodic monitoring of thyroid function tests.   6) Chronic Care/Health Maintenance:  -Patient  is Statin medications and encouraged to continue to follow up with Ophthalmology, Podiatrist at least yearly or according to recommendations, and advised to quit smoking. I have recommended yearly flu vaccine and pneumonia vaccination at least every 5 years; moderate intensity exercise for up to 150 minutes weekly; and  sleep for at least 7 hours a day.  - 25 minutes of time was spent on the care of this patient , 50% of which was applied for counseling on diabetes complications and their preventions.  - I advised patient to maintain close follow up with Purvis Kilts, MD for primary care needs.  Patient is asked to bring meter and  blood glucose logs during their next visit.   Follow up plan: -Return in about 3 months (around 07/01/2015) for diabetes, high blood pressure, high cholesterol, underactive thyroid, follow up with pre-visit labs, meter, and logs.  Glade Lloyd, MD Phone: [phone]  Fax: [phone]   04/03/2015, 11:13 AM

## 2015-04-07 ENCOUNTER — Other Ambulatory Visit: Payer: Self-pay

## 2015-04-07 ENCOUNTER — Other Ambulatory Visit: Payer: Self-pay | Admitting: Orthopedic Surgery

## 2015-04-07 DIAGNOSIS — M25551 Pain in right hip: Secondary | ICD-10-CM

## 2015-04-07 MED ORDER — FLECAINIDE ACETATE 100 MG PO TABS: 100.0000 mg | ORAL_TABLET | Freq: Two times a day (BID) | ORAL | Status: DC

## 2015-04-14 ENCOUNTER — Other Ambulatory Visit: Payer: Medicare HMO

## 2015-04-21 ENCOUNTER — Ambulatory Visit
Admission: RE | Admit: 2015-04-21 | Discharge: 2015-04-21 | Disposition: A | Discharge status: Home / Self Care | Payer: Medicare HMO | Source: Ambulatory Visit | Attending: Orthopedic Surgery | Admitting: Orthopedic Surgery

## 2015-04-21 DIAGNOSIS — M25551 Pain in right hip: Secondary | ICD-10-CM

## 2015-04-21 MED ORDER — IOHEXOL 180 MG/ML  SOLN
15.0000 mL | Freq: Once | INTRAMUSCULAR | Status: AC | PRN
  Administered 2015-04-21: 15 mL via INTRAVENOUS

## 2015-04-23 ENCOUNTER — Encounter: Payer: Self-pay | Admitting: Cardiology

## 2015-04-23 ENCOUNTER — Ambulatory Visit (INDEPENDENT_AMBULATORY_CARE_PROVIDER_SITE_OTHER): Payer: Self-pay | Admitting: Cardiology

## 2015-04-23 VITALS — BP 118/80 | HR 72 | Ht 68.0 in | Wt 236.0 lb

## 2015-04-23 DIAGNOSIS — K055 Other periodontal diseases: Secondary | ICD-10-CM

## 2015-04-23 DIAGNOSIS — I4891 Unspecified atrial fibrillation: Secondary | ICD-10-CM

## 2015-04-23 DIAGNOSIS — R42 Dizziness and giddiness: Secondary | ICD-10-CM

## 2015-04-23 DIAGNOSIS — E785 Hyperlipidemia, unspecified: Secondary | ICD-10-CM

## 2015-04-23 DIAGNOSIS — I10 Essential (primary) hypertension: Secondary | ICD-10-CM

## 2015-04-23 DIAGNOSIS — K068 Other specified disorders of gingiva and edentulous alveolar ridge: Secondary | ICD-10-CM

## 2015-04-23 LAB — CBC
HCT: 49.3 % (ref 39.0–52.0)
Hemoglobin: 16.6 g/dL (ref 13.0–17.0)
MCH: 32.2 pg (ref 26.0–34.0)
MCHC: 33.7 g/dL (ref 30.0–36.0)
MCV: 95.7 fL (ref 78.0–100.0)
MPV: 9.5 fL (ref 8.6–12.4)
PLATELETS: 249 10*3/uL (ref 150–400)
RBC: 5.15 MIL/uL (ref 4.22–5.81)
RDW: 13.2 % (ref 11.5–15.5)
WBC: 9 10*3/uL (ref 4.0–10.5)

## 2015-04-23 LAB — COMPREHENSIVE METABOLIC PANEL
ALBUMIN: 4.6 g/dL (ref 3.6–5.1)
ALK PHOS: 116 U/L — AB (ref 40–115)
ALT: 35 U/L (ref 9–46)
AST: 23 U/L (ref 10–35)
BILIRUBIN TOTAL: 0.7 mg/dL (ref 0.2–1.2)
BUN: 32 mg/dL — ABNORMAL HIGH (ref 7–25)
CALCIUM: 9.7 mg/dL (ref 8.6–10.3)
CO2: 29 mmol/L (ref 20–31)
CREATININE: 0.95 mg/dL (ref 0.70–1.25)
Chloride: 98 mmol/L (ref 98–110)
GLUCOSE: 97 mg/dL (ref 65–99)
Potassium: 5 mmol/L (ref 3.5–5.3)
SODIUM: 138 mmol/L (ref 135–146)
Total Protein: 7.5 g/dL (ref 6.1–8.1)

## 2015-04-23 LAB — PROTIME-INR
INR: 1.17 (ref <1.50)
PROTHROMBIN TIME: 15 s (ref 11.6–15.2)

## 2015-04-23 MED ORDER — DILTIAZEM HCL ER COATED BEADS 180 MG PO CP24: 180.0000 mg | ORAL_CAPSULE | Freq: Every day | ORAL | Status: DC

## 2015-04-23 NOTE — Progress Notes (Signed)
Patient ID: Ralph [name], male   DOB: 05-12-1950, 65 y.o.   MRN: [id number]     Clinical Summary Ralph [name] is a 65 y.o.male seen today for follow up of the following medical problems.  1. Bradycardia/sinus arrest  - Ralph Jude dual chamber pacemaker pacemaker implanted Sept 2013 ([facility]).  - normal device check 10/2014, though did show some AF burden.  - continues to have some orhtostatic dizziness, but no other symptoms.      2. Afib/aflutter - previous side effects on multaq - seen by EP 06/28/13, started on flecanide. Continued to have symptoms. Had RF ablation of flutter by Ralph [name] 07/10/13. 10/2014 device check did show some AF burden - on nadolol for [name] of palpitations as well as esoph varices  - denies any palpitations. Compliant with meds. Bleeding gum episode this morning after waking up, first time this has happened to him.    3. HTN  - compliant w/ meds    4. HL  - compliant with statin - no recent panel in our system  5. NASH cirrhosis  - followed by GI  - historyof grade I varices, no [name] of bleeding on anticoag    6. COPD  - compliant with inhalers and home oxygen. - followed by pulmonary  7. Orthostatic dizziness - continues to have dizziness with standing. Did not significantly improve with changing his lasix to prn.   8. DM2 - followed Ralph [name]  Diagnosis Date  . Hypertension   . Cirrhosis ([place])     NASH-Hep A and B immune  . Depressed   . Varicose vein     of esophagus  . CHF (congestive heart failure) ([place]) 01/06/2012  . Sinus pause 01/06/2012    5.2 seconds  . Anginal pain ([place])   . Sleep apnea     "don't wear mask" (01/06/2012)  . Emphysema   . Type II diabetes mellitus ([place])   . GERD (gastroesophageal reflux disease)   . H/O hiatal hernia   . Coughing up blood     "comes from my throat" (01/06/2012)  . Arthritis     "back; fingers" (01/06/2012)  . Chronic lower back pain   .  Fatty liver disease, nonalcoholic   . CHB (complete heart block) ([place])   . Presence of permanent cardiac pacemaker 9/292013    Ralph.Jude  . Atrial flutter Ralph [name])     s/p EPS +RF ablation of typical atrial flutter April 2015  . Pacemaker   . Stroke [facility])     pt states that he might have had a stroke not sure  . Pneumonia Aug 2016  . Hypothyroidism   . Headache   . DDD (degenerative disc disease), lumbar   . Cancer ([place])     skin cancer  . Difficult intubation     Eschmann stylet used in 2002 and 2007; "trouble waking up afterwards" (01/06/2012)     Allergies  Allergen Reactions  . Nitroglycerin Hives, Swelling and Rash     Current Outpatient Prescriptions  Medication Sig Dispense Refill  . albuterol (PROVENTIL HFA;VENTOLIN HFA) 108 (90 BASE) MCG/ACT inhaler Inhale 2 puffs into the lungs every 6 (six) hours as needed for wheezing.    Ralph [name] ELLIPTA 62.5-25 MCG/INH AEPB INHALE 1 PUFF ONCE DAILY 180 each 3  . buPROPion (WELLBUTRIN SR) 150 MG 12 hr tablet Take 150 mg by mouth 2 (two) times daily.    . Cholecalciferol (VITAMIN D PO)  Take 1 tablet by mouth daily.    . Cyanocobalamin (VITAMIN B 12 PO) Take 1 tablet by mouth daily.    . diazepam (VALIUM) 10 MG tablet Take 10 mg by mouth every 6 (six) hours as needed for anxiety.    Marland Kitchen diltiazem (CARDIZEM CD) 240 MG 24 hr capsule TAKE 1 CAPSULE (240 MG TOTAL) BY MOUTH EVERY MORNING. 90 capsule 3  . escitalopram (LEXAPRO) 10 MG tablet Take 10 mg by mouth daily.    . flecainide (TAMBOCOR) 100 MG tablet Take 1 tablet (100 mg total) by mouth 2 (two) times daily. 180 tablet 1  . furosemide (LASIX) 20 MG tablet Take 2 tablets (40 mg total) by mouth daily as needed (swelling). 60 tablet 0  . HYDROcodone-acetaminophen (NORCO) 10-325 MG per tablet Take 1 tablet by mouth every 6 (six) hours as needed for pain.    . INVOKANA 300 MG TABS tablet TAKE 1 TABLET EVERY DAY 90 tablet 0  . LEVEMIR 100 UNIT/ML injection INJECT 70 UNITS SUBCUTANEOUS AT BEDTIME  70 mL 2  . levothyroxine (SYNTHROID, LEVOTHROID) 112 MCG tablet Take 1 tablet (112 mcg total) by mouth daily before breakfast. 30 tablet 2  . metFORMIN (GLUCOPHAGE) 1000 MG tablet TAKE 1 TABLET BY MOUTH TWICE A DAY 180 tablet 0  . Multiple Vitamin (MULTIVITAMIN) tablet Take 1 tablet by mouth daily.    . nadolol (CORGARD) 40 MG tablet Take 1 tablet (40 mg) in the morning and 1/2 (20 mg) tablet by mouth every evening 30 tablet 3  . NOVOLOG FLEXPEN 100 UNIT/ML FlexPen INJECT 10 - 16 UNITS SUBCUTANEOUSLY 3 TIMES A DAY BEFORE MEALS (Patient taking differently: INJECT 5-10  UNITS SUBCUTANEOUSLY 3 TIMES A DAY BEFORE MEALS) 15 mL 2  . ondansetron (ZOFRAN) 4 MG tablet Take 1 tablet (4 mg total) by mouth every 8 (eight) hours as needed for nausea or vomiting. 40 tablet 0  . OXYGEN Inhale 2 L into the lungs at bedtime.    . pantoprazole (PROTONIX) 40 MG tablet Take 1 tablet (40 mg total) by mouth daily. 90 tablet 5  . rivaroxaban (XARELTO) 20 MG TABS tablet Take 1 tablet (20 mg total) by mouth daily with supper. 90 tablet 3  . simvastatin (ZOCOR) 20 MG tablet Take 1 tablet (20 mg total) by mouth every evening. 90 tablet 3   No current facility-administered medications for this visit.     Past Surgical [name]  Procedure Laterality Date  . Back surgery    . Spinal cord stimulator implant  2006  . Cholecystectomy  1993  . Nasal septum surgery  1992  . Tonsillectomy and adenoidectomy  1992  . Posterior fusion lumbar spine  1999    L4-5  . Lumbar disc surgery  1994; ~ 1995; ~ 1996  . Warthin's tumor excision  1990's    right  . Permanent pacemaker insertion  01/08/2012    CHB  . US echocardiography  12/28/2011    mild LVH,mild mitral annulara ca+,mild MR  . Nuclear stress test  10/19/2004    No ischemia  . Esophagogastroduodenoscopy  11/08/2004    [id number] esophageal erosions consistent with erosive reflux esophagitis/Areas of hemorrhage and nodularity of the fundal mucosa of uncertain  significance, biopsied.  Small hiatal hernia, otherwise normal stomach  . Colonoscopy  11/08/2004    [id number] rectum, colon, TI.  Marland Kitchen Esophagogastroduodenoscopy  2010    Ralph. Gala Romney: 3 columns Grade 1 varices, erosive esophagitis, HH, portal gastropathy, normal D1, D2  . Esophagogastroduodenoscopy (egd)  with esophageal dilation N/A 02/14/2013    [id number] esophageal varices. Abnormal distal esophagus/status post biopsy after Maloney dilation. Portal gastropathy. Antral erosions-status post biopsy. path negative for H.pylori, benign path.  . Permanent pacemaker insertion N/A 01/09/2012    Procedure: PERMANENT PACEMAKER INSERTION;  Surgeon: Sanda Klein, MD;  Location: [facility] CATH LAB;  Service: Cardiovascular;  Laterality: N/A;  . Atrial flutter ablation N/A 07/10/2013    Procedure: ATRIAL FLUTTER ABLATION;  Surgeon: Evans Lance, MD;  Location: [name] CATH [facility];  Service: Cardiovascular;  Laterality: N/A;  . Colonoscopy N/A 05/28/2014    Ralph. Gala Romney: Redundant colon. single colonic polyp removed as described above. Tubular adenoma  . Esophagogastroduodenoscopy N/A 05/28/2014    Ralph. Gala Romney: MIld erosive reflux esophagitis. Grade 1 esophageal varices. Patent esophagus. No dilation performed. Hiatal hernia.   . Esophageal dilation N/A 05/28/2014    Procedure: ESOPHAGEAL DILATION;  Surgeon: Daneil Dolin, MD;  [facility];  Service: Endoscopy;  Laterality: N/A;  . Spinal cord stimulator removal N/A 01/27/2015    Procedure: LUMBAR SPINAL CORD STIMULATOR REMOVAL;  Surgeon: Kristeen Miss, MD;  Location: [facility] NEURO ORS;  Service: Neurosurgery;  Laterality: N/A;  LUMBAR SPINAL CORD STIMULATOR REMOVAL     Allergies  Allergen Reactions  . Nitroglycerin Hives, Swelling and Rash      Family [name]  Problem Relation Age of Onset  . Stroke Brother   . Cancer Mother   . Arrhythmia Father      Social [name] Mr. Stogdill reports that he has been smoking Cigarettes.  He started smoking about 47 years  ago. He has a 22.5 pack-year smoking [name]. He quit smokeless tobacco use about 2 years ago. Mr. Musselman reports that he does not drink alcohol.   Review of Systems CONSTITUTIONAL: No weight loss, fever, chills, weakness or fatigue.  HEENT: Eyes: No visual loss, blurred vision, double vision or yellow sclerae.No hearing loss, sneezing, congestion, runny nose or sore throat.  SKIN: No rash or itching.  CARDIOVASCULAR: per HPI RESPIRATORY: No shortness of breath, cough or sputum.  GASTROINTESTINAL: No anorexia, nausea, vomiting or diarrhea. No abdominal pain or blood.  GENITOURINARY: No burning on urination, no polyuria NEUROLOGICAL: No headache, dizziness, syncope, paralysis, ataxia, numbness or tingling in the extremities. No change in bowel or bladder control.  MUSCULOSKELETAL: No muscle, back pain, joint pain or stiffness.  LYMPHATICS: No enlarged nodes. No [name] of splenectomy.  PSYCHIATRIC: No [name] of depression or anxiety.  ENDOCRINOLOGIC: No reports of sweating, cold or heat intolerance. No polyuria or polydipsia.  Marland Kitchen   Physical Examination Filed Vitals:   04/23/15 0909  BP: 118/80  Pulse: 72   Filed Vitals:   04/23/15 0909  Height: 5\' 8"  (1.727 m)  Weight: 236 lb (107.049 kg)    Gen: resting comfortably, no acute distress HEENT: no scleral icterus, pupils equal round and reactive, no palptable cervical adenopathy,  CV: RRR, no m/r/g, no jvd Resp: Clear to auscultation bilaterally GI: abdomen is soft, non-tender, non-distended, normal bowel sounds, no hepatosplenomegaly MSK: extremities are warm, no edema.  Skin: warm, no rash Neuro:  no focal deficits Psych: appropriate affect   Diagnostic Studies Jan 2014 Myoview: no ischemia   11/2012 Echo: LVEF 60-65%, mild LVH, moderate basal septal hypertrophy, mild LAE    Assessment and Plan  1. Afib/Aflutter - no current symptoms since recent aflutter ablation. Pacemaker check did show some AF, we will continue  flecanide and anticoagulation. No current symptoms.  - episode of gum bleeding  this AM. We will check his kidney and liver function, INR, and CBC to see if any particular reason this occurred while on xarelto. Will continue xarelto for now, asked to contact us if symptom reoccur.   2. HTN:  - at goal,continue current meds   3. HL  - followed by PCP. Reports PCP is watching his LFTs closely due to an increase and [name] of NASH, will defer management to PCP   4. Sinus arrest  - normal pacemaker check on last visit  5. COPD - per pulmonary  6. Orthostatic dizziness - we will decrease his dilt to 180mg  daily and follow his symptoms.    F/u 3 months      Arnoldo Lenis, [name]

## 2015-04-23 NOTE — Patient Instructions (Signed)
Your physician recommends that you schedule a follow-up appointment in: 3 months with Dr Harl Bowie   Please make missed apt with device clinic    Your physician recommends that you return for lab work in: cmet,cbc,INR   DECREASE Cardizem to 180 mg daily   If you need a refill on your cardiac medications before your next appointment, please call your pharmacy.   Thank you for choosing [facility] !

## 2015-04-30 ENCOUNTER — Other Ambulatory Visit: Payer: Self-pay | Admitting: Cardiology

## 2015-05-08 ENCOUNTER — Encounter: Payer: Self-pay | Admitting: Internal Medicine

## 2015-05-08 ENCOUNTER — Ambulatory Visit (INDEPENDENT_AMBULATORY_CARE_PROVIDER_SITE_OTHER): Payer: Medicare HMO | Admitting: *Deleted

## 2015-05-08 DIAGNOSIS — I4891 Unspecified atrial fibrillation: Secondary | ICD-10-CM | POA: Diagnosis not present

## 2015-05-08 DIAGNOSIS — I442 Atrioventricular block, complete: Secondary | ICD-10-CM | POA: Diagnosis not present

## 2015-05-08 LAB — CUP PACEART INCLINIC DEVICE CHECK
Battery Voltage: 2.93 V
Brady Statistic RA Percent Paced: 99.43 %
Date Time Interrogation Session: 20170127143323
Implantable Lead Implant Date: 20130920
Lead Channel Pacing Threshold Amplitude: 0.75 V
Lead Channel Pacing Threshold Amplitude: 1.25 V
Lead Channel Pacing Threshold Amplitude: 1.25 V
Lead Channel Sensing Intrinsic Amplitude: 1.2 mV
Lead Channel Setting Pacing Amplitude: 2.5 V
MDC IDC LEAD IMPLANT DT: 20130920
MDC IDC LEAD LOCATION: 753859
MDC IDC LEAD LOCATION: 753860
MDC IDC MSMT BATTERY REMAINING LONGEVITY: 100.8
MDC IDC MSMT LEADCHNL RA IMPEDANCE VALUE: 437.5 Ohm
MDC IDC MSMT LEADCHNL RA PACING THRESHOLD AMPLITUDE: 0.75 V
MDC IDC MSMT LEADCHNL RA PACING THRESHOLD PULSEWIDTH: 0.4 ms
MDC IDC MSMT LEADCHNL RA PACING THRESHOLD PULSEWIDTH: 0.4 ms
MDC IDC MSMT LEADCHNL RV IMPEDANCE VALUE: 375 Ohm
MDC IDC MSMT LEADCHNL RV PACING THRESHOLD PULSEWIDTH: 0.4 ms
MDC IDC MSMT LEADCHNL RV PACING THRESHOLD PULSEWIDTH: 0.4 ms
MDC IDC MSMT LEADCHNL RV SENSING INTR AMPL: 2.9 mV
MDC IDC SET LEADCHNL RA PACING AMPLITUDE: 2 V
MDC IDC SET LEADCHNL RV PACING PULSEWIDTH: 0.4 ms
MDC IDC SET LEADCHNL RV SENSING SENSITIVITY: 1 mV
MDC IDC STAT BRADY RV PERCENT PACED: 0.39 %
Pulse Gen Model: 2210
Pulse Gen Serial Number: 7393982

## 2015-05-08 NOTE — Progress Notes (Signed)
Pacemaker check in clinic. Normal device function. Thresholds, sensing, impedances consistent with previous measurements. Device programmed to maximize longevity. (9) mode switches (<1%)---max dur. 10 sec, Max A 404---sensor. No high ventricular rates noted. (1) A noise reversion---stable impedance---2088 lead. Device programmed at appropriate safety margins. Histogram distribution appropriate for patient activity level. Device programmed to optimize intrinsic conduction. Estimated longevity 7.3-8.4 years. Patient will follow up with GT/R in 6 months

## 2015-05-13 DIAGNOSIS — R69 Illness, unspecified: Secondary | ICD-10-CM | POA: Diagnosis not present

## 2015-05-14 DIAGNOSIS — L259 Unspecified contact dermatitis, unspecified cause: Secondary | ICD-10-CM | POA: Diagnosis not present

## 2015-05-25 ENCOUNTER — Other Ambulatory Visit: Payer: Self-pay

## 2015-05-25 DIAGNOSIS — K7460 Unspecified cirrhosis of liver: Secondary | ICD-10-CM

## 2015-05-26 DIAGNOSIS — I4891 Unspecified atrial fibrillation: Secondary | ICD-10-CM | POA: Diagnosis not present

## 2015-05-26 DIAGNOSIS — J9610 Chronic respiratory failure, unspecified whether with hypoxia or hypercapnia: Secondary | ICD-10-CM | POA: Diagnosis not present

## 2015-05-26 DIAGNOSIS — J449 Chronic obstructive pulmonary disease, unspecified: Secondary | ICD-10-CM | POA: Diagnosis not present

## 2015-05-26 DIAGNOSIS — I5030 Unspecified diastolic (congestive) heart failure: Secondary | ICD-10-CM | POA: Diagnosis not present

## 2015-05-27 ENCOUNTER — Encounter: Payer: Self-pay | Admitting: Internal Medicine

## 2015-05-31 ENCOUNTER — Other Ambulatory Visit: Payer: Self-pay | Admitting: Cardiology

## 2015-06-04 ENCOUNTER — Other Ambulatory Visit: Payer: Self-pay

## 2015-06-04 ENCOUNTER — Other Ambulatory Visit: Payer: Self-pay | Admitting: "Endocrinology

## 2015-06-04 MED ORDER — NADOLOL 40 MG PO TABS: ORAL_TABLET | ORAL | Status: DC

## 2015-06-05 DIAGNOSIS — K7460 Unspecified cirrhosis of liver: Secondary | ICD-10-CM | POA: Diagnosis not present

## 2015-06-06 LAB — HEPATIC FUNCTION PANEL
ALBUMIN: 4.1 g/dL (ref 3.6–5.1)
ALK PHOS: 114 U/L (ref 40–115)
ALT: 30 U/L (ref 9–46)
AST: 19 U/L (ref 10–35)
Bilirubin, Direct: 0.1 mg/dL (ref <=0.2)
Indirect Bilirubin: 0.3 mg/dL (ref 0.2–1.2)
TOTAL PROTEIN: 6.8 g/dL (ref 6.1–8.1)
Total Bilirubin: 0.4 mg/dL (ref 0.2–1.2)

## 2015-06-08 DIAGNOSIS — R69 Illness, unspecified: Secondary | ICD-10-CM | POA: Diagnosis not present

## 2015-06-09 DIAGNOSIS — M1611 Unilateral primary osteoarthritis, right hip: Secondary | ICD-10-CM | POA: Diagnosis not present

## 2015-06-09 DIAGNOSIS — L4050 Arthropathic psoriasis, unspecified: Secondary | ICD-10-CM | POA: Diagnosis not present

## 2015-06-12 ENCOUNTER — Other Ambulatory Visit: Payer: Self-pay | Admitting: "Endocrinology

## 2015-06-15 DIAGNOSIS — L400 Psoriasis vulgaris: Secondary | ICD-10-CM | POA: Diagnosis not present

## 2015-06-16 ENCOUNTER — Ambulatory Visit (INDEPENDENT_AMBULATORY_CARE_PROVIDER_SITE_OTHER): Payer: Medicare HMO | Admitting: Internal Medicine

## 2015-06-16 ENCOUNTER — Encounter: Payer: Self-pay | Admitting: Internal Medicine

## 2015-06-16 VITALS — BP 133/70 | HR 89 | Temp 97.7°F | Ht 70.0 in | Wt 243.8 lb

## 2015-06-16 DIAGNOSIS — I8500 Esophageal varices without bleeding: Secondary | ICD-10-CM

## 2015-06-16 DIAGNOSIS — K7469 Other cirrhosis of liver: Secondary | ICD-10-CM

## 2015-06-16 DIAGNOSIS — K219 Gastro-esophageal reflux disease without esophagitis: Secondary | ICD-10-CM | POA: Diagnosis not present

## 2015-06-16 MED ORDER — NADOLOL 40 MG PO TABS: ORAL_TABLET | ORAL | Status: DC

## 2015-06-16 NOTE — Progress Notes (Signed)
Primary Care Physician:  Purvis Kilts, MD Primary Gastroenterologist:  Dr. Gala Romney  Pre-Procedure History & Physical: HPI:  Ralph [name] is a 65 y.o. male here for followup of Ash/Nash cirrhosis. Recent ultrasound negative for hepatoma;  LFTs completely normal. Dr. Harl Bowie taken him off of furosemide. He has gained about 20 pounds since I last saw him. Some of this may be adipose. He continues on nadolol for primary prophylaxis given the fact that he has esophageal varices. Has intermittent Pharyngeal phas dysphagia previously evaluated. No obstruction to passage of barium pill previously. Reflux symptoms well controlled on Protonix 40 mg daily;  History of colonic adenoma removed last year; due surveillance examination 2021  Past Medical History  Diagnosis Date  . Hypertension   . Cirrhosis ([place])     NASH-Hep A and B immune  . Depressed   . Varicose vein     of esophagus  . CHF (congestive heart failure) ([place]) 01/06/2012  . Sinus pause 01/06/2012    5.2 seconds  . Anginal pain ([place])   . Sleep apnea     "don't wear mask" (01/06/2012)  . Emphysema   . Type II diabetes mellitus ([place])   . GERD (gastroesophageal reflux disease)   . H/O hiatal hernia   . Coughing up blood     "comes from my throat" (01/06/2012)  . Arthritis     "back; fingers" (01/06/2012)  . Chronic lower back pain   . Fatty liver disease, nonalcoholic   . CHB (complete heart block) ([place])   . Presence of permanent cardiac pacemaker 9/292013    St.Jude  . Atrial flutter [facility])     s/p EPS +RF ablation of typical atrial flutter April 2015  . Pacemaker   . Stroke [facility])     pt states that he might have had a stroke not sure  . Pneumonia Aug 2016  . Hypothyroidism   . Headache   . DDD (degenerative disc disease), lumbar   . Cancer ([place])     skin cancer  . Difficult intubation     Eschmann stylet used in 2002 and 2007; "trouble waking up afterwards" (01/06/2012)    Past Surgical History  Procedure  Laterality Date  . Back surgery    . Spinal cord stimulator implant  2006  . Cholecystectomy  1993  . Nasal septum surgery  1992  . Tonsillectomy and adenoidectomy  1992  . Posterior fusion lumbar spine  1999    L4-5  . Lumbar disc surgery  1994; ~ 1995; ~ 1996  . Warthin's tumor excision  1990's    right  . Permanent pacemaker insertion  01/08/2012    CHB  . US echocardiography  12/28/2011    mild LVH,mild mitral annulara ca+,mild MR  . Nuclear stress test  10/19/2004    No ischemia  . Esophagogastroduodenoscopy  11/08/2004    [id number] esophageal erosions consistent with erosive reflux esophagitis/Areas of hemorrhage and nodularity of the fundal mucosa of uncertain significance, biopsied.  Small hiatal hernia, otherwise normal stomach  . Colonoscopy  11/08/2004    [id number] rectum, colon, TI.  Marland Kitchen Esophagogastroduodenoscopy  2010    Dr. Gala Romney: 3 columns Grade 1 varices, erosive esophagitis, HH, portal gastropathy, normal D1, D2  . Esophagogastroduodenoscopy (egd) with esophageal dilation N/A 02/14/2013    [id number] 1 esophageal varices. Abnormal distal esophagus/status post biopsy after Maloney dilation. Portal gastropathy. Antral erosions-status post biopsy. path negative for H.pylori, benign path.  . Permanent pacemaker insertion N/A 01/09/2012  Procedure: PERMANENT PACEMAKER INSERTION;  Surgeon: Sanda Klein, MD;  Location: [facility] CATH LAB;  Service: Cardiovascular;  Laterality: N/A;  . Atrial flutter ablation N/A 07/10/2013    Procedure: ATRIAL FLUTTER ABLATION;  Surgeon: Evans Lance, MD;  Location: [facility] CATH LAB;  Service: Cardiovascular;  Laterality: N/A;  . Colonoscopy N/A 05/28/2014    Dr. Gala Romney: Redundant colon. single colonic polyp removed as described above. Tubular adenoma  . Esophagogastroduodenoscopy N/A 05/28/2014    Dr. Gala Romney: MIld erosive reflux esophagitis. Grade 1 esophageal varices. Patent esophagus. No dilation performed. Hiatal hernia.   . Esophageal dilation N/A  05/28/2014    Procedure: ESOPHAGEAL DILATION;  Surgeon: Daneil Dolin, MD;  [facility];  Service: Endoscopy;  Laterality: N/A;  . Spinal cord stimulator removal N/A 01/27/2015    Procedure: LUMBAR SPINAL CORD STIMULATOR REMOVAL;  Surgeon: Kristeen Miss, MD;  Location: [place] [facility] NEURO ORS;  Service: Neurosurgery;  Laterality: N/A;  LUMBAR SPINAL CORD STIMULATOR REMOVAL    Prior to Admission medications   Medication Sig Start Date End Date Taking? Authorizing Provider  ANORO ELLIPTA 62.5-25 MCG/INH AEPB INHALE 1 PUFF ONCE DAILY 01/19/15  Yes Collene Gobble, MD  buPROPion [facility] SR) 150 MG 12 hr tablet Take 150 mg by mouth 2 (two) times daily.   Yes Historical Provider, MD  Cholecalciferol (VITAMIN D PO) Take 1 tablet by mouth daily.   Yes Historical Provider, MD  Cyanocobalamin (VITAMIN B 12 PO) Take 1 tablet by mouth daily.   Yes Historical Provider, MD  diazepam (VALIUM) 10 MG tablet Take 10 mg by mouth every 6 (six) hours as needed for anxiety.   Yes Historical Provider, MD  diltiazem (CARDIZEM CD) 180 MG 24 hr capsule Take 1 capsule (180 mg total) by mouth daily. 04/23/15  Yes Arnoldo Lenis, MD  escitalopram (LEXAPRO) 10 MG tablet Take 10 mg by mouth daily.   Yes Historical Provider, MD  flecainide (TAMBOCOR) 100 MG tablet Take 1 tablet (100 mg total) by mouth 2 (two) times daily. 04/07/15  Yes Evans Lance, MD  furosemide (LASIX) 20 MG tablet Take 2 tablets (40 mg total) by mouth daily as needed (swelling). 01/21/15  Yes Arnoldo Lenis, MD  INVOKANA 300 MG TABS tablet TAKE 1 TABLET EVERY DAY 06/04/15  Yes Cassandria Anger, MD  LEVEMIR 100 UNIT/ML injection INJECT 70 UNITS SUBCUTANEOUS AT BEDTIME 01/19/15  Yes Cassandria Anger, MD  levothyroxine (SYNTHROID, LEVOTHROID) 112 MCG tablet TAKE 1 TABLET (112 MCG TOTAL) BY MOUTH DAILY BEFORE BREAKFAST. 06/12/15  Yes Cassandria Anger, MD  metFORMIN (GLUCOPHAGE) 1000 MG tablet TAKE 1 TABLET BY MOUTH TWICE A DAY 06/04/15  Yes  Cassandria Anger, MD  Multiple Vitamin (MULTIVITAMIN) tablet Take 1 tablet by mouth daily.   Yes Historical Provider, MD  nadolol (CORGARD) 40 MG tablet Take 1 tablet (40 mg) in the morning and 1/2 (20 mg) tablet by mouth every evening 06/04/15  Yes Carlis Stable, NP  NOVOLOG FLEXPEN 100 UNIT/ML FlexPen INJECT 10 - 16 UNITS SUBCUTANEOUSLY 3 TIMES A DAY BEFORE MEALS Patient taking differently: INJECT 5-10  UNITS SUBCUTANEOUSLY 3 TIMES A DAY BEFORE MEALS 01/13/15  Yes Cassandria Anger, MD  ondansetron (ZOFRAN) 4 MG tablet Take 1 tablet (4 mg total) by mouth every 8 (eight) hours as needed for nausea or vomiting. 07/21/14  Yes Mahala Menghini, PA-C  OXYGEN Inhale 2 L into the lungs at bedtime.   Yes Historical Provider, MD  pantoprazole (PROTONIX) 40  MG tablet Take 1 tablet (40 mg total) by mouth daily. 10/20/14  Yes Orvil Feil, NP  simvastatin (ZOCOR) 20 MG tablet TAKE 1 TABLET (20 MG TOTAL) BY MOUTH EVERY EVENING. 04/30/15  Yes Arnoldo Lenis, MD  traMADol (ULTRAM) 50 MG tablet Take 50 mg by mouth every 6 (six) hours as needed.   Yes Historical Provider, MD  XARELTO 20 MG TABS tablet TAKE 1 TABLET BY MOUTH EVERY DAY WITH DINNER 06/01/15  Yes Arnoldo Lenis, MD    Allergies as of 06/16/2015 - Review Complete 06/16/2015  Allergen Reaction Noted  . Nitroglycerin Hives, Swelling, and Rash     Family History  Problem Relation Age of Onset  . Stroke Brother   . Cancer Mother   . Arrhythmia Father     Social History   Social History  . Marital Status: Married    Spouse Name: N/A  . Number of Children: N/A  . Years of Education: N/A   Occupational History  . Not on file.   Social History Main Topics  . Smoking status: Former Smoker -- 0.50 packs/day for 45 years    Types: Cigarettes    Start date: 04/11/1968    Quit date: 04/12/2015  . Smokeless tobacco: Former Systems developer    Quit date: 11/13/2012     Comment: Quit x 8 months this time  . Alcohol Use: No     Comment: "quit alcohol  2011"  . Drug Use: No  . Sexual Activity: Not Currently   Other Topics Concern  . Not on file   Social History Narrative    Review of Systems: See HPI, otherwise negative ROS  Physical Exam: BP 133/70 mmHg  Pulse 89  Temp(Src) 97.7 F (36.5 C) (Oral)  Ht 5\' 10"  (1.778 m)  Wt 243 lb 12.8 oz (110.587 kg)  BMI 34.98 kg/m2 General:   Alert,  pleasant and cooperative in NAD Lungs:  Clear throughout to auscultation.   No wheezes, crackles, or rhonchi. No acute distress. Heart:  Regular rate and rhythm; no murmurs, clicks, rubs,  or gallops. Abdomen: obese.Non-distended, normal bowel sounds.  Soft and nontender without appreciable mass or hepatosplenomegaly.  Pulses:  Normal pulses noted. Extremities:  1+ LE edema  Impression: Patient with Ash/Nash cirrhosis remains well compensated. Esophageal varices on Nadolol.  GERD symptoms well controlled on Protonix 40 mg daily. History of colonic adenoma;  he is maintained on spironolactone alone.   Lasix has been held. LFTs normal last month.    Recommendations: Continue Protonix and Naldol daily  Se Dr. Harl Bowie if swelling/weight gain begins to be a problem  Liver ultrasound every 6 months  Repeat colonoscopy in 2021  Office visit in 6 months      Notice: This dictation was prepared with Dragon dictation along with smaller phrase technology. Any transcriptional errors that result from this process are unintentional and may not be corrected upon review.

## 2015-06-16 NOTE — Patient Instructions (Signed)
Continue Protonix and Naldol daily  Se Dr. Harl Bowie if swelling/weight gain begins to be a problem  Liver ultrasound every 6 months  Repeat colonoscopy in 2021  Office visit in 6 months

## 2015-06-19 DIAGNOSIS — E039 Hypothyroidism, unspecified: Secondary | ICD-10-CM | POA: Diagnosis not present

## 2015-06-19 DIAGNOSIS — E6609 Other obesity due to excess calories: Secondary | ICD-10-CM | POA: Diagnosis not present

## 2015-06-19 DIAGNOSIS — Z1389 Encounter for screening for other disorder: Secondary | ICD-10-CM | POA: Diagnosis not present

## 2015-06-19 DIAGNOSIS — E1129 Type 2 diabetes mellitus with other diabetic kidney complication: Secondary | ICD-10-CM | POA: Diagnosis not present

## 2015-06-19 DIAGNOSIS — G8310 Monoplegia of lower limb affecting unspecified side: Secondary | ICD-10-CM | POA: Diagnosis not present

## 2015-06-19 DIAGNOSIS — Z6836 Body mass index (BMI) 36.0-36.9, adult: Secondary | ICD-10-CM | POA: Diagnosis not present

## 2015-06-22 ENCOUNTER — Ambulatory Visit (INDEPENDENT_AMBULATORY_CARE_PROVIDER_SITE_OTHER): Payer: Medicare HMO | Admitting: Emergency Medicine

## 2015-06-22 ENCOUNTER — Encounter: Payer: Self-pay | Admitting: Emergency Medicine

## 2015-06-22 VITALS — BP 130/68 | HR 70 | Ht 70.0 in | Wt 241.4 lb

## 2015-06-22 DIAGNOSIS — J410 Simple chronic bronchitis: Secondary | ICD-10-CM

## 2015-06-22 NOTE — Assessment & Plan Note (Signed)
Please continue your Anoro every day Take albuterol 2 puffs up to every 4 hours if needed for shortness of breath.  Wear your oxygen every night.  Follow with Dr Lamonte Sakai in 6 months or sooner if you have any problems

## 2015-06-22 NOTE — Patient Instructions (Signed)
Please continue your Anoro every day Take albuterol 2 puffs up to every 4 hours if needed for shortness of breath.  Wear your oxygen every night.  Follow with Dr Lamonte Sakai in 6 months or sooner if you have any problems

## 2015-06-22 NOTE — Progress Notes (Signed)
Subjective:    Patient ID: Ralph [name], male    DOB: [birth year], 65 y.o.   MRN: [id number]  HPI 65 yo man, former smoker (130pk-yrs), HTN, cirrhosis w varices, OSA not on CPAP, DM, A flutter s/p ablation and pacer, hiatal hernia. He has dysphagia. Has been dx with COPD in 2014 by Dr Luan Pulling. Wears O2 at 2L/min since Summer 2014. He is on Dulera + ProAir prn. He isn't sure they help him. He was once on Spiriva, didn't feels it helped. He has dyspnea that can happen at rest or with exertion. He has nasal congestion and allergies.    PFT 04/2011 > suggests mixed disease, moderate AFL, no BD response, normal volumes, decreased DLCO.   ROV 10/15/13 -- hx COPD, OSA, restrictive dz. Last time we tried changing dulera to anoro, there was not a big difference with the new medication. He uses albuterol 2-3 times a day. He is still having apneic episodes. Waking up in resp distress.  He uses O2 with exertion, wants a portable concentrator. He has a new psoriatic lesion on L knee.   ROV 12/19/13 -- follow up visit for COPD, OSA, restrictive dz.  He underwent PSG, final read not yet available but suspect negative for OSA.  Has daily cough, green mucous. Has good days and bad days. No real wheeze. He uses albuterol about 2 x a day. Dermatology evaluated his psoriasis > steroid cream.   ROV 12/23/14 -- follow-up visit for COPD and restrictive lung disease. He underwent a polysomnogram 12/21/13 that did not show any evidence for obstructive or central sleep apnea. He was maintained on 2 L/m of oxygen without desaturation. He has undergone ablation since our last visit, has made him feel much better. He is on Anoro daily, tolerates it well. Uses albuterol very rarely. He was given abx and pred this summer by Dr Hilma Favors.   ROV 06/22/15 -- patient follows up for history of mixed obstruction and restriction, COPD. He has nocturnal hypoxemia but no evidence of sleep apnea on polysomnogram. He has been managed on Anoro >    Tolerates well. His breathing has been fairly good, he occasionally feels that his chest is heavy, no overt CP, that does make breathing difficult. May be helped some by SABA - difficult to tell. Not always associated w exertion. He is a bit unsteady. No wheezing, no cough.    Review of Systems  Constitutional: Negative for fever and unexpected weight change.  HENT: Positive for congestion, postnasal drip and sinus pressure. Negative for dental problem, ear pain, nosebleeds, rhinorrhea, sneezing, sore throat and trouble swallowing.   Eyes: Negative for redness and itching.  Respiratory: Negative for cough, chest tightness, shortness of breath and wheezing.   Cardiovascular: Positive for palpitations and leg swelling. Negative for chest pain.  Gastrointestinal: Negative for nausea and vomiting.  Genitourinary: Negative for dysuria.  Musculoskeletal: Negative for joint swelling.  Skin: Negative for rash.  Neurological: Negative for headaches.       Some unsteadiness, imbalance.   Hematological: Does not bruise/bleed easily.  Psychiatric/Behavioral: Positive for dysphoric mood. The patient is not nervous/anxious.        Objective:   Physical Exam Filed Vitals:   06/22/15 1357  BP: 130/68  Pulse: 70  Height: 5\' 10"  (1.778 m)  Weight: 241 lb 6.4 oz (109.498 kg)  SpO2: 93%   Gen: Pleasant, obese, in no distress,  normal affect on O2  ENT: No lesions,  mouth clear,  oropharynx clear,  no postnasal drip  Neck: No JVD, no TMG, no carotid bruits  Lungs: No use of accessory muscles, clear without rales or rhonchi  Cardiovascular: RRR, heart sounds normal, no murmur or gallops, trace peripheral edema  Musculoskeletal: No deformities, no cyanosis or clubbing  Neuro: alert, non focal  Skin: Warm, no lesions or rashes    11/21/12 --  Study Conclusions - Left ventricle: The cavity size was normal. Wall thickness was increased in a pattern of mild LVH. There was moderate asymmetric  hypertrophy of the septum. Systolic function was normal. The estimated ejection fraction was in the range of 60% to 65%. Wall motion was normal; there were no regional wall motion abnormalities. The study is not technically sufficient to allow evaluation of LV diastolic function. - Mitral valve: Trivial regurgitation. - Left atrium: The atrium was mildly dilated. - Right ventricle: The cavity size was mildly to moderately dilated. Pacer wire or catheter noted in right ventricle. Systolic function was normal. - Right atrium: Central venous pressure: 11mm Hg (est). - Tricuspid valve: Trivial regurgitation. - Pulmonary arteries: PA peak pressure: 55mm Hg (S). - Pericardium, extracardiac: There was no pericardial effusion. Impressions:  - No prior study available for comparison. Mild LVH with moderate septal hypertrophy, LVEF 60-65%. indeterminate diastolic function. Mild left atrial enlargement. Mild to moderate RV enlargement, device wire noted. Normal PASP and CVP. No pericardial effusion.     Assessment & Plan:  COPD (chronic obstructive pulmonary disease) Please continue your Anoro every day Take albuterol 2 puffs up to every 4 hours if needed for shortness of breath.  Wear your oxygen every night.  Follow with Dr Lamonte Sakai in 6 months or sooner if you have any problems

## 2015-06-23 DIAGNOSIS — J9610 Chronic respiratory failure, unspecified whether with hypoxia or hypercapnia: Secondary | ICD-10-CM | POA: Diagnosis not present

## 2015-06-23 DIAGNOSIS — I5030 Unspecified diastolic (congestive) heart failure: Secondary | ICD-10-CM | POA: Diagnosis not present

## 2015-06-23 DIAGNOSIS — J449 Chronic obstructive pulmonary disease, unspecified: Secondary | ICD-10-CM | POA: Diagnosis not present

## 2015-06-23 DIAGNOSIS — I4891 Unspecified atrial fibrillation: Secondary | ICD-10-CM | POA: Diagnosis not present

## 2015-06-24 ENCOUNTER — Other Ambulatory Visit: Payer: Self-pay | Admitting: "Endocrinology

## 2015-06-24 DIAGNOSIS — E118 Type 2 diabetes mellitus with unspecified complications: Secondary | ICD-10-CM | POA: Diagnosis not present

## 2015-06-24 DIAGNOSIS — E038 Other specified hypothyroidism: Secondary | ICD-10-CM | POA: Diagnosis not present

## 2015-06-24 DIAGNOSIS — Z794 Long term (current) use of insulin: Secondary | ICD-10-CM | POA: Diagnosis not present

## 2015-06-24 DIAGNOSIS — E1165 Type 2 diabetes mellitus with hyperglycemia: Secondary | ICD-10-CM | POA: Diagnosis not present

## 2015-06-24 LAB — BASIC METABOLIC PANEL
BUN: 31 mg/dL — AB (ref 7–25)
CHLORIDE: 98 mmol/L (ref 98–110)
CO2: 27 mmol/L (ref 20–31)
CREATININE: 0.95 mg/dL (ref 0.70–1.25)
Calcium: 9.7 mg/dL (ref 8.6–10.3)
GLUCOSE: 112 mg/dL — AB (ref 65–99)
Potassium: 4.3 mmol/L (ref 3.5–5.3)
Sodium: 138 mmol/L (ref 135–146)

## 2015-06-24 LAB — TSH: TSH: 1.82 m[IU]/L (ref 0.40–4.50)

## 2015-06-24 LAB — T4, FREE: FREE T4: 1.5 ng/dL (ref 0.8–1.8)

## 2015-06-25 LAB — HEMOGLOBIN A1C
Hgb A1c MFr Bld: 7.5 % — ABNORMAL HIGH (ref <5.7)
Mean Plasma Glucose: 169 mg/dL — ABNORMAL HIGH (ref <117)

## 2015-07-05 DIAGNOSIS — R69 Illness, unspecified: Secondary | ICD-10-CM | POA: Diagnosis not present

## 2015-07-06 ENCOUNTER — Ambulatory Visit (INDEPENDENT_AMBULATORY_CARE_PROVIDER_SITE_OTHER): Payer: Medicare HMO | Admitting: "Endocrinology

## 2015-07-06 ENCOUNTER — Encounter: Payer: Self-pay | Admitting: "Endocrinology

## 2015-07-06 VITALS — BP 100/68 | HR 86 | Resp 18 | Ht 70.0 in | Wt 245.0 lb

## 2015-07-06 DIAGNOSIS — E118 Type 2 diabetes mellitus with unspecified complications: Secondary | ICD-10-CM

## 2015-07-06 DIAGNOSIS — E785 Hyperlipidemia, unspecified: Secondary | ICD-10-CM

## 2015-07-06 DIAGNOSIS — E038 Other specified hypothyroidism: Secondary | ICD-10-CM | POA: Diagnosis not present

## 2015-07-06 DIAGNOSIS — Z794 Long term (current) use of insulin: Secondary | ICD-10-CM | POA: Diagnosis not present

## 2015-07-06 DIAGNOSIS — I10 Essential (primary) hypertension: Secondary | ICD-10-CM | POA: Diagnosis not present

## 2015-07-06 DIAGNOSIS — IMO0002 Reserved for concepts with insufficient information to code with codable children: Secondary | ICD-10-CM

## 2015-07-06 DIAGNOSIS — E1165 Type 2 diabetes mellitus with hyperglycemia: Secondary | ICD-10-CM

## 2015-07-06 NOTE — Progress Notes (Signed)
Subjective:    Patient ID: Ralph [name], male    DOB: [birth year], PCP Purvis Kilts, MD   Past Medical History  Diagnosis Date  . Hypertension   . 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 Depressed   . Varicose vein     of esophagus  . CHF (congestive heart failure) ([place]) 01/06/2012  . Sinus pause 01/06/2012    5.2 seconds  . Anginal pain ([place])   . Sleep apnea     "don't wear mask" (01/06/2012)  . Emphysema   . Type II diabetes mellitus ([place])   . GERD (gastroesophageal reflux disease)   . H/O hiatal hernia   . Coughing up blood     "comes from my throat" (01/06/2012)  . Arthritis     "back; fingers" (01/06/2012)  . Chronic lower back pain   . Fatty liver disease, nonalcoholic   . CHB (complete heart block) ([place])   . Presence of permanent cardiac pacemaker 9/292013    [facility]  . Atrial flutter [facility])     s/p EPS +RF ablation of typical atrial flutter April 2015  . Pacemaker   . Stroke [facility])     pt states that he might have had a stroke not sure  . Pneumonia Aug 2016  . Hypothyroidism   . Headache   . DDD (degenerative disc disease), lumbar   . Cancer ([place])     skin cancer  . Difficult intubation     Eschmann stylet used in 2002 and 2007; "trouble waking up afterwards" (01/06/2012)   Past Surgical History  Procedure Laterality Date  . Back surgery    . Spinal cord stimulator implant  2006  . Cholecystectomy  1993  . Nasal septum surgery  1992  . Tonsillectomy and adenoidectomy  1992  . Posterior fusion lumbar spine  1999    L4-5  . Lumbar disc surgery  1994; ~ 1995; ~ 1996  . Warthin's tumor excision  1990's    right  . Permanent pacemaker insertion  01/08/2012    CHB  . US echocardiography  12/28/2011    mild LVH,mild mitral annulara ca+,mild MR  . Nuclear stress test  10/19/2004    No ischemia  . Esophagogastroduodenoscopy  11/08/2004    [id number] esophageal erosions consistent with erosive reflux esophagitis/Areas of hemorrhage and  nodularity of the fundal mucosa of uncertain significance, biopsied.  Small hiatal hernia, otherwise normal stomach  . Colonoscopy  11/08/2004    [id number] rectum, colon, TI.  Marland Kitchen Esophagogastroduodenoscopy  2010    Dr. Gala Romney: 3 columns Grade 1 varices, erosive esophagitis, HH, portal gastropathy, normal D1, D2  . Esophagogastroduodenoscopy (egd) with esophageal dilation N/A 02/14/2013    [id number] esophageal varices. Abnormal distal esophagus/status post biopsy after Maloney dilation. Portal gastropathy. Antral erosions-status post biopsy. path negative for H.pylori, benign path.  . Permanent pacemaker insertion N/A 01/09/2012    Procedure: PERMANENT PACEMAKER INSERTION;  Surgeon: Sanda Klein, MD;  Location: [place] CATH LAB;  Service: Cardiovascular;  Laterality: N/A;  . Atrial flutter ablation N/A 07/10/2013    Procedure: ATRIAL FLUTTER ABLATION;  Surgeon: Evans Lance, MD;  Location[facility] CATH LAB;  Service: Cardiovascular;  Laterality: N/A;  . Colonoscopy N/A 05/28/2014    Dr. Gala Romney: Redundant colon. single colonic polyp removed as described above. Tubular adenoma  . Esophagogastroduodenoscopy N/A 05/28/2014    Dr. Gala Romney: MIld erosive reflux esophagitis. Grade 1 esophageal varices. Patent esophagus. No dilation performed. Hiatal  hernia.   . Esophageal dilation N/A 05/28/2014    Procedure: ESOPHAGEAL DILATION;  Surgeon: Daneil Dolin, MD;  Location: [facility];  Service: Endoscopy;  Laterality: N/A;  . Spinal cord stimulator removal N/A 01/27/2015    Procedure: LUMBAR SPINAL CORD STIMULATOR REMOVAL;  Surgeon: Kristeen Miss, MD;  Location: [place] NEURO ORS;  Service: Neurosurgery;  Laterality: N/A;  LUMBAR SPINAL CORD STIMULATOR REMOVAL   Social History   Social History  . Marital Status: Married    Spouse Name: N/A  . Number of Children: N/A  . Years of Education: N/A   Social History Main Topics  . Smoking status: Former Smoker -- 0.50 packs/day for 45 years    Types: Cigarettes    Start  date: 04/11/1968    Quit date: 04/12/2015  . Smokeless tobacco: Former Systems developer    Quit date: 11/13/2012     Comment: Quit x 8 months this time  . Alcohol Use: No     Comment: "quit alcohol 2011"  . Drug Use: No  . Sexual Activity: Not Currently   Other Topics Concern  . None   Social History Narrative   Outpatient Encounter Prescriptions as of 07/06/2015  Medication Sig  . ANORO ELLIPTA 62.5-25 MCG/INH AEPB INHALE 1 PUFF ONCE DAILY  . buPROPion (WELLBUTRIN SR) 150 MG 12 hr tablet Take 150 mg by mouth 2 (two) times daily.  . Cholecalciferol (VITAMIN D PO) Take 1 tablet by mouth daily.  . Cyanocobalamin (VITAMIN B 12 PO) Take 1 tablet by mouth daily.  . diazepam (VALIUM) 10 MG tablet Take 10 mg by mouth every 6 (six) hours as needed for anxiety.  Marland Kitchen diltiazem (CARDIZEM CD) 180 MG 24 hr capsule Take 1 capsule (180 mg total) by mouth daily.  Marland Kitchen escitalopram (LEXAPRO) 10 MG tablet Take 10 mg by mouth daily.  . flecainide (TAMBOCOR) 100 MG tablet Take 1 tablet (100 mg total) by mouth 2 (two) times daily.  . furosemide (LASIX) 20 MG tablet Take 2 tablets (40 mg total) by mouth daily as needed (swelling).  . INVOKANA 300 MG TABS tablet TAKE 1 TABLET EVERY DAY  . LEVEMIR 100 UNIT/ML injection INJECT 70 UNITS SUBCUTANEOUS AT BEDTIME  . levothyroxine (SYNTHROID, LEVOTHROID) 112 MCG tablet TAKE 1 TABLET (112 MCG TOTAL) BY MOUTH DAILY BEFORE BREAKFAST.  . metFORMIN (GLUCOPHAGE) 1000 MG tablet TAKE 1 TABLET BY MOUTH TWICE A DAY  . Multiple Vitamin (MULTIVITAMIN) tablet Take 1 tablet by mouth daily.  . nadolol (CORGARD) 40 MG tablet Take 1 tablet (40 mg) in the morning and 1/2 (20 mg) tablet by mouth every evening  . NOVOLOG FLEXPEN 100 UNIT/ML FlexPen INJECT 10 - 16 UNITS SUBCUTANEOUSLY 3 TIMES A DAY BEFORE MEALS (Patient taking differently: INJECT 5-10  UNITS SUBCUTANEOUSLY 3 TIMES A DAY BEFORE MEALS)  . ondansetron (ZOFRAN) 4 MG tablet Take 1 tablet (4 mg total) by mouth every 8 (eight) hours as  needed for nausea or vomiting.  . OXYGEN Inhale 2 L into the lungs at bedtime.  . pantoprazole (PROTONIX) 40 MG tablet Take 1 tablet (40 mg total) by mouth daily.  . simvastatin (ZOCOR) 20 MG tablet TAKE 1 TABLET (20 MG TOTAL) BY MOUTH EVERY EVENING.  . traMADol (ULTRAM) 50 MG tablet Take 50 mg by mouth every 6 (six) hours as needed.  Alveda Reasons 20 MG TABS tablet TAKE 1 TABLET BY MOUTH EVERY DAY WITH DINNER   No facility-administered encounter medications on file as of 07/06/2015.   ALLERGIES: Allergies  Allergen Reactions  . Nitroglycerin Hives, Swelling and Rash   VACCINATION STATUS: Immunization History  Administered Date(s) Administered  . Influenza Split 01/09/2013  . Influenza,inj,Quad PF,36+ Mos 01/09/2014, 12/23/2014  . Pneumococcal Polysaccharide-23 01/10/2012    Diabetes He presents for his follow-up diabetic visit. He has type 2 diabetes mellitus. Onset time: He was diagnosed at approximate age of 45 years. His disease course has been improving. There are no hypoglycemic associated symptoms. Pertinent negatives for hypoglycemia include no confusion, headaches, pallor or seizures. There are no diabetic associated symptoms. Pertinent negatives for diabetes include no chest pain, no fatigue, no polydipsia, no polyphagia, no polyuria and no weakness. There are no hypoglycemic complications. Symptoms are improving. Diabetic complications include a CVA, heart disease and nephropathy. Risk factors for coronary artery disease include diabetes mellitus, dyslipidemia, hypertension, male sex, obesity, sedentary lifestyle and tobacco exposure. Current diabetic treatment includes intensive insulin program. He is compliant with treatment most of the time. His weight is stable. He is following a diabetic diet. When asked about meal planning, he reported none. He participates in exercise intermittently. There is no change in his home blood glucose trend. His breakfast blood glucose range is generally  140-180 mg/dl. His lunch blood glucose range is generally 140-180 mg/dl. His dinner blood glucose range is generally 140-180 mg/dl. His overall blood glucose range is 140-180 mg/dl.  Hyperlipidemia This is a chronic problem. The current episode started more than 1 year ago. Pertinent negatives include no chest pain, myalgias or shortness of breath. Current antihyperlipidemic treatment includes statins. Risk factors for coronary artery disease include dyslipidemia, diabetes mellitus, hypertension, male sex and a sedentary lifestyle.  Hypertension This is a chronic problem. The current episode started more than 1 year ago. Pertinent negatives include no chest pain, headaches, neck pain, palpitations or shortness of breath. Risk factors for coronary artery disease include diabetes mellitus, dyslipidemia, obesity, sedentary lifestyle and smoking/tobacco exposure. Compliance problems include diet.  Hypertensive end-organ damage includes CVA and a thyroid problem.  Thyroid Problem Presents for follow-up visit. Patient reports no cold intolerance, constipation, diarrhea, fatigue, heat intolerance or palpitations. The symptoms have been improving. Past treatments include levothyroxine. His past medical history is significant for hyperlipidemia.     Review of Systems  Constitutional: Negative for fatigue and unexpected weight change.  HENT: Negative for dental problem, mouth sores and trouble swallowing.   Eyes: Negative for visual disturbance.  Respiratory: Negative for cough, choking, chest tightness, shortness of breath and wheezing.   Cardiovascular: Negative for chest pain, palpitations and leg swelling.  Gastrointestinal: Negative for nausea, vomiting, abdominal pain, diarrhea, constipation and abdominal distention.  Endocrine: Negative for cold intolerance, heat intolerance, polydipsia, polyphagia and polyuria.  Genitourinary: Negative for dysuria, urgency, hematuria and flank pain.   Musculoskeletal: Negative for myalgias, back pain, gait problem and neck pain.  Skin: Negative for pallor, rash and wound.  Neurological: Negative for seizures, syncope, weakness, numbness and headaches.  Psychiatric/Behavioral: Negative.  Negative for confusion and dysphoric mood.    Objective:    BP 100/68 mmHg  Pulse 86  Resp 18  Ht 5\' 10"  (1.778 m)  Wt 245 lb (111.131 kg)  BMI 35.15 kg/m2  SpO2 93%  Wt Readings from Last 3 Encounters:  07/06/15 245 lb (111.131 kg)  06/22/15 241 lb 6.4 oz (109.498 kg)  06/16/15 243 lb 12.8 oz (110.587 kg)    Physical Exam  Constitutional: He is oriented to person, place, and time. He appears well-developed and well-nourished. He is cooperative. No  distress.  HENT:  Head: Normocephalic and atraumatic.  Eyes: EOM are normal.  Neck: Normal range of motion. Neck supple. No tracheal deviation present. No thyromegaly present.  Cardiovascular: Normal rate, S1 normal, S2 normal and normal heart sounds.  Exam reveals no gallop.   No murmur heard. Pulses:      Dorsalis pedis pulses are 1+ on the right side, and 1+ on the left side.       Posterior tibial pulses are 1+ on the right side, and 1+ on the left side.  Pulmonary/Chest: Breath sounds normal. No respiratory distress. He has no wheezes.  Abdominal: Soft. Bowel sounds are normal. He exhibits no distension. There is no tenderness. There is no guarding and no CVA tenderness.  Musculoskeletal: He exhibits no edema.       Right shoulder: He exhibits no swelling and no deformity.  Neurological: He is alert and oriented to person, place, and time. He has normal strength and normal reflexes. No cranial nerve deficit or sensory deficit. Gait normal.  Skin: Skin is warm and dry. No rash noted. No cyanosis. Nails show no clubbing.  Psychiatric: He has a normal mood and affect. His speech is normal and behavior is normal. Judgment and thought content normal. Cognition and memory are normal.    CMP      Component Value Date/Time   NA 138 06/24/2015 1055   K 4.3 06/24/2015 1055   CL 98 06/24/2015 1055   CO2 27 06/24/2015 1055   GLUCOSE 112* 06/24/2015 1055   BUN 31* 06/24/2015 1055   CREATININE 0.95 06/24/2015 1055   CREATININE 1.00 01/22/2015 1055   CALCIUM 9.7 06/24/2015 1055   PROT 6.8 06/05/2015 1130   ALBUMIN 4.1 06/05/2015 1130   ALBUMIN 3.3 12/17/2012   AST 19 06/05/2015 1130   AST 23 12/17/2012   ALT 30 06/05/2015 1130   ALKPHOS 114 06/05/2015 1130   ALKPHOS 423 12/17/2012   BILITOT 0.4 06/05/2015 1130   BILITOT 0.7 12/17/2012   GFRNONAA >60 01/22/2015 1055   GFRAA >60 01/22/2015 1055   Diabetic Labs (most recent): Lab Results  Component Value Date   HGBA1C 7.5* 06/24/2015   HGBA1C 6.8* 01/22/2015   HGBA1C 7.9* 06/07/2013   Most Recent Lipid Panel Lipid Panel     Component Value Date/Time   CHOL 118 05/30/2014 0709   TRIG 166* 05/30/2014 0709   HDL 40 05/30/2014 0709   CHOLHDL 3.0 05/30/2014 0709   VLDL 33 05/30/2014 0709   LDLCALC 45 05/30/2014 0709     Assessment & Plan:   1. Uncontrolled type 2 diabetes mellitus with complication, with long-term current use of insulin (HCC)  - His diabetes is  complicated by coronary artery disease/cardiomyopathy, cerebrovascular accident, and patient remains at a high risk for more acute and chronic complications of diabetes which include CAD, CVA, CKD, retinopathy, and neuropathy. These are all discussed in detail with the patient.  Patient came with controlled glucose profile, and  recent A1c of 7.5% increasing from  6.8 %.  Glucose logs and insulin administration records pertaining to this visit,  to be scanned into patient's records.  Recent labs reviewed.   - I have re-counseled the patient on diet management and weight loss  by adopting a carbohydrate restricted / protein rich  Diet.  - Suggestion is made for patient to avoid simple carbohydrates   from their diet including Cakes , Desserts, Ice Cream,  Soda (   diet and regular) , Sweet Tea , Candies,  Chips, Cookies, Artificial Sweeteners,   and "Sugar-free" Products .  This will help patient to have stable blood glucose profile and potentially avoid unintended  Weight gain.  - Patient is advised to stick to a routine mealtimes to eat 3 meals  a day and avoid unnecessary snacks ( to snack only to correct hypoglycemia).  - The patient  has been  scheduled with Jearld Fenton, RDN, CDE for individualized DM education.  - I have approached patient with the following individualized plan to manage diabetes and patient agrees.  -Continue Levemir 70 units qhs, Nvolog 5 units TIDAC plus correction dose associated with monitoring of BG ac and hs. he has a chance to come off of Novolog if he  improves his a1c.  -Adjustment parameters for hypo and hyperglycemia were given in a written document to patient.  -Patient is encouraged to call clinic for blood glucose levels less than 70 or above 300 mg /dl.  -I will continue Metformin 1000mg  po BID, add Invokana 300mg  po qam, SE discussed with patient.   - Patient specific target  for A1c; LDL, HDL, Triglycerides, and  Waist Circumference were discussed in detail.  2) BP/HTN: Controlled. Continue current medications . 3) Lipids/HPL:  continue statins. 4)  Weight/Diet: CDE consult in progress, exercise, and carbohydrates information provided. 5)  hypothyroidism -I will increase his levothyroxine to 112 g by mouth every morning.  - We discussed about correct intake of levothyroxine, at fasting, with water, separated by at least 30 minutes from breakfast, and separated by more than 4 hours from calcium, iron, multivitamins, acid reflux medications (PPIs). -Patient is made aware of the fact that thyroid hormone replacement is needed for life, dose to be adjusted by periodic monitoring of thyroid function tests.   6) Chronic Care/Health Maintenance:  -Patient  is Statin medications and encouraged to continue to  follow up with Ophthalmology, Podiatrist at least yearly or according to recommendations, and advised to quit smoking. I have recommended yearly flu vaccine and pneumonia vaccination at least every 5 years; moderate intensity exercise for up to 150 minutes weekly; and  sleep for at least 7 hours a day.  - 25 minutes of time was spent on the care of this patient , 50% of which was applied for counseling on diabetes complications and their preventions.  - I advised patient to maintain close follow up with Purvis Kilts, MD for primary care needs.  Patient is asked to bring meter and  blood glucose logs during their next visit.   Follow up plan: -Return in about 3 months (around 10/06/2015) for diabetes, high blood pressure, high cholesterol, underactive thyroid, follow up with pre-visit labs, meter, and logs.  Glade Lloyd, MD Phone: [phone]  Fax: [phone]   07/06/2015, 2:25 PM

## 2015-07-06 NOTE — Patient Instructions (Signed)

## 2015-07-08 DIAGNOSIS — L4050 Arthropathic psoriasis, unspecified: Secondary | ICD-10-CM | POA: Diagnosis not present

## 2015-07-23 ENCOUNTER — Encounter: Payer: Self-pay | Admitting: Neurology

## 2015-07-23 ENCOUNTER — Other Ambulatory Visit (INDEPENDENT_AMBULATORY_CARE_PROVIDER_SITE_OTHER): Payer: Medicare HMO

## 2015-07-23 ENCOUNTER — Ambulatory Visit (INDEPENDENT_AMBULATORY_CARE_PROVIDER_SITE_OTHER): Payer: Medicare HMO | Admitting: Neurology

## 2015-07-23 VITALS — BP 114/64 | HR 88 | Ht 70.0 in | Wt 238.2 lb

## 2015-07-23 DIAGNOSIS — R29898 Other symptoms and signs involving the musculoskeletal system: Secondary | ICD-10-CM

## 2015-07-23 DIAGNOSIS — E0842 Diabetes mellitus due to underlying condition with diabetic polyneuropathy: Secondary | ICD-10-CM | POA: Diagnosis not present

## 2015-07-23 DIAGNOSIS — R278 Other lack of coordination: Secondary | ICD-10-CM

## 2015-07-23 DIAGNOSIS — M5417 Radiculopathy, lumbosacral region: Secondary | ICD-10-CM | POA: Diagnosis not present

## 2015-07-23 LAB — VITAMIN B12: Vitamin B-12: 711 pg/mL (ref 211–911)

## 2015-07-23 NOTE — Progress Notes (Signed)
Note routed

## 2015-07-23 NOTE — Patient Instructions (Addendum)
1.  Physical therapy for leg strengthening and balance therapy 2.  Start using a cane  Return to clinic in 3-4 months  [facility]  Preventing Falls in the [place] are common, often dreaded events in the lives of older people. Aside from the obvious injuries and even death that may result, falls can cause wide-ranging consequences including loss of independence, mental decline, decreased activity, and mobility. Younger people are also at risk of falling, especially those with chronic illnesses and fatigue.  Ways to reduce the risk for falling:  * Examine diet and medications. Warm foods and alcohol dilate blood vessels, which can lead to dizziness when standing. Sleep aids, antidepressants, and pain medications can also increase the likelihood of a fall.  * Get a vison exam. Poor vision, cataracts, and glaucoma increase the chances of falling.  * Check foot gear. Shoes should fit snugly and have a sturdy, nonskid sole and broad, low heel.  * Participate in a physician-approved exercise program to build and maintain muscle strength and improve balance and coordination.  * Increase vitamin D intake. Vitamin D improves muscle strength and increases the amount of calcium the body is able to absorb and deposit in bones.  How to prevent falls from common hazards:  * Floors - Remove all loose wires, cords, and throw rugs. Minimize clutter. Make sure rugs are anchored and smooth. Keep furniture in its usual place.  * Chairs - Use chairs with straight backs, armrests, and firm seats. Add firm cushions to existing pieces to add height.  * Bathroom - Install grab bars and non-skid tape in the tub or shower. Use a bathtub transfer bench or a shower chair with a back support. Use an elevated toilet seat and/or safety rails to assist standing from a low surface. Do not use towel racks or bathroom tissue holders to help you stand.  * Lighting - Make sure halls, stairways, and entrances are well-lit.  Install a night light in your bathroom or hallway. Make sure there is a light switch at the top and bottom of the staircase. Turn lights on if you get up in the middle of the night. Make sure lamps or light switches are within reach of the bed if you have to get up during the night.  * Kitchen - Install non-skid rubber mats near the sink and stove. Clean spills immediately. Store frequently used utensils, pots, and pans between waist and eye level. This helps prevent reaching and bending. Sit when getting things out of the lower cupboards.  * Living room / Union furniture with wide spaces in between, giving enough room to move around. Establish a route through the living room that gives you something to hold onto as you walk.  * Stairs - Make sure treads, rails, and rugs are secure. Install a rail on both sides of the stairs. If stairs are a threat, it might be helpful to arrange most of your activities on the lower level to reduce the number of times you must climb the stairs.  * Entrances and doorways - Install metal handles on the walls adjacent to the doorknobs of all doors to make it more secure as you travel through the doorway.  Tips for maintaining balance:  * Keep at least one hand free at all times Try using a backpack or fanny pack to hold things rather than carrying them in your hands. Never carry objects in both hands when walking as this interferes with  keeping your balance.  * Attempt to swing both arms from front to back while walking. This might require a conscious effort if Parkinson's disease has diminished your movement. It will, however, help you to maintain balance and posture, and reduce fatigue.  * Consciously lift your feet off the ground when walking. Shuffling and dragging of the feet is a common culprit in losing your balance.  * When trying to navigate turns, use a "U" technique of facing forward and making a wide turn, rather than pivoting sharply.  * Try to stand  with your feet shoulder-length apart. When your feet are close together for any length of time, you increase your risk of losing your balance and falling.  * Do one thing at a time. Do not try to walk and accomplish another task, such as reading or looking around. The decrease in your automatic reflexes complicates motor function, so the less distraction, the better.  * Do not wear rubber or gripping soled shoes, they might "catch" on the floor and cause tripping.  * Move slowly when changing positions. Use deliberate, concentrated movements and, if needed, use a grab bar or walking aid. Count fifteen (15) seconds after standing to begin walking.  * If balance is a continuous problem, you might want to consider a walking aid such as a cane, walking stick, or walker. Once you have mastered walking with help, you may be ready to try it again on your own.  This information is provided by [facility] and is not intended to replace the medical advice of your physician or other health care providers. Please consult your physician or other health care providers for advice regarding your specific medical condition.

## 2015-07-23 NOTE — Progress Notes (Signed)
[facility] Clinic Note - Initial Visit   Date: 07/23/2015  Ralph [name] MRN: [id number] DOB: 03-20-1951   Dear Dr. Hilma Favors:  Thank you for your kind referral of Ralph [name] for consultation of bilateral leg weakness. Although his history is well known to you, please allow [place] to reiterate it for the purpose of our medical record. The patient was accompanied to the clinic by wife who also provides collateral information.     History of Present Illness: Ralph [name] is a 65 y.o. right-handed Caucasian male with hypertension, NASH cirrhosis, diabetes mellitus, depression, hypothyroidism, and atrial fibrillation on xeralto s/p pacemaker, and multiple prior lumbar surgeries with fusion presenting for evaluation of bilateral leg weakness and gait instability.    Starting around 2012, he began noticing progressively worsen imbalance.  Symptoms have worsened recently as he has fallen twice this year, but stumbles multiple times per day.  He endorses weakness of the legs, especially with getting up from a low chair and climbing stairs.  He also used to get a lot of tingling into his legs.   He also complains of whole-body dull and achy pain, mostly in the joints.  He has chronic pain and had spinal cord stimulator placed in 2007 but in October 2016 due to it being nonfunctional.  Since having the spinal cord stimulator removed, much of his tingling has resolved.  He had tramadol and hydrocodone which he takes for his achy, whole-body pain.  He denies any painful tingling, electrical pain.   He reports having four prior lumbar surgeries including lumbar fusion by Dr. Ellene Route.  There are no plans for future surgeries at this time.   Out-side paper records, electronic medical record, and images have been reviewed where available and summarized as:  CT lumbar myelogram 02/13/2009:  Status post fusion L1-2 level without complication.  Disc protrusions L2-3, L3-4 and L4-5  with findings most notable at the L4-5 level as described above.  Lab Results  Component Value Date   TSH 1.82 06/24/2015   Lab Results  Component Value Date   HGBA1C 7.5* 06/24/2015   Lab Results  Component Value Date   TROPONINI <0.03 08/26/2014    Past Medical History  Diagnosis Date  . Hypertension   . Cirrhosis ([place])     NASH-Hep A and B immune  . Depressed   . Varicose vein     of esophagus  . CHF (congestive heart failure) ([place]) 01/06/2012  . Sinus pause 01/06/2012    5.2 seconds  . Anginal pain ([place])   . Sleep apnea     "don't wear mask" (01/06/2012)  . Emphysema   . Type II diabetes mellitus ([place])   . GERD (gastroesophageal reflux disease)   . H/O hiatal hernia   . Coughing up blood     "comes from my throat" (01/06/2012)  . Arthritis     "back; fingers" (01/06/2012)  . Chronic lower back pain   . Fatty liver disease, nonalcoholic   . CHB (complete heart block) ([place])   . Presence of permanent cardiac pacemaker 9/292013    [employer]  . Atrial flutter [facility])     s/p EPS +RF ablation of typical atrial flutter April 2015  . Pacemaker   . Stroke [facility])     pt states that he might have had a stroke not sure  . Pneumonia Aug 2016  . Hypothyroidism   . Headache   . DDD (degenerative disc disease), lumbar   .  Cancer ([place])     skin cancer  . Difficult intubation     Eschmann stylet used in 2002 and 2007; "trouble waking up afterwards" (01/06/2012)    Past Surgical History  Procedure Laterality Date  . Back surgery    . Spinal cord stimulator implant  2006  . Cholecystectomy  1993  . Nasal septum surgery  1992  . Tonsillectomy and adenoidectomy  1992  . Posterior fusion lumbar spine  1999    L4-5  . Lumbar disc surgery  1994; ~ 1995; ~ 1996  . Warthin's tumor excision  1990's    right  . Permanent pacemaker insertion  01/08/2012    CHB  . US echocardiography  12/28/2011    mild LVH,mild mitral annulara ca+,mild MR  . Nuclear stress test  10/19/2004    No  ischemia  . Esophagogastroduodenoscopy  11/08/2004    [id number] esophageal erosions consistent with erosive reflux esophagitis/Areas of hemorrhage and nodularity of the fundal mucosa of uncertain significance, biopsied.  Small hiatal hernia, otherwise normal stomach  . Colonoscopy  11/08/2004    [id number] rectum, colon, TI.  Marland Kitchen Esophagogastroduodenoscopy  2010    Dr. Gala Romney: 3 columns Grade 1 varices, erosive esophagitis, HH, portal gastropathy, normal D1, D2  . Esophagogastroduodenoscopy (egd) with esophageal dilation N/A 02/14/2013    [id number] esophageal varices. Abnormal distal esophagus/status post biopsy after Maloney dilation. Portal gastropathy. Antral erosions-status post biopsy. path negative for H.pylori, benign path.  . Permanent pacemaker insertion N/A 01/09/2012    Procedure: PERMANENT PACEMAKER INSERTION;  Surgeon: Sanda Klein, MD;  Location: [facility] CATH LAB;  Service: Cardiovascular;  Laterality: N/A;  . Atrial flutter ablation N/A 07/10/2013    Procedure: ATRIAL FLUTTER ABLATION;  Surgeon: Evans Lance, MD;  Location[facility] CATH LAB;  Service: Cardiovascular;  Laterality: N/A;  . Colonoscopy N/A 05/28/2014    Dr. Gala Romney: Redundant colon. single colonic polyp removed as described above. Tubular adenoma  . Esophagogastroduodenoscopy N/A 05/28/2014    Dr. Gala Romney: MIld erosive reflux esophagitis. Grade 1 esophageal varices. Patent esophagus. No dilation performed. Hiatal hernia.   . Esophageal dilation N/A 05/28/2014    Procedure: ESOPHAGEAL DILATION;  Surgeon: Daneil Dolin, MD;  Location[facility];  Service: Endoscopy;  Laterality: N/A;  . Spinal cord stimulator removal N/A 01/27/2015    Procedure: LUMBAR SPINAL CORD STIMULATOR REMOVAL;  Surgeon: Kristeen Miss, MD;  Location: [facility] NEURO ORS;  Service: Neurosurgery;  Laterality: N/A;  LUMBAR SPINAL CORD STIMULATOR REMOVAL     Medications:  Outpatient Encounter Prescriptions as of 07/23/2015  Medication Sig Note  . ANORO ELLIPTA  62.5-25 MCG/INH AEPB INHALE 1 PUFF ONCE DAILY   . buPROPion (WELLBUTRIN SR) 150 MG 12 hr tablet Take 150 mg by mouth 2 (two) times daily.   . Cholecalciferol (VITAMIN D PO) Take 1 tablet by mouth daily.   . Cyanocobalamin (VITAMIN B 12 PO) Take 1 tablet by mouth daily.   . diazepam (VALIUM) 10 MG tablet Take 10 mg by mouth every 6 (six) hours as needed for anxiety. 01/31/2013: Chronic back spasms  . diltiazem (CARDIZEM CD) 180 MG 24 hr capsule Take 1 capsule (180 mg total) by mouth daily.   Marland Kitchen escitalopram (LEXAPRO) 10 MG tablet Take 10 mg by mouth daily.   . flecainide (TAMBOCOR) 100 MG tablet Take 1 tablet (100 mg total) by mouth 2 (two) times daily.   . furosemide (LASIX) 20 MG tablet Take 2 tablets (40 mg total) by mouth daily as needed (swelling).   Marland Kitchen  INVOKANA 300 MG TABS tablet TAKE 1 TABLET EVERY DAY   . LEVEMIR 100 UNIT/ML injection INJECT 70 UNITS SUBCUTANEOUS AT BEDTIME   . levothyroxine (SYNTHROID, LEVOTHROID) 112 MCG tablet TAKE 1 TABLET (112 MCG TOTAL) BY MOUTH DAILY BEFORE BREAKFAST.   . metFORMIN (GLUCOPHAGE) 1000 MG tablet TAKE 1 TABLET BY MOUTH TWICE A DAY   . Multiple Vitamin (MULTIVITAMIN) tablet Take 1 tablet by mouth daily.   . nadolol (CORGARD) 40 MG tablet Take 1 tablet (40 mg) in the morning and 1/2 (20 mg) tablet by mouth every evening   . NOVOLOG FLEXPEN 100 UNIT/ML FlexPen INJECT 10 - 16 UNITS SUBCUTANEOUSLY 3 TIMES A DAY BEFORE MEALS (Patient taking differently: INJECT 5-10  UNITS SUBCUTANEOUSLY 3 TIMES A DAY BEFORE MEALS) 01/21/2015: 5-10 units  . ondansetron (ZOFRAN) 4 MG tablet Take 1 tablet (4 mg total) by mouth every 8 (eight) hours as needed for nausea or vomiting.   . OXYGEN Inhale 2 L into the lungs at bedtime.   . pantoprazole (PROTONIX) 40 MG tablet Take 1 tablet (40 mg total) by mouth daily.   . simvastatin (ZOCOR) 20 MG tablet TAKE 1 TABLET (20 MG TOTAL) BY MOUTH EVERY EVENING.   . traMADol (ULTRAM) 50 MG tablet Take 50 mg by mouth every 6 (six) hours as  needed.   Alveda Reasons 20 MG TABS tablet TAKE 1 TABLET BY MOUTH EVERY DAY WITH DINNER    No facility-administered encounter medications on file as of 07/23/2015.     Allergies:  Allergies  Allergen Reactions  . Nitroglycerin Hives, Swelling and Rash    Family History: Family History  Problem Relation Age of Onset  . Stroke Brother   . Cancer Mother   . Arrhythmia Father     Social History: Social History  Substance Use Topics  . Smoking status: Former Smoker -- 0.50 packs/day for 45 years    Types: Cigarettes    Start date: 04/11/1968    Quit date: 04/12/2015  . Smokeless tobacco: Former Systems developer    Quit date: 11/13/2012     Comment: Quit x 8 months this time  . Alcohol Use: No     Comment: "quit alcohol 2011"   Social History   Social History Narrative    Review of Systems:  CONSTITUTIONAL: No fevers, chills, night sweats, or weight loss.   EYES: No visual changes or eye pain ENT: No hearing changes.  No history of nose bleeds.   RESPIRATORY: No cough, wheezing and shortness of breath.   CARDIOVASCULAR: Negative for chest pain, and palpitations.   GI: Negative for abdominal discomfort, blood in stools or black stools.  No recent change in bowel habits.   GU:  No history of incontinence.   MUSCLOSKELETAL: No history of joint pain or swelling.  No myalgias.   SKIN: Negative for lesions, rash, and itching.   HEMATOLOGY/ONCOLOGY: Negative for prolonged bleeding, bruising easily, and swollen nodes.  No history of cancer.   ENDOCRINE: Negative for cold or heat intolerance, polydipsia or goiter.   PSYCH:  +depression or anxiety symptoms.   NEURO: As Above.   Vital Signs:  BP 114/64 mmHg  Ht 5\' 10"  (1.778 m)  Wt 238 lb 3 oz (108.041 kg)  BMI 34.18 kg/m2 Pain Scale: 10 on a scale of 0-10   General Medical Exam:   General:  Well appearing, comfortable.   Eyes/ENT: see cranial nerve examination.   Neck: No masses appreciated.  Full range of motion without tenderness.   No  carotid bruits. Respiratory:  Clear to auscultation, good air entry bilaterally.   Cardiac:  Regular rate and rhythm, no murmur.   Extremities:  No deformities, edema, or skin discoloration.  Skin:  No rashes or lesions.  Neurological Exam: MENTAL STATUS including orientation to time, place, person, recent and remote memory, attention span and concentration, language, and fund of knowledge is normal.  Speech is not dysarthric.  CRANIAL NERVES: II:  No visual field defects.  Unremarkable fundi.   III-IV-VI: Pupils equal round and reactive to light.  Normal conjugate, extra-ocular eye movements in all directions of gaze.  No nystagmus.  No ptosis.   V:  Normal facial sensation.    VII:  Normal facial symmetry and movements.  No pathologic facial reflexes.  VIII:  Normal hearing and vestibular function.   IX-X:  Normal palatal movement.   XI:  Normal shoulder shrug and head rotation.   XII:  Normal tongue strength and range of motion, no deviation or fasciculation.  MOTOR:  Marked atrophy of the left FDI, ADM, and intrinsic hand muscles.  No fasciculations or abnormal movements.  No pronator drift.  Tone is normal.    Right Upper Extremity:    Left Upper Extremity:    Deltoid  5/5   Deltoid  5/5   Biceps  5/5   Biceps  5/5   Triceps  5/5   Triceps  5/5   Wrist extensors  5/5   Wrist extensors  5/5   Wrist flexors  5/5   Wrist flexors  5/5   Finger extensors  5/5   Finger extensors , EIP 5/5   Finger flexors  5/5   Finger flexors, FPL  5/5   Dorsal interossei  5/5   Dorsal interossei  4/5   Abductor pollicis  5/5   Abductor pollicis  5/5   Tone (Ashworth scale)  0  Tone (Ashworth scale)  0   Right Lower Extremity:    Left Lower Extremity:    Hip flexors  5/5   Hip flexors  5/5   Hip extensors  5/5   Hip extensors  5/5   Knee flexors  5/5   Knee flexors  5/5   Knee extensors  5/5   Knee extensors  5/5   Dorsiflexors  5/5   Dorsiflexors  5/5   Plantarflexors  5/5   Plantarflexors   5/5   Toe extensors  5/5   Toe extensors  5/5   Toe flexors  5/5   Toe flexors  5/5   Tone (Ashworth scale)  0  Tone (Ashworth scale)  0   MSRs:  Right                                                                 Left brachioradialis 2+  brachioradialis 2+  biceps 2+  biceps 2+  triceps 2+  triceps 2+  patellar 0  patellar 0  ankle jerk 0  ankle jerk 0  Hoffman no  Hoffman no  plantar response down  plantar response down   SENSORY:  Absent vibration distal to ankles bilaterally.  Light touch, temperature, and pin prick is reduced following a gradient pattern in the lower legs.  Rhomberg testing is markedly positive.   COORDINATION/GAIT: Normal finger-to- nose-finger.  Intact rapid alternating movements bilaterally.  Unaable to rise from a chair without using arms.  Gait is wide-based, mildly unsteady.  He cannot performed tandem or stressed gait.   IMPRESSION: 1.  Diabetic polyneuropathy causing sensory ataxia and numbness of the feet 2.  Bilateral proximal leg weakness most likely due to L2-4 radiculopathy 3.  Chronic left ulnar neuropathy causing hand weakness and muscle atrophy 4.  Chronic low back pain with multiple back surgeries, followed by Dr. Ellene Route 5.  Polyarthalgias, he will be seeing rheumatology for evaluation   PLAN/RECOMMENDATIONS:  1.  Check vitamin B12, copper, and SPEP with IFE 2.  Start PT for gait training and leg strengthening 3.  NCS/EMG declined as it likely would not change management 4.  If no improvement with PT, proceed with CT lumbar spine 5.  Lengthy discussion on fall precautions and encouraged to use a cane.  Being that he is on anticoagulation and fall risk, extra precautions were recommended especially due to potential risk of a devastating intracranial bleed.  Return to clinic in 3-4 months.   The duration of this appointment visit was 45 minutes of face-to-face time with the patient.  Greater than 50% of this time was spent in counseling,  explanation of diagnosis, planning of further management, and coordination of care.   Thank you for allowing me to participate in patient's care.  If I can answer any additional questions, I would be pleased to do so.    Sincerely,    Donika K. Posey Pronto, DO

## 2015-07-24 DIAGNOSIS — I5030 Unspecified diastolic (congestive) heart failure: Secondary | ICD-10-CM | POA: Diagnosis not present

## 2015-07-24 DIAGNOSIS — I4891 Unspecified atrial fibrillation: Secondary | ICD-10-CM | POA: Diagnosis not present

## 2015-07-24 DIAGNOSIS — J449 Chronic obstructive pulmonary disease, unspecified: Secondary | ICD-10-CM | POA: Diagnosis not present

## 2015-07-24 DIAGNOSIS — J9610 Chronic respiratory failure, unspecified whether with hypoxia or hypercapnia: Secondary | ICD-10-CM | POA: Diagnosis not present

## 2015-07-27 ENCOUNTER — Other Ambulatory Visit: Payer: Self-pay | Admitting: Gastroenterology

## 2015-07-27 LAB — PROTEIN ELECTROPHORESIS, SERUM
ALBUMIN ELP: 4.1 g/dL (ref 3.8–4.8)
Alpha-1-Globulin: 0.3 g/dL (ref 0.2–0.3)
Alpha-2-Globulin: 0.9 g/dL (ref 0.5–0.9)
Beta 2: 0.5 g/dL (ref 0.2–0.5)
Beta Globulin: 0.6 g/dL (ref 0.4–0.6)
Gamma Globulin: 0.9 g/dL (ref 0.8–1.7)
TOTAL PROTEIN, SERUM ELECTROPHOR: 7.3 g/dL (ref 6.1–8.1)

## 2015-07-27 LAB — IMMUNOFIXATION ELECTROPHORESIS
IGA: 427 mg/dL — AB (ref 68–379)
IGG (IMMUNOGLOBIN G), SERUM: 1050 mg/dL (ref 650–1600)
IGM, SERUM: 69 mg/dL (ref 41–251)

## 2015-07-27 LAB — COPPER, SERUM: COPPER: 169 ug/dL — AB (ref 72–166)

## 2015-07-28 ENCOUNTER — Telehealth: Payer: Self-pay | Admitting: Internal Medicine

## 2015-07-28 NOTE — Telephone Encounter (Signed)
RECALL FOR ABD ULTRASOUND 

## 2015-07-28 NOTE — Telephone Encounter (Signed)
Please look at the impression from the 06/2015 ov note that Dr.Rourk did. It says he is being maintained on spironolactone alone and lasix has been held.

## 2015-07-28 NOTE — Telephone Encounter (Signed)
Mailed letter °

## 2015-07-28 NOTE — Telephone Encounter (Signed)
We have not prescribed spironolactone since early 2016. Was not on this at last ov 06/2015. Please find out what is going on. Is the patient having problems?

## 2015-07-30 ENCOUNTER — Telehealth: Payer: Self-pay | Admitting: Internal Medicine

## 2015-07-30 ENCOUNTER — Other Ambulatory Visit: Payer: Self-pay

## 2015-07-30 DIAGNOSIS — K7460 Unspecified cirrhosis of liver: Secondary | ICD-10-CM

## 2015-07-30 NOTE — Telephone Encounter (Signed)
Pt is aware of [place] appt on 04/24 @0830 . Pt is aware he needs to be fasting

## 2015-07-30 NOTE — Telephone Encounter (Signed)
Pt received a letter about scheduling an U/S. Please call [phone]. If no answer leave a VM or you can speak with his wife.

## 2015-08-03 ENCOUNTER — Ambulatory Visit (HOSPITAL_COMMUNITY)
Admission: RE | Admit: 2015-08-03 | Discharge: 2015-08-03 | Disposition: A | Discharge status: Home / Self Care | Payer: Medicare HMO | Source: Ambulatory Visit | Attending: Internal Medicine | Admitting: Internal Medicine

## 2015-08-03 DIAGNOSIS — R161 Splenomegaly, not elsewhere classified: Secondary | ICD-10-CM | POA: Diagnosis not present

## 2015-08-03 DIAGNOSIS — K760 Fatty (change of) liver, not elsewhere classified: Secondary | ICD-10-CM | POA: Diagnosis not present

## 2015-08-03 DIAGNOSIS — Z9049 Acquired absence of other specified parts of digestive tract: Secondary | ICD-10-CM | POA: Insufficient documentation

## 2015-08-03 DIAGNOSIS — K7460 Unspecified cirrhosis of liver: Secondary | ICD-10-CM

## 2015-08-03 DIAGNOSIS — R69 Illness, unspecified: Secondary | ICD-10-CM | POA: Diagnosis not present

## 2015-08-05 ENCOUNTER — Other Ambulatory Visit: Payer: Self-pay

## 2015-08-05 MED ORDER — LEVOTHYROXINE SODIUM 112 MCG PO TABS: ORAL_TABLET | ORAL | Status: DC

## 2015-08-06 DIAGNOSIS — L409 Psoriasis, unspecified: Secondary | ICD-10-CM | POA: Diagnosis not present

## 2015-08-06 DIAGNOSIS — M79641 Pain in right hand: Secondary | ICD-10-CM | POA: Diagnosis not present

## 2015-08-06 DIAGNOSIS — M79642 Pain in left hand: Secondary | ICD-10-CM | POA: Diagnosis not present

## 2015-08-06 DIAGNOSIS — M2550 Pain in unspecified joint: Secondary | ICD-10-CM | POA: Diagnosis not present

## 2015-08-06 DIAGNOSIS — L4050 Arthropathic psoriasis, unspecified: Secondary | ICD-10-CM | POA: Diagnosis not present

## 2015-08-06 DIAGNOSIS — M5136 Other intervertebral disc degeneration, lumbar region: Secondary | ICD-10-CM | POA: Diagnosis not present

## 2015-08-10 ENCOUNTER — Ambulatory Visit (HOSPITAL_COMMUNITY): Payer: Medicare HMO | Attending: Neurology | Admitting: Physical Therapy

## 2015-08-10 ENCOUNTER — Encounter (HOSPITAL_COMMUNITY): Payer: Self-pay | Admitting: Physical Therapy

## 2015-08-10 DIAGNOSIS — M545 Low back pain: Secondary | ICD-10-CM | POA: Diagnosis not present

## 2015-08-10 DIAGNOSIS — R2689 Other abnormalities of gait and mobility: Secondary | ICD-10-CM | POA: Diagnosis not present

## 2015-08-10 DIAGNOSIS — R29898 Other symptoms and signs involving the musculoskeletal system: Secondary | ICD-10-CM | POA: Insufficient documentation

## 2015-08-10 DIAGNOSIS — R2681 Unsteadiness on feet: Secondary | ICD-10-CM | POA: Insufficient documentation

## 2015-08-10 DIAGNOSIS — M6281 Muscle weakness (generalized): Secondary | ICD-10-CM | POA: Diagnosis not present

## 2015-08-10 NOTE — Therapy (Signed)
[name], [place], [address] Phone: [phone]   Fax:  [phone]  Physical Therapy Evaluation  Patient Details  Name: [name] MRN: [id number] Date of Birth: 1951-01-12 Referring Provider: Narda Amber, MD  Encounter Date: 08/10/2015      PT End of Session - 08/10/15 1136    Visit Number 1   Number of Visits 16   Date for PT Re-Evaluation 09/07/15   Authorization Type [employer] Medicare   Authorization Time Period 08/10/15 to 10/06/15   Authorization - Visit Number 1   Authorization - Number of Visits 16   PT Start Time [id number]   PT Stop Time 1123   PT Time Calculation (min) 53 min   Activity Tolerance Patient tolerated treatment well   Behavior During Therapy [facility] for tasks assessed/performed      Past Medical History  Diagnosis Date  . Hypertension   . 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 Depressed   . Varicose vein     of esophagus  . CHF (congestive heart failure) ([place]) 01/06/2012  . Sinus pause 01/06/2012    5.2 seconds  . Anginal pain ([place])   . Sleep apnea     "don't wear mask" (01/06/2012)  . Emphysema   . Type II diabetes mellitus ([place])   . GERD (gastroesophageal reflux disease)   . H/O hiatal hernia   . Coughing up blood     "comes from my throat" (01/06/2012)  . Arthritis     "back; fingers" (01/06/2012)  . Chronic lower back pain   . Fatty liver disease, nonalcoholic   . CHB (complete heart block) ([place])   . Presence of permanent cardiac pacemaker 9/292013    [facility]  . Atrial flutter [facility])     s/p EPS +RF ablation of typical atrial flutter April 2015  . Pacemaker   . Stroke [facility])     pt states that he might have had a stroke not sure  . Pneumonia Aug 2016  . Hypothyroidism   . Headache   . DDD (degenerative disc disease), lumbar   . Cancer ([place])     skin cancer  . Difficult intubation     Eschmann stylet used in 2002 and 2007; "trouble waking up afterwards" (01/06/2012)     Past Surgical History  Procedure Laterality Date  . Back surgery    . Spinal cord stimulator implant  2006  . Cholecystectomy  1993  . Nasal septum surgery  1992  . Tonsillectomy and adenoidectomy  1992  . Posterior fusion lumbar spine  1999    L4-5  . Lumbar disc surgery  1994; ~ 1995; ~ 1996  . Warthin's tumor excision  1990's    right  . Permanent pacemaker insertion  01/08/2012    CHB  . US echocardiography  12/28/2011    mild LVH,mild mitral annulara ca+,mild MR  . Nuclear stress test  10/19/2004    No ischemia  . Esophagogastroduodenoscopy  11/08/2004    [id number] esophageal erosions consistent with erosive reflux esophagitis/Areas of hemorrhage and nodularity of the fundal mucosa of uncertain significance, biopsied.  Small hiatal hernia, otherwise normal stomach  . Colonoscopy  11/08/2004    [id number] rectum, colon, TI.  [facility] Esophagogastroduodenoscopy  2010    Dr. Gala [name]: 3 columns Grade 1 varices, erosive esophagitis, HH, portal gastropathy, normal D1, D2  . Esophagogastroduodenoscopy (egd) with esophageal dilation N/A 02/14/2013    [id number] esophageal  varices. Abnormal distal esophagus/status post biopsy after Maloney dilation. Portal gastropathy. Antral erosions-status post biopsy. path negative for H.pylori, benign path.  . Permanent pacemaker insertion N/A 01/09/2012    Procedure: PERMANENT PACEMAKER INSERTION;  Surgeon: Sanda Klein, MD;  Location: [facility] CATH LAB;  Service: Cardiovascular;  Laterality: N/A;  . Atrial flutter ablation N/A 07/10/2013    Procedure: ATRIAL FLUTTER ABLATION;  Surgeon: Evans Lance, MD;  Location: [facility] CATH LAB;  Service: Cardiovascular;  Laterality: N/A;  . Colonoscopy N/A 05/28/2014    Dr. Gala [name]: Redundant colon. single colonic polyp removed as described above. Tubular adenoma  . Esophagogastroduodenoscopy N/A 05/28/2014    Dr. Gala [name]: MIld erosive reflux esophagitis. Grade 1 esophageal varices. Patent esophagus. No dilation performed. Hiatal  hernia.   . Esophageal dilation N/A 05/28/2014    Procedure: ESOPHAGEAL DILATION;  Surgeon: [place] Dolin, MD;  [facility];  Service: Endoscopy;  Laterality: N/A;  . Spinal cord stimulator removal N/A 01/27/2015    Procedure: LUMBAR SPINAL CORD STIMULATOR REMOVAL;  Surgeon: [place] Miss, MD;  Location: [facility] NEURO ORS;  Service: Neurosurgery;  Laterality: N/A;  LUMBAR SPINAL CORD STIMULATOR REMOVAL    There were no vitals filed for this visit.       Subjective Assessment - 08/10/15 1032    Subjective Pt notes his balance and LE strength has gotten progressively worse in the past year or so. States he has had several back surgeries in the past. He feels he had a stroke several years ago, but is not sure due to the fact he can't have and MRI. He is usually in pain from his neuropathy (knees/feet) and sometimes has "twinges" of pain in his low back. He is unable to clarify what aggravates/alleviates his symptoms. States his biggest issue currenttly is his LE weakness and balance.    Pertinent History HTN, DMII, pacemaker, L4-L5 fusion   Limitations Sitting;Standing;House hold activities   How long can you sit comfortably? 1.5 to 2 hours   How long can you stand comfortably? 5 minutes before he needs to prop his arms on something   How long can you walk comfortably? unlimited as long as he has something to hold onto   Patient Stated Goals get stronger and mobility.   Currently in Pain? Yes   Pain Score 8    Pain Location --  BLE   Pain Orientation Right;Left   Pain Descriptors / Indicators Aching   Pain Type Chronic pain  last 6-8 months   Pain Onset More than a month ago   Pain Frequency Constant   Aggravating Factors  nothing   Pain Relieving Factors nothing   Effect of Pain on Daily Activities affecting balance/ADLs   Multiple Pain Sites Yes   Pain Score 0   Pain Location Back   Pain Orientation Mid;Lower   Pain Descriptors / Indicators Stabbing   Pain Type Chronic pain    Pain Onset More than a month ago  years   Pain Frequency Intermittent   Aggravating Factors  unsure, but standing doesn't make it worse   Pain Relieving Factors nothing            [facility] PT Assessment - 08/10/15 0001    Assessment   Medical Diagnosis B LE weakness, Lumbosacral radiculopathy   Referring Provider [place] Amber, MD   Onset Date/Surgical Date --  several years ago   Next MD Visit none as of now   Prior Therapy No prior therapy    Precautions  Precautions None   Balance Screen   Has the patient fallen in the past 6 months Yes   How many times? 3  and many close calls   Has the patient had a decrease in activity level because of a fear of falling?  No   Is the patient reluctant to leave their home because of a fear of falling?  No   Home Environment   Living Environment Private residence   Living Arrangements Spouse/significant other   Type of [place] entrance   Prior Function   Level of [place] Retired   Leisure go fishing   Observation/Other Assessments   Observations --   Engineer, production Intact   Posture/Postural Control   Posture/Postural Control Postural limitations   Postural Limitations Rounded Shoulders;Forward head;Decreased lumbar lordosis   Posture Comments Pt sitting slouched to the L   ROM / Strength   AROM / PROM / Strength AROM;Strength   AROM   AROM Assessment Site Lumbar   Lumbar Flexion 6 inches above toes, pull in back of knees   Lumbar Extension repeated extension x10 reps, end range pain   Lumbar - Right Side Bend 4in above jt line  pain during movement   Lumbar - Left Side Bend jt line  pain with returning to position   Lumbar - Right Rotation 50% limited   Lumbar - Left Rotation 25% limited   Strength   Strength Assessment Site Hip;Knee;Ankle   Right/Left Hip Right;Left   Right Hip Flexion 3+/5   Right Hip Extension 4-/5   Right Hip ABduction 4+/5    Left Hip Flexion 4-/5   Left Hip Extension 4-/5   Left Hip ABduction 3+/5   Right/Left Knee Right;Left   Right Knee Flexion 4-/5   Right Knee Extension 4+/5   Left Knee Flexion 4-/5   Left Knee Extension 4+/5   Right/Left Ankle Right;Left   Right Ankle Dorsiflexion 4+/5   Left Ankle Dorsiflexion 4+/5   Flexibility   Soft Tissue Assessment /Muscle Length yes   Hamstrings L/R: 38/30 deg  L into low back   Quadriceps (+) thomas for B hip flexor tightness   Piriformis 50% limited B   Palpation   Palpation comment unable to assess this session    Special Tests    Special Tests Lumbar   Lumbar Tests Straight Leg Raise   Straight Leg Raise   Findings Positive   Side  Left   Comment L symptoms behind knee and hip; R symptoms behind hip and ankle   Ambulation/Gait   Ambulation/Gait Yes   Standardized Balance Assessment   Standardized Balance Assessment Timed Up and Go Test;Berg Balance Test;Five Times Sit to Stand   Five times sit to stand comments  64 sec with intermittent UE use   Berg Balance Test   Sit to Stand Able to stand using hands after several tries   Standing Unsupported Able to stand safely 2 minutes   Sitting with Back Unsupported but Feet Supported on Floor or Stool Able to sit safely and securely 2 minutes   Stand to Sit Controls descent by using hands   Transfers Able to transfer safely, definite need of hands   Standing Unsupported with Eyes Closed Able to stand 10 seconds with supervision  anterior lean   Standing Ubsupported with Feet Together Able to place feet together independently and stand for 1 minute with supervision  postural sway noted  From Standing, Reach Forward with Outstretched Arm Can reach forward >12 cm safely (5")  6"   From Standing Position, Pick up Object from [place] to pick up shoe, needs supervision   From Standing Position, Turn to Look Behind Over each Shoulder Looks behind from both sides and weight shifts well   Turn 360 Degrees  Able to turn 360 degrees safely but slowly  6sec   Standing Unsupported, Alternately Place Feet on Step/Stool Able to stand independently and complete 8 steps >20 seconds   Standing Unsupported, One Foot in Front Able to plae foot ahead of the other independently and hold 30 seconds  Rt and Lt   Standing on One Leg Tries to lift leg/unable to hold 3 seconds but remains standing independently  L: 3 sec, R: 2 sec   Total Score 41   Timed Up and Go Test   TUG Normal TUG   Normal TUG (seconds) 20.13                           PT Education - 08/10/15 1135    Education provided Yes   Education Details Discussed eval findings and POC/frequency   Person(s) Educated Patient   Methods Explanation   Comprehension Verbalized understanding          PT Short Term Goals - 08/10/15 1142    PT SHORT TERM GOAL #1   Title Pt will demo understanding and indep with updated HEP.   Time 2   Period Weeks   Status New   PT SHORT TERM GOAL #2   Title Pt will correctly repeat and demo correct log rolling technique during sit to/from supine without cuing from therapist   Time 2   Period Weeks   Status New   PT SHORT TERM GOAL #3   Title Pt will demo improved pain report of no greater than 5/10 throughout the day to improve his participation in ADLs.   Time 4   Period Weeks   Status New           PT Long Term Goals - 08/10/15 1145    PT LONG TERM GOAL #1   Title Pt will demo improved LE strength to atleast 4/5 throughout BLE to improve his safety with sit to stand transitions.    Time 8   Period Weeks   Status New   PT LONG TERM GOAL #2   Title Pt will demo improved functional strength and endurance evident by an improvement in 5x sit to stand to less than 30 sec without use of UE   Baseline 64 sec   Time 8   Period Weeks   Status New   PT LONG TERM GOAL #3   Title Pt will demonstrate improved TUG time in less than 14 sec and without and AD to decrease his risk of  falls in the community.    Time 8   Period Weeks   Status New   PT LONG TERM GOAL #4   Title Pt will demonstrate improved balance evident by a Berg Balance Score improvement of atleast 4 points to decrease his risk of falls and injury.   Time 8   Period Weeks   Status New   PT LONG TERM GOAL #5   Title Pt will report decraese in pain to no greater than 3/10 to improve his tolerance of activity and ADLs.    Time 8   Period Weeks  Status New               Plan - 08/10/15 1137    Clinical Impression Statement Mr. Agne is 65yo M referred to OPPT for treatment of chronic BLE weakness and low back pain which seems to be getting worse. Further examination revealed significant limitations in LE weakness, flexibility and lumbar ROM. Also noting poor performance on functional testing with TUG and 5x sit to stand as well as 41/56 on his [place]ri[place] Jansky, all which put him at an increased risk of falls at home and in the community. Pt noting increased time needed to complete tasks as well as several falls in the past few months and is currently using a SPC at the recommendation of Dr. Posey Pronto. At this time, he feels his LE strength and his balance are the biggest concerns for him, and therapist educated him on the benefits of improved LE/core strength for both balance and back pain. Discussed eval findings with the pt and he is in agreement with proposed POC and PT frequency.   Rehab Potential Fair   Clinical Impairments Affecting Rehab Potential chronicity of back pain   PT Frequency 2x / week   PT Duration 8 weeks   PT Treatment/Interventions ADLs/Self Care Home Management;Aquatic Therapy;Moist Heat;Patient/family education;Neuromuscular re-education;Balance training;Therapeutic exercise;Therapeutic activities;Functional mobility training;Stair training;Gait training;Manual techniques;Passive range of motion;Energy conservation   PT Next Visit Plan Have pt fill out ABC scale and provide initial HEP of  trunk stabilization, LE flexibility and strengthening therex. Further assess LE soft tissue restrictions/TrP in glute region?  Address body mechanics during bed mobility. Begin with trunk stabilization and LE strengthening   PT Home Exercise Plan Next visit   Recommended Other Services none   Consulted and Agree with Plan of Care Patient      Patient will benefit from skilled therapeutic intervention in order to improve the following deficits and impairments:  Abnormal gait, Decreased activity tolerance, Decreased balance, Decreased endurance, Decreased safety awareness, Decreased range of motion, Decreased mobility, Decreased strength, Difficulty walking, Hypomobility, Impaired flexibility, Increased muscle spasms, Improper body mechanics, Postural dysfunction, Pain  Visit Diagnosis: Muscle weakness (generalized) - Plan: PT plan of care cert/re-cert  Other symptoms and signs involving the musculoskeletal system - Plan: PT plan of care cert/re-cert  Unsteadiness on feet - Plan: PT plan of care cert/re-cert  Other abnormalities of gait and mobility - Plan: PT plan of care cert/re-cert  Midline low back pain, with sciatica presence unspecified - Plan: PT plan of care cert/re-cert      G-Codes - [id number]    Functional Limitation Mobility: Walking and moving around   Mobility: Walking and Moving Around Current Status [phone]) At least 60 percent but less than 80 percent impaired, limited or restricted   Mobility: Walking and Moving Around Goal Status [phone]) At least 40 percent but less than 60 percent impaired, limited or restricted       Problem List Patient Active Problem List   Diagnosis Date Noted  . Hyperlipidemia 04/02/2015  . Essential hypertension, benign 04/02/2015  . Other specified hypothyroidism 04/02/2015  . History of colonic polyps   . Encounter for screening colonoscopy 05/07/2014  . Psoriasis 10/15/2013  . Hypoxemia 09/05/2013  . SVT (supraventricular  tachycardia) ([place]) 06/06/2013  . COPD (chronic obstructive pulmonary disease) ([place]) 06/06/2013  . Cardiac pacemaker in situ 06/06/2013  . Pacemaker 05/22/2013  . Cirrhosis ([place]) 01/22/2013  . Dyspepsia 01/22/2013  . Anemia 01/22/2013  . Atrial fibrillation ([place]) 11/21/2012  .  Atrial flutter ([place]) 11/21/2012  . Chest pain 11/20/2012  .  Acute on chronic diastolic heart failure, predominantly Right heart failure 01/09/2012  . Syncope 01/06/2012  . Sinus arrest, 5 second pause on event monitor 01/06/2012  . Type II diabetes mellitus, uncontrolled ([employer]) 01/06/2012  . Obesity 01/06/2012  . Smoker 01/06/2012  . Obstructive sleep apnea 01/06/2012  . DJD, on disability secondary to back pain, surg X 3 01/06/2012  . ABDOMINAL PAIN, RIGHT UPPER QUADRANT 06/14/2009  . ABDOMINAL PAIN 05/14/2009  . Hepatic cirrhosis ([place]) 01/13/2009  . GERD 12/04/2008  . GASTRITIS, ACUTE 12/04/2008  . HOARSENESS 12/04/2008  . CHEST PAIN 12/04/2008  . Dysphagia, pharyngoesophageal phase 12/04/2008   1:12 PM,08/10/2015 Elly Modena PT, DPT [facility] [place] [address] [place], [place], [address] Phone: [phone]   Fax:  [phone]  Name: MINTON RAVENSCROFT MRN: [id number] Date of Birth: September 11, 1950

## 2015-08-14 ENCOUNTER — Ambulatory Visit (HOSPITAL_COMMUNITY): Payer: Medicare HMO

## 2015-08-14 DIAGNOSIS — M6281 Muscle weakness (generalized): Secondary | ICD-10-CM | POA: Diagnosis not present

## 2015-08-14 DIAGNOSIS — R29898 Other symptoms and signs involving the musculoskeletal system: Secondary | ICD-10-CM

## 2015-08-14 DIAGNOSIS — R2689 Other abnormalities of gait and mobility: Secondary | ICD-10-CM

## 2015-08-14 DIAGNOSIS — M545 Low back pain: Secondary | ICD-10-CM

## 2015-08-14 DIAGNOSIS — R2681 Unsteadiness on feet: Secondary | ICD-10-CM

## 2015-08-14 NOTE — Therapy (Signed)
[name], [place], [address] Phone: [phone]   Fax:  [phone]  Physical Therapy Treatment  Patient Details  Name: Ralph [name] MRN: [id number] Date of Birth: 09-Jul-1950 Referring Provider: Narda Amber, MD  Encounter Date: 08/14/2015      PT End of Session - 08/14/15 0941    Visit Number 2   Number of Visits 16   Date for PT Re-Evaluation 09/07/15   Authorization Type AETNA Medicare   Authorization Time Period 08/10/15 to 10/06/15   Authorization - Visit Number 2   Authorization - Number of Visits 16   PT Start Time 0817   PT Stop Time 0908   PT Time Calculation (min) 51 min      Past Medical History  Diagnosis Date  . Hypertension   . 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 Depressed   . Varicose vein     of esophagus  . CHF (congestive heart failure) ([place]) 01/06/2012  . Sinus pause 01/06/2012    5.2 seconds  . Anginal pain ([place])   . Sleep apnea     "don't wear mask" (01/06/2012)  . Emphysema   . Type II diabetes mellitus ([place])   . GERD (gastroesophageal reflux disease)   . H/O hiatal hernia   . Coughing up blood     "comes from my throat" (01/06/2012)  . Arthritis     "back; fingers" (01/06/2012)  . Chronic lower back pain   . Fatty liver disease, nonalcoholic   . CHB (complete heart block) ([place])   . Presence of permanent cardiac pacemaker 9/292013    [employer]  . Atrial flutter [facility])     s/p EPS +RF ablation of typical atrial flutter April 2015  . Pacemaker   . Stroke [facility])     pt states that he might have had a stroke not sure  . Pneumonia Aug 2016  . Hypothyroidism   . Headache   . DDD (degenerative disc disease), lumbar   . Cancer ([place])     skin cancer  . Difficult intubation     Eschmann stylet used in 2002 and 2007; "trouble waking up afterwards" (01/06/2012)    Past Surgical History  Procedure Laterality Date  . Back surgery    . Spinal cord stimulator implant  2006   . Cholecystectomy  1993  . Nasal septum surgery  1992  . Tonsillectomy and adenoidectomy  1992  . Posterior fusion lumbar spine  1999    L4-5  . Lumbar disc surgery  1994; ~ 1995; ~ 1996  . Warthin's tumor excision  1990's    right  . Permanent pacemaker insertion  01/08/2012    CHB  . US echocardiography  12/28/2011    mild LVH,mild mitral annulara ca+,mild MR  . Nuclear stress test  10/19/2004    No ischemia  . Esophagogastroduodenoscopy  11/08/2004    [id number] esophageal erosions consistent with erosive reflux esophagitis/Areas of hemorrhage and nodularity of the fundal mucosa of uncertain significance, biopsied.  Small hiatal hernia, otherwise normal stomach  . Colonoscopy  11/08/2004    [id number] rectum, colon, TI.  [facility] Kitchen Esophagogastroduodenoscopy  2010    Dr. Gala Romney: 3 columns Grade 1 varices, erosive esophagitis, HH, portal gastropathy, normal D1, D2  . Esophagogastroduodenoscopy (egd) with esophageal dilation N/A 02/14/2013    [id number] esophageal varices. Abnormal distal esophagus/status post biopsy after Maloney dilation. Portal gastropathy. Antral erosions-status post biopsy. path negative  for H.pylori, benign path.  . Permanent pacemaker insertion N/A 01/09/2012    Procedure: PERMANENT PACEMAKER INSERTION;  Surgeon: Sanda Klein, MD;  Location: [facility] CATH LAB;  Service: Cardiovascular;  Laterality: N/A;  . Atrial flutter ablation N/A 07/10/2013    Procedure: ATRIAL FLUTTER ABLATION;  Surgeon: Evans Lance, MD;  Location: [facility] CATH LAB;  Service: Cardiovascular;  Laterality: N/A;  . Colonoscopy N/A 05/28/2014    Dr. Gala Romney: Redundant colon. single colonic polyp removed as described above. Tubular adenoma  . Esophagogastroduodenoscopy N/A 05/28/2014    Dr. Gala Romney: MIld erosive reflux esophagitis. Grade 1 esophageal varices. Patent esophagus. No dilation performed. Hiatal hernia.   . Esophageal dilation N/A 05/28/2014    Procedure: ESOPHAGEAL DILATION;  Surgeon: Daneil Dolin, MD;   [facility];  Service: Endoscopy;  Laterality: N/A;  . Spinal cord stimulator removal N/A 01/27/2015    Procedure: LUMBAR SPINAL CORD STIMULATOR REMOVAL;  Surgeon: Kristeen Miss, MD;  Location: [facility] NEURO ORS;  Service: Neurosurgery;  Laterality: N/A;  LUMBAR SPINAL CORD STIMULATOR REMOVAL    There were no vitals filed for this visit.      Subjective Assessment - 08/14/15 0820    Subjective Pt reports doing ok today, still in pain, but continuing on with life. Reports no significant changes since Monday.    Pertinent History HTN, DMII, pacemaker, L4-L5 fusion   Limitations Sitting;Standing;House hold activities   Currently in Pain? Yes   Pain Score 8    Pain Location Knee   Pain Orientation Right;Left   Pain Descriptors / Indicators Aching   Pain Type Chronic pain   Pain Onset More than a month ago                         [facility] Adult PT Treatment/Exercise - 08/14/15 0001    Exercises   Exercises Knee/Hip   Knee/Hip Exercises: Supine   Other Supine Knee/Hip Exercises SKTC 3x30sec bilat   Other Supine Knee/Hip Exercises Supine LAQ 10x10sec bilat   Manual Therapy   Joint Mobilization knee distration mobs 2x45s: R improved, L worse  Patella mobs in 4 directions bilat x13min   Manual Traction Hip pull x80min bilat                PT Education - 08/14/15 0905    Education provided Yes   Education Details discussed diagnostic role of manual therapy in session today.    Person(s) Educated Patient   Methods Explanation   Comprehension Verbalized understanding          PT Short Term Goals - 08/10/15 1142    PT SHORT TERM GOAL #1   Title Pt will demo understanding and indep with updated HEP.   Time 2   Period Weeks   Status New   PT SHORT TERM GOAL #2   Title Pt will correctly repeat and demo correct log rolling technique during sit to/from supine without cuing from therapist   Time 2   Period Weeks   Status New   PT SHORT TERM GOAL #3    Title Pt will demo improved pain report of no greater than 5/10 throughout the day to improve his participation in ADLs.   Time 4   Period Weeks   Status New           PT Long Term Goals - 08/10/15 1145    PT LONG TERM GOAL #1   Title Pt will demo improved LE strength to  atleast 4/5 throughout BLE to improve his safety with sit to stand transitions.    Time 8   Period Weeks   Status New   PT LONG TERM GOAL #2   Title Pt will demo improved functional strength and endurance evident by an improvement in 5x sit to stand to less than 30 sec without use of UE   Baseline 64 sec   Time 8   Period Weeks   Status New   PT LONG TERM GOAL #3   Title Pt will demonstrate improved TUG time in less than 14 sec and without and AD to decrease his risk of falls in the community.    Time 8   Period Weeks   Status New   PT LONG TERM GOAL #4   Title Pt will demonstrate improved balance evident by a Berg Balance Score improvement of atleast 4 points to decrease his risk of falls and injury.   Time 8   Period Weeks   Status New   PT LONG TERM GOAL #5   Title Pt will report decraese in pain to no greater than 3/10 to improve his tolerance of activity and ADLs.    Time 8   Period Weeks   Status New               Plan - 08/14/15 1111    Clinical Impression Statement Pt doing well today overall, no major change since eval 4DA. Manual therapy performed on his knees and hip provdes mixed results, with improved pain on R knee and bilat hips and increased pain in L knee. Pt is able to perform HS stretches s exacerbation of pain. Low back pulling sensation with longitudinal hip distraction, but not concerning to patient. HEP given. Progress toward goals is evident.    Rehab Potential Fair   Clinical Impairments Affecting Rehab Potential chronicity of back pain   PT Frequency 2x / week   PT Duration 8 weeks   PT Treatment/Interventions ADLs/Self Care Home Management;Aquatic Therapy;Moist  Heat;Patient/family education;Neuromuscular re-education;Balance training;Therapeutic exercise;Therapeutic activities;Functional mobility training;Stair training;Gait training;Manual techniques;Passive range of motion;Energy conservation   PT Next Visit Plan ABC scale and provide, add to HEP of trunk stabilization, LE flexibility.  Further assess LE soft tissue restrictions/TrP in glute region.    PT Home Exercise Plan Started, given.    Consulted and Agree with Plan of Care Patient      Patient will benefit from skilled therapeutic intervention in order to improve the following deficits and impairments:  Abnormal gait, Decreased activity tolerance, Decreased balance, Decreased endurance, Decreased safety awareness, Decreased range of motion, Decreased mobility, Decreased strength, Difficulty walking, Hypomobility, Impaired flexibility, Increased muscle spasms, Improper body mechanics, Postural dysfunction, Pain  Visit Diagnosis: Muscle weakness (generalized)  Other symptoms and signs involving the musculoskeletal system  Unsteadiness on feet  Other abnormalities of gait and mobility  Midline low back pain, with sciatica presence unspecified     Problem List Patient Active Problem List   Diagnosis Date Noted  . Hyperlipidemia 04/02/2015  . Essential hypertension, benign 04/02/2015  . Other specified hypothyroidism 04/02/2015  . History of colonic polyps   . Encounter for screening colonoscopy 05/07/2014  . Psoriasis 10/15/2013  . Hypoxemia 09/05/2013  . SVT (supraventricular tachycardia) ([place]) 06/06/2013  . COPD (chronic obstructive pulmonary disease) ([place]) 06/06/2013  . Cardiac pacemaker in situ 06/06/2013  . Pacemaker 05/22/2013  . Cirrhosis ([place]) 01/22/2013  . Dyspepsia 01/22/2013  . Anemia 01/22/2013  . Atrial fibrillation ([place]) 11/21/2012  .  Atrial flutter ([place]) 11/21/2012  . Chest pain 11/20/2012  .  Acute on chronic diastolic heart failure, predominantly Right heart  failure 01/09/2012  . Syncope 01/06/2012  . Sinus arrest, 5 second pause on event monitor 01/06/2012  . Type II diabetes mellitus, uncontrolled ([place]) 01/06/2012  . Obesity 01/06/2012  . Smoker 01/06/2012  . Obstructive sleep apnea 01/06/2012  . DJD, on disability secondary to back pain, surg X 3 01/06/2012  . ABDOMINAL PAIN, RIGHT UPPER QUADRANT 06/14/2009  . ABDOMINAL PAIN 05/14/2009  . Hepatic cirrhosis ([place]) 01/13/2009  . GERD 12/04/2008  . GASTRITIS, ACUTE 12/04/2008  . HOARSENESS 12/04/2008  . CHEST PAIN 12/04/2008  . Dysphagia, pharyngoesophageal phase 12/04/2008    11:18 AM, 08/14/2015 Etta Grandchild, PT, DPT PRN Physical Therapist - [place] License # [id number] [phone] (mobile)   [place] [facility] [address] [place], [place], [address] Phone: [phone]   Fax:  [phone]  Name: DANIEL TIENKEN MRN: [id number] Date of Birth: [birth year]

## 2015-08-18 ENCOUNTER — Other Ambulatory Visit: Payer: Self-pay

## 2015-08-18 ENCOUNTER — Telehealth: Payer: Self-pay | Admitting: Internal Medicine

## 2015-08-18 MED ORDER — LEVOTHYROXINE SODIUM 112 MCG PO TABS: ORAL_TABLET | ORAL | Status: DC

## 2015-08-18 NOTE — Telephone Encounter (Signed)
[phone]  Patient came into office inquiring if his prescription of nadolol 40mg  can be sent in to [facility] as a 90 day supply    Please advise

## 2015-08-19 ENCOUNTER — Ambulatory Visit (HOSPITAL_COMMUNITY): Payer: Medicare HMO | Admitting: Physical Therapy

## 2015-08-19 DIAGNOSIS — M6281 Muscle weakness (generalized): Secondary | ICD-10-CM

## 2015-08-19 DIAGNOSIS — R2681 Unsteadiness on feet: Secondary | ICD-10-CM

## 2015-08-19 DIAGNOSIS — R29898 Other symptoms and signs involving the musculoskeletal system: Secondary | ICD-10-CM

## 2015-08-19 DIAGNOSIS — R2689 Other abnormalities of gait and mobility: Secondary | ICD-10-CM

## 2015-08-19 MED ORDER — NADOLOL 40 MG PO TABS: ORAL_TABLET | ORAL | Status: DC

## 2015-08-19 NOTE — Telephone Encounter (Signed)
done

## 2015-08-19 NOTE — Therapy (Signed)
[place], [place], [address] Phone: [phone]   Fax:  [phone]  Physical Therapy Treatment  Patient Details  Name: Ralph [name] MRN: [id number] Date of Birth: 10/14/1950 Referring Provider: Narda Amber, MD  Encounter Date: 08/19/2015      PT End of Session - 08/19/15 1011    Visit Number 3   Number of Visits 16   Date for PT Re-Evaluation 09/07/15   Authorization Type AETNA Medicare   Authorization Time Period 08/10/15 to 10/06/15   Authorization - Visit Number 3   Authorization - Number of Visits 16   PT Start Time 0950   PT Stop Time 1035   PT Time Calculation (min) 45 min   Activity Tolerance Patient tolerated treatment well   Behavior During Therapy [facility] for tasks assessed/performed      Past Medical History  Diagnosis Date  . Hypertension   . 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 Depressed   . Varicose vein     of esophagus  . CHF (congestive heart failure) ([place]) 01/06/2012  . Sinus pause 01/06/2012    5.2 seconds  . Anginal pain ([place])   . Sleep apnea     "don't wear mask" (01/06/2012)  . Emphysema   . Type II diabetes mellitus ([place])   . GERD (gastroesophageal reflux disease)   . H/O hiatal hernia   . Coughing up blood     "comes from my throat" (01/06/2012)  . Arthritis     "back; fingers" (01/06/2012)  . Chronic lower back pain   . Fatty liver disease, nonalcoholic   . CHB (complete heart block) ([place])   . Presence of permanent cardiac pacemaker 9/292013    [employer]  . Atrial flutter [facility])     s/p EPS +RF ablation of typical atrial flutter April 2015  . Pacemaker   . Stroke [facility])     pt states that he might have had a stroke not sure  . Pneumonia Aug 2016  . Hypothyroidism   . Headache   . DDD (degenerative disc disease), lumbar   . Cancer ([place])     skin cancer  . Difficult intubation     Eschmann stylet used in 2002 and 2007; "trouble waking up afterwards" (01/06/2012)     Past Surgical History  Procedure Laterality Date  . Back surgery    . Spinal cord stimulator implant  2006  . Cholecystectomy  1993  . Nasal septum surgery  1992  . Tonsillectomy and adenoidectomy  1992  . Posterior fusion lumbar spine  1999    L4-5  . Lumbar disc surgery  1994; ~ 1995; ~ 1996  . Warthin's tumor excision  1990's    right  . Permanent pacemaker insertion  01/08/2012    CHB  . US echocardiography  12/28/2011    mild LVH,mild mitral annulara ca+,mild MR  . Nuclear stress test  10/19/2004    No ischemia  . Esophagogastroduodenoscopy  11/08/2004    [id number] esophageal erosions consistent with erosive reflux esophagitis/Areas of hemorrhage and nodularity of the fundal mucosa of uncertain significance, biopsied.  Small hiatal hernia, otherwise normal stomach  . Colonoscopy  11/08/2004    [id number] rectum, colon, TI.  Marland Kitchen Esophagogastroduodenoscopy  2010    Dr. Gala Romney: 3 columns Grade 1 varices, erosive esophagitis, HH, portal gastropathy, normal D1, D2  . Esophagogastroduodenoscopy (egd) with esophageal dilation N/A 02/14/2013    [id number] esophageal  varices. Abnormal distal esophagus/status post biopsy after Maloney dilation. Portal gastropathy. Antral erosions-status post biopsy. path negative for H.pylori, benign path.  . Permanent pacemaker insertion N/A 01/09/2012    Procedure: PERMANENT PACEMAKER INSERTION;  Surgeon: Sanda Klein, MD;  Location: [facility] CATH LAB;  Service: Cardiovascular;  Laterality: N/A;  . Atrial flutter ablation N/A 07/10/2013    Procedure: ATRIAL FLUTTER ABLATION;  Surgeon: Evans Lance, MD;  Location: [facility] CATH LAB;  Service: Cardiovascular;  Laterality: N/A;  . Colonoscopy N/A 05/28/2014    Dr. Gala Romney: Redundant colon. single colonic polyp removed as described above. Tubular adenoma  . Esophagogastroduodenoscopy N/A 05/28/2014    Dr. Gala Romney: MIld erosive reflux esophagitis. Grade 1 esophageal varices. Patent esophagus. No dilation performed. Hiatal  hernia.   . Esophageal dilation N/A 05/28/2014    Procedure: ESOPHAGEAL DILATION;  Surgeon: Daneil Dolin, MD;  [facility];  Service: Endoscopy;  Laterality: N/A;  . Spinal cord stimulator removal N/A 01/27/2015    Procedure: LUMBAR SPINAL CORD STIMULATOR REMOVAL;  Surgeon: Kristeen Miss, MD;  Location: [facility] NEURO ORS;  Service: Neurosurgery;  Laterality: N/A;  LUMBAR SPINAL CORD STIMULATOR REMOVAL    There were no vitals filed for this visit.      Subjective Assessment - 08/19/15 1012    Subjective Pt states he has been doing knee to chest and supine hamstring stretch at home.  States his pain remains high at 8/10 today in his central low back.  No sciatica but has diabetic neuropathy in his calves and feet.    Currently in Pain? Yes   Pain Score 8    Pain Location Back   Pain Orientation Mid;Lower                         [facility] Adult PT Treatment/Exercise - 08/19/15 0001    Knee/Hip Exercises: Stretches   Active Hamstring Stretch Both;3 reps;30 seconds   Active Hamstring Stretch Limitations standing 12"   Knee/Hip Exercises: Standing   Heel Raises Both;10 reps   Heel Raises Limitations toeraises 10 reps   Knee/Hip Exercises: Supine   Bridges with Ball Squeeze Both;10 reps  no ball   Straight Leg Raises Both;10 reps   Other Supine Knee/Hip Exercises SKTC 3x30sec bilat   Knee/Hip Exercises: Prone   Hamstring Curl 10 reps   Hip Extension 10 reps                PT Education - 08/19/15 1010    Education provided Yes   Education Details given HEP and copy of initial evaluation with explanation of goals   Person(s) Educated Patient   Methods Explanation;Demonstration;Tactile cues;Verbal cues;Handout   Comprehension Verbalized understanding;Returned demonstration;Verbal cues required;Tactile cues required;Need further instruction          PT Short Term Goals - 08/19/15 1010    PT SHORT TERM GOAL #1   Title Pt will demo understanding and  indep with updated HEP.   Time 2   Period Weeks   Status On-going   PT SHORT TERM GOAL #2   Title Pt will correctly repeat and demo correct log rolling technique during sit to/from supine without cuing from therapist   Time 2   Period Weeks   Status On-going   PT SHORT TERM GOAL #3   Title Pt will demo improved pain report of no greater than 5/10 throughout the day to improve his participation in ADLs.   Time 4   Period Weeks  Status On-going           PT Long Term Goals - 08/19/15 1011    PT LONG TERM GOAL #1   Title Pt will demo improved LE strength to atleast 4/5 throughout BLE to improve his safety with sit to stand transitions.    Time 8   Period Weeks   Status On-going   PT LONG TERM GOAL #2   Title Pt will demo improved functional strength and endurance evident by an improvement in 5x sit to stand to less than 30 sec without use of UE   Baseline 64 sec   Time 8   Period Weeks   Status On-going   PT LONG TERM GOAL #3   Title Pt will demonstrate improved TUG time in less than 14 sec and without and AD to decrease his risk of falls in the community.    Time 8   Period Weeks   Status On-going   PT LONG TERM GOAL #4   Title Pt will demonstrate improved balance evident by a Berg Balance Score improvement of atleast 4 points to decrease his risk of falls and injury.   Time 8   Period Weeks   Status On-going   PT LONG TERM GOAL #5   Title Pt will report decraese in pain to no greater than 3/10 to improve his tolerance of activity and ADLs.    Time 8   Period Weeks   Status On-going               Plan - 08/19/15 1016    Clinical Impression Statement Pt ambulating with a SPC and overall slow gait.  Reviewed initial evaluation and updated HEP with addition of supine bridge, SLR,. prone SLR, standing hamstring stretch/heel and toe raises. Pt wtih limited ROM completing supine SLR and bridge due to discomfort and weakness at this point.  Pt wtihout increased  symptoms or pain at end of session.  Encouraged patient to complete HEP 2X daily.    Rehab Potential Fair   Clinical Impairments Affecting Rehab Potential chronicity of back pain   PT Frequency 2x / week   PT Duration 8 weeks   PT Treatment/Interventions ADLs/Self Care Home Management;Aquatic Therapy;Moist Heat;Patient/family education;Neuromuscular re-education;Balance training;Therapeutic exercise;Therapeutic activities;Functional mobility training;Stair training;Gait training;Manual techniques;Passive range of motion;Energy conservation   PT Next Visit Plan Continue to progress LE and trunk stabiltiy while improving LE flexibility.      PT Home Exercise Plan updated with standing HR/TR, hamstring stretch, supine bridge, SLR, prone SLR   Consulted and Agree with Plan of Care Patient      Patient will benefit from skilled therapeutic intervention in order to improve the following deficits and impairments:  Abnormal gait, Decreased activity tolerance, Decreased balance, Decreased endurance, Decreased safety awareness, Decreased range of motion, Decreased mobility, Decreased strength, Difficulty walking, Hypomobility, Impaired flexibility, Increased muscle spasms, Improper body mechanics, Postural dysfunction, Pain  Visit Diagnosis: Muscle weakness (generalized)  Unsteadiness on feet  Other abnormalities of gait and mobility  Other symptoms and signs involving the musculoskeletal system     Problem List Patient Active Problem List   Diagnosis Date Noted  . Hyperlipidemia 04/02/2015  . Essential hypertension, benign 04/02/2015  . Other specified hypothyroidism 04/02/2015  . History of colonic polyps   . Encounter for screening colonoscopy 05/07/2014  . Psoriasis 10/15/2013  . Hypoxemia 09/05/2013  . SVT (supraventricular tachycardia) ([place]) 06/06/2013  . COPD (chronic obstructive pulmonary disease) ([place]) 06/06/2013  . Cardiac pacemaker in situ  06/06/2013  . Pacemaker 05/22/2013   . Cirrhosis ([place]) 01/22/2013  . Dyspepsia 01/22/2013  . Anemia 01/22/2013  . Atrial fibrillation ([place]) 11/21/2012  . Atrial flutter ([place]) 11/21/2012  . Chest pain 11/20/2012  .  Acute on chronic diastolic heart failure, predominantly Right heart failure 01/09/2012  . Syncope 01/06/2012  . Sinus arrest, 5 second pause on event monitor 01/06/2012  . Type II diabetes mellitus, uncontrolled ([place]) 01/06/2012  . Obesity 01/06/2012  . Smoker 01/06/2012  . Obstructive sleep apnea 01/06/2012  . DJD, on disability secondary to back pain, surg X 3 01/06/2012  . ABDOMINAL PAIN, RIGHT UPPER QUADRANT 06/14/2009  . ABDOMINAL PAIN 05/14/2009  . Hepatic cirrhosis ([place]) 01/13/2009  . GERD 12/04/2008  . GASTRITIS, ACUTE 12/04/2008  . HOARSENESS 12/04/2008  . CHEST PAIN 12/04/2008  . Dysphagia, pharyngoesophageal phase 12/04/2008    Teena Irani, PTA/CLT [phone] 08/19/2015, 10:21 AM  [employer] [address] [place], [place], [address] Phone: [phone]   Fax:  [phone]  Name: Ralph [name] MRN: [id number] Date of Birth: 02/26/1951

## 2015-08-19 NOTE — Patient Instructions (Signed)
Extension    Lift leg up in the air and bring it back down.  Repeat with other leg.Repeat _10__ times. Do __2__ sessions per day.     Straight Leg Raise    Tighten stomach and slowly raise locked right leg _15___ inches from floor. Repeat __10__ times per set.  Do _2___ sessions per day.    Bridge [employer] small of back into mat, maintain pelvic tilt, roll up one vertebrae at a time. Focus on engaging posterior hip muscles. Hold for __2__ breaths. Repeat _10___ times.   Toe / Heel Raise    Gently rock back on heels and raise toes. Then rock forward on toes and raise heels.Repeat sequence _10___ times per session. Do __2__ sessions per day    Hamstring Stretch (Standing)    Standing, place one heel on step or bench. Use one or both hands on thigh for support. Keeping torso straight, lean forward slowly until a stretch is felt in back of same thigh. Hold _30_ seconds. Repeat with other leg.

## 2015-08-19 NOTE — Telephone Encounter (Signed)
Routing to the refill box. 

## 2015-08-19 NOTE — Addendum Note (Signed)
Addended by: Mahala Menghini on: 08/19/2015 01:26 PM   Modules accepted: Orders

## 2015-08-21 ENCOUNTER — Ambulatory Visit (HOSPITAL_COMMUNITY): Payer: Medicare HMO | Admitting: Physical Therapy

## 2015-08-21 DIAGNOSIS — M6281 Muscle weakness (generalized): Secondary | ICD-10-CM

## 2015-08-21 DIAGNOSIS — R2681 Unsteadiness on feet: Secondary | ICD-10-CM

## 2015-08-21 DIAGNOSIS — R29898 Other symptoms and signs involving the musculoskeletal system: Secondary | ICD-10-CM

## 2015-08-21 DIAGNOSIS — R2689 Other abnormalities of gait and mobility: Secondary | ICD-10-CM

## 2015-08-21 NOTE — Therapy (Signed)
[place], [place], [address] Phone: [phone]   Fax:  [phone]  Physical Therapy Treatment  Patient Details  Name: Ralph [name] MRN: [id number] Date of Birth: 24-Sep-1950 Referring Provider: Narda Amber, MD  Encounter Date: 08/21/2015      PT End of Session - 08/21/15 1037    Visit Number 4   Number of Visits 16   Date for PT Re-Evaluation 09/07/15   Authorization Type AETNA Medicare   Authorization Time Period 08/10/15 to 10/06/15   Authorization - Visit Number 4   Authorization - Number of Visits 16   PT Start Time 0952  Pt arrived late   PT Stop Time 1032   PT Time Calculation (min) 40 min   Activity Tolerance Patient tolerated treatment well   Behavior During Therapy [facility] for tasks assessed/performed      Past Medical History  Diagnosis Date  . Hypertension   . 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 Depressed   . Varicose vein     of esophagus  . CHF (congestive heart failure) ([place]) 01/06/2012  . Sinus pause 01/06/2012    5.2 seconds  . Anginal pain ([place])   . Sleep apnea     "don't wear mask" (01/06/2012)  . Emphysema   . Type II diabetes mellitus ([place])   . GERD (gastroesophageal reflux disease)   . H/O hiatal hernia   . Coughing up blood     "comes from my throat" (01/06/2012)  . Arthritis     "back; fingers" (01/06/2012)  . Chronic lower back pain   . Fatty liver disease, nonalcoholic   . CHB (complete heart block) ([place])   . Presence of permanent cardiac pacemaker 9/292013    [employer]  . Atrial flutter [facility])     s/p EPS +RF ablation of typical atrial flutter April 2015  . Pacemaker   . Stroke [facility])     pt states that he might have had a stroke not sure  . Pneumonia Aug 2016  . Hypothyroidism   . Headache   . DDD (degenerative disc disease), lumbar   . Cancer ([place])     skin cancer  . Difficult intubation     Eschmann stylet used in 2002 and 2007; "trouble waking up  afterwards" (01/06/2012)    Past Surgical History  Procedure Laterality Date  . Back surgery    . Spinal cord stimulator implant  2006  . Cholecystectomy  1993  . Nasal septum surgery  1992  . Tonsillectomy and adenoidectomy  1992  . Posterior fusion lumbar spine  1999    L4-5  . Lumbar disc surgery  1994; ~ 1995; ~ 1996  . Warthin's tumor excision  1990's    right  . Permanent pacemaker insertion  01/08/2012    CHB  . US echocardiography  12/28/2011    mild LVH,mild mitral annulara ca+,mild MR  . Nuclear stress test  10/19/2004    No ischemia  . Esophagogastroduodenoscopy  11/08/2004    [id number] esophageal erosions consistent with erosive reflux esophagitis/Areas of hemorrhage and nodularity of the fundal mucosa of uncertain significance, biopsied.  Small hiatal hernia, otherwise normal stomach  . Colonoscopy  11/08/2004    [id number] rectum, colon, TI.  Marland Kitchen Esophagogastroduodenoscopy  2010    Dr. Gala Romney: 3 columns Grade 1 varices, erosive esophagitis, HH, portal gastropathy, normal D1, D2  . Esophagogastroduodenoscopy (egd) with esophageal dilation N/A 02/14/2013  [id number] esophageal varices. Abnormal distal esophagus/status post biopsy after Maloney dilation. Portal gastropathy. Antral erosions-status post biopsy. path negative for H.pylori, benign path.  . Permanent pacemaker insertion N/A 01/09/2012    Procedure: PERMANENT PACEMAKER INSERTION;  Surgeon: Sanda Klein, MD;  Location: [facility] CATH LAB;  Service: Cardiovascular;  Laterality: N/A;  . Atrial flutter ablation N/A 07/10/2013    Procedure: ATRIAL FLUTTER ABLATION;  Surgeon: Evans Lance, MD;  Location[facility] CATH LAB;  Service: Cardiovascular;  Laterality: N/A;  . Colonoscopy N/A 05/28/2014    Dr. Gala Romney: Redundant colon. single colonic polyp removed as described above. Tubular adenoma  . Esophagogastroduodenoscopy N/A 05/28/2014    Dr. Gala Romney: MIld erosive reflux esophagitis. Grade 1 esophageal varices. Patent esophagus. No  dilation performed. Hiatal hernia.   . Esophageal dilation N/A 05/28/2014    Procedure: ESOPHAGEAL DILATION;  Surgeon: Daneil Dolin, MD;  [facility];  Service: Endoscopy;  Laterality: N/A;  . Spinal cord stimulator removal N/A 01/27/2015    Procedure: LUMBAR SPINAL CORD STIMULATOR REMOVAL;  Surgeon: Kristeen Miss, MD;  Location: [place] NEURO ORS;  Service: Neurosurgery;  Laterality: N/A;  LUMBAR SPINAL CORD STIMULATOR REMOVAL    There were no vitals filed for this visit.      Subjective Assessment - 08/21/15 0955    Subjective Pt states he has been doing his exercises every day. Notes he is about the same since his last session. Feels his last session was good and has no other complaints.   Pertinent History HTN, DMII, pacemaker, L4-L5 fusion   Patient Stated Goals get stronger and mobility.   Currently in Pain? Yes   Pain Score 7    Pain Location Back   Pain Orientation Mid   Pain Descriptors / Indicators Aching   Pain Type Chronic pain   Pain Onset More than a month ago   Pain Frequency Constant   Aggravating Factors  Nothing; at is all painful   Pain Relieving Factors Nothing other than strong meds                         [facility] Adult PT Treatment/Exercise - 08/21/15 0001    Exercises   Exercises Lumbar   Lumbar Exercises: Stretches   Piriformis Stretch Both;3 reps;30 seconds   Piriformis Stretch Limitations increased difficulty R>L   Lumbar Exercises: Supine   Ab Set 10 reps;5 seconds   Bent Knee Raise 5 reps   Bent Knee Raise Limitations unable to maintain bracing at this time, breath holding noted as well    Other Supine Lumbar Exercises ab set with bent knee fall out x20 reps   Lumbar Exercises: Prone   Other Prone Lumbar Exercises repeated extension on hands   Manual Therapy   Manual Therapy Joint mobilization   Manual therapy comments Performed separately from all other interventions    Joint Mobilization Prone extension with therapist over  pressure x20 reps                PT Education - 08/21/15 1036    Education provided Yes   Education Details Updated HEP   Person(s) Educated Patient   Methods Explanation;Demonstration;Handout   Comprehension Verbalized understanding;Returned demonstration          PT Short Term Goals - 08/19/15 1010    PT SHORT TERM GOAL #1   Title Pt will demo understanding and indep with updated HEP.   Time 2   Period Weeks   Status  On-going   PT SHORT TERM GOAL #2   Title Pt will correctly repeat and demo correct log rolling technique during sit to/from supine without cuing from therapist   Time 2   Period Weeks   Status On-going   PT SHORT TERM GOAL #3   Title Pt will demo improved pain report of no greater than 5/10 throughout the day to improve his participation in ADLs.   Time 4   Period Weeks   Status On-going           PT Long Term Goals - 08/19/15 1011    PT LONG TERM GOAL #1   Title Pt will demo improved LE strength to atleast 4/5 throughout BLE to improve his safety with sit to stand transitions.    Time 8   Period Weeks   Status On-going   PT LONG TERM GOAL #2   Title Pt will demo improved functional strength and endurance evident by an improvement in 5x sit to stand to less than 30 sec without use of UE   Baseline 64 sec   Time 8   Period Weeks   Status On-going   PT LONG TERM GOAL #3   Title Pt will demonstrate improved TUG time in less than 14 sec and without and AD to decrease his risk of falls in the community.    Time 8   Period Weeks   Status On-going   PT LONG TERM GOAL #4   Title Pt will demonstrate improved balance evident by a Berg Balance Score improvement of atleast 4 points to decrease his risk of falls and injury.   Time 8   Period Weeks   Status On-going   PT LONG TERM GOAL #5   Title Pt will report decraese in pain to no greater than 3/10 to improve his tolerance of activity and ADLs.    Time 8   Period Weeks   Status On-going                Plan - 08/21/15 1040    Clinical Impression Statement Today's session focused on therex addressing abdominal strength and activation of deep abdominal musculature. Also performed manual treatment of over pressure during repeated extension with noted pain improvement by the end of the session. Updated HEP with visit with pt returning demonstration of understanding. He also demonstrated good performance of log roll during the session. Will continue with current POC.   Rehab Potential Fair   Clinical Impairments Affecting Rehab Potential chronicity of back pain   PT Frequency 2x / week   PT Duration 8 weeks   PT Treatment/Interventions ADLs/Self Care Home Management;Aquatic Therapy;Moist Heat;Patient/family education;Neuromuscular re-education;Balance training;Therapeutic exercise;Therapeutic activities;Functional mobility training;Stair training;Gait training;Manual techniques;Passive range of motion;Energy conservation   PT Next Visit Plan Continue to progress LE and trunk stabiltiy while improving LE flexibility.      PT Home Exercise Plan updated with standing HR/TR, hamstring stretch, supine bridge, prone SLR; updated 08/21/15 with ab set, ab set with bent knee fall out and piriformis stretch   Consulted and Agree with Plan of Care Patient      Patient will benefit from skilled therapeutic intervention in order to improve the following deficits and impairments:  Abnormal gait, Decreased activity tolerance, Decreased balance, Decreased endurance, Decreased safety awareness, Decreased range of motion, Decreased mobility, Decreased strength, Difficulty walking, Hypomobility, Impaired flexibility, Increased muscle spasms, Improper body mechanics, Postural dysfunction, Pain  Visit Diagnosis: Muscle weakness (generalized)  Unsteadiness on feet  Other abnormalities  of gait and mobility  Other symptoms and signs involving the musculoskeletal system     Problem List Patient  Active Problem List   Diagnosis Date Noted  . Hyperlipidemia 04/02/2015  . Essential hypertension, benign 04/02/2015  . Other specified hypothyroidism 04/02/2015  . History of colonic polyps   . Encounter for screening colonoscopy 05/07/2014  . Psoriasis 10/15/2013  . Hypoxemia 09/05/2013  . SVT (supraventricular tachycardia) ([place]) 06/06/2013  . COPD (chronic obstructive pulmonary disease) ([place]) 06/06/2013  . Cardiac pacemaker in situ 06/06/2013  . Pacemaker 05/22/2013  . Cirrhosis ([place]) 01/22/2013  . Dyspepsia 01/22/2013  . Anemia 01/22/2013  . Atrial fibrillation ([place]) 11/21/2012  . Atrial flutter ([place]) 11/21/2012  . Chest pain 11/20/2012  .  Acute on chronic diastolic heart failure, predominantly Right heart failure 01/09/2012  . Syncope 01/06/2012  . Sinus arrest, 5 second pause on event monitor 01/06/2012  . Type II diabetes mellitus, uncontrolled ([place]) 01/06/2012  . Obesity 01/06/2012  . Smoker 01/06/2012  . Obstructive sleep apnea 01/06/2012  . DJD, on disability secondary to back pain, surg X 3 01/06/2012  . ABDOMINAL PAIN, RIGHT UPPER QUADRANT 06/14/2009  . ABDOMINAL PAIN 05/14/2009  . Hepatic cirrhosis ([place]) 01/13/2009  . GERD 12/04/2008  . GASTRITIS, ACUTE 12/04/2008  . HOARSENESS 12/04/2008  . CHEST PAIN 12/04/2008  . Dysphagia, pharyngoesophageal phase 12/04/2008    10:47 AM,08/21/2015 Elly Modena PT, DPT [facility] [address] [place], [place], [address] Phone: [phone]   Fax:  [phone]  Name: Ralph [name] MRN: [id number] Date of Birth: 1950/07/04

## 2015-08-23 DIAGNOSIS — I4891 Unspecified atrial fibrillation: Secondary | ICD-10-CM | POA: Diagnosis not present

## 2015-08-23 DIAGNOSIS — I5030 Unspecified diastolic (congestive) heart failure: Secondary | ICD-10-CM | POA: Diagnosis not present

## 2015-08-23 DIAGNOSIS — J9610 Chronic respiratory failure, unspecified whether with hypoxia or hypercapnia: Secondary | ICD-10-CM | POA: Diagnosis not present

## 2015-08-23 DIAGNOSIS — J449 Chronic obstructive pulmonary disease, unspecified: Secondary | ICD-10-CM | POA: Diagnosis not present

## 2015-08-25 ENCOUNTER — Ambulatory Visit (HOSPITAL_COMMUNITY): Payer: Medicare HMO | Admitting: Physical Therapy

## 2015-08-25 DIAGNOSIS — M6281 Muscle weakness (generalized): Secondary | ICD-10-CM | POA: Diagnosis not present

## 2015-08-25 DIAGNOSIS — M545 Low back pain: Secondary | ICD-10-CM

## 2015-08-25 DIAGNOSIS — R2681 Unsteadiness on feet: Secondary | ICD-10-CM

## 2015-08-25 DIAGNOSIS — R29898 Other symptoms and signs involving the musculoskeletal system: Secondary | ICD-10-CM

## 2015-08-25 DIAGNOSIS — R2689 Other abnormalities of gait and mobility: Secondary | ICD-10-CM

## 2015-08-25 NOTE — Therapy (Signed)
[place], [place], [address] Phone: [phone]   Fax:  [phone]  Physical Therapy Treatment  Patient Details  Name: Ralph [name] MRN: [id number] Date of Birth: Mar 30, 1951 Referring Provider: Narda Amber, MD  Encounter Date: 08/25/2015      PT End of Session - 08/25/15 1002    Visit Number 5   Number of Visits 16   Date for PT Re-Evaluation 09/07/15   Authorization Type AETNA Medicare   Authorization Time Period 08/10/15 to 10/06/15   Authorization - Visit Number 5   Authorization - Number of Visits 16   PT Start Time 0950   PT Stop Time 1033   PT Time Calculation (min) 43 min   Activity Tolerance Patient tolerated treatment well   Behavior During Therapy [facility] for tasks assessed/performed      Past Medical History  Diagnosis Date  . Hypertension   . 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 Depressed   . Varicose vein     of esophagus  . CHF (congestive heart failure) ([place]) 01/06/2012  . Sinus pause 01/06/2012    5.2 seconds  . Anginal pain ([place])   . Sleep apnea     "don't wear mask" (01/06/2012)  . Emphysema   . Type II diabetes mellitus ([place])   . GERD (gastroesophageal reflux disease)   . H/O hiatal hernia   . Coughing up blood     "comes from my throat" (01/06/2012)  . Arthritis     "back; fingers" (01/06/2012)  . Chronic lower back pain   . Fatty liver disease, nonalcoholic   . CHB (complete heart block) ([place])   . Presence of permanent cardiac pacemaker 9/292013    [employer]  . Atrial flutter [facility])     s/p EPS +RF ablation of typical atrial flutter April 2015  . Pacemaker   . Stroke [facility])     pt states that he might have had a stroke not sure  . Pneumonia Aug 2016  . Hypothyroidism   . Headache   . DDD (degenerative disc disease), lumbar   . Cancer ([place])     skin cancer  . Difficult intubation     Eschmann stylet used in 2002 and 2007; "trouble waking up afterwards" (01/06/2012)     Past Surgical History  Procedure Laterality Date  . Back surgery    . Spinal cord stimulator implant  2006  . Cholecystectomy  1993  . Nasal septum surgery  1992  . Tonsillectomy and adenoidectomy  1992  . Posterior fusion lumbar spine  1999    L4-5  . Lumbar disc surgery  1994; ~ 1995; ~ 1996  . Warthin's tumor excision  1990's    right  . Permanent pacemaker insertion  01/08/2012    CHB  . US echocardiography  12/28/2011    mild LVH,mild mitral annulara ca+,mild MR  . Nuclear stress test  10/19/2004    No ischemia  . Esophagogastroduodenoscopy  11/08/2004    [id number] esophageal erosions consistent with erosive reflux esophagitis/Areas of hemorrhage and nodularity of the fundal mucosa of uncertain significance, biopsied.  Small hiatal hernia, otherwise normal stomach  . Colonoscopy  11/08/2004    [id number] rectum, colon, TI.  Marland Kitchen Esophagogastroduodenoscopy  2010    Dr. Gala Romney: 3 columns Grade 1 varices, erosive esophagitis, HH, portal gastropathy, normal D1, D2  . Esophagogastroduodenoscopy (egd) with esophageal dilation N/A 02/14/2013    [id number] esophageal  varices. Abnormal distal esophagus/status post biopsy after Maloney dilation. Portal gastropathy. Antral erosions-status post biopsy. path negative for H.pylori, benign path.  . Permanent pacemaker insertion N/A 01/09/2012    Procedure: PERMANENT PACEMAKER INSERTION;  Surgeon: Sanda Klein, MD;  Location: [facility] CATH LAB;  Service: Cardiovascular;  Laterality: N/A;  . Atrial flutter ablation N/A 07/10/2013    Procedure: ATRIAL FLUTTER ABLATION;  Surgeon: Evans Lance, MD;  Location: [facility] CATH LAB;  Service: Cardiovascular;  Laterality: N/A;  . Colonoscopy N/A 05/28/2014    Dr. Gala Romney: Redundant colon. single colonic polyp removed as described above. Tubular adenoma  . Esophagogastroduodenoscopy N/A 05/28/2014    Dr. Gala Romney: MIld erosive reflux esophagitis. Grade 1 esophageal varices. Patent esophagus. No dilation performed. Hiatal  hernia.   . Esophageal dilation N/A 05/28/2014    Procedure: ESOPHAGEAL DILATION;  Surgeon: Daneil Dolin, MD;  [facility];  Service: Endoscopy;  Laterality: N/A;  . Spinal cord stimulator removal N/A 01/27/2015    Procedure: LUMBAR SPINAL CORD STIMULATOR REMOVAL;  Surgeon: Kristeen Miss, MD;  Location: [place] NEURO ORS;  Service: Neurosurgery;  Laterality: N/A;  LUMBAR SPINAL CORD STIMULATOR REMOVAL    There were no vitals filed for this visit.      Subjective Assessment - 08/25/15 0953    Subjective PT states he is having a little less pain in his back today, however he began hurting in his anterior shoulder to elbow crease X 2 days ago.  Unsure why but going to MD about this today stating it is 9/10.   Currently in Pain? Yes   Pain Score 6    Pain Location Back   Pain Orientation Mid   Pain Descriptors / Indicators Aching   Pain Type Chronic pain   Pain Radiating Towards knees to feet also hurting                         [facility] Adult PT Treatment/Exercise - 08/25/15 0001    Knee/Hip Exercises: Stretches   Active Hamstring Stretch Both;3 reps;30 seconds   Active Hamstring Stretch Limitations standing 12"   Knee/Hip Exercises: Standing   Heel Raises Both;10 reps   Heel Raises Limitations toeraises 10 reps   Forward Lunges Both;10 reps   Forward Lunges Limitations 6" steps slight UE use   Hip Abduction Both;10 reps   Hip Extension Both;10 reps   Functional Squat 10 reps   Manual Therapy   Manual Therapy Joint mobilization   Manual therapy comments Performed separately from all other interventions    Joint Mobilization Prone extension with therapist over pressure x20 reps             Balance Exercises - 08/25/15 1006    Balance Exercises: Standing   Tandem Stance Eyes open;1 rep;30 secs;Limitations  no UE support   SLS Eyes open;3 reps;Time  max of 12" Rt, 8" Lt   Tandem Gait 1 rep   Retro Gait 1 rep   Sidestepping 1 rep              PT Short Term Goals - 08/25/15 1002    PT SHORT TERM GOAL #1   Title Pt will demo understanding and indep with updated HEP.   Time 2   Period Weeks   Status Achieved   PT SHORT TERM GOAL #2   Title Pt will correctly repeat and demo correct log rolling technique during sit to/from supine without cuing from therapist   Time 2  Period Weeks   Status On-going   PT SHORT TERM GOAL #3   Title Pt will demo improved pain report of no greater than 5/10 throughout the day to improve his participation in ADLs.   Time 4   Period Weeks   Status On-going           PT Long Term Goals - 08/25/15 1002    PT LONG TERM GOAL #1   Title Pt will demo improved LE strength to atleast 4/5 throughout BLE to improve his safety with sit to stand transitions.    Time 8   Period Weeks   Status On-going   PT LONG TERM GOAL #2   Title Pt will demo improved functional strength and endurance evident by an improvement in 5x sit to stand to less than 30 sec without use of UE   Baseline 64 sec   Time 8   Period Weeks   Status On-going   PT LONG TERM GOAL #3   Title Pt will demonstrate improved TUG time in less than 14 sec and without and AD to decrease his risk of falls in the community.    Time 8   Period Weeks   Status On-going   PT LONG TERM GOAL #4   Title Pt will demonstrate improved balance evident by a Berg Balance Score improvement of atleast 4 points to decrease his risk of falls and injury.   Time 8   Period Weeks   Status On-going   PT LONG TERM GOAL #5   Title Pt will report decrease in pain to no greater than 3/10 to improve his tolerance of activity and ADLs.    Time 8   Period Weeks   Status On-going               Plan - 08/25/15 1038    Clinical Impression Statement Focused session today on improving LE strength and stabiltiy.  Pt with overall reduction in pain in lumbar region, however hurting in Lt anterior shoulder (possibly due to addition of pressups).  Pt reports the  repeated prone extension wtih over pressure is helping reduce his symptoms.  Progress with static and dynamic balance actvities and additional standing strengthening exercises.  Pt with instability, mainly with tandem gait and difficulty balancing greater than 12" without UE assist.  Lt is notable weaker than Rt LE.     Rehab Potential Fair   Clinical Impairments Affecting Rehab Potential chronicity of back pain   PT Frequency 2x / week   PT Duration 8 weeks   PT Treatment/Interventions ADLs/Self Care Home Management;Aquatic Therapy;Moist Heat;Patient/family education;Neuromuscular re-education;Balance training;Therapeutic exercise;Therapeutic activities;Functional mobility training;Stair training;Gait training;Manual techniques;Passive range of motion;Energy conservation   PT Next Visit Plan Continue to progress LE and trunk stabiltiy while improving LE flexibility.   Add theraband to side stepping actvitiy next session.  Begin vector stance.    Consulted and Agree with Plan of Care Patient      Patient will benefit from skilled therapeutic intervention in order to improve the following deficits and impairments:  Abnormal gait, Decreased activity tolerance, Decreased balance, Decreased endurance, Decreased safety awareness, Decreased range of motion, Decreased mobility, Decreased strength, Difficulty walking, Hypomobility, Impaired flexibility, Increased muscle spasms, Improper body mechanics, Postural dysfunction, Pain  Visit Diagnosis: Muscle weakness (generalized)  Unsteadiness on feet  Other abnormalities of gait and mobility  Midline low back pain, with sciatica presence unspecified  Other symptoms and signs involving the musculoskeletal system     Problem List  Patient Active Problem List   Diagnosis Date Noted  . Hyperlipidemia 04/02/2015  . Essential hypertension, benign 04/02/2015  . Other specified hypothyroidism 04/02/2015  . History of colonic polyps   . Encounter for  screening colonoscopy 05/07/2014  . Psoriasis 10/15/2013  . Hypoxemia 09/05/2013  . SVT (supraventricular tachycardia) ([place]) 06/06/2013  . COPD (chronic obstructive pulmonary disease) ([place]) 06/06/2013  . Cardiac pacemaker in situ 06/06/2013  . Pacemaker 05/22/2013  . Cirrhosis ([place]) 01/22/2013  . Dyspepsia 01/22/2013  . Anemia 01/22/2013  . Atrial fibrillation ([place]) 11/21/2012  . Atrial flutter ([place]) 11/21/2012  . Chest pain 11/20/2012  .  Acute on chronic diastolic heart failure, predominantly Right heart failure 01/09/2012  . Syncope 01/06/2012  . Sinus arrest, 5 second pause on event monitor 01/06/2012  . Type II diabetes mellitus, uncontrolled ([place]) 01/06/2012  . Obesity 01/06/2012  . Smoker 01/06/2012  . Obstructive sleep apnea 01/06/2012  . DJD, on disability secondary to back pain, surg X 3 01/06/2012  . ABDOMINAL PAIN, RIGHT UPPER QUADRANT 06/14/2009  . ABDOMINAL PAIN 05/14/2009  . Hepatic cirrhosis ([place]) 01/13/2009  . GERD 12/04/2008  . GASTRITIS, ACUTE 12/04/2008  . HOARSENESS 12/04/2008  . CHEST PAIN 12/04/2008  . Dysphagia, pharyngoesophageal phase 12/04/2008    Teena Irani, PTA/CLT [phone]  08/25/2015, 10:42 AM  [place] [address] [place], [place], [address] Phone: [phone]   Fax:  [phone]  Name: Ralph [name] MRN: [id number] Date of Birth: 1950/08/26

## 2015-08-27 ENCOUNTER — Other Ambulatory Visit: Payer: Self-pay

## 2015-08-27 ENCOUNTER — Ambulatory Visit (HOSPITAL_COMMUNITY): Payer: Medicare HMO

## 2015-08-27 DIAGNOSIS — R29898 Other symptoms and signs involving the musculoskeletal system: Secondary | ICD-10-CM

## 2015-08-27 DIAGNOSIS — M545 Low back pain: Secondary | ICD-10-CM

## 2015-08-27 DIAGNOSIS — M6281 Muscle weakness (generalized): Secondary | ICD-10-CM | POA: Diagnosis not present

## 2015-08-27 DIAGNOSIS — R2689 Other abnormalities of gait and mobility: Secondary | ICD-10-CM

## 2015-08-27 DIAGNOSIS — R2681 Unsteadiness on feet: Secondary | ICD-10-CM

## 2015-08-27 MED ORDER — FUROSEMIDE 20 MG PO TABS: 40.0000 mg | ORAL_TABLET | Freq: Every day | ORAL | Status: DC | PRN

## 2015-08-27 NOTE — Telephone Encounter (Signed)
Refill complete 

## 2015-08-27 NOTE — Therapy (Signed)
[place], [place], [address] Phone: [phone]   Fax:  [phone]  Physical Therapy Treatment  Patient Details  Name: Ralph [name] MRN: [id number] Date of Birth: [birth year] Referring Provider: Narda Amber, MD  Encounter Date: 08/27/2015      PT End of Session - 08/27/15 0956    Visit Number 6   Number of Visits 16   Date for PT Re-Evaluation 09/07/15   Authorization Type AETNA Medicare   Authorization Time Period 08/10/15 to 10/06/15   Authorization - Visit Number 6   Authorization - Number of Visits 16   PT Start Time 0948   PT Stop Time 1042   PT Time Calculation (min) 54 min   Activity Tolerance Patient tolerated treatment well   Behavior During Therapy [facility] for tasks assessed/performed      Past Medical History  Diagnosis Date  . Hypertension   . 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 Depressed   . Varicose vein     of esophagus  . CHF (congestive heart failure) ([place]) 01/06/2012  . Sinus pause 01/06/2012    5.2 seconds  . Anginal pain ([place])   . Sleep apnea     "don't wear mask" (01/06/2012)  . Emphysema   . Type II diabetes mellitus ([place])   . GERD (gastroesophageal reflux disease)   . H/O hiatal hernia   . Coughing up blood     "comes from my throat" (01/06/2012)  . Arthritis     "back; fingers" (01/06/2012)  . Chronic lower back pain   . Fatty liver disease, nonalcoholic   . CHB (complete heart block) ([place])   . Presence of permanent cardiac pacemaker 9/292013    [employer]  . Atrial flutter [facility])     s/p EPS +RF ablation of typical atrial flutter April 2015  . Pacemaker   . Stroke [facility])     pt states that he might have had a stroke not sure  . Pneumonia Aug 2016  . Hypothyroidism   . Headache   . DDD (degenerative disc disease), lumbar   . Cancer ([place])     skin cancer  . Difficult intubation     Eschmann stylet used in 2002 and 2007; "trouble waking up afterwards" (01/06/2012)     Past Surgical History  Procedure Laterality Date  . Back surgery    . Spinal cord stimulator implant  2006  . Cholecystectomy  1993  . Nasal septum surgery  1992  . Tonsillectomy and adenoidectomy  1992  . Posterior fusion lumbar spine  1999    L4-5  . Lumbar disc surgery  1994; ~ 1995; ~ 1996  . Warthin's tumor excision  1990's    right  . Permanent pacemaker insertion  01/08/2012    CHB  . US echocardiography  12/28/2011    mild LVH,mild mitral annulara ca+,mild MR  . Nuclear stress test  10/19/2004    No ischemia  . Esophagogastroduodenoscopy  11/08/2004    [id number] esophageal erosions consistent with erosive reflux esophagitis/Areas of hemorrhage and nodularity of the fundal mucosa of uncertain significance, biopsied.  Small hiatal hernia, otherwise normal stomach  . Colonoscopy  11/08/2004    [id number] rectum, colon, TI.  Marland Kitchen Esophagogastroduodenoscopy  2010    Dr. Gala Romney: 3 columns Grade 1 varices, erosive esophagitis, HH, portal gastropathy, normal D1, D2  . Esophagogastroduodenoscopy (egd) with esophageal dilation N/A 02/14/2013    [id number] esophageal  varices. Abnormal distal esophagus/status post biopsy after Maloney dilation. Portal gastropathy. Antral erosions-status post biopsy. path negative for H.pylori, benign path.  . Permanent pacemaker insertion N/A 01/09/2012    Procedure: PERMANENT PACEMAKER INSERTION;  Surgeon: Sanda Klein, MD;  Location: [place] CATH LAB;  Service: Cardiovascular;  Laterality: N/A;  . Atrial flutter ablation N/A 07/10/2013    Procedure: ATRIAL FLUTTER ABLATION;  Surgeon: Evans Lance, MD;  Location: [facility] CATH LAB;  Service: Cardiovascular;  Laterality: N/A;  . Colonoscopy N/A 05/28/2014    Dr. Gala Romney: Redundant colon. single colonic polyp removed as described above. Tubular adenoma  . Esophagogastroduodenoscopy N/A 05/28/2014    Dr. Gala Romney: MIld erosive reflux esophagitis. Grade 1 esophageal varices. Patent esophagus. No dilation performed. Hiatal  hernia.   . Esophageal dilation N/A 05/28/2014    Procedure: ESOPHAGEAL DILATION;  Surgeon: Daneil Dolin, MD;  [facility];  Service: Endoscopy;  Laterality: N/A;  . Spinal cord stimulator removal N/A 01/27/2015    Procedure: LUMBAR SPINAL CORD STIMULATOR REMOVAL;  Surgeon: Kristeen Miss, MD;  Location: [place] NEURO ORS;  Service: Neurosurgery;  Laterality: N/A;  LUMBAR SPINAL CORD STIMULATOR REMOVAL    There were no vitals filed for this visit.      Subjective Assessment - 08/27/15 0953    Subjective Pt stated he continues to be limited with Bil LE from knees to toes.  Reports compliance with HEP daily without questions   Pertinent History HTN, DMII, pacemaker, L4-L5 fusion   Patient Stated Goals get stronger and mobility.   Currently in Pain? Yes   Pain Score 6    Pain Location Back   Pain Orientation Lower   Pain Descriptors / Indicators Throbbing;Stabbing   Pain Type Chronic pain   Pain Radiating Towards knees to feet also hurting   Pain Onset More than a month ago   Pain Frequency Constant   Aggravating Factors  Nothing; at is all painful   Pain Relieving Factors Nothing other than strong meds   Effect of Pain on Daily Activities affecting balance/ADLs            [facility] PT Assessment - 08/27/15 0001    Assessment   Medical Diagnosis B LE weakness, Lumbosacral radiculopathy   Referring Provider Narda Amber, MD   Onset Date/Surgical Date --  several years ago   Hand Dominance Right   Next MD Visit none as of now   Prior Therapy No prior therapy    Precautions   Precautions None            [facility] Adult PT Treatment/Exercise - 08/27/15 0001    Knee/Hip Exercises: Stretches   Active Hamstring Stretch Both;3 reps;30 seconds   Active Hamstring Stretch Limitations standing 2nd step    Hip Flexor Stretch 3 reps;30 seconds   Hip Flexor Stretch Limitations standing 2nd step   Knee/Hip Exercises: Standing   Heel Raises Both;15 reps   Heel Raises Limitations  toeraises 15 reps   Forward Lunges Both;10 reps   Forward Lunges Limitations 6" steps slight UE use   Hip Abduction Both;15 reps   Hip Extension Both;15 reps   Functional Squat 15 reps   SLS with Vectors 3x5" Bil with intermittent UE A             Balance Exercises - 08/27/15 1204    Balance Exercises: Standing   Tandem Gait 1 rep   Retro Gait 1 rep   Sidestepping 1 rep;Theraband  RTB  PT Short Term Goals - 08/25/15 1002    PT SHORT TERM GOAL #1   Title Pt will demo understanding and indep with updated HEP.   Time 2   Period Weeks   Status Achieved   PT SHORT TERM GOAL #2   Title Pt will correctly repeat and demo correct log rolling technique during sit to/from supine without cuing from therapist   Time 2   Period Weeks   Status On-going   PT SHORT TERM GOAL #3   Title Pt will demo improved pain report of no greater than 5/10 throughout the day to improve his participation in ADLs.   Time 4   Period Weeks   Status On-going           PT Long Term Goals - 08/25/15 1002    PT LONG TERM GOAL #1   Title Pt will demo improved LE strength to atleast 4/5 throughout BLE to improve his safety with sit to stand transitions.    Time 8   Period Weeks   Status On-going   PT LONG TERM GOAL #2   Title Pt will demo improved functional strength and endurance evident by an improvement in 5x sit to stand to less than 30 sec without use of UE   Baseline 64 sec   Time 8   Period Weeks   Status On-going   PT LONG TERM GOAL #3   Title Pt will demonstrate improved TUG time in less than 14 sec and without and AD to decrease his risk of falls in the community.    Time 8   Period Weeks   Status On-going   PT LONG TERM GOAL #4   Title Pt will demonstrate improved balance evident by a Berg Balance Score improvement of atleast 4 points to decrease his risk of falls and injury.   Time 8   Period Weeks   Status On-going   PT LONG TERM GOAL #5   Title Pt will report  decrease in pain to no greater than 3/10 to improve his tolerance of activity and ADLs.    Time 8   Period Weeks   Status On-going               Plan - 08/27/15 1025    Clinical Impression Statement Session focus on improving LE strengthening and stability to improve balance.  Pt limited by pain through session.  Noted limited mobility Bil LE, added hip stretches to improve mobility to assist with pain control.  Began vector stance and added theraband resistance with sidestepping to improve hip stability and balance.  Min A required with balance activities to reduce risk of fall with multiple LOB episodes with NBOS activities.  End of session no reports of increased pain.   Rehab Potential Fair   Clinical Impairments Affecting Rehab Potential chronicity of back pain   PT Frequency 2x / week   PT Duration 8 weeks   PT Treatment/Interventions ADLs/Self Care Home Management;Aquatic Therapy;Moist Heat;Patient/family education;Neuromuscular re-education;Balance training;Therapeutic exercise;Therapeutic activities;Functional mobility training;Stair training;Gait training;Manual techniques;Passive range of motion;Energy conservation   PT Next Visit Plan Continue to progress LE and trunk stabiltiy while improving LE flexibility.   Continue stabiltiy and LE strengthening exercises per PT POC.        Patient will benefit from skilled therapeutic intervention in order to improve the following deficits and impairments:  Abnormal gait, Decreased activity tolerance, Decreased balance, Decreased endurance, Decreased safety awareness, Decreased range of motion, Decreased mobility, Decreased strength, Difficulty  walking, Hypomobility, Impaired flexibility, Increased muscle spasms, Improper body mechanics, Postural dysfunction, Pain  Visit Diagnosis: Muscle weakness (generalized)  Unsteadiness on feet  Other abnormalities of gait and mobility  Midline low back pain, with sciatica presence  unspecified  Other symptoms and signs involving the musculoskeletal system     Problem List Patient Active Problem List   Diagnosis Date Noted  . Hyperlipidemia 04/02/2015  . Essential hypertension, benign 04/02/2015  . Other specified hypothyroidism 04/02/2015  . History of colonic polyps   . Encounter for screening colonoscopy 05/07/2014  . Psoriasis 10/15/2013  . Hypoxemia 09/05/2013  . SVT (supraventricular tachycardia) ([place]) 06/06/2013  . COPD (chronic obstructive pulmonary disease) ([place]) 06/06/2013  . Cardiac pacemaker in situ 06/06/2013  . Pacemaker 05/22/2013  . Cirrhosis ([place]) 01/22/2013  . Dyspepsia 01/22/2013  . Anemia 01/22/2013  . Atrial fibrillation ([place]) 11/21/2012  . Atrial flutter ([place]) 11/21/2012  . Chest pain 11/20/2012  .  Acute on chronic diastolic heart failure, predominantly Right heart failure 01/09/2012  . Syncope 01/06/2012  . Sinus arrest, 5 second pause on event monitor 01/06/2012  . Type II diabetes mellitus, uncontrolled ([place]) 01/06/2012  . Obesity 01/06/2012  . Smoker 01/06/2012  . Obstructive sleep apnea 01/06/2012  . DJD, on disability secondary to back pain, surg X 3 01/06/2012  . ABDOMINAL PAIN, RIGHT UPPER QUADRANT 06/14/2009  . ABDOMINAL PAIN 05/14/2009  . Hepatic cirrhosis ([place]) 01/13/2009  . GERD 12/04/2008  . GASTRITIS, ACUTE 12/04/2008  . HOARSENESS 12/04/2008  . CHEST PAIN 12/04/2008  . Dysphagia, pharyngoesophageal phase 12/04/2008   Ihor Austin, LPTA; [place]  Aldona Lento 08/27/2015, 12:04 PM  [place], [place], [address] Phone: [phone]   Fax:  [phone]  Name: Ralph [name] MRN: [id number] Date of Birth: 1950/05/01

## 2015-08-29 DIAGNOSIS — R69 Illness, unspecified: Secondary | ICD-10-CM | POA: Diagnosis not present

## 2015-09-01 ENCOUNTER — Ambulatory Visit (HOSPITAL_COMMUNITY): Payer: Medicare HMO | Admitting: Physical Therapy

## 2015-09-01 DIAGNOSIS — M6281 Muscle weakness (generalized): Secondary | ICD-10-CM | POA: Diagnosis not present

## 2015-09-01 DIAGNOSIS — R2689 Other abnormalities of gait and mobility: Secondary | ICD-10-CM

## 2015-09-01 DIAGNOSIS — R2681 Unsteadiness on feet: Secondary | ICD-10-CM

## 2015-09-01 DIAGNOSIS — M545 Low back pain: Secondary | ICD-10-CM

## 2015-09-01 NOTE — Therapy (Signed)
[place], [place], [address] Phone: [phone]   Fax:  [phone]  Physical Therapy Treatment  Patient Details  Name: Ralph [name] MRN: [id number] Date of Birth: Jan 18, 1951 Referring Provider: Narda Amber, MD  Encounter Date: 09/01/2015      PT End of Session - 09/01/15 1043    Visit Number 7   Number of Visits 16   Date for PT Re-Evaluation 09/07/15   Authorization Type AETNA Medicare   Authorization Time Period 08/10/15 to 10/06/15   Authorization - Visit Number 7   Authorization - Number of Visits 16   PT Start Time 0950   PT Stop Time 1040   PT Time Calculation (min) 50 min   Activity Tolerance Patient tolerated treatment well   Behavior During Therapy [facility] for tasks assessed/performed      Past Medical History  Diagnosis Date  . Hypertension   . 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 Depressed   . Varicose vein     of esophagus  . CHF (congestive heart failure) ([place]) 01/06/2012  . Sinus pause 01/06/2012    5.2 seconds  . Anginal pain ([place])   . Sleep apnea     "don't wear mask" (01/06/2012)  . Emphysema   . Type II diabetes mellitus ([place])   . GERD (gastroesophageal reflux disease)   . H/O hiatal hernia   . Coughing up blood     "comes from my throat" (01/06/2012)  . Arthritis     "back; fingers" (01/06/2012)  . Chronic lower back pain   . Fatty liver disease, nonalcoholic   . CHB (complete heart block) ([name])   . Presence of permanent cardiac pacemaker 9/292013    [employer]  . Atrial flutter [facility])     s/p EPS +RF ablation of typical atrial flutter April 2015  . Pacemaker   . Stroke [facility])     pt states that he might have had a stroke not sure  . Pneumonia Aug 2016  . Hypothyroidism   . Headache   . DDD (degenerative disc disease), lumbar   . Cancer ([place])     skin cancer  . Difficult intubation     Eschmann stylet used in 2002 and 2007; "trouble waking up afterwards" (01/06/2012)     Past Surgical History  Procedure Laterality Date  . Back surgery    . Spinal cord stimulator implant  2006  . Cholecystectomy  1993  . Nasal septum surgery  1992  . Tonsillectomy and adenoidectomy  1992  . Posterior fusion lumbar spine  1999    L4-5  . Lumbar disc surgery  1994; ~ 1995; ~ 1996  . Warthin's tumor excision  1990's    right  . Permanent pacemaker insertion  01/08/2012    CHB  . US echocardiography  12/28/2011    mild LVH,mild mitral annulara ca+,mild MR  . Nuclear stress test  10/19/2004    No ischemia  . Esophagogastroduodenoscopy  11/08/2004    [id number] esophageal erosions consistent with erosive reflux esophagitis/Areas of hemorrhage and nodularity of the fundal mucosa of uncertain significance, biopsied.  Small hiatal hernia, otherwise normal stomach  . Colonoscopy  11/08/2004    [id number] rectum, colon, TI.  Marland Kitchen Esophagogastroduodenoscopy  2010    Dr. Gala Romney: 3 columns Grade 1 varices, erosive esophagitis, HH, portal gastropathy, normal D1, D2  . Esophagogastroduodenoscopy (egd) with esophageal dilation N/A 02/14/2013    [id number] esophageal  varices. Abnormal distal esophagus/status post biopsy after Maloney dilation. Portal gastropathy. Antral erosions-status post biopsy. path negative for H.pylori, benign path.  . Permanent pacemaker insertion N/A 01/09/2012    Procedure: PERMANENT PACEMAKER INSERTION;  Surgeon: Sanda Klein, MD;  Location: [place] CATH LAB;  Service: Cardiovascular;  Laterality: N/A;  . Atrial flutter ablation N/A 07/10/2013    Procedure: ATRIAL FLUTTER ABLATION;  Surgeon: Evans Lance, MD;  Location: [facility] CATH LAB;  Service: Cardiovascular;  Laterality: N/A;  . Colonoscopy N/A 05/28/2014    Dr. Gala Romney: Redundant colon. single colonic polyp removed as described above. Tubular adenoma  . Esophagogastroduodenoscopy N/A 05/28/2014    Dr. Gala Romney: MIld erosive reflux esophagitis. Grade 1 esophageal varices. Patent esophagus. No dilation performed. Hiatal  hernia.   . Esophageal dilation N/A 05/28/2014    Procedure: ESOPHAGEAL DILATION;  Surgeon: Daneil Dolin, MD;  [facility];  Service: Endoscopy;  Laterality: N/A;  . Spinal cord stimulator removal N/A 01/27/2015    Procedure: LUMBAR SPINAL CORD STIMULATOR REMOVAL;  Surgeon: Kristeen Miss, MD;  Location: [place] NEURO ORS;  Service: Neurosurgery;  Laterality: N/A;  LUMBAR SPINAL CORD STIMULATOR REMOVAL    There were no vitals filed for this visit.      Subjective Assessment - 09/01/15 0957    Subjective Pt states he is having increased pain.  No known reason.  currently 9/10 in low back region.   Currently in Pain? Yes   Pain Score 9    Pain Location Back   Pain Orientation Lower   Pain Descriptors / Indicators Throbbing;Stabbing   Pain Type Chronic pain                         [facility] Adult PT Treatment/Exercise - 09/01/15 0001    Knee/Hip Exercises: Stretches   Active Hamstring Stretch Both;3 reps;30 seconds   Active Hamstring Stretch Limitations standing 2nd step    Hip Flexor Stretch 3 reps;30 seconds   Hip Flexor Stretch Limitations standing 2nd step   Gastroc Stretch Both;3 reps;30 seconds   Gastroc Stretch Limitations slant board   Knee/Hip Exercises: Aerobic   Nustep 6 minutes UE/LE hills #2 level 2   Knee/Hip Exercises: Standing   Heel Raises Both;15 reps   Heel Raises Limitations toeraises 15 reps   Forward Lunges Both;10 reps   Forward Lunges Limitations 4" steps slight UE use   Hip Abduction Both;15 reps   Hip Extension Both;15 reps   Functional Squat 15 reps   SLS with Vectors 3x5" Bil with intermittent UE A                  PT Short Term Goals - 08/25/15 1002    PT SHORT TERM GOAL #1   Title Pt will demo understanding and indep with updated HEP.   Time 2   Period Weeks   Status Achieved   PT SHORT TERM GOAL #2   Title Pt will correctly repeat and demo correct log rolling technique during sit to/from supine without cuing  from therapist   Time 2   Period Weeks   Status On-going   PT SHORT TERM GOAL #3   Title Pt will demo improved pain report of no greater than 5/10 throughout the day to improve his participation in ADLs.   Time 4   Period Weeks   Status On-going           PT Long Term Goals - 08/25/15 1002  PT LONG TERM GOAL #1   Title Pt will demo improved LE strength to atleast 4/5 throughout BLE to improve his safety with sit to stand transitions.    Time 8   Period Weeks   Status On-going   PT LONG TERM GOAL #2   Title Pt will demo improved functional strength and endurance evident by an improvement in 5x sit to stand to less than 30 sec without use of UE   Baseline 64 sec   Time 8   Period Weeks   Status On-going   PT LONG TERM GOAL #3   Title Pt will demonstrate improved TUG time in less than 14 sec and without and AD to decrease his risk of falls in the community.    Time 8   Period Weeks   Status On-going   PT LONG TERM GOAL #4   Title Pt will demonstrate improved balance evident by a Berg Balance Score improvement of atleast 4 points to decrease his risk of falls and injury.   Time 8   Period Weeks   Status On-going   PT LONG TERM GOAL #5   Title Pt will report decrease in pain to no greater than 3/10 to improve his tolerance of activity and ADLs.    Time 8   Period Weeks   Status On-going               Plan - 09/01/15 1044    Clinical Impression Statement Continued focus on improving LE strength, stability and balance.  No complaints of pain or pain behaviors noted throughout session.  Pt with rigid, slow posturing and gait.  Requires therapist facilitation for correct form with therex.     Rehab Potential Fair   Clinical Impairments Affecting Rehab Potential chronicity of back pain   PT Frequency 2x / week   PT Duration 8 weeks   PT Treatment/Interventions ADLs/Self Care Home Management;Aquatic Therapy;Moist Heat;Patient/family education;Neuromuscular  re-education;Balance training;Therapeutic exercise;Therapeutic activities;Functional mobility training;Stair training;Gait training;Manual techniques;Passive range of motion;Energy conservation   PT Next Visit Plan Continue to progress LE and trunk stabiltiy while improving LE flexibility.   Continue stabiltiy and LE strengthening exercises per PT POC.        Patient will benefit from skilled therapeutic intervention in order to improve the following deficits and impairments:  Abnormal gait, Decreased activity tolerance, Decreased balance, Decreased endurance, Decreased safety awareness, Decreased range of motion, Decreased mobility, Decreased strength, Difficulty walking, Hypomobility, Impaired flexibility, Increased muscle spasms, Improper body mechanics, Postural dysfunction, Pain  Visit Diagnosis: Muscle weakness (generalized)  Unsteadiness on feet  Other abnormalities of gait and mobility  Midline low back pain, with sciatica presence unspecified     Problem List Patient Active Problem List   Diagnosis Date Noted  . Hyperlipidemia 04/02/2015  . Essential hypertension, benign 04/02/2015  . Other specified hypothyroidism 04/02/2015  . History of colonic polyps   . Encounter for screening colonoscopy 05/07/2014  . Psoriasis 10/15/2013  . Hypoxemia 09/05/2013  . SVT (supraventricular tachycardia) ([place]) 06/06/2013  . COPD (chronic obstructive pulmonary disease) ([place]) 06/06/2013  . Cardiac pacemaker in situ 06/06/2013  . Pacemaker 05/22/2013  . Cirrhosis ([place]) 01/22/2013  . Dyspepsia 01/22/2013  . Anemia 01/22/2013  . Atrial fibrillation ([place]) 11/21/2012  . Atrial flutter ([place]) 11/21/2012  . Chest pain 11/20/2012  .  Acute on chronic diastolic heart failure, predominantly Right heart failure 01/09/2012  . Syncope 01/06/2012  . Sinus arrest, 5 second pause on event monitor 01/06/2012  . Type  II diabetes mellitus, uncontrolled ([place]) 01/06/2012  . Obesity 01/06/2012  . Smoker  01/06/2012  . Obstructive sleep apnea 01/06/2012  . DJD, on disability secondary to back pain, surg X 3 01/06/2012  . ABDOMINAL PAIN, RIGHT UPPER QUADRANT 06/14/2009  . ABDOMINAL PAIN 05/14/2009  . Hepatic cirrhosis ([place]) 01/13/2009  . GERD 12/04/2008  . GASTRITIS, ACUTE 12/04/2008  . HOARSENESS 12/04/2008  . CHEST PAIN 12/04/2008  . Dysphagia, pharyngoesophageal phase 12/04/2008    Teena Irani, PTA/CLT [phone] 09/01/2015, 10:47 AM  [place] [address] [place], [place], [address] Phone: [phone]   Fax:  [phone]  Name: Ralph [name] MRN: [id number] Date of Birth: Feb 08, 1951

## 2015-09-02 ENCOUNTER — Telehealth (HOSPITAL_COMMUNITY): Payer: Self-pay

## 2015-09-02 NOTE — Telephone Encounter (Signed)
[id number] cx - has a conflicting appt with Rheumatologist

## 2015-09-03 ENCOUNTER — Encounter (HOSPITAL_COMMUNITY): Payer: Medicare HMO | Admitting: Physical Therapy

## 2015-09-03 DIAGNOSIS — M2550 Pain in unspecified joint: Secondary | ICD-10-CM | POA: Diagnosis not present

## 2015-09-03 DIAGNOSIS — M5136 Other intervertebral disc degeneration, lumbar region: Secondary | ICD-10-CM | POA: Diagnosis not present

## 2015-09-03 DIAGNOSIS — L4050 Arthropathic psoriasis, unspecified: Secondary | ICD-10-CM | POA: Diagnosis not present

## 2015-09-03 DIAGNOSIS — L409 Psoriasis, unspecified: Secondary | ICD-10-CM | POA: Diagnosis not present

## 2015-09-08 ENCOUNTER — Ambulatory Visit (HOSPITAL_COMMUNITY): Payer: Medicare HMO | Admitting: Physical Therapy

## 2015-09-08 ENCOUNTER — Encounter (HOSPITAL_COMMUNITY): Payer: Self-pay | Admitting: Physical Therapy

## 2015-09-08 DIAGNOSIS — M6281 Muscle weakness (generalized): Secondary | ICD-10-CM | POA: Diagnosis not present

## 2015-09-08 DIAGNOSIS — M545 Low back pain: Secondary | ICD-10-CM

## 2015-09-08 DIAGNOSIS — R2681 Unsteadiness on feet: Secondary | ICD-10-CM

## 2015-09-08 DIAGNOSIS — R29898 Other symptoms and signs involving the musculoskeletal system: Secondary | ICD-10-CM

## 2015-09-08 DIAGNOSIS — R2689 Other abnormalities of gait and mobility: Secondary | ICD-10-CM

## 2015-09-08 NOTE — Therapy (Signed)
[place], [place], [address] Phone: [phone]   Fax:  [phone]  Physical Therapy Treatment/Reassessment  Patient Details  Name: Ralph [name] MRN: [id number] Date of Birth: [birth year] Referring Provider: Narda Amber, MD  Encounter Date: 09/08/2015      PT End of Session - 09/08/15 1109    Visit Number 8   Number of Visits 16   Date for PT Re-Evaluation 10/06/15   Authorization Type [employer] Medicare   Authorization Time Period 08/10/15 to 10/06/15   Authorization - Visit Number 8   Authorization - Number of Visits 16   PT Start Time 0900   PT Stop Time 0945   PT Time Calculation (min) 45 min   Equipment Utilized During Treatment Gait belt   Activity Tolerance Patient tolerated treatment well   Behavior During Therapy [facility] for tasks assessed/performed      Past Medical History  Diagnosis Date  . Hypertension   . 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 Depressed   . Varicose vein     of esophagus  . CHF (congestive heart failure) ([place]) 01/06/2012  . Sinus pause 01/06/2012    5.2 seconds  . Anginal pain ([place])   . Sleep apnea     "don't wear mask" (01/06/2012)  . Emphysema   . Type II diabetes mellitus ([place])   . GERD (gastroesophageal reflux disease)   . H/O hiatal hernia   . Coughing up blood     "comes from my throat" (01/06/2012)  . Arthritis     "back; fingers" (01/06/2012)  . Chronic lower back pain   . Fatty liver disease, nonalcoholic   . CHB (complete heart block) ([place])   . Presence of permanent cardiac pacemaker 9/292013    [employer]  . Atrial flutter [facility])     s/p EPS +RF ablation of typical atrial flutter April 2015  . Pacemaker   . Stroke [facility])     pt states that he might have had a stroke not sure  . Pneumonia Aug 2016  . Hypothyroidism   . Headache   . DDD (degenerative disc disease), lumbar   . Cancer ([place])     skin cancer  . Difficult intubation     Eschmann stylet  used in 2002 and 2007; "trouble waking up afterwards" (01/06/2012)    Past Surgical History  Procedure Laterality Date  . Back surgery    . Spinal cord stimulator implant  2006  . Cholecystectomy  1993  . Nasal septum surgery  1992  . Tonsillectomy and adenoidectomy  1992  . Posterior fusion lumbar spine  1999    L4-5  . Lumbar disc surgery  1994; ~ 1995; ~ 1996  . Warthin's tumor excision  1990's    right  . Permanent pacemaker insertion  01/08/2012    CHB  . US echocardiography  12/28/2011    mild LVH,mild mitral annulara ca+,mild MR  . Nuclear stress test  10/19/2004    No ischemia  . Esophagogastroduodenoscopy  11/08/2004    ZPH:XTAVWP esophageal erosions consistent with erosive reflux esophagitis/Areas of hemorrhage and nodularity of the fundal mucosa of uncertain significance, biopsied.  Small hiatal hernia, otherwise normal stomach  . Colonoscopy  11/08/2004    VXY:IAXKPV rectum, colon, TI.  Marland Kitchen Esophagogastroduodenoscopy  2010    Dr. Gala Romney: 3 columns Grade 1 varices, erosive esophagitis, HH, portal gastropathy, normal D1, D2  . Esophagogastroduodenoscopy (egd) with esophageal dilation  N/A 02/14/2013    GRG:NSWZM 1 esophageal varices. Abnormal distal esophagus/status post biopsy after Maloney dilation. Portal gastropathy. Antral erosions-status post biopsy. path negative for H.pylori, benign path.  . Permanent pacemaker insertion N/A 01/09/2012    Procedure: PERMANENT PACEMAKER INSERTION;  Surgeon: Thurmon Fair, MD;  Location: [facility] CATH LAB;  Service: Cardiovascular;  Laterality: N/A;  . Atrial flutter ablation N/A 07/10/2013    Procedure: ATRIAL FLUTTER ABLATION;  Surgeon: Marinus Maw, MD;  Location: [facility] CATH LAB;  Service: Cardiovascular;  Laterality: N/A;  . Colonoscopy N/A 05/28/2014    Dr. Jena Gauss: Redundant colon. single colonic polyp removed as described above. Tubular adenoma  . Esophagogastroduodenoscopy N/A 05/28/2014    Dr. Jena Gauss: MIld erosive reflux esophagitis. Grade 1  esophageal varices. Patent esophagus. No dilation performed. Hiatal hernia.   . Esophageal dilation N/A 05/28/2014    Procedure: ESOPHAGEAL DILATION;  Surgeon: Corbin Ade, MD;  Location: [facility] ENDO SUITE;  Service: Endoscopy;  Laterality: N/A;  . Spinal cord stimulator removal N/A 01/27/2015    Procedure: LUMBAR SPINAL CORD STIMULATOR REMOVAL;  Surgeon: Barnett Abu, MD;  Location: [facility] NEURO ORS;  Service: Neurosurgery;  Laterality: N/A;  LUMBAR SPINAL CORD STIMULATOR REMOVAL    There were no vitals filed for this visit.      Subjective Assessment - 09/08/15 0903    Subjective Pt states he has been doing his exercises daily and the sides of his legs are constantly sore. He feels he is getting better overall. He is overall using his cane less than he was before because he doesn't feel quite as "wobbly".    Pertinent History HTN, DMII, pacemaker, L4-L5 fusion   Limitations Other (comment)  getting up from half kneel position due to weakness; back limits ADLs when at its worst.    How long can you sit comfortably? 2 hours    How long can you stand comfortably? prefers standing over sitting and unable for long periods of time because his legs get tired.   How long can you walk comfortably? depends: sometimes unlimited and other times he can walk to the car and his back starts to hurt so bad he can't move for a few seconds.   Patient Stated Goals get stronger and mobility.   Currently in Pain? Yes   Pain Score 5    Pain Location Hip  Back   Pain Orientation --  Rt hip and mid lower back   Pain Descriptors / Indicators Sharp;Aching   Pain Type Chronic pain   Pain Onset More than a month ago   Pain Frequency Constant   Aggravating Factors  bending over to pick up an object; twisting   Pain Relieving Factors Medication; extension; walking around after sitting for a while   Effect of Pain on Daily Activities affecting ADLs   Multiple Pain Sites No            [facility] PT Assessment -  09/08/15 0001    Assessment   Medical Diagnosis B LE weakness, Lumbosacral radiculopathy   Referring Provider Nita Sickle, MD   Onset Date/Surgical Date --  several years ago   Next MD Visit none as of now   Prior Therapy No prior therapy    Precautions   Precautions None   Balance Screen   Has the patient fallen in the past 6 months Yes   How many times? 3  some close calls recently   Has the patient had a decrease in activity level because of a  fear of falling?  No   Is the patient reluctant to leave their home because of a fear of falling?  No   Home Environment   Living Environment Private residence   Living Arrangements Spouse/significant other   Type of [place] entrance   Prior Function   Level of [place] Retired   Leisure go fishing   Cognition   Overall Cognitive Status Within Functional Limits for tasks assessed   Sensation   Light Touch Appears Intact   Posture/Postural Control   Posture/Postural Control Postural limitations   Postural Limitations Rounded Shoulders;Forward head;Decreased lumbar lordosis   Posture Comments Pt sitting slouched to the L   AROM   Lumbar Flexion 6 inches above toes, pain in low back    Lumbar Extension end range pain and increased when coming back to neutral    Lumbar - Right Side Bend 3 inches above jt line, pain end range   Lumbar - Left Side Bend jt line  end range pain   Lumbar - Right Rotation 50% limited, end range pain   Lumbar - Left Rotation 75% limited, end range pain    Strength   Right Hip Flexion 4-/5   Right Hip Extension 4+/5   Right Hip ABduction 5/5   Left Hip Flexion 4-/5   Left Hip Extension 4+/5   Left Hip ABduction 5/5   Right Knee Flexion 5/5   Right Knee Extension 5/5   Left Knee Flexion 5/5   Left Knee Extension 5/5   Right Ankle Dorsiflexion 5/5   Left Ankle Dorsiflexion 5/5   Flexibility   Soft Tissue Assessment /Muscle Length yes   Hamstrings  L/R: 35/30 deg  L into low back   Quadriceps (+) thomas for B hip flexor tightness   Piriformis 25% limited, pain reported with each    Palpation   Palpation comment unable to assess this session    Special Tests    Special Tests Lumbar   Lumbar Tests Straight Leg Raise   Straight Leg Raise   Findings Negative   Side  Left   Comment neural tension posterior knee and calf   Ambulation/Gait   Ambulation/Gait Yes   Standardized Balance Assessment   Standardized Balance Assessment Timed Up and Go Test;Berg Balance Test;Five Times Sit to Stand   Five times sit to stand comments  22 sec, no UE   Berg Balance Test   Sit to Stand Able to stand  independently using hands   Standing Unsupported Able to stand safely 2 minutes   Sitting with Back Unsupported but Feet Supported on Floor or Stool Able to sit safely and securely 2 minutes   Stand to Sit Controls descent by using hands   Transfers Able to transfer safely, minor use of hands   Standing Unsupported with Eyes Closed Able to stand 10 seconds safely  anterior lean   Standing Ubsupported with Feet Together Able to place feet together independently and stand 1 minute safely  postural sway noted   From Standing, Reach Forward with Outstretched Arm Can reach forward >12 cm safely (5")  6"   From Standing Position, Pick up Object from Floor Able to pick up shoe safely and easily   From Standing Position, Turn to Look Behind Over each Shoulder Looks behind from both sides and weight shifts well   Turn 360 Degrees Able to turn 360 degrees safely one side only in 4 seconds or less  minor LOB with correction turning Rt   Standing Unsupported, Alternately Place Feet on Step/Stool Able to stand independently and complete 8 steps >20 seconds   Standing Unsupported, One Foot in Front Able to plae foot ahead of the other independently and hold 30 seconds  Rt: 30 sec x2 trials and Lt: 30 x2 trials   Standing on One Leg Able to lift leg  independently and hold equal to or more than 3 seconds  L: 10 sec, R: 3 sec   Total Score 48   Timed Up and Go Test   TUG Normal TUG   Normal TUG (seconds) 12.8  UE support on thighs                             PT Education - 09/08/15 0945    Education provided Yes   Education Details discussed progress and POC; encouraged walking program and access to a pool for improved fitness; reviewed importance of deep abdominal strength/endurance to improve activity tolerance   Person(s) Educated Patient   Methods Explanation   Comprehension Verbalized understanding          PT Short Term Goals - 09/08/15 1125    PT SHORT TERM GOAL #1   Title Pt will demo understanding and indep with updated HEP.   Time 2   Period Weeks   Status Achieved   PT SHORT TERM GOAL #2   Title Pt will correctly repeat and demo correct log rolling technique during sit to/from supine without cuing from therapist   Baseline pt not consistently performing without therapist cuing   Time 2   Period Weeks   Status On-going   PT SHORT TERM GOAL #3   Title Pt will demo improved pain report of no greater than 5/10 throughout the day to improve his participation in ADLs.   Time 4   Period Weeks   Status On-going           PT Long Term Goals - 09/08/15 1126    PT LONG TERM GOAL #1   Title Pt will demo improved LE strength to atleast 4/5 throughout BLE to improve his safety with sit to stand transitions.    Baseline atleast 4/5 via MMT of BLE   Time 8   Period Weeks   Status Achieved   PT LONG TERM GOAL #2   Title Pt will demo improved functional strength and endurance evident by an improvement in 5x sit to stand to less than 12 sec without use of UE   Baseline Met 09/08/15: Pt will demo improved functional strength and endurance evident by an improvement in 5x sit to stand to less than 30 sec without use of UE - 22 sec   Time 8   Period Weeks   Status Revised   PT LONG TERM GOAL #3    Title Pt will demonstrate improved TUG time in less than 14 sec and without and AD to decrease his risk of falls in the community.    Baseline 09/08/15 12.8 sec   Time 8   Period Weeks   Status Achieved   PT LONG TERM GOAL #4   Title Pt will demonstrate improved balance evident by a Berg Balance Score improvement of atleast 4 points to decrease his risk of falls and injury.   Baseline initial: 41; 09/08/15: 48   Time 8   Period Weeks   Status On-going   PT LONG TERM GOAL #  5   Title Pt will report decrease in pain to no greater than 3/10 to improve his tolerance of activity and ADLs.    Time 8   Period Weeks   Status On-going               Plan - 09-27-2015 1112    Clinical Impression Statement Pt was reassessed this session having made significant progress towards his goals for BLE strength, balance and functional testing. He is now able to sit/stand with decreased use of his UE and his Berg balance test has improved from 41 to 48 since beginning with PT. Overall, he feels he has made a great deal of improvement however his back pain is largely unchanged. He continues to report pain with flexion and rotational activity and some improvement when he is able to extend the spine however his surgeon doesn't want to do another surgery unless absolutely necessary. PT discussed the nature of back pain and POC moving forward to address the deep abdominal strength/stability for improved support of the spine during activity. Pt verbalizing understanding at this time.    Rehab Potential Fair   Clinical Impairments Affecting Rehab Potential chronicity of back pain   PT Frequency 2x / week   PT Duration 4 weeks   PT Treatment/Interventions ADLs/Self Care Home Management;Aquatic Therapy;Moist Heat;Patient/family education;Neuromuscular re-education;Balance training;Therapeutic exercise;Therapeutic activities;Functional mobility training;Stair training;Gait training;Manual techniques;Passive range of  motion;Energy conservation   PT Next Visit Plan work on deep abdominal activation in supine, make sure he can activate correctly then progress length of hold and reps; once able to do this, introduce LE-->UE movement; balance and strength focus on last 15 min or so of session to ensure he continues to perform HEP correctly   PT Home Exercise Plan No updates this session.    Recommended Other Services None   Consulted and Agree with Plan of Care Patient      Patient will benefit from skilled therapeutic intervention in order to improve the following deficits and impairments:  Abnormal gait, Decreased activity tolerance, Decreased balance, Decreased endurance, Decreased safety awareness, Decreased range of motion, Decreased mobility, Decreased strength, Difficulty walking, Hypomobility, Impaired flexibility, Increased muscle spasms, Improper body mechanics, Postural dysfunction, Pain  Visit Diagnosis: Muscle weakness (generalized)  Unsteadiness on feet  Midline low back pain, with sciatica presence unspecified  Other abnormalities of gait and mobility  Other symptoms and signs involving the musculoskeletal system       G-Codes - Sep 27, 2015 1131    Functional Assessment Tool Used clinical judgement based on assessment of ROM, strength and functional mobility   Functional Limitation Mobility: Walking and moving around   Mobility: Walking and Moving Around Current Status [phone]) At least 40 percent but less than 60 percent impaired, limited or restricted   Mobility: Walking and Moving Around Goal Status [phone]) At least 40 percent but less than 60 percent impaired, limited or restricted      Problem List Patient Active Problem List   Diagnosis Date Noted  . Hyperlipidemia 04/02/2015  . Essential hypertension, benign 04/02/2015  . Other specified hypothyroidism 04/02/2015  . History of colonic polyps   . Encounter for screening colonoscopy 05/07/2014  . Psoriasis 10/15/2013  .  Hypoxemia 09/05/2013  . SVT (supraventricular tachycardia) ([place]) 06/06/2013  . COPD (chronic obstructive pulmonary disease) ([place]) 06/06/2013  . Cardiac pacemaker in situ 06/06/2013  . Pacemaker 05/22/2013  . Cirrhosis ([place]) 01/22/2013  . Dyspepsia 01/22/2013  . Anemia 01/22/2013  . Atrial fibrillation ([place]) 11/21/2012  .  Atrial flutter ([place]) 11/21/2012  . Chest pain 11/20/2012  .  Acute on chronic diastolic heart failure, predominantly Right heart failure 01/09/2012  . Syncope 01/06/2012  . Sinus arrest, 5 second pause on event monitor 01/06/2012  . Type II diabetes mellitus, uncontrolled ([place]) 01/06/2012  . Obesity 01/06/2012  . Smoker 01/06/2012  . Obstructive sleep apnea 01/06/2012  . DJD, on disability secondary to back pain, surg X 3 01/06/2012  . ABDOMINAL PAIN, RIGHT UPPER QUADRANT 06/14/2009  . ABDOMINAL PAIN 05/14/2009  . Hepatic cirrhosis ([place]) 01/13/2009  . GERD 12/04/2008  . GASTRITIS, ACUTE 12/04/2008  . HOARSENESS 12/04/2008  . CHEST PAIN 12/04/2008  . Dysphagia, pharyngoesophageal phase 12/04/2008   11:44 AM,09/08/2015 Elly Modena PT, DPT [facility] [place] [address] [place], [place], [address] Phone: [phone]   Fax:  [phone]  Name: Ralph [name] MRN: [id number] Date of Birth: 1950/04/19

## 2015-09-10 ENCOUNTER — Ambulatory Visit (HOSPITAL_COMMUNITY): Payer: Medicare HMO | Attending: Neurology | Admitting: Physical Therapy

## 2015-09-10 DIAGNOSIS — M545 Low back pain: Secondary | ICD-10-CM

## 2015-09-10 DIAGNOSIS — R2689 Other abnormalities of gait and mobility: Secondary | ICD-10-CM

## 2015-09-10 DIAGNOSIS — M6281 Muscle weakness (generalized): Secondary | ICD-10-CM

## 2015-09-10 DIAGNOSIS — R29898 Other symptoms and signs involving the musculoskeletal system: Secondary | ICD-10-CM | POA: Diagnosis not present

## 2015-09-10 DIAGNOSIS — R2681 Unsteadiness on feet: Secondary | ICD-10-CM

## 2015-09-10 NOTE — Therapy (Signed)
[place], [place], [address] Phone: [phone]   Fax:  [phone]  Physical Therapy Treatment  Patient Details  Name: Ralph [name] MRN: [id number] Date of Birth: 06-29-1950 Referring Provider: Narda Amber, MD  Encounter Date: 09/10/2015      PT End of Session - 09/10/15 1032    Visit Number 9   Number of Visits 16   Date for PT Re-Evaluation 10/06/15   Authorization Type AETNA Medicare (G-Codes done 8th visit)   Authorization Time Period 08/10/15 to 10/06/15   Authorization - Visit Number 9   Authorization - Number of Visits 16   PT Start Time 0948   PT Stop Time 1030   PT Time Calculation (min) 42 min   Equipment Utilized During Treatment Gait belt   Activity Tolerance Patient tolerated treatment well   Behavior During Therapy [facility] for tasks assessed/performed      Past Medical History  Diagnosis Date  . Hypertension   . 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 Depressed   . Varicose vein     of esophagus  . CHF (congestive heart failure) ([place]) 01/06/2012  . Sinus pause 01/06/2012    5.2 seconds  . Anginal pain ([place])   . Sleep apnea     "don't wear mask" (01/06/2012)  . Emphysema   . Type II diabetes mellitus ([place])   . GERD (gastroesophageal reflux disease)   . H/O hiatal hernia   . Coughing up blood     "comes from my throat" (01/06/2012)  . Arthritis     "back; fingers" (01/06/2012)  . Chronic lower back pain   . Fatty liver disease, nonalcoholic   . CHB (complete heart block) ([place])   . Presence of permanent cardiac pacemaker 9/292013    [employer]ude  . Atrial flutter [facility])     s/p EPS +RF ablation of typical atrial flutter April 2015  . Pacemaker   . Stroke [facility])     pt states that he might have had a stroke not sure  . Pneumonia Aug 2016  . Hypothyroidism   . Headache   . DDD (degenerative disc disease), lumbar   . Cancer ([place])     skin cancer  . Difficult intubation     Eschmann  stylet used in 2002 and 2007; "trouble waking up afterwards" (01/06/2012)    Past Surgical History  Procedure Laterality Date  . Back surgery    . Spinal cord stimulator implant  2006  . Cholecystectomy  1993  . Nasal septum surgery  1992  . Tonsillectomy and adenoidectomy  1992  . Posterior fusion lumbar spine  1999    L4-5  . Lumbar disc surgery  1994; ~ 1995; ~ 1996  . Warthin's tumor excision  1990's    right  . Permanent pacemaker insertion  01/08/2012    CHB  . US echocardiography  12/28/2011    mild LVH,mild mitral annulara ca+,mild MR  . Nuclear stress test  10/19/2004    No ischemia  . Esophagogastroduodenoscopy  11/08/2004    HAF:BXUXYB esophageal erosions consistent with erosive reflux esophagitis/Areas of hemorrhage and nodularity of the fundal mucosa of uncertain significance, biopsied.  Small hiatal hernia, otherwise normal stomach  . Colonoscopy  11/08/2004    FXO:VANVBT rectum, colon, TI.  Marland Kitchen Esophagogastroduodenoscopy  2010    Dr. Gala Romney: 3 columns Grade 1 varices, erosive esophagitis, HH, portal gastropathy, normal D1, D2  . Esophagogastroduodenoscopy (  egd) with esophageal dilation N/A 02/14/2013    RMR:Grade 1 esophageal varices. Abnormal distal esophagus/status post biopsy after [place] dilation. Portal gastropathy. Antral erosions-status post biopsy. path negative for H.pylori, benign path.  . Permanent pacemaker insertion N/A 01/09/2012    Procedure: PERMANENT PACEMAKER INSERTION;  Surgeon: Mihai Croitoru, MD;  [facility]: [facility] CATH LAB;  Service: Cardiovascular;  Laterality: N/A;  . Atrial flutter ablation N/A 07/10/2013    Procedure: ATRIAL FLUTTER ABLATION;  Surgeon: Gregg W Taylor, MD;  Location: [facility] CATH LAB;  Service: Cardiovascular;  Laterality: N/A;  . Colonoscopy N/A 05/28/2014    Dr. Rourk: Redundant colon. single colonic polyp removed as described above. Tubular adenoma  . Esophagogastroduodenoscopy N/A 05/28/2014    Dr. Rourk: MIld erosive reflux esophagitis.  Grade 1 esophageal varices. Patent esophagus. No dilation performed. Hiatal hernia.   . Esophageal dilation N/A 05/28/2014    Procedure: ESOPHAGEAL DILATION;  Surgeon: Robert M Rourk, MD;  Location: [facility] ENDO SUITE;  Service: Endoscopy;  Laterality: N/A;  . Spinal cord stimulator removal N/A 01/27/2015    Procedure: LUMBAR SPINAL CORD STIMULATOR REMOVAL;  Surgeon: Henry Elsner, MD;  Location: [facility] NEURO ORS;  Service: Neurosurgery;  Laterality: N/A;  LUMBAR SPINAL CORD STIMULATOR REMOVAL    There were no vitals filed for this visit.      Subjective Assessment - 09/10/15 0949    Subjective Pt states he has nothing new to report since his last session. He cleared brush the other day and reports his legs are sore. He didn't want to let his back stop him from staying active.   Pertinent History HTN, DMII, pacemaker, L4-L5 fusion   Limitations Other (comment)  getting up from half kneel position due to weakness; back limits ADLs when at its worst.    How long can you sit comfortably? 2 hours    How long can you stand comfortably? prefers standing over sitting and unable for long periods of time because his legs get tired.   How long can you walk comfortably? depends: sometimes unlimited and other times he can walk to the car and his back starts to hurt so bad he can't move for a few seconds.   Patient Stated Goals get stronger and mobility.   Currently in Pain? Yes   Pain Location --  low back   Pain Orientation Mid   Pain Descriptors / Indicators Aching   Pain Type Chronic pain   Pain Onset More than a month ago                         [facility] Adult PT Treatment/Exercise - 09/10/15 0001    Lumbar Exercises: Supine   Ab Set 15 reps;5 seconds   AB Set Limitations tactile/verbal cues to perform with correct technique   Other Supine Lumbar Exercises ab set with bent knee raise x15 each, x3 sec hold             Balance Exercises - 09/10/15 1015    Balance Exercises:  Standing   Standing Eyes Closed Narrow base of support (BOS);2 reps  external perturbations   Tandem Stance Eyes closed;3 reps  L: 5-30 sec; R: 6-9 sec   SLS Solid surface;3 reps  Max 15 sec each   Balance Beam x1 RT with partial tandem stance and CGA x2 LOB noted           PT Education - 09/10/15 1031    Education provided Yes   Education Details updated   HEP; correct technique and importance of performing HEP for increased trunk strength/endurance   Person(s) Educated Patient   Methods Explanation;Handout   Comprehension Verbalized understanding;Returned demonstration;Verbal cues required          PT Short Term Goals - 09/08/15 1125    PT SHORT TERM GOAL #1   Title Pt will demo understanding and indep with updated HEP.   Time 2   Period Weeks   Status Achieved   PT SHORT TERM GOAL #2   Title Pt will correctly repeat and demo correct log rolling technique during sit to/from supine without cuing from therapist   Baseline pt not consistently performing without therapist cuing   Time 2   Period Weeks   Status On-going   PT SHORT TERM GOAL #3   Title Pt will demo improved pain report of no greater than 5/10 throughout the day to improve his participation in ADLs.   Time 4   Period Weeks   Status On-going           PT Long Term Goals - 09/08/15 1126    PT LONG TERM GOAL #1   Title Pt will demo improved LE strength to atleast 4/5 throughout BLE to improve his safety with sit to stand transitions.    Baseline atleast 4/5 via MMT of BLE   Time 8   Period Weeks   Status Achieved   PT LONG TERM GOAL #2   Title Pt will demo improved functional strength and endurance evident by an improvement in 5x sit to stand to less than 12 sec without use of UE   Baseline Met 09/08/15: Pt will demo improved functional strength and endurance evident by an improvement in 5x sit to stand to less than 30 sec without use of UE - 22 sec   Time 8   Period Weeks   Status Revised   PT LONG  TERM GOAL #3   Title Pt will demonstrate improved TUG time in less than 14 sec and without and AD to decrease his risk of falls in the community.    Baseline 09/08/15 12.8 sec   Time 8   Period Weeks   Status Achieved   PT LONG TERM GOAL #4   Title Pt will demonstrate improved balance evident by a Berg Balance Score improvement of atleast 4 points to decrease his risk of falls and injury.   Baseline initial: 41; 09/08/15: 48   Time 8   Period Weeks   Status On-going   PT LONG TERM GOAL #5   Title Pt will report decrease in pain to no greater than 3/10 to improve his tolerance of activity and ADLs.    Time 8   Period Weeks   Status On-going               Plan - 09/10/15 1033    Clinical Impression Statement Today's session focused on therex to address deep abdominal musculature strength and endurance. Pt demonstrating good understanding of correct technique after verbal/tactile cues from PT. Also discussed use of lumbar support during long periods of sitting to improve overall posture and decrease irritability of back pain. Will continue with current POC.    Rehab Potential Fair   Clinical Impairments Affecting Rehab Potential chronicity of back pain   PT Frequency 2x / week   PT Duration 4 weeks   PT Treatment/Interventions ADLs/Self Care Home Management;Aquatic Therapy;Moist Heat;Patient/family education;Neuromuscular re-education;Balance training;Therapeutic exercise;Therapeutic activities;Functional mobility training;Stair training;Gait training;Manual techniques;Passive range of motion;Energy conservation  PT Next Visit Plan work on deep abdominal activation in supine, make sure he can activate correctly then progress length of hold and reps; once able to do this, introduce LE-->UE movement; balance and strength focus on last 15 min or so of session to ensure he continues to perform HEP correctly   PT Home Exercise Plan updated with ab set and ab set with alt knee flexion    Consulted and Agree with Plan of Care Patient      Patient will benefit from skilled therapeutic intervention in order to improve the following deficits and impairments:  Abnormal gait, Decreased activity tolerance, Decreased balance, Decreased endurance, Decreased safety awareness, Decreased range of motion, Decreased mobility, Decreased strength, Difficulty walking, Hypomobility, Impaired flexibility, Increased muscle spasms, Improper body mechanics, Postural dysfunction, Pain  Visit Diagnosis: Muscle weakness (generalized)  Unsteadiness on feet  Midline low back pain, with sciatica presence unspecified  Other abnormalities of gait and mobility  Other symptoms and signs involving the musculoskeletal system     Problem List Patient Active Problem List   Diagnosis Date Noted  . Hyperlipidemia 04/02/2015  . Essential hypertension, benign 04/02/2015  . Other specified hypothyroidism 04/02/2015  . History of colonic polyps   . Encounter for screening colonoscopy 05/07/2014  . Psoriasis 10/15/2013  . Hypoxemia 09/05/2013  . SVT (supraventricular tachycardia) ([facility]) 06/06/2013  . COPD (chronic obstructive pulmonary disease) (HCC) 06/06/2013  . Cardiac pacemaker in situ 06/06/2013  . Pacemaker 05/22/2013  . Cirrhosis ([facility]) 01/22/2013  . Dyspepsia 01/22/2013  . Anemia 01/22/2013  . Atrial fibrillation ([facility]) 11/21/2012  . Atrial flutter ([facility]) 11/21/2012  . Chest pain 11/20/2012  .  Acute on chronic diastolic heart failure, predominantly Right heart failure 01/09/2012  . Syncope 01/06/2012  . Sinus arrest, 5 second pause on event monitor 01/06/2012  . Type II diabetes mellitus, uncontrolled ([facility]) 01/06/2012  . Obesity 01/06/2012  . Smoker 01/06/2012  . Obstructive sleep apnea 01/06/2012  . DJD, on disability secondary to back pain, surg X 3 01/06/2012  . ABDOMINAL PAIN, RIGHT UPPER QUADRANT 06/14/2009  . ABDOMINAL PAIN 05/14/2009  . Hepatic cirrhosis (HCC) 01/13/2009  . GERD  12/04/2008  . GASTRITIS, ACUTE 12/04/2008  . HOARSENESS 12/04/2008  . CHEST PAIN 12/04/2008  . Dysphagia, pharyngoesophageal phase 12/04/2008    10:40 AM,09/10/2015 Sara Kiser PT, DPT [facility] [phone]  [facility] [address] [place], [place], [address] Phone: [phone]   Fax:  [phone]  Name: Watt H Miron MRN: [id number] Date of Birth: 11/10/1950     

## 2015-09-14 ENCOUNTER — Ambulatory Visit (HOSPITAL_COMMUNITY): Payer: Medicare HMO | Admitting: Physical Therapy

## 2015-09-14 DIAGNOSIS — M6281 Muscle weakness (generalized): Secondary | ICD-10-CM

## 2015-09-14 DIAGNOSIS — M545 Low back pain: Secondary | ICD-10-CM

## 2015-09-14 DIAGNOSIS — R29898 Other symptoms and signs involving the musculoskeletal system: Secondary | ICD-10-CM

## 2015-09-14 DIAGNOSIS — R2689 Other abnormalities of gait and mobility: Secondary | ICD-10-CM

## 2015-09-14 DIAGNOSIS — R2681 Unsteadiness on feet: Secondary | ICD-10-CM

## 2015-09-14 NOTE — Therapy (Signed)
[place], [place], [address] Phone: [phone]   Fax:  [phone]  Physical Therapy Treatment  Patient Details  Name: Ralph [name] MRN: [id number] Date of Birth: 03/28/1951 Referring Provider: Narda Amber, MD  Encounter Date: 09/14/2015      PT End of Session - 09/14/15 1025    Visit Number 10   Number of Visits 16   Authorization Type AETNA Medicare (G-Codes done 8th visit)   Authorization Time Period 08/10/15 to 10/06/15   Authorization - Visit Number 10   Authorization - Number of Visits 18   PT Start Time 0950   PT Stop Time 1035   PT Time Calculation (min) 45 min   Activity Tolerance Patient tolerated treatment well   Behavior During Therapy [facility] for tasks assessed/performed      Past Medical History  Diagnosis Date  . Hypertension   . 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 Depressed   . Varicose vein     of esophagus  . CHF (congestive heart failure) ([place]) 01/06/2012  . Sinus pause 01/06/2012    5.2 seconds  . Anginal pain ([place])   . Sleep apnea     "don't wear mask" (01/06/2012)  . Emphysema   . Type II diabetes mellitus ([place])   . GERD (gastroesophageal reflux disease)   . H/O hiatal hernia   . Coughing up blood     "comes from my throat" (01/06/2012)  . Arthritis     "back; fingers" (01/06/2012)  . Chronic lower back pain   . Fatty liver disease, nonalcoholic   . CHB (complete heart block) ([place])   . Presence of permanent cardiac pacemaker 9/292013    [employer]  . Atrial flutter [facility])     s/p EPS +RF ablation of typical atrial flutter April 2015  . Pacemaker   . Stroke [facility])     pt states that he might have had a stroke not sure  . Pneumonia Aug 2016  . Hypothyroidism   . Headache   . DDD (degenerative disc disease), lumbar   . Cancer ([place])     skin cancer  . Difficult intubation     Eschmann stylet used in 2002 and 2007; "trouble waking up afterwards" (01/06/2012)    Past  Surgical History  Procedure Laterality Date  . Back surgery    . Spinal cord stimulator implant  2006  . Cholecystectomy  1993  . Nasal septum surgery  1992  . Tonsillectomy and adenoidectomy  1992  . Posterior fusion lumbar spine  1999    L4-5  . Lumbar disc surgery  1994; ~ 1995; ~ 1996  . Warthin's tumor excision  1990's    right  . Permanent pacemaker insertion  01/08/2012    CHB  . US echocardiography  12/28/2011    mild LVH,mild mitral annulara ca+,mild MR  . Nuclear stress test  10/19/2004    No ischemia  . Esophagogastroduodenoscopy  11/08/2004    TKP:TWSFKC esophageal erosions consistent with erosive reflux esophagitis/Areas of hemorrhage and nodularity of the fundal mucosa of uncertain significance, biopsied.  Small hiatal hernia, otherwise normal stomach  . Colonoscopy  11/08/2004    LEX:NTZGYF rectum, colon, TI.  Marland Kitchen Esophagogastroduodenoscopy  2010    Dr. Gala Romney: 3 columns Grade 1 varices, erosive esophagitis, HH, portal gastropathy, normal D1, D2  . Esophagogastroduodenoscopy (egd) with esophageal dilation N/A 02/14/2013    VCB:SWHQP 1 esophageal varices. Abnormal distal  esophagus/status post biopsy after [facility] dilation. Portal gastropathy. Antral erosions-status post biopsy. path negative for H.pylori, benign path.  . Permanent pacemaker insertion N/A 01/09/2012    Procedure: PERMANENT PACEMAKER INSERTION;  Surgeon: Sanda Klein, MD;  Location: [facility] CATH LAB;  Service: Cardiovascular;  Laterality: N/A;  . Atrial flutter ablation N/A 07/10/2013    Procedure: ATRIAL FLUTTER ABLATION;  Surgeon: Evans Lance, MD;  Location: [facility] CATH LAB;  Service: Cardiovascular;  Laterality: N/A;  . Colonoscopy N/A 05/28/2014    Dr. Gala Romney: Redundant colon. single colonic polyp removed as described above. Tubular adenoma  . Esophagogastroduodenoscopy N/A 05/28/2014    Dr. Gala Romney: MIld erosive reflux esophagitis. Grade 1 esophageal varices. Patent esophagus. No dilation performed. Hiatal hernia.   .  Esophageal dilation N/A 05/28/2014    Procedure: ESOPHAGEAL DILATION;  Surgeon: Daneil Dolin, MD;  [facility] ENDO SU[facility];  Service: Endoscopy;  Laterality: N/A;  . Spinal cord stimulator removal N/A 01/27/2015    Procedure: LUMBAR SPINAL CORD STIMULATOR REMOVAL;  Surgeon: Kristeen Miss, MD;  Location: [facility] NEURO ORS;  Service: Neurosurgery;  Laterality: N/A;  LUMBAR SPINAL CORD STIMULATOR REMOVAL    There were no vitals filed for this visit.      Subjective Assessment - 09/14/15 1037    Subjective PT states he can tell his balance is getting better. Comes today without AD and states he was able to bend over to put air in his lawnmower tire and stand back up without difficulty  [place] rac[place].  Pt states his pain is still high at 7-8/10 in his lumbar region and his legs are still sore.                         [place] Adult PT Treatment/Exercise - 09/14/15 1018    Lumbar Exercises: Supine   Ab Set 15 reps;5 seconds   AB Set Limitations tactile/verbal cues to perform with correct technique   Other Supine Lumbar Exercises ab set with bent knee raise x15 each, x3 sec hold   Knee/Hip Exercises: Stretches   Active Hamstring Stretch Both;3 reps;30 seconds   Active Hamstring Stretch Limitations standing 12" step    Knee/Hip Exercises: Standing   Heel Raises Both;15 reps   Forward Lunges Both;10 reps   Forward Lunges Limitations on floor   SLS bilaterally Rt: 16", Lt 13" max of 5 trials no UE's   SLS with Vectors 10x3" Bil with intermittent UE A   Knee/Hip Exercises: Sidelying   Hip ABduction Both;15 reps   Hip ABduction Limitations with abd sets                PT Education - 09/14/15 1023    Education provided Yes   Education Details educated on importance of wearing socks and proper footwear for diabetes   Person(s) Educated Patient   Methods Explanation   Comprehension Verbalized understanding          PT Short Term Goals - 09/08/15  1125    PT SHORT TERM GOAL #1   Title Pt will demo understanding and indep with updated HEP.   Time 2   Period Weeks   Status Achieved   PT SHORT TERM GOAL #2   Title Pt will correctly repeat and demo correct log rolling technique during sit to/from supine without cuing from therapist   Baseline pt not consistently performing without therapist cuing   Time 2   Period Weeks   Status  On-going   PT SHORT TERM GOAL #3   Title Pt will demo improved pain report of no greater than 5/10 throughout the day to improve his participation in ADLs.   Time 4   Period Weeks   Status On-going           PT Long Term Goals - 09/08/15 1126    PT LONG TERM GOAL #1   Title Pt will demo improved LE strength to atleast 4/5 throughout BLE to improve his safety with sit to stand transitions.    Baseline atleast 4/5 via MMT of BLE   Time 8   Period Weeks   Status Achieved   PT LONG TERM GOAL #2   Title Pt will demo improved functional strength and endurance evident by an improvement in 5x sit to stand to less than 12 sec without use of UE   Baseline Met 09/08/15: Pt will demo improved functional strength and endurance evident by an improvement in 5x sit to stand to less than 30 sec without use of UE - 22 sec   Time 8   Period Weeks   Status Revised   PT LONG TERM GOAL #3   Title Pt will demonstrate improved TUG time in less than 14 sec and without and AD to decrease his risk of falls in the community.    Baseline 09/08/15 12.8 sec   Time 8   Period Weeks   Status Achieved   PT LONG TERM GOAL #4   Title Pt will demonstrate improved balance evident by a Berg Balance Score improvement of atleast 4 points to decrease his risk of falls and injury.   Baseline initial: 41; 09/08/15: 48   Time 8   Period Weeks   Status On-going   PT LONG TERM GOAL #5   Title Pt will report decrease in pain to no greater than 3/10 to improve his tolerance of activity and ADLs.    Time 8   Period Weeks   Status On-going                Plan - 09/14/15 1033    Clinical Impression Statement Continued focus on improving core musculature and overall stability.  Improved SLS time today to 13-16 seconds without UE assist.  Resumed vector stance to further improve stability.  Cues to engage core while completing standing balance actvities.  Educated on proper footwear and use of socks since he is diabetic.  Also noted a small bandaid over Rt 1st MTP that pt is unsure what caused, however  may have occured from rubbing on shoes.  Pt also reminded about receiving yearly diabetic shoes from insurance.  Able to complete session wtihout increased c/o pain or any pain during session.   Rehab Potential Fair   Clinical Impairments Affecting Rehab Potential chronicity of back pain   PT Frequency 2x / week   PT Duration 4 weeks   PT Treatment/Interventions ADLs/Self Care Home Management;Aquatic Therapy;Moist Heat;Patient/family education;Neuromuscular re-education;Balance training;Therapeutic exercise;Therapeutic activities;Functional mobility training;Stair training;Gait training;Manual techniques;Passive range of motion;Energy conservation   PT Next Visit Plan work on deep abdominal activation in supine, make sure he can activate correctly then progress length of hold and reps; once able to do this, introduce LE-->UE movement; balance and strength focus on last 15 min or so of session to ensure he continues to perform HEP correctly   PT Home Exercise Plan updated with ab set and ab set with alt knee flexion   Consulted and Agree with Plan  of Care Patient      Patient will benefit from skilled therapeutic intervention in order to improve the following deficits and impairments:  Abnormal gait, Decreased activity tolerance, Decreased balance, Decreased endurance, Decreased safety awareness, Decreased range of motion, Decreased mobility, Decreased strength, Difficulty walking, Hypomobility, Impaired flexibility, Increased muscle  spasms, Improper body mechanics, Postural dysfunction, Pain  Visit Diagnosis: Muscle weakness (generalized)  Unsteadiness on feet  Midline low back pain, with sciatica presence unspecified  Other abnormalities of gait and mobility  Other symptoms and signs involving the musculoskeletal system     Problem List Patient Active Problem List   Diagnosis Date Noted  . Hyperlipidemia 04/02/2015  . Essential hypertension, benign 04/02/2015  . Other specified hypothyroidism 04/02/2015  . History of colonic polyps   . Encounter for screening colonoscopy 05/07/2014  . Psoriasis 10/15/2013  . Hypoxemia 09/05/2013  . SVT (supraventricular tachycardia) ([place]) 06/06/2013  . COPD (chronic obstructive pulmonary disease) ([place]) 06/06/2013  . Cardiac pacemaker in situ 06/06/2013  . Pacemaker 05/22/2013  . Cirrhosis ([place]) 01/22/2013  . Dyspepsia 01/22/2013  . Anemia 01/22/2013  . Atrial fibrillation ([place]) 11/21/2012  . Atrial flutter ([place]) 11/21/2012  . Chest pain 11/20/2012  .  Acute on chronic diastolic heart failure, predominantly Right heart failure 01/09/2012  . Syncope 01/06/2012  . Sinus arrest, 5 second pause on event monitor 01/06/2012  . Type II diabetes mellitus, uncontrolled ([place]) 01/06/2012  . Obesity 01/06/2012  . Smoker 01/06/2012  . Obstructive sleep apnea 01/06/2012  . DJD, on disability secondary to back pain, surg X 3 01/06/2012  . ABDOMINAL PAIN, RIGHT UPPER QUADRANT 06/14/2009  . ABDOMINAL PAIN 05/14/2009  . Hepatic cirrhosis ([place]) 01/13/2009  . GERD 12/04/2008  . GASTRITIS, ACUTE 12/04/2008  . HOARSENESS 12/04/2008  . CHEST PAIN 12/04/2008  . Dysphagia, pharyngoesophageal phase 12/04/2008    Teena Irani, PTA/CLT [phone]  09/14/2015, 10:38 AM  [place] [address] [place], [place], [address] Phone: [phone]   Fax:  [phone]  Name: Ralph [name] MRN: [id number] Date of Birth: 06-Aug-1950

## 2015-09-17 ENCOUNTER — Ambulatory Visit (HOSPITAL_COMMUNITY): Payer: Medicare HMO

## 2015-09-17 DIAGNOSIS — M545 Low back pain: Secondary | ICD-10-CM

## 2015-09-17 DIAGNOSIS — R2689 Other abnormalities of gait and mobility: Secondary | ICD-10-CM

## 2015-09-17 DIAGNOSIS — R2681 Unsteadiness on feet: Secondary | ICD-10-CM

## 2015-09-17 DIAGNOSIS — M6281 Muscle weakness (generalized): Secondary | ICD-10-CM

## 2015-09-17 DIAGNOSIS — R29898 Other symptoms and signs involving the musculoskeletal system: Secondary | ICD-10-CM

## 2015-09-17 NOTE — Patient Instructions (Signed)
   Lower Abdominal Strengthening, [place]  Lay flat and keep your stomach flat (Ab set) and pelvis still lift one leg and the opposite arm to that leg. Lower it and repeat with the other arm and leg.   x15 reps

## 2015-09-17 NOTE — Therapy (Signed)
Ralph [name], [place], [address] Phone: [phone]   Fax:  [phone]  Physical Therapy Treatment  Patient Details  Name: Ralph [name] MRN: [id number] Date of Birth: 1951/03/27 Referring Provider: Narda Amber, MD  Encounter Date: 09/17/2015      PT End of Session - 09/17/15 1040    Visit Number 11   Number of Visits 16   Date for PT Re-Evaluation 10/06/15   Authorization Type AETNA Medicare (G-Codes done 8th visit)   Authorization Time Period 08/10/15 to 10/06/15   Authorization - Visit Number 11   Authorization - Number of Visits 18   PT Start Time 3557   PT Stop Time 1032   PT Time Calculation (min) 44 min   Equipment Utilized During Treatment Gait belt   Activity Tolerance Patient tolerated treatment well   Behavior During Therapy [facility] for tasks assessed/performed      Past Medical History  Diagnosis Date  . Hypertension   . 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 Depressed   . Varicose vein     of esophagus  . CHF (congestive heart failure) ([place]) 01/06/2012  . Sinus pause 01/06/2012    5.2 seconds  . Anginal pain ([place])   . Sleep apnea     "don't wear mask" (01/06/2012)  . Emphysema   . Type II diabetes mellitus ([place])   . GERD (gastroesophageal reflux disease)   . H/O hiatal hernia   . Coughing up blood     "comes from my throat" (01/06/2012)  . Arthritis     "back; fingers" (01/06/2012)  . Chronic lower back pain   . Fatty liver disease, nonalcoholic   . CHB (complete heart block) ([place])   . Presence of permanent cardiac pacemaker 9/292013    St.Jude  . Atrial flutter [facility])     s/p EPS +RF ablation of typical atrial flutter April 2015  . Pacemaker   . Stroke [facility])     pt states that he might have had a stroke not sure  . Pneumonia Aug 2016  . Hypothyroidism   . Headache   . DDD (degenerative disc disease), lumbar   . Cancer ([place])     skin cancer  . Difficult intubation    Eschmann stylet used in 2002 and 2007; "trouble waking up afterwards" (01/06/2012)    Past Surgical History  Procedure Laterality Date  . Back surgery    . Spinal cord stimulator implant  2006  . Cholecystectomy  1993  . Nasal septum surgery  1992  . Tonsillectomy and adenoidectomy  1992  . Posterior fusion lumbar spine  1999    L4-5  . Lumbar disc surgery  1994; ~ 1995; ~ 1996  . Warthin's tumor excision  1990's    right  . Permanent pacemaker insertion  01/08/2012    CHB  . US echocardiography  12/28/2011    mild LVH,mild mitral annulara ca+,mild MR  . Nuclear stress test  10/19/2004    No ischemia  . Esophagogastroduodenoscopy  11/08/2004    DUK:GURKYH esophageal erosions consistent with erosive reflux esophagitis/Areas of hemorrhage and nodularity of the fundal mucosa of uncertain significance, biopsied.  Small hiatal hernia, otherwise normal stomach  . Colonoscopy  11/08/2004    CWC:BJSEGB rectum, colon, TI.  Marland Kitchen Esophagogastroduodenoscopy  2010    Dr. Gala Romney: 3 columns Grade 1 varices, erosive esophagitis, HH, portal gastropathy, normal D1, D2  . Esophagogastroduodenoscopy (egd) with  esophageal dilation N/A 02/14/2013    XBM:WUXLK 1 esophageal varices. Abnormal distal esophagus/status post biopsy after Maloney dilation. Portal gastropathy. Antral erosions-status post biopsy. path negative for H.pylori, benign path.  . Permanent pacemaker insertion N/A 01/09/2012    Procedure: PERMANENT PACEMAKER INSERTION;  Surgeon: Sanda Klein, MD;  Location: [place] CATH LAB;  Service: Cardiovascular;  Laterality: N/A;  . Atrial flutter ablation N/A 07/10/2013    Procedure: ATRIAL FLUTTER ABLATION;  Surgeon: Evans Lance, MD;  Location: [facility] CATH LAB;  Service: Cardiovascular;  Laterality: N/A;  . Colonoscopy N/A 05/28/2014    Dr. Gala Romney: Redundant colon. single colonic polyp removed as described above. Tubular adenoma  . Esophagogastroduodenoscopy N/A 05/28/2014    Dr. Gala Romney: MIld erosive reflux  esophagitis. Grade 1 esophageal varices. Patent esophagus. No dilation performed. Hiatal hernia.   . Esophageal dilation N/A 05/28/2014    Procedure: ESOPHAGEAL DILATION;  Surgeon: Daneil Dolin, MD;  [facility];  Service: Endoscopy;  Laterality: N/A;  . Spinal cord stimulator removal N/A 01/27/2015    Procedure: LUMBAR SPINAL CORD STIMULATOR REMOVAL;  Surgeon: Kristeen Miss, MD;  Location: [place] NEURO ORS;  Service: Neurosurgery;  Laterality: N/A;  LUMBAR SPINAL CORD STIMULATOR REMOVAL    There were no vitals filed for this visit.      Subjective Assessment - 09/17/15 0956    Subjective Pt states that his pain is better.  Not using an AD today either.  When he turns quick, he looses his balance.     Pertinent History HTN, DMII, pacemaker, L4-L5 fusion   Limitations Other (comment)   How long can you sit comfortably? 2 hours    How long can you stand comfortably? prefers standing over sitting and unable for long periods of time because his legs get tired.   How long can you walk comfortably? depends: sometimes unlimited and other times he can walk to the car and his back starts to hurt so bad he can't move for a few seconds.   Patient Stated Goals get stronger and mobility.   Currently in Pain? Yes   Pain Score 5    Pain Location Back   Pain Orientation Mid;Lower   Pain Descriptors / Indicators Aching   Pain Type Chronic pain   Pain Radiating Towards none   Pain Onset More than a month ago   Pain Frequency Constant   Aggravating Factors  bending over to pick up an object; twisting.  Still with sudden pain - no motion associated with it.    Pain Relieving Factors sitting - pain subsides, but does not go away.     Effect of Pain on Daily Activities Pt states he feels like he is limited with everything because he is afraid that he is going to hurt.     Multiple Pain Sites No                         [facility] Adult PT Treatment/Exercise - 09/17/15 0001    Lumbar  Exercises: Supine   Ab Set 15 reps;3 seconds   Other Supine Lumbar Exercises ab set with bent knee raise x15 each, x5 sec hold   Other Supine Lumbar Exercises Ab set with UE & LE flexion and hold for 3 sec   Knee/Hip Exercises: Standing   Hip Abduction Stengthening;Both;2 sets;15 reps  // bars, increased pain in L hip with R hip abd.    Hip Extension Stengthening;Both;2 sets;15 reps  Balance Exercises - 09/17/15 1035    Balance Exercises: Standing   Standing Eyes Opened Narrow base of support (BOS);Foam/compliant surface  1 min   Standing Eyes Closed Narrow base of support (BOS);Foam/compliant surface  1 minute   Tandem Stance Eyes open;30 secs  each foot           PT Education - 09/17/15 1039    Education provided Yes   Education Details educated pt on importance of abdominal stabilization during ADL's and to stabilize while mobilizing.    Person(s) Educated Patient   Methods Explanation   Comprehension Verbalized understanding          PT Short Term Goals - 09/17/15 1049    PT SHORT TERM GOAL #1   Title Pt will demo understanding and indep with updated HEP.   Time 2   Period Weeks   Status Achieved   PT SHORT TERM GOAL #2   Title Pt will correctly repeat and demo correct log rolling technique during sit to/from supine without cuing from therapist   Baseline pt not consistently performing without therapist cuing   Time 2   Period Weeks   Status On-going   PT SHORT TERM GOAL #3   Title Pt will demo improved pain report of no greater than 5/10 throughout the day to improve his participation in ADLs.   Time 4   Period Weeks   Status On-going           PT Long Term Goals - 09/17/15 1049    PT LONG TERM GOAL #1   Title Pt will demo improved LE strength to atleast 4/5 throughout BLE to improve his safety with sit to stand transitions.    Baseline atleast 4/5 via MMT of BLE   Time 8   Period Weeks   Status Achieved   PT LONG TERM GOAL #2    Title Pt will demo improved functional strength and endurance evident by an improvement in 5x sit to stand to less than 12 sec without use of UE   Baseline Met 09/08/15: Pt will demo improved functional strength and endurance evident by an improvement in 5x sit to stand to less than 30 sec without use of UE - 22 sec   Time 8   Period Weeks   Status Revised   PT LONG TERM GOAL #3   Title Pt will demonstrate improved TUG time in less than 14 sec and without and AD to decrease his risk of falls in the community.    Baseline 09/08/15 12.8 sec   Time 8   Period Weeks   PT LONG TERM GOAL #4   Title Pt will demonstrate improved balance evident by a Berg Balance Score improvement of atleast 4 points to decrease his risk of falls and injury.   Baseline initial: 41; 09/08/15: 48   Time 8   Period Weeks   Status On-going   PT LONG TERM GOAL #5   Title Pt will report decrease in pain to no greater than 3/10 to improve his tolerance of activity and ADLs.    Time 8   Period Weeks   Status On-going               Plan - 09/17/15 1041    Clinical Impression Statement Pt demonstrated good activation of abdominals during review of HEP, and states that he catches himself at home and reminds himself to stabilize.  Pt was able to advance abdominal exercises to add in UE flexion  combined with LE flexion, as well as increased sets.  Pt requires multiple vc's for breathing throughout tx session today.  Pt expressed some mild irritation of the R hip during SLS vectors.     Rehab Potential Fair   Clinical Impairments Affecting Rehab Potential chronicity of back pain   PT Frequency 2x / week   PT Duration 4 weeks   PT Treatment/Interventions ADLs/Self Care Home Management;Aquatic Therapy;Moist Heat;Patient/family education;Neuromuscular re-education;Balance training;Therapeutic exercise;Therapeutic activities;Functional mobility training;Stair training;Gait training;Manual techniques;Passive range of  motion;Energy conservation   PT Next Visit Plan Continue to progress deep abdominal activation.  May progress to quadruped if pt able to tolerate.  Progress standing balance for SLS, as well as head turns and head nods.     PT Home Exercise Plan updated with ab set and ab set with alt knee flexion/UE flexion combination   Recommended Other Services None   Consulted and Agree with Plan of Care Patient      Patient will benefit from skilled therapeutic intervention in order to improve the following deficits and impairments:  Abnormal gait, Decreased activity tolerance, Decreased balance, Decreased endurance, Decreased safety awareness, Decreased range of motion, Decreased mobility, Decreased strength, Difficulty walking, Hypomobility, Impaired flexibility, Increased muscle spasms, Improper body mechanics, Postural dysfunction, Pain  Visit Diagnosis: Muscle weakness (generalized)  Unsteadiness on feet  Midline low back pain, with sciatica presence unspecified  Other abnormalities of gait and mobility  Other symptoms and signs involving the musculoskeletal system     Problem List Patient Active Problem List   Diagnosis Date Noted  . Hyperlipidemia 04/02/2015  . Essential hypertension, benign 04/02/2015  . Other specified hypothyroidism 04/02/2015  . History of colonic polyps   . Encounter for screening colonoscopy 05/07/2014  . Psoriasis 10/15/2013  . Hypoxemia 09/05/2013  . SVT (supraventricular tachycardia) ([place]) 06/06/2013  . COPD (chronic obstructive pulmonary disease) ([place]) 06/06/2013  . Cardiac pacemaker in situ 06/06/2013  . Pacemaker 05/22/2013  . Cirrhosis ([place]) 01/22/2013  . Dyspepsia 01/22/2013  . Anemia 01/22/2013  . Atrial fibrillation ([place]) 11/21/2012  . Atrial flutter ([place]) 11/21/2012  . Chest pain 11/20/2012  .  Acute on chronic diastolic heart failure, predominantly Right heart failure 01/09/2012  . Syncope 01/06/2012  . Sinus arrest, 5 second pause on event  monitor 01/06/2012  . Type II diabetes mellitus, uncontrolled ([place]) 01/06/2012  . Obesity 01/06/2012  . Smoker 01/06/2012  . Obstructive sleep apnea 01/06/2012  . DJD, on disability secondary to back pain, surg X 3 01/06/2012  . ABDOMINAL PAIN, RIGHT UPPER QUADRANT 06/14/2009  . ABDOMINAL PAIN 05/14/2009  . Hepatic cirrhosis ([place]) 01/13/2009  . GERD 12/04/2008  . GASTRITIS, ACUTE 12/04/2008  . HOARSENESS 12/04/2008  . CHEST PAIN 12/04/2008  . Dysphagia, pharyngoesophageal phase 12/04/2008    Beth Romey Mathieson, PT, DPT X: [phone]   [place] [address] [place], [place], [address] Phone: [phone]   Fax:  [phone]  Name: CAYETANO MIKITA MRN: [id number] Date of Birth: 1950-05-06

## 2015-09-20 ENCOUNTER — Other Ambulatory Visit: Payer: Self-pay | Admitting: "Endocrinology

## 2015-09-21 ENCOUNTER — Ambulatory Visit (HOSPITAL_COMMUNITY): Payer: Medicare HMO | Admitting: Physical Therapy

## 2015-09-21 DIAGNOSIS — M6281 Muscle weakness (generalized): Secondary | ICD-10-CM

## 2015-09-21 DIAGNOSIS — M545 Low back pain: Secondary | ICD-10-CM

## 2015-09-21 DIAGNOSIS — R29898 Other symptoms and signs involving the musculoskeletal system: Secondary | ICD-10-CM

## 2015-09-21 DIAGNOSIS — R2689 Other abnormalities of gait and mobility: Secondary | ICD-10-CM

## 2015-09-21 DIAGNOSIS — R2681 Unsteadiness on feet: Secondary | ICD-10-CM

## 2015-09-21 NOTE — Therapy (Signed)
[place], [place], [address] Phone: [phone]   Fax:  [phone]  Physical Therapy Treatment  Patient Details  Name: Ralph [name] MRN: [id number] Date of Birth: 01-14-1951 Referring Provider: Narda Amber, MD  Encounter Date: 09/21/2015      PT End of Session - 09/21/15 0954    Visit Number 12   Number of Visits 16   Date for PT Re-Evaluation 10/06/15   Authorization Type AETNA Medicare (G-Codes done 8th visit)   Authorization Time Period 08/10/15 to 10/06/15   Authorization - Visit Number 12   Authorization - Number of Visits 18   PT Start Time 3762   PT Stop Time 1030   PT Time Calculation (min) 43 min   Equipment Utilized During Treatment --   Activity Tolerance Patient tolerated treatment well   Behavior During Therapy [facility] for tasks assessed/performed      Past Medical History  Diagnosis Date  . Hypertension   . 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 Depressed   . Varicose vein     of esophagus  . CHF (congestive heart failure) ([place]) 01/06/2012  . Sinus pause 01/06/2012    5.2 seconds  . Anginal pain ([place])   . Sleep apnea     "don't wear mask" (01/06/2012)  . Emphysema   . Type II diabetes mellitus ([place])   . GERD (gastroesophageal reflux disease)   . H/O hiatal hernia   . Coughing up blood     "comes from my throat" (01/06/2012)  . Arthritis     "back; fingers" (01/06/2012)  . Chronic lower back pain   . Fatty liver disease, nonalcoholic   . CHB (complete heart block) ([place])   . Presence of permanent cardiac pacemaker 9/292013    [employer]ude  . Atrial flutter [facility])     s/p EPS +RF ablation of typical atrial flutter April 2015  . Pacemaker   . Stroke [facility])     pt states that he might have had a stroke not sure  . Pneumonia Aug 2016  . Hypothyroidism   . Headache   . DDD (degenerative disc disease), lumbar   . Cancer ([place])     skin cancer  . Difficult intubation     Eschmann  stylet used in 2002 and 2007; "trouble waking up afterwards" (01/06/2012)    Past Surgical History  Procedure Laterality Date  . Back surgery    . Spinal cord stimulator implant  2006  . Cholecystectomy  1993  . Nasal septum surgery  1992  . Tonsillectomy and adenoidectomy  1992  . Posterior fusion lumbar spine  1999    L4-5  . Lumbar disc surgery  1994; ~ 1995; ~ 1996  . Warthin's tumor excision  1990's    right  . Permanent pacemaker insertion  01/08/2012    CHB  . US echocardiography  12/28/2011    mild LVH,mild mitral annulara ca+,mild MR  . Nuclear stress test  10/19/2004    No ischemia  . Esophagogastroduodenoscopy  11/08/2004    GBT:DVVOHY esophageal erosions consistent with erosive reflux esophagitis/Areas of hemorrhage and nodularity of the fundal mucosa of uncertain significance, biopsied.  Small hiatal hernia, otherwise normal stomach  . Colonoscopy  11/08/2004    WVP:XTGGYI rectum, colon, TI.  Marland Kitchen Esophagogastroduodenoscopy  2010    Dr. Gala Romney: 3 columns Grade 1 varices, erosive esophagitis, HH, portal gastropathy, normal D1, D2  . Esophagogastroduodenoscopy (egd)  with esophageal dilation N/A 02/14/2013    OIB:BCWUG 1 esophageal varices. Abnormal distal esophagus/status post biopsy after Maloney dilation. Portal gastropathy. Antral erosions-status post biopsy. path negative for H.pylori, benign path.  . Permanent pacemaker insertion N/A 01/09/2012    Procedure: PERMANENT PACEMAKER INSERTION;  Surgeon: Sanda Klein, MD;  Location: [place] CATH LAB;  Service: Cardiovascular;  Laterality: N/A;  . Atrial flutter ablation N/A 07/10/2013    Procedure: ATRIAL FLUTTER ABLATION;  Surgeon: Evans Lance, MD;  Location: [facility] CATH LAB;  Service: Cardiovascular;  Laterality: N/A;  . Colonoscopy N/A 05/28/2014    Dr. Gala Romney: Redundant colon. single colonic polyp removed as described above. Tubular adenoma  . Esophagogastroduodenoscopy N/A 05/28/2014    Dr. Gala Romney: MIld erosive reflux esophagitis.  Grade 1 esophageal varices. Patent esophagus. No dilation performed. Hiatal hernia.   . Esophageal dilation N/A 05/28/2014    Procedure: ESOPHAGEAL DILATION;  Surgeon: Daneil Dolin, MD;  [facility];  Service: Endoscopy;  Laterality: N/A;  . Spinal cord stimulator removal N/A 01/27/2015    Procedure: LUMBAR SPINAL CORD STIMULATOR REMOVAL;  Surgeon: Kristeen Miss, MD;  Location: [place] NEURO ORS;  Service: Neurosurgery;  Laterality: N/A;  LUMBAR SPINAL CORD STIMULATOR REMOVAL    There were no vitals filed for this visit.      Subjective Assessment - 09/21/15 0948    Subjective Pt states that his back is a little sore today because he did alot of yard work and mowing over the weekend. He is trying not to let it keep him from doing what he needs to do.    Pertinent History HTN, DMII, pacemaker, L4-L5 fusion   Limitations Other (comment)   Patient Stated Goals get stronger and mobility.   Currently in Pain? Yes   Pain Score 7    Pain Location Back   Pain Orientation Mid;Lower   Pain Descriptors / Indicators Aching   Pain Type Chronic pain   Pain Radiating Towards none   Pain Onset More than a month ago   Pain Frequency Constant                         [facility] Adult PT Treatment/Exercise - 09/21/15 0001    Lumbar Exercises: Supine   Bent Knee Raise 10 reps  from 8" box    Bent Knee Raise Limitations cues to breath   Other Supine Lumbar Exercises lower trunk rotation x20 reps   Other Supine Lumbar Exercises alt bent knee lower with ab set 2x10 reps                PT Education - 09/21/15 0954    Education provided Yes   Education Details encouraged to continue pushing through the pain to an extent in order to avoid fear avoidant behavior; reviewed/updated HEP   Person(s) Educated Patient   Methods Explanation;Demonstration;Handout   Comprehension Verbalized understanding;Returned demonstration          PT Short Term Goals - 09/17/15 1049    PT  SHORT TERM GOAL #1   Title Pt will demo understanding and indep with updated HEP.   Time 2   Period Weeks   Status Achieved   PT SHORT TERM GOAL #2   Title Pt will correctly repeat and demo correct log rolling technique during sit to/from supine without cuing from therapist   Baseline pt not consistently performing without therapist cuing   Time 2   Period Weeks   Status On-going  PT SHORT TERM GOAL #3   Title Pt will demo improved pain report of no greater than 5/10 throughout the day to improve his participation in ADLs.   Time 4   Period Weeks   Status On-going           PT Long Term Goals - 09/17/15 1049    PT LONG TERM GOAL #1   Title Pt will demo improved LE strength to atleast 4/5 throughout BLE to improve his safety with sit to stand transitions.    Baseline atleast 4/5 via MMT of BLE   Time 8   Period Weeks   Status Achieved   PT LONG TERM GOAL #2   Title Pt will demo improved functional strength and endurance evident by an improvement in 5x sit to stand to less than 12 sec without use of UE   Baseline Met 09/08/15: Pt will demo improved functional strength and endurance evident by an improvement in 5x sit to stand to less than 30 sec without use of UE - 22 sec   Time 8   Period Weeks   Status Revised   PT LONG TERM GOAL #3   Title Pt will demonstrate improved TUG time in less than 14 sec and without and AD to decrease his risk of falls in the community.    Baseline 09/08/15 12.8 sec   Time 8   Period Weeks   PT LONG TERM GOAL #4   Title Pt will demonstrate improved balance evident by a Berg Balance Score improvement of atleast 4 points to decrease his risk of falls and injury.   Baseline initial: 41; 09/08/15: 48   Time 8   Period Weeks   Status On-going   PT LONG TERM GOAL #5   Title Pt will report decrease in pain to no greater than 3/10 to improve his tolerance of activity and ADLs.    Time 8   Period Weeks   Status On-going               Plan -  09/21/15 1207    Clinical Impression Statement Pt reporting increased soreness upon arrival secondary to heavy yard work over the weekend. He demonstrates good technique with deep abdominal activation during basic dynamic LE movements, however he had increased difficulty and breath holding with progressions to these exercises. Also progressed to more upright stability therex without report of pain. Pt was encouraged to continue with HEP adherence. Will continue with current POC.   Rehab Potential Fair   Clinical Impairments Affecting Rehab Potential chronicity of back pain   PT Frequency 2x / week   PT Duration 4 weeks   PT Treatment/Interventions ADLs/Self Care Home Management;Aquatic Therapy;Moist Heat;Patient/family education;Neuromuscular re-education;Balance training;Therapeutic exercise;Therapeutic activities;Functional mobility training;Stair training;Gait training;Manual techniques;Passive range of motion;Energy conservation   PT Next Visit Plan seated abdominal stability with UE punces, UE elevation with increased reps and speed as able to maintain good pelvis position; May progress to quadruped if pt able to tolerate.  Progress standing balance for SLS, as well as head turns and head nods.     PT Home Exercise Plan updated with seated UE punches and UE elevation with green TB resistance into rotation; alt bent knee lower   Consulted and Agree with Plan of Care Patient      Patient will benefit from skilled therapeutic intervention in order to improve the following deficits and impairments:  Abnormal gait, Decreased activity tolerance, Decreased balance, Decreased endurance, Decreased safety awareness, Decreased range of  motion, Decreased mobility, Decreased strength, Difficulty walking, Hypomobility, Impaired flexibility, Increased muscle spasms, Improper body mechanics, Postural dysfunction, Pain  Visit Diagnosis: Muscle weakness (generalized)  Unsteadiness on feet  Midline low back  pain, with sciatica presence unspecified  Other abnormalities of gait and mobility  Other symptoms and signs involving the musculoskeletal system     Problem List Patient Active Problem List   Diagnosis Date Noted  . Hyperlipidemia 04/02/2015  . Essential hypertension, benign 04/02/2015  . Other specified hypothyroidism 04/02/2015  . History of colonic polyps   . Encounter for screening colonoscopy 05/07/2014  . Psoriasis 10/15/2013  . Hypoxemia 09/05/2013  . SVT (supraventricular tachycardia) ([place]) 06/06/2013  . COPD (chronic obstructive pulmonary disease) ([place]) 06/06/2013  . Cardiac pacemaker in situ 06/06/2013  . Pacemaker 05/22/2013  . Cirrhosis ([place]) 01/22/2013  . Dyspepsia 01/22/2013  . Anemia 01/22/2013  . Atrial fibrillation ([place]) 11/21/2012  . Atrial flutter ([place]) 11/21/2012  . Chest pain 11/20/2012  .  Acute on chronic diastolic heart failure, predominantly Right heart failure 01/09/2012  . Syncope 01/06/2012  . Sinus arrest, 5 second pause on event monitor 01/06/2012  . Type II diabetes mellitus, uncontrolled ([place]) 01/06/2012  . Obesity 01/06/2012  . Smoker 01/06/2012  . Obstructive sleep apnea 01/06/2012  . DJD, on disability secondary to back pain, surg X 3 01/06/2012  . ABDOMINAL PAIN, RIGHT UPPER QUADRANT 06/14/2009  . ABDOMINAL PAIN 05/14/2009  . Hepatic cirrhosis ([place]) 01/13/2009  . GERD 12/04/2008  . GASTRITIS, ACUTE 12/04/2008  . HOARSENESS 12/04/2008  . CHEST PAIN 12/04/2008  . Dysphagia, pharyngoesophageal phase 12/04/2008    12:13 PM,09/21/2015 Elly Modena PT, DPT [facility] [place] [address] [place], [place], [address] Phone: [phone]   Fax:  [phone]  Name: Ralph [name] MRN: [id number] Date of Birth: 11-22-1950

## 2015-09-23 DIAGNOSIS — J9610 Chronic respiratory failure, unspecified whether with hypoxia or hypercapnia: Secondary | ICD-10-CM | POA: Diagnosis not present

## 2015-09-23 DIAGNOSIS — J449 Chronic obstructive pulmonary disease, unspecified: Secondary | ICD-10-CM | POA: Diagnosis not present

## 2015-09-23 DIAGNOSIS — I5030 Unspecified diastolic (congestive) heart failure: Secondary | ICD-10-CM | POA: Diagnosis not present

## 2015-09-23 DIAGNOSIS — I4891 Unspecified atrial fibrillation: Secondary | ICD-10-CM | POA: Diagnosis not present

## 2015-09-24 ENCOUNTER — Ambulatory Visit (HOSPITAL_COMMUNITY): Payer: Medicare HMO

## 2015-09-24 DIAGNOSIS — R2689 Other abnormalities of gait and mobility: Secondary | ICD-10-CM

## 2015-09-24 DIAGNOSIS — R2681 Unsteadiness on feet: Secondary | ICD-10-CM

## 2015-09-24 DIAGNOSIS — M6281 Muscle weakness (generalized): Secondary | ICD-10-CM

## 2015-09-24 DIAGNOSIS — M545 Low back pain: Secondary | ICD-10-CM

## 2015-09-24 DIAGNOSIS — R29898 Other symptoms and signs involving the musculoskeletal system: Secondary | ICD-10-CM

## 2015-09-24 NOTE — Therapy (Signed)
[place], [place], [address] Phone: [phone]   Fax:  [phone]  Physical Therapy Treatment  Patient Details  Name: Ralph [name] MRN: [id number] Date of Birth: 21-May-1950 Referring Provider: Narda Amber, MD  Encounter Date: 09/24/2015      PT End of Session - 09/24/15 1019    Visit Number 13   Number of Visits 16   Date for PT Re-Evaluation 10/06/15   Authorization Type AETNA Medicare (G-Codes done 8th visit)   Authorization Time Period 08/10/15 to 10/06/15   Authorization - Visit Number 13   Authorization - Number of Visits 18   PT Start Time 0957   PT Stop Time 1038   PT Time Calculation (min) 41 min   Activity Tolerance Patient tolerated treatment well   Behavior During Therapy [facility] for tasks assessed/performed      Past Medical History  Diagnosis Date  . Hypertension   . 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 Depressed   . Varicose vein     of esophagus  . CHF (congestive heart failure) ([place]) 01/06/2012  . Sinus pause 01/06/2012    5.2 seconds  . Anginal pain ([place])   . Sleep apnea     "don't wear mask" (01/06/2012)  . Emphysema   . Type II diabetes mellitus ([place])   . GERD (gastroesophageal reflux disease)   . H/O hiatal hernia   . Coughing up blood     "comes from my throat" (01/06/2012)  . Arthritis     "back; fingers" (01/06/2012)  . Chronic lower back pain   . Fatty liver disease, nonalcoholic   . CHB (complete heart block) ([place])   . Presence of permanent cardiac pacemaker 9/292013    [employer]  . Atrial flutter [facility])     s/p EPS +RF ablation of typical atrial flutter April 2015  . Pacemaker   . Stroke [facility])     pt states that he might have had a stroke not sure  . Pneumonia Aug 2016  . Hypothyroidism   . Headache   . DDD (degenerative disc disease), lumbar   . Cancer ([place])     skin cancer  . Difficult intubation     Eschmann stylet used in 2002 and 2007; "trouble waking  up afterwards" (01/06/2012)    Past Surgical History  Procedure Laterality Date  . Back surgery    . Spinal cord stimulator implant  2006  . Cholecystectomy  1993  . Nasal septum surgery  1992  . Tonsillectomy and adenoidectomy  1992  . Posterior fusion lumbar spine  1999    L4-5  . Lumbar disc surgery  1994; ~ 1995; ~ 1996  . Warthin's tumor excision  1990's    right  . Permanent pacemaker insertion  01/08/2012    CHB  . US echocardiography  12/28/2011    mild LVH,mild mitral annulara ca+,mild MR  . Nuclear stress test  10/19/2004    No ischemia  . Esophagogastroduodenoscopy  11/08/2004    ONG:EXBMWU esophageal erosions consistent with erosive reflux esophagitis/Areas of hemorrhage and nodularity of the fundal mucosa of uncertain significance, biopsied.  Small hiatal hernia, otherwise normal stomach  . Colonoscopy  11/08/2004    XLK:GMWNUU rectum, colon, TI.  Marland Kitchen Esophagogastroduodenoscopy  2010    Dr. Gala Romney: 3 columns Grade 1 varices, erosive esophagitis, HH, portal gastropathy, normal D1, D2  . Esophagogastroduodenoscopy (egd) with esophageal dilation N/A 02/14/2013  MKL:KJZPH 1 esophageal varices. Abnormal distal esophagus/status post biopsy after Maloney dilation. Portal gastropathy. Antral erosions-status post biopsy. path negative for H.pylori, benign path.  . Permanent pacemaker insertion N/A 01/09/2012    Procedure: PERMANENT PACEMAKER INSERTION;  Surgeon: Sanda Klein, MD;  Location: [facility] CATH LAB;  Service: Cardiovascular;  Laterality: N/A;  . Atrial flutter ablation N/A 07/10/2013    Procedure: ATRIAL FLUTTER ABLATION;  Surgeon: Evans Lance, MD;  Location: [facility] CATH LAB;  Service: Cardiovascular;  Laterality: N/A;  . Colonoscopy N/A 05/28/2014    Dr. Gala Romney: Redundant colon. single colonic polyp removed as described above. Tubular adenoma  . Esophagogastroduodenoscopy N/A 05/28/2014    Dr. Gala Romney: MIld erosive reflux esophagitis. Grade 1 esophageal varices. Patent esophagus. No  dilation performed. Hiatal hernia.   . Esophageal dilation N/A 05/28/2014    Procedure: ESOPHAGEAL DILATION;  Surgeon: Daneil Dolin, MD;  [facility];  Service: Endoscopy;  Laterality: N/A;  . Spinal cord stimulator removal N/A 01/27/2015    Procedure: LUMBAR SPINAL CORD STIMULATOR REMOVAL;  Surgeon: Kristeen Miss, MD;  Location: [place] NEURO ORS;  Service: Neurosurgery;  Laterality: N/A;  LUMBAR SPINAL CORD STIMULATOR REMOVAL    There were no vitals filed for this visit.      Subjective Assessment - 09/24/15 1001    Subjective Pt stated he is always in pain on lower back, pain scale 6/10.  Pt stated he feels his balance is improving, no longer scared of heights   Pertinent History HTN, DMII, pacemaker, L4-L5 fusion   Patient Stated Goals get stronger and mobility.   Currently in Pain? Yes   Pain Score 6    Pain Location Back   Pain Orientation Lower   Pain Descriptors / Indicators Aching;Sore   Pain Type Chronic pain   Pain Radiating Towards none   Pain Onset More than a month ago   Pain Frequency Constant   Aggravating Factors  bending over to pick up an object; twisting. Still with sudden pain - no motion associated with it   Pain Relieving Factors sitting - pain subsides, but does not go away   Effect of Pain on Daily Activities Pt states he feels like he is limited with everything because he is afraid that he is going to hurt             [facility] Adult PT Treatment/Exercise - 09/24/15 0001    Lumbar Exercises: Standing   Other Standing Lumbar Exercises standing abdominal stability with UE punces, UE elevation RTB   Other Standing Lumbar Exercises SLS with head turns 10x each directions   Lumbar Exercises: Supine   Bent Knee Raise 10 reps;5 seconds   Bent Knee Raise Limitations cues to breath   Other Supine Lumbar Exercises lower trunk rotation x20 reps   Other Supine Lumbar Exercises alt bent knee lower with ab set 2x10 reps             PT Short Term Goals  - 09/17/15 1049    PT SHORT TERM GOAL #1   Title Pt will demo understanding and indep with updated HEP.   Time 2   Period Weeks   Status Achieved   PT SHORT TERM GOAL #2   Title Pt will correctly repeat and demo correct log rolling technique during sit to/from supine without cuing from therapist   Baseline pt not consistently performing without therapist cuing   Time 2   Period Weeks   Status On-going   PT SHORT TERM GOAL #  3   Title Pt will demo improved pain report of no greater than 5/10 throughout the day to improve his participation in ADLs.   Time 4   Period Weeks   Status On-going           PT Long Term Goals - 09/17/15 1049    PT LONG TERM GOAL #1   Title Pt will demo improved LE strength to atleast 4/5 throughout BLE to improve his safety with sit to stand transitions.    Baseline atleast 4/5 via MMT of BLE   Time 8   Period Weeks   Status Achieved   PT LONG TERM GOAL #2   Title Pt will demo improved functional strength and endurance evident by an improvement in 5x sit to stand to less than 12 sec without use of UE   Baseline Met 09/08/15: Pt will demo improved functional strength and endurance evident by an improvement in 5x sit to stand to less than 30 sec without use of UE - 22 sec   Time 8   Period Weeks   Status Revised   PT LONG TERM GOAL #3   Title Pt will demonstrate improved TUG time in less than 14 sec and without and AD to decrease his risk of falls in the community.    Baseline 09/08/15 12.8 sec   Time 8   Period Weeks   PT LONG TERM GOAL #4   Title Pt will demonstrate improved balance evident by a Berg Balance Score improvement of atleast 4 points to decrease his risk of falls and injury.   Baseline initial: 41; 09/08/15: 48   Time 8   Period Weeks   Status On-going   PT LONG TERM GOAL #5   Title Pt will report decrease in pain to no greater than 3/10 to improve his tolerance of activity and ADLs.    Time 8   Period Weeks   Status On-going                Plan - 09/24/15 1039    Clinical Impression Statement Session focus on improving abdominal strengthening to improve lumbar stability for pain control.  Pt demonstrates good technique with deep abdominal activation though continues to require cueing for breathing with exercises.  Progressed to standing with abdominal stability exercises today with resistance with minimal cueing to improving good pelvis positions.  Added SLS with head turns to improve stabilty.  No reports of increased pain through session.     Rehab Potential Fair   Clinical Impairments Affecting Rehab Potential chronicity of back pain   PT Frequency 2x / week   PT Duration 4 weeks   PT Treatment/Interventions ADLs/Self Care Home Management;Aquatic Therapy;Moist Heat;Patient/family education;Neuromuscular re-education;Balance training;Therapeutic exercise;Therapeutic activities;Functional mobility training;Stair training;Gait training;Manual techniques;Passive range of motion;Energy conservation   PT Next Visit Plan Continue wiht abdominal stability exercises and add dynamic surface with SLS and head turns.  Begin quadruped for lumbar stability if able to tolerate next session.        Patient will benefit from skilled therapeutic intervention in order to improve the following deficits and impairments:  Abnormal gait, Decreased activity tolerance, Decreased balance, Decreased endurance, Decreased safety awareness, Decreased range of motion, Decreased mobility, Decreased strength, Difficulty walking, Hypomobility, Impaired flexibility, Increased muscle spasms, Improper body mechanics, Postural dysfunction, Pain  Visit Diagnosis: Muscle weakness (generalized)  Unsteadiness on feet  Midline low back pain, with sciatica presence unspecified  Other abnormalities of gait and mobility  Other symptoms and signs  involving the musculoskeletal system     Problem List Patient Active Problem List   Diagnosis Date  Noted  . Hyperlipidemia 04/02/2015  . Essential hypertension, benign 04/02/2015  . Other specified hypothyroidism 04/02/2015  . History of colonic polyps   . Encounter for screening colonoscopy 05/07/2014  . Psoriasis 10/15/2013  . Hypoxemia 09/05/2013  . SVT (supraventricular tachycardia) ([place]) 06/06/2013  . COPD (chronic obstructive pulmonary disease) ([place]) 06/06/2013  . Cardiac pacemaker in situ 06/06/2013  . Pacemaker 05/22/2013  . Cirrhosis ([place]) 01/22/2013  . Dyspepsia 01/22/2013  . Anemia 01/22/2013  . Atrial fibrillation ([place]) 11/21/2012  . Atrial flutter ([place]) 11/21/2012  . Chest pain 11/20/2012  .  Acute on chronic diastolic heart failure, predominantly Right heart failure 01/09/2012  . Syncope 01/06/2012  . Sinus arrest, 5 second pause on event monitor 01/06/2012  . Type II diabetes mellitus, uncontrolled ([place]) 01/06/2012  . Obesity 01/06/2012  . Smoker 01/06/2012  . Obstructive sleep apnea 01/06/2012  . DJD, on disability secondary to back pain, surg X 3 01/06/2012  . ABDOMINAL PAIN, RIGHT UPPER QUADRANT 06/14/2009  . ABDOMINAL PAIN 05/14/2009  . Hepatic cirrhosis ([place]) 01/13/2009  . GERD 12/04/2008  . GASTRITIS, ACUTE 12/04/2008  . HOARSENESS 12/04/2008  . CHEST PAIN 12/04/2008  . Dysphagia, pharyngoesophageal phase 12/04/2008   Ihor Austin, LPTA; [place]  Aldona Lento 09/24/2015, 10:53 AM  [place], [place], [address] Phone: [phone]   Fax:  [phone]  Name: EDDIE PAYETTE MRN: [id number] Date of Birth: Aug 21, 1950

## 2015-09-25 DIAGNOSIS — R69 Illness, unspecified: Secondary | ICD-10-CM | POA: Diagnosis not present

## 2015-09-28 ENCOUNTER — Ambulatory Visit (HOSPITAL_COMMUNITY): Payer: Medicare HMO | Admitting: Physical Therapy

## 2015-09-28 ENCOUNTER — Other Ambulatory Visit: Payer: Self-pay | Admitting: "Endocrinology

## 2015-09-28 DIAGNOSIS — E118 Type 2 diabetes mellitus with unspecified complications: Secondary | ICD-10-CM | POA: Diagnosis not present

## 2015-09-28 DIAGNOSIS — R2681 Unsteadiness on feet: Secondary | ICD-10-CM

## 2015-09-28 DIAGNOSIS — M6281 Muscle weakness (generalized): Secondary | ICD-10-CM | POA: Diagnosis not present

## 2015-09-28 DIAGNOSIS — R2689 Other abnormalities of gait and mobility: Secondary | ICD-10-CM

## 2015-09-28 DIAGNOSIS — M545 Low back pain: Secondary | ICD-10-CM

## 2015-09-28 DIAGNOSIS — E038 Other specified hypothyroidism: Secondary | ICD-10-CM | POA: Diagnosis not present

## 2015-09-28 DIAGNOSIS — Z794 Long term (current) use of insulin: Secondary | ICD-10-CM | POA: Diagnosis not present

## 2015-09-28 DIAGNOSIS — R29898 Other symptoms and signs involving the musculoskeletal system: Secondary | ICD-10-CM

## 2015-09-28 DIAGNOSIS — E1165 Type 2 diabetes mellitus with hyperglycemia: Secondary | ICD-10-CM | POA: Diagnosis not present

## 2015-09-28 LAB — BASIC METABOLIC PANEL
BUN: 24 mg/dL (ref 7–25)
CALCIUM: 9.1 mg/dL (ref 8.6–10.3)
CO2: 27 mmol/L (ref 20–31)
CREATININE: 0.73 mg/dL (ref 0.70–1.25)
Chloride: 101 mmol/L (ref 98–110)
Glucose, Bld: 96 mg/dL (ref 65–99)
Potassium: 5.2 mmol/L (ref 3.5–5.3)
Sodium: 138 mmol/L (ref 135–146)

## 2015-09-28 LAB — TSH: TSH: 0.76 mIU/L (ref 0.40–4.50)

## 2015-09-28 LAB — HEMOGLOBIN A1C
HEMOGLOBIN A1C: 5.9 % — AB (ref <5.7)
Mean Plasma Glucose: 123 mg/dL

## 2015-09-28 LAB — T4, FREE: FREE T4: 1.4 ng/dL (ref 0.8–1.8)

## 2015-09-28 NOTE — Therapy (Signed)
[place], [place], [address] Phone: [phone]   Fax:  [phone]  Physical Therapy Treatment  Patient Details  Name: Ralph [name] MRN: [id number] Date of Birth: 1950/09/22 Referring Provider: Narda Amber, MD  Encounter Date: 09/28/2015      PT End of Session - 09/28/15 1028    Visit Number 14   Number of Visits 16   Date for PT Re-Evaluation 10/06/15   Authorization Type AETNA Medicare (G-Codes done 8th visit)   Authorization Time Period 08/10/15 to 10/06/15   Authorization - Visit Number 13   Authorization - Number of Visits 18   PT Start Time 0946   PT Stop Time 1026   PT Time Calculation (min) 40 min   Equipment Utilized During Treatment Gait belt   Activity Tolerance Patient tolerated treatment well   Behavior During Therapy [facility] for tasks assessed/performed      Past Medical History  Diagnosis Date  . Hypertension   . 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 Depressed   . Varicose vein     of esophagus  . CHF (congestive heart failure) ([place]) 01/06/2012  . Sinus pause 01/06/2012    5.2 seconds  . Anginal pain ([place])   . Sleep apnea     "don't wear mask" (01/06/2012)  . Emphysema   . Type II diabetes mellitus ([place])   . GERD (gastroesophageal reflux disease)   . H/O hiatal hernia   . Coughing up blood     "comes from my throat" (01/06/2012)  . Arthritis     "back; fingers" (01/06/2012)  . Chronic lower back pain   . Fatty liver disease, nonalcoholic   . CHB (complete heart block) ([place])   . Presence of permanent cardiac pacemaker 9/292013    [employer]  . Atrial flutter [facility])     s/p EPS +RF ablation of typical atrial flutter April 2015  . Pacemaker   . Stroke [facility])     pt states that he might have had a stroke not sure  . Pneumonia Aug 2016  . Hypothyroidism   . Headache   . DDD (degenerative disc disease), lumbar   . Cancer ([place])     skin cancer  . Difficult intubation    Eschmann stylet used in 2002 and 2007; "trouble waking up afterwards" (01/06/2012)    Past Surgical History  Procedure Laterality Date  . Back surgery    . Spinal cord stimulator implant  2006  . Cholecystectomy  1993  . Nasal septum surgery  1992  . Tonsillectomy and adenoidectomy  1992  . Posterior fusion lumbar spine  1999    L4-5  . Lumbar disc surgery  1994; ~ 1995; ~ 1996  . Warthin's tumor excision  1990's    right  . Permanent pacemaker insertion  01/08/2012    CHB  . US echocardiography  12/28/2011    mild LVH,mild mitral annulara ca+,mild MR  . Nuclear stress test  10/19/2004    No ischemia  . Esophagogastroduodenoscopy  11/08/2004    NAT:FTDDUK esophageal erosions consistent with erosive reflux esophagitis/Areas of hemorrhage and nodularity of the fundal mucosa of uncertain significance, biopsied.  Small hiatal hernia, otherwise normal stomach  . Colonoscopy  11/08/2004    GUR:KYHCWC rectum, colon, TI.  Marland Kitchen Esophagogastroduodenoscopy  2010    Dr. Gala Romney: 3 columns Grade 1 varices, erosive esophagitis, HH, portal gastropathy, normal D1, D2  . Esophagogastroduodenoscopy (egd) with  esophageal dilation N/A 02/14/2013    UXN:ATFTD 1 esophageal varices. Abnormal distal esophagus/status post biopsy after Maloney dilation. Portal gastropathy. Antral erosions-status post biopsy. path negative for H.pylori, benign path.  . Permanent pacemaker insertion N/A 01/09/2012    Procedure: PERMANENT PACEMAKER INSERTION;  Surgeon: Sanda Klein, MD;  Location: [facility] CATH LAB;  Service: Cardiovascular;  Laterality: N/A;  . Atrial flutter ablation N/A 07/10/2013    Procedure: ATRIAL FLUTTER ABLATION;  Surgeon: Evans Lance, MD;  Location: [facility] CATH LAB;  Service: Cardiovascular;  Laterality: N/A;  . Colonoscopy N/A 05/28/2014    Dr. Gala Romney: Redundant colon. single colonic polyp removed as described above. Tubular adenoma  . Esophagogastroduodenoscopy N/A 05/28/2014    Dr. Gala Romney: MIld erosive reflux  esophagitis. Grade 1 esophageal varices. Patent esophagus. No dilation performed. Hiatal hernia.   . Esophageal dilation N/A 05/28/2014    Procedure: ESOPHAGEAL DILATION;  Surgeon: Daneil Dolin, MD;  [facility];  Service: Endoscopy;  Laterality: N/A;  . Spinal cord stimulator removal N/A 01/27/2015    Procedure: LUMBAR SPINAL CORD STIMULATOR REMOVAL;  Surgeon: Kristeen Miss, MD;  Location: [place] NEURO ORS;  Service: Neurosurgery;  Laterality: N/A;  LUMBAR SPINAL CORD STIMULATOR REMOVAL    There were no vitals filed for this visit.      Subjective Assessment - 09/28/15 0949    Subjective Pt states he was able to get in the pool and exercise ~2 hours. States his back hurt him but its what he "expected". He does feel he is making improvements. He feels his back is about the same. It is hurting around a 5-6/10 pain.    Patient Stated Goals get stronger and mobility.   Currently in Pain? Yes   Pain Score 6    Pain Location Back   Pain Orientation Lower   Pain Descriptors / Indicators Aching   Pain Type Chronic pain                         [facility] Adult PT Treatment/Exercise - 09/28/15 0001    Lumbar Exercises: Standing   Other Standing Lumbar Exercises tandem UE punches while resisting trunk rotation x15 each side with each LE forward  blue TB   Lumbar Exercises: Supine   Other Supine Lumbar Exercises alt bent knee lowering x20   Other Supine Lumbar Exercises ab set with SLR x15 each   Knee/Hip Exercises: Standing   Hip Flexion Right;2 sets;15 reps;Knee bent  1 UE support, 5#             Balance Exercises - 09/28/15 1023    Balance Exercises: Standing   SLS Solid surface;3 reps;Eyes open;Foam/compliant surface  RLE max 14 sec on solid surface, no more than 5 sec on foam           PT Education - 09/28/15 1027    Education provided Yes   Education Details discussed continued HEP adherence, possible d/c next visit after reviewing advanced HEP    Person(s) Educated Patient   Methods Explanation;Demonstration   Comprehension Verbalized understanding;Returned demonstration          PT Short Term Goals - 09/17/15 1049    PT SHORT TERM GOAL #1   Title Pt will demo understanding and indep with updated HEP.   Time 2   Period Weeks   Status Achieved   PT SHORT TERM GOAL #2   Title Pt will correctly repeat and demo correct log rolling technique during sit to/from  supine without cuing from therapist   Baseline pt not consistently performing without therapist cuing   Time 2   Period Weeks   Status On-going   PT SHORT TERM GOAL #3   Title Pt will demo improved pain report of no greater than 5/10 throughout the day to improve his participation in ADLs.   Time 4   Period Weeks   Status On-going           PT Long Term Goals - 09/17/15 1049    PT LONG TERM GOAL #1   Title Pt will demo improved LE strength to atleast 4/5 throughout BLE to improve his safety with sit to stand transitions.    Baseline atleast 4/5 via MMT of BLE   Time 8   Period Weeks   Status Achieved   PT LONG TERM GOAL #2   Title Pt will demo improved functional strength and endurance evident by an improvement in 5x sit to stand to less than 12 sec without use of UE   Baseline Met 09/08/15: Pt will demo improved functional strength and endurance evident by an improvement in 5x sit to stand to less than 30 sec without use of UE - 22 sec   Time 8   Period Weeks   Status Revised   PT LONG TERM GOAL #3   Title Pt will demonstrate improved TUG time in less than 14 sec and without and AD to decrease his risk of falls in the community.    Baseline 09/08/15 12.8 sec   Time 8   Period Weeks   PT LONG TERM GOAL #4   Title Pt will demonstrate improved balance evident by a Berg Balance Score improvement of atleast 4 points to decrease his risk of falls and injury.   Baseline initial: 41; 09/08/15: 48   Time 8   Period Weeks   Status On-going   PT LONG TERM GOAL #5    Title Pt will report decrease in pain to no greater than 3/10 to improve his tolerance of activity and ADLs.    Time 8   Period Weeks   Status On-going               Plan - 09/28/15 1029    Clinical Impression Statement Today's session focused on therex and activity to improve deep abdominal stability/endurance with dynamic and standing acitivity. Pt demonstrating improved SLS on his RLE up to ~14 sec without LOB. Discussed possible d/c at his next visit as he is happy with his progress and is willing to conitnue with HEP adherence at home on his own.    Rehab Potential Fair   Clinical Impairments Affecting Rehab Potential chronicity of back pain   PT Frequency 2x / week   PT Duration 4 weeks   PT Treatment/Interventions ADLs/Self Care Home Management;Aquatic Therapy;Moist Heat;Patient/family education;Neuromuscular re-education;Balance training;Therapeutic exercise;Therapeutic activities;Functional mobility training;Stair training;Gait training;Manual techniques;Passive range of motion;Energy conservation   PT Next Visit Plan d/c with advanced HEP   PT Home Exercise Plan no updates this visit   Recommended Other Services none   Consulted and Agree with Plan of Care Patient      Patient will benefit from skilled therapeutic intervention in order to improve the following deficits and impairments:  Abnormal gait, Decreased activity tolerance, Decreased balance, Decreased endurance, Decreased safety awareness, Decreased range of motion, Decreased mobility, Decreased strength, Difficulty walking, Hypomobility, Impaired flexibility, Increased muscle spasms, Improper body mechanics, Postural dysfunction, Pain  Visit Diagnosis: Muscle weakness (generalized)  Unsteadiness on feet  Midline low back pain, with sciatica presence unspecified  Other abnormalities of gait and mobility  Other symptoms and signs involving the musculoskeletal system     Problem List Patient Active Problem  List   Diagnosis Date Noted  . Hyperlipidemia 04/02/2015  . Essential hypertension, benign 04/02/2015  . Other specified hypothyroidism 04/02/2015  . History of colonic polyps   . Encounter for screening colonoscopy 05/07/2014  . Psoriasis 10/15/2013  . Hypoxemia 09/05/2013  . SVT (supraventricular tachycardia) ([place]) 06/06/2013  . COPD (chronic obstructive pulmonary disease) ([place]) 06/06/2013  . Cardiac pacemaker in situ 06/06/2013  . Pacemaker 05/22/2013  . Cirrhosis ([place]) 01/22/2013  . Dyspepsia 01/22/2013  . Anemia 01/22/2013  . Atrial fibrillation ([place]) 11/21/2012  . Atrial flutter ([place]) 11/21/2012  . Chest pain 11/20/2012  .  Acute on chronic diastolic heart failure, predominantly Right heart failure 01/09/2012  . Syncope 01/06/2012  . Sinus arrest, 5 second pause on event monitor 01/06/2012  . Type II diabetes mellitus, uncontrolled ([place]) 01/06/2012  . Obesity 01/06/2012  . Smoker 01/06/2012  . Obstructive sleep apnea 01/06/2012  . DJD, on disability secondary to back pain, surg X 3 01/06/2012  . ABDOMINAL PAIN, RIGHT UPPER QUADRANT 06/14/2009  . ABDOMINAL PAIN 05/14/2009  . Hepatic cirrhosis ([place]) 01/13/2009  . GERD 12/04/2008  . GASTRITIS, ACUTE 12/04/2008  . HOARSENESS 12/04/2008  . CHEST PAIN 12/04/2008  . Dysphagia, pharyngoesophageal phase 12/04/2008   12:14 PM,09/28/2015 Elly Modena PT, DPT [facility] [place] [address] [place], [place], [address] Phone: [phone]   Fax:  [phone]  Name: Ralph [name] MRN: [id number] Date of Birth: November 10, 1950

## 2015-09-30 ENCOUNTER — Ambulatory Visit (INDEPENDENT_AMBULATORY_CARE_PROVIDER_SITE_OTHER): Payer: Medicare HMO | Admitting: Cardiology

## 2015-09-30 ENCOUNTER — Encounter: Payer: Self-pay | Admitting: Cardiology

## 2015-09-30 VITALS — BP 118/76 | HR 60 | Ht 70.0 in | Wt 218.0 lb

## 2015-09-30 DIAGNOSIS — I10 Essential (primary) hypertension: Secondary | ICD-10-CM | POA: Diagnosis not present

## 2015-09-30 DIAGNOSIS — E785 Hyperlipidemia, unspecified: Secondary | ICD-10-CM

## 2015-09-30 DIAGNOSIS — I442 Atrioventricular block, complete: Secondary | ICD-10-CM

## 2015-09-30 DIAGNOSIS — R42 Dizziness and giddiness: Secondary | ICD-10-CM

## 2015-09-30 DIAGNOSIS — I4891 Unspecified atrial fibrillation: Secondary | ICD-10-CM | POA: Diagnosis not present

## 2015-09-30 NOTE — Patient Instructions (Addendum)
Your physician wants you to follow-up in: 6 months You will receive a reminder letter in the mail two months in advance. If you don't receive a letter, please call our office to schedule the follow-up appointment.       Get FASTING lipid profile    Your physician recommends that you continue on your current medications as directed. Please refer to the Current Medication list given to you today.     Thank you for choosing [facility] !

## 2015-09-30 NOTE — Progress Notes (Signed)
Clinical Summary Ralph [name] is a 65 y.o.male  seen today for follow up of the following medical problems.  1. Bradycardia/sinus arrest  - St Jude dual chamber pacemaker pacemaker implanted Sept 2013 ([place] [facility] [employer]).  - last check check Jan 2017, normal function. Occasional mild orthostatic dizziness but no other symptoms.     2. Afib/aflutter - previous side effects on multaq - seen by EP 06/28/13, started on flecanide. Continued to have symptoms. Had RF ablation of flutter by Dr Lovena Le 07/10/13. 10/2014 device check did show some AF burden - on nadolol for history of palpitations as well as esoph varices  - no recent palpitations - compliant with meds. No recent bleeding troubles on xarelto.    3. HTN  - compliant w/ meds  - checks regularly at home, typically 100-110/60-70s  4. HL  - compliant with statin - no recent panel in our system  5. NASH cirrhosis  - followed by GI  - history of grade I varices, no history of bleeding on anticoag    6. COPD  - compliant with inhalers and home oxygen. - followed by pulmonary  7. Orthostatic dizziness - symptoms significantly improved  since last visit.    8. DM2 - followed Dr Dorris Fetch Past Medical History  Diagnosis Date  . Hypertension   . Cirrhosis ([place])     NASH-Hep A and B immune  . Depressed   . Varicose vein     of esophagus  . CHF (congestive heart failure) ([place]) 01/06/2012  . Sinus pause 01/06/2012    5.2 seconds  . Anginal pain ([place])   . Sleep apnea     "don't wear mask" (01/06/2012)  . Emphysema   . Type II diabetes mellitus ([place])   . GERD (gastroesophageal reflux disease)   . H/O hiatal hernia   . Coughing up blood     "comes from my throat" (01/06/2012)  . Arthritis     "back; fingers" (01/06/2012)  . Chronic lower back pain   . Fatty liver disease, nonalcoholic   . CHB (complete heart block) ([place])   . Presence of permanent cardiac pacemaker 9/292013    St.Jude  . Atrial  flutter [facility])     s/p EPS +RF ablation of typical atrial flutter April 2015  . Pacemaker   . Stroke [facility])     pt states that he might have had a stroke not sure  . Pneumonia Aug 2016  . Hypothyroidism   . Headache   . DDD (degenerative disc disease), lumbar   . Cancer ([place])     skin cancer  . Difficult intubation     Eschmann stylet used in 2002 and 2007; "trouble waking up afterwards" (01/06/2012)     Allergies  Allergen Reactions  . Nitroglycerin Hives, Swelling and Rash     Current Outpatient Prescriptions  Medication Sig Dispense Refill  . ANORO ELLIPTA 62.5-25 MCG/INH AEPB INHALE 1 PUFF ONCE DAILY 180 each 3  . buPROPion (WELLBUTRIN SR) 150 MG 12 hr tablet Take 150 mg by mouth 2 (two) times daily.    . Cholecalciferol (VITAMIN D PO) Take 1 tablet by mouth daily.    . Cyanocobalamin (VITAMIN B 12 PO) Take 1 tablet by mouth daily.    . diazepam (VALIUM) 10 MG tablet Take 10 mg by mouth every 6 (six) hours as needed for anxiety.    Marland Kitchen diltiazem (CARDIZEM CD) 180 MG 24 hr capsule Take 1 capsule (180 mg  total) by mouth daily. 90 capsule 3  . escitalopram (LEXAPRO) 10 MG tablet Take 10 mg by mouth daily.    . flecainide (TAMBOCOR) 100 MG tablet Take 1 tablet (100 mg total) by mouth 2 (two) times daily. 180 tablet 1  . furosemide (LASIX) 20 MG tablet Take 2 tablets (40 mg total) by mouth daily as needed (swelling). 60 tablet 6  . INVOKANA 300 MG TABS tablet TAKE 1 TABLET EVERY DAY 90 tablet 0  . LEVEMIR 100 UNIT/ML injection INJECT 70 UNITS SUBCUTANEOUS AT BEDTIME 70 mL 2  . levothyroxine (SYNTHROID, LEVOTHROID) 112 MCG tablet TAKE 1 TABLET (112 MCG TOTAL) BY MOUTH DAILY BEFORE BREAKFAST. 90 tablet 0  . metFORMIN (GLUCOPHAGE) 1000 MG tablet TAKE 1 TABLET BY MOUTH TWICE A DAY 180 tablet 0  . Multiple Vitamin (MULTIVITAMIN) tablet Take 1 tablet by mouth daily.    . nadolol (CORGARD) 40 MG tablet Take 1 tablet (40 mg) in the morning and 1/2 (20 mg) tablet by mouth every evening 135  tablet 3  . NOVOLOG FLEXPEN 100 UNIT/ML FlexPen INJECT 10 - 16 UNITS SUBCUTANEOUSLY 3 TIMES A DAY BEFORE MEALS (Patient taking differently: INJECT 5-10  UNITS SUBCUTANEOUSLY 3 TIMES A DAY BEFORE MEALS) 15 mL 2  . ondansetron (ZOFRAN) 4 MG tablet Take 1 tablet (4 mg total) by mouth every 8 (eight) hours as needed for nausea or vomiting. 40 tablet 0  . OXYGEN Inhale 2 L into the lungs at bedtime.    . pantoprazole (PROTONIX) 40 MG tablet Take 1 tablet (40 mg total) by mouth daily. 90 tablet 5  . simvastatin (ZOCOR) 20 MG tablet TAKE 1 TABLET (20 MG TOTAL) BY MOUTH EVERY EVENING. 90 tablet 3  . spironolactone (ALDACTONE) 50 MG tablet TAKE 1 TABLET (50 MG TOTAL) BY MOUTH 2 (TWO) TIMES DAILY. 180 tablet 3  . traMADol (ULTRAM) 50 MG tablet Take 50 mg by mouth every 6 (six) hours as needed.    Alveda Reasons 20 MG TABS tablet TAKE 1 TABLET BY MOUTH EVERY DAY WITH DINNER 90 tablet 3   No current facility-administered medications for this visit.     Past Surgical History  Procedure Laterality Date  . Back surgery    . Spinal cord stimulator implant  2006  . Cholecystectomy  1993  . Nasal septum surgery  1992  . Tonsillectomy and adenoidectomy  1992  . Posterior fusion lumbar spine  1999    L4-5  . Lumbar disc surgery  1994; ~ 1995; ~ 1996  . Warthin's tumor excision  1990's    right  . Permanent pacemaker insertion  01/08/2012    CHB  . US echocardiography  12/28/2011    mild LVH,mild mitral annulara ca+,mild MR  . Nuclear stress test  10/19/2004    No ischemia  . Esophagogastroduodenoscopy  11/08/2004    [id number] esophageal erosions consistent with erosive reflux esophagitis/Areas of hemorrhage and nodularity of the fundal mucosa of uncertain significance, biopsied.  Small hiatal hernia, otherwise normal stomach  . Colonoscopy  11/08/2004    [id number] rectum, colon, TI.  Marland Kitchen Esophagogastroduodenoscopy  2010    Dr. Gala Romney: 3 columns Grade 1 varices, erosive esophagitis, HH, portal gastropathy,  normal D1, D2  . Esophagogastroduodenoscopy (egd) with esophageal dilation N/A 02/14/2013    [id number] esophageal varices. Abnormal distal esophagus/status post biopsy after Maloney dilation. Portal gastropathy. Antral erosions-status post biopsy. path negative for H.pylori, benign path.  . Permanent pacemaker insertion N/A 01/09/2012    Procedure: PERMANENT PACEMAKER  INSERTION;  Surgeon: Sanda Klein, MD;  Location: [facility] CATH LAB;  Service: Cardiovascular;  Laterality: N/A;  . Atrial flutter ablation N/A 07/10/2013    Procedure: ATRIAL FLUTTER ABLATION;  Surgeon: Evans Lance, MD;  Location: [facility] CATH LAB;  Service: Cardiovascular;  Laterality: N/A;  . Colonoscopy N/A 05/28/2014    Dr. Gala Romney: Redundant colon. single colonic polyp removed as described above. Tubular adenoma  . Esophagogastroduodenoscopy N/A 05/28/2014    Dr. Gala Romney: MIld erosive reflux esophagitis. Grade 1 esophageal varices. Patent esophagus. No dilation performed. Hiatal hernia.   . Esophageal dilation N/A 05/28/2014    Procedure: ESOPHAGEAL DILATION;  Surgeon: Daneil Dolin, MD;  Location: [facility] ENDO SU[facility];  Service: Endoscopy;  Laterality: N/A;  . Spinal cord stimulator removal N/A 01/27/2015    Procedure: LUMBAR SPINAL CORD STIMULATOR REMOVAL;  Surgeon: Kristeen Miss, MD;  Location: [place] NEURO ORS;  Service: Neurosurgery;  Laterality: N/A;  LUMBAR SPINAL CORD STIMULATOR REMOVAL     Allergies  Allergen Reactions  . Nitroglycerin Hives, Swelling and Rash      Family History  Problem Relation Age of Onset  . Stroke Brother   . Cancer Mother     Deceased, 17  . Arrhythmia Father   . Other Father     Deceased 66  . Ovarian cancer Mother   . Stroke Brother   . Stroke Sister   . Crohn's disease Daughter      Social History Ralph [name] reports that he quit smoking about 5 months ago. His smoking use included Cigarettes. He started smoking about 47 years ago. He has a 22.5 pack-year smoking history. He has never used  smokeless tobacco. Ralph [name] reports that he does not drink alcohol.   Review of Systems CONSTITUTIONAL: No weight loss, fever, chills, weakness or fatigue.  HEENT: Eyes: No visual loss, blurred vision, double vision or yellow sclerae.No hearing loss, sneezing, congestion, runny nose or sore throat.  SKIN: No rash or itching.  CARDIOVASCULAR: per HPI RESPIRATORY: No shortness of breath, cough or sputum.  GASTROINTESTINAL: No anorexia, nausea, vomiting or diarrhea. No abdominal pain or blood.  GENITOURINARY: No burning on urination, no polyuria NEUROLOGICAL: No headache, dizziness, syncope, paralysis, ataxia, numbness or tingling in the extremities. No change in bowel or bladder control.  MUSCULOSKELETAL: No muscle, back pain, joint pain or stiffness.  LYMPHATICS: No enlarged nodes. No history of splenectomy.  PSYCHIATRIC: No history of depression or anxiety.  ENDOCRINOLOGIC: No reports of sweating, cold or heat intolerance. No polyuria or polydipsia.  Marland Kitchen   Physical Examination Filed Vitals:   09/30/15 1137  BP: 118/76  Pulse: 60   Filed Vitals:   09/30/15 1137  Height: 5\' 10"  (1.778 m)  Weight: 218 lb (98.884 kg)    Gen: resting comfortably, no acute distress HEENT: no scleral icterus, pupils equal round and reactive, no palptable cervical adenopathy,  CV: RRR, no m/r/g, no jvd Resp: Clear to auscultation bilaterally GI: abdomen is soft, non-tender, non-distended, normal bowel sounds, no hepatosplenomegaly MSK: extremities are warm, no edema.  Skin: warm, no rash Neuro:  no focal deficits Psych: appropriate affect   Diagnostic Studies Jan 2014 Myoview: no ischemia   11/2012 Echo: LVEF 60-65%, mild LVH, moderate basal septal hypertrophy, mild LAE    Assessment and Plan   1. Afib/Aflutter - no current symptoms since recent aflutter ablation. Pacemaker check did show some AF, we will continue flecanide and anticoagulation.  - CHADS2Vasc score is 2, continue  naticoagulatio   2. HTN:  -  at goal,he will continue current meds   3. HL  - followed by PCP. Reports PCP is watching his LFTs closely due to an increase and history of NASH, will defer management to PCP  - repeat lipid panel  4. Sinus arrest  - normal pacemaker check on last visit, no recent symptoms - continue to monitor  5. COPD - per pulmonary  6. Orthostatic dizziness - symptoms improved after cutting back dilt, continue to monitor   F/u 6 months     Arnoldo Lenis, [name]

## 2015-10-01 ENCOUNTER — Ambulatory Visit (HOSPITAL_COMMUNITY): Payer: Medicare HMO | Admitting: Physical Therapy

## 2015-10-01 DIAGNOSIS — R29898 Other symptoms and signs involving the musculoskeletal system: Secondary | ICD-10-CM

## 2015-10-01 DIAGNOSIS — R2689 Other abnormalities of gait and mobility: Secondary | ICD-10-CM

## 2015-10-01 DIAGNOSIS — M545 Low back pain: Secondary | ICD-10-CM

## 2015-10-01 DIAGNOSIS — M6281 Muscle weakness (generalized): Secondary | ICD-10-CM | POA: Diagnosis not present

## 2015-10-01 DIAGNOSIS — R2681 Unsteadiness on feet: Secondary | ICD-10-CM

## 2015-10-01 NOTE — Therapy (Signed)
[place], [place], [address] Phone: [phone]   Fax:  [phone]  Physical Therapy Treatment/Discharge  Patient Details  Name: Ralph [name] MRN: [id number] Date of Birth: 07/08/1950 Referring Provider: Narda Amber, MD  Encounter Date: 10/01/2015      PT End of Session - 10/01/15 1240    Visit Number 15   Number of Visits 16   Date for PT Re-Evaluation 10/06/15   Authorization Type [employer] (G-Codes done 8th visit)   Authorization Time Period 08/10/15 to 10/06/15   Authorization - Visit Number 15   Authorization - Number of Visits 18   PT Start Time 0950   PT Stop Time 1030   PT Time Calculation (min) 40 min   Equipment Utilized During Treatment Gait belt   Activity Tolerance Patient tolerated treatment well   Behavior During Therapy [facility] for tasks assessed/performed      Past Medical History  Diagnosis Date  . Hypertension   . 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 Depressed   . Varicose vein     of esophagus  . CHF (congestive heart failure) ([place]) 01/06/2012  . Sinus pause 01/06/2012    5.2 seconds  . Anginal pain ([place])   . Sleep apnea     "don't wear mask" (01/06/2012)  . Emphysema   . Type II diabetes mellitus ([place])   . GERD (gastroesophageal reflux disease)   . H/O hiatal hernia   . Coughing up blood     "comes from my throat" (01/06/2012)  . Arthritis     "back; fingers" (01/06/2012)  . Chronic lower back pain   . Fatty liver disease, nonalcoholic   . CHB (complete heart block) ([place])   . Presence of permanent cardiac pacemaker 9/292013    [employer]  . Atrial flutter [facility])     s/p EPS +RF ablation of typical atrial flutter April 2015  . Pacemaker   . Stroke [facility])     pt states that he might have had a stroke not sure  . Pneumonia Aug 2016  . Hypothyroidism   . Headache   . DDD (degenerative disc disease), lumbar   . Cancer ([place])     skin cancer  . Difficult intubation      Eschmann stylet used in 2002 and 2007; "trouble waking up afterwards" (01/06/2012)    Past Surgical History  Procedure Laterality Date  . Back surgery    . Spinal cord stimulator implant  2006  . Cholecystectomy  1993  . Nasal septum surgery  1992  . Tonsillectomy and adenoidectomy  1992  . Posterior fusion lumbar spine  1999    L4-5  . Lumbar disc surgery  1994; ~ 1995; ~ 1996  . Warthin's tumor excision  1990's    right  . Permanent pacemaker insertion  01/08/2012    CHB  . US echocardiography  12/28/2011    mild LVH,mild mitral annulara ca+,mild MR  . Nuclear stress test  10/19/2004    No ischemia  . Esophagogastroduodenoscopy  11/08/2004    QQP:YPPJKD esophageal erosions consistent with erosive reflux esophagitis/Areas of hemorrhage and nodularity of the fundal mucosa of uncertain significance, biopsied.  Small hiatal hernia, otherwise normal stomach  . Colonoscopy  11/08/2004    TOI:ZTIWPY rectum, colon, TI.  Marland Kitchen Esophagogastroduodenoscopy  2010    Dr. Gala Romney: 3 columns Grade 1 varices, erosive esophagitis, HH, portal gastropathy, normal D1, D2  . Esophagogastroduodenoscopy (  egd) with esophageal dilation N/A 02/14/2013    QAS:TMHDQ 1 esophageal varices. Abnormal distal esophagus/status post biopsy after Maloney dilation. Portal gastropathy. Antral erosions-status post biopsy. path negative for H.pylori, benign path.  . Permanent pacemaker insertion N/A 01/09/2012    Procedure: PERMANENT PACEMAKER INSERTION;  Surgeon: Sanda Klein, MD;  Location: [place] CATH LAB;  Service: Cardiovascular;  Laterality: N/A;  . Atrial flutter ablation N/A 07/10/2013    Procedure: ATRIAL FLUTTER ABLATION;  Surgeon: Evans Lance, MD;  Location: [facility] CATH LAB;  Service: Cardiovascular;  Laterality: N/A;  . Colonoscopy N/A 05/28/2014    Dr. Gala Romney: Redundant colon. single colonic polyp removed as described above. Tubular adenoma  . Esophagogastroduodenoscopy N/A 05/28/2014    Dr. Gala Romney: MIld erosive reflux  esophagitis. Grade 1 esophageal varices. Patent esophagus. No dilation performed. Hiatal hernia.   . Esophageal dilation N/A 05/28/2014    Procedure: ESOPHAGEAL DILATION;  Surgeon: Daneil Dolin, MD;  [facility];  Service: Endoscopy;  Laterality: N/A;  . Spinal cord stimulator removal N/A 01/27/2015    Procedure: LUMBAR SPINAL CORD STIMULATOR REMOVAL;  Surgeon: Kristeen Miss, MD;  Location: [place] NEURO ORS;  Service: Neurosurgery;  Laterality: N/A;  LUMBAR SPINAL CORD STIMULATOR REMOVAL    There were no vitals filed for this visit.      Subjective Assessment - 10/01/15 0951    Subjective Pt reports he did alot yesterday and is feeling it in his back. He feels he has made alot of improvement in his strength/balance. He doesn't feel his back pain has changed much since it continues to come out of no where.    Pertinent History HTN, DMII, pacemaker, L4-L5 fusion   Limitations Other (comment)   How long can you sit comfortably? 2 hours   How long can you stand comfortably? 3 hours   How long can you walk comfortably? 3 hours push mowing    Patient Stated Goals get stronger and mobility.   Currently in Pain? Yes  Low back, calves and feet            [facility] PT Assessment - 10/01/15 0001    Assessment   Medical Diagnosis B LE weakness, Lumbosacral radiculopathy   Referring Provider Narda Amber, MD   Onset Date/Surgical Date --  several years ago   Next MD Visit October 28, 2015   Prior Therapy No prior therapy    Precautions   Precautions None   Balance Screen   Has the patient fallen in the past 6 months No   Has the patient had a decrease in activity level because of a fear of falling?  No   Is the patient reluctant to leave their home because of a fear of falling?  No   Home Environment   Living Environment Private residence   Living Arrangements Spouse/significant other   Type of [place] entrance   Prior Function   Level of [place] Retired   Leisure go fishing   Cognition   Overall Cognitive Status Within Functional Limits for tasks assessed   Sensation   Light Touch Appears Intact   Posture/Postural Control   Posture/Postural Control Postural limitations   Postural Limitations Rounded Shoulders;Forward head;Decreased lumbar lordosis   Posture Comments Pt with UE supporting    AROM   Lumbar Flexion to ankles, end range stretch   Lumbar Extension WNL, no pain   Lumbar - Right Side Bend 2 inches below, stretch reported "dead  center"    Lumbar - Left Side Bend 1 inch below jt line, no pain    Lumbar - Right Rotation --   Lumbar - Left Rotation --   Strength   Right Hip Flexion 5/5   Right Hip Extension 5/5   Right Hip ABduction 5/5   Left Hip Flexion 5/5   Left Hip Extension 5/5   Left Hip ABduction 5/5   Right Knee Flexion 5/5   Right Knee Extension 5/5   Left Knee Flexion 5/5   Left Knee Extension 5/5   Right Ankle Dorsiflexion 5/5   Left Ankle Dorsiflexion 5/5   Flexibility   Soft Tissue Assessment /Muscle Length yes   Hamstrings L/R: 25/15 deg  L into low back   Quadriceps (+) thomas for B hip flexor tightness   Piriformis WNL   Palpation   Palpation comment --   Special Tests    Special Tests --   Lumbar Tests --   Straight Leg Raise   Findings --   Side  --   Comment --   Ambulation/Gait   Ambulation/Gait --   Standardized Balance Assessment   Standardized Balance Assessment Timed Up and Go Test;Berg Balance Test;Five Times Sit to Stand   Five times sit to stand comments  10.11 sec   Berg Balance Test   Sit to Stand Able to stand without using hands and stabilize independently   Standing Unsupported Able to stand safely 2 minutes   Sitting with Back Unsupported but Feet Supported on Floor or Stool Able to sit safely and securely 2 minutes   Stand to Sit Sits safely with minimal use of hands   Transfers Able to transfer safely, minor use of hands   Standing  Unsupported with Eyes Closed Able to stand 10 seconds safely  anterior lean   Standing Ubsupported with Feet Together Able to place feet together independently and stand 1 minute safely  postural sway noted   From Standing, Reach Forward with Outstretched Arm Can reach confidently >25 cm (10")  10"   From Standing Position, Pick up Object from Floor Able to pick up shoe safely and easily   From Standing Position, Turn to Look Behind Over each Shoulder Looks behind from both sides and weight shifts well   Turn 360 Degrees Able to turn 360 degrees safely in 4 seconds or less   Standing Unsupported, Alternately Place Feet on Step/Stool Able to stand independently and safely and complete 8 steps in 20 seconds  8 sec   Standing Unsupported, One Foot in Front Able to place foot tandem independently and hold 30 seconds   Standing on One Leg Able to lift leg independently and hold 5-10 seconds  R: 10 sec, L: 7-8 sec   Total Score 55   Timed Up and Go Test   TUG --   Normal TUG (seconds) --                     [facility] Adult PT Treatment/Exercise - 10/01/15 0001    Lumbar Exercises: Standing   Other Standing Lumbar Exercises single leg march with 1 UE support, x15 reps each                PT Education - 10/01/15 1239    Education provided Yes   Education Details goals met, end of POC; encouraged continued HEP adherence atleast 3-4x a week to maintain function; discussed importance and benefit of daily swimming/walking program.   Methods Explanation  Comprehension Verbalized understanding          PT Short Term Goals - 10/01/15 1011    PT SHORT TERM GOAL #1   Title Pt will demo understanding and indep with updated HEP.   Time 2   Period Weeks   Status Achieved   PT SHORT TERM GOAL #2   Title Pt will correctly repeat and demo correct log rolling technique during sit to/from supine without cuing from therapist   Baseline pt able to perform without cues from PT    Time 2   Period Weeks   Status Achieved   PT SHORT TERM GOAL #3   Title Pt will demo improved pain report of no greater than 5/10 throughout the day to improve his participation in ADLs.   Baseline reports max pain 8/10   Time 4   Period Weeks   Status Not Met           PT Long Term Goals - 10/01/15 1012    PT LONG TERM GOAL #1   Title Pt will demo improved LE strength to atleast 4/5 throughout BLE to improve his safety with sit to stand transitions.    Baseline 5/5   Time 8   Period Weeks   Status Achieved   PT LONG TERM GOAL #2   Title Pt will demo improved functional strength and endurance evident by an improvement in 5x sit to stand to less than 12 sec without use of UE   Baseline Met 09/08/15: Pt will demo improved functional strength and endurance evident by an improvement in 5x sit to stand to less than 30 sec without use of UE - 22 sec   Time 8   Period Weeks   Status Achieved   PT LONG TERM GOAL #3   Title Pt will demonstrate improved TUG time in less than 14 sec and without and AD to decrease his risk of falls in the community.    Baseline 09/08/15 12.8 sec   Time 8   Period Weeks   Status Achieved   PT LONG TERM GOAL #4   Title Pt will demonstrate improved balance evident by a Berg Balance Score improvement of atleast 4 points to decrease his risk of falls and injury.   Baseline initial: 41; 09/08/15: 48; 10/01/15: 55/56   Time 8   Period Weeks   Status Achieved   PT LONG TERM GOAL #5   Title Pt will report decrease in pain to no greater than 3/10 to improve his tolerance of activity and ADLs.    Baseline 8/10 max   Time 8   Period Weeks   Status Not Met               Plan - 10/01/15 1240    Clinical Impression Statement Jaqualyn has made good progress since beginning PT several weeks ago. Although his back pain is seemingly unchanged, he demonstrates improved lumbar AROM as well as minimal pain report during today's reassessment. He also exhibits improved  BLE strength, balance and functional performance evident by his score of 55/56 on the Berg balance test, and ability to perform 5x sit to stand in ~10 sec. He has met almost all of his goals, with the remaining goals being geared towards back pain. He is currently mowing his yard (pushing and riding) atleast 1x a week and feels he is much stronger and balanced than he was initially. He is consistently performing his HEP and feels confident he can now continue  to work on his exercises on his own. I encouraged him to begin some type of daily walking/swimming program at home to address his chronic back pain issues and he was in agreement with this. He no longer requires skilled PT to address his limitations and is being discharged from PT with independence and happy with current functional level/improvement.    Rehab Potential Fair   Clinical Impairments Affecting Rehab Potential chronicity of back pain   PT Frequency 2x / week   PT Duration 4 weeks   PT Treatment/Interventions ADLs/Self Care Home Management;Aquatic Therapy;Moist Heat;Patient/family education;Neuromuscular re-education;Balance training;Therapeutic exercise;Therapeutic activities;Functional mobility training;Stair training;Gait training;Manual techniques;Passive range of motion;Energy conservation   PT Next Visit Plan d/c with advanced HEP   PT Home Exercise Plan updated with SLS hold with contralateral LE march   Recommended Other Services none   Consulted and Agree with Plan of Care Patient      Patient will benefit from skilled therapeutic intervention in order to improve the following deficits and impairments:  Abnormal gait, Decreased activity tolerance, Decreased balance, Decreased endurance, Decreased safety awareness, Decreased range of motion, Decreased mobility, Decreased strength, Difficulty walking, Hypomobility, Impaired flexibility, Increased muscle spasms, Improper body mechanics, Postural dysfunction, Pain  Visit  Diagnosis: Muscle weakness (generalized)  Unsteadiness on feet  Midline low back pain, with sciatica presence unspecified  Other abnormalities of gait and mobility  Other symptoms and signs involving the musculoskeletal system       G-Codes - 06-Oct-2015 1248    Functional Assessment Tool Used clinical judgement based on assessment of ROM, strength and functional mobility   Functional Limitation Mobility: Walking and moving around   Mobility: Walking and Moving Around Goal Status [phone]) At least 40 percent but less than 60 percent impaired, limited or restricted   Mobility: Walking and Moving Around Discharge Status [phone]) At least 40 percent but less than 60 percent impaired, limited or restricted      Problem List Patient Active Problem List   Diagnosis Date Noted  . Hyperlipidemia 04/02/2015  . Essential hypertension, benign 04/02/2015  . Other specified hypothyroidism 04/02/2015  . History of colonic polyps   . Encounter for screening colonoscopy 05/07/2014  . Psoriasis 10/15/2013  . Hypoxemia 09/05/2013  . SVT (supraventricular tachycardia) ([place]) 06/06/2013  . COPD (chronic obstructive pulmonary disease) ([place]) 06/06/2013  . Cardiac pacemaker in situ 06/06/2013  . Pacemaker 05/22/2013  . Cirrhosis ([place]) 01/22/2013  . Dyspepsia 01/22/2013  . Anemia 01/22/2013  . Atrial fibrillation ([place]) 11/21/2012  . Atrial flutter ([place]) 11/21/2012  . Chest pain 11/20/2012  .  Acute on chronic diastolic heart failure, predominantly Right heart failure 01/09/2012  . Syncope 01/06/2012  . Sinus arrest, 5 second pause on event monitor 01/06/2012  . Type II diabetes mellitus, uncontrolled ([place]) 01/06/2012  . Obesity 01/06/2012  . Smoker 01/06/2012  . Obstructive sleep apnea 01/06/2012  . DJD, on disability secondary to back pain, surg X 3 01/06/2012  . ABDOMINAL PAIN, RIGHT UPPER QUADRANT 06/14/2009  . ABDOMINAL PAIN 05/14/2009  . Hepatic cirrhosis ([place]) 01/13/2009  . GERD  12/04/2008  . GASTRITIS, ACUTE 12/04/2008  . HOARSENESS 12/04/2008  . CHEST PAIN 12/04/2008  . Dysphagia, pharyngoesophageal phase 12/04/2008   12:51 PM,10/06/2015 Elly Modena PT, DPT [facility] [place] [address] [place], [place], [address] Phone: [phone]   Fax:  [phone]  Name: DACARI BECKSTRAND MRN: [id number] Date of Birth: 1950/12/04   PHYSICAL THERAPY DISCHARGE  SUMMARY  Visits from Start of Care: 15  Current functional level related to goals / functional outcomes: 5/5 BLE strength, 55/56 Berg balance score (Low falls risk), improved lumbar AROM   Remaining deficits: Low back pain reported during daily activity   Education / Equipment: Advanced home management program Plan: Patient agrees to discharge.  Patient goals were met. Patient is being discharged due to meeting the stated rehab goals.  ?????    *All goals met besides max pain goals for low back.   1:05 PM,10/01/2015 Elly Modena PT, [facility] [phone]

## 2015-10-03 ENCOUNTER — Other Ambulatory Visit: Payer: Self-pay | Admitting: Internal Medicine

## 2015-10-06 ENCOUNTER — Encounter (HOSPITAL_COMMUNITY): Payer: Medicare HMO | Admitting: Physical Therapy

## 2015-10-07 ENCOUNTER — Ambulatory Visit (INDEPENDENT_AMBULATORY_CARE_PROVIDER_SITE_OTHER): Payer: Medicare HMO | Admitting: "Endocrinology

## 2015-10-07 ENCOUNTER — Encounter: Payer: Self-pay | Admitting: "Endocrinology

## 2015-10-07 VITALS — BP 109/67 | HR 66 | Ht 70.0 in | Wt 220.0 lb

## 2015-10-07 DIAGNOSIS — Z794 Long term (current) use of insulin: Secondary | ICD-10-CM | POA: Diagnosis not present

## 2015-10-07 DIAGNOSIS — E785 Hyperlipidemia, unspecified: Secondary | ICD-10-CM

## 2015-10-07 DIAGNOSIS — IMO0002 Reserved for concepts with insufficient information to code with codable children: Secondary | ICD-10-CM

## 2015-10-07 DIAGNOSIS — E038 Other specified hypothyroidism: Secondary | ICD-10-CM

## 2015-10-07 DIAGNOSIS — E1159 Type 2 diabetes mellitus with other circulatory complications: Secondary | ICD-10-CM | POA: Diagnosis not present

## 2015-10-07 DIAGNOSIS — I10 Essential (primary) hypertension: Secondary | ICD-10-CM | POA: Diagnosis not present

## 2015-10-07 DIAGNOSIS — E1165 Type 2 diabetes mellitus with hyperglycemia: Secondary | ICD-10-CM | POA: Diagnosis not present

## 2015-10-07 NOTE — Progress Notes (Signed)
Subjective:    Patient ID: Ralph [name], male    DOB: [birth year], PCP Purvis Kilts, MD   Past Medical History  Diagnosis Date  . Hypertension   . 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 Depressed   . Varicose vein     of esophagus  . CHF (congestive heart failure) ([place]) 01/06/2012  . Sinus pause 01/06/2012    5.2 seconds  . Anginal pain ([place])   . Sleep apnea     "don't wear mask" (01/06/2012)  . Emphysema   . Type II diabetes mellitus ([place])   . GERD (gastroesophageal reflux disease)   . H/O hiatal hernia   . Coughing up blood     "comes from my throat" (01/06/2012)  . Arthritis     "back; fingers" (01/06/2012)  . Chronic lower back pain   . Fatty liver disease, nonalcoholic   . CHB (complete heart block) ([place])   . Presence of permanent cardiac pacemaker 9/292013    [facility]  . Atrial flutter [facility])     s/p EPS +RF ablation of typical atrial flutter April 2015  . Pacemaker   . Stroke [facility])     pt states that he might have had a stroke not sure  . Pneumonia Aug 2016  . Hypothyroidism   . Headache   . DDD (degenerative disc disease), lumbar   . Cancer ([place])     skin cancer  . Difficult intubation     Eschmann stylet used in 2002 and 2007; "trouble waking up afterwards" (01/06/2012)   Past Surgical History  Procedure Laterality Date  . Back surgery    . Spinal cord stimulator implant  2006  . Cholecystectomy  1993  . Nasal septum surgery  1992  . Tonsillectomy and adenoidectomy  1992  . Posterior fusion lumbar spine  1999    L4-5  . Lumbar disc surgery  1994; ~ 1995; ~ 1996  . Warthin's tumor excision  1990's    right  . Permanent pacemaker insertion  01/08/2012    CHB  . US echocardiography  12/28/2011    mild LVH,mild mitral annulara ca+,mild MR  . Nuclear stress test  10/19/2004    No ischemia  . Esophagogastroduodenoscopy  11/08/2004    [id number] esophageal erosions consistent with erosive reflux esophagitis/Areas of hemorrhage and  nodularity of the fundal mucosa of uncertain significance, biopsied.  Small hiatal hernia, otherwise normal stomach  . Colonoscopy  11/08/2004    [id number] rectum, colon, TI.  Marland Kitchen Esophagogastroduodenoscopy  2010    Dr. Gala Romney: 3 columns Grade 1 varices, erosive esophagitis, HH, portal gastropathy, normal D1, D2  . Esophagogastroduodenoscopy (egd) with esophageal dilation N/A 02/14/2013    [id number] esophageal varices. Abnormal distal esophagus/status post biopsy after Maloney dilation. Portal gastropathy. Antral erosions-status post biopsy. path negative for H.pylori, benign path.  . Permanent pacemaker insertion N/A 01/09/2012    Procedure: PERMANENT PACEMAKER INSERTION;  Surgeon: Sanda Klein, MD;  Location: [place] CATH LAB;  Service: Cardiovascular;  Laterality: N/A;  . Atrial flutter ablation N/A 07/10/2013    Procedure: ATRIAL FLUTTER ABLATION;  Surgeon: Evans Lance, MD;  Location[facility] CATH LAB;  Service: Cardiovascular;  Laterality: N/A;  . Colonoscopy N/A 05/28/2014    Dr. Gala Romney: Redundant colon. single colonic polyp removed as described above. Tubular adenoma  . Esophagogastroduodenoscopy N/A 05/28/2014    Dr. Gala Romney: MIld erosive reflux esophagitis. Grade 1 esophageal varices. Patent esophagus. No dilation performed. Hiatal  hernia.   . Esophageal dilation N/A 05/28/2014    Procedure: ESOPHAGEAL DILATION;  Surgeon: Daneil Dolin, MD;  Location: [facility];  Service: Endoscopy;  Laterality: N/A;  . Spinal cord stimulator removal N/A 01/27/2015    Procedure: LUMBAR SPINAL CORD STIMULATOR REMOVAL;  Surgeon: Kristeen Miss, MD;  Location: [place] NEURO ORS;  Service: Neurosurgery;  Laterality: N/A;  LUMBAR SPINAL CORD STIMULATOR REMOVAL   Social History   Social History  . Marital Status: Married    Spouse Name: N/A  . Number of Children: N/A  . Years of Education: N/A   Social History Main Topics  . Smoking status: Former Smoker -- 0.50 packs/day for 45 years    Types: Cigarettes    Start  date: 04/11/1968    Quit date: 04/12/2015  . Smokeless tobacco: Never Used     Comment: Quit x 8 months this time  . Alcohol Use: No     Comment: "quit alcohol 2011" Previously drinking socially about twice per month  . Drug Use: No  . Sexual Activity: Not Currently   Other Topics Concern  . None   Social History Narrative   Lives with wife in a one story home.  Has 3 children.     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     Education: some college.   Outpatient Encounter Prescriptions as of 10/07/2015  Medication Sig  . Insulin Detemir (LEVEMIR [place]) Inject 60 Units into the skin at bedtime.  [name] ELLIPTA 62.5-25 MCG/INH AEPB INHALE 1 PUFF ONCE DAILY  . Apremilast (OTEZLA) 30 MG TABS Take 30 mg by mouth 2 (two) times daily.  Marland Kitchen buPROPion (WELLBUTRIN SR) 150 MG 12 hr tablet Take 150 mg by mouth 2 (two) times daily.  . Cholecalciferol (VITAMIN D PO) Take 1 tablet by mouth daily.  . Cyanocobalamin (VITAMIN B 12 PO) Take 1 tablet by mouth daily.  . diazepam (VALIUM) 10 MG tablet Take 10 mg by mouth every 6 (six) hours as needed for anxiety.  Marland Kitchen diltiazem (CARDIZEM CD) 180 MG 24 hr capsule Take 1 capsule (180 mg total) by mouth daily.  Marland Kitchen escitalopram (LEXAPRO) 10 MG tablet Take 10 mg by mouth daily.  . flecainide (TAMBOCOR) 100 MG tablet TAKE 1 TABLET BY MOUTH TWICE A DAY  . furosemide (LASIX) 20 MG tablet Take 2 tablets (40 mg total) by mouth daily as needed (swelling).  . INVOKANA 300 MG TABS tablet TAKE 1 TABLET EVERY DAY  . levothyroxine (SYNTHROID, LEVOTHROID) 112 MCG tablet TAKE 1 TABLET (112 MCG TOTAL) BY MOUTH DAILY BEFORE BREAKFAST.  . metFORMIN (GLUCOPHAGE) 1000 MG tablet TAKE 1 TABLET BY MOUTH TWICE A DAY  . Multiple Vitamin (MULTIVITAMIN) tablet Take 1 tablet by mouth daily.  . nadolol (CORGARD) 40 MG tablet Take 1 tablet (40 mg) in the morning and 1/2 (20 mg) tablet by mouth every evening  . ondansetron (ZOFRAN) 4 MG tablet Take 1 tablet (4 mg total) by mouth every 8 (eight)  hours as needed for nausea or vomiting.  . OXYGEN Inhale 2 L into the lungs at bedtime.  . pantoprazole (PROTONIX) 40 MG tablet Take 1 tablet (40 mg total) by mouth daily.  . simvastatin (ZOCOR) 20 MG tablet TAKE 1 TABLET (20 MG TOTAL) BY MOUTH EVERY EVENING.  Marland Kitchen spironolactone (ALDACTONE) 50 MG tablet TAKE 1 TABLET (50 MG TOTAL) BY MOUTH 2 (TWO) TIMES DAILY.  . traMADol (ULTRAM) 50 MG tablet Take 50 mg by mouth every 6 (six) hours as  needed.  Alveda Reasons 20 MG TABS tablet TAKE 1 TABLET BY MOUTH EVERY DAY WITH DINNER  . [DISCONTINUED] LEVEMIR 100 UNIT/ML injection INJECT 70 UNITS SUBCUTANEOUS AT BEDTIME  . [DISCONTINUED] NOVOLOG FLEXPEN 100 UNIT/ML FlexPen INJECT 10 - 16 UNITS SUBCUTANEOUSLY 3 TIMES A DAY BEFORE MEALS (Patient taking differently: INJECT 5-10  UNITS SUBCUTANEOUSLY 3 TIMES A DAY BEFORE MEALS)   No facility-administered encounter medications on file as of 10/07/2015.   ALLERGIES: Allergies  Allergen Reactions  . Nitroglycerin Hives, Swelling and Rash   VACCINATION STATUS: Immunization History  Administered Date(s) Administered  . Influenza Split 01/09/2013  . Influenza,inj,Quad PF,36+ Mos 01/09/2014, 12/23/2014  . Pneumococcal Polysaccharide-23 01/10/2012    Diabetes He presents for his follow-up diabetic visit. He has type 2 diabetes mellitus. Onset time: He was diagnosed at approximate age of 58 years. His disease course has been improving. There are no hypoglycemic associated symptoms. Pertinent negatives for hypoglycemia include no confusion, headaches, pallor or seizures. There are no diabetic associated symptoms. Pertinent negatives for diabetes include no chest pain, no fatigue, no polydipsia, no polyphagia, no polyuria and no weakness. There are no hypoglycemic complications. Symptoms are improving. Diabetic complications include a CVA, heart disease and nephropathy. Risk factors for coronary artery disease include diabetes mellitus, dyslipidemia, hypertension, male sex,  obesity, sedentary lifestyle and tobacco exposure. Current diabetic treatment includes intensive insulin program. He is compliant with treatment most of the time. His weight is decreasing steadily. He is following a diabetic diet. When asked about meal planning, he reported none. He participates in exercise intermittently. There is no change in his home blood glucose trend. His breakfast blood glucose range is generally 130-140 mg/dl. His lunch blood glucose range is generally 130-140 mg/dl. His dinner blood glucose range is generally 130-140 mg/dl. His overall blood glucose range is 130-140 mg/dl.  Hyperlipidemia This is a chronic problem. The current episode started more than 1 year ago. Pertinent negatives include no chest pain, myalgias or shortness of breath. Current antihyperlipidemic treatment includes statins. Risk factors for coronary artery disease include dyslipidemia, diabetes mellitus, hypertension, male sex and a sedentary lifestyle.  Hypertension This is a chronic problem. The current episode started more than 1 year ago. Pertinent negatives include no chest pain, headaches, neck pain, palpitations or shortness of breath. Risk factors for coronary artery disease include diabetes mellitus, dyslipidemia, obesity, sedentary lifestyle and smoking/tobacco exposure. Compliance problems include diet.  Hypertensive end-organ damage includes CVA and a thyroid problem.  Thyroid Problem Presents for follow-up visit. Patient reports no cold intolerance, constipation, diarrhea, fatigue, heat intolerance or palpitations. The symptoms have been improving. Past treatments include levothyroxine. His past medical history is significant for hyperlipidemia.     Review of Systems  Constitutional: Negative for fatigue and unexpected weight change.  HENT: Negative for dental problem, mouth sores and trouble swallowing.   Eyes: Negative for visual disturbance.  Respiratory: Negative for cough, choking, chest  tightness, shortness of breath and wheezing.   Cardiovascular: Negative for chest pain, palpitations and leg swelling.  Gastrointestinal: Negative for nausea, vomiting, abdominal pain, diarrhea, constipation and abdominal distention.  Endocrine: Negative for cold intolerance, heat intolerance, polydipsia, polyphagia and polyuria.  Genitourinary: Negative for dysuria, urgency, hematuria and flank pain.  Musculoskeletal: Negative for myalgias, back pain, gait problem and neck pain.  Skin: Negative for pallor, rash and wound.  Neurological: Negative for seizures, syncope, weakness, numbness and headaches.  Psychiatric/Behavioral: Negative.  Negative for confusion and dysphoric mood.    Objective:  BP 109/67 mmHg  Pulse 66  Ht 5\' 10"  (1.778 m)  Wt 220 lb (99.791 kg)  BMI 31.57 kg/m2  Wt Readings from Last 3 Encounters:  10/07/15 220 lb (99.791 kg)  09/30/15 218 lb (98.884 kg)  07/23/15 238 lb 3 oz (108.041 kg)    Physical Exam  Constitutional: He is oriented to person, place, and time. He appears well-developed and well-nourished. He is cooperative. No distress.  HENT:  Head: Normocephalic and atraumatic.  Eyes: EOM are normal.  Neck: Normal range of motion. Neck supple. No tracheal deviation present. No thyromegaly present.  Cardiovascular: Normal rate, S1 normal, S2 normal and normal heart sounds.  Exam reveals no gallop.   No murmur heard. Pulses:      Dorsalis pedis pulses are 1+ on the right side, and 1+ on the left side.       Posterior tibial pulses are 1+ on the right side, and 1+ on the left side.  Pulmonary/Chest: Breath sounds normal. No respiratory distress. He has no wheezes.  Abdominal: Soft. Bowel sounds are normal. He exhibits no distension. There is no tenderness. There is no guarding and no CVA tenderness.  Musculoskeletal: He exhibits no edema.       Right shoulder: He exhibits no swelling and no deformity.  Neurological: He is alert and oriented to person,  place, and time. He has normal strength and normal reflexes. No cranial nerve deficit or sensory deficit. Gait normal.  Skin: Skin is warm and dry. No rash noted. No cyanosis. Nails show no clubbing.  Psychiatric: He has a normal mood and affect. His speech is normal and behavior is normal. Judgment and thought content normal. Cognition and memory are normal.    CMP     Component Value Date/Time   NA 138 09/28/2015 0926   K 5.2 09/28/2015 0926   CL 101 09/28/2015 0926   CO2 27 09/28/2015 0926   GLUCOSE 96 09/28/2015 0926   BUN 24 09/28/2015 0926   CREATININE 0.73 09/28/2015 0926   CREATININE 1.00 01/22/2015 1055   CALCIUM 9.1 09/28/2015 0926   PROT 6.8 06/05/2015 1130   ALBUMIN 4.1 06/05/2015 1130   ALBUMIN 3.3 12/17/2012   AST 19 06/05/2015 1130   AST 23 12/17/2012   ALT 30 06/05/2015 1130   ALKPHOS 114 06/05/2015 1130   ALKPHOS 423 12/17/2012   BILITOT 0.4 06/05/2015 1130   BILITOT 0.7 12/17/2012   GFRNONAA >60 01/22/2015 1055   GFRAA >60 01/22/2015 1055   Diabetic Labs (most recent): Lab Results  Component Value Date   HGBA1C 5.9* 09/28/2015   HGBA1C 7.5* 06/24/2015   HGBA1C 6.8* 01/22/2015   Most Recent Lipid Panel Lipid Panel     Component Value Date/Time   CHOL 118 05/30/2014 0709   TRIG 166* 05/30/2014 0709   HDL 40 05/30/2014 0709   CHOLHDL 3.0 05/30/2014 0709   VLDL 33 05/30/2014 0709   LDLCALC 45 05/30/2014 0709   Results for DAMARIAE, CARIE (MRN [id number]) as of 10/07/2015 10:11  Ref. Range 09/28/2015 09:26  TSH Latest Ref Range: 0.40-4.50 mIU/L 0.76  T4,Free(Direct) Latest Ref Range: 0.8-1.8 ng/dL 1.4    Assessment & Plan:   1. Uncontrolled type 2 diabetes mellitus with complication, with long-term current use of insulin (HCC)  - His diabetes is  complicated by coronary artery disease/cardiomyopathy, cerebrovascular accident, and patient remains at a high risk for more acute and chronic complications of diabetes which include CAD, CVA, CKD,  retinopathy, and neuropathy. These are  all discussed in detail with the patient.  Patient came with controlled glucose profile, and  recent A1c of  5.9% improving from 7.5% .  Glucose logs and insulin administration records pertaining to this visit,  to be scanned into patient's records.  Recent labs reviewed.   - I have re-counseled the patient on diet management and weight loss  by adopting a carbohydrate restricted / protein rich  Diet.  - Suggestion is made for patient to avoid simple carbohydrates   from their diet including Cakes , Desserts, Ice Cream,  Soda (  diet and regular) , Sweet Tea , Candies,  Chips, Cookies, Artificial Sweeteners,   and "Sugar-free" Products .  This will help patient to have stable blood glucose profile and potentially avoid unintended  Weight gain.  - Patient is advised to stick to a routine mealtimes to eat 3 meals  a day and avoid unnecessary snacks ( to snack only to correct hypoglycemia).  - The patient  has been  scheduled with Jearld Fenton, RDN, CDE for individualized DM education.  - I have approached patient with the following individualized plan to manage diabetes and patient agrees.  - He did very well since last visit. I will continue to adjust his insulin regimen.  - Lower Levemir to 60 units qhs, hold NovoLog for now, continue monitoring of BG ac  breakfast  and hs.  -Adjustment parameters for hypo and hyperglycemia were given in a written document to patient.  -Patient is encouraged to call clinic for blood glucose levels less than 70 or above 300 mg /dl.  -I will continue Metformin 1000mg  po BID, add Invokana 300mg  po qam, SE discussed with patient.   - Patient specific target  for A1c; LDL, HDL, Triglycerides, and  Waist Circumference were discussed in detail.  2) BP/HTN: Controlled. Continue current medications . 3) Lipids/HPL:  continue statins. 4)  Weight/Diet: CDE consult in progress, exercise, and carbohydrates information  provided. 5)  hypothyroidism -I will continue  levothyroxine to 112 g by mouth every morning.  - We discussed about correct intake of levothyroxine, at fasting, with water, separated by at least 30 minutes from breakfast, and separated by more than 4 hours from calcium, iron, multivitamins, acid reflux medications (PPIs). -Patient is made aware of the fact that thyroid hormone replacement is needed for life, dose to be adjusted by periodic monitoring of thyroid function tests.   6) Chronic Care/Health Maintenance:  -Patient  is Statin medications and encouraged to continue to follow up with Ophthalmology, Podiatrist at least yearly or according to recommendations, and advised to quit smoking. I have recommended yearly flu vaccine and pneumonia vaccination at least every 5 years; moderate intensity exercise for up to 150 minutes weekly; and  sleep for at least 7 hours a day.  - 25 minutes of time was spent on the care of this patient , 50% of which was applied for counseling on diabetes complications and their preventions.  - I advised patient to maintain close follow up with Purvis Kilts, MD for primary care needs.  Patient is asked to bring meter and  blood glucose logs during their next visit.   Follow up plan: -Return in about 3 months (around 01/07/2016) for meter, and logs, diabetes, high blood pressure, high cholesterol, underactive thyroid.  Glade Lloyd, MD Phone: [phone]  Fax: [phone]   10/07/2015, 10:18 AM

## 2015-10-07 NOTE — Patient Instructions (Signed)

## 2015-10-14 ENCOUNTER — Other Ambulatory Visit: Payer: Self-pay

## 2015-10-14 MED ORDER — CANAGLIFLOZIN 300 MG PO TABS: 300.0000 mg | ORAL_TABLET | Freq: Every day | ORAL | Status: DC

## 2015-10-19 ENCOUNTER — Other Ambulatory Visit: Payer: Self-pay

## 2015-10-19 MED ORDER — INSULIN DETEMIR 100 UNIT/ML FLEXPEN: 60.0000 [IU] | PEN_INJECTOR | Freq: Every day | SUBCUTANEOUS | Status: DC

## 2015-10-20 ENCOUNTER — Other Ambulatory Visit: Payer: Self-pay

## 2015-10-20 MED ORDER — RIVAROXABAN 20 MG PO TABS: ORAL_TABLET | ORAL | Status: DC

## 2015-10-20 NOTE — Telephone Encounter (Signed)
Refill complete 

## 2015-10-22 DIAGNOSIS — R69 Illness, unspecified: Secondary | ICD-10-CM | POA: Diagnosis not present

## 2015-10-23 DIAGNOSIS — I4891 Unspecified atrial fibrillation: Secondary | ICD-10-CM | POA: Diagnosis not present

## 2015-10-23 DIAGNOSIS — I5030 Unspecified diastolic (congestive) heart failure: Secondary | ICD-10-CM | POA: Diagnosis not present

## 2015-10-23 DIAGNOSIS — J9610 Chronic respiratory failure, unspecified whether with hypoxia or hypercapnia: Secondary | ICD-10-CM | POA: Diagnosis not present

## 2015-10-23 DIAGNOSIS — J449 Chronic obstructive pulmonary disease, unspecified: Secondary | ICD-10-CM | POA: Diagnosis not present

## 2015-10-28 ENCOUNTER — Ambulatory Visit (INDEPENDENT_AMBULATORY_CARE_PROVIDER_SITE_OTHER): Payer: Medicare HMO | Admitting: Neurology

## 2015-10-28 ENCOUNTER — Encounter: Payer: Self-pay | Admitting: Neurology

## 2015-10-28 VITALS — BP 100/70 | HR 61 | Ht 70.0 in | Wt 215.0 lb

## 2015-10-28 DIAGNOSIS — G5622 Lesion of ulnar nerve, left upper limb: Secondary | ICD-10-CM

## 2015-10-28 DIAGNOSIS — E0842 Diabetes mellitus due to underlying condition with diabetic polyneuropathy: Secondary | ICD-10-CM | POA: Diagnosis not present

## 2015-10-28 DIAGNOSIS — R278 Other lack of coordination: Secondary | ICD-10-CM

## 2015-10-28 DIAGNOSIS — M5417 Radiculopathy, lumbosacral region: Secondary | ICD-10-CM | POA: Diagnosis not present

## 2015-10-28 NOTE — Progress Notes (Signed)
Follow-up Visit   Date: 10/28/2015    Ralph [name] MRN: [id number] DOB: 1950/11/16   Interim History: Ralph [name] is a 65 y.o. right-handed Caucasian male with hypertension, NASH cirrhosis, diabetes mellitus, depression, hypothyroidism, and atrial fibrillation on xeralto s/p pacemaker, and multiple prior lumbar surgeries with fusion returning to the clinic for follow-up of bilateral leg weakness and imbalance.  The patient was accompanied to the clinic by wife who also provides collateral information.    History of present illness: Starting around 2012, he began noticing progressively worsen imbalance. Symptoms have worsened recently as he has fallen twice this year, but stumbles multiple times per day. He endorses weakness of the legs, especially with getting up from a low chair and climbing stairs. He also used to get a lot of tingling into his legs.   He also complains of whole-body dull and achy pain, mostly in the joints. He has chronic pain and had spinal cord stimulator placed in 2007 but in October 2016 due to it being nonfunctional. Since having the spinal cord stimulator removed, much of his tingling has resolved. He had tramadol and hydrocodone which he takes for his achy, whole-body pain. He denies any painful tingling, electrical pain.   He reports having four prior lumbar surgeries including lumbar fusion by Dr. Ellene Route. There are no plans for future surgeries at this time.  UPDATE 10/28/2015:  He reports significant improvement with PT and feels that his balance and leg weakness is better.  He has not had any interval falls and feels more stable walking independently.  He was diagnosed with psoriatic arthritis and sees Dr. Trudie Reed.  No new complaints.    Medications:  Current Outpatient Prescriptions on File Prior to Visit  Medication Sig Dispense Refill  . ANORO ELLIPTA 62.5-25 MCG/INH AEPB INHALE 1 PUFF ONCE DAILY 180 each 3  . Apremilast (OTEZLA) 30 MG TABS  Take 30 mg by mouth 2 (two) times daily.    Marland Kitchen buPROPion (WELLBUTRIN SR) 150 MG 12 hr tablet Take 150 mg by mouth 2 (two) times daily.    . canagliflozin (INVOKANA) 300 MG TABS tablet Take 1 tablet (300 mg total) by mouth daily. 90 tablet 0  . Cholecalciferol (VITAMIN D PO) Take 1 tablet by mouth daily.    . Cyanocobalamin (VITAMIN B 12 PO) Take 1 tablet by mouth daily.    . diazepam (VALIUM) 10 MG tablet Take 10 mg by mouth every 6 (six) hours as needed for anxiety.    Marland Kitchen diltiazem (CARDIZEM CD) 180 MG 24 hr capsule Take 1 capsule (180 mg total) by mouth daily. 90 capsule 3  . escitalopram (LEXAPRO) 10 MG tablet Take 10 mg by mouth daily.    . flecainide (TAMBOCOR) 100 MG tablet TAKE 1 TABLET BY MOUTH TWICE A DAY 180 tablet 1  . furosemide (LASIX) 20 MG tablet Take 2 tablets (40 mg total) by mouth daily as needed (swelling). 60 tablet 6  . Insulin Detemir (LEVEMIR FLEXTOUCH) 100 UNIT/ML Pen Inject 60 Units into the skin at bedtime. 30 mL 2  . levothyroxine (SYNTHROID, LEVOTHROID) 112 MCG tablet TAKE 1 TABLET (112 MCG TOTAL) BY MOUTH DAILY BEFORE BREAKFAST. 90 tablet 0  . metFORMIN (GLUCOPHAGE) 1000 MG tablet TAKE 1 TABLET BY MOUTH TWICE A DAY 180 tablet 0  . Multiple Vitamin (MULTIVITAMIN) tablet Take 1 tablet by mouth daily.    . nadolol (CORGARD) 40 MG tablet Take 1 tablet (40 mg) in the morning and 1/2 (20 mg)  tablet by mouth every evening 135 tablet 3  . ondansetron (ZOFRAN) 4 MG tablet Take 1 tablet (4 mg total) by mouth every 8 (eight) hours as needed for nausea or vomiting. 40 tablet 0  . OXYGEN Inhale 2 L into the lungs at bedtime.    . pantoprazole (PROTONIX) 40 MG tablet Take 1 tablet (40 mg total) by mouth daily. 90 tablet 5  . rivaroxaban (XARELTO) 20 MG TABS tablet TAKE 1 TABLET BY MOUTH EVERY DAY WITH DINNER 90 tablet 3  . simvastatin (ZOCOR) 20 MG tablet TAKE 1 TABLET (20 MG TOTAL) BY MOUTH EVERY EVENING. 90 tablet 3  . spironolactone (ALDACTONE) 50 MG tablet TAKE 1 TABLET (50 MG  TOTAL) BY MOUTH 2 (TWO) TIMES DAILY. 180 tablet 3  . traMADol (ULTRAM) 50 MG tablet Take 50 mg by mouth every 6 (six) hours as needed.     No current facility-administered medications on file prior to visit.    Allergies:  Allergies  Allergen Reactions  . Nitroglycerin Hives, Swelling and Rash    Review of Systems:  CONSTITUTIONAL: No fevers, chills, night sweats, or weight loss.  EYES: No visual changes or eye pain ENT: No hearing changes.  No history of nose bleeds.   RESPIRATORY: No cough, wheezing and shortness of breath.   CARDIOVASCULAR: Negative for chest pain, and palpitations.   GI: Negative for abdominal discomfort, blood in stools or black stools.  No recent change in bowel habits.   GU:  No history of incontinence.   MUSCLOSKELETAL: +history of joint pain or swelling.  No myalgias.   SKIN: Negative for lesions, rash, and itching.   ENDOCRINE: Negative for cold or heat intolerance, polydipsia or goiter.   PSYCH:  No depression or anxiety symptoms.   NEURO: As Above.   Vital Signs:  BP 100/70 mmHg  Pulse 61  Ht 5\' 10"  (1.778 m)  Wt 215 lb (97.523 kg)  BMI 30.85 kg/m2  SpO2 92% Neurological Exam: MENTAL STATUS including orientation to time, place, person, recent and remote memory, attention span and concentration, language, and fund of knowledge is normal.  Speech is not dysarthric.  CRANIAL NERVES: Face is symmetric.   MOTOR:  Motor strength is 5/5 in all extremities, except left intrinsic hand weakness (4/5) with severe FDI and ADM atrophy. ABP is intact. No pronator drift.  Tone is normal.    MSRs:  Reflexes are 2+/4 throughout, except absent in the lower extremities.  SENSORY: Absent vibration distal to ankles bilaterally.  Very mild sway with Rhomberg testing (improved).  COORDINATION/GAIT: Gait narrow based and stable.  He has mild unsteadiness with tandem gait, but overall a marked improvement from before.   Data: CT lumbar myelogram 02/13/2009: Status  post fusion L1-2 level without complication. Disc protrusions L2-3, L3-4 and L4-5 with findings most notable at the L4-5 level as described above.  Labs 07/23/2015:  [place] 169, vitamin B12 711, SPEP with IFE no M protein  Lab Results  Component Value Date   HGBA1C 5.9* 09/28/2015    IMPRESSION/PLAN: 1. Diabetic polyneuropathy causing sensory ataxia and numbness of the feet  - Clinically improved with PT  - Continue home PT exercises  - Praised him for doing great keeping his sugars under control  2. Bilateral proximal leg weakness most likely due to L2-4 radiculopathy, improved with PT  3. Chronic left ulnar neuropathy causing hand weakness and muscle atrophy  - EMG declined  4. Chronic low back pain with multiple back surgeries, followed by Dr.  Elsner  5. Psoaratic arthritis, followed by rheumatology   Return to clinic as needed   The duration of this appointment visit was 20 minutes of face-to-face time with the patient.  Greater than 50% of this time was spent in counseling, explanation of diagnosis, planning of further management, and coordination of care.   Thank you for allowing me to participate in patient's care.  If I can answer any additional questions, I would be pleased to do so.    Sincerely,    Donika K. Posey Pronto, DO

## 2015-10-28 NOTE — Patient Instructions (Addendum)
You look great!  Keep up with your home exercises and please use a cane for imbalance  Return to clinic as needed

## 2015-11-12 ENCOUNTER — Encounter: Payer: Self-pay | Admitting: Internal Medicine

## 2015-11-20 ENCOUNTER — Other Ambulatory Visit: Payer: Self-pay | Admitting: "Endocrinology

## 2015-11-23 DIAGNOSIS — J449 Chronic obstructive pulmonary disease, unspecified: Secondary | ICD-10-CM | POA: Diagnosis not present

## 2015-11-23 DIAGNOSIS — I5030 Unspecified diastolic (congestive) heart failure: Secondary | ICD-10-CM | POA: Diagnosis not present

## 2015-11-23 DIAGNOSIS — J9610 Chronic respiratory failure, unspecified whether with hypoxia or hypercapnia: Secondary | ICD-10-CM | POA: Diagnosis not present

## 2015-11-23 DIAGNOSIS — I4891 Unspecified atrial fibrillation: Secondary | ICD-10-CM | POA: Diagnosis not present

## 2015-11-24 ENCOUNTER — Telehealth: Payer: Self-pay | Admitting: Cardiology

## 2015-11-24 NOTE — Telephone Encounter (Signed)
K [employer] LPN faxed info to [employer] for pt's Xarelto this am

## 2015-11-25 ENCOUNTER — Other Ambulatory Visit: Payer: Self-pay

## 2015-11-25 DIAGNOSIS — R69 Illness, unspecified: Secondary | ICD-10-CM | POA: Diagnosis not present

## 2015-11-26 ENCOUNTER — Encounter: Payer: Self-pay | Admitting: Internal Medicine

## 2015-12-02 DIAGNOSIS — M5136 Other intervertebral disc degeneration, lumbar region: Secondary | ICD-10-CM | POA: Diagnosis not present

## 2015-12-02 DIAGNOSIS — L409 Psoriasis, unspecified: Secondary | ICD-10-CM | POA: Diagnosis not present

## 2015-12-02 DIAGNOSIS — M2550 Pain in unspecified joint: Secondary | ICD-10-CM | POA: Diagnosis not present

## 2015-12-02 DIAGNOSIS — M150 Primary generalized (osteo)arthritis: Secondary | ICD-10-CM | POA: Diagnosis not present

## 2015-12-02 DIAGNOSIS — M25552 Pain in left hip: Secondary | ICD-10-CM | POA: Diagnosis not present

## 2015-12-02 DIAGNOSIS — M25551 Pain in right hip: Secondary | ICD-10-CM | POA: Diagnosis not present

## 2015-12-02 DIAGNOSIS — L4050 Arthropathic psoriasis, unspecified: Secondary | ICD-10-CM | POA: Diagnosis not present

## 2015-12-03 ENCOUNTER — Encounter: Payer: Self-pay | Admitting: Internal Medicine

## 2015-12-03 ENCOUNTER — Ambulatory Visit (INDEPENDENT_AMBULATORY_CARE_PROVIDER_SITE_OTHER): Payer: Medicare HMO | Admitting: Internal Medicine

## 2015-12-03 VITALS — BP 116/62 | HR 61 | Ht 70.0 in | Wt 207.0 lb

## 2015-12-03 DIAGNOSIS — Z950 Presence of cardiac pacemaker: Secondary | ICD-10-CM

## 2015-12-03 DIAGNOSIS — I480 Paroxysmal atrial fibrillation: Secondary | ICD-10-CM

## 2015-12-03 NOTE — Patient Instructions (Signed)
Your physician wants you to follow-up in: 1 Year with Dr. Taylor. You will receive a reminder letter in the mail two months in advance. If you don't receive a letter, please call our office to schedule the follow-up appointment.  Your physician recommends that you continue on your current medications as directed. Please refer to the Current Medication list given to you today.  Remote monitoring is used to monitor your Pacemaker of ICD from home. This monitoring reduces the number of office visits required to check your device to one time per year. It allows us to keep an eye on the functioning of your device to ensure it is working properly. You are scheduled for a device check from home on 03/05/16. You may send your transmission at any time that day. If you have a wireless device, the transmission will be sent automatically. After your physician reviews your transmission, you will receive a postcard with your next transmission date.  If you need a refill on your cardiac medications before your next appointment, please call your pharmacy.  Thank you for choosing [facility]!   

## 2015-12-03 NOTE — Progress Notes (Signed)
HPI  [name] returns today for followup. He is a middle aged man with psoriasis, COPD with ongoing tobacco abuse, sinus node dysfunction and PAF, s/p PPM insertion. He denies chest pain or palpitations. He has chronic class 2 sob which is multifactorial. He has had no syncope. Minimal peripheral edema. He admits to dietary indiscretion. His psoriasis is not particularly well controlled.   Allergies  Allergen Reactions  . Nitroglycerin Hives, Swelling and Rash     Current Outpatient Prescriptions  Medication Sig Dispense Refill  . ANORO ELLIPTA 62.5-25 MCG/INH AEPB INHALE 1 PUFF ONCE DAILY 180 each 3  . Apremilast (OTEZLA) 30 MG TABS Take 30 mg by mouth 2 (two) times daily.    Marland Kitchen buPROPion (WELLBUTRIN SR) 150 MG 12 hr tablet Take 150 mg by mouth 2 (two) times daily.    . canagliflozin (INVOKANA) 300 MG TABS tablet Take 1 tablet (300 mg total) by mouth daily. 90 tablet 0  . Cholecalciferol (VITAMIN D PO) Take 1 tablet by mouth daily.    . Cyanocobalamin (VITAMIN B 12 PO) Take 1 tablet by mouth daily.    . diazepam (VALIUM) 10 MG tablet Take 10 mg by mouth every 6 (six) hours as needed for anxiety.    Marland Kitchen diltiazem (CARDIZEM CD) 180 MG 24 hr capsule Take 1 capsule (180 mg total) by mouth daily. 90 capsule 3  . escitalopram (LEXAPRO) 10 MG tablet Take 10 mg by mouth daily.    . flecainide (TAMBOCOR) 100 MG tablet TAKE 1 TABLET BY MOUTH TWICE A DAY 180 tablet 1  . furosemide (LASIX) 20 MG tablet Take 2 tablets (40 mg total) by mouth daily as needed (swelling). 60 tablet 6  . Insulin Detemir (LEVEMIR FLEXTOUCH) 100 UNIT/ML Pen Inject 60 Units into the skin at bedtime. 30 mL 2  . levothyroxine (SYNTHROID, LEVOTHROID) 112 MCG tablet TAKE 1 TABLET (112 MCG TOTAL) BY MOUTH DAILY BEFORE BREAKFAST. 90 tablet 0  . metFORMIN (GLUCOPHAGE) 1000 MG tablet TAKE 1 TABLET BY MOUTH TWICE A DAY 180 tablet 0  . Multiple Vitamin (MULTIVITAMIN) tablet Take 1 tablet by mouth daily.    . nadolol (CORGARD) 40  MG tablet Take 1 tablet (40 mg) in the morning and 1/2 (20 mg) tablet by mouth every evening 135 tablet 3  . ondansetron (ZOFRAN) 4 MG tablet Take 1 tablet (4 mg total) by mouth every 8 (eight) hours as needed for nausea or vomiting. 40 tablet 0  . ONETOUCH VERIO test strip USE TO TEST BLOOD SUGAR FOUR TIMES A DAY 150 each 5  . OXYGEN Inhale 2 L into the lungs at bedtime.    . pantoprazole (PROTONIX) 40 MG tablet Take 1 tablet (40 mg total) by mouth daily. 90 tablet 5  . rivaroxaban (XARELTO) 20 MG TABS tablet TAKE 1 TABLET BY MOUTH EVERY DAY WITH DINNER 90 tablet 3  . simvastatin (ZOCOR) 20 MG tablet TAKE 1 TABLET (20 MG TOTAL) BY MOUTH EVERY EVENING. 90 tablet 3  . spironolactone (ALDACTONE) 50 MG tablet TAKE 1 TABLET (50 MG TOTAL) BY MOUTH 2 (TWO) TIMES DAILY. 180 tablet 3  . traMADol (ULTRAM) 50 MG tablet Take 50 mg by mouth every 6 (six) hours as needed.     No current facility-administered medications for this visit.      Past Medical History:  Diagnosis Date  . Anginal pain ([place])   . Arthritis    "back; fingers" (01/06/2012)  . Atrial flutter ([place])  s/p EPS +RF ablation of typical atrial flutter April 2015  . Cancer ([place])    skin cancer  . CHB (complete heart block) ([place])   . CHF (congestive heart failure) ([place]) 01/06/2012  . Chronic lower back pain   . Cirrhosis ([place])    NASH-Hep A and B immune  . Coughing up blood    "comes from my throat" (01/06/2012)  . DDD (degenerative disc disease), lumbar   . Depressed   . Difficult intubation    Eschmann stylet used in 2002 and 2007; "trouble waking up afterwards" (01/06/2012)  . Emphysema   . Fatty liver disease, nonalcoholic   . GERD (gastroesophageal reflux disease)   . H/O hiatal hernia   . Headache   . Hypertension   . Hypothyroidism   . Pacemaker   . Pneumonia Aug 2016  . Presence of permanent cardiac pacemaker 9/292013   St.Jude  . Sinus pause 01/06/2012   5.2 seconds  . Sleep apnea    "don't wear mask" (01/06/2012)    . Stroke [facility])    pt states that he might have had a stroke not sure  . Type II diabetes mellitus ([place])   . Varicose vein    of esophagus    ROS:   All systems reviewed and negative except as noted in the HPI.   Past Surgical History:  Procedure Laterality Date  . ATRIAL FLUTTER ABLATION N/A 07/10/2013   Procedure: ATRIAL FLUTTER ABLATION;  Surgeon: Evans Lance, MD;  Location[facility] C[facility];  Service: Cardiovascular;  Laterality: N/A;  . BACK SURGERY    . CHOLECYSTECTOMY  1993  . COLONOSCOPY  11/08/2004   [id number] rectum, colon, TI.  Marland Kitchen COLONOSCOPY N/A 05/28/2014   Dr. Gala Romney: Redundant colon. single colonic polyp removed as described above. Tubular adenoma  . ESOPHAGEAL DILATION N/A 05/28/2014   Procedure: ESOPHAGEAL DILATION;  Surgeon: Daneil Dolin, MD;  Location: [facility]O [facility];  Service: Endoscopy;  Laterality: N/A;  . ESOPHAGOGASTRODUODENOSCOPY  11/08/2004   [id number] esophageal erosions consistent with erosive reflux esophagitis/Areas of hemorrhage and nodularity of the fundal mucosa of uncertain significance, biopsied.  Small hiatal hernia, otherwise normal stomach  . ESOPHAGOGASTRODUODENOSCOPY  2010   Dr. Gala Romney: 3 columns Grade 1 varices, erosive esophagitis, HH, portal gastropathy, normal D1, D2  . ESOPHAGOGASTRODUODENOSCOPY N/A 05/28/2014   Dr. Gala Romney: MIld erosive reflux esophagitis. Grade 1 esophageal varices. Patent esophagus. No dilation performed. Hiatal hernia.   Marland Kitchen ESOPHAGOGASTRODUODENOSCOPY (EGD) WITH ESOPHAGEAL DILATION N/A 02/14/2013   [id number] 1 esophageal varices. Abnormal distal esophagus/status post biopsy after Maloney dilation. Portal gastropathy. Antral erosions-status post biopsy. path negative for H.pylori, benign path.  . LUMBAR [place]; ~ 1995; ~ 1996  . NASAL SEPTUM SURGERY  1992  . nuclear stress test  10/19/2004   No ischemia  . PERMANENT PACEMAKER INSERTION  01/08/2012   CHB  . PERMANENT PACEMAKER INSERTION N/A 01/09/2012   Procedure:  PERMANENT PACEMAKER INSERTION;  Surgeon: Sanda Klein, MD;  Location: [place] CATH LAB;  Service: Cardiovascular;  Laterality: N/A;  . POSTERIOR FUSION LUMBAR SPINE  1999   L4-5  . SPINAL CORD STIMULATOR IMPLANT  2006  . SPINAL CORD STIMULATOR REMOVAL N/A 01/27/2015   Procedure: LUMBAR SPINAL CORD STIMULATOR REMOVAL;  Surgeon: Kristeen Miss, MD;  Location: [place] NEURO ORS;  Service: Neurosurgery;  Laterality: N/A;  LUMBAR SPINAL CORD STIMULATOR REMOVAL  . TONSILLECTOMY AND ADENOIDECTOMY  1992  . US ECHOCARDIOGRAPHY  12/28/2011   mild LVH,mild mitral annulara ca+,mild MR  .  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 Cancer Mother     Deceased, 3  . Ovarian cancer Mother   . Arrhythmia Father   . Other Father     Deceased 23  . Stroke Brother   . Stroke Brother   . Stroke Sister   . Crohn's disease Daughter      Social History   Social History  . Marital status: Married    Spouse name: N/A  . Number of children: N/A  . Years of education: N/A   Occupational History  . Not on file.   Social History Main Topics  . Smoking status: Current Every Day Smoker    Packs/day: 0.25    Years: 45.00    Types: Cigarettes    Start date: 04/11/1968    Last attempt to quit: 04/12/2015  . Smokeless tobacco: Never Used     Comment: Quit x 8 months this time  . Alcohol use No     Comment: "quit alcohol 2011" Previously drinking socially about twice per month  . Drug use: No  . Sexual activity: No   Other Topics Concern  . Not on file   Social History Narrative   Lives with wife in a one story home.  Has 3 children.     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     Education: some college.     BP 116/62   Pulse 61   Ht 5\' 10"  (1.778 m)   Wt 207 lb (93.9 kg)   SpO2 94%   BMI 29.70 kg/m   Physical Exam:  stable appearing middle aged man, NAD HEENT: Unremarkable except wearing [place] by face mask. Neck:  6 cm JVD, no  thyromegally Back:  No CVA tenderness Lungs:  Clear with no wheezes or rhonchi HEART:  Regular rate rhythm, no murmurs, no rubs, no clicks Abd:  soft, positive bowel sounds, no organomegally, no rebound, no guarding Ext:  2 plus pulses, no edema, no cyanosis, no clubbing Skin:  No rashes no nodules Neuro:  CN II through XII intact, motor grossly intact   DEVICE  Normal device function.  See PaceArt for details.   Assess/Plan: 1. PAF - he is maintaining NSR. No change in meds 2. Sinus node dysfunction - he is s/p PPM and is pacing 99% in the atrium 3. PPM - his St. Jude DDD PM is working normally. Will recheck in several months.  Mikle Bosworth.D.

## 2015-12-04 ENCOUNTER — Other Ambulatory Visit: Payer: Self-pay | Admitting: "Endocrinology

## 2015-12-09 ENCOUNTER — Telehealth: Payer: Self-pay | Admitting: Internal Medicine

## 2015-12-09 ENCOUNTER — Other Ambulatory Visit: Payer: Self-pay

## 2015-12-09 DIAGNOSIS — K7460 Unspecified cirrhosis of liver: Secondary | ICD-10-CM

## 2015-12-09 NOTE — Telephone Encounter (Signed)
Order in. Letter mailed. 

## 2015-12-09 NOTE — Telephone Encounter (Signed)
Pt on recall for ultrasound

## 2015-12-11 DIAGNOSIS — E785 Hyperlipidemia, unspecified: Secondary | ICD-10-CM | POA: Diagnosis not present

## 2015-12-11 LAB — LIPID PANEL
Cholesterol: 111 mg/dL — ABNORMAL LOW (ref 125–200)
HDL: 30 mg/dL — ABNORMAL LOW (ref >=40)
LDL Cholesterol: 26 mg/dL (ref <130)
Total CHOL/HDL Ratio: 3.7 Ratio (ref <=5.0)
Triglycerides: 276 mg/dL — ABNORMAL HIGH (ref <150)
VLDL: 55 mg/dL — ABNORMAL HIGH (ref <30)

## 2015-12-15 ENCOUNTER — Other Ambulatory Visit: Payer: Self-pay | Admitting: "Endocrinology

## 2015-12-16 ENCOUNTER — Telehealth: Payer: Self-pay

## 2015-12-16 NOTE — Telephone Encounter (Signed)
Called pt., no answer left message for pt. To return call.  

## 2015-12-16 NOTE — Telephone Encounter (Signed)
-----   Message from Arnoldo Lenis, MD sent at 12/16/2015  1:02 PM EDT ----- Cholesterol labs show his triglycerides are too high and his good cholesterol is low. These are best improved with cutting down on fatty food and sweets, exercise and weight loss. The most important number, the bad cholesterol/LDL is at a very good level.  Zandra Abts MD

## 2015-12-22 ENCOUNTER — Ambulatory Visit: Payer: Medicare HMO | Admitting: Internal Medicine

## 2015-12-24 DIAGNOSIS — J9610 Chronic respiratory failure, unspecified whether with hypoxia or hypercapnia: Secondary | ICD-10-CM | POA: Diagnosis not present

## 2015-12-24 DIAGNOSIS — I4891 Unspecified atrial fibrillation: Secondary | ICD-10-CM | POA: Diagnosis not present

## 2015-12-24 DIAGNOSIS — I5030 Unspecified diastolic (congestive) heart failure: Secondary | ICD-10-CM | POA: Diagnosis not present

## 2015-12-24 DIAGNOSIS — J449 Chronic obstructive pulmonary disease, unspecified: Secondary | ICD-10-CM | POA: Diagnosis not present

## 2015-12-28 DIAGNOSIS — Z6831 Body mass index (BMI) 31.0-31.9, adult: Secondary | ICD-10-CM | POA: Diagnosis not present

## 2015-12-28 DIAGNOSIS — K7460 Unspecified cirrhosis of liver: Secondary | ICD-10-CM | POA: Diagnosis not present

## 2015-12-28 DIAGNOSIS — I5031 Acute diastolic (congestive) heart failure: Secondary | ICD-10-CM | POA: Diagnosis not present

## 2015-12-28 DIAGNOSIS — I10 Essential (primary) hypertension: Secondary | ICD-10-CM | POA: Diagnosis not present

## 2015-12-28 DIAGNOSIS — I4891 Unspecified atrial fibrillation: Secondary | ICD-10-CM | POA: Diagnosis not present

## 2015-12-28 DIAGNOSIS — E782 Mixed hyperlipidemia: Secondary | ICD-10-CM | POA: Diagnosis not present

## 2015-12-28 DIAGNOSIS — K760 Fatty (change of) liver, not elsewhere classified: Secondary | ICD-10-CM | POA: Diagnosis not present

## 2015-12-28 DIAGNOSIS — Z139 Encounter for screening, unspecified: Secondary | ICD-10-CM | POA: Diagnosis not present

## 2015-12-28 DIAGNOSIS — E6609 Other obesity due to excess calories: Secondary | ICD-10-CM | POA: Diagnosis not present

## 2015-12-29 ENCOUNTER — Encounter: Payer: Self-pay | Admitting: Internal Medicine

## 2015-12-29 ENCOUNTER — Ambulatory Visit (INDEPENDENT_AMBULATORY_CARE_PROVIDER_SITE_OTHER): Payer: Medicare HMO | Admitting: Internal Medicine

## 2015-12-29 VITALS — BP 108/65 | HR 79 | Temp 97.6°F | Ht 70.0 in | Wt 205.0 lb

## 2015-12-29 DIAGNOSIS — R69 Illness, unspecified: Secondary | ICD-10-CM | POA: Diagnosis not present

## 2015-12-29 DIAGNOSIS — D126 Benign neoplasm of colon, unspecified: Secondary | ICD-10-CM | POA: Diagnosis not present

## 2015-12-29 DIAGNOSIS — R1314 Dysphagia, pharyngoesophageal phase: Secondary | ICD-10-CM

## 2015-12-29 DIAGNOSIS — K7030 Alcoholic cirrhosis of liver without ascites: Secondary | ICD-10-CM | POA: Diagnosis not present

## 2015-12-29 NOTE — Progress Notes (Signed)
Primary Care Physician:  Purvis Kilts, MD Primary Gastroenterologist:  Dr. Gala Romney  Pre-Procedure History & Physical: HPI:  Ralph [name] is a 65 y.o. male here for of Nash/ASH cirrhosis. Grade 1 esophageal varices - on Nadolol.  Remains anticoagulated on Xarelto. Oropharyngeal dysfunction evaluated by speech therapy. Chin tuck method associated with better swallowing. GERD well-controlled on Protonix 40 mg daily. Due for hepatoma screening next month.  History of colonic adenoma; due for a surveillance colonoscopy 2021.  Past Medical History:  Diagnosis Date  . Anginal pain ([place])   . Arthritis    "back; fingers" (01/06/2012)  . Atrial flutter [facility])    s/p EPS +RF ablation of typical atrial flutter April 2015  . Cancer ([place])    skin cancer  . CHB (complete heart block) ([place])   . CHF (congestive heart failure) ([place]) 01/06/2012  . Chronic lower back pain   . Cirrhosis ([place])    NASH-Hep A and B immune  . Coughing up blood    "comes from my throat" (01/06/2012)  . DDD (degenerative disc disease), lumbar   . Depressed   . Difficult intubation    Eschmann stylet used in 2002 and 2007; "trouble waking up afterwards" (01/06/2012)  . Emphysema   . Fatty liver disease, nonalcoholic   . GERD (gastroesophageal reflux disease)   . H/O hiatal hernia   . Headache   . Hypertension   . Hypothyroidism   . Pacemaker   . Pneumonia Aug 2016  . Presence of permanent cardiac pacemaker 9/292013   [place]  . Sinus pause 01/06/2012   5.2 seconds  . Sleep apnea    "don't wear mask" (01/06/2012)  . Stroke [facility])    pt states that he might have had a stroke not sure  . Type II diabetes mellitus ([place])   . Varicose vein    of esophagus    Past Surgical History:  Procedure Laterality Date  . ATRIAL FLUTTER ABLATION N/A 07/10/2013   Procedure: ATRIAL FLUTTER ABLATION;  Surgeon: Evans Lance, MD;  Location: [facility] CATH LAB;  Service: Cardiovascular;  Laterality: N/A;  . BACK SURGERY    .  CHOLECYSTECTOMY  1993  . COLONOSCOPY  11/08/2004   [id number] rectum, colon, TI.  Marland Kitchen COLONOSCOPY N/A 05/28/2014   Dr. Gala Romney: Redundant colon. single colonic polyp removed as described above. Tubular adenoma  . ESOPHAGEAL DILATION N/A 05/28/2014   Procedure: ESOPHAGEAL DILATION;  Surgeon: Daneil Dolin, MD;  Location: [facility] ENDO [facility]ITE;  Service: Endoscopy;  Laterality: N/A;  . ESOPHAGOGASTRODUODENOSCOPY  11/08/2004   [id number] esophageal erosions consistent with erosive reflux esophagitis/Areas of hemorrhage and nodularity of the fundal mucosa of uncertain significance, biopsied.  Small hiatal hernia, otherwise normal stomach  . ESOPHAGOGASTRODUODENOSCOPY  2010   Dr. Gala Romney: 3 columns Grade 1 varices, erosive esophagitis, HH, portal gastropathy, normal D1, D2  . ESOPHAGOGASTRODUODENOSCOPY N/A 05/28/2014   Dr. Gala Romney: MIld erosive reflux esophagitis. Grade 1 esophageal varices. Patent esophagus. No dilation performed. Hiatal hernia.   Marland Kitchen ESOPHAGOGASTRODUODENOSCOPY (EGD) WITH ESOPHAGEAL DILATION N/A 02/14/2013   [id number] 1 esophageal varices. Abnormal distal esophagus/status post biopsy after Maloney dilation. Portal gastropathy. Antral erosions-status post biopsy. path negative for H.pylori, benign path.  . LUMBAR [place]; ~ 1995; ~ 1996  . NASAL SEPTUM SURGERY  1992  . nuclear stress test  10/19/2004   No ischemia  . PERMANENT PACEMAKER INSERTION  01/08/2012   CHB  . PERMANENT PACEMAKER INSERTION N/A 01/09/2012   Procedure: PERMANENT  PACEMAKER INSERTION;  Surgeon: Sanda Klein, MD;  Location: [facility] CATH LAB;  Service: Cardiovascular;  Laterality: N/A;  . POSTERIOR FUSION LUMBAR SPINE  1999   L4-5  . SPINAL CORD STIMULATOR IMPLANT  2006  . SPINAL CORD STIMULATOR REMOVAL N/A 01/27/2015   Procedure: LUMBAR SPINAL CORD STIMULATOR REMOVAL;  Surgeon: Kristeen Miss, MD;  Location: [facility] NEURO ORS;  Service: Neurosurgery;  Laterality: N/A;  LUMBAR SPINAL CORD STIMULATOR REMOVAL  . TONSILLECTOMY AND  ADENOIDECTOMY  1992  . US ECHOCARDIOGRAPHY  12/28/2011   mild LVH,mild mitral annulara ca+,mild MR  . 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 Authorizing Provider  ANORO ELLIPTA 62.5-25 MCG/INH AEPB INHALE 1 PUFF ONCE DAILY 01/19/15  Yes Collene Gobble, MD  Apremilast (OTEZLA) 30 MG TABS Take 30 mg by mouth 2 (two) times daily.   Yes Historical Provider, MD  buPROPion (WELLBUTRIN SR) 150 MG 12 hr tablet Take 150 mg by mouth 2 (two) times daily.   Yes Historical Provider, MD  canagliflozin (INVOKANA) 300 MG TABS tablet Take 1 tablet (300 mg total) by mouth daily. 10/14/15  Yes Cassandria Anger, MD  Cholecalciferol (VITAMIN D PO) Take 1 tablet by mouth daily.   Yes Historical Provider, MD  Cyanocobalamin (VITAMIN B 12 PO) Take 1 tablet by mouth daily.   Yes Historical Provider, MD  diazepam (VALIUM) 10 MG tablet Take 10 mg by mouth every 6 (six) hours as needed for anxiety.   Yes Historical Provider, MD  diltiazem (CARDIZEM CD) 180 MG 24 hr capsule Take 1 capsule (180 mg total) by mouth daily. 04/23/15  Yes Arnoldo Lenis, MD  escitalopram (LEXAPRO) 10 MG tablet Take 10 mg by mouth daily.   Yes Historical Provider, MD  flecainide (TAMBOCOR) 100 MG tablet TAKE 1 TABLET BY MOUTH TWICE A DAY 10/05/15  Yes Evans Lance, MD  furosemide (LASIX) 20 MG tablet Take 2 tablets (40 mg total) by mouth daily as needed (swelling). 08/27/15  Yes Arnoldo Lenis, MD  Insulin Detemir (LEVEMIR FLEXTOUCH) 100 UNIT/ML Pen Inject 60 Units into the skin at bedtime. 10/19/15  Yes Cassandria Anger, MD  metFORMIN (GLUCOPHAGE) 1000 MG tablet TAKE 1 TABLET BY MOUTH TWICE A DAY 12/15/15  Yes Cassandria Anger, MD  Multiple Vitamin (MULTIVITAMIN) tablet Take 1 tablet by mouth daily.   Yes Historical Provider, MD  nadolol (CORGARD) 40 MG tablet Take 1 tablet (40 mg) in the morning and 1/2 (20 mg) tablet by mouth every evening 08/19/15  Yes  Mahala Menghini, PA-C  ondansetron (ZOFRAN) 4 MG tablet Take 1 tablet (4 mg total) by mouth every 8 (eight) hours as needed for nausea or vomiting. 07/21/14  Yes Mahala Menghini, PA-C  ONETOUCH VERIO test strip USE TO TEST BLOOD SUGAR FOUR TIMES A DAY 11/20/15  Yes Cassandria Anger, MD  OXYGEN Inhale 2 L into the lungs at bedtime.   Yes Historical Provider, MD  pantoprazole (PROTONIX) 40 MG tablet Take 1 tablet (40 mg total) by mouth daily. 10/20/14  Yes Annitta Needs, NP  rivaroxaban (XARELTO) 20 MG TABS tablet TAKE 1 TABLET BY MOUTH EVERY DAY WITH DINNER 10/20/15  Yes Arnoldo Lenis, MD  simvastatin (ZOCOR) 20 MG tablet TAKE 1 TABLET (20 MG TOTAL) BY MOUTH EVERY EVENING. 04/30/15  Yes Arnoldo Lenis, MD  spironolactone (ALDACTONE) 50 MG tablet TAKE 1 TABLET (50 MG TOTAL)  BY MOUTH 2 (TWO) TIMES DAILY. 07/29/15  Yes Mahala Menghini, PA-C  SYNTHROID 112 MCG tablet TAKE 1 TABLET (112 MCG TOTAL) BY MOUTH DAILY BEFORE BREAKFAST. 12/04/15  Yes Cassandria Anger, MD  traMADol (ULTRAM) 50 MG tablet Take 50 mg by mouth every 6 (six) hours as needed.   Yes Historical Provider, MD    Allergies as of 12/29/2015 - Review Complete 12/29/2015  Allergen Reaction Noted  . Nitroglycerin Hives, Swelling, and Rash     Family History  Problem Relation Age of Onset  . Cancer Mother     Deceased, 72  . Ovarian cancer Mother   . Arrhythmia Father   . Other Father     Deceased 62  . Stroke Brother   . Stroke Brother   . Stroke Sister   . Crohn's disease Daughter     Social History   Social History  . Marital status: Married    Spouse name: N/A  . Number of children: N/A  . Years of education: N/A   Occupational History  . Not on file.   Social History Main Topics  . Smoking status: Current Every Day Smoker    Packs/day: 0.25    Years: 45.00    Types: Cigarettes    Start date: 04/11/1968    Last attempt to quit: 04/12/2015  . Smokeless tobacco: Never Used     Comment: Quit x 8 months this time   . Alcohol use No     Comment: "quit alcohol 2011" Previously drinking socially about twice per month  . Drug use: No  . Sexual activity: No   Other Topics Concern  . Not on file   Social History Narrative   Lives with wife in a one story home.  Has 3 children.     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     Education: some college.    Review of Systems: See HPI, otherwise negative ROS  Physical Exam: BP 108/65   Pulse 79   Temp 97.6 F (36.4 C) (Oral)   Ht 5\' 10"  (1.778 m)   Wt 205 lb (93 kg)   BMI 29.41 kg/m  General:   Alert,  , pleasant and cooperative in NAD Skin:  Intact without significant lesions or rashes. Eyes:  Sclera clear, no icterus.   Conjunctiva pink. Neck:  Supple; no masses or thyromegaly. No significant cervical adenopathy. Lungs:  Clear throughout to auscultation.   No wheezes, crackles, or rhonchi. No acute distress. Heart:  Regular rate and rhythm; no murmurs, clicks, rubs,  or gallops. Abdomen: Non-distended, normal bowel sounds.  Soft and nontender without appreciable mass or hepatosplenomegaly.  Pulses:  Normal pulses noted. Extremities:  Without clubbing or edema.  Impression:  ASH-/Nash cirrhosis. Remains well compensated at this time. Or pharyngeal dysphagia symptoms managed with therapeutic recommendations from speech pathology. Symptoms.    GERD well-controlled on Protonix. History of colonic adenoma; due for surveillance 2021.  Recommendations:  Continue Nadolol daily  Plan to get a liver ultrasound every 6 months  Continue Protonix daily  Office visit in 6 months    Notice: This dictation was prepared with Dragon dictation along with smaller phrase technology. Any transcriptional errors that result from this process are unintentional and may not be corrected upon review.

## 2015-12-29 NOTE — Patient Instructions (Addendum)
Continue Nadolol daily  Plan to get a liver ultrasound every 6 months  Continue Protonix daily  Office visit in 6 months

## 2016-01-01 ENCOUNTER — Other Ambulatory Visit: Payer: Self-pay | Admitting: Gastroenterology

## 2016-01-04 ENCOUNTER — Other Ambulatory Visit: Payer: Self-pay | Admitting: "Endocrinology

## 2016-01-04 DIAGNOSIS — E118 Type 2 diabetes mellitus with unspecified complications: Secondary | ICD-10-CM | POA: Diagnosis not present

## 2016-01-04 DIAGNOSIS — E1165 Type 2 diabetes mellitus with hyperglycemia: Secondary | ICD-10-CM | POA: Diagnosis not present

## 2016-01-04 DIAGNOSIS — Z794 Long term (current) use of insulin: Secondary | ICD-10-CM | POA: Diagnosis not present

## 2016-01-04 LAB — COMPLETE METABOLIC PANEL WITH GFR
ALT: 32 U/L (ref 9–46)
AST: 29 U/L (ref 10–35)
Albumin: 4.3 g/dL (ref 3.6–5.1)
Alkaline Phosphatase: 172 U/L — ABNORMAL HIGH (ref 40–115)
BILIRUBIN TOTAL: 0.5 mg/dL (ref 0.2–1.2)
BUN: 29 mg/dL — ABNORMAL HIGH (ref 7–25)
CHLORIDE: 99 mmol/L (ref 98–110)
CO2: 28 mmol/L (ref 20–31)
CREATININE: 0.94 mg/dL (ref 0.70–1.25)
Calcium: 9.2 mg/dL (ref 8.6–10.3)
GFR, Est African American: >89 mL/min (ref >=60)
GFR, Est Non African American: 87 mL/min (ref >=60)
GLUCOSE: 113 mg/dL — AB (ref 65–99)
Potassium: 4.7 mmol/L (ref 3.5–5.3)
SODIUM: 138 mmol/L (ref 135–146)
TOTAL PROTEIN: 6.9 g/dL (ref 6.1–8.1)

## 2016-01-04 LAB — HEMOGLOBIN A1C
Hgb A1c MFr Bld: 5.7 % — ABNORMAL HIGH (ref <5.7)
MEAN PLASMA GLUCOSE: 117 mg/dL

## 2016-01-06 ENCOUNTER — Encounter: Payer: Self-pay | Admitting: Emergency Medicine

## 2016-01-06 ENCOUNTER — Other Ambulatory Visit: Payer: Self-pay | Admitting: "Endocrinology

## 2016-01-06 ENCOUNTER — Ambulatory Visit (INDEPENDENT_AMBULATORY_CARE_PROVIDER_SITE_OTHER): Payer: Medicare HMO | Admitting: Emergency Medicine

## 2016-01-06 DIAGNOSIS — G4734 Idiopathic sleep related nonobstructive alveolar hypoventilation: Secondary | ICD-10-CM | POA: Diagnosis not present

## 2016-01-06 DIAGNOSIS — J410 Simple chronic bronchitis: Secondary | ICD-10-CM | POA: Diagnosis not present

## 2016-01-06 NOTE — Assessment & Plan Note (Signed)
Please continue your Anoro  Keep albuterol available to take 2 puffs up to every 4 hours if needed for shortness of breath.  Flu shot today Work on stopping smoking Wear your oxygen at night Follow with Dr Lamonte Sakai in 6 months or sooner if you have any problems

## 2016-01-06 NOTE — Patient Instructions (Signed)
Please continue your Anoro  Keep albuterol available to take 2 puffs up to every 4 hours if needed for shortness of breath.  Flu shot today Work on stopping smoking Wear your oxygen at night Follow with Dr Lamonte Sakai in 6 months or sooner if you have any problems

## 2016-01-06 NOTE — Progress Notes (Signed)
Subjective:    Patient ID: Ralph [name], male    DOB: March 22, 1951, 65 y.o.   MRN: [id number]  HPI 65 yo man, former smoker (130pk-yrs), HTN, cirrhosis w varices, OSA not on CPAP, DM, A flutter s/p ablation and pacer, hiatal hernia. He has dysphagia. Has been dx with COPD in 2014 by Dr Luan Pulling. Wears O2 at 2L/min since Summer 2014. He is on Dulera + ProAir prn. He isn't sure they help him. He was once on Spiriva, didn't feels it helped. He has dyspnea that can happen at rest or with exertion. He has nasal congestion and allergies.    PFT 04/2011 > suggests mixed disease, moderate AFL, no BD response, normal volumes, decreased DLCO.   ROV 10/15/13 -- hx COPD, OSA, restrictive dz. Last time we tried changing dulera to anoro, there was not a big difference with the new medication. He uses albuterol 2-3 times a day. He is still having apneic episodes. Waking up in resp distress.  He uses O2 with exertion, wants a portable concentrator. He has a new psoriatic lesion on L knee.   ROV 12/19/13 -- follow up visit for COPD, OSA, restrictive dz.  He underwent PSG, final read not yet available but suspect negative for OSA.  Has daily cough, green mucous. Has good days and bad days. No real wheeze. He uses albuterol about 2 x a day. Dermatology evaluated his psoriasis > steroid cream.   ROV 12/23/14 -- follow-up visit for COPD and restrictive lung disease. He underwent a polysomnogram 12/21/13 that did not show any evidence for obstructive or central sleep apnea. He was maintained on 2 L/m of oxygen without desaturation. He has undergone ablation since our last visit, has made him feel much better. He is on Anoro daily, tolerates it well. Uses albuterol very rarely. He was given abx and pred this summer by Dr Hilma Favors.   ROV 06/22/15 -- patient follows up for history of mixed obstruction and restriction, COPD. He has nocturnal hypoxemia but no evidence of sleep apnea on polysomnogram. He has been managed on Anoro >    Tolerates well. His breathing has been fairly good, he occasionally feels that his chest is heavy, no overt CP, that does make breathing difficult. May be helped some by SABA - difficult to tell. Not always associated w exertion. He is a bit unsteady. No wheezing, no cough.   ROV 01/06/16 -- this is a follow-up visit for history of restrictive lung disease with superimposed COPD. Nocturnal hypoxemia in absence of sleep apnea. He is currently managed on Anoro. Has had a good six months except for back pain. Stops him before breathing. Minimal cough. No flares. He does have some dysphagia. Tolerates anoro. No SABA use. Continues to smoke about 5 cigarettes a day.    Review of Systems  Constitutional: Negative for fever and unexpected weight change.  HENT: Negative for congestion, dental problem, ear pain, nosebleeds, postnasal drip, rhinorrhea, sinus pressure, sneezing, sore throat and trouble swallowing.   Eyes: Negative for redness and itching.  Respiratory: Negative for cough, chest tightness, shortness of breath and wheezing.   Cardiovascular: Negative for chest pain, palpitations and leg swelling.  Gastrointestinal: Negative for nausea and vomiting.  Genitourinary: Negative for dysuria.  Musculoskeletal: Negative for joint swelling.  Skin: Negative for rash.  Neurological: Negative for headaches.       Some unsteadiness, imbalance.   Hematological: Does not bruise/bleed easily.  Psychiatric/Behavioral: Negative for dysphoric mood. The patient is not nervous/anxious.  Objective:   Physical Exam Vitals:   01/06/16 1627  BP: 112/60  BP Location: Left Arm  Cuff Size: Normal  Pulse: 60  SpO2: 96%  Weight: 205 lb (93 kg)  Height: 5\' 10"  (1.778 m)   Gen: Pleasant, obese, in no distress,  normal affect on O2  ENT: No lesions,  mouth clear,  oropharynx clear, no postnasal drip  Neck: No JVD, no TMG, no carotid bruits  Lungs: No use of accessory muscles, clear without rales or  rhonchi  Cardiovascular: RRR, heart sounds normal, no murmur or gallops, trace peripheral edema  Musculoskeletal: No deformities, no cyanosis or clubbing  Neuro: alert, non focal  Skin: Warm, no lesions or rashes    11/21/12 --  Study Conclusions - Left ventricle: The cavity size was normal. Wall thickness was increased in a pattern of mild LVH. There was moderate asymmetric hypertrophy of the septum. Systolic function was normal. The estimated ejection fraction was in the range of 60% to 65%. Wall motion was normal; there were no regional wall motion abnormalities. The study is not technically sufficient to allow evaluation of LV diastolic function. - Mitral valve: Trivial regurgitation. - Left atrium: The atrium was mildly dilated. - Right ventricle: The cavity size was mildly to moderately dilated. Pacer wire or catheter noted in right ventricle. Systolic function was normal. - Right atrium: Central venous pressure: 37mm Hg (est). - Tricuspid valve: Trivial regurgitation. - Pulmonary arteries: PA peak pressure: 48mm Hg (S). - Pericardium, extracardiac: There was no pericardial effusion. Impressions:  - No prior study available for comparison. Mild LVH with moderate septal hypertrophy, LVEF 60-65%. indeterminate diastolic function. Mild left atrial enlargement. Mild to moderate RV enlargement, device wire noted. Normal PASP and CVP. No pericardial effusion.     Assessment & Plan:  COPD (chronic obstructive pulmonary disease) Please continue your Anoro  Keep albuterol available to take 2 puffs up to every 4 hours if needed for shortness of breath.  Flu shot today Work on stopping smoking Wear your oxygen at night Follow with Dr Lamonte Sakai in 6 months or sooner if you have any problems  Nocturnal hypoxemia O2 at night as ordered. No OSA on PSG  Baltazar Apo, MD, PhD 01/06/2016, 4:54 PM [facility] [phone] or if no answer [phone]

## 2016-01-06 NOTE — Assessment & Plan Note (Signed)
O2 at night as ordered. No OSA on PSG

## 2016-01-07 DIAGNOSIS — R69 Illness, unspecified: Secondary | ICD-10-CM | POA: Diagnosis not present

## 2016-01-11 ENCOUNTER — Ambulatory Visit (HOSPITAL_COMMUNITY)
Admission: RE | Admit: 2016-01-11 | Discharge: 2016-01-11 | Disposition: A | Discharge status: Home / Self Care | Payer: Medicare HMO | Source: Ambulatory Visit | Attending: Internal Medicine | Admitting: Internal Medicine

## 2016-01-11 DIAGNOSIS — K7460 Unspecified cirrhosis of liver: Secondary | ICD-10-CM

## 2016-01-13 ENCOUNTER — Ambulatory Visit: Payer: Medicare HMO | Admitting: "Endocrinology

## 2016-01-13 NOTE — Progress Notes (Signed)
ON RECALL  °

## 2016-01-14 ENCOUNTER — Telehealth: Payer: Self-pay

## 2016-01-14 DIAGNOSIS — M549 Dorsalgia, unspecified: Secondary | ICD-10-CM | POA: Diagnosis not present

## 2016-01-14 DIAGNOSIS — Z6829 Body mass index (BMI) 29.0-29.9, adult: Secondary | ICD-10-CM | POA: Diagnosis not present

## 2016-01-14 DIAGNOSIS — T85192A Other mechanical complication of implanted electronic neurostimulator (electrode) of spinal cord, initial encounter: Secondary | ICD-10-CM | POA: Diagnosis not present

## 2016-01-14 NOTE — Telephone Encounter (Signed)
Pt wants to know if his cirrhosis is what has caused his raised triglycerides levels?

## 2016-01-15 NOTE — Telephone Encounter (Signed)
Sent pt a message via my chart :)

## 2016-01-15 NOTE — Telephone Encounter (Signed)
More likely related to genetics, diabetes and lifestyle-lack of exercise, diet, etc.

## 2016-01-19 ENCOUNTER — Ambulatory Visit (INDEPENDENT_AMBULATORY_CARE_PROVIDER_SITE_OTHER): Payer: Medicare HMO | Admitting: "Endocrinology

## 2016-01-19 ENCOUNTER — Encounter: Payer: Self-pay | Admitting: "Endocrinology

## 2016-01-19 VITALS — BP 128/72 | HR 88 | Ht 70.0 in | Wt 209.0 lb

## 2016-01-19 DIAGNOSIS — E038 Other specified hypothyroidism: Secondary | ICD-10-CM | POA: Diagnosis not present

## 2016-01-19 DIAGNOSIS — Z6830 Body mass index (BMI) 30.0-30.9, adult: Secondary | ICD-10-CM | POA: Diagnosis not present

## 2016-01-19 DIAGNOSIS — E1165 Type 2 diabetes mellitus with hyperglycemia: Secondary | ICD-10-CM | POA: Diagnosis not present

## 2016-01-19 DIAGNOSIS — IMO0002 Reserved for concepts with insufficient information to code with codable children: Secondary | ICD-10-CM

## 2016-01-19 DIAGNOSIS — Z794 Long term (current) use of insulin: Secondary | ICD-10-CM

## 2016-01-19 DIAGNOSIS — I10 Essential (primary) hypertension: Secondary | ICD-10-CM | POA: Diagnosis not present

## 2016-01-19 DIAGNOSIS — E1159 Type 2 diabetes mellitus with other circulatory complications: Secondary | ICD-10-CM | POA: Diagnosis not present

## 2016-01-19 DIAGNOSIS — E782 Mixed hyperlipidemia: Secondary | ICD-10-CM

## 2016-01-19 DIAGNOSIS — E6609 Other obesity due to excess calories: Secondary | ICD-10-CM | POA: Diagnosis not present

## 2016-01-19 MED ORDER — CANAGLIFLOZIN 100 MG PO TABS: 100.0000 mg | ORAL_TABLET | Freq: Every day | ORAL | 3 refills | Status: DC

## 2016-01-19 MED ORDER — INSULIN DETEMIR 100 UNIT/ML FLEXPEN: 50.0000 [IU] | PEN_INJECTOR | Freq: Every day | SUBCUTANEOUS | 2 refills | Status: DC

## 2016-01-19 NOTE — Progress Notes (Signed)
Subjective:    Patient ID: Ralph [name], male    DOB: 01/03/1951, PCP Purvis Kilts, MD   Past Medical History:  Diagnosis Date  . Anginal pain ([place])   . Arthritis    "back; fingers" (01/06/2012)  . Atrial flutter [facility])    s/p EPS +RF ablation of typical atrial flutter April 2015  . Cancer ([place])    skin cancer  . CHB (complete heart block) ([place])   . CHF (congestive heart failure) ([place]) 01/06/2012  . Chronic lower back pain   . 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 Coughing up blood    "comes from my throat" (01/06/2012)  . DDD (degenerative disc disease), lumbar   . Depressed   . Difficult intubation    Eschmann stylet used in 2002 and 2007; "trouble waking up afterwards" (01/06/2012)  . Emphysema   . Fatty liver disease, nonalcoholic   . GERD (gastroesophageal reflux disease)   . H/O hiatal hernia   . Headache   . Hypertension   . Hypothyroidism   . Pacemaker   . Pneumonia Aug 2016  . Presence of permanent cardiac pacemaker [id number]/[id number]   [facility]  . Sinus pause 01/06/2012   5.2 seconds  . Sleep apnea    "don't wear mask" (01/06/2012)  . Stroke [facility])    pt states that he might have had a stroke not sure  . Type II diabetes mellitus ([place])   . Varicose vein    of esophagus   Past Surgical History:  Procedure Laterality Date  . ATRIAL FLUTTER ABLATION N/A 07/10/2013   Procedure: ATRIAL FLUTTER ABLATION;  Surgeon: Evans Lance, MD;  Location: [facility] CATH LAB;  Service: Cardiovascular;  Laterality: N/A;  . BACK SURGERY    . CHOLECYSTECTOMY  1993  . COLONOSCOPY  11/08/2004   [id number] rectum, colon, TI.  Marland Kitchen COLONOSCOPY N/A 05/28/2014   Dr. Gala Romney: Redundant colon. single colonic polyp removed as described above. Tubular adenoma  . ESOPHAGEAL DILATION N/A 05/28/2014   Procedure: ESOPHAGEAL DILATION;  Surgeon: Daneil Dolin, MD;  Location: [facility] ENDO SUITE;  Service: Endoscopy;  Laterality: N/A;  . ESOPHAGOGASTRODUODENOSCOPY  11/08/2004   [id number] esophageal  erosions consistent with erosive reflux esophagitis/Areas of hemorrhage and nodularity of the fundal mucosa of uncertain significance, biopsied.  Small hiatal hernia, otherwise normal stomach  . ESOPHAGOGASTRODUODENOSCOPY  2010   Dr. Gala Romney: 3 columns Grade 1 varices, erosive esophagitis, HH, portal gastropathy, normal D1, D2  . ESOPHAGOGASTRODUODENOSCOPY N/A 05/28/2014   Dr. Gala Romney: MIld erosive reflux esophagitis. Grade 1 esophageal varices. Patent esophagus. No dilation performed. Hiatal hernia.   Marland Kitchen ESOPHAGOGASTRODUODENOSCOPY (EGD) WITH ESOPHAGEAL DILATION N/A 02/14/2013   [id number] 1 esophageal varices. Abnormal distal esophagus/status post biopsy after Maloney dilation. Portal gastropathy. Antral erosions-status post biopsy. path negative for H.pylori, benign path.  . LUMBAR [place]; ~ 1995; ~ 1996  . NASAL SEPTUM SURGERY  1992  . nuclear stress test  10/19/2004   No ischemia  . PERMANENT PACEMAKER INSERTION  01/08/2012   CHB  . PERMANENT PACEMAKER INSERTION N/A 01/09/2012   Procedure: PERMANENT PACEMAKER INSERTION;  Surgeon: Sanda Klein, MD;  Location: [facility] CATH LAB;  Service: Cardiovascular;  Laterality: N/A;  . POSTERIOR FUSION LUMBAR SPINE  1999   L4-5  . SPINAL CORD STIMULATOR IMPLANT  2006  . SPINAL CORD STIMULATOR REMOVAL N/A 01/27/2015   Procedure: LUMBAR SPINAL CORD STIMULATOR REMOVAL;  Surgeon: Kristeen Miss, MD;  Location: [facility]  ORS;  Service: Neurosurgery;  Laterality: N/A;  LUMBAR SPINAL CORD STIMULATOR REMOVAL  . TONSILLECTOMY AND ADENOIDECTOMY  1992  . US ECHOCARDIOGRAPHY  12/28/2011   mild LVH,mild mitral annulara ca+,mild MR  . Warthin's tumor excision  1990's   right   Social History   Social History  . Marital status: Married    Spouse name: N/A  . Number of children: N/A  . Years of education: N/A   Social History Main Topics  . Smoking status: Current Every Day Smoker    Packs/day: 0.25    Years: 45.00    Types: Cigarettes    Start date: 04/11/1968     Last attempt to quit: 04/12/2015  . Smokeless tobacco: Never Used     Comment: Quit x 8 months this time  . Alcohol use No     Comment: "quit alcohol 2011" Previously drinking socially about twice per month  . Drug use: No  . Sexual activity: No   Other Topics Concern  . None   Social History Narrative   Lives with wife in a one story home.  Has 3 children.     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     Education: some college.   Outpatient Encounter Prescriptions as of 01/19/2016  Medication Sig  . ANORO ELLIPTA 62.5-25 MCG/INH AEPB INHALE 1 PUFF ONCE DAILY  . Apremilast (OTEZLA) 30 MG TABS Take 30 mg by mouth 2 (two) times daily.  Marland Kitchen buPROPion (WELLBUTRIN SR) 150 MG 12 hr tablet Take 150 mg by mouth 2 (two) times daily.  . canagliflozin (INVOKANA) 100 MG TABS tablet Take 1 tablet (100 mg total) by mouth daily before breakfast.  . Cholecalciferol (VITAMIN D PO) Take 1 tablet by mouth daily.  . Cyanocobalamin (VITAMIN B 12 PO) Take 1 tablet by mouth daily.  . diazepam (VALIUM) 10 MG tablet Take 10 mg by mouth every 6 (six) hours as needed for anxiety.  Marland Kitchen diltiazem (CARDIZEM CD) 180 MG 24 hr capsule Take 1 capsule (180 mg total) by mouth daily.  Marland Kitchen escitalopram (LEXAPRO) 10 MG tablet Take 10 mg by mouth daily.  . flecainide (TAMBOCOR) 100 MG tablet TAKE 1 TABLET BY MOUTH TWICE A DAY  . furosemide (LASIX) 20 MG tablet Take 2 tablets (40 mg total) by mouth daily as needed (swelling).  . Insulin Detemir (LEVEMIR FLEXTOUCH) 100 UNIT/ML Pen Inject 50 Units into the skin at bedtime.  . metFORMIN (GLUCOPHAGE) 1000 MG tablet TAKE 1 TABLET BY MOUTH TWICE A DAY  . Multiple Vitamin (MULTIVITAMIN) tablet Take 1 tablet by mouth daily.  . nadolol (CORGARD) 40 MG tablet Take 1 tablet (40 mg) in the morning and 1/2 (20 mg) tablet by mouth every evening  . ondansetron (ZOFRAN) 4 MG tablet Take 1 tablet (4 mg total) by mouth every 8 (eight) hours as needed for nausea or vomiting.  Glory Rosebush VERIO  test strip USE TO TEST BLOOD SUGAR FOUR TIMES A DAY  . OXYGEN Inhale 2 L into the lungs at bedtime.  . pantoprazole (PROTONIX) 40 MG tablet TAKE 1 TABLET (40 MG TOTAL) BY MOUTH DAILY.  . rivaroxaban (XARELTO) 20 MG TABS tablet TAKE 1 TABLET BY MOUTH EVERY DAY WITH DINNER  . simvastatin (ZOCOR) 20 MG tablet TAKE 1 TABLET (20 MG TOTAL) BY MOUTH EVERY EVENING.  Marland Kitchen spironolactone (ALDACTONE) 50 MG tablet TAKE 1 TABLET (50 MG TOTAL) BY MOUTH 2 (TWO) TIMES DAILY.  . SYNTHROID 112 MCG tablet TAKE 1 TABLET (112 MCG TOTAL) BY  MOUTH DAILY BEFORE BREAKFAST.  Marland Kitchen traMADol (ULTRAM) 50 MG tablet Take 50 mg by mouth every 6 (six) hours as needed.  . [DISCONTINUED] Insulin Detemir (LEVEMIR FLEXTOUCH) 100 UNIT/ML Pen Inject 60 Units into the skin at bedtime.  . [DISCONTINUED] INVOKANA 300 MG TABS tablet TAKE 1 TABLET (300 MG TOTAL) BY MOUTH DAILY.   No facility-administered encounter medications on file as of 01/19/2016.    ALLERGIES: Allergies  Allergen Reactions  . Nitroglycerin Hives, Swelling and Rash   VACCINATION STATUS: Immunization History  Administered Date(s) Administered  . Influenza Split 01/09/2013  . Influenza,inj,Quad PF,36+ Mos 01/09/2014, 12/23/2014  . Pneumococcal Polysaccharide-23 01/10/2012    Diabetes  He presents for his follow-up diabetic visit. He has type 2 diabetes mellitus. Onset time: He was diagnosed at approximate age of 76 years. His disease course has been improving. There are no hypoglycemic associated symptoms. Pertinent negatives for hypoglycemia include no confusion, headaches, pallor or seizures. There are no diabetic associated symptoms. Pertinent negatives for diabetes include no chest pain, no fatigue, no polydipsia, no polyphagia, no polyuria and no weakness. There are no hypoglycemic complications. Symptoms are improving. Diabetic complications include a CVA, heart disease and nephropathy. Risk factors for coronary artery disease include diabetes mellitus,  dyslipidemia, hypertension, male sex, obesity, sedentary lifestyle and tobacco exposure. Current diabetic treatment includes intensive insulin program. He is compliant with treatment most of the time. His weight is decreasing steadily. He is following a diabetic diet. When asked about meal planning, he reported none. He participates in exercise intermittently. There is no change in his home blood glucose trend. His breakfast blood glucose range is generally 130-140 mg/dl. His overall blood glucose range is 130-140 mg/dl.  Hyperlipidemia  This is a chronic problem. The current episode started more than 1 year ago. Pertinent negatives include no chest pain, myalgias or shortness of breath. Current antihyperlipidemic treatment includes statins. Risk factors for coronary artery disease include dyslipidemia, diabetes mellitus, hypertension, male sex and a sedentary lifestyle.  Hypertension  This is a chronic problem. The current episode started more than 1 year ago. Pertinent negatives include no chest pain, headaches, neck pain, palpitations or shortness of breath. Risk factors for coronary artery disease include diabetes mellitus, dyslipidemia, obesity, sedentary lifestyle and smoking/tobacco exposure. Compliance problems include diet.  Hypertensive end-organ damage includes CVA and a thyroid problem.  Thyroid Problem  Presents for follow-up visit. Patient reports no cold intolerance, constipation, diarrhea, fatigue, heat intolerance or palpitations. The symptoms have been improving. Past treatments include levothyroxine. His past medical history is significant for hyperlipidemia.     Review of Systems  Constitutional: Negative for fatigue and unexpected weight change.  HENT: Negative for dental problem, mouth sores and trouble swallowing.   Eyes: Negative for visual disturbance.  Respiratory: Negative for cough, choking, chest tightness, shortness of breath and wheezing.   Cardiovascular: Negative for  chest pain, palpitations and leg swelling.  Gastrointestinal: Negative for abdominal distention, abdominal pain, constipation, diarrhea, nausea and vomiting.  Endocrine: Negative for cold intolerance, heat intolerance, polydipsia, polyphagia and polyuria.  Genitourinary: Negative for dysuria, flank pain, hematuria and urgency.  Musculoskeletal: Negative for back pain, gait problem, myalgias and neck pain.  Skin: Negative for pallor, rash and wound.  Neurological: Negative for seizures, syncope, weakness, numbness and headaches.  Psychiatric/Behavioral: Negative.  Negative for confusion and dysphoric mood.    Objective:    BP 128/72   Pulse 88   Ht 5\' 10"  (1.778 m)   Wt 209 lb (94.8 kg)  BMI 29.99 kg/m   Wt Readings from Last 3 Encounters:  01/19/16 209 lb (94.8 kg)  01/06/16 205 lb (93 kg)  12/29/15 205 lb (93 kg)    Physical Exam  Constitutional: He is oriented to person, place, and time. He appears well-developed and well-nourished. He is cooperative. No distress.  HENT:  Head: Normocephalic and atraumatic.  Eyes: EOM are normal.  Neck: Normal range of motion. Neck supple. No tracheal deviation present. No thyromegaly present.  Cardiovascular: Normal rate, S1 normal, S2 normal and normal heart sounds.  Exam reveals no gallop.   No murmur heard. Pulses:      Dorsalis pedis pulses are 1+ on the right side, and 1+ on the left side.       Posterior tibial pulses are 1+ on the right side, and 1+ on the left side.  Pulmonary/Chest: Breath sounds normal. No respiratory distress. He has no wheezes.  Abdominal: Soft. Bowel sounds are normal. He exhibits no distension. There is no tenderness. There is no guarding and no CVA tenderness.  Musculoskeletal: He exhibits no edema.       Right shoulder: He exhibits no swelling and no deformity.  Neurological: He is alert and oriented to person, place, and time. He has normal strength and normal reflexes. No cranial nerve deficit or sensory  deficit. Gait normal.  Skin: Skin is warm and dry. No rash noted. No cyanosis. Nails show no clubbing.  Psychiatric: He has a normal mood and affect. His speech is normal and behavior is normal. Judgment and thought content normal. Cognition and memory are normal.    CMP     Component Value Date/Time   NA 138 01/04/2016 0809   K 4.7 01/04/2016 0809   CL 99 01/04/2016 0809   CO2 28 01/04/2016 0809   GLUCOSE 113 (H) 01/04/2016 0809   BUN 29 (H) 01/04/2016 0809   CREATININE 0.94 01/04/2016 0809   CALCIUM 9.2 01/04/2016 0809   PROT 6.9 01/04/2016 0809   ALBUMIN 4.3 01/04/2016 0809   ALBUMIN 3.3 12/17/2012   AST 29 01/04/2016 0809   AST 23 12/17/2012   ALT 32 01/04/2016 0809   ALKPHOS 172 (H) 01/04/2016 0809   ALKPHOS 423 12/17/2012   BILITOT 0.5 01/04/2016 0809   BILITOT 0.7 12/17/2012   GFRNONAA 87 01/04/2016 0809   GFRAA >89 01/04/2016 0809   Diabetic Labs (most recent): Lab Results  Component Value Date   HGBA1C 5.7 (H) 01/04/2016   HGBA1C 5.9 (H) 09/28/2015   HGBA1C 7.5 (H) 06/24/2015   Most Recent Lipid Panel Lipid Panel     Component Value Date/Time   CHOL 111 (L) 12/11/2015 0753   TRIG 276 (H) 12/11/2015 0753   HDL 30 (L) 12/11/2015 0753   CHOLHDL 3.7 12/11/2015 0753   VLDL 55 (H) 12/11/2015 0753   LDLCALC 26 12/11/2015 0753   Results for MARTRELL, RUMMLER (MRN [id number]) as of 10/07/2015 10:11  Ref. Range 09/28/2015 09:26  TSH Latest Ref Range: 0.40-4.50 mIU/L 0.76  T4,Free(Direct) Latest Ref Range: 0.8-1.8 ng/dL 1.4    Assessment & Plan:   1. Uncontrolled type 2 diabetes mellitus with complication, with long-term current use of insulin (HCC)  - His diabetes is  complicated by coronary artery disease/cardiomyopathy, cerebrovascular accident, and patient remains at a high risk for more acute and chronic complications of diabetes which include CAD, CVA, CKD, retinopathy, and neuropathy. These are all discussed in detail with the patient.  Patient came with  controlled glucose profile, and  recent A1c of  5.7% improving from 7.5% .  Glucose logs and insulin administration records pertaining to this visit,  to be scanned into patient's records.  Recent labs reviewed.   - I have re-counseled the patient on diet management and weight loss  by adopting a carbohydrate restricted / protein rich  Diet.  - Suggestion is made for patient to avoid simple carbohydrates   from their diet including Cakes , Desserts, Ice Cream,  Soda (  diet and regular) , Sweet Tea , Candies,  Chips, Cookies, Artificial Sweeteners,   and "Sugar-free" Products .  This will help patient to have stable blood glucose profile and potentially avoid unintended  Weight gain.  - Patient is advised to stick to a routine mealtimes to eat 3 meals  a day and avoid unnecessary snacks ( to snack only to correct hypoglycemia).  - The patient  has been  scheduled with Jearld Fenton, RDN, CDE for individualized DM education.  - I have approached patient with the following individualized plan to manage diabetes and patient agrees.  - He did very well since last visit. I will continue to adjust his insulin regimen.  - Lower Levemir to 50 units qhs, continue to hold NovoLog for now, continue monitoring of BG ac  breakfast  And when necessary .  -Adjustment parameters for hypo and hyperglycemia were given in a written document to patient.  -Patient is encouraged to call clinic for blood glucose levels less than 70 or above 300 mg /dl.  -I will continue Metformin 1000mg  po BID, and lower  Invokana to 100mg  po qam, SE discussed with patient.   - Patient specific target  for A1c; LDL, HDL, Triglycerides, and  Waist Circumference were discussed in detail.  2) BP/HTN: Controlled. Continue current medications . 3) Lipids/HPL:  continue statins. 4)  Weight/Diet: He has lost significant weight, about 30 pounds since last year, CDE consult in progress, exercise, and carbohydrates information  provided.  5)  hypothyroidism -I will continue  levothyroxine to 112 g by mouth every morning.  - We discussed about correct intake of levothyroxine, at fasting, with water, separated by at least 30 minutes from breakfast, and separated by more than 4 hours from calcium, iron, multivitamins, acid reflux medications (PPIs). -Patient is made aware of the fact that thyroid hormone replacement is needed for life, dose to be adjusted by periodic monitoring of thyroid function tests.   6) Chronic Care/Health Maintenance:  -Patient  is Statin medications and encouraged to continue to follow up with Ophthalmology, Podiatrist at least yearly or according to recommendations, and advised to quit smoking. I have recommended yearly flu vaccine and pneumonia vaccination at least every 5 years; moderate intensity exercise for up to 150 minutes weekly; and  sleep for at least 7 hours a day.  - 25 minutes of time was spent on the care of this patient , 50% of which was applied for counseling on diabetes complications and their preventions.  - I advised patient to maintain close follow up with Purvis Kilts, MD for primary care needs.  Patient is asked to bring meter and  blood glucose logs during their next visit.   Follow up plan: -Return in about 3 months (around 04/20/2016) for follow up with pre-visit labs, meter, and logs.  Glade Lloyd, MD Phone: [phone]  Fax: [phone]   01/19/2016, 2:22 PM

## 2016-01-19 NOTE — Patient Instructions (Signed)

## 2016-01-23 DIAGNOSIS — J449 Chronic obstructive pulmonary disease, unspecified: Secondary | ICD-10-CM | POA: Diagnosis not present

## 2016-01-23 DIAGNOSIS — J9610 Chronic respiratory failure, unspecified whether with hypoxia or hypercapnia: Secondary | ICD-10-CM | POA: Diagnosis not present

## 2016-01-23 DIAGNOSIS — I4891 Unspecified atrial fibrillation: Secondary | ICD-10-CM | POA: Diagnosis not present

## 2016-01-23 DIAGNOSIS — I5030 Unspecified diastolic (congestive) heart failure: Secondary | ICD-10-CM | POA: Diagnosis not present

## 2016-01-27 ENCOUNTER — Other Ambulatory Visit: Payer: Self-pay

## 2016-01-27 MED ORDER — FUROSEMIDE 20 MG PO TABS: 40.0000 mg | ORAL_TABLET | Freq: Every day | ORAL | 3 refills | Status: DC | PRN

## 2016-01-30 DIAGNOSIS — R69 Illness, unspecified: Secondary | ICD-10-CM | POA: Diagnosis not present

## 2016-02-09 ENCOUNTER — Encounter: Payer: Self-pay | Admitting: "Endocrinology

## 2016-02-09 DIAGNOSIS — E1129 Type 2 diabetes mellitus with other diabetic kidney complication: Secondary | ICD-10-CM | POA: Diagnosis not present

## 2016-02-09 DIAGNOSIS — Z0100 Encounter for examination of eyes and vision without abnormal findings: Secondary | ICD-10-CM | POA: Diagnosis not present

## 2016-02-09 DIAGNOSIS — H5200 Hypermetropia, unspecified eye: Secondary | ICD-10-CM | POA: Diagnosis not present

## 2016-02-09 LAB — HM DIABETES EYE EXAM

## 2016-02-23 DIAGNOSIS — J449 Chronic obstructive pulmonary disease, unspecified: Secondary | ICD-10-CM | POA: Diagnosis not present

## 2016-02-23 DIAGNOSIS — I4891 Unspecified atrial fibrillation: Secondary | ICD-10-CM | POA: Diagnosis not present

## 2016-02-23 DIAGNOSIS — J9610 Chronic respiratory failure, unspecified whether with hypoxia or hypercapnia: Secondary | ICD-10-CM | POA: Diagnosis not present

## 2016-02-23 DIAGNOSIS — I5030 Unspecified diastolic (congestive) heart failure: Secondary | ICD-10-CM | POA: Diagnosis not present

## 2016-02-29 ENCOUNTER — Other Ambulatory Visit: Payer: Self-pay | Admitting: "Endocrinology

## 2016-03-01 DIAGNOSIS — Z1389 Encounter for screening for other disorder: Secondary | ICD-10-CM | POA: Diagnosis not present

## 2016-03-01 DIAGNOSIS — J069 Acute upper respiratory infection, unspecified: Secondary | ICD-10-CM | POA: Diagnosis not present

## 2016-03-01 DIAGNOSIS — Z6831 Body mass index (BMI) 31.0-31.9, adult: Secondary | ICD-10-CM | POA: Diagnosis not present

## 2016-03-01 DIAGNOSIS — J209 Acute bronchitis, unspecified: Secondary | ICD-10-CM | POA: Diagnosis not present

## 2016-03-01 DIAGNOSIS — I10 Essential (primary) hypertension: Secondary | ICD-10-CM | POA: Diagnosis not present

## 2016-03-01 DIAGNOSIS — Z0001 Encounter for general adult medical examination with abnormal findings: Secondary | ICD-10-CM | POA: Diagnosis not present

## 2016-03-07 DIAGNOSIS — L4050 Arthropathic psoriasis, unspecified: Secondary | ICD-10-CM | POA: Diagnosis not present

## 2016-03-09 ENCOUNTER — Other Ambulatory Visit: Payer: Self-pay | Admitting: Internal Medicine

## 2016-03-09 ENCOUNTER — Other Ambulatory Visit: Payer: Self-pay | Admitting: "Endocrinology

## 2016-03-24 DIAGNOSIS — J9610 Chronic respiratory failure, unspecified whether with hypoxia or hypercapnia: Secondary | ICD-10-CM | POA: Diagnosis not present

## 2016-03-24 DIAGNOSIS — J449 Chronic obstructive pulmonary disease, unspecified: Secondary | ICD-10-CM | POA: Diagnosis not present

## 2016-03-24 DIAGNOSIS — I5030 Unspecified diastolic (congestive) heart failure: Secondary | ICD-10-CM | POA: Diagnosis not present

## 2016-03-24 DIAGNOSIS — I4891 Unspecified atrial fibrillation: Secondary | ICD-10-CM | POA: Diagnosis not present

## 2016-04-13 ENCOUNTER — Other Ambulatory Visit: Payer: Self-pay | Admitting: "Endocrinology

## 2016-04-13 DIAGNOSIS — E1159 Type 2 diabetes mellitus with other circulatory complications: Secondary | ICD-10-CM | POA: Diagnosis not present

## 2016-04-13 DIAGNOSIS — Z794 Long term (current) use of insulin: Secondary | ICD-10-CM | POA: Diagnosis not present

## 2016-04-13 DIAGNOSIS — E1165 Type 2 diabetes mellitus with hyperglycemia: Secondary | ICD-10-CM | POA: Diagnosis not present

## 2016-04-13 DIAGNOSIS — E038 Other specified hypothyroidism: Secondary | ICD-10-CM | POA: Diagnosis not present

## 2016-04-13 LAB — HEMOGLOBIN A1C
Hgb A1c MFr Bld: 6.3 % — ABNORMAL HIGH (ref <5.7)
Mean Plasma Glucose: 134 mg/dL

## 2016-04-13 LAB — TSH: TSH: 0.75 m[IU]/L (ref 0.40–4.50)

## 2016-04-13 LAB — T4, FREE: Free T4: 1.8 ng/dL (ref 0.8–1.8)

## 2016-04-14 LAB — COMPREHENSIVE METABOLIC PANEL
ALBUMIN: 4.6 g/dL (ref 3.6–5.1)
ALK PHOS: 144 U/L — AB (ref 40–115)
ALT: 32 U/L (ref 9–46)
AST: 22 U/L (ref 10–35)
BILIRUBIN TOTAL: 0.5 mg/dL (ref 0.2–1.2)
BUN: 27 mg/dL — AB (ref 7–25)
CO2: 27 mmol/L (ref 20–31)
CREATININE: 0.99 mg/dL (ref 0.70–1.25)
Calcium: 9.9 mg/dL (ref 8.6–10.3)
Chloride: 98 mmol/L (ref 98–110)
Glucose, Bld: 116 mg/dL — ABNORMAL HIGH (ref 65–99)
Potassium: 4.7 mmol/L (ref 3.5–5.3)
SODIUM: 141 mmol/L (ref 135–146)
TOTAL PROTEIN: 7.5 g/dL (ref 6.1–8.1)

## 2016-04-21 ENCOUNTER — Ambulatory Visit (INDEPENDENT_AMBULATORY_CARE_PROVIDER_SITE_OTHER): Payer: Medicare HMO | Admitting: "Endocrinology

## 2016-04-21 ENCOUNTER — Encounter: Payer: Self-pay | Admitting: "Endocrinology

## 2016-04-21 VITALS — BP 141/78 | HR 66 | Ht 70.0 in | Wt 215.0 lb

## 2016-04-21 DIAGNOSIS — I10 Essential (primary) hypertension: Secondary | ICD-10-CM | POA: Diagnosis not present

## 2016-04-21 DIAGNOSIS — E782 Mixed hyperlipidemia: Secondary | ICD-10-CM | POA: Diagnosis not present

## 2016-04-21 DIAGNOSIS — E1165 Type 2 diabetes mellitus with hyperglycemia: Secondary | ICD-10-CM

## 2016-04-21 DIAGNOSIS — E038 Other specified hypothyroidism: Secondary | ICD-10-CM | POA: Diagnosis not present

## 2016-04-21 DIAGNOSIS — Z794 Long term (current) use of insulin: Secondary | ICD-10-CM

## 2016-04-21 DIAGNOSIS — E1159 Type 2 diabetes mellitus with other circulatory complications: Secondary | ICD-10-CM

## 2016-04-21 DIAGNOSIS — IMO0002 Reserved for concepts with insufficient information to code with codable children: Secondary | ICD-10-CM

## 2016-04-21 NOTE — Patient Instructions (Signed)

## 2016-04-21 NOTE — Progress Notes (Signed)
Subjective:    Patient ID: Ralph [name], male    DOB: 06/13/1950, PCP Purvis Kilts, MD   Past Medical History:  Diagnosis Date  . Anginal pain ([place])   . Arthritis    "back; fingers" (01/06/2012)  . Atrial flutter [facility])    s/p EPS +RF ablation of typical atrial flutter April 2015  . Cancer ([place])    skin cancer  . CHB (complete heart block) ([place])   . CHF (congestive heart failure) ([place]) 01/06/2012  . Chronic lower back pain   . 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 Coughing up blood    "comes from my throat" (01/06/2012)  . DDD (degenerative disc disease), lumbar   . Depressed   . Difficult intubation    Eschmann stylet used in 2002 and 2007; "trouble waking up afterwards" (01/06/2012)  . Emphysema   . Fatty liver disease, nonalcoholic   . GERD (gastroesophageal reflux disease)   . H/O hiatal hernia   . Headache   . Hypertension   . Hypothyroidism   . Pacemaker   . Pneumonia Aug 2016  . Presence of permanent cardiac pacemaker [id number]   [place]  . Sinus pause 01/06/2012   5.2 seconds  . Sleep apnea    "don't wear mask" (01/06/2012)  . Stroke [facility])    pt states that he might have had a stroke not sure  . Type II diabetes mellitus ([place])   . Varicose vein    of esophagus   Past Surgical History:  Procedure Laterality Date  . ATRIAL FLUTTER ABLATION N/A 07/10/2013   Procedure: ATRIAL FLUTTER ABLATION;  Surgeon: Evans Lance, MD;  Location: [facility] CATH LAB;  Service: Cardiovascular;  Laterality: N/A;  . BACK SURGERY    . CHOLECYSTECTOMY  1993  . COLONOSCOPY  11/08/2004   [id number] rectum, colon, TI.  Marland Kitchen COLONOSCOPY N/A 05/28/2014   Dr. Gala Romney: Redundant colon. single colonic polyp removed as described above. Tubular adenoma  . ESOPHAGEAL DILATION N/A 05/28/2014   Procedure: ESOPHAGEAL DILATION;  Surgeon: Daneil Dolin, MD;  Location[facility] ENDO [facility];  Service: Endoscopy;  Laterality: N/A;  . ESOPHAGOGASTRODUODENOSCOPY  11/08/2004   [id number] esophageal  erosions consistent with erosive reflux esophagitis/Areas of hemorrhage and nodularity of the fundal mucosa of uncertain significance, biopsied.  Small hiatal hernia, otherwise normal stomach  . ESOPHAGOGASTRODUODENOSCOPY  2010   Dr. Gala Romney: 3 columns Grade 1 varices, erosive esophagitis, HH, portal gastropathy, normal D1, D2  . ESOPHAGOGASTRODUODENOSCOPY N/A 05/28/2014   Dr. Gala Romney: MIld erosive reflux esophagitis. Grade 1 esophageal varices. Patent esophagus. No dilation performed. Hiatal hernia.   Marland Kitchen ESOPHAGOGASTRODUODENOSCOPY (EGD) WITH ESOPHAGEAL DILATION N/A 02/14/2013   [id number] 1 esophageal varices. Abnormal distal esophagus/status post biopsy after Maloney dilation. Portal gastropathy. Antral erosions-status post biopsy. path negative for H.pylori, benign path.  . LUMBAR [place]; ~ 1995; ~ 1996  . NASAL SEPTUM SURGERY  1992  . nuclear stress test  10/19/2004   No ischemia  . PERMANENT PACEMAKER INSERTION  01/08/2012   CHB  . PERMANENT PACEMAKER INSERTION N/A 01/09/2012   Procedure: PERMANENT PACEMAKER INSERTION;  Surgeon: Sanda Klein, MD;  Location: [place] CATH LAB;  Service: Cardiovascular;  Laterality: N/A;  . POSTERIOR FUSION LUMBAR SPINE  1999   L4-5  . SPINAL CORD STIMULATOR IMPLANT  2006  . SPINAL CORD STIMULATOR REMOVAL N/A 01/27/2015   Procedure: LUMBAR SPINAL CORD STIMULATOR REMOVAL;  Surgeon: Kristeen Miss, MD;  Location: [place]  ORS;  Service: Neurosurgery;  Laterality: N/A;  LUMBAR SPINAL CORD STIMULATOR REMOVAL  . TONSILLECTOMY AND ADENOIDECTOMY  1992  . US ECHOCARDIOGRAPHY  12/28/2011   mild LVH,mild mitral annulara ca+,mild MR  . Warthin's tumor excision  1990's   right   Social History   Social History  . Marital status: Married    Spouse name: N/A  . Number of children: N/A  . Years of education: N/A   Social History Main Topics  . Smoking status: Current Every Day Smoker    Packs/day: 0.25    Years: 45.00    Types: Cigarettes    Start date: 04/11/1968     Last attempt to quit: 04/12/2015  . Smokeless tobacco: Never Used     Comment: Quit x 8 months this time  . Alcohol use No     Comment: "quit alcohol 2011" Previously drinking socially about twice per month  . Drug use: No  . Sexual activity: No   Other Topics Concern  . None   Social History Narrative   Lives with wife in a one story home.  Has 3 children.     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     Education: some college.   Outpatient Encounter Prescriptions as of 04/21/2016  Medication Sig  . Omega-3 Fatty Acids (FISH OIL) 1200 MG CAPS Take by mouth 2 (two) times daily.  [name] ELLIPTA 62.5-25 MCG/INH AEPB INHALE 1 PUFF ONCE DAILY  . Apremilast (OTEZLA) 30 MG TABS Take 30 mg by mouth 2 (two) times daily.  Marland Kitchen buPROPion (WELLBUTRIN SR) 150 MG 12 hr tablet Take 150 mg by mouth 2 (two) times daily.  . canagliflozin (INVOKANA) 100 MG TABS tablet Take 1 tablet (100 mg total) by mouth daily before breakfast.  . Cholecalciferol (VITAMIN D PO) Take 1 tablet by mouth daily.  . Cyanocobalamin (VITAMIN B 12 PO) Take 1 tablet by mouth daily.  . diazepam (VALIUM) 10 MG tablet Take 10 mg by mouth every 6 (six) hours as needed for anxiety.  Marland Kitchen diltiazem (CARDIZEM CD) 180 MG 24 hr capsule Take 1 capsule (180 mg total) by mouth daily.  Marland Kitchen escitalopram (LEXAPRO) 10 MG tablet Take 10 mg by mouth daily.  . flecainide (TAMBOCOR) 100 MG tablet TAKE 1 TABLET BY MOUTH TWICE A DAY  . furosemide (LASIX) 20 MG tablet Take 2 tablets (40 mg total) by mouth daily as needed (swelling).  . Insulin Detemir (LEVEMIR FLEXTOUCH) 100 UNIT/ML Pen Inject 50 Units into the skin at bedtime.  . metFORMIN (GLUCOPHAGE) 1000 MG tablet TAKE 1 TABLET BY MOUTH TWICE A DAY  . Multiple Vitamin (MULTIVITAMIN) tablet Take 1 tablet by mouth daily.  . nadolol (CORGARD) 40 MG tablet Take 1 tablet (40 mg) in the morning and 1/2 (20 mg) tablet by mouth every evening  . ondansetron (ZOFRAN) 4 MG tablet Take 1 tablet (4 mg total) by  mouth every 8 (eight) hours as needed for nausea or vomiting.  Glory Rosebush VERIO test strip USE TO TEST BLOOD SUGAR FOUR TIMES A DAY  . OXYGEN Inhale 2 L into the lungs at bedtime.  . pantoprazole (PROTONIX) 40 MG tablet TAKE 1 TABLET (40 MG TOTAL) BY MOUTH DAILY.  . rivaroxaban (XARELTO) 20 MG TABS tablet TAKE 1 TABLET BY MOUTH EVERY DAY WITH DINNER  . simvastatin (ZOCOR) 20 MG tablet TAKE 1 TABLET (20 MG TOTAL) BY MOUTH EVERY EVENING.  Marland Kitchen spironolactone (ALDACTONE) 50 MG tablet TAKE 1 TABLET (50 MG TOTAL) BY MOUTH  2 (TWO) TIMES DAILY.  . SYNTHROID 112 MCG tablet TAKE 1 TABLET (112 MCG TOTAL) BY MOUTH DAILY BEFORE BREAKFAST.  Marland Kitchen traMADol (ULTRAM) 50 MG tablet Take 50 mg by mouth every 6 (six) hours as needed.   No facility-administered encounter medications on file as of 04/21/2016.    ALLERGIES: Allergies  Allergen Reactions  . Nitroglycerin Hives, Swelling and Rash   VACCINATION STATUS: Immunization History  Administered Date(s) Administered  . Influenza Split 01/09/2013  . Influenza,inj,Quad PF,36+ Mos 01/09/2014, 12/23/2014  . Pneumococcal Polysaccharide-23 01/10/2012    Diabetes  He presents for his follow-up diabetic visit. He has type 2 diabetes mellitus. Onset time: He was diagnosed at approximate age of 47 years. His disease course has been stable. There are no hypoglycemic associated symptoms. Pertinent negatives for hypoglycemia include no confusion, headaches, pallor or seizures. There are no diabetic associated symptoms. Pertinent negatives for diabetes include no chest pain, no fatigue, no polydipsia, no polyphagia, no polyuria and no weakness. There are no hypoglycemic complications. Symptoms are stable. Diabetic complications include a CVA, heart disease and nephropathy. Risk factors for coronary artery disease include diabetes mellitus, dyslipidemia, hypertension, male sex, obesity, sedentary lifestyle and tobacco exposure. Current diabetic treatment includes intensive insulin  program. He is compliant with treatment most of the time. His weight is decreasing steadily. He is following a diabetic diet. When asked about meal planning, he reported none. He participates in exercise intermittently. There is no change in his home blood glucose trend. His breakfast blood glucose range is generally 130-140 mg/dl. His overall blood glucose range is 130-140 mg/dl.  Hyperlipidemia  This is a chronic problem. The current episode started more than 1 year ago. Pertinent negatives include no chest pain, myalgias or shortness of breath. Current antihyperlipidemic treatment includes statins. Risk factors for coronary artery disease include dyslipidemia, diabetes mellitus, hypertension, male sex and a sedentary lifestyle.  Hypertension  This is a chronic problem. The current episode started more than 1 year ago. Pertinent negatives include no chest pain, headaches, neck pain, palpitations or shortness of breath. Risk factors for coronary artery disease include diabetes mellitus, dyslipidemia, obesity, sedentary lifestyle and smoking/tobacco exposure. Compliance problems include diet.  Hypertensive end-organ damage includes CVA and a thyroid problem.  Thyroid Problem  Presents for follow-up visit. Patient reports no cold intolerance, constipation, diarrhea, fatigue, heat intolerance or palpitations. The symptoms have been improving. Past treatments include levothyroxine. His past medical history is significant for hyperlipidemia.     Review of Systems  Constitutional: Negative for fatigue and unexpected weight change.  HENT: Negative for dental problem, mouth sores and trouble swallowing.   Eyes: Negative for visual disturbance.  Respiratory: Negative for cough, choking, chest tightness, shortness of breath and wheezing.   Cardiovascular: Negative for chest pain, palpitations and leg swelling.  Gastrointestinal: Negative for abdominal distention, abdominal pain, constipation, diarrhea, nausea  and vomiting.  Endocrine: Negative for cold intolerance, heat intolerance, polydipsia, polyphagia and polyuria.  Genitourinary: Negative for dysuria, flank pain, hematuria and urgency.  Musculoskeletal: Negative for back pain, gait problem, myalgias and neck pain.  Skin: Negative for pallor, rash and wound.  Neurological: Negative for seizures, syncope, weakness, numbness and headaches.  Psychiatric/Behavioral: Negative.  Negative for confusion and dysphoric mood.    Objective:    BP (!) 141/78   Pulse 66   Ht 5\' 10"  (1.778 m)   Wt 215 lb (97.5 kg)   BMI 30.85 kg/m   Wt Readings from Last 3 Encounters:  04/21/16 215 lb (  97.5 kg)  01/19/16 209 lb (94.8 kg)  01/06/16 205 lb (93 kg)    Physical Exam  Constitutional: He is oriented to person, place, and time. He appears well-developed and well-nourished. He is cooperative. No distress.  HENT:  Head: Normocephalic and atraumatic.  Eyes: EOM are normal.  Neck: Normal range of motion. Neck supple. No tracheal deviation present. No thyromegaly present.  Cardiovascular: Normal rate, S1 normal, S2 normal and normal heart sounds.  Exam reveals no gallop.   No murmur heard. Pulses:      Dorsalis pedis pulses are 1+ on the right side, and 1+ on the left side.       Posterior tibial pulses are 1+ on the right side, and 1+ on the left side.  Pulmonary/Chest: Breath sounds normal. No respiratory distress. He has no wheezes.  Abdominal: Soft. Bowel sounds are normal. He exhibits no distension. There is no tenderness. There is no guarding and no CVA tenderness.  Musculoskeletal: He exhibits no edema.       Right shoulder: He exhibits no swelling and no deformity.  Neurological: He is alert and oriented to person, place, and time. He has normal strength and normal reflexes. No cranial nerve deficit or sensory deficit. Gait normal.  Skin: Skin is warm and dry. No rash noted. No cyanosis. Nails show no clubbing.  Psychiatric: He has a normal mood  and affect. His speech is normal and behavior is normal. Judgment and thought content normal. Cognition and memory are normal.    CMP     Component Value Date/Time   NA 141 04/13/2016 1305   K 4.7 04/13/2016 1305   CL 98 04/13/2016 1305   CO2 27 04/13/2016 1305   GLUCOSE 116 (H) 04/13/2016 1305   BUN 27 (H) 04/13/2016 1305   CREATININE 0.99 04/13/2016 1305   CALCIUM 9.9 04/13/2016 1305   PROT 7.5 04/13/2016 1305   ALBUMIN 4.6 04/13/2016 1305   ALBUMIN 3.3 12/17/2012   AST 22 04/13/2016 1305   AST 23 12/17/2012   ALT 32 04/13/2016 1305   ALKPHOS 144 (H) 04/13/2016 1305   ALKPHOS 423 12/17/2012   BILITOT 0.5 04/13/2016 1305   BILITOT 0.7 12/17/2012   GFRNONAA 87 01/04/2016 0809   GFRAA >89 01/04/2016 0809   Diabetic Labs (most recent): Lab Results  Component Value Date   HGBA1C 6.3 (H) 04/13/2016   HGBA1C 5.7 (H) 01/04/2016   HGBA1C 5.9 (H) 09/28/2015   Lipid Panel     Component Value Date/Time   CHOL 111 (L) 12/11/2015 0753   TRIG 276 (H) 12/11/2015 0753   HDL 30 (L) 12/11/2015 0753   CHOLHDL 3.7 12/11/2015 0753   VLDL 55 (H) 12/11/2015 0753   LDLCALC 26 12/11/2015 0753    Results for TRAVONTE, DUGGAL (MRN [id number]) as of 04/21/2016 14:05  Ref. Range 04/13/2016 13:05  TSH Latest Ref Range: 0.40 - 4.50 mIU/L 0.75  T4,Free(Direct) Latest Ref Range: 0.8 - 1.8 ng/dL 1.8   Assessment & Plan:   1. Uncontrolled type 2 diabetes mellitus with complication, with long-term current use of insulin (HCC)  - His diabetes is  complicated by coronary artery disease/cardiomyopathy, cerebrovascular accident, and patient remains at a high risk for more acute and chronic complications of diabetes which include CAD, CVA, CKD, retinopathy, and neuropathy. These are all discussed in detail with the patient.  Patient came with controlled glucose profile, and  recent A1c of  6.3% improving from 7.5% .  Glucose logs and insulin administration records  pertaining to this visit,  to be scanned  into patient's records.  Recent labs reviewed.   - I have re-counseled the patient on diet management and weight loss  by adopting a carbohydrate restricted / protein rich  Diet.  - Suggestion is made for patient to avoid simple carbohydrates   from their diet including Cakes , Desserts, Ice Cream,  Soda (  diet and regular) , Sweet Tea , Candies,  Chips, Cookies, Artificial Sweeteners,   and "Sugar-free" Products .  This will help patient to have stable blood glucose profile and potentially avoid unintended  Weight gain.  - Patient is advised to stick to a routine mealtimes to eat 3 meals  a day and avoid unnecessary snacks ( to snack only to correct hypoglycemia).  - The patient  has been  scheduled with Jearld Fenton, RDN, CDE for individualized DM education.  - I have approached patient with the following individualized plan to manage diabetes and patient agrees.  - He  continued to do well. I will continue to adjust his insulin regimen.  - Lower Levemir to 50 units qhs, continue to hold NovoLog for now, continue monitoring of BG ac  breakfast  And when necessary .  -Adjustment parameters for hypo and hyperglycemia were given in a written document to patient.  -Patient is encouraged to call clinic for blood glucose levels less than 70 or above 300 mg /dl.  -I will continue Metformin 1000mg  po BID, and lower  Invokana to 100mg  po qam, SE discussed with patient.   - Patient specific target  for A1c; LDL, HDL, Triglycerides, and  Waist Circumference were discussed in detail.  2) BP/HTN: Controlled. Continue current medications . 3) Lipids/HPL:  continue statins. 4)  Weight/Diet: He has lost significant weight, about 30 pounds since last year, CDE consult in progress, exercise, and carbohydrates information provided.  5)  hypothyroidism -I will continue  levothyroxine to 112 g by mouth every morning.  - We discussed about correct intake of levothyroxine, at fasting, with water,  separated by at least 30 minutes from breakfast, and separated by more than 4 hours from calcium, iron, multivitamins, acid reflux medications (PPIs). -Patient is made aware of the fact that thyroid hormone replacement is needed for life, dose to be adjusted by periodic monitoring of thyroid function tests.   6) Chronic Care/Health Maintenance:  -Patient  is Statin medications and encouraged to continue to follow up with Ophthalmology, Podiatrist at least yearly or according to recommendations, and advised to quit smoking. I have recommended yearly flu vaccine and pneumonia vaccination at least every 5 years; moderate intensity exercise for up to 150 minutes weekly; and  sleep for at least 7 hours a day.  - 25 minutes of time was spent on the care of this patient , 50% of which was applied for counseling on diabetes complications and their preventions.  - I advised patient to maintain close follow up with Purvis Kilts, MD for primary care needs.  Patient is asked to bring meter and  blood glucose logs during their next visit.   Follow up plan: -Return in about 3 months (around 07/20/2016) for follow up with pre-visit labs, meter, and logs.  Glade Lloyd, MD Phone: [phone]  Fax: [phone]   04/21/2016, 2:20 PM

## 2016-04-24 DIAGNOSIS — J449 Chronic obstructive pulmonary disease, unspecified: Secondary | ICD-10-CM | POA: Diagnosis not present

## 2016-04-24 DIAGNOSIS — I4891 Unspecified atrial fibrillation: Secondary | ICD-10-CM | POA: Diagnosis not present

## 2016-04-24 DIAGNOSIS — J9610 Chronic respiratory failure, unspecified whether with hypoxia or hypercapnia: Secondary | ICD-10-CM | POA: Diagnosis not present

## 2016-04-24 DIAGNOSIS — I5030 Unspecified diastolic (congestive) heart failure: Secondary | ICD-10-CM | POA: Diagnosis not present

## 2016-05-01 ENCOUNTER — Other Ambulatory Visit: Payer: Self-pay | Admitting: Cardiology

## 2016-05-05 ENCOUNTER — Ambulatory Visit (INDEPENDENT_AMBULATORY_CARE_PROVIDER_SITE_OTHER): Payer: Medicare HMO | Admitting: Cardiology

## 2016-05-05 ENCOUNTER — Encounter: Payer: Self-pay | Admitting: Cardiology

## 2016-05-05 VITALS — BP 106/62 | HR 78 | Ht 70.0 in | Wt 217.0 lb

## 2016-05-05 DIAGNOSIS — I480 Paroxysmal atrial fibrillation: Secondary | ICD-10-CM | POA: Diagnosis not present

## 2016-05-05 DIAGNOSIS — Z950 Presence of cardiac pacemaker: Secondary | ICD-10-CM | POA: Diagnosis not present

## 2016-05-05 DIAGNOSIS — I10 Essential (primary) hypertension: Secondary | ICD-10-CM | POA: Diagnosis not present

## 2016-05-05 NOTE — Patient Instructions (Signed)

## 2016-05-05 NOTE — Progress Notes (Signed)
Clinical Summary Ralph [name] is a 66 y.o.male seen today for follow up of the following medical problems.  1. Bradycardia/sinus arrest  - St Jude dual chamber pacemaker pacemaker implanted Sept 2013 ([place] [address] [facility]).   - Normal device function 12/03/15.No recent lightheadedness or dizziness.   2. Afib/aflutter - previous side effects on multaq - seen by EP 06/28/13, started on flecanide. Continued to have symptoms. Had RF ablation of flutter by Dr Lovena Le 07/10/13. 10/2014 device check did show some AF burden - on nadolol for history of palpitations as well as esoph varices   - isolated episode of palpitations for about 1 hour.  - no bleeding troubles on xarelto  3. HTN  - compliant w/ meds  - checks regularly at home, typically 100-110/60-70s  4. HL  - compliant with statin - 12/2015 TC 111 TG 276 HDL 30 LDL 26  5. NASH cirrhosis  - followed by GI  - history of grade I varices, no history of bleeding on anticoag    6. COPD  - compliant with inhalers and home oxygen. - followed by pulmonary  7. Orthostatic dizziness - no recent troubles.    8. DM2 - followed Dr Dorris Fetch   Past Medical History:  Diagnosis Date  . Anginal pain ([place])   . Arthritis    "back; fingers" (01/06/2012)  . Atrial flutter [facility])    s/p EPS +RF ablation of typical atrial flutter April 2015  . Cancer ([place])    skin cancer  . CHB (complete heart block) ([place])   . CHF (congestive heart failure) ([place]) 01/06/2012  . Chronic lower back pain   . Cirrhosis ([place])    NASH-Hep A and B immune  . Coughing up blood    "comes from my throat" (01/06/2012)  . DDD (degenerative disc disease), lumbar   . Depressed   . Difficult intubation    Eschmann stylet used in 2002 and 2007; "trouble waking up afterwards" (01/06/2012)  . Emphysema   . Fatty liver disease, nonalcoholic   . GERD (gastroesophageal reflux disease)   . H/O hiatal hernia   . Headache   . Hypertension   .  Hypothyroidism   . Pacemaker   . Pneumonia Aug 2016  . Presence of permanent cardiac pacemaker 9/292013   St.Jude  . Sinus pause 01/06/2012   5.2 seconds  . Sleep apnea    "don't wear mask" (01/06/2012)  . Stroke [facility])    pt states that he might have had a stroke not sure  . Type II diabetes mellitus ([place])   . Varicose vein    of esophagus     Allergies  Allergen Reactions  . Nitroglycerin Hives, Swelling and Rash     Current Outpatient Prescriptions  Medication Sig Dispense Refill  . ANORO ELLIPTA 62.5-25 MCG/INH AEPB INHALE 1 PUFF ONCE DAILY 180 each 3  . Apremilast (OTEZLA) 30 MG TABS Take 30 mg by mouth 2 (two) times daily.    Marland Kitchen buPROPion (WELLBUTRIN SR) 150 MG 12 hr tablet Take 150 mg by mouth 2 (two) times daily.    . canagliflozin (INVOKANA) 100 MG TABS tablet Take 1 tablet (100 mg total) by mouth daily before breakfast. 30 tablet 3  . Cholecalciferol (VITAMIN D PO) Take 1 tablet by mouth daily.    . Cyanocobalamin (VITAMIN B 12 PO) Take 1 tablet by mouth daily.    . diazepam (VALIUM) 10 MG tablet Take 10 mg by mouth every 6 (six)  hours as needed for anxiety.    Marland Kitchen diltiazem (CARDIZEM CD) 180 MG 24 hr capsule TAKE ONE CAPSULE BY MOUTH EVERY DAY 90 capsule 2  . escitalopram (LEXAPRO) 10 MG tablet Take 10 mg by mouth daily.    . flecainide (TAMBOCOR) 100 MG tablet TAKE 1 TABLET BY MOUTH TWICE A DAY 180 tablet 1  . furosemide (LASIX) 20 MG tablet Take 2 tablets (40 mg total) by mouth daily as needed (swelling). 180 tablet 3  . Insulin Detemir (LEVEMIR FLEXTOUCH) 100 UNIT/ML Pen Inject 50 Units into the skin at bedtime. 30 mL 2  . metFORMIN (GLUCOPHAGE) 1000 MG tablet TAKE 1 TABLET BY MOUTH TWICE A DAY 180 tablet 0  . Multiple Vitamin (MULTIVITAMIN) tablet Take 1 tablet by mouth daily.    . nadolol (CORGARD) 40 MG tablet Take 1 tablet (40 mg) in the morning and 1/2 (20 mg) tablet by mouth every evening 135 tablet 3  . Omega-3 Fatty Acids (FISH OIL) 1200 MG CAPS Take by mouth 2  (two) times daily.    . ondansetron (ZOFRAN) 4 MG tablet Take 1 tablet (4 mg total) by mouth every 8 (eight) hours as needed for nausea or vomiting. 40 tablet 0  . ONETOUCH VERIO test strip USE TO TEST BLOOD SUGAR FOUR TIMES A DAY 150 each 5  . OXYGEN Inhale 2 L into the lungs at bedtime.    . pantoprazole (PROTONIX) 40 MG tablet TAKE 1 TABLET (40 MG TOTAL) BY MOUTH DAILY. 90 tablet 4  . rivaroxaban (XARELTO) 20 MG TABS tablet TAKE 1 TABLET BY MOUTH EVERY DAY WITH DINNER 90 tablet 3  . simvastatin (ZOCOR) 20 MG tablet TAKE 1 TABLET (20 MG TOTAL) BY MOUTH EVERY EVENING. 90 tablet 3  . spironolactone (ALDACTONE) 50 MG tablet TAKE 1 TABLET (50 MG TOTAL) BY MOUTH 2 (TWO) TIMES DAILY. 180 tablet 3  . SYNTHROID 112 MCG tablet TAKE 1 TABLET (112 MCG TOTAL) BY MOUTH DAILY BEFORE BREAKFAST. 90 tablet 1  . traMADol (ULTRAM) 50 MG tablet Take 50 mg by mouth every 6 (six) hours as needed.     No current facility-administered medications for this visit.      Past Surgical History:  Procedure Laterality Date  . ATRIAL FLUTTER ABLATION N/A 07/10/2013   Procedure: ATRIAL FLUTTER ABLATION;  Surgeon: Evans Lance, MD;  Location[facility] CATH LAB;  Service: Cardiovascular;  Laterality: N/A;  . BACK SURGERY    . CHOLECYSTECTOMY  1993  . COLONOSCOPY  11/08/2004   [id number] rectum, colon, TI.  Marland Kitchen COLONOSCOPY N/A 05/28/2014   Dr. Gala Romney: Redundant colon. single colonic polyp removed as described above. Tubular adenoma  . ESOPHAGEAL DILATION N/A 05/28/2014   Procedure: ESOPHAGEAL DILATION;  Surgeon: Daneil Dolin, MD;  [facility];  Service: Endoscopy;  Laterality: N/A;  . ESOPHAGOGASTRODUODENOSCOPY  11/08/2004   [id number] esophageal erosions consistent with erosive reflux esophagitis/Areas of hemorrhage and nodularity of the fundal mucosa of uncertain significance, biopsied.  Small hiatal hernia, otherwise normal stomach  . ESOPHAGOGASTRODUODENOSCOPY  2010   Dr. Gala Romney: 3 columns Grade 1 varices, erosive  esophagitis, HH, portal gastropathy, normal D1, D2  . ESOPHAGOGASTRODUODENOSCOPY N/A 05/28/2014   Dr. Gala Romney: MIld erosive reflux esophagitis. Grade 1 esophageal varices. Patent esophagus. No dilation performed. Hiatal hernia.   Marland Kitchen ESOPHAGOGASTRODUODENOSCOPY (EGD) WITH ESOPHAGEAL DILATION N/A 02/14/2013   [id number] 1 esophageal varices. Abnormal distal esophagus/status post biopsy after Maloney dilation. Portal gastropathy. Antral erosions-status post biopsy. path negative for H.pylori, benign path.  Marland Kitchen  [place] SURGERY  1994; ~ 1995; ~ 1996  . NASAL SEPTUM SURGERY  1992  . nuclear stress test  10/19/2004   No ischemia  . PERMANENT PACEMAKER INSERTION  01/08/2012   [facility]B  . PERMANENT PACEMAKER INSERTION N/A 01/09/2012   Procedure: PERMANENT PACEMAKER INSERTION;  Surgeon: Sanda Klein, MD;  Location: [place] CATH LAB;  Service: Cardiovascular;  Laterality: N/A;  . POSTERIOR FUSION LUMBAR SPINE  1999   L4-5  . SPINAL CORD STIMULATOR IMPLANT  2006  . SPINAL CORD STIMULATOR REMOVAL N/A 01/27/2015   Procedure: LUMBAR SPINAL CORD STIMULATOR REMOVAL;  Surgeon: Kristeen Miss, MD;  Location: [place] NEURO ORS;  Service: Neurosurgery;  Laterality: N/A;  LUMBAR SPINAL CORD STIMULATOR REMOVAL  . TONSILLECTOMY AND ADENOIDECTOMY  1992  . US ECHOCARDIOGRAPHY  12/28/2011   mild LVH,mild mitral annulara ca+,mild MR  . Warthin's tumor excision  1990's   right     Allergies  Allergen Reactions  . Nitroglycerin Hives, Swelling and Rash      Family History  Problem Relation Age of Onset  . Cancer Mother     Deceased, 59  . Ovarian cancer Mother   . Arrhythmia Father   . Other Father     Deceased 41  . Stroke Brother   . Stroke Brother   . Stroke Sister   . Crohn's disease Daughter      Social History Ralph [name] reports that he has been smoking Cigarettes.  He started smoking about 48 years ago. He has a 11.25 pack-year smoking history. He has never used smokeless tobacco. Ralph [name] reports that he does  not drink alcohol.   Review of Systems CONSTITUTIONAL: No weight loss, fever, chills, weakness or fatigue.  HEENT: Eyes: No visual loss, blurred vision, double vision or yellow sclerae.No hearing loss, sneezing, congestion, runny nose or sore throat.  SKIN: No rash or itching.  CARDIOVASCULAR: per hpi RESPIRATORY: No shortness of breath, cough or sputum.  GASTROINTESTINAL: No anorexia, nausea, vomiting or diarrhea. No abdominal pain or blood.  GENITOURINARY: No burning on urination, no polyuria NEUROLOGICAL: No headache, dizziness, syncope, paralysis, ataxia, numbness or tingling in the extremities. No change in bowel or bladder control.  MUSCULOSKELETAL: No muscle, back pain, joint pain or stiffness.  LYMPHATICS: No enlarged nodes. No history of splenectomy.  PSYCHIATRIC: No history of depression or anxiety.  ENDOCRINOLOGIC: No reports of sweating, cold or heat intolerance. No polyuria or polydipsia.  Marland Kitchen   Physical Examination Vitals:   05/05/16 1358  BP: 106/62  Pulse: 78   Vitals:   05/05/16 1358  Weight: 217 lb (98.4 kg)  Height: 5\' 10"  (1.778 m)    Gen: resting comfortably, no acute distress HEENT: no scleral icterus, pupils equal round and reactive, no palptable cervical adenopathy,  CV: RRR, no m/r/g, no jvd Resp: Clear to auscultation bilaterally GI: abdomen is soft, non-tender, non-distended, normal bowel sounds, no hepatosplenomegaly MSK: extremities are warm, no edema.  Skin: warm, no rash Neuro:  no focal deficits Psych: appropriate affect   Diagnostic Studies Jan 2014 Myoview: no ischemia   11/2012 Echo: LVEF 60-65%, mild LVH, moderate basal septal hypertrophy, mild LAE     Assessment and Plan   1. Afib/Aflutter - isolated episode of symptoms. Continue current meds.  - CHADS2Vasc score is 2, continue anticoagulation   2. HTN:  - he is at goal,continue current meds   3. HL  - followed by PCP. Reports PCP is watching his LFTs closely due  to an increase and  history of NASH, will defer management to PCP   4. Sinus arrest  - no recent symptoms - continue to monitor pacemaker  5. COPD - per pulmonary  6. Orthostatic dizziness - no recent symptoms, continue to monitor     F/u 6 months     Arnoldo Lenis, [name]

## 2016-05-16 ENCOUNTER — Encounter: Payer: Self-pay | Admitting: "Endocrinology

## 2016-05-19 ENCOUNTER — Encounter: Payer: Self-pay | Admitting: Internal Medicine

## 2016-05-25 DIAGNOSIS — J449 Chronic obstructive pulmonary disease, unspecified: Secondary | ICD-10-CM | POA: Diagnosis not present

## 2016-05-25 DIAGNOSIS — J9610 Chronic respiratory failure, unspecified whether with hypoxia or hypercapnia: Secondary | ICD-10-CM | POA: Diagnosis not present

## 2016-05-25 DIAGNOSIS — I4891 Unspecified atrial fibrillation: Secondary | ICD-10-CM | POA: Diagnosis not present

## 2016-05-25 DIAGNOSIS — I5030 Unspecified diastolic (congestive) heart failure: Secondary | ICD-10-CM | POA: Diagnosis not present

## 2016-05-30 ENCOUNTER — Other Ambulatory Visit: Payer: Self-pay | Admitting: "Endocrinology

## 2016-05-31 DIAGNOSIS — M545 Low back pain: Secondary | ICD-10-CM | POA: Diagnosis not present

## 2016-05-31 DIAGNOSIS — Z87891 Personal history of nicotine dependence: Secondary | ICD-10-CM | POA: Diagnosis not present

## 2016-05-31 DIAGNOSIS — Z7901 Long term (current) use of anticoagulants: Secondary | ICD-10-CM | POA: Diagnosis not present

## 2016-05-31 DIAGNOSIS — M62838 Other muscle spasm: Secondary | ICD-10-CM | POA: Diagnosis not present

## 2016-05-31 DIAGNOSIS — E039 Hypothyroidism, unspecified: Secondary | ICD-10-CM | POA: Diagnosis not present

## 2016-05-31 DIAGNOSIS — I110 Hypertensive heart disease with heart failure: Secondary | ICD-10-CM | POA: Diagnosis not present

## 2016-05-31 DIAGNOSIS — E119 Type 2 diabetes mellitus without complications: Secondary | ICD-10-CM | POA: Diagnosis not present

## 2016-05-31 DIAGNOSIS — E669 Obesity, unspecified: Secondary | ICD-10-CM | POA: Diagnosis not present

## 2016-05-31 DIAGNOSIS — H04129 Dry eye syndrome of unspecified lacrimal gland: Secondary | ICD-10-CM | POA: Diagnosis not present

## 2016-05-31 DIAGNOSIS — Z599 Problem related to housing and economic circumstances, unspecified: Secondary | ICD-10-CM | POA: Diagnosis not present

## 2016-05-31 DIAGNOSIS — R69 Illness, unspecified: Secondary | ICD-10-CM | POA: Diagnosis not present

## 2016-05-31 DIAGNOSIS — I499 Cardiac arrhythmia, unspecified: Secondary | ICD-10-CM | POA: Diagnosis not present

## 2016-05-31 DIAGNOSIS — L988 Other specified disorders of the skin and subcutaneous tissue: Secondary | ICD-10-CM | POA: Diagnosis not present

## 2016-05-31 DIAGNOSIS — Z794 Long term (current) use of insulin: Secondary | ICD-10-CM | POA: Diagnosis not present

## 2016-05-31 DIAGNOSIS — Z0000 Encounter for general adult medical examination without abnormal findings: Secondary | ICD-10-CM | POA: Diagnosis not present

## 2016-05-31 DIAGNOSIS — M1389 Other specified arthritis, multiple sites: Secondary | ICD-10-CM | POA: Diagnosis not present

## 2016-05-31 DIAGNOSIS — E785 Hyperlipidemia, unspecified: Secondary | ICD-10-CM | POA: Diagnosis not present

## 2016-05-31 DIAGNOSIS — Z6832 Body mass index (BMI) 32.0-32.9, adult: Secondary | ICD-10-CM | POA: Diagnosis not present

## 2016-06-06 IMAGING — US US ABDOMEN COMPLETE
1 series · 14 of 25 positions shown · non-contrast
Comparison: Ultrasound of 02/23/2015.  CT of 08/26/2014

CLINICAL DATA: Cirrhosis.  Prior cholecystectomy.

EXAM:
ABDOMEN ULTRASOUND COMPLETE

[Series 1: us abdomen complete · 0.25mm/px · 14 of 119 slices shown]
[im 1/119]
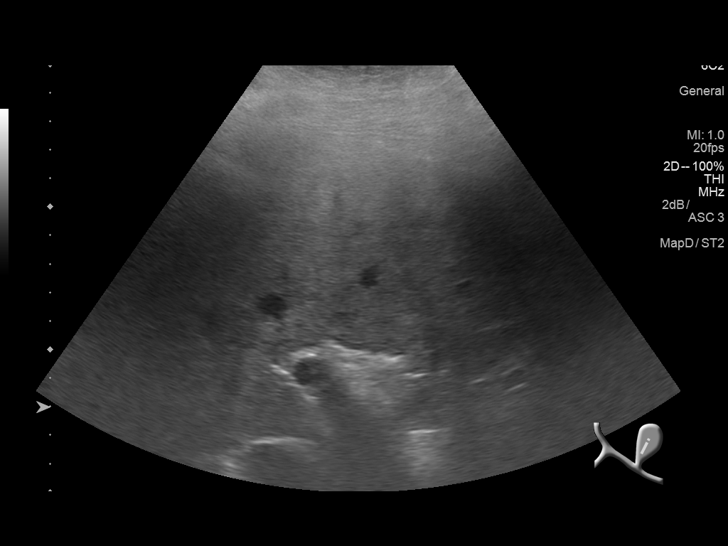
[im 10/119]
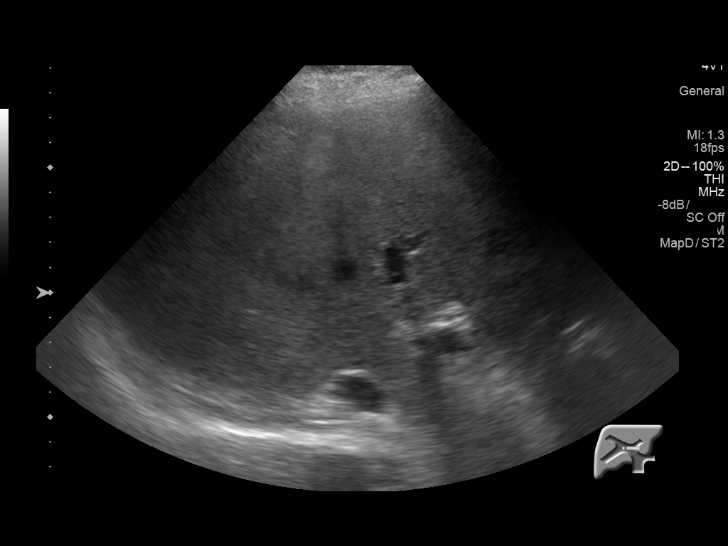
[im 20/119]
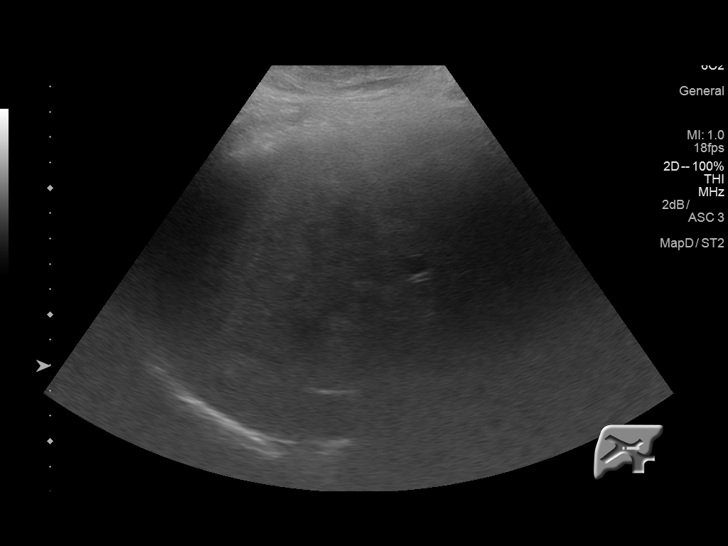
[im 30/119]
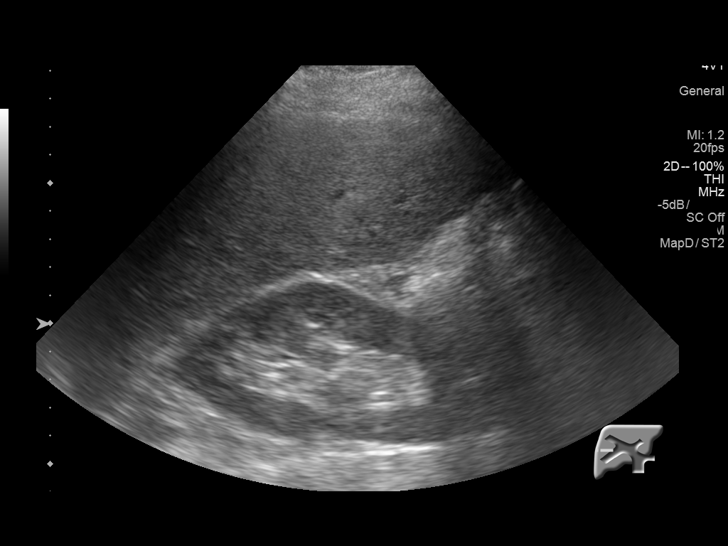
[im 40/119]
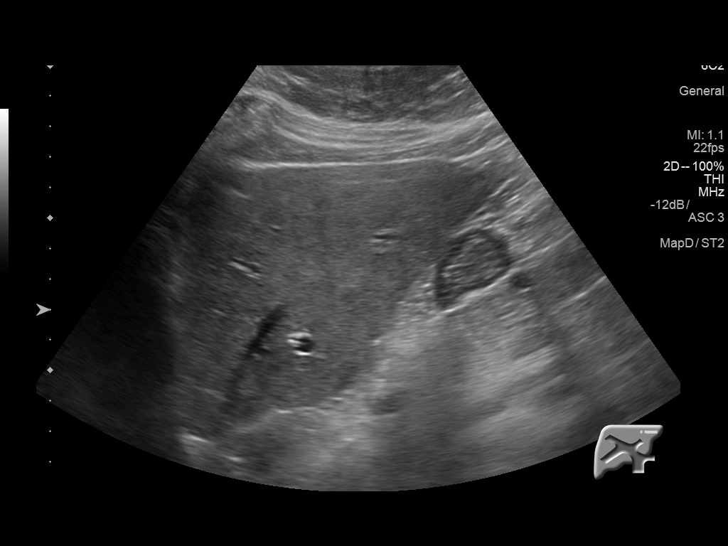
[im 45/119]
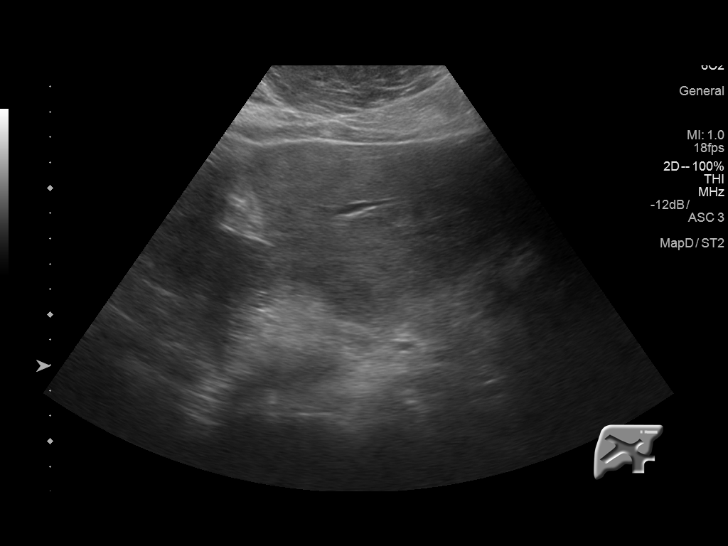
[im 55/119]
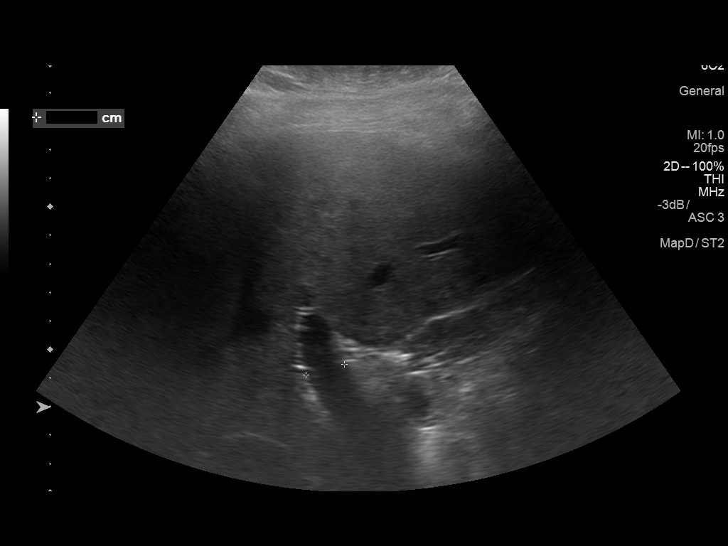
[im 64/119]
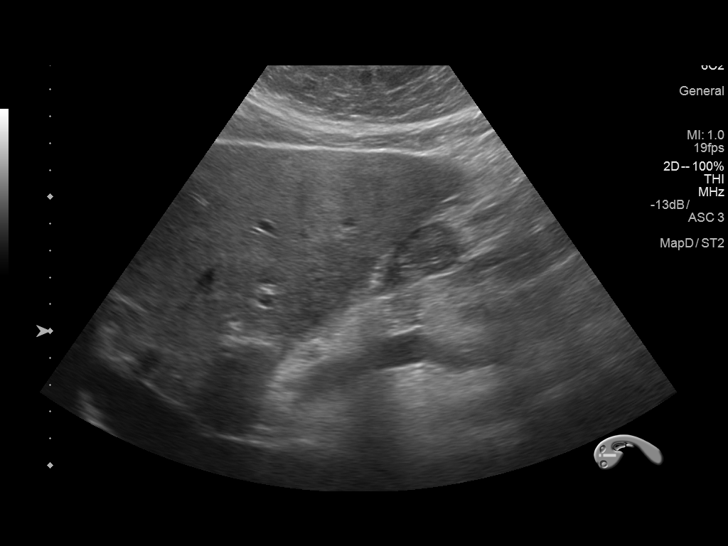
[im 74/119]
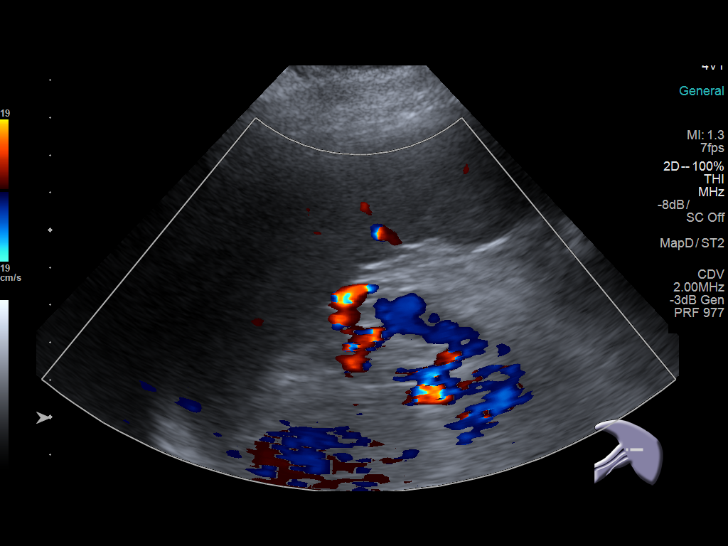
[im 79/119]
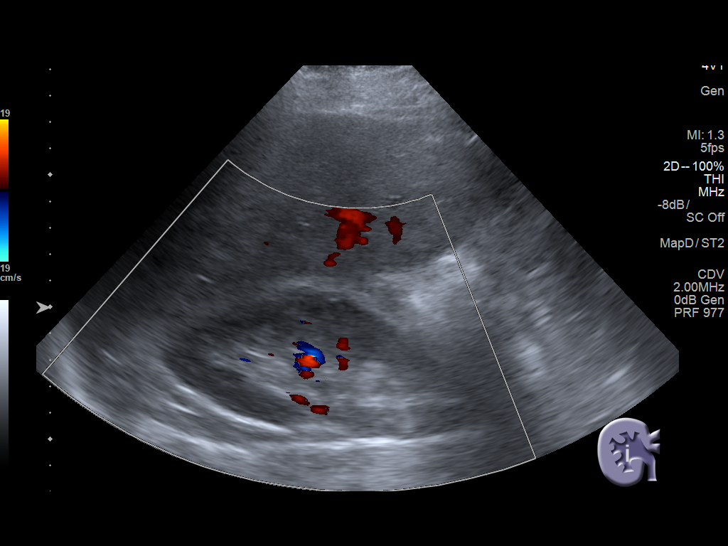
[im 89/119]
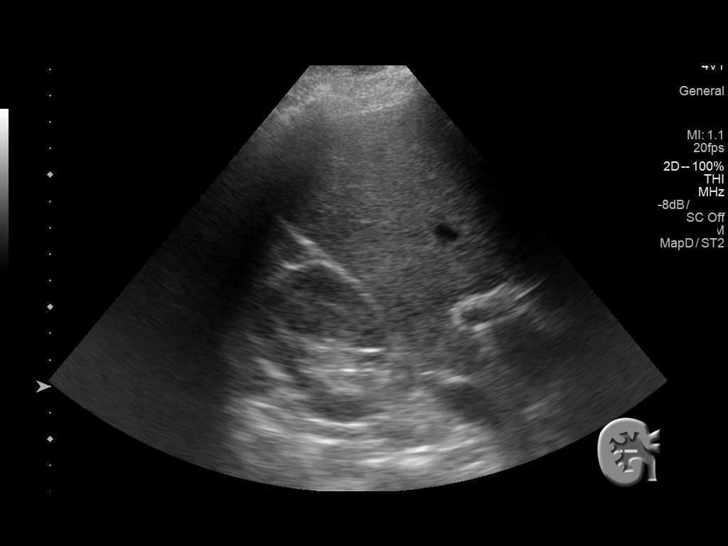
[im 99/119]
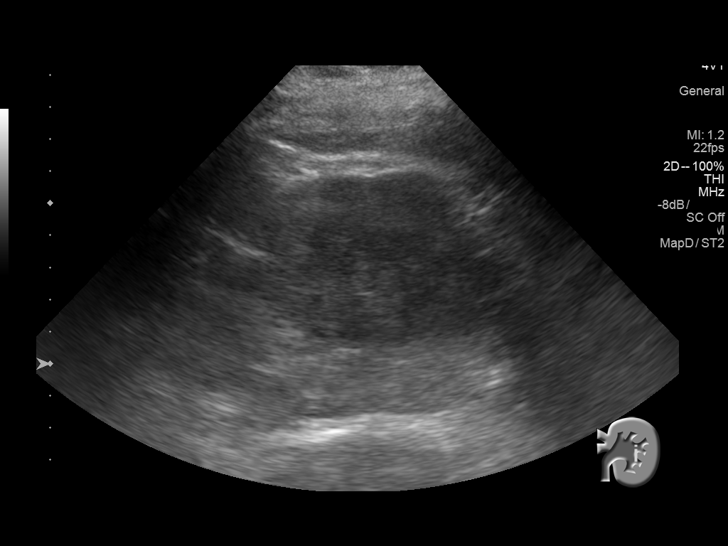
[im 109/119]
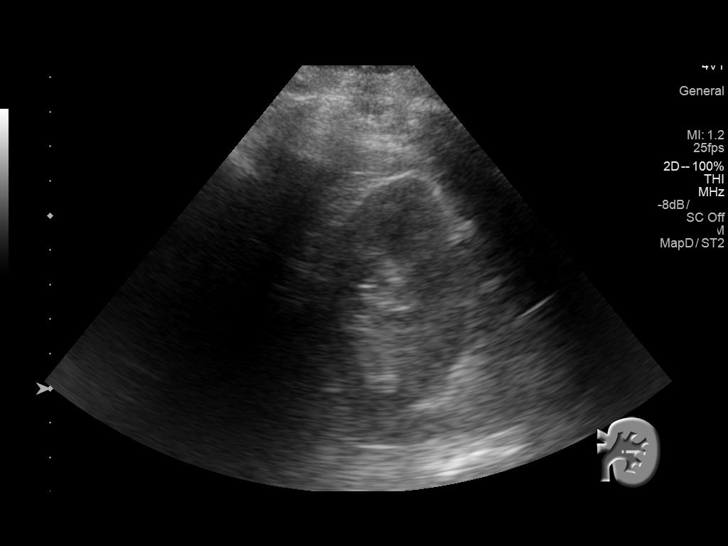
[im 119/119]
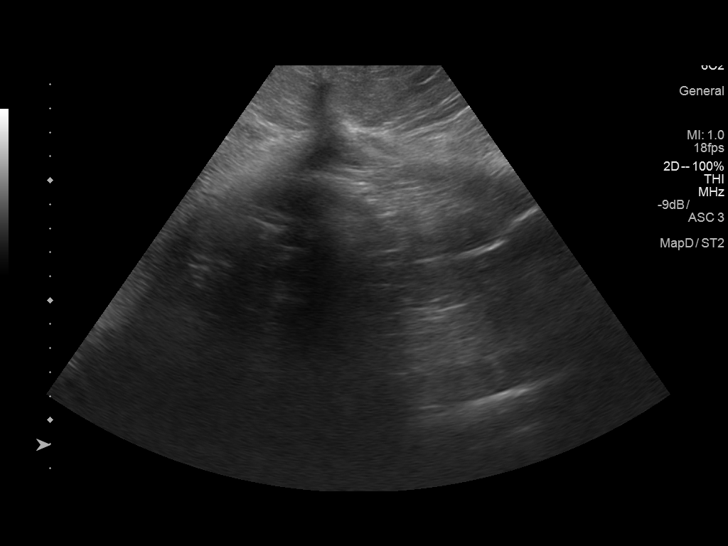

[14 of 25 positions shown; findings below may reference images not displayed]

FINDINGS: Gallbladder: Surgically absent

Common bile duct: Diameter: Normal, 3 mm.

Liver: Increased echogenicity, heterogeneously. Mild cirrhosis which
is more apparent on prior CT. No focal liver lesion.

IVC: No abnormality visualized.

Pancreas: Visualized portion unremarkable.

Spleen: Splenomegaly.  Splenic volume of 522 cc.

Right Kidney: Length: 12.9 cm. Echogenicity within normal limits. No
mass or hydronephrosis visualized.

Left Kidney: Length: 12.4 cm. Echogenicity within normal limits. No
mass or hydronephrosis visualized.

Abdominal aorta: No aneurysm visualized.

Other findings: No ascites.

Exam degraded by patient body habitus.
IMPRESSION: 1. Hepatic steatosis and mild cirrhosis.  No focal liver lesion.
2. Splenomegaly.

## 2016-06-14 ENCOUNTER — Telehealth: Payer: Self-pay | Admitting: Internal Medicine

## 2016-06-14 NOTE — Telephone Encounter (Signed)
RECALL FOR ULTRASOUND IN APRIL

## 2016-06-14 NOTE — Telephone Encounter (Signed)
Letter mailed

## 2016-06-17 ENCOUNTER — Other Ambulatory Visit: Payer: Self-pay | Admitting: "Endocrinology

## 2016-06-26 ENCOUNTER — Other Ambulatory Visit: Payer: Self-pay | Admitting: "Endocrinology

## 2016-06-27 DIAGNOSIS — R69 Illness, unspecified: Secondary | ICD-10-CM | POA: Diagnosis not present

## 2016-07-01 ENCOUNTER — Ambulatory Visit (INDEPENDENT_AMBULATORY_CARE_PROVIDER_SITE_OTHER): Payer: Medicare HMO | Admitting: Internal Medicine

## 2016-07-01 ENCOUNTER — Ambulatory Visit: Payer: Medicare HMO | Admitting: Internal Medicine

## 2016-07-01 ENCOUNTER — Encounter: Payer: Self-pay | Admitting: Internal Medicine

## 2016-07-01 VITALS — BP 129/71 | HR 74 | Temp 97.4°F | Ht 70.0 in | Wt 224.4 lb

## 2016-07-01 DIAGNOSIS — K7460 Unspecified cirrhosis of liver: Secondary | ICD-10-CM

## 2016-07-01 DIAGNOSIS — K219 Gastro-esophageal reflux disease without esophagitis: Secondary | ICD-10-CM | POA: Diagnosis not present

## 2016-07-01 DIAGNOSIS — Z86010 Personal history of colonic polyps: Secondary | ICD-10-CM

## 2016-07-01 NOTE — Progress Notes (Signed)
Primary Care Physician:  Purvis Kilts, MD Primary Gastroenterologist:  Dr. Gala Romney  Pre-Procedure History & Physical: HPI:  Ralph [name] is a 66 y.o. male here for follow-up of [place]/cirrhosis GERD and dysphagia. Patient has some oropharyngeal dysfunction. Working with speech pathology. He is managing fairly well. Reflux symptoms well controlled. Grade 1 varices with no prior bleeding - on Nadolol. On Xarelto for atrial fib. MELD not obtainable. Last ultrasound demonstrated cirrhotic liver no obvious hepatoma.  History of colonic adenoma; do surveillance 2021.  He gained 20 pounds since his last visit. This is likely due to more hibernation during the cold months. He is getting out more now. For instance, mowed his lawn with a push mower yesterday. His watch stated he walked 3-1/2 miles.  Continues on Aldactone 50 mg twice daily.  Past Medical History:  Diagnosis Date  . Anginal pain ([place])   . Arthritis    "back; fingers" (01/06/2012)  . Atrial flutter [facility])    s/p EPS +RF ablation of typical atrial flutter April 2015  . Cancer ([place])    skin cancer  . CHB (complete heart block) ([place])   . CHF (congestive heart failure) ([place]) 01/06/2012  . Chronic lower back pain   . Cirrhosis ([place])    NASH-Hep A and B immune  . Coughing up blood    "comes from my throat" (01/06/2012)  . DDD (degenerative disc disease), lumbar   . Depressed   . Difficult intubation    Eschmann stylet used in 2002 and 2007; "trouble waking up afterwards" (01/06/2012)  . Emphysema   . Fatty liver disease, nonalcoholic   . GERD (gastroesophageal reflux disease)   . H/O hiatal hernia   . Headache   . Hypertension   . Hypothyroidism   . Pacemaker   . Pneumonia Aug 2016  . Presence of permanent cardiac pacemaker 9/292013   St.Jude  . Sinus pause 01/06/2012   5.2 seconds  . Sleep apnea    "don't wear mask" (01/06/2012)  . Stroke [facility])    pt states that he might have had a stroke not sure  . Type II  diabetes mellitus ([place])   . Varicose vein    of esophagus    Past Surgical History:  Procedure Laterality Date  . ATRIAL FLUTTER ABLATION N/A 07/10/2013   Procedure: ATRIAL FLUTTER ABLATION;  Surgeon: Evans Lance, MD;  Location: [facility] CATH LAB;  Service: Cardiovascular;  Laterality: N/A;  . BACK SURGERY    . CHOLECYSTECTOMY  1993  . COLONOSCOPY  11/08/2004   [id number] rectum, colon, TI.  Marland Kitchen COLONOSCOPY N/A 05/28/2014   Dr. Gala Romney: Redundant colon. single colonic polyp removed as described above. Tubular adenoma  . ESOPHAGEAL DILATION N/A 05/28/2014   Procedure: ESOPHAGEAL DILATION;  Surgeon: Daneil Dolin, MD;  [facility];  Service: Endoscopy;  Laterality: N/A;  . ESOPHAGOGASTRODUODENOSCOPY  11/08/2004   [id number] esophageal erosions consistent with erosive reflux esophagitis/Areas of hemorrhage and nodularity of the fundal mucosa of uncertain significance, biopsied.  Small hiatal hernia, otherwise normal stomach  . ESOPHAGOGASTRODUODENOSCOPY  2010   Dr. Gala Romney: 3 columns Grade 1 varices, erosive esophagitis, HH, portal gastropathy, normal D1, D2  . ESOPHAGOGASTRODUODENOSCOPY N/A 05/28/2014   Dr. Gala Romney: MIld erosive reflux esophagitis. Grade 1 esophageal varices. Patent esophagus. No dilation performed. Hiatal hernia.   Marland Kitchen ESOPHAGOGASTRODUODENOSCOPY (EGD) WITH ESOPHAGEAL DILATION N/A 02/14/2013   TWS:FKCLE 1 esophageal varices. Abnormal distal esophagus/status post biopsy after Maloney dilation. Portal gastropathy. Antral erosions-status post  biopsy. path negative for H.pylori, benign path.  . LUMBAR [place]; ~ 1995; ~ 1996  . NASAL SEPTUM SURGERY  1992  . nuclear stress test  10/19/2004   No ischemia  . PERMANENT PACEMAKER INSERTION  01/08/2012   [facility]B  . PERMANENT PACEMAKER INSERTION N/A 01/09/2012   Procedure: PERMANENT PACEMAKER INSERTION;  Surgeon: Sanda Klein, MD;  Location: [place] CATH LAB;  Service: Cardiovascular;  Laterality: N/A;  . POSTERIOR FUSION LUMBAR SPINE   1999   L4-5  . SPINAL CORD STIMULATOR IMPLANT  2006  . SPINAL CORD STIMULATOR REMOVAL N/A 01/27/2015   Procedure: LUMBAR SPINAL CORD STIMULATOR REMOVAL;  Surgeon: Kristeen Miss, MD;  Location: [place] NEURO ORS;  Service: Neurosurgery;  Laterality: N/A;  LUMBAR SPINAL CORD STIMULATOR REMOVAL  . TONSILLECTOMY AND ADENOIDECTOMY  1992  . US ECHOCARDIOGRAPHY  12/28/2011   mild LVH,mild mitral annulara ca+,mild MR  . 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 Authorizing Provider  buPROPion (WELLBUTRIN SR) 150 MG 12 hr tablet Take 150 mg by mouth 2 (two) times daily.   Yes Historical Provider, MD  diazepam (VALIUM) 10 MG tablet Take 10 mg by mouth every 6 (six) hours as needed for anxiety.   Yes Historical Provider, MD  diltiazem (CARDIZEM CD) 180 MG 24 hr capsule TAKE ONE CAPSULE BY MOUTH EVERY DAY 05/02/16  Yes Arnoldo Lenis, MD  escitalopram (LEXAPRO) 10 MG tablet Take 10 mg by mouth daily.   Yes Historical Provider, MD  flecainide (TAMBOCOR) 100 MG tablet TAKE 1 TABLET BY MOUTH TWICE A DAY 03/09/16  Yes Evans Lance, MD  furosemide (LASIX) 20 MG tablet Take 2 tablets (40 mg total) by mouth daily as needed (swelling). 01/27/16  Yes Arnoldo Lenis, MD  Insulin Detemir (LEVEMIR FLEXTOUCH) 100 UNIT/ML Pen Inject 50 Units into the skin at bedtime. 01/19/16  Yes Cassandria Anger, MD  INVOKANA 100 MG TABS tablet TAKE 1 TABLET (100 MG TOTAL) BY MOUTH DAILY BEFORE BREAKFAST. 05/30/16  Yes Cassandria Anger, MD  metFORMIN (GLUCOPHAGE) 1000 MG tablet TAKE 1 TABLET BY MOUTH TWICE A DAY 05/30/16  Yes Cassandria Anger, MD  nadolol (CORGARD) 40 MG tablet Take 1 tablet (40 mg) in the morning and 1/2 (20 mg) tablet by mouth every evening 08/19/15  Yes Mahala Menghini, PA-C  Omega-3 Fatty Acids (FISH OIL) 1200 MG CAPS Take by mouth 2 (two) times daily.   Yes Historical Provider, MD  ondansetron (ZOFRAN) 4 MG tablet Take 1  tablet (4 mg total) by mouth every 8 (eight) hours as needed for nausea or vomiting. 07/21/14  Yes Mahala Menghini, PA-C  ONETOUCH VERIO test strip USE TO TEST BLOOD SUGAR FOUR TIMES A DAY 11/20/15  Yes Cassandria Anger, MD  pantoprazole (PROTONIX) 40 MG tablet TAKE 1 TABLET (40 MG TOTAL) BY MOUTH DAILY. 01/01/16  Yes Carlis Stable, NP  rivaroxaban (XARELTO) 20 MG TABS tablet TAKE 1 TABLET BY MOUTH EVERY DAY WITH DINNER 10/20/15  Yes Arnoldo Lenis, MD  simvastatin (ZOCOR) 20 MG tablet TAKE 1 TABLET (20 MG TOTAL) BY MOUTH EVERY EVENING. 05/02/16  Yes Arnoldo Lenis, MD  spironolactone (ALDACTONE) 50 MG tablet TAKE 1 TABLET (50 MG TOTAL) BY MOUTH 2 (TWO) TIMES DAILY. 07/29/15  Yes Mahala Menghini, PA-C  SYNTHROID 112 MCG tablet TAKE 1 TABLET (112 MCG TOTAL) BY MOUTH DAILY BEFORE BREAKFAST.  06/27/16  Yes Cassandria Anger, MD  traMADol (ULTRAM) 50 MG tablet Take 50 mg by mouth every 6 (six) hours as needed.   Yes Historical Provider, MD  Celedonio Miyamoto 62.5-25 MCG/INH AEPB INHALE 1 PUFF ONCE DAILY Patient not taking: Reported on 07/01/2016 01/19/15   Collene Gobble, MD  Apremilast (OTEZLA) 30 MG TABS Take 30 mg by mouth 2 (two) times daily.    Historical Provider, MD  Cholecalciferol (VITAMIN D PO) Take 1 tablet by mouth daily.    Historical Provider, MD  Cyanocobalamin (VITAMIN B 12 PO) Take 1 tablet by mouth daily.    Historical Provider, MD  LEVEMIR 100 UNIT/ML injection INJECT 70 UNITS SUBCUTANEOUS AT BEDTIME Patient not taking: Reported on 07/01/2016 06/20/16   Cassandria Anger, MD  Multiple Vitamin (MULTIVITAMIN) tablet Take 1 tablet by mouth daily.    Historical Provider, MD  OXYGEN Inhale 2 L into the lungs at bedtime.    Historical Provider, MD    Allergies as of 07/01/2016 - Review Complete 07/01/2016  Allergen Reaction Noted  . Nitroglycerin Hives, Swelling, and Rash     Family History  Problem Relation Age of Onset  . Cancer Mother     Deceased, 70  . Ovarian cancer Mother     . Arrhythmia Father   . Other Father     Deceased 17  . Stroke Brother   . Stroke Brother   . Stroke Sister   . Crohn's disease Daughter     Social History   Social History  . Marital status: Married    Spouse name: N/A  . Number of children: N/A  . Years of education: N/A   Occupational History  . Not on file.   Social History Main Topics  . Smoking status: Current Every Day Smoker    Packs/day: 0.25    Years: 45.00    Types: Cigarettes    Start date: 04/11/1968    Last attempt to quit: 04/12/2015  . Smokeless tobacco: Never Used     Comment: Quit x 8 months this time  . Alcohol use No     Comment: "quit alcohol 2011" Previously drinking socially about twice per month  . Drug use: No  . Sexual activity: No   Other Topics Concern  . Not on file   Social History Narrative   Lives with wife in a one story home.  Has 3 children.     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     Education: some college.    Review of Systems: See HPI, otherwise negative ROS  Physical Exam: BP 129/71   Pulse 74   Temp 97.4 F (36.3 C) (Oral)   Ht 5\' 10"  (1.778 m)   Wt 224 lb 6.4 oz (101.8 kg)   BMI 32.20 kg/m  General:   Alert,   pleasant and cooperative in NAD. Central obesity present. Mouth:  No deformity or lesions. Neck:  Supple; no masses or thyromegaly. No significant cervical adenopathy. Lungs:  Clear throughout to auscultation.   No wheezes, crackles, or rhonchi. No acute distress. Heart:  Regular rate and rhythm; no murmurs, clicks, rubs,  or gallops. Abdomen: Significantly obese. Positive bowel sounds. No fluid wave or shifting dullness. No obvious mass or organomegaly.  Pulses:  Normal pulses noted. Extremities:  Trace LE edema   Impression:  Pleasant 66 year old gentleman with Karlene Lineman cirrhosis-remains well compensated. On chronic anticoagulation for cardiac issues. GERD well controlled. Oropharyngeal just pages only a intermittent aggravation for the  patient. No further  specific therapy of available.  History of colonic adenoma; due for surveillance colonoscopy 2021.  No need for a follow-up EGD unless new symptoms develop.  Recommendations:  Continue Nadolol daily  Continue Aldactone  Plan to get a liver ultrasound every 6 months  Continue Protonix daily  Weight loss, regular aerobic exercise  Regular coffee intake may help his liver  Repeat Colonoscopy in 2021.  Office visit in 6 months         Notice: This dictation was prepared with Dragon dictation along with smaller phrase technology. Any transcriptional errors that result from this process are unintentional and may not be corrected upon review.

## 2016-07-01 NOTE — Patient Instructions (Signed)
Continue Nadolol daily  Continue Aldactone  Plan to get a liver ultrasound every 6 months  Continue Protonix daily  Weight loss, regular aerobic exercise  Regular coffee intake will help your liver  Repeat Colonoscopy in 2021.  Office visit in 6 months

## 2016-07-07 ENCOUNTER — Ambulatory Visit: Payer: Medicare HMO | Admitting: Emergency Medicine

## 2016-07-24 ENCOUNTER — Encounter (HOSPITAL_COMMUNITY): Payer: Self-pay | Admitting: *Deleted

## 2016-07-24 ENCOUNTER — Emergency Department (HOSPITAL_COMMUNITY)
Admission: EM | Admit: 2016-07-24 | Discharge: 2016-07-25 | Disposition: A | Discharge status: Home / Self Care | Payer: Medicare HMO | Attending: Emergency Medicine | Admitting: Emergency Medicine

## 2016-07-24 DIAGNOSIS — Z794 Long term (current) use of insulin: Secondary | ICD-10-CM | POA: Insufficient documentation

## 2016-07-24 DIAGNOSIS — J449 Chronic obstructive pulmonary disease, unspecified: Secondary | ICD-10-CM | POA: Diagnosis not present

## 2016-07-24 DIAGNOSIS — Z23 Encounter for immunization: Secondary | ICD-10-CM | POA: Diagnosis not present

## 2016-07-24 DIAGNOSIS — E039 Hypothyroidism, unspecified: Secondary | ICD-10-CM | POA: Insufficient documentation

## 2016-07-24 DIAGNOSIS — W268XXA Contact with other sharp object(s), not elsewhere classified, initial encounter: Secondary | ICD-10-CM | POA: Insufficient documentation

## 2016-07-24 DIAGNOSIS — Z950 Presence of cardiac pacemaker: Secondary | ICD-10-CM | POA: Diagnosis not present

## 2016-07-24 DIAGNOSIS — S81812A Laceration without foreign body, left lower leg, initial encounter: Secondary | ICD-10-CM | POA: Diagnosis not present

## 2016-07-24 DIAGNOSIS — Z7901 Long term (current) use of anticoagulants: Secondary | ICD-10-CM | POA: Insufficient documentation

## 2016-07-24 DIAGNOSIS — F17210 Nicotine dependence, cigarettes, uncomplicated: Secondary | ICD-10-CM | POA: Insufficient documentation

## 2016-07-24 DIAGNOSIS — I5033 Acute on chronic diastolic (congestive) heart failure: Secondary | ICD-10-CM | POA: Diagnosis not present

## 2016-07-24 DIAGNOSIS — E119 Type 2 diabetes mellitus without complications: Secondary | ICD-10-CM | POA: Insufficient documentation

## 2016-07-24 DIAGNOSIS — Y939 Activity, unspecified: Secondary | ICD-10-CM | POA: Insufficient documentation

## 2016-07-24 DIAGNOSIS — Z85828 Personal history of other malignant neoplasm of skin: Secondary | ICD-10-CM | POA: Diagnosis not present

## 2016-07-24 DIAGNOSIS — Y929 Unspecified place or not applicable: Secondary | ICD-10-CM | POA: Diagnosis not present

## 2016-07-24 DIAGNOSIS — Y999 Unspecified external cause status: Secondary | ICD-10-CM | POA: Diagnosis not present

## 2016-07-24 DIAGNOSIS — S8992XA Unspecified injury of left lower leg, initial encounter: Secondary | ICD-10-CM | POA: Diagnosis present

## 2016-07-24 DIAGNOSIS — I110 Hypertensive heart disease with heart failure: Secondary | ICD-10-CM | POA: Diagnosis not present

## 2016-07-24 DIAGNOSIS — R69 Illness, unspecified: Secondary | ICD-10-CM | POA: Diagnosis not present

## 2016-07-24 MED ORDER — LIDOCAINE HCL (PF) 2 % IJ SOLN
INTRAMUSCULAR | Status: AC
  Filled 2016-07-24: qty 10

## 2016-07-24 NOTE — ED Triage Notes (Signed)
Pt c/o laceration to left lower leg, pressure bandage applied in triage due to bleeding, is currently taking blood thinner,

## 2016-07-25 ENCOUNTER — Ambulatory Visit: Payer: Medicare HMO | Admitting: "Endocrinology

## 2016-07-25 DIAGNOSIS — J449 Chronic obstructive pulmonary disease, unspecified: Secondary | ICD-10-CM | POA: Diagnosis not present

## 2016-07-25 DIAGNOSIS — Z794 Long term (current) use of insulin: Secondary | ICD-10-CM | POA: Diagnosis not present

## 2016-07-25 DIAGNOSIS — I5033 Acute on chronic diastolic (congestive) heart failure: Secondary | ICD-10-CM | POA: Diagnosis not present

## 2016-07-25 DIAGNOSIS — R69 Illness, unspecified: Secondary | ICD-10-CM | POA: Diagnosis not present

## 2016-07-25 DIAGNOSIS — I110 Hypertensive heart disease with heart failure: Secondary | ICD-10-CM | POA: Diagnosis not present

## 2016-07-25 DIAGNOSIS — Z7901 Long term (current) use of anticoagulants: Secondary | ICD-10-CM | POA: Diagnosis not present

## 2016-07-25 DIAGNOSIS — W268XXA Contact with other sharp object(s), not elsewhere classified, initial encounter: Secondary | ICD-10-CM | POA: Diagnosis not present

## 2016-07-25 DIAGNOSIS — S81812A Laceration without foreign body, left lower leg, initial encounter: Secondary | ICD-10-CM | POA: Diagnosis not present

## 2016-07-25 DIAGNOSIS — E119 Type 2 diabetes mellitus without complications: Secondary | ICD-10-CM | POA: Diagnosis not present

## 2016-07-25 DIAGNOSIS — E039 Hypothyroidism, unspecified: Secondary | ICD-10-CM | POA: Diagnosis not present

## 2016-07-25 LAB — CBG MONITORING, ED: GLUCOSE-CAPILLARY: 112 mg/dL — AB (ref 65–99)

## 2016-07-25 MED ORDER — TETANUS-DIPHTH-ACELL PERTUSSIS 5-2.5-18.5 LF-MCG/0.5 IM SUSP
0.5000 mL | Freq: Once | INTRAMUSCULAR | Status: AC
  Administered 2016-07-25: 0.5 mL via INTRAMUSCULAR
  Filled 2016-07-25: qty 0.5

## 2016-07-25 MED ORDER — SULFAMETHOXAZOLE-TRIMETHOPRIM 800-160 MG PO TABS: 1.0000 | ORAL_TABLET | Freq: Two times a day (BID) | ORAL | 0 refills | Status: AC

## 2016-07-25 MED ORDER — HYDROCODONE-ACETAMINOPHEN 5-325 MG PO TABS
2.0000 | ORAL_TABLET | Freq: Once | ORAL | Status: AC
  Administered 2016-07-25: 2 via ORAL
  Filled 2016-07-25: qty 2

## 2016-07-25 MED ORDER — HYDROCODONE-ACETAMINOPHEN 5-325 MG PO TABS: 2.0000 | ORAL_TABLET | ORAL | 0 refills | Status: DC | PRN

## 2016-07-25 MED ORDER — SULFAMETHOXAZOLE-TRIMETHOPRIM 800-160 MG PO TABS
1.0000 | ORAL_TABLET | Freq: Once | ORAL | Status: AC
  Administered 2016-07-25: 1 via ORAL
  Filled 2016-07-25: qty 1

## 2016-07-25 NOTE — Discharge Instructions (Signed)
Suture removal in 10 days.  Watch for any sign of infection

## 2016-07-25 NOTE — ED Provider Notes (Signed)
[name] Provider Note   CSN: [id number] Arrival date & time: 07/24/16  2258     History   Chief Complaint No chief complaint on file.   HPI Ralph [name] is a 66 y.o. male.  The history is provided by the patient. No language interpreter was used.  Laceration   The incident occurred less than 1 hour ago. The laceration is located on the left leg. The laceration is 11-20 cm in size. The laceration mechanism was a a metal edge. The pain is moderate. The pain has been constant since onset. He reports no foreign bodies present.  Pt was trying to move a generator and it cut his leg.  Pt complains of a lot of bleeding.  Pt reports toes feel numb.    Past Medical History:  Diagnosis Date  . Anginal pain ([place])   . Arthritis    "back; fingers" (01/06/2012)  . Atrial flutter [facility])    s/p EPS +RF ablation of typical atrial flutter April 2015  . Cancer ([place])    skin cancer  . CHB (complete heart block) ([place])   . CHF (congestive heart failure) ([place]) 01/06/2012  . Chronic lower back pain   . 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 Coughing up blood    "comes from my throat" (01/06/2012)  . DDD (degenerative disc disease), lumbar   . Depressed   . Difficult intubation    Eschmann stylet used in 2002 and 2007; "trouble waking up afterwards" (01/06/2012)  . Emphysema   . Fatty liver disease, nonalcoholic   . GERD (gastroesophageal reflux disease)   . H/O hiatal hernia   . Headache   . Hypertension   . Hypothyroidism   . Pacemaker   . Pneumonia Aug 2016  . Presence of permanent cardiac pacemaker 9/292013   [employer]  . Sinus pause 01/06/2012   5.2 seconds  . Sleep apnea    "don't wear mask" (01/06/2012)  . Stroke [facility])    pt states that he might have had a stroke not sure  . Type II diabetes mellitus ([place])   . Varicose vein    of esophagus    Patient Active Problem List   Diagnosis Date Noted  . Hyperlipidemia 04/02/2015  . Essential hypertension, benign  04/02/2015  . Other specified hypothyroidism 04/02/2015  . History of colonic polyps   . Encounter for screening colonoscopy 05/07/2014  . Psoriasis 10/15/2013  . Hypoxemia 09/05/2013  . SVT (supraventricular tachycardia) ([place]) 06/06/2013  . COPD (chronic obstructive pulmonary disease) ([place]) 06/06/2013  . Cardiac pacemaker in situ 06/06/2013  . Pacemaker 05/22/2013  . Cirrhosis ([place]) 01/22/2013  . Dyspepsia 01/22/2013  . Anemia 01/22/2013  . Atrial fibrillation ([place]) 11/21/2012  . Atrial flutter ([place]) 11/21/2012  . Chest pain 11/20/2012  .  Acute on chronic diastolic heart failure, predominantly Right heart failure 01/09/2012  . Syncope 01/06/2012  . Sinus arrest, 5 second pause on event monitor 01/06/2012  . Type 2 diabetes mellitus, uncontrolled ([place]) 01/06/2012  . Obesity 01/06/2012  . Smoker 01/06/2012  . Nocturnal hypoxemia 01/06/2012  . DJD, on disability secondary to back pain, surg X 3 01/06/2012  . ABDOMINAL PAIN, RIGHT UPPER QUADRANT 06/14/2009  . ABDOMINAL PAIN 05/14/2009  . Hepatic cirrhosis ([place]) 01/13/2009  . GERD 12/04/2008  . GASTRITIS, ACUTE 12/04/2008  . HOARSENESS 12/04/2008  . CHEST PAIN 12/04/2008  . Dysphagia, pharyngoesophageal phase 12/04/2008    Past Surgical History:  Procedure Laterality Date  .  ATRIAL FLUTTER ABLATION N/A 07/10/2013   Procedure: ATRIAL FLUTTER ABLATION;  Surgeon: Evans Lance, MD;  Location: [facility] CATH LAB;  Service: Cardiovascular;  Laterality: N/A;  . BACK SURGERY    . CHOLECYSTECTOMY  1993  . COLONOSCOPY  11/08/2004   OMV:EHMCNO rectum, colon, TI.  Marland Kitchen COLONOSCOPY N/A 05/28/2014   Dr. Gala Romney: Redundant colon. single colonic polyp removed as described above. Tubular adenoma  . ESOPHAGEAL DILATION N/A 05/28/2014   Procedure: ESOPHAGEAL DILATION;  Surgeon: Daneil Dolin, MD;  [facility];  Service: Endoscopy;  Laterality: N/A;  . ESOPHAGOGASTRODUODENOSCOPY  11/08/2004   BSJ:GGEZMO esophageal erosions consistent with  erosive reflux esophagitis/Areas of hemorrhage and nodularity of the fundal mucosa of uncertain significance, biopsied.  Small hiatal hernia, otherwise normal stomach  . ESOPHAGOGASTRODUODENOSCOPY  2010   Dr. Gala Romney: 3 columns Grade 1 varices, erosive esophagitis, HH, portal gastropathy, normal D1, D2  . ESOPHAGOGASTRODUODENOSCOPY N/A 05/28/2014   Dr. Gala Romney: MIld erosive reflux esophagitis. Grade 1 esophageal varices. Patent esophagus. No dilation performed. Hiatal hernia.   Marland Kitchen ESOPHAGOGASTRODUODENOSCOPY (EGD) WITH ESOPHAGEAL DILATION N/A 02/14/2013   QHU:TMLYY 1 esophageal varices. Abnormal distal esophagus/status post biopsy after Maloney dilation. Portal gastropathy. Antral erosions-status post biopsy. path negative for H.pylori, benign path.  . LUMBAR [place]; ~ 1995; ~ 1996  . NASAL SEPTUM SURGERY  1992  . nuclear stress test  10/19/2004   No ischemia  . PERMANENT PACEMAKER INSERTION  01/08/2012   CHB  . PERMANENT PACEMAKER INSERTION N/A 01/09/2012   Procedure: PERMANENT PACEMAKER INSERTION;  Surgeon: Sanda Klein, MD;  Location: [facility] CATH LAB;  Service: Cardiovascular;  Laterality: N/A;  . POSTERIOR FUSION LUMBAR SPINE  1999   L4-5  . SPINAL CORD STIMULATOR IMPLANT  2006  . SPINAL CORD STIMULATOR REMOVAL N/A 01/27/2015   Procedure: LUMBAR SPINAL CORD STIMULATOR REMOVAL;  Surgeon: Kristeen Miss, MD;  Location: [facility] NEURO ORS;  Service: Neurosurgery;  Laterality: N/A;  LUMBAR SPINAL CORD STIMULATOR REMOVAL  . TONSILLECTOMY AND ADENOIDECTOMY  1992  . US ECHOCARDIOGRAPHY  12/28/2011   mild LVH,mild mitral annulara ca+,mild MR  . Warthin's tumor excision  1990's   right       Home Medications    Prior to Admission medications   Medication Sig Start Date End Date Taking? Authorizing Provider  ANORO ELLIPTA 62.5-25 MCG/INH AEPB INHALE 1 PUFF ONCE DAILY Patient not taking: Reported on 07/01/2016 01/19/15   Collene Gobble, MD  Apremilast (OTEZLA) 30 MG TABS Take 30 mg by mouth 2 (two)  times daily.    Historical Provider, MD  buPROPion (WELLBUTRIN SR) 150 MG 12 hr tablet Take 150 mg by mouth 2 (two) times daily.    Historical Provider, MD  Cholecalciferol (VITAMIN D PO) Take 1 tablet by mouth daily.    Historical Provider, MD  Cyanocobalamin (VITAMIN B 12 PO) Take 1 tablet by mouth daily.    Historical Provider, MD  diazepam (VALIUM) 10 MG tablet Take 10 mg by mouth every 6 (six) hours as needed for anxiety.    Historical Provider, MD  diltiazem (CARDIZEM CD) 180 MG 24 hr capsule TAKE ONE CAPSULE BY MOUTH EVERY DAY 05/02/16   Arnoldo Lenis, MD  escitalopram (LEXAPRO) 10 MG tablet Take 10 mg by mouth daily.    Historical Provider, MD  flecainide (TAMBOCOR) 100 MG tablet TAKE 1 TABLET BY MOUTH TWICE A DAY 03/09/16   Evans Lance, MD  furosemide (LASIX) 20 MG tablet Take 2 tablets (40 mg  total) by mouth daily as needed (swelling). 01/27/16   Arnoldo Lenis, MD  HYDROcodone-acetaminophen (NORCO/VICODIN) 5-325 MG tablet Take 2 tablets by mouth every 4 (four) hours as needed. 07/25/16   Fransico Meadow, PA-C  Insulin Detemir (LEVEMIR FLEXTOUCH) 100 UNIT/ML Pen Inject 50 Units into the skin at bedtime. 01/19/16   Cassandria Anger, MD  INVOKANA 100 MG TABS tablet TAKE 1 TABLET (100 MG TOTAL) BY MOUTH DAILY BEFORE BREAKFAST. 05/30/16   Cassandria Anger, MD  LEVEMIR 100 UNIT/ML injection INJECT 70 UNITS SUBCUTANEOUS AT BEDTIME Patient not taking: Reported on 07/01/2016 06/20/16   Cassandria Anger, MD  metFORMIN (GLUCOPHAGE) 1000 MG tablet TAKE 1 TABLET BY MOUTH TWICE A DAY 05/30/16   Cassandria Anger, MD  Multiple Vitamin (MULTIVITAMIN) tablet Take 1 tablet by mouth daily.    Historical Provider, MD  nadolol (CORGARD) 40 MG tablet Take 1 tablet (40 mg) in the morning and 1/2 (20 mg) tablet by mouth every evening 08/19/15   Mahala Menghini, PA-C  Omega-3 Fatty Acids (FISH OIL) 1200 MG CAPS Take by mouth 2 (two) times daily.    Historical Provider, MD  ondansetron (ZOFRAN) 4  MG tablet Take 1 tablet (4 mg total) by mouth every 8 (eight) hours as needed for nausea or vomiting. 07/21/14   Mahala Menghini, PA-C  ONETOUCH VERIO test strip USE TO TEST BLOOD SUGAR FOUR TIMES A DAY 11/20/15   Cassandria Anger, MD  OXYGEN Inhale 2 L into the lungs at bedtime.    Historical Provider, MD  pantoprazole (PROTONIX) 40 MG tablet TAKE 1 TABLET (40 MG TOTAL) BY MOUTH DAILY. 01/01/16   Carlis Stable, NP  rivaroxaban (XARELTO) 20 MG TABS tablet TAKE 1 TABLET BY MOUTH EVERY DAY WITH DINNER 10/20/15   Arnoldo Lenis, MD  simvastatin (ZOCOR) 20 MG tablet TAKE 1 TABLET (20 MG TOTAL) BY MOUTH EVERY EVENING. 05/02/16   Arnoldo Lenis, MD  spironolactone (ALDACTONE) 50 MG tablet TAKE 1 TABLET (50 MG TOTAL) BY MOUTH 2 (TWO) TIMES DAILY. 07/29/15   Mahala Menghini, PA-C  sulfamethoxazole-trimethoprim (BACTRIM DS,SEPTRA DS) 800-160 MG tablet Take 1 tablet by mouth 2 (two) times daily. 07/25/16 08/01/16  Hollace Kinnier Lester Platas, PA-C  SYNTHROID 112 MCG tablet TAKE 1 TABLET (112 MCG TOTAL) BY MOUTH DAILY BEFORE BREAKFAST. 06/27/16   Cassandria Anger, MD  traMADol (ULTRAM) 50 MG tablet Take 50 mg by mouth every 6 (six) hours as needed.    Historical Provider, MD    Family History Family History  Problem Relation Age of Onset  . Cancer Mother     Deceased, 74  . Ovarian cancer Mother   . Arrhythmia Father   . Other Father     Deceased 3  . Stroke Brother   . Stroke Brother   . Stroke Sister   . Crohn's disease Daughter     Social History Social History  Substance Use Topics  . Smoking status: Current Every Day Smoker    Packs/day: 0.25    Years: 45.00    Types: Cigarettes    Start date: 04/11/1968    Last attempt to quit: 04/12/2015  . Smokeless tobacco: Never Used     Comment: Quit x 8 months this time  . Alcohol use No     Comment: "quit alcohol 2011" Previously drinking socially about twice per month     Allergies   Nitroglycerin   Review of Systems Review of Systems  Skin:  Positive for wound.  All other systems reviewed and are negative.    Physical Exam Updated Vital Signs BP (!) 128/47   Pulse 74   Temp 97.5 F (36.4 C) (Oral)   Resp 16   Ht 5\' 10"  (1.778 m)   Wt 101.6 kg   SpO2 95%   BMI 32.14 kg/m   Physical Exam  Constitutional: He is oriented to person, place, and time. He appears well-developed and well-nourished.  HENT:  Head: Normocephalic.  Musculoskeletal: Normal range of motion.  Neurological: He is alert and oriented to person, place, and time.  Skin:  16 cm laceration left lower leg, gapping L shaped from nv and ns intact  Psychiatric: He has a normal mood and affect.     ED Treatments / Results  Labs (all labs ordered are listed, but only abnormal results are displayed) Labs Reviewed - No data to display  EKG  EKG Interpretation None       Radiology No results found.  Procedures .Marland KitchenLaceration Repair Date/Time: 07/25/2016 12:14 AM Performed by: Fransico Meadow Authorized by: Fransico Meadow   Consent:    Consent obtained:  Verbal   Consent given by:  Patient Anesthesia (see MAR for exact dosages):    Anesthesia method:  None Laceration details:    Location:  Leg   Leg location:  L lower leg   Length (cm):  16   Depth (mm):  5 Repair type:    Repair type:  Simple Pre-procedure details:    Preparation:  Patient was prepped and draped in usual sterile fashion Exploration:    Wound exploration: wound explored through full range of motion and entire depth of wound probed and visualized     Contaminated: no   Treatment:    Area cleansed with:  Betadine   Amount of cleaning:  Standard   Irrigation solution:  Sterile saline   Irrigation volume:  100   Irrigation method:  Syringe   Visualized foreign bodies/material removed: no   Skin repair:    Repair method:  Sutures   Suture size:  5-0   Suture material:  Plain gut   Suture technique:  Simple interrupted   Number of sutures:  15 Approximation:     Approximation:  Loose   Vermilion border: well-aligned   Post-procedure details:    Dressing:  Bulky dressing   Patient tolerance of procedure:  Tolerated well, no immediate complications   (including critical care time)  Medications Ordered in ED Medications  lidocaine (XYLOCAINE) 2 % injection (not administered)  Tdap (BOOSTRIX) injection 0.5 mL (not administered)  HYDROcodone-acetaminophen (NORCO/VICODIN) 5-325 MG per tablet 2 tablet (not administered)  sulfamethoxazole-trimethoprim (BACTRIM DS,SEPTRA DS) 800-160 MG per tablet 1 tablet (not administered)     Initial Impression / Assessment and Plan / ED Course  I have reviewed the triage vital signs and the nursing notes.  Pertinent labs & imaging results that were available during my care of the patient were reviewed by me and considered in my medical decision making (see chart for details).     Meds ordered this encounter  Medications  . lidocaine (XYLOCAINE) 2 % injection    Poindexter, Melinda : cabinet override  . Tdap (BOOSTRIX) injection 0.5 mL  . HYDROcodone-acetaminophen (NORCO/VICODIN) 5-325 MG per tablet 2 tablet  . sulfamethoxazole-trimethoprim (BACTRIM DS,SEPTRA DS) 800-160 MG tablet    Sig: Take 1 tablet by mouth 2 (two) times daily.    Dispense:  14 tablet    Refill:  0    Order Specific Question:   Supervising Provider    Answer:   Sabra Heck, BRIAN [[id number]]  . HYDROcodone-acetaminophen (NORCO/VICODIN) 5-325 MG tablet    Sig: Take 2 tablets by mouth every 4 (four) hours as needed.    Dispense:  10 tablet    Refill:  0    Order Specific Question:   Supervising Provider    Answer:   Sabra Heck, BRIAN [[id number]]  . sulfamethoxazole-trimethoprim (BACTRIM DS,SEPTRA DS) 800-160 MG per tablet 1 tablet    Final Clinical Impressions(s) / ED Diagnoses   Final diagnoses:  Laceration of left lower extremity, initial encounter    New Prescriptions New Prescriptions   HYDROCODONE-ACETAMINOPHEN (NORCO/VICODIN) 5-325 MG  TABLET    Take 2 tablets by mouth every 4 (four) hours as needed.   SULFAMETHOXAZOLE-TRIMETHOPRIM (BACTRIM DS,SEPTRA DS) 800-160 MG TABLET    Take 1 tablet by mouth 2 (two) times daily.  Pt counseled on risk of infection.  Suture removal in 10 days   Fransico Meadow, PA-C 07/25/16 0016    Elnora Morrison, MD 07/26/16 [phone]

## 2016-07-27 ENCOUNTER — Ambulatory Visit: Payer: Medicare HMO | Admitting: "Endocrinology

## 2016-08-01 DIAGNOSIS — H16223 Keratoconjunctivitis sicca, not specified as Sjogren's, bilateral: Secondary | ICD-10-CM | POA: Diagnosis not present

## 2016-08-03 DIAGNOSIS — N182 Chronic kidney disease, stage 2 (mild): Secondary | ICD-10-CM | POA: Diagnosis not present

## 2016-08-03 DIAGNOSIS — Z1389 Encounter for screening for other disorder: Secondary | ICD-10-CM | POA: Diagnosis not present

## 2016-08-03 DIAGNOSIS — Z6833 Body mass index (BMI) 33.0-33.9, adult: Secondary | ICD-10-CM | POA: Diagnosis not present

## 2016-08-03 DIAGNOSIS — E6609 Other obesity due to excess calories: Secondary | ICD-10-CM | POA: Diagnosis not present

## 2016-08-06 ENCOUNTER — Other Ambulatory Visit: Payer: Self-pay | Admitting: Gastroenterology

## 2016-08-10 ENCOUNTER — Telehealth: Payer: Self-pay | Admitting: Internal Medicine

## 2016-08-10 ENCOUNTER — Other Ambulatory Visit: Payer: Self-pay | Admitting: Gastroenterology

## 2016-08-10 DIAGNOSIS — E1129 Type 2 diabetes mellitus with other diabetic kidney complication: Secondary | ICD-10-CM | POA: Diagnosis not present

## 2016-08-10 DIAGNOSIS — Z681 Body mass index (BMI) 19 or less, adult: Secondary | ICD-10-CM | POA: Diagnosis not present

## 2016-08-10 DIAGNOSIS — Z1389 Encounter for screening for other disorder: Secondary | ICD-10-CM | POA: Diagnosis not present

## 2016-08-10 DIAGNOSIS — Z4802 Encounter for removal of sutures: Secondary | ICD-10-CM | POA: Diagnosis not present

## 2016-08-10 NOTE — Telephone Encounter (Signed)
Checking status on application on Xarelto / tg

## 2016-08-10 NOTE — Telephone Encounter (Signed)
Spoke with [place] regarding Xarelto. App complete and approval or denial will be sent by Friday. Pt has part D insurance and will have to show he has paid $ 821.28 in meds before assistance is available.

## 2016-08-11 ENCOUNTER — Other Ambulatory Visit: Payer: Self-pay | Admitting: "Endocrinology

## 2016-08-11 DIAGNOSIS — E1159 Type 2 diabetes mellitus with other circulatory complications: Secondary | ICD-10-CM | POA: Diagnosis not present

## 2016-08-11 DIAGNOSIS — E1165 Type 2 diabetes mellitus with hyperglycemia: Secondary | ICD-10-CM | POA: Diagnosis not present

## 2016-08-11 DIAGNOSIS — Z794 Long term (current) use of insulin: Secondary | ICD-10-CM | POA: Diagnosis not present

## 2016-08-11 LAB — COMPREHENSIVE METABOLIC PANEL
ALBUMIN: 3.9 g/dL (ref 3.6–5.1)
ALT: 27 U/L (ref 9–46)
AST: 19 U/L (ref 10–35)
Alkaline Phosphatase: 97 U/L (ref 40–115)
BUN: 21 mg/dL (ref 7–25)
CALCIUM: 8.9 mg/dL (ref 8.6–10.3)
CHLORIDE: 102 mmol/L (ref 98–110)
CO2: 25 mmol/L (ref 20–31)
Creat: 0.98 mg/dL (ref 0.70–1.25)
Glucose, Bld: 158 mg/dL — ABNORMAL HIGH (ref 65–99)
Potassium: 5.1 mmol/L (ref 3.5–5.3)
Sodium: 137 mmol/L (ref 135–146)
Total Bilirubin: 0.4 mg/dL (ref 0.2–1.2)
Total Protein: 6.9 g/dL (ref 6.1–8.1)

## 2016-08-12 LAB — HEMOGLOBIN A1C
Hgb A1c MFr Bld: 6.6 % — ABNORMAL HIGH (ref <5.7)
MEAN PLASMA GLUCOSE: 143 mg/dL

## 2016-08-22 ENCOUNTER — Encounter: Payer: Self-pay | Admitting: "Endocrinology

## 2016-08-22 ENCOUNTER — Ambulatory Visit (INDEPENDENT_AMBULATORY_CARE_PROVIDER_SITE_OTHER): Payer: Medicare HMO | Admitting: "Endocrinology

## 2016-08-22 VITALS — BP 148/74 | HR 76 | Ht 70.0 in | Wt 226.0 lb

## 2016-08-22 DIAGNOSIS — I10 Essential (primary) hypertension: Secondary | ICD-10-CM

## 2016-08-22 DIAGNOSIS — E6609 Other obesity due to excess calories: Secondary | ICD-10-CM

## 2016-08-22 DIAGNOSIS — E1165 Type 2 diabetes mellitus with hyperglycemia: Secondary | ICD-10-CM

## 2016-08-22 DIAGNOSIS — E1159 Type 2 diabetes mellitus with other circulatory complications: Secondary | ICD-10-CM

## 2016-08-22 DIAGNOSIS — E782 Mixed hyperlipidemia: Secondary | ICD-10-CM

## 2016-08-22 DIAGNOSIS — E1129 Type 2 diabetes mellitus with other diabetic kidney complication: Secondary | ICD-10-CM | POA: Diagnosis not present

## 2016-08-22 DIAGNOSIS — E038 Other specified hypothyroidism: Secondary | ICD-10-CM | POA: Diagnosis not present

## 2016-08-22 DIAGNOSIS — Z6830 Body mass index (BMI) 30.0-30.9, adult: Secondary | ICD-10-CM

## 2016-08-22 DIAGNOSIS — Z794 Long term (current) use of insulin: Secondary | ICD-10-CM

## 2016-08-22 DIAGNOSIS — IMO0002 Reserved for concepts with insufficient information to code with codable children: Secondary | ICD-10-CM

## 2016-08-22 NOTE — Progress Notes (Signed)
Subjective:    Patient ID: Ralph [name], male    DOB: 1950/09/03, PCP Sharilyn Sites, MD   Past Medical History:  Diagnosis Date  . Anginal pain ([place])   . Arthritis    "back; fingers" (01/06/2012)  . Atrial flutter [facility])    s/p EPS +RF ablation of typical atrial flutter April 2015  . Cancer ([place])    skin cancer  . CHB (complete heart block) ([place])   . CHF (congestive heart failure) ([place]) 01/06/2012  . Chronic lower back pain   . 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 Coughing up blood    "comes from my throat" (01/06/2012)  . DDD (degenerative disc disease), lumbar   . Depressed   . Difficult intubation    Eschmann stylet used in 2002 and 2007; "trouble waking up afterwards" (01/06/2012)  . Emphysema   . Fatty liver disease, nonalcoholic   . GERD (gastroesophageal reflux disease)   . H/O hiatal hernia   . Headache   . Hypertension   . Hypothyroidism   . Pacemaker   . Pneumonia Aug 2016  . Presence of permanent cardiac pacemaker [id number]   [place]  . Sinus pause 01/06/2012   5.2 seconds  . Sleep apnea    "don't wear mask" (01/06/2012)  . Stroke [facility])    pt states that he might have had a stroke not sure  . Type II diabetes mellitus ([place])   . Varicose vein    of esophagus   Past Surgical History:  Procedure Laterality Date  . ATRIAL FLUTTER ABLATION N/A 07/10/2013   Procedure: ATRIAL FLUTTER ABLATION;  Surgeon: Evans Lance, MD;  Location: [facility] CATH LAB;  Service: Cardiovascular;  Laterality: N/A;  . BACK SURGERY    . CHOLECYSTECTOMY  1993  . COLONOSCOPY  11/08/2004   DDU:KGURKY rectum, colon, TI.  Marland Kitchen COLONOSCOPY N/A 05/28/2014   Dr. Gala Romney: Redundant colon. single colonic polyp removed as described above. Tubular adenoma  . ESOPHAGEAL DILATION N/A 05/28/2014   Procedure: ESOPHAGEAL DILATION;  Surgeon: Daneil Dolin, MD;  Location: [facility] ENDO SU[facility];  Service: Endoscopy;  Laterality: N/A;  . ESOPHAGOGASTRODUODENOSCOPY  11/08/2004   HCW:CBJSEG esophageal erosions  consistent with erosive reflux esophagitis/Areas of hemorrhage and nodularity of the fundal mucosa of uncertain significance, biopsied.  Small hiatal hernia, otherwise normal stomach  . ESOPHAGOGASTRODUODENOSCOPY  2010   Dr. Gala Romney: 3 columns Grade 1 varices, erosive esophagitis, HH, portal gastropathy, normal D1, D2  . ESOPHAGOGASTRODUODENOSCOPY N/A 05/28/2014   Dr. Gala Romney: MIld erosive reflux esophagitis. Grade 1 esophageal varices. Patent esophagus. No dilation performed. Hiatal hernia.   Marland Kitchen ESOPHAGOGASTRODUODENOSCOPY (EGD) WITH ESOPHAGEAL DILATION N/A 02/14/2013   BTD:VVOHY 1 esophageal varices. Abnormal distal esophagus/status post biopsy after Maloney dilation. Portal gastropathy. Antral erosions-status post biopsy. path negative for H.pylori, benign path.  . LUMBAR [place]; ~ 1995; ~ 1996  . NASAL SEPTUM SURGERY  1992  . nuclear stress test  10/19/2004   No ischemia  . PERMANENT PACEMAKER INSERTION  01/08/2012   CHB  . PERMANENT PACEMAKER INSERTION N/A 01/09/2012   Procedure: PERMANENT PACEMAKER INSERTION;  Surgeon: Sanda Klein, MD;  Location: [place] CATH LAB;  Service: Cardiovascular;  Laterality: N/A;  . POSTERIOR FUSION LUMBAR SPINE  1999   L4-5  . SPINAL CORD STIMULATOR IMPLANT  2006  . SPINAL CORD STIMULATOR REMOVAL N/A 01/27/2015   Procedure: LUMBAR SPINAL CORD STIMULATOR REMOVAL;  Surgeon: Kristeen Miss, MD;  Location: [place] NEURO ORS;  Service: Neurosurgery;  Laterality: N/A;  LUMBAR SPINAL CORD STIMULATOR REMOVAL  . TONSILLECTOMY AND ADENOIDECTOMY  1992  . US ECHOCARDIOGRAPHY  12/28/2011   mild LVH,mild mitral annulara ca+,mild MR  . Warthin's tumor excision  1990's   right   Social History   Social History  . Marital status: Married    Spouse name: N/A  . Number of children: N/A  . Years of education: N/A   Social History Main Topics  . Smoking status: Current Every Day Smoker    Packs/day: 0.25    Years: 45.00    Types: Cigarettes    Start date: 04/11/1968    Last  attempt to quit: 04/12/2015  . Smokeless tobacco: Never Used     Comment: Quit x 8 months this time  . Alcohol use No     Comment: "quit alcohol 2011" Previously drinking socially about twice per month  . Drug use: No  . Sexual activity: No   Other Topics Concern  . None   Social History Narrative   Lives with wife in a one story home.  Has 3 children.     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     Education: some college.   Outpatient Encounter Prescriptions as of 08/22/2016  Medication Sig  . ANORO ELLIPTA 62.5-25 MCG/INH AEPB INHALE 1 PUFF ONCE DAILY (Patient not taking: Reported on 07/01/2016)  . Apremilast (OTEZLA) 30 MG TABS Take 30 mg by mouth 2 (two) times daily.  Marland Kitchen buPROPion (WELLBUTRIN SR) 150 MG 12 hr tablet Take 150 mg by mouth 2 (two) times daily.  . Cholecalciferol (VITAMIN D PO) Take 1 tablet by mouth daily.  . Cyanocobalamin (VITAMIN B 12 PO) Take 1 tablet by mouth daily.  . diazepam (VALIUM) 10 MG tablet Take 10 mg by mouth every 6 (six) hours as needed for anxiety.  Marland Kitchen diltiazem (CARDIZEM CD) 180 MG 24 hr capsule TAKE ONE CAPSULE BY MOUTH EVERY DAY  . escitalopram (LEXAPRO) 10 MG tablet Take 10 mg by mouth daily.  . flecainide (TAMBOCOR) 100 MG tablet TAKE 1 TABLET BY MOUTH TWICE A DAY  . furosemide (LASIX) 20 MG tablet Take 2 tablets (40 mg total) by mouth daily as needed (swelling).  Marland Kitchen HYDROcodone-acetaminophen (NORCO/VICODIN) 5-325 MG tablet Take 2 tablets by mouth every 4 (four) hours as needed.  . Insulin Detemir (LEVEMIR FLEXTOUCH) 100 UNIT/ML Pen Inject 50 Units into the skin at bedtime.  . INVOKANA 100 MG TABS tablet TAKE 1 TABLET (100 MG TOTAL) BY MOUTH DAILY BEFORE BREAKFAST. (Patient not taking: Reported on 08/22/2016)  . LEVEMIR 100 UNIT/ML injection INJECT 70 UNITS SUBCUTANEOUS AT BEDTIME (Patient not taking: Reported on 07/01/2016)  . metFORMIN (GLUCOPHAGE) 1000 MG tablet TAKE 1 TABLET BY MOUTH TWICE A DAY  . Multiple Vitamin (MULTIVITAMIN) tablet Take 1  tablet by mouth daily.  . nadolol (CORGARD) 40 MG tablet TAKE 1 TABLET IN THE MORNING AND 1/2 TABLET BY MOUTH EVERY EVENING  . Omega-3 Fatty Acids (FISH OIL) 1200 MG CAPS Take by mouth 2 (two) times daily.  . ondansetron (ZOFRAN) 4 MG tablet Take 1 tablet (4 mg total) by mouth every 8 (eight) hours as needed for nausea or vomiting.  Glory Rosebush VERIO test strip USE TO TEST BLOOD SUGAR FOUR TIMES A DAY  . OXYGEN Inhale 2 L into the lungs at bedtime.  . pantoprazole (PROTONIX) 40 MG tablet TAKE 1 TABLET (40 MG TOTAL) BY MOUTH DAILY.  . rivaroxaban (XARELTO) 20 MG TABS tablet TAKE 1  TABLET BY MOUTH EVERY DAY WITH DINNER (Patient not taking: Reported on 08/22/2016)  . simvastatin (ZOCOR) 20 MG tablet TAKE 1 TABLET (20 MG TOTAL) BY MOUTH EVERY EVENING.  Marland Kitchen spironolactone (ALDACTONE) 50 MG tablet TAKE 1 TABLET (50 MG TOTAL) BY MOUTH 2 (TWO) TIMES DAILY.  . SYNTHROID 112 MCG tablet TAKE 1 TABLET (112 MCG TOTAL) BY MOUTH DAILY BEFORE BREAKFAST.  Marland Kitchen traMADol (ULTRAM) 50 MG tablet Take 50 mg by mouth every 6 (six) hours as needed.   No facility-administered encounter medications on file as of 08/22/2016.    ALLERGIES: Allergies  Allergen Reactions  . Nitroglycerin Hives, Swelling and Rash   VACCINATION STATUS: Immunization History  Administered Date(s) Administered  . Influenza Split 01/09/2013  . Influenza,inj,Quad PF,36+ Mos 01/09/2014, 12/23/2014  . Pneumococcal Polysaccharide-23 01/10/2012  . Tdap 07/25/2016    Diabetes  He presents for his follow-up diabetic visit. He has type 2 diabetes mellitus. Onset time: He was diagnosed at approximate age of 62 years. His disease course has been stable. There are no hypoglycemic associated symptoms. Pertinent negatives for hypoglycemia include no confusion, headaches, pallor or seizures. There are no diabetic associated symptoms. Pertinent negatives for diabetes include no chest pain, no fatigue, no polydipsia, no polyphagia, no polyuria and no weakness.  There are no hypoglycemic complications. Symptoms are stable. Diabetic complications include a CVA, heart disease and nephropathy. Risk factors for coronary artery disease include diabetes mellitus, dyslipidemia, hypertension, male sex, obesity, sedentary lifestyle and tobacco exposure. Current diabetic treatment includes intensive insulin program. He is compliant with treatment most of the time. His weight is decreasing steadily. He is following a diabetic diet. When asked about meal planning, he reported none. He participates in exercise intermittently. There is no change in his home blood glucose trend. His breakfast blood glucose range is generally 130-140 mg/dl. His overall blood glucose range is 130-140 mg/dl.  Hyperlipidemia  This is a chronic problem. The current episode started more than 1 year ago. Pertinent negatives include no chest pain, myalgias or shortness of breath. Current antihyperlipidemic treatment includes statins. Risk factors for coronary artery disease include dyslipidemia, diabetes mellitus, hypertension, male sex and a sedentary lifestyle.  Hypertension  This is a chronic problem. The current episode started more than 1 year ago. Pertinent negatives include no chest pain, headaches, neck pain, palpitations or shortness of breath. Risk factors for coronary artery disease include diabetes mellitus, dyslipidemia, obesity, sedentary lifestyle and smoking/tobacco exposure. Compliance problems include diet.  Hypertensive end-organ damage includes CVA. Identifiable causes of hypertension include a thyroid problem.  Thyroid Problem  Presents for follow-up visit. Patient reports no cold intolerance, constipation, diarrhea, fatigue, heat intolerance or palpitations. The symptoms have been improving. Past treatments include levothyroxine. His past medical history is significant for hyperlipidemia.     Review of Systems  Constitutional: Negative for fatigue and unexpected weight change.   HENT: Negative for dental problem, mouth sores and trouble swallowing.   Eyes: Negative for visual disturbance.  Respiratory: Negative for cough, choking, chest tightness, shortness of breath and wheezing.   Cardiovascular: Negative for chest pain, palpitations and leg swelling.  Gastrointestinal: Negative for abdominal distention, abdominal pain, constipation, diarrhea, nausea and vomiting.  Endocrine: Negative for cold intolerance, heat intolerance, polydipsia, polyphagia and polyuria.  Genitourinary: Negative for dysuria, flank pain, hematuria and urgency.  Musculoskeletal: Negative for back pain, gait problem, myalgias and neck pain.  Skin: Negative for pallor, rash and wound.  Neurological: Negative for seizures, syncope, weakness, numbness and headaches.  Psychiatric/Behavioral:  Negative.  Negative for confusion and dysphoric mood.    Objective:    BP (!) 148/74   Pulse 76   Ht 5\' 10"  (1.778 m)   Wt 226 lb (102.5 kg)   BMI 32.43 kg/m   Wt Readings from Last 3 Encounters:  08/22/16 226 lb (102.5 kg)  07/24/16 224 lb (101.6 kg)  07/01/16 224 lb 6.4 oz (101.8 kg)    Physical Exam  Constitutional: He is oriented to person, place, and time. He appears well-developed and well-nourished. He is cooperative. No distress.  HENT:  Head: Normocephalic and atraumatic.  Eyes: EOM are normal.  Neck: Normal range of motion. Neck supple. No tracheal deviation present. No thyromegaly present.  Cardiovascular: Normal rate, S1 normal, S2 normal and normal heart sounds.  Exam reveals no gallop.   No murmur heard. Pulses:      Dorsalis pedis pulses are 1+ on the right side, and 1+ on the left side.       Posterior tibial pulses are 1+ on the right side, and 1+ on the left side.  Pulmonary/Chest: Breath sounds normal. No respiratory distress. He has no wheezes.  Abdominal: Soft. Bowel sounds are normal. He exhibits no distension. There is no tenderness. There is no guarding and no CVA  tenderness.  Musculoskeletal: He exhibits no edema.       Right shoulder: He exhibits no swelling and no deformity.  Neurological: He is alert and oriented to person, place, and time. He has normal strength and normal reflexes. No cranial nerve deficit or sensory deficit. Gait normal.  Skin: Skin is warm and dry. No rash noted. No cyanosis. Nails show no clubbing.  Psychiatric: He has a normal mood and affect. His speech is normal and behavior is normal. Judgment and thought content normal. Cognition and memory are normal.    CMP     Component Value Date/Time   NA 137 08/11/2016 0836   K 5.1 08/11/2016 0836   CL 102 08/11/2016 0836   CO2 25 08/11/2016 0836   GLUCOSE 158 (H) 08/11/2016 0836   BUN 21 08/11/2016 0836   CREATININE 0.98 08/11/2016 0836   CALCIUM 8.9 08/11/2016 0836   PROT 6.9 08/11/2016 0836   ALBUMIN 3.9 08/11/2016 0836   ALBUMIN 3.3 12/17/2012   AST 19 08/11/2016 0836   AST 23 12/17/2012   ALT 27 08/11/2016 0836   ALKPHOS 97 08/11/2016 0836   ALKPHOS 423 12/17/2012   BILITOT 0.4 08/11/2016 0836   BILITOT 0.7 12/17/2012   GFRNONAA 87 01/04/2016 0809   GFRAA >89 01/04/2016 0809   Diabetic Labs (most recent): Lab Results  Component Value Date   HGBA1C 6.6 (H) 08/11/2016   HGBA1C 6.3 (H) 04/13/2016   HGBA1C 5.7 (H) 01/04/2016   Lipid Panel     Component Value Date/Time   CHOL 111 (L) 12/11/2015 0753   TRIG 276 (H) 12/11/2015 0753   HDL 30 (L) 12/11/2015 0753   CHOLHDL 3.7 12/11/2015 0753   VLDL 55 (H) 12/11/2015 0753   LDLCALC 26 12/11/2015 0753    Results for ADLER, CHARTRAND (MRN [id number]) as of 04/21/2016 14:05  Ref. Range 04/13/2016 13:05  TSH Latest Ref Range: 0.40 - 4.50 mIU/L 0.75  T4,Free(Direct) Latest Ref Range: 0.8 - 1.8 ng/dL 1.8   Assessment & Plan:   1. Uncontrolled type 2 diabetes mellitus with complication, with long-term current use of insulin ([place])  - His diabetes is  complicated by coronary artery disease/cardiomyopathy,  cerebrovascular accident, and patient remains  at a high risk for more acute and chronic complications of diabetes which include CAD, CVA, CKD, retinopathy, and neuropathy. These are all discussed in detail with the patient.  Patient came with controlled glucose profile, and  recent A1c of  6.6% improving from 7.5% .  Glucose logs and insulin administration records pertaining to this visit,  to be scanned into patient's records.  Recent labs reviewed.   - I have re-counseled the patient on diet management and weight loss  by adopting a carbohydrate restricted / protein rich  Diet.  - Suggestion is made for patient to avoid simple carbohydrates   from their diet including Cakes , Desserts, Ice Cream,  Soda (  diet and regular) , Sweet Tea , Candies,  Chips, Cookies, Artificial Sweeteners,   and "Sugar-free" Products .  This will help patient to have stable blood glucose profile and potentially avoid unintended  Weight gain.  - Patient is advised to stick to a routine mealtimes to eat 3 meals  a day and avoid unnecessary snacks ( to snack only to correct hypoglycemia).  - The patient  has been  scheduled with Jearld Fenton, RDN, CDE for individualized DM education.  - I have approached patient with the following individualized plan to manage diabetes and patient agrees.  - He  continued to do well. I will continue to adjust his insulin regimen.  - Resume Levemir  50 units qhs, continue to hold NovoLog for now, continue monitoring of BG ac  breakfast  And when necessary .  -Adjustment parameters for hypo and hyperglycemia were given in a written document to patient.  -Patient is encouraged to call clinic for blood glucose levels less than 70 or above 300 mg /dl.  -I will continue Metformin 1000mg  po BID. - He is awaiting a decision on Invokana  coverage. If covered he will resume Invokana 100 mg by mouth daily in the morning.     - Patient specific target  for A1c; LDL, HDL, Triglycerides,  and  Waist Circumference were discussed in detail.  2) BP/HTN: Controlled. Continue current medications . 3) Lipids/HPL:  continue statins. 4)  Weight/Diet: He has lost significant weight, about 30 pounds since last year, CDE consult in progress, exercise, and carbohydrates information provided.  5)  hypothyroidism -I will continue  levothyroxine to 112 g by mouth every morning.  - We discussed about correct intake of levothyroxine, at fasting, with water, separated by at least 30 minutes from breakfast, and separated by more than 4 hours from calcium, iron, multivitamins, acid reflux medications (PPIs). -Patient is made aware of the fact that thyroid hormone replacement is needed for life, dose to be adjusted by periodic monitoring of thyroid function tests.   6) Chronic Care/Health Maintenance:  -Patient  is Statin medications and encouraged to continue to follow up with Ophthalmology, Podiatrist at least yearly or according to recommendations, and advised to quit smoking. I have recommended yearly flu vaccine and pneumonia vaccination at least every 5 years; moderate intensity exercise for up to 150 minutes weekly; and  sleep for at least 7 hours a day.  - 25 minutes of time was spent on the care of this patient , 50% of which was applied for counseling on diabetes complications and their preventions.  - I advised patient to maintain close follow up with Sharilyn Sites, MD for primary care needs.  Patient is asked to bring meter and  blood glucose logs during their next visit.   Follow up plan: -  Return in about 3 months (around 11/22/2016) for follow up with pre-visit labs, meter, and logs.  Glade Lloyd, MD Phone: [phone]  Fax: [phone]   08/22/2016, 3:51 PM

## 2016-08-22 NOTE — Patient Instructions (Signed)
Advice for weight management -For most of us the best way to lose weight is by diet management. Generally speaking, diet management means restricting carbohydrate consumption to minimum possible (and to unprocessed or minimally processed complex starch) and increasing protein intake (animal or plant source), fruits, and vegetables.  -Sticking to a routine mealtime to eat 3 meals a day and avoiding unnecessary snacks is shown to have a big role in weight control.  -It is better to avoid simple carbohydrates including: Cakes, Desserts, Ice Cream, Soda (diet and regular), Sweet Tea, Candies, Chips, Cookies, Artificial Sweeteners, and "Sugar-free" Products.   -Exercise: 30 minutes a day 3-4 days a week, or 150 minutes a week. Combine stretch, strength, and aerobic activities. You may seek evaluation by your heart doctor prior to initiating exercise if you have high risk for heart disease.  -If you are interested, we can schedule a visit with Ralph [name], RDN, CDE for individualized nutrition education.  

## 2016-08-25 ENCOUNTER — Other Ambulatory Visit: Payer: Self-pay | Admitting: Cardiology

## 2016-09-03 ENCOUNTER — Other Ambulatory Visit: Payer: Self-pay | Admitting: Internal Medicine

## 2016-09-03 ENCOUNTER — Other Ambulatory Visit: Payer: Self-pay | Admitting: "Endocrinology

## 2016-09-07 DIAGNOSIS — Z6833 Body mass index (BMI) 33.0-33.9, adult: Secondary | ICD-10-CM | POA: Diagnosis not present

## 2016-09-07 DIAGNOSIS — L089 Local infection of the skin and subcutaneous tissue, unspecified: Secondary | ICD-10-CM | POA: Diagnosis not present

## 2016-09-14 ENCOUNTER — Ambulatory Visit (HOSPITAL_COMMUNITY): Payer: Medicare HMO | Attending: Family Medicine | Admitting: Physical Therapy

## 2016-09-14 DIAGNOSIS — M79605 Pain in left leg: Secondary | ICD-10-CM | POA: Diagnosis present

## 2016-09-14 DIAGNOSIS — Z5189 Encounter for other specified aftercare: Secondary | ICD-10-CM | POA: Diagnosis not present

## 2016-09-14 DIAGNOSIS — S81812S Laceration without foreign body, left lower leg, sequela: Secondary | ICD-10-CM | POA: Diagnosis present

## 2016-09-14 NOTE — Therapy (Signed)
[place], [place], [address] Phone: [phone]   Fax:  [phone]  Wound Care Evaluation  Patient Details  Name: TYAN DY MRN: [id number] Date of Birth: 1951-02-05 Referring Provider:  Micheline Rough  Encounter Date: 09/14/2016      PT End of Session - 09/14/16 1218    Visit Number 1   Number of Visits 8   Date for PT Re-Evaluation 10/14/16   Authorization - Visit Number 1   Authorization - Number of Visits 8   PT Start Time 7169   PT Stop Time 1115   PT Time Calculation (min) 35 min   Activity Tolerance Patient tolerated treatment well   Behavior During Therapy [facility] for tasks assessed/performed      Past Medical History:  Diagnosis Date  . Anginal pain ([place])   . Arthritis    "back; fingers" (01/06/2012)  . Atrial flutter [facility])    s/p EPS +RF ablation of typical atrial flutter April 2015  . Cancer ([place])    skin cancer  . CHB (complete heart block) ([place])   . CHF (congestive heart failure) ([place]) 01/06/2012  . Chronic lower back pain   . 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 Coughing up blood    "comes from my throat" (01/06/2012)  . DDD (degenerative disc disease), lumbar   . Depressed   . Difficult intubation    Eschmann stylet used in 2002 and 2007; "trouble waking up afterwards" (01/06/2012)  . Emphysema   . Fatty liver disease, nonalcoholic   . GERD (gastroesophageal reflux disease)   . H/O hiatal hernia   . Headache   . Hypertension   . Hypothyroidism   . Pacemaker   . Pneumonia Aug 2016  . Presence of permanent cardiac pacemaker 9/292013   [employer]  . Sinus pause 01/06/2012   5.2 seconds  . Sleep apnea    "don't wear mask" (01/06/2012)  . Stroke [facility])    pt states that he might have had a stroke not sure  . Type II diabetes mellitus ([place])   . Varicose vein    of esophagus    Past Surgical History:  Procedure Laterality Date  . ATRIAL FLUTTER ABLATION N/A 07/10/2013    Procedure: ATRIAL FLUTTER ABLATION;  Surgeon: Evans Lance, MD;  Location: [facility] CATH LAB;  Service: Cardiovascular;  Laterality: N/A;  . BACK SURGERY    . CHOLECYSTECTOMY  1993  . COLONOSCOPY  11/08/2004   CVE:LFYBOF rectum, colon, TI.  Marland Kitchen COLONOSCOPY N/A 05/28/2014   Dr. Gala Romney: Redundant colon. single colonic polyp removed as described above. Tubular adenoma  . ESOPHAGEAL DILATION N/A 05/28/2014   Procedure: ESOPHAGEAL DILATION;  Surgeon: Daneil Dolin, MD;  [facility];  Service: Endoscopy;  Laterality: N/A;  . ESOPHAGOGASTRODUODENOSCOPY  11/08/2004   BPZ:WCHENI esophageal erosions consistent with erosive reflux esophagitis/Areas of hemorrhage and nodularity of the fundal mucosa of uncertain significance, biopsied.  Small hiatal hernia, otherwise normal stomach  . ESOPHAGOGASTRODUODENOSCOPY  2010   Dr. Gala Romney: 3 columns Grade 1 varices, erosive esophagitis, HH, portal gastropathy, normal D1, D2  . ESOPHAGOGASTRODUODENOSCOPY N/A 05/28/2014   Dr. Gala Romney: MIld erosive reflux esophagitis. Grade 1 esophageal varices. Patent esophagus. No dilation performed. Hiatal hernia.   Marland Kitchen ESOPHAGOGASTRODUODENOSCOPY (EGD) WITH ESOPHAGEAL DILATION N/A 02/14/2013   DPO:EUMPN 1 esophageal varices. Abnormal distal esophagus/status post biopsy after Maloney dilation. Portal gastropathy. Antral erosions-status post biopsy. path negative for H.pylori, benign  path.  . LUMBAR [place]; ~ 1995; ~ 1996  . NASAL SEPTUM SURGERY  1992  . nuclear stress test  10/19/2004   No ischemia  . PERMANENT PACEMAKER INSERTION  01/08/2012   CHB  . PERMANENT PACEMAKER INSERTION N/A 01/09/2012   Procedure: PERMANENT PACEMAKER INSERTION;  Surgeon: Sanda Klein, MD;  Location: [place] CATH LAB;  Service: Cardiovascular;  Laterality: N/A;  . POSTERIOR FUSION LUMBAR SPINE  1999   L4-5  . SPINAL CORD STIMULATOR IMPLANT  2006  . SPINAL CORD STIMULATOR REMOVAL N/A 01/27/2015   Procedure: LUMBAR SPINAL CORD STIMULATOR REMOVAL;   Surgeon: Kristeen Miss, MD;  Location: [place] NEURO ORS;  Service: Neurosurgery;  Laterality: N/A;  LUMBAR SPINAL CORD STIMULATOR REMOVAL  . TONSILLECTOMY AND ADENOIDECTOMY  1992  . US ECHOCARDIOGRAPHY  12/28/2011   mild LVH,mild mitral annulara ca+,mild MR  . Warthin's tumor excision  1990's   right    There were no vitals filed for this visit.        [facility] PT Assessment - 09/14/16 0001      Assessment   Medical Diagnosis Non healing wound    Referring Provider  Junell Koberlein   Onset Date/Surgical Date 07/27/16   Next MD Visit unknown   Prior Therapy self care, antibiotic, stitches, antibiotic ointment      Precautions   Precautions None     Restrictions   Weight Bearing Restrictions No     Balance Screen   Has the patient fallen in the past 6 months No   Has the patient had a decrease in activity level because of a fear of falling?  No   Is the patient reluctant to leave their home because of a fear of falling?  No         Wound Therapy - 09/14/16 1206    Subjective Mr. Klausing states that he cut his leg on the edge of a generator in April.  He went to the ER and had 15 stitches but the wound will not heal.  He states that the pain is as severe as it was the first day that he cut his leg.  He has been on antibiotic and has tried antibiotic cream but nothing seems to help.  He has been referred to skilled therapy.    Patient and Family Stated Goals no pain, wound to heal    Date of Onset 07/25/16   Prior Treatments stiches, antibiotics.    Pain Assessment 0-10   Pain Score --  10+   Pain Type Acute pain   Pain Location Leg   Pain Orientation Left;Distal   Pain Descriptors / Indicators Penetrating;Stabbing   Pain Onset On-going   Patients Stated Pain Goal 0   Pain Intervention(s) --  compression bandage   Multiple Pain Sites No   Evaluation and Treatment Procedures Explained to Patient/Family Yes   Evaluation and Treatment Procedures agreed to   Wound Properties  Date First Assessed: 09/14/16 Time First Assessed: 1024 Wound Type: Laceration Location: Leg Location Orientation: Left Wound Description (Comments): L shape wound  Present on Admission: Yes   Dressing Type None   Dressing Changed New   Dressing Status None   Dressing Change Frequency PRN   Site / Wound Assessment Dry;Dusky;Yellow   % Wound base Red or Granulating 10%   % Wound base Yellow/Fibrinous Exudate 90%   Peri-wound Assessment Edema;Other (Comment)  varicosities    Wound Length (cm) 5.5 cm   Wound Width (cm) 2.8 cm  Wound Depth (cm) 0.3 cm   Drainage Amount None   Treatment Cleansed;Debridement (Selective);Other (Comment)   Selective Debridement - Location wound bed   Selective Debridement - Tools Used Forceps;Scalpel   Selective Debridement - Tissue Removed slough   Wound Therapy - Clinical Statement Mr. Gunning is a 66 yo male who injured his left leg on a generator in April.  Despite medical attention his wound will not heal and his pain remains very high.  He has been referred to skilled physical therapy to address his pain and create an environment that is conductive to wound healing.    Wound Therapy - Functional Problem List decreased activity tolerance due to pain.    Factors Delaying/Impairing Wound Healing Altered sensation;Diabetes Mellitus;Infection - systemic/local;Multiple medical problems;Polypharmacy;Vascular compromise   Hydrotherapy Plan Debridement;Dressing change;Patient/family education;Other (comment)  compression    Wound Therapy - Frequency --  2x a week x 4 weeks    Wound Therapy - Current Recommendations PT   Wound Plan debridement, dressing change and compression dressing    Dressing  medihoney , 4x4 and profore multilayer compression dressing .             Objective measurements completed on examination: See above findings.                PT Education - 09/14/16 1217    Education provided Yes   Education Details Keep dressing  dry; if compression bandaging becomes to tight remove one layer at a time.    Person(s) Educated Patient   Methods Explanation   Comprehension Verbalized understanding          PT Short Term Goals - 09/14/16 1220      PT SHORT TERM GOAL #1   Title Pt wound to be 100% granulated to reduce risk of infection    Time 2   Period Weeks   Status New     PT SHORT TERM GOAL #2   Title Pt pain level to be decreased to no greater than a 5/10 to allow pt to tolerate being up for over 2 hours at a time    Time 2   Period Weeks   Status New     PT SHORT TERM GOAL #3   Title Pt to verbalize the importance of keeping wound moist and covered for healing    Time 2   Period Days   Status New           PT Long Term Goals - 09/14/16 1221      PT LONG TERM GOAL #1   Title Pt wound to be decreased in size to at least 2x.8x .1 to allow pt to feel comfortable in self care    Time 4   Period Weeks   Status New     PT LONG TERM GOAL #2   Title Pt to verbalize the importance of using compression garments to assist in venous bloodflow to decrease risk of wounds,.   Time 4   Period Weeks   Status New              Plan - 09/14/16 1219    Clinical Impression Statement see above    Clinical Presentation Stable   Clinical Decision Making Moderate   Rehab Potential Good   PT Frequency 2x / week   PT Duration 4 weeks   PT Treatment/Interventions --  debridement, compression bandaging    PT Next Visit Plan see above    Consulted and Agree with  Plan of Care Patient      Patient will benefit from skilled therapeutic intervention in order to improve the following deficits and impairments:  Decreased activity tolerance, Pain  Visit Diagnosis: Pain in left leg  Laceration of left leg excluding thigh, with complication, sequela      G-Codes - 10/14/16 1225    Functional Assessment Tool Used (Outpatient Only) Clinical judgement:  granulation    Functional Limitation Other PT  primary   Other PT Primary Current Status (E7517) At least 80 percent but less than 100 percent impaired, limited or restricted   Other PT Primary Goal Status (G0174) At least 1 percent but less than 20 percent impaired, limited or restricted      Problem List Patient Active Problem List   Diagnosis Date Noted  . Hyperlipidemia 04/02/2015  . Essential hypertension, benign 04/02/2015  . Other specified hypothyroidism 04/02/2015  . History of colonic polyps   . Encounter for screening colonoscopy 05/07/2014  . Psoriasis 10/15/2013  . Hypoxemia 09/05/2013  . SVT (supraventricular tachycardia) ([place]) 06/06/2013  . COPD (chronic obstructive pulmonary disease) ([place]) 06/06/2013  . Cardiac pacemaker in situ 06/06/2013  . Pacemaker 05/22/2013  . Cirrhosis ([place]) 01/22/2013  . Dyspepsia 01/22/2013  . Anemia 01/22/2013  . Atrial fibrillation ([place]) 11/21/2012  . Atrial flutter ([place]) 11/21/2012  . Chest pain 11/20/2012  .  Acute on chronic diastolic heart failure, predominantly Right heart failure 01/09/2012  . Syncope 01/06/2012  . Sinus arrest, 5 second pause on event monitor 01/06/2012  . Type 2 diabetes mellitus, uncontrolled ([place]) 01/06/2012  . Obesity 01/06/2012  . Smoker 01/06/2012  . Nocturnal hypoxemia 01/06/2012  . DJD, on disability secondary to back pain, surg X 3 01/06/2012  . ABDOMINAL PAIN, RIGHT UPPER QUADRANT 06/14/2009  . ABDOMINAL PAIN 05/14/2009  . Hepatic cirrhosis ([place]) 01/13/2009  . GERD 12/04/2008  . GASTRITIS, ACUTE 12/04/2008  . HOARSENESS 12/04/2008  . CHEST PAIN 12/04/2008  . Dysphagia, pharyngoesophageal phase 12/04/2008    Rayetta Humphrey, PT CLT [phone] October 14, 2016, 12:26 PM  [facility] [address] [place], [place], [address] Phone: [phone]   Fax:  [phone]  Name: TRAY KLAYMAN MRN: [id number] Date of Birth: 04-06-1951

## 2016-09-16 ENCOUNTER — Ambulatory Visit (HOSPITAL_COMMUNITY): Payer: Medicare HMO | Admitting: Physical Therapy

## 2016-09-16 DIAGNOSIS — S81812S Laceration without foreign body, left lower leg, sequela: Secondary | ICD-10-CM

## 2016-09-16 DIAGNOSIS — M79605 Pain in left leg: Secondary | ICD-10-CM

## 2016-09-16 DIAGNOSIS — Z5189 Encounter for other specified aftercare: Secondary | ICD-10-CM | POA: Diagnosis not present

## 2016-09-16 NOTE — Therapy (Signed)
[place], [place], [address] Phone: [phone]   Fax:  [phone]  Wound Care Therapy  Patient Details  Name: Ralph [name] MRN: [id number] Date of Birth: 11-Mar-1951 Referring Provider:  Micheline Rough  Encounter Date: 09/16/2016      PT End of Session - 09/16/16 1025    Visit Number 2   Number of Visits 8   Date for PT Re-Evaluation 10/14/16   Authorization - Visit Number 2   Authorization - Number of Visits 8   PT Start Time 0950   PT Stop Time 1020   PT Time Calculation (min) 30 min   Activity Tolerance Patient tolerated treatment well   Behavior During Therapy [facility] for tasks assessed/performed      Past Medical History:  Diagnosis Date  . Anginal pain ([place])   . Arthritis    "back; fingers" (01/06/2012)  . Atrial flutter [facility])    s/p EPS +RF ablation of typical atrial flutter April 2015  . Cancer ([place])    skin cancer  . CHB (complete heart block) ([place])   . CHF (congestive heart failure) ([place]) 01/06/2012  . Chronic lower back pain   . 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 Coughing up blood    "comes from my throat" (01/06/2012)  . DDD (degenerative disc disease), lumbar   . Depressed   . Difficult intubation    Eschmann stylet used in 2002 and 2007; "trouble waking up afterwards" (01/06/2012)  . Emphysema   . Fatty liver disease, nonalcoholic   . GERD (gastroesophageal reflux disease)   . H/O hiatal hernia   . Headache   . Hypertension   . Hypothyroidism   . Pacemaker   . Pneumonia Aug 2016  . Presence of permanent cardiac pacemaker 9/292013   [employer]  . Sinus pause 01/06/2012   5.2 seconds  . Sleep apnea    "don't wear mask" (01/06/2012)  . Stroke [facility])    pt states that he might have had a stroke not sure  . Type II diabetes mellitus ([place])   . Varicose vein    of esophagus    Past Surgical History:  Procedure Laterality Date  . ATRIAL FLUTTER ABLATION N/A 07/10/2013   Procedure: ATRIAL FLUTTER ABLATION;  Surgeon: Evans Lance, MD;  Location: [facility] CATH LAB;  Service: Cardiovascular;  Laterality: N/A;  . BACK SURGERY    . CHOLECYSTECTOMY  1993  . COLONOSCOPY  11/08/2004   PJK:DTOIZT rectum, colon, TI.  Marland Kitchen COLONOSCOPY N/A 05/28/2014   Dr. Gala Romney: Redundant colon. single colonic polyp removed as described above. Tubular adenoma  . ESOPHAGEAL DILATION N/A 05/28/2014   Procedure: ESOPHAGEAL DILATION;  Surgeon: Daneil Dolin, MD;  [facility];  Service: Endoscopy;  Laterality: N/A;  . ESOPHAGOGASTRODUODENOSCOPY  11/08/2004   IWP:YKDXIP esophageal erosions consistent with erosive reflux esophagitis/Areas of hemorrhage and nodularity of the fundal mucosa of uncertain significance, biopsied.  Small hiatal hernia, otherwise normal stomach  . ESOPHAGOGASTRODUODENOSCOPY  2010   Dr. Gala Romney: 3 columns Grade 1 varices, erosive esophagitis, HH, portal gastropathy, normal D1, D2  . ESOPHAGOGASTRODUODENOSCOPY N/A 05/28/2014   Dr. Gala Romney: MIld erosive reflux esophagitis. Grade 1 esophageal varices. Patent esophagus. No dilation performed. Hiatal hernia.   Marland Kitchen ESOPHAGOGASTRODUODENOSCOPY (EGD) WITH ESOPHAGEAL DILATION N/A 02/14/2013   JAS:NKNLZ 1 esophageal varices. Abnormal distal esophagus/status post biopsy after Maloney dilation. Portal gastropathy. Antral erosions-status post biopsy. path negative for H.pylori, benign path.  Marland Kitchen  [place] SURGERY  1994; ~ 1995; ~ 1996  . NASAL SEPTUM SURGERY  1992  . nuclear stress test  10/19/2004   No ischemia  . PERMANENT PACEMAKER INSERTION  01/08/2012   CHB  . PERMANENT PACEMAKER INSERTION N/A 01/09/2012   Procedure: PERMANENT PACEMAKER INSERTION;  Surgeon: Sanda Klein, MD;  Location: [place] CATH LAB;  Service: Cardiovascular;  Laterality: N/A;  . POSTERIOR FUSION LUMBAR SPINE  1999   L4-5  . SPINAL CORD STIMULATOR IMPLANT  2006  . SPINAL CORD STIMULATOR REMOVAL N/A 01/27/2015   Procedure: LUMBAR SPINAL CORD STIMULATOR REMOVAL;   Surgeon: Kristeen Miss, MD;  Location: [place] NEURO ORS;  Service: Neurosurgery;  Laterality: N/A;  LUMBAR SPINAL CORD STIMULATOR REMOVAL  . TONSILLECTOMY AND ADENOIDECTOMY  1992  . US ECHOCARDIOGRAPHY  12/28/2011   mild LVH,mild mitral annulara ca+,mild MR  . Warthin's tumor excision  1990's   right    There were no vitals filed for this visit.                  Wound Therapy - 09/16/16 1022    Subjective Ralph [name] states that he cut his leg on the edge of a generator in April.  He went to the ER and had 15 stitches but the wound will not heal.  He states that the pain is as severe as it was the first day that he cut his leg.  He has been on antibiotic and has tried antibiotic cream but nothing seems to help.  He has been referred to skilled therapy.    Patient and Family Stated Goals no pain, wound to heal    Date of Onset 07/25/16   Prior Treatments stiches, antibiotics.    Pain Assessment 0-10   Pain Score 4    Pain Type Acute pain   Pain Location Leg   Pain Orientation Left;Distal   Pain Descriptors / Indicators Burning   Patients Stated Pain Goal 0   Pain Intervention(s) Emotional support   Multiple Pain Sites No   Evaluation and Treatment Procedures Explained to Patient/Family Yes   Evaluation and Treatment Procedures agreed to   Wound Properties Date First Assessed: 09/14/16 Time First Assessed: 1024 Wound Type: Laceration Location: Leg Location Orientation: Left Wound Description (Comments): L shape wound  Present on Admission: Yes   Dressing Type None   Dressing Status None   Dressing Change Frequency PRN   Site / Wound Assessment Dry;Dusky;Yellow   % Wound base Red or Granulating 70%   % Wound base Yellow/Fibrinous Exudate 30%   Peri-wound Assessment Edema;Other (Comment)  varicosities    Drainage Amount None   Treatment Cleansed;Debridement (Selective)   Selective Debridement - Location wound bed   Selective Debridement - Tools Used Forceps;Scalpel    Selective Debridement - Tissue Removed slough   Wound Therapy - Clinical Statement Pt able to tolerate compression bandaging.  Wound has significant improved granulation.  LE and wound remain dry; moisturaized LE following cleansing prior to compression dressing.   Wound Therapy - Functional Problem List decreased activity tolerance due to pain.    Factors Delaying/Impairing Wound Healing Altered sensation;Diabetes Mellitus;Infection - systemic/local;Multiple medical problems;Polypharmacy;Vascular compromise   Hydrotherapy Plan Debridement;Dressing change;Patient/family education;Other (comment)  compression    Wound Therapy - Frequency --  2x a week x 4 weeks    Wound Therapy - Current Recommendations PT   Wound Plan debridement, dressing change and compression dressing    Dressing  medihoney , 4x4 and profore multilayer compression dressing .  PT Short Term Goals - 09/16/16 1026      PT SHORT TERM GOAL #1   Title Pt wound to be 100% granulated to reduce risk of infection    Time 2   Period Weeks   Status On-going     PT SHORT TERM GOAL #2   Title Pt pain level to be decreased to no greater than a 5/10 to allow pt to tolerate being up for over 2 hours at a time    Time 2   Period Weeks   Status Achieved     PT SHORT TERM GOAL #3   Title Pt to verbalize the importance of keeping wound moist and covered for healing    Time 2   Period Days   Status On-going           PT Long Term Goals - 09/16/16 1026      PT LONG TERM GOAL #1   Title Pt wound to be decreased in size to at least 2x.8x .1 to allow pt to feel comfortable in self care    Time 4   Period Weeks   Status On-going     PT LONG TERM GOAL #2   Title Pt to verbalize the importance of using compression garments to assist in venous bloodflow to decrease risk of wounds,.   Time 4   Period Weeks   Status On-going               Plan - 09/16/16 1025    Clinical Impression  Statement see above    Clinical Presentation Stable   Rehab Potential Good   PT Frequency 2x / week   PT Duration 4 weeks   PT Treatment/Interventions --  debridement, compression bandaging    PT Next Visit Plan measure wound next session    Consulted and Agree with Plan of Care Patient      Patient will benefit from skilled therapeutic intervention in order to improve the following deficits and impairments:  Decreased activity tolerance, Pain  Visit Diagnosis: Pain in left leg  Laceration of left leg excluding thigh, with complication, sequela     Problem List Patient Active Problem List   Diagnosis Date Noted  . Hyperlipidemia 04/02/2015  . Essential hypertension, benign 04/02/2015  . Other specified hypothyroidism 04/02/2015  . History of colonic polyps   . Encounter for screening colonoscopy 05/07/2014  . Psoriasis 10/15/2013  . Hypoxemia 09/05/2013  . SVT (supraventricular tachycardia) ([place]) 06/06/2013  . COPD (chronic obstructive pulmonary disease) ([place]) 06/06/2013  . Cardiac pacemaker in situ 06/06/2013  . Pacemaker 05/22/2013  . Cirrhosis ([place]) 01/22/2013  . Dyspepsia 01/22/2013  . Anemia 01/22/2013  . Atrial fibrillation ([place]) 11/21/2012  . Atrial flutter ([employer]) 11/21/2012  . Chest pain 11/20/2012  .  Acute on chronic diastolic heart failure, predominantly Right heart failure 01/09/2012  . Syncope 01/06/2012  . Sinus arrest, 5 second pause on event monitor 01/06/2012  . Type 2 diabetes mellitus, uncontrolled ([place]) 01/06/2012  . Obesity 01/06/2012  . Smoker 01/06/2012  . Nocturnal hypoxemia 01/06/2012  . DJD, on disability secondary to back pain, surg X 3 01/06/2012  . ABDOMINAL PAIN, RIGHT UPPER QUADRANT 06/14/2009  . ABDOMINAL PAIN 05/14/2009  . Hepatic cirrhosis ([place]) 01/13/2009  . GERD 12/04/2008  . GASTRITIS, ACUTE 12/04/2008  . HOARSENESS 12/04/2008  . CHEST PAIN 12/04/2008  . Dysphagia, pharyngoesophageal phase 12/04/2008    Rayetta Humphrey,  PT CLT [phone] 09/16/2016, 10:27 AM  [place] [facility]  [place], [place], [address] Phone: [phone]   Fax:  [phone]  Name: Ralph [name] MRN: [id number] Date of Birth: 1950/08/12

## 2016-09-20 ENCOUNTER — Ambulatory Visit (HOSPITAL_COMMUNITY): Payer: Medicare HMO | Admitting: Physical Therapy

## 2016-09-20 DIAGNOSIS — Z5189 Encounter for other specified aftercare: Secondary | ICD-10-CM | POA: Diagnosis not present

## 2016-09-20 DIAGNOSIS — S81812S Laceration without foreign body, left lower leg, sequela: Secondary | ICD-10-CM

## 2016-09-20 DIAGNOSIS — M79605 Pain in left leg: Secondary | ICD-10-CM

## 2016-09-20 NOTE — Therapy (Signed)
[place], [place], [address] Phone: [phone]   Fax:  [phone]  Wound Care Therapy  Patient Details  Name: Ralph [name] MRN: [id number] Date of Birth: August 07, 1950 Referring Provider:  Micheline Rough  Encounter Date: 09/20/2016      PT End of Session - 09/20/16 1541    Visit Number 3   Number of Visits 8   Date for PT Re-Evaluation 10/14/16   Authorization - Visit Number 3   Authorization - Number of Visits 8   PT Start Time 1125   PT Stop Time 1155   PT Time Calculation (min) 30 min   Activity Tolerance Patient tolerated treatment well   Behavior During Therapy [facility] for tasks assessed/performed      Past Medical History:  Diagnosis Date  . Anginal pain ([place])   . Arthritis    "back; fingers" (01/06/2012)  . Atrial flutter [facility])    s/p EPS +RF ablation of typical atrial flutter April 2015  . Cancer ([place])    skin cancer  . CHB (complete heart block) ([place])   . CHF (congestive heart failure) ([place]) 01/06/2012  . Chronic lower back pain   . 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 Coughing up blood    "comes from my throat" (01/06/2012)  . DDD (degenerative disc disease), lumbar   . Depressed   . Difficult intubation    Eschmann stylet used in 2002 and 2007; "trouble waking up afterwards" (01/06/2012)  . Emphysema   . Fatty liver disease, nonalcoholic   . GERD (gastroesophageal reflux disease)   . H/O hiatal hernia   . Headache   . Hypertension   . Hypothyroidism   . Pacemaker   . Pneumonia Aug 2016  . Presence of permanent cardiac pacemaker 9/292013   [employer]  . Sinus pause 01/06/2012   5.2 seconds  . Sleep apnea    "don't wear mask" (01/06/2012)  . Stroke [facility])    pt states that he might have had a stroke not sure  . Type II diabetes mellitus ([place])   . Varicose vein    of esophagus    Past Surgical History:  Procedure Laterality Date  . ATRIAL FLUTTER ABLATION N/A 07/10/2013   Procedure: ATRIAL FLUTTER ABLATION;  Surgeon: Evans Lance, MD;  Location: [facility] CATH LAB;  Service: Cardiovascular;  Laterality: N/A;  . BACK SURGERY    . CHOLECYSTECTOMY  1993  . COLONOSCOPY  11/08/2004   QQP:[id number] rectum, colon, TI.  Marland Kitchen COLONOSCOPY N/A 05/28/2014   Dr. Gala Romney: Redundant colon. single colonic polyp removed as described above. Tubular adenoma  . ESOPHAGEAL DILATION N/A 05/28/2014   Procedure: ESOPHAGEAL DILATION;  Surgeon: Daneil Dolin, MD;  [facility];  Service: Endoscopy;  Laterality: N/A;  . ESOPHAGOGASTRODUODENOSCOPY  11/08/2004   TOI:ZTIWPY esophageal erosions consistent with erosive reflux esophagitis/Areas of hemorrhage and nodularity of the fundal mucosa of uncertain significance, biopsied.  Small hiatal hernia, otherwise normal stomach  . ESOPHAGOGASTRODUODENOSCOPY  2010   Dr. Gala Romney: 3 columns Grade 1 varices, erosive esophagitis, HH, portal gastropathy, normal D1, D2  . ESOPHAGOGASTRODUODENOSCOPY N/A 05/28/2014   Dr. Gala Romney: MIld erosive reflux esophagitis. Grade 1 esophageal varices. Patent esophagus. No dilation performed. Hiatal hernia.   Marland Kitchen ESOPHAGOGASTRODUODENOSCOPY (EGD) WITH ESOPHAGEAL DILATION N/A 02/14/2013   KDX:IPJAS 1 esophageal varices. Abnormal distal esophagus/status post biopsy after Maloney dilation. Portal gastropathy. Antral erosions-status post biopsy. path negative for H.pylori, benign path.  Marland Kitchen  [place] SURGERY  1994; ~ 1995; ~ 1996  . NASAL SEPTUM SURGERY  1992  . nuclear stress test  10/19/2004   No ischemia  . PERMANENT PACEMAKER INSERTION  01/08/2012   CHB  . PERMANENT PACEMAKER INSERTION N/A 01/09/2012   Procedure: PERMANENT PACEMAKER INSERTION;  Surgeon: Sanda Klein, MD;  Location: [place] CATH LAB;  Service: Cardiovascular;  Laterality: N/A;  . POSTERIOR FUSION LUMBAR SPINE  1999   L4-5  . SPINAL CORD STIMULATOR IMPLANT  2006  . SPINAL CORD STIMULATOR REMOVAL N/A 01/27/2015   Procedure: LUMBAR SPINAL CORD STIMULATOR REMOVAL;   Surgeon: Kristeen Miss, MD;  Location: [place] NEURO ORS;  Service: Neurosurgery;  Laterality: N/A;  LUMBAR SPINAL CORD STIMULATOR REMOVAL  . TONSILLECTOMY AND ADENOIDECTOMY  1992  . US ECHOCARDIOGRAPHY  12/28/2011   mild LVH,mild mitral annulara ca+,mild MR  . Warthin's tumor excision  1990's   right    There were no vitals filed for this visit.                  Wound Therapy - 09/20/16 1535    Subjective today, patient reports he is not having any pain.  History includes that he cut his leg on the edge of a generator in April.  He went to the ER and had 15 stitches but the wound will not heal.    Patient and Family Stated Goals no pain, wound to heal    Date of Onset 07/25/16   Prior Treatments stiches, antibiotics.    Pain Assessment No/denies pain   Evaluation and Treatment Procedures Explained to Patient/Family Yes   Evaluation and Treatment Procedures agreed to   Wound Properties Date First Assessed: 09/14/16 Time First Assessed: 1024 Wound Type: Laceration Location: Leg Location Orientation: Left Wound Description (Comments): L shape wound  Present on Admission: Yes   Dressing Type Compression wrap  profore and medihoney   Dressing Changed Changed   Dressing Status None   Dressing Change Frequency PRN   Site / Wound Assessment Dry;Dusky;Yellow   % Wound base Red or Granulating 90%   % Wound base Yellow/Fibrinous Exudate 10%   Peri-wound Assessment Edema;Other (Comment)  varicosities    Drainage Amount None   Treatment Cleansed;Debridement (Selective)   Selective Debridement - Location wound bed   Selective Debridement - Tools Used Forceps;Scalpel   Selective Debridement - Tissue Removed slough   Wound Therapy - Clinical Statement entire area covered in scab, dry skin and slough.  Able to reveal 90%granulation following with only slough remaining in central aspect of lower wound.  Continued with medihonye gel and profore for compression.  Cleaned and moisturized LE well  prior to rebandaging to prevent itching.    Wound Therapy - Functional Problem List decreased activity tolerance due to pain.    Factors Delaying/Impairing Wound Healing Altered sensation;Diabetes Mellitus;Infection - systemic/local;Multiple medical problems;Polypharmacy;Vascular compromise   Hydrotherapy Plan Debridement;Dressing change;Patient/family education;Other (comment)  compression    Wound Therapy - Frequency --  2x a week x 4 weeks    Wound Therapy - Current Recommendations PT   Wound Plan debridement, dressing change and compression dressing    Dressing  medihoney , 4x4 and profore multilayer compression dressing .                    PT Short Term Goals - 09/16/16 1026      PT SHORT TERM GOAL #1   Title Pt wound to be 100% granulated to reduce risk of infection  Time 2   Period Weeks   Status On-going     PT SHORT TERM GOAL #2   Title Pt pain level to be decreased to no greater than a 5/10 to allow pt to tolerate being up for over 2 hours at a time    Time 2   Period Weeks   Status Achieved     PT SHORT TERM GOAL #3   Title Pt to verbalize the importance of keeping wound moist and covered for healing    Time 2   Period Days   Status On-going           PT Long Term Goals - 09/16/16 1026      PT LONG TERM GOAL #1   Title Pt wound to be decreased in size to at least 2x.8x .1 to allow pt to feel comfortable in self care    Time 4   Period Weeks   Status On-going     PT LONG TERM GOAL #2   Title Pt to verbalize the importance of using compression garments to assist in venous bloodflow to decrease risk of wounds,.   Time 4   Period Weeks   Status On-going             Patient will benefit from skilled therapeutic intervention in order to improve the following deficits and impairments:     Visit Diagnosis: Pain in left leg  Laceration of left leg excluding thigh, with complication, sequela     Problem List Patient Active Problem  List   Diagnosis Date Noted  . Hyperlipidemia 04/02/2015  . Essential hypertension, benign 04/02/2015  . Other specified hypothyroidism 04/02/2015  . History of colonic polyps   . Encounter for screening colonoscopy 05/07/2014  . Psoriasis 10/15/2013  . Hypoxemia 09/05/2013  . SVT (supraventricular tachycardia) ([place]) 06/06/2013  . COPD (chronic obstructive pulmonary disease) ([place]) 06/06/2013  . Cardiac pacemaker in situ 06/06/2013  . Pacemaker 05/22/2013  . Cirrhosis ([place]) 01/22/2013  . Dyspepsia 01/22/2013  . Anemia 01/22/2013  . Atrial fibrillation ([place]) 11/21/2012  . Atrial flutter ([place]) 11/21/2012  . Chest pain 11/20/2012  .  Acute on chronic diastolic heart failure, predominantly Right heart failure 01/09/2012  . Syncope 01/06/2012  . Sinus arrest, 5 second pause on event monitor 01/06/2012  . Type 2 diabetes mellitus, uncontrolled ([place]) 01/06/2012  . Obesity 01/06/2012  . Smoker 01/06/2012  . Nocturnal hypoxemia 01/06/2012  . DJD, on disability secondary to back pain, surg X 3 01/06/2012  . ABDOMINAL PAIN, RIGHT UPPER QUADRANT 06/14/2009  . ABDOMINAL PAIN 05/14/2009  . Hepatic cirrhosis ([place]) 01/13/2009  . GERD 12/04/2008  . GASTRITIS, ACUTE 12/04/2008  . HOARSENESS 12/04/2008  . CHEST PAIN 12/04/2008  . Dysphagia, pharyngoesophageal phase 12/04/2008    Teena Irani, PTA/CLT [phone]  09/20/2016, 3:43 PM  [facility] [address] [place], [place], [address] Phone: [phone]   Fax:  [phone]  Name: Ralph [name] MRN: [id number] Date of Birth: Jun 16, 1950

## 2016-09-22 ENCOUNTER — Other Ambulatory Visit: Payer: Self-pay | Admitting: "Endocrinology

## 2016-09-27 ENCOUNTER — Ambulatory Visit (HOSPITAL_COMMUNITY): Payer: Medicare HMO | Admitting: Physical Therapy

## 2016-09-27 DIAGNOSIS — S81812S Laceration without foreign body, left lower leg, sequela: Secondary | ICD-10-CM

## 2016-09-27 DIAGNOSIS — M79605 Pain in left leg: Secondary | ICD-10-CM

## 2016-09-27 DIAGNOSIS — Z5189 Encounter for other specified aftercare: Secondary | ICD-10-CM | POA: Diagnosis not present

## 2016-09-27 NOTE — Therapy (Signed)
[place], [place], [address] Phone: [phone]   Fax:  [phone]  Wound Care Therapy  Patient Details  Name: Ralph [name] MRN: [id number] Date of Birth: Sep 21, 1950 Referring Provider:  Micheline Rough  Encounter Date: 09/27/2016      PT End of Session - 09/27/16 0854    Visit Number 4   Number of Visits 8   Date for PT Re-Evaluation 10/14/16   Authorization - Visit Number 4   Authorization - Number of Visits 8   PT Start Time 0820   PT Stop Time 0845   PT Time Calculation (min) 25 min   Activity Tolerance Patient tolerated treatment well   Behavior During Therapy [facility] for tasks assessed/performed      Past Medical History:  Diagnosis Date  . Anginal pain ([place])   . Arthritis    "back; fingers" (01/06/2012)  . Atrial flutter [facility])    s/p EPS +RF ablation of typical atrial flutter April 2015  . Cancer ([place])    skin cancer  . CHB (complete heart block) ([place])   . CHF (congestive heart failure) ([place]) 01/06/2012  . Chronic lower back pain   . 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 Coughing up blood    "comes from my throat" (01/06/2012)  . DDD (degenerative disc disease), lumbar   . Depressed   . Difficult intubation    Eschmann stylet used in 2002 and 2007; "trouble waking up afterwards" (01/06/2012)  . Emphysema   . Fatty liver disease, nonalcoholic   . GERD (gastroesophageal reflux disease)   . H/O hiatal hernia   . Headache   . Hypertension   . Hypothyroidism   . Pacemaker   . Pneumonia Aug 2016  . Presence of permanent cardiac pacemaker 9/292013   [employer]  . Sinus pause 01/06/2012   5.2 seconds  . Sleep apnea    "don't wear mask" (01/06/2012)  . Stroke [facility])    pt states that he might have had a stroke not sure  . Type II diabetes mellitus ([place])   . Varicose vein    of esophagus    Past Surgical History:  Procedure Laterality Date  . ATRIAL FLUTTER ABLATION N/A 07/10/2013   Procedure: ATRIAL FLUTTER ABLATION;  Surgeon: Evans Lance, MD;  [facility] CATH LAB;  Service: Cardiovascular;  Laterality: N/A;  . BACK SURGERY    . CHOLECYSTECTOMY  1993  . COLONOSCOPY  11/08/2004   [id number] rectum, colon, TI.  Marland Kitchen COLONOSCOPY N/A 05/28/2014   Dr. Gala Romney: Redundant colon. single colonic polyp removed as described above. Tubular adenoma  . ESOPHAGEAL DILATION N/A 05/28/2014   Procedure: ESOPHAGEAL DILATION;  Surgeon: Daneil Dolin, MD;  [facility];  Service: Endoscopy;  Laterality: N/A;  . ESOPHAGOGASTRODUODENOSCOPY  11/08/2004   VOZ:DGUYQI esophageal erosions consistent with erosive reflux esophagitis/Areas of hemorrhage and nodularity of the fundal mucosa of uncertain significance, biopsied.  Small hiatal hernia, otherwise normal stomach  . ESOPHAGOGASTRODUODENOSCOPY  2010   Dr. Gala Romney: 3 columns Grade 1 varices, erosive esophagitis, HH, portal gastropathy, normal D1, D2  . ESOPHAGOGASTRODUODENOSCOPY N/A 05/28/2014   Dr. Gala Romney: MIld erosive reflux esophagitis. Grade 1 esophageal varices. Patent esophagus. No dilation performed. Hiatal hernia.   Marland Kitchen ESOPHAGOGASTRODUODENOSCOPY (EGD) WITH ESOPHAGEAL DILATION N/A 02/14/2013   HKV:QQVZD 1 esophageal varices. Abnormal distal esophagus/status post biopsy after Maloney dilation. Portal gastropathy. Antral erosions-status post biopsy. path negative for H.pylori, benign path.  Marland Kitchen  [place] SURGERY  1994; ~ 1995; ~ 1996  . NASAL SEPTUM SURGERY  1992  . nuclear stress test  10/19/2004   No ischemia  . PERMANENT PACEMAKER INSERTION  01/08/2012   CHB  . PERMANENT PACEMAKER INSERTION N/A 01/09/2012   Procedure: PERMANENT PACEMAKER INSERTION;  Surgeon: Sanda Klein, MD;  Location: [place] CATH LAB;  Service: Cardiovascular;  Laterality: N/A;  . POSTERIOR FUSION LUMBAR SPINE  1999   L4-5  . SPINAL CORD STIMULATOR IMPLANT  2006  . SPINAL CORD STIMULATOR REMOVAL N/A 01/27/2015   Procedure: LUMBAR SPINAL CORD STIMULATOR REMOVAL;   Surgeon: Kristeen Miss, MD;  Location: [place] NEURO ORS;  Service: Neurosurgery;  Laterality: N/A;  LUMBAR SPINAL CORD STIMULATOR REMOVAL  . TONSILLECTOMY AND ADENOIDECTOMY  1992  . US ECHOCARDIOGRAPHY  12/28/2011   mild LVH,mild mitral annulara ca+,mild MR  . Warthin's tumor excision  1990's   right    There were no vitals filed for this visit.                  Wound Therapy - 09/27/16 0851    Subjective Patient arrives with no complaints other than his leg under proform itching    Patient and Family Stated Goals no pain, wound to heal    Date of Onset 07/25/16   Prior Treatments stiches, antibiotics.    Pain Assessment No/denies pain   Evaluation and Treatment Procedures Explained to Patient/Family Yes   Evaluation and Treatment Procedures agreed to   Wound Properties Date First Assessed: 09/14/16 Time First Assessed: 1024 Wound Type: Laceration Location: Leg Location Orientation: Left Wound Description (Comments): L shape wound  Present on Admission: Yes   Dressing Type Compression wrap   Dressing Changed Changed   Dressing Status None   Dressing Change Frequency PRN   Drainage Amount None   Treatment Cleansed;Packing (Impregnated strip);Other (Comment);Pressure applied  cleansed, xeroform, proform    Wound Therapy - Clinical Statement Patient arrives today stating he is doing well but it is itching under the profore, no issues with compression. Upon removal of profore wrap, wound appears to be doing very well and is actually almost healed at this point with one small open area left; entire area remains completely dry however. Changed dressing to xeroform and continued with profore wrap for compression. Pending wound status next skilled session, patient may be ready for DC soon.    Wound Therapy - Functional Problem List decreased activity tolerance due to pain.    Factors Delaying/Impairing Wound Healing Altered sensation;Diabetes Mellitus;Infection - systemic/local;Multiple  medical problems;Polypharmacy;Vascular compromise   Hydrotherapy Plan Debridement;Dressing change;Patient/family education;Other (comment)   Wound Therapy - Frequency Other (comment)  2x/week for 4 weeks    Wound Therapy - Current Recommendations PT   Wound Plan continue xeroform and proform; depending on wound status patient may be ready for DC next session    Dressing  xeroform, proform                  PT Education - 09/27/16 0853    Education provided Yes   Education Details wound status, possible DC within next couple of sessions pending wound status, monitor compression/remove one layer at a time if it becomes too tight, impact of smoking on wound healing     Person(s) Educated Patient   Methods Explanation   Comprehension Verbalized understanding          PT Short Term Goals - 09/16/16 1026      PT SHORT TERM GOAL #1  Title Pt wound to be 100% granulated to reduce risk of infection    Time 2   Period Weeks   Status On-going     PT SHORT TERM GOAL #2   Title Pt pain level to be decreased to no greater than a 5/10 to allow pt to tolerate being up for over 2 hours at a time    Time 2   Period Weeks   Status Achieved     PT SHORT TERM GOAL #3   Title Pt to verbalize the importance of keeping wound moist and covered for healing    Time 2   Period Days   Status On-going           PT Long Term Goals - 09/16/16 1026      PT LONG TERM GOAL #1   Title Pt wound to be decreased in size to at least 2x.8x .1 to allow pt to feel comfortable in self care    Time 4   Period Weeks   Status On-going     PT LONG TERM GOAL #2   Title Pt to verbalize the importance of using compression garments to assist in venous bloodflow to decrease risk of wounds,.   Time 4   Period Weeks   Status On-going             Patient will benefit from skilled therapeutic intervention in order to improve the following deficits and impairments:     Visit Diagnosis: Pain in  left leg  Laceration of left leg excluding thigh, with complication, sequela     Problem List Patient Active Problem List   Diagnosis Date Noted  . Hyperlipidemia 04/02/2015  . Essential hypertension, benign 04/02/2015  . Other specified hypothyroidism 04/02/2015  . History of colonic polyps   . Encounter for screening colonoscopy 05/07/2014  . Psoriasis 10/15/2013  . Hypoxemia 09/05/2013  . SVT (supraventricular tachycardia) ([place]) 06/06/2013  . COPD (chronic obstructive pulmonary disease) ([place]) 06/06/2013  . Cardiac pacemaker in situ 06/06/2013  . Pacemaker 05/22/2013  . Cirrhosis ([place]) 01/22/2013  . Dyspepsia 01/22/2013  . Anemia 01/22/2013  . Atrial fibrillation ([place]) 11/21/2012  . Atrial flutter ([place]) 11/21/2012  . Chest pain 11/20/2012  .  Acute on chronic diastolic heart failure, predominantly Right heart failure 01/09/2012  . Syncope 01/06/2012  . Sinus arrest, 5 second pause on event monitor 01/06/2012  . Type 2 diabetes mellitus, uncontrolled ([place]) 01/06/2012  . Obesity 01/06/2012  . Smoker 01/06/2012  . Nocturnal hypoxemia 01/06/2012  . DJD, on disability secondary to back pain, surg X 3 01/06/2012  . ABDOMINAL PAIN, RIGHT UPPER QUADRANT 06/14/2009  . ABDOMINAL PAIN 05/14/2009  . Hepatic cirrhosis ([place]) 01/13/2009  . GERD 12/04/2008  . GASTRITIS, ACUTE 12/04/2008  . HOARSENESS 12/04/2008  . CHEST PAIN 12/04/2008  . Dysphagia, pharyngoesophageal phase 12/04/2008    Deniece Ree PT, DPT [facility] [address] [place], [place], [address] Phone: [phone]   Fax:  [phone]  Name: NIMA KEMPPAINEN MRN: [id number] Date of Birth: Jan 03, 1951

## 2016-10-03 ENCOUNTER — Ambulatory Visit (HOSPITAL_COMMUNITY): Payer: Medicare HMO | Admitting: Physical Therapy

## 2016-10-03 DIAGNOSIS — Z5189 Encounter for other specified aftercare: Secondary | ICD-10-CM | POA: Diagnosis not present

## 2016-10-03 DIAGNOSIS — S81812S Laceration without foreign body, left lower leg, sequela: Secondary | ICD-10-CM

## 2016-10-03 DIAGNOSIS — M79605 Pain in left leg: Secondary | ICD-10-CM

## 2016-10-03 NOTE — Therapy (Signed)
[facility] [address] [place], [place], [address] Phone: [phone]   Fax:  [phone]  Wound Care Therapy (Discharge)  Patient Details  Name: DEVANTA DANIEL MRN: [id number] Date of Birth: May 21, 1950 Referring Provider:  Micheline Rough  Encounter Date: 10/03/2016      PT End of Session - 10/03/16 1156    Visit Number 5   Number of Visits 5   Authorization - Visit Number 5   Authorization - Number of Visits 8   PT Start Time 0254   PT Stop Time 1145   PT Time Calculation (min) 28 min   Activity Tolerance Patient tolerated treatment well   Behavior During Therapy [facility] for tasks assessed/performed      Past Medical History:  Diagnosis Date  . Anginal pain ([place])   . Arthritis    "back; fingers" (01/06/2012)  . Atrial flutter [facility])    s/p EPS +RF ablation of typical atrial flutter April 2015  . Cancer ([place])    skin cancer  . CHB (complete heart block) ([place])   . CHF (congestive heart failure) ([place]) 01/06/2012  . Chronic lower back pain   . 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 Coughing up blood    "comes from my throat" (01/06/2012)  . DDD (degenerative disc disease), lumbar   . Depressed   . Difficult intubation    Eschmann stylet used in 2002 and 2007; "trouble waking up afterwards" (01/06/2012)  . Emphysema   . Fatty liver disease, nonalcoholic   . GERD (gastroesophageal reflux disease)   . H/O hiatal hernia   . Headache   . Hypertension   . Hypothyroidism   . Pacemaker   . Pneumonia Aug 2016  . Presence of permanent cardiac pacemaker 9/292013   [employer]  . Sinus pause 01/06/2012   5.2 seconds  . Sleep apnea    "don't wear mask" (01/06/2012)  . Stroke [facility])    pt states that he might have had a stroke not sure  . Type II diabetes mellitus ([place])   . Varicose vein    of esophagus    Past Surgical History:  Procedure Laterality Date  . ATRIAL FLUTTER ABLATION N/A 07/10/2013   Procedure: ATRIAL FLUTTER  ABLATION;  Surgeon: Evans Lance, MD;  Location: [facility] CATH LAB;  Service: Cardiovascular;  Laterality: N/A;  . BACK SURGERY    . CHOLECYSTECTOMY  1993  . COLONOSCOPY  11/08/2004   YHC:WCBJSE rectum, colon, TI.  Marland Kitchen COLONOSCOPY N/A 05/28/2014   Dr. Gala Romney: Redundant colon. single colonic polyp removed as described above. Tubular adenoma  . ESOPHAGEAL DILATION N/A 05/28/2014   Procedure: ESOPHAGEAL DILATION;  Surgeon: Daneil Dolin, MD;  [facility];  Service: Endoscopy;  Laterality: N/A;  . ESOPHAGOGASTRODUODENOSCOPY  11/08/2004   GBT:DVVOHY esophageal erosions consistent with erosive reflux esophagitis/Areas of hemorrhage and nodularity of the fundal mucosa of uncertain significance, biopsied.  Small hiatal hernia, otherwise normal stomach  . ESOPHAGOGASTRODUODENOSCOPY  2010   Dr. Gala Romney: 3 columns Grade 1 varices, erosive esophagitis, HH, portal gastropathy, normal D1, D2  . ESOPHAGOGASTRODUODENOSCOPY N/A 05/28/2014   Dr. Gala Romney: MIld erosive reflux esophagitis. Grade 1 esophageal varices. Patent esophagus. No dilation performed. Hiatal hernia.   Marland Kitchen ESOPHAGOGASTRODUODENOSCOPY (EGD) WITH ESOPHAGEAL DILATION N/A 02/14/2013   WVP:XTGGY 1 esophageal varices. Abnormal distal esophagus/status post biopsy after Maloney dilation. Portal gastropathy. Antral erosions-status post biopsy. path negative for H.pylori, benign path.  . LUMBAR [place] SURGERY  1994; ~ 1995; ~ 1996  . NASAL SEPTUM SURGERY  1992  . nuclear stress test  10/19/2004   No ischemia  . PERMANENT PACEMAKER INSERTION  01/08/2012   CHB  . PERMANENT PACEMAKER INSERTION N/A 01/09/2012   Procedure: PERMANENT PACEMAKER INSERTION;  Surgeon: Sanda Klein, MD;  Location: [facility] CATH LAB;  Service: Cardiovascular;  Laterality: N/A;  . POSTERIOR FUSION LUMBAR SPINE  1999   L4-5  . SPINAL CORD STIMULATOR IMPLANT  2006  . SPINAL CORD STIMULATOR REMOVAL N/A 01/27/2015   Procedure: LUMBAR SPINAL CORD STIMULATOR REMOVAL;  Surgeon: Kristeen Miss, MD;   Location: [facility] NEURO ORS;  Service: Neurosurgery;  Laterality: N/A;  LUMBAR SPINAL CORD STIMULATOR REMOVAL  . TONSILLECTOMY AND ADENOIDECTOMY  1992  . US ECHOCARDIOGRAPHY  12/28/2011   mild LVH,mild mitral annulara ca+,mild MR  . Warthin's tumor excision  1990's   right    There were no vitals filed for this visit.                  Wound Therapy - 10/03/16 1153    Subjective Patient arrives stating he is diong well but he has had some numbness in his toes adn intermittnet foot pain, did not cut any layers of his wrap off as he thought this was normal    Patient and Family Stated Goals no pain, wound to heal    Date of Onset 07/25/16   Prior Treatments stiches, antibiotics.    Pain Assessment No/denies pain   Evaluation and Treatment Procedures Explained to Patient/Family Yes   Evaluation and Treatment Procedures agreed to   Wound Properties Date First Assessed: 09/14/16 Time First Assessed: 1024 Wound Type: Laceration Location: Leg Location Orientation: Left Wound Description (Comments): L shape wound  Present on Admission: Yes   Dressing Type Compression wrap   Dressing Changed Changed   Dressing Status None   Dressing Change Frequency PRN   Drainage Amount None   Treatment Cleansed;Debridement (Selective);Packing (Impregnated strip);Other (Comment)  debridement dry skin/old scabs; xeroform, gauze, medipore    Selective Debridement - Location general area of wound    Selective Debridement - Tools Used Forceps;Scalpel   Selective Debridement - Tissue Removed dry skin, parts of remaining scab    Wound Therapy - Clinical Statement Patient arrives stating he has had some numbness, occasional pain, in his foot with the profore wrap; removed wrap after checking capillary refill, which seemed symmetrical, however did educate patient on the importance of removing a layer with these symptoms. Wound appears for the most part healed, with debriding revealing that most of the area is  scar tissue hidden under dry skin and scabbing; there is one minor area of distal wound that appears to be still healing but it is covered by scab and should heal nicely with proper care. Educated patient regarding self-care and dressing strategies for wound including not getting in the water until entire area is scar tissue. DC today as no further skilled wound care services are needed at this time.    Wound Therapy - Functional Problem List decreased activity tolerance due to pain.    Factors Delaying/Impairing Wound Healing Altered sensation;Diabetes Mellitus;Infection - systemic/local;Multiple medical problems;Polypharmacy;Vascular compromise   Hydrotherapy Plan Other (comment)  DC today    Wound Therapy - Frequency Other (comment)  DC today    Wound Therapy - Current Recommendations Other (comment)  Dc today   Wound Plan DC today  PT Education - 10-24-2016 1155    Education provided Yes   Education Details general wound status and DC plans for today; self-care of wound including xeroform, gauze, and avoiding getting area wet. DC this session.    Person(s) Educated Patient   Methods Explanation   Comprehension Verbalized understanding          PT Short Term Goals - 24-Oct-2016 1156      PT SHORT TERM GOAL #1   Title Pt wound to be 100% granulated to reduce risk of infection    Time 2   Period Weeks   Status Achieved     PT SHORT TERM GOAL #2   Title Pt pain level to be decreased to no greater than a 5/10 to allow pt to tolerate being up for over 2 hours at a time    Time 2   Period Weeks   Status Achieved     PT SHORT TERM GOAL #3   Title Pt to verbalize the importance of keeping wound moist and covered for healing    Time 2   Period Days   Status Achieved           PT Long Term Goals - 10/24/2016 1156      PT LONG TERM GOAL #1   Title Pt wound to be decreased in size to at least 2x.8x .1 to allow pt to feel comfortable in self care    Time 4    Period Weeks   Status Achieved     PT LONG TERM GOAL #2   Title Pt to verbalize the importance of using compression garments to assist in venous bloodflow to decrease risk of wounds,.   Time 4   Period Weeks   Status Achieved             Patient will benefit from skilled therapeutic intervention in order to improve the following deficits and impairments:     Visit Diagnosis: Pain in left leg  Laceration of left leg excluding thigh, with complication, sequela      G-Codes - 2016-10-24 1157    Functional Assessment Tool Used (Outpatient Only) Clinical judgement: wound healing status/progression   Functional Limitation Other PT primary   Other PT Primary Goal Status (X3244) At least 1 percent but less than 20 percent impaired, limited or restricted   Other PT Primary Discharge Status (W1027) At least 1 percent but less than 20 percent impaired, limited or restricted       Problem List Patient Active Problem List   Diagnosis Date Noted  . Hyperlipidemia 04/02/2015  . Essential hypertension, benign 04/02/2015  . Other specified hypothyroidism 04/02/2015  . History of colonic polyps   . Encounter for screening colonoscopy 05/07/2014  . Psoriasis 10/15/2013  . Hypoxemia 09/05/2013  . SVT (supraventricular tachycardia) ([place]) 06/06/2013  . COPD (chronic obstructive pulmonary disease) ([place]) 06/06/2013  . Cardiac pacemaker in situ 06/06/2013  . Pacemaker 05/22/2013  . Cirrhosis ([place]) 01/22/2013  . Dyspepsia 01/22/2013  . Anemia 01/22/2013  . Atrial fibrillation ([place]) 11/21/2012  . Atrial flutter ([place]) 11/21/2012  . Chest pain 11/20/2012  .  Acute on chronic diastolic heart failure, predominantly Right heart failure 01/09/2012  . Syncope 01/06/2012  . Sinus arrest, 5 second pause on event monitor 01/06/2012  . Type 2 diabetes mellitus, uncontrolled ([place]) 01/06/2012  . Obesity 01/06/2012  . Smoker 01/06/2012  . Nocturnal hypoxemia 01/06/2012  . DJD, on disability  secondary to back pain, surg X 3 01/06/2012  .  ABDOMINAL PAIN, RIGHT UPPER QUADRANT 06/14/2009  . ABDOMINAL PAIN 05/14/2009  . Hepatic cirrhosis ([place]) 01/13/2009  . GERD 12/04/2008  . GASTRITIS, ACUTE 12/04/2008  . HOARSENESS 12/04/2008  . CHEST PAIN 12/04/2008  . Dysphagia, pharyngoesophageal phase 12/04/2008    PHYSICAL THERAPY DISCHARGE SUMMARY  Visits from Start of Care: 5  Current functional level related to goals / functional outcomes: Wound is mostly healed with only a small, scabbed area left; patient no longer needs skilled wound care services and should be able to care for this area on his own at this point. DC today.    Remaining deficits: Small, still healing scabbed area distal wound; dry skin    Education / Equipment: general wound status and DC plans for today; self-care of wound including xeroform, gauze, and avoiding getting area wet. DC this session.  Plan: Patient agrees to discharge.  Patient goals were met. Patient is being discharged due to meeting the stated rehab goals.  ?????       Deniece Ree PT, DPT [facility] [address] [place], [place], [address] Phone: [phone]   Fax:  [phone]  Name: LENA FIELDHOUSE MRN: [id number] Date of Birth: March 27, 1951

## 2016-10-13 DIAGNOSIS — I10 Essential (primary) hypertension: Secondary | ICD-10-CM | POA: Diagnosis not present

## 2016-10-13 DIAGNOSIS — E1165 Type 2 diabetes mellitus with hyperglycemia: Secondary | ICD-10-CM | POA: Diagnosis not present

## 2016-10-13 DIAGNOSIS — I5031 Acute diastolic (congestive) heart failure: Secondary | ICD-10-CM | POA: Diagnosis not present

## 2016-10-13 DIAGNOSIS — L089 Local infection of the skin and subcutaneous tissue, unspecified: Secondary | ICD-10-CM | POA: Diagnosis not present

## 2016-10-13 DIAGNOSIS — E6609 Other obesity due to excess calories: Secondary | ICD-10-CM | POA: Diagnosis not present

## 2016-10-13 DIAGNOSIS — Z6833 Body mass index (BMI) 33.0-33.9, adult: Secondary | ICD-10-CM | POA: Diagnosis not present

## 2016-10-13 DIAGNOSIS — E782 Mixed hyperlipidemia: Secondary | ICD-10-CM | POA: Diagnosis not present

## 2016-10-13 DIAGNOSIS — T1490XD Injury, unspecified, subsequent encounter: Secondary | ICD-10-CM | POA: Diagnosis not present

## 2016-10-18 ENCOUNTER — Ambulatory Visit (HOSPITAL_COMMUNITY): Payer: Medicare HMO | Attending: Family Medicine | Admitting: Physical Therapy

## 2016-10-18 DIAGNOSIS — S81812S Laceration without foreign body, left lower leg, sequela: Secondary | ICD-10-CM | POA: Diagnosis present

## 2016-10-18 DIAGNOSIS — M79605 Pain in left leg: Secondary | ICD-10-CM | POA: Diagnosis not present

## 2016-10-18 NOTE — Therapy (Signed)
[name], [place], [address] Phone: [phone]   Fax:  [phone]  Wound Care Evaluation  Patient Details  Name: [place] [name] MRN: [id number] Date of Birth: 05-03-1950 Referring Provider: Nicholes Mango  Encounter Date: 10/18/2016      PT End of Session - 10/18/16 1435    Visit Number 1   Number of Visits 4   Date for PT Re-Evaluation 11/01/16   Authorization Type medicare   Authorization - Visit Number 1   Authorization - Number of Visits 4   PT Start Time 1884   PT Stop Time 1415   PT Time Calculation (min) 30 min   Activity Tolerance Patient tolerated treatment well   Behavior During Therapy [facility] for tasks assessed/performed      Past Medical History:  Diagnosis Date  . Anginal pain ([place])   . Arthritis    "back; fingers" (01/06/2012)  . Atrial flutter [facility])    s/p EPS +RF ablation of typical atrial flutter April 2015  . Cancer ([place])    skin cancer  . CHB (complete heart block) ([place])   . CHF (congestive heart failure) ([place]) 01/06/2012  . Chronic lower back pain   . 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 Coughing up blood    "comes from my throat" (01/06/2012)  . DDD (degenerative disc disease), lumbar   . Depressed   . Difficult intubation    Eschmann stylet used in 2002 and 2007; "trouble waking up afterwards" (01/06/2012)  . Emphysema   . Fatty liver disease, nonalcoholic   . GERD (gastroesophageal reflux disease)   . H/O hiatal hernia   . Headache   . Hypertension   . Hypothyroidism   . Pacemaker   . Pneumonia Aug 2016  . Presence of permanent cardiac pacemaker 9/292013   [employer]  . Sinus pause 01/06/2012   5.2 seconds  . Sleep apnea    "don't wear mask" (01/06/2012)  . Stroke [facility])    pt states that he might have had a stroke not sure  . Type II diabetes mellitus ([place])   . Varicose vein    of esophagus    Past Surgical History:  Procedure Laterality Date  . ATRIAL  FLUTTER ABLATION N/A 07/10/2013   Procedure: ATRIAL FLUTTER ABLATION;  Surgeon: Evans Lance, MD;  Location: [facility] CATH LAB;  Service: Cardiovascular;  Laterality: N/A;  . BACK SURGERY    . CHOLECYSTECTOMY  1993  . COLONOSCOPY  11/08/2004   ZYS:AYTKZS rectum, colon, TI.  Marland Kitchen COLONOSCOPY N/A 05/28/2014   Dr. Gala Romney: Redundant colon. single colonic polyp removed as described above. Tubular adenoma  . ESOPHAGEAL DILATION N/A 05/28/2014   Procedure: ESOPHAGEAL DILATION;  Surgeon: Daneil Dolin, MD;  [facility];  Service: Endoscopy;  Laterality: N/A;  . ESOPHAGOGASTRODUODENOSCOPY  11/08/2004   WFU:XNATFT esophageal erosions consistent with erosive reflux esophagitis/Areas of hemorrhage and nodularity of the fundal mucosa of uncertain significance, biopsied.  Small hiatal hernia, otherwise normal stomach  . ESOPHAGOGASTRODUODENOSCOPY  2010   Dr. Gala Romney: 3 columns Grade 1 varices, erosive esophagitis, HH, portal gastropathy, normal D1, D2  . ESOPHAGOGASTRODUODENOSCOPY N/A 05/28/2014   Dr. Gala Romney: MIld erosive reflux esophagitis. Grade 1 esophageal varices. Patent esophagus. No dilation performed. Hiatal hernia.   Marland Kitchen ESOPHAGOGASTRODUODENOSCOPY (EGD) WITH ESOPHAGEAL DILATION N/A 02/14/2013   DDU:KGURK 1 esophageal varices. Abnormal distal esophagus/status post biopsy after Maloney dilation. Portal gastropathy. Antral erosions-status post biopsy. path  negative for H.pylori, benign path.  . LUMBAR [place]; ~ 1995; ~ 1996  . NASAL SEPTUM SURGERY  1992  . nuclear stress test  10/19/2004   No ischemia  . PERMANENT PACEMAKER INSERTION  01/08/2012   CHB  . PERMANENT PACEMAKER INSERTION N/A 01/09/2012   Procedure: PERMANENT PACEMAKER INSERTION;  Surgeon: Sanda Klein, MD;  Location: [facility] CATH LAB;  Service: Cardiovascular;  Laterality: N/A;  . POSTERIOR FUSION LUMBAR SPINE  1999   L4-5  . SPINAL CORD STIMULATOR IMPLANT  2006  . SPINAL CORD STIMULATOR REMOVAL N/A 01/27/2015   Procedure: LUMBAR  SPINAL CORD STIMULATOR REMOVAL;  Surgeon: Kristeen Miss, MD;  Location: [facility] NEURO ORS;  Service: Neurosurgery;  Laterality: N/A;  LUMBAR SPINAL CORD STIMULATOR REMOVAL  . TONSILLECTOMY AND ADENOIDECTOMY  1992  . US ECHOCARDIOGRAPHY  12/28/2011   mild LVH,mild mitral annulara ca+,mild MR  . Warthin's tumor excision  1990's   right    There were no vitals filed for this visit.        [facility] PT Assessment - 10/18/16 0001      Assessment   Medical Diagnosis Non healing wound    Referring Provider Lawerence Fusco   Onset Date/Surgical Date 07/27/16   Next MD Visit unknown   Prior Therapy self care, antibiotic, stitches, antibiotic ointment      Precautions   Precautions None     Restrictions   Weight Bearing Restrictions No     Balance Screen   Has the patient fallen in the past 6 months No   Has the patient had a decrease in activity level because of a fear of falling?  No   Is the patient reluctant to leave their home because of a fear of falling?  No         Wound Therapy - 10/18/16 1420    Subjective [name] states that he cut his leg on the edge of a generator in April.  He went to the ER and had 15 stitches but the wound will not heal.  He stHe was placed on antibiotic and was referred to this center .   He was seen for four treatments and discharged on 10/03/2016.  A week after discharged the wound reopened therefor he has been referred back to this facility for woud treatment.   Patient and Family Stated Goals no pain, wound to heal    Date of Onset 07/25/16   Prior Treatments stiches, antibiotics. wound care with debridement and compression dressing.    Pain Assessment 0-10   Pain Score 2    Pain Type Acute pain   Pain Location Leg   Pain Orientation Left;Distal   Pain Descriptors / Indicators Burning   Patients Stated Pain Goal 0   Pain Intervention(s) Emotional support   Multiple Pain Sites No   Evaluation and Treatment Procedures Explained to Patient/Family Yes    Evaluation and Treatment Procedures agreed to   Wound Properties Date First Assessed: 09/14/16 Time First Assessed: 1024 Wound Type: Laceration Location: Leg Location Orientation: Left Wound Description (Comments): 3 seperate wounds the largest is 1.2 x .4x .2 Present on Admission: Yes   Dressing Type None   Dressing Status None   Dressing Change Frequency PRN   Site / Wound Assessment Dry;Granulation tissue;Painful   % Wound base Red or Granulating 90%   % Wound base Yellow/Fibrinous Exudate 10%   Peri-wound Assessment Edema;Other (Comment)  varicosities    Drainage Amount None   Treatment Cleansed;Debridement (  Selective)   Selective Debridement - Location wound bed   Selective Debridement - Tools Used Forceps;Scalpel   Selective Debridement - Tissue Removed slough   Wound Therapy - Clinical Statement Mr. Bossi has a chronic wound that occured when he cut his leg on a generator.  The wound was almost healed and he was discharged from skilled physical therapy on 10/03/2016.  Due to the fact that he has chronic varicosities with swelling of his LE once the profore compression bandages were stoppen his incision began to reopen therefore he went to his MD who has referred him back to therapy.  Mr. Procter was educated on the importance of wearing compression garment on his LE following discharge to prevent this from occuring again.  The therapist measured his LE to allow him to obtain compression garments.     Wound Therapy - Functional Problem List decreased activity tolerance due to pain.    Factors Delaying/Impairing Wound Healing Altered sensation;Diabetes Mellitus;Infection - systemic/local;Multiple medical problems;Polypharmacy;Vascular compromise   Hydrotherapy Plan Debridement;Dressing change;Patient/family education;Other (comment)  compression    Wound Therapy - Frequency --  2x a week x 2 weeks    Wound Therapy - Current Recommendations PT   Wound Plan debridement, dressing change  and compression dressing    Dressing  xeroform followed by profore lite compression bandaging system.             Objective measurements completed on examination: See above findings.                PT Education - 10-26-2016 1434    Education provided Yes   Education Details The importance of wearing compression garments to control his edema    Person(s) Educated Patient   Methods Explanation;Handout   Comprehension Verbalized understanding          PT Short Term Goals - 2016/10/26 1436      PT SHORT TERM GOAL #1   Title Pt wound to be 100% healed    Time 2   Period Weeks   Status New     PT SHORT TERM GOAL #2   Title Pt pain level to be decreased to no greater than a 0/10 to allow pt to tolerate being up for over 2 hours at a time    Time 2   Period Weeks   Status New     PT SHORT TERM GOAL #3   Title Pt to verbalize the importance of wearing compression garments; to have obtained and be able to don compression garment   Time 2   Period Days   Status New                   Plan - 2016-10-26 1436    Clinical Impression Statement see above    Clinical Presentation Stable   Rehab Potential Good   PT Frequency 2x / week   PT Duration 2 weeks   PT Treatment/Interventions --  debridement, compression bandaging    PT Next Visit Plan see above    Consulted and Agree with Plan of Care Patient      Patient will benefit from skilled therapeutic intervention in order to improve the following deficits and impairments:  Decreased activity tolerance, Pain  Visit Diagnosis: Pain in left leg  Laceration of left leg excluding thigh, with complication, sequela      G-Codes - 2016-10-26 1438    Functional Assessment Tool Used (Outpatient Only) clinical judgement; wound size, edema    Functional Limitation  Other PT primary   Other PT Primary Current Status [phone]) At least 20 percent but less than 40 percent impaired, limited or restricted   Other PT  Primary Goal Status (O1751) At least 1 percent but less than 20 percent impaired, limited or restricted      Problem List Patient Active Problem List   Diagnosis Date Noted  . Hyperlipidemia 04/02/2015  . Essential hypertension, benign 04/02/2015  . Other specified hypothyroidism 04/02/2015  . History of colonic polyps   . Encounter for screening colonoscopy 05/07/2014  . Psoriasis 10/15/2013  . Hypoxemia 09/05/2013  . SVT (supraventricular tachycardia) ([place]) 06/06/2013  . COPD (chronic obstructive pulmonary disease) ([place]) 06/06/2013  . Cardiac pacemaker in situ 06/06/2013  . Pacemaker 05/22/2013  . Cirrhosis ([place]) 01/22/2013  . Dyspepsia 01/22/2013  . Anemia 01/22/2013  . Atrial fibrillation ([place]) 11/21/2012  . Atrial flutter ([place]) 11/21/2012  . Chest pain 11/20/2012  .  Acute on chronic diastolic heart failure, predominantly Right heart failure 01/09/2012  . Syncope 01/06/2012  . Sinus arrest, 5 second pause on event monitor 01/06/2012  . Type 2 diabetes mellitus, uncontrolled ([place]) 01/06/2012  . Obesity 01/06/2012  . Smoker 01/06/2012  . Nocturnal hypoxemia 01/06/2012  . DJD, on disability secondary to back pain, surg X 3 01/06/2012  . ABDOMINAL PAIN, RIGHT UPPER QUADRANT 06/14/2009  . ABDOMINAL PAIN 05/14/2009  . Hepatic cirrhosis ([place]) 01/13/2009  . GERD 12/04/2008  . GASTRITIS, ACUTE 12/04/2008  . HOARSENESS 12/04/2008  . CHEST PAIN 12/04/2008  . Dysphagia, pharyngoesophageal phase 12/04/2008  Rayetta Humphrey, PT CLT [phone] 10/18/2016, 2:39 PM  [facility] [address] [place], [place], [address] Phone: [phone]   Fax:  [phone]  Name: CATRELL MORRONE MRN: [id number] Date of Birth: Aug 27, 1950

## 2016-10-21 ENCOUNTER — Other Ambulatory Visit: Payer: Self-pay | Admitting: "Endocrinology

## 2016-10-21 ENCOUNTER — Ambulatory Visit (HOSPITAL_COMMUNITY): Payer: Medicare HMO

## 2016-10-21 DIAGNOSIS — M79605 Pain in left leg: Secondary | ICD-10-CM

## 2016-10-21 DIAGNOSIS — S81812S Laceration without foreign body, left lower leg, sequela: Secondary | ICD-10-CM

## 2016-10-21 NOTE — Therapy (Signed)
[place], [place], [address] Phone: [phone]   Fax:  [phone]  Wound Care Therapy  Patient Details  Name: Ralph [name] MRN: [id number] Date of Birth: Feb 27, 1951 Referring Provider: Nicholes Mango  Encounter Date: 10/21/2016      PT End of Session - 10/21/16 1747    Visit Number 2   Number of Visits 4   Date for PT Re-Evaluation 11/01/16   Authorization Type medicare   Authorization - Visit Number 2   Authorization - Number of Visits 4   PT Start Time 4627   PT Stop Time 0350   PT Time Calculation (min) 27 min   Activity Tolerance Patient tolerated treatment well;No increased pain   Behavior During Therapy WFL for tasks assessed/performed      Past Medical History:  Diagnosis Date  . Anginal pain ([place])   . Arthritis    "back; fingers" (01/06/2012)  . Atrial flutter [facility])    s/p EPS +RF ablation of typical atrial flutter April 2015  . Cancer ([place])    skin cancer  . CHB (complete heart block) ([place])   . CHF (congestive heart failure) ([place]) 01/06/2012  . Chronic lower back pain   . 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 Coughing up blood    "comes from my throat" (01/06/2012)  . DDD (degenerative disc disease), lumbar   . Depressed   . Difficult intubation    Eschmann stylet used in 2002 and 2007; "trouble waking up afterwards" (01/06/2012)  . Emphysema   . Fatty liver disease, nonalcoholic   . GERD (gastroesophageal reflux disease)   . H/O hiatal hernia   . Headache   . Hypertension   . Hypothyroidism   . Pacemaker   . Pneumonia Aug 2016  . Presence of permanent cardiac pacemaker 9/292013   [employer]  . Sinus pause 01/06/2012   5.2 seconds  . Sleep apnea    "don't wear mask" (01/06/2012)  . Stroke [facility])    pt states that he might have had a stroke not sure  . Type II diabetes mellitus ([place])   . Varicose vein    of esophagus    Past Surgical History:  Procedure Laterality  Date  . ATRIAL FLUTTER ABLATION N/A 07/10/2013   Procedure: ATRIAL FLUTTER ABLATION;  Surgeon: Evans Lance, MD;  Location: [facility] CATH LAB;  Service: Cardiovascular;  Laterality: N/A;  . BACK SURGERY    . CHOLECYSTECTOMY  1993  . COLONOSCOPY  11/08/2004   KXF:GHWEXH rectum, colon, TI.  Marland Kitchen COLONOSCOPY N/A 05/28/2014   Dr. Gala Romney: Redundant colon. single colonic polyp removed as described above. Tubular adenoma  . ESOPHAGEAL DILATION N/A 05/28/2014   Procedure: ESOPHAGEAL DILATION;  Surgeon: Daneil Dolin, MD;  [facility];  Service: Endoscopy;  Laterality: N/A;  . ESOPHAGOGASTRODUODENOSCOPY  11/08/2004   BZJ:IRCVEL esophageal erosions consistent with erosive reflux esophagitis/Areas of hemorrhage and nodularity of the fundal mucosa of uncertain significance, biopsied.  Small hiatal hernia, otherwise normal stomach  . ESOPHAGOGASTRODUODENOSCOPY  2010   Dr. Gala Romney: 3 columns Grade 1 varices, erosive esophagitis, HH, portal gastropathy, normal D1, D2  . ESOPHAGOGASTRODUODENOSCOPY N/A 05/28/2014   Dr. Gala Romney: MIld erosive reflux esophagitis. Grade 1 esophageal varices. Patent esophagus. No dilation performed. Hiatal hernia.   Marland Kitchen ESOPHAGOGASTRODUODENOSCOPY (EGD) WITH ESOPHAGEAL DILATION N/A 02/14/2013   FYB:OFBPZ 1 esophageal varices. Abnormal distal esophagus/status post biopsy after Maloney dilation. Portal gastropathy. Antral erosions-status post  biopsy. path negative for H.pylori, benign path.  . LUMBAR [place]; ~ 1995; ~ 1996  . NASAL SEPTUM SURGERY  1992  . nuclear stress test  10/19/2004   No ischemia  . PERMANENT PACEMAKER INSERTION  01/08/2012   CHB  . PERMANENT PACEMAKER INSERTION N/A 01/09/2012   Procedure: PERMANENT PACEMAKER INSERTION;  Surgeon: Sanda Klein, MD;  Location: [place] CATH LAB;  Service: Cardiovascular;  Laterality: N/A;  . POSTERIOR FUSION LUMBAR SPINE  1999   L4-5  . SPINAL CORD STIMULATOR IMPLANT  2006  . SPINAL CORD STIMULATOR REMOVAL N/A 01/27/2015    Procedure: LUMBAR SPINAL CORD STIMULATOR REMOVAL;  Surgeon: Kristeen Miss, MD;  Location: [place] NEURO ORS;  Service: Neurosurgery;  Laterality: N/A;  LUMBAR SPINAL CORD STIMULATOR REMOVAL  . TONSILLECTOMY AND ADENOIDECTOMY  1992  . US ECHOCARDIOGRAPHY  12/28/2011   mild LVH,mild mitral annulara ca+,mild MR  . Warthin's tumor excision  1990's   right    There were no vitals filed for this visit.       Subjective Assessment - 10/21/16 1740    Subjective No reports of pain just stings, dressings have slid down.                   Wound Therapy - 10/21/16 1741    Subjective No reports of pain just stings, dressings have slid down.   Patient and Family Stated Goals no pain, wound to heal    Date of Onset 07/25/16   Prior Treatments stiches, antibiotics. wound care with debridement and compression dressing.    Pain Assessment No/denies pain   Evaluation and Treatment Procedures Explained to Patient/Family Yes   Evaluation and Treatment Procedures agreed to   Wound Properties Date First Assessed: 09/14/16 Time First Assessed: 1024 Wound Type: Laceration Location: Leg Location Orientation: Left Wound Description (Comments): 3 seperate wounds the largest is 1.2 x .4x .2 Present on Admission: Yes   Dressing Type Impregnated gauze (bismuth);Compression wrap  xeroform, 4x4, profore lite and netting wiht medipore tape   Dressing Changed Changed   Dressing Status None   Dressing Change Frequency PRN   Site / Wound Assessment Clean;Dry;Granulation tissue   % Wound base Red or Granulating 100%   % Wound base Yellow/Fibrinous Exudate 0%   Peri-wound Assessment Edema;Other (Comment)   Wound Length (cm) 5.4 cm  was 5.5   Wound Width (cm) 2.5 cm  was 2.8   Wound Depth (cm) 0.3 cm  was .3   Drainage Amount None   Treatment Cleansed   Selective Debridement - Location no debridement necessary following cleaning   Selective Debridement - Tissue Removed slough removed while cleaning   Wound  Therapy - Clinical Statement Pt arrived with dressings intact, no reports of pain just stinging and dressings have slid down at entrance.  No selective debridement necessary following cleansing.  Continued with profore lite dressings to address edema.  No reports of pain through session.  Anticipate wound to close next couple sessions   Wound Therapy - Functional Problem List decreased activity tolerance due to pain.    Factors Delaying/Impairing Wound Healing Altered sensation;Diabetes Mellitus;Infection - systemic/local;Multiple medical problems;Polypharmacy;Vascular compromise   Hydrotherapy Plan Debridement;Dressing change;Patient/family education;Other (comment)   Wound Therapy - Frequency Other (comment)  2x/week for 2 weeks   Wound Therapy - Current Recommendations PT   Wound Plan debridement, dressing change and compression dressing    Dressing  vaseline perimeter, xeroform, 4x4, profore lite compression wrap with medipore tape at top  to keep from sliding                   PT Short Term Goals - 10/18/16 1436      PT SHORT TERM GOAL #1   Title Pt wound to be 100% healed    Time 2   Period Weeks   Status New     PT SHORT TERM GOAL #2   Title Pt pain level to be decreased to no greater than a 0/10 to allow pt to tolerate being up for over 2 hours at a time    Time 2   Period Weeks   Status New     PT SHORT TERM GOAL #3   Title Pt to verbalize the importance of wearing compression garments; to have obtained and be able to don compression garment   Time 2   Period Days   Status New           PT Long Term Goals - 10/18/16 1440              Patient will benefit from skilled therapeutic intervention in order to improve the following deficits and impairments:     Visit Diagnosis: Pain in left leg  Laceration of left leg excluding thigh, with complication, sequela     Problem List Patient Active Problem List   Diagnosis Date Noted  .  Hyperlipidemia 04/02/2015  . Essential hypertension, benign 04/02/2015  . Other specified hypothyroidism 04/02/2015  . History of colonic polyps   . Encounter for screening colonoscopy 05/07/2014  . Psoriasis 10/15/2013  . Hypoxemia 09/05/2013  . SVT (supraventricular tachycardia) ([place]) 06/06/2013  . COPD (chronic obstructive pulmonary disease) ([place]) 06/06/2013  . Cardiac pacemaker in situ 06/06/2013  . Pacemaker 05/22/2013  . Cirrhosis ([place]) 01/22/2013  . Dyspepsia 01/22/2013  . Anemia 01/22/2013  . Atrial fibrillation ([place]) 11/21/2012  . Atrial flutter ([place]) 11/21/2012  . Chest pain 11/20/2012  .  Acute on chronic diastolic heart failure, predominantly Right heart failure 01/09/2012  . Syncope 01/06/2012  . Sinus arrest, 5 second pause on event monitor 01/06/2012  . Type 2 diabetes mellitus, uncontrolled ([place]) 01/06/2012  . Obesity 01/06/2012  . Smoker 01/06/2012  . Nocturnal hypoxemia 01/06/2012  . DJD, on disability secondary to back pain, surg X 3 01/06/2012  . ABDOMINAL PAIN, RIGHT UPPER QUADRANT 06/14/2009  . ABDOMINAL PAIN 05/14/2009  . Hepatic cirrhosis ([place]) 01/13/2009  . GERD 12/04/2008  . GASTRITIS, ACUTE 12/04/2008  . HOARSENESS 12/04/2008  . CHEST PAIN 12/04/2008  . Dysphagia, pharyngoesophageal phase 12/04/2008   Ihor Austin, LPTA; [place]  Aldona Lento 10/21/2016, 5:49 PM  [facility] [address] [place], [place], [address] Phone: [phone]   Fax:  [phone]  Name: Ralph [name] MRN: [id number] Date of Birth: 1950-08-07

## 2016-10-22 ENCOUNTER — Other Ambulatory Visit: Payer: Self-pay | Admitting: "Endocrinology

## 2016-10-23 DIAGNOSIS — R69 Illness, unspecified: Secondary | ICD-10-CM | POA: Diagnosis not present

## 2016-10-26 ENCOUNTER — Ambulatory Visit (HOSPITAL_COMMUNITY): Payer: Medicare HMO

## 2016-10-26 DIAGNOSIS — S81812S Laceration without foreign body, left lower leg, sequela: Secondary | ICD-10-CM

## 2016-10-26 DIAGNOSIS — M79605 Pain in left leg: Secondary | ICD-10-CM | POA: Diagnosis not present

## 2016-10-26 NOTE — Therapy (Signed)
[place], [place], [address] Phone: [phone]   Fax:  [phone]  Wound Care Therapy  Patient Details  Name: Ralph [name] MRN: [id number] Date of Birth: 1950-08-06 Referring Provider: Nicholes Mango  Encounter Date: 10/26/2016      PT End of Session - 10/26/16 0839    Visit Number 3   Number of Visits 4   Date for PT Re-Evaluation 11/01/16   Authorization Type medicare   Authorization - Visit Number 3   Authorization - Number of Visits 4   PT Start Time 9678   PT Stop Time 9381   PT Time Calculation (min) 12 min   Activity Tolerance Patient tolerated treatment well;No increased pain   Behavior During Therapy WFL for tasks assessed/performed      Past Medical History:  Diagnosis Date  . Anginal pain ([place])   . Arthritis    "back; fingers" (01/06/2012)  . Atrial flutter [facility])    s/p EPS +RF ablation of typical atrial flutter April 2015  . Cancer ([place])    skin cancer  . CHB (complete heart block) ([place])   . CHF (congestive heart failure) ([place]) 01/06/2012  . Chronic lower back pain   . 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 Coughing up blood    "comes from my throat" (01/06/2012)  . DDD (degenerative disc disease), lumbar   . Depressed   . Difficult intubation    Eschmann stylet used in 2002 and 2007; "trouble waking up afterwards" (01/06/2012)  . Emphysema   . Fatty liver disease, nonalcoholic   . GERD (gastroesophageal reflux disease)   . H/O hiatal hernia   . Headache   . Hypertension   . Hypothyroidism   . Pacemaker   . Pneumonia Aug 2016  . Presence of permanent cardiac pacemaker 9/292013   [employer]  . Sinus pause 01/06/2012   5.2 seconds  . Sleep apnea    "don't wear mask" (01/06/2012)  . Stroke [facility])    pt states that he might have had a stroke not sure  . Type II diabetes mellitus ([place])   . Varicose vein    of esophagus    Past Surgical History:  Procedure Laterality  Date  . ATRIAL FLUTTER ABLATION N/A 07/10/2013   Procedure: ATRIAL FLUTTER ABLATION;  Surgeon: Evans Lance, MD;  Location: [facility] CATH LAB;  Service: Cardiovascular;  Laterality: N/A;  . BACK SURGERY    . CHOLECYSTECTOMY  1993  . COLONOSCOPY  11/08/2004   OFB:PZWCHE rectum, colon, TI.  Marland Kitchen COLONOSCOPY N/A 05/28/2014   Dr. Gala Romney: Redundant colon. single colonic polyp removed as described above. Tubular adenoma  . ESOPHAGEAL DILATION N/A 05/28/2014   Procedure: ESOPHAGEAL DILATION;  Surgeon: Daneil Dolin, MD;  [facility];  Service: Endoscopy;  Laterality: N/A;  . ESOPHAGOGASTRODUODENOSCOPY  11/08/2004   NID:POEUMP esophageal erosions consistent with erosive reflux esophagitis/Areas of hemorrhage and nodularity of the fundal mucosa of uncertain significance, biopsied.  Small hiatal hernia, otherwise normal stomach  . ESOPHAGOGASTRODUODENOSCOPY  2010   Dr. Gala Romney: 3 columns Grade 1 varices, erosive esophagitis, HH, portal gastropathy, normal D1, D2  . ESOPHAGOGASTRODUODENOSCOPY N/A 05/28/2014   Dr. Gala Romney: MIld erosive reflux esophagitis. Grade 1 esophageal varices. Patent esophagus. No dilation performed. Hiatal hernia.   Marland Kitchen ESOPHAGOGASTRODUODENOSCOPY (EGD) WITH ESOPHAGEAL DILATION N/A 02/14/2013   NTI:RWERX 1 esophageal varices. Abnormal distal esophagus/status post biopsy after Maloney dilation. Portal gastropathy. Antral erosions-status post  biopsy. path negative for H.pylori, benign path.  . LUMBAR [place]; ~ 1995; ~ 1996  . NASAL SEPTUM SURGERY  1992  . nuclear stress test  10/19/2004   No ischemia  . PERMANENT PACEMAKER INSERTION  01/08/2012   CHB  . PERMANENT PACEMAKER INSERTION N/A 01/09/2012   Procedure: PERMANENT PACEMAKER INSERTION;  Surgeon: Sanda Klein, MD;  Location: [facility] CATH LAB;  Service: Cardiovascular;  Laterality: N/A;  . POSTERIOR FUSION LUMBAR SPINE  1999   L4-5  . SPINAL CORD STIMULATOR IMPLANT  2006  . SPINAL CORD STIMULATOR REMOVAL N/A 01/27/2015    Procedure: LUMBAR SPINAL CORD STIMULATOR REMOVAL;  Surgeon: Kristeen Miss, MD;  Location: [place] NEURO ORS;  Service: Neurosurgery;  Laterality: N/A;  LUMBAR SPINAL CORD STIMULATOR REMOVAL  . TONSILLECTOMY AND ADENOIDECTOMY  1992  . US ECHOCARDIOGRAPHY  12/28/2011   mild LVH,mild mitral annulara ca+,mild MR  . Warthin's tumor excision  1990's   right    There were no vitals filed for this visit.       Subjective Assessment - 10/26/16 0833    Subjective No reports of pain just itchey heel, arrived wiht dressings intact   Currently in Pain? No/denies                   Wound Therapy - 10/26/16 0834    Subjective No reports of pain just itchey heel, arrived wiht dressings intact   Patient and Family Stated Goals no pain, wound to heal    Date of Onset 07/25/16   Prior Treatments stiches, antibiotics. wound care with debridement and compression dressing.    Pain Assessment No/denies pain   Evaluation and Treatment Procedures Explained to Patient/Family Yes   Evaluation and Treatment Procedures agreed to   Wound Properties Date First Assessed: 09/14/16 Time First Assessed: 1024 Wound Type: Laceration Location: Leg Location Orientation: Left Wound Description (Comments): 3 seperate wounds the largest is 1.2 x .4x .2 Present on Admission: Yes   Dressing Type None   Dressing Status None   % Wound base Red or Granulating 100%   % Wound base Yellow/Fibrinous Exudate 0%   Peri-wound Assessment Edema;Other (Comment)  variocosities   Wound Length (cm) 0 cm  was 5.4   Wound Width (cm) 0 cm  was 2.8   Wound Depth (cm) 0 cm  was .3   Drainage Amount None   Treatment --  Educated importance of compression for edema control   Selective Debridement - Location no debridement necessary    Wound Therapy - Clinical Statement Wound healed upon removal of dressings.  Pt educated on importance of compression hose for edema control.  Pt stated he would purchase upon next pay check, pt encouraged  to elevate LE daily for edema control until purchases and to wear daily.      Wound Therapy - Functional Problem List decreased activity tolerance due to pain.    Factors Delaying/Impairing Wound Healing Altered sensation;Diabetes Mellitus;Infection - systemic/local;Multiple medical problems;Polypharmacy;Vascular compromise   Hydrotherapy Plan Debridement;Dressing change;Patient/family education;Other (comment)   Wound Therapy - Frequency --  2x/week x 4 weeks   Wound Therapy - Current Recommendations PT   Wound Plan DC per wound healed                   PT Short Term Goals - 10/18/16 1436      PT SHORT TERM GOAL #1   Title Pt wound to be 100% healed    Time 2  Period Weeks   Status New     PT SHORT TERM GOAL #2   Title Pt pain level to be decreased to no greater than a 0/10 to allow pt to tolerate being up for over 2 hours at a time    Time 2   Period Weeks   Status New     PT SHORT TERM GOAL #3   Title Pt to verbalize the importance of wearing compression garments; to have obtained and be able to don compression garment   Time 2   Period Days   Status New           PT Long Term Goals - 10/18/16 1440              Patient will benefit from skilled therapeutic intervention in order to improve the following deficits and impairments:     Visit Diagnosis: Pain in left leg  Laceration of left leg excluding thigh, with complication, sequela      G-Codes - Nov 05, 2016 4827    Functional Limitation Other PT primary   Other PT Primary Goal Status (M7867) At least 1 percent but less than 20 percent impaired, limited or restricted   Other PT Primary Discharge Status (J4492) 0 percent impaired, limited or restricted       Problem List Patient Active Problem List   Diagnosis Date Noted  . Hyperlipidemia 04/02/2015  . Essential hypertension, benign 04/02/2015  . Other specified hypothyroidism 04/02/2015  . History of colonic polyps   . Encounter for  screening colonoscopy 05/07/2014  . Psoriasis 10/15/2013  . Hypoxemia 09/05/2013  . SVT (supraventricular tachycardia) ([place]) 06/06/2013  . COPD (chronic obstructive pulmonary disease) ([place]) 06/06/2013  . Cardiac pacemaker in situ 06/06/2013  . Pacemaker 05/22/2013  . Cirrhosis ([place]) 01/22/2013  . Dyspepsia 01/22/2013  . Anemia 01/22/2013  . Atrial fibrillation ([place]) 11/21/2012  . Atrial flutter ([place]) 11/21/2012  . Chest pain 11/20/2012  .  Acute on chronic diastolic heart failure, predominantly Right heart failure 01/09/2012  . Syncope 01/06/2012  . Sinus arrest, 5 second pause on event monitor 01/06/2012  . Type 2 diabetes mellitus, uncontrolled ([place]) 01/06/2012  . Obesity 01/06/2012  . Smoker 01/06/2012  . Nocturnal hypoxemia 01/06/2012  . DJD, on disability secondary to back pain, surg X 3 01/06/2012  . ABDOMINAL PAIN, RIGHT UPPER QUADRANT 06/14/2009  . ABDOMINAL PAIN 05/14/2009  . Hepatic cirrhosis ([place]) 01/13/2009  . GERD 12/04/2008  . GASTRITIS, ACUTE 12/04/2008  . HOARSENESS 12/04/2008  . CHEST PAIN 12/04/2008  . Dysphagia, pharyngoesophageal phase 12/04/2008   Ihor Austin, LPTA; [place] Rayetta Humphrey, PT CLT [phone] Nov 05, 2016, 8:44 AM  [facility] [address] [place], [place], [address] Phone: [phone]   Fax:  [phone]  Name: Ralph [name] MRN: [id number] Date of Birth: December 31, 1950  PHYSICAL THERAPY DISCHARGE SUMMARY  Visits from Start of Care: 3  Current functional level related to goals / functional outcomes: See above    Remaining deficits: none   Education / Equipment: See above  Plan: Patient agrees to discharge.  Patient goals were met. Patient is being discharged due to meeting the stated rehab goals.  ?????       Rayetta Humphrey, [place] CLT [phone]

## 2016-11-16 ENCOUNTER — Ambulatory Visit: Payer: Medicare HMO | Admitting: Cardiology

## 2016-11-21 ENCOUNTER — Ambulatory Visit (HOSPITAL_COMMUNITY)
Admission: RE | Admit: 2016-11-21 | Discharge: 2016-11-21 | Disposition: A | Discharge status: Home / Self Care | Payer: Medicare HMO | Source: Ambulatory Visit | Attending: Family Medicine | Admitting: Family Medicine

## 2016-11-21 ENCOUNTER — Other Ambulatory Visit (HOSPITAL_COMMUNITY): Payer: Self-pay | Admitting: Family Medicine

## 2016-11-21 ENCOUNTER — Other Ambulatory Visit: Payer: Self-pay | Admitting: "Endocrinology

## 2016-11-21 DIAGNOSIS — E1165 Type 2 diabetes mellitus with hyperglycemia: Secondary | ICD-10-CM | POA: Diagnosis not present

## 2016-11-21 DIAGNOSIS — E038 Other specified hypothyroidism: Secondary | ICD-10-CM | POA: Diagnosis not present

## 2016-11-21 DIAGNOSIS — E1159 Type 2 diabetes mellitus with other circulatory complications: Secondary | ICD-10-CM | POA: Diagnosis not present

## 2016-11-21 DIAGNOSIS — M1612 Unilateral primary osteoarthritis, left hip: Secondary | ICD-10-CM | POA: Insufficient documentation

## 2016-11-21 DIAGNOSIS — Z794 Long term (current) use of insulin: Secondary | ICD-10-CM | POA: Diagnosis not present

## 2016-11-21 DIAGNOSIS — Z6833 Body mass index (BMI) 33.0-33.9, adult: Secondary | ICD-10-CM | POA: Diagnosis not present

## 2016-11-21 DIAGNOSIS — M25552 Pain in left hip: Secondary | ICD-10-CM

## 2016-11-21 DIAGNOSIS — L03119 Cellulitis of unspecified part of limb: Secondary | ICD-10-CM | POA: Diagnosis not present

## 2016-11-21 DIAGNOSIS — E6609 Other obesity due to excess calories: Secondary | ICD-10-CM | POA: Diagnosis not present

## 2016-11-21 DIAGNOSIS — Z1389 Encounter for screening for other disorder: Secondary | ICD-10-CM | POA: Diagnosis not present

## 2016-11-21 LAB — COMPREHENSIVE METABOLIC PANEL
ALK PHOS: 110 U/L (ref 40–115)
ALT: 29 U/L (ref 9–46)
AST: 20 U/L (ref 10–35)
Albumin: 4.3 g/dL (ref 3.6–5.1)
BILIRUBIN TOTAL: 0.5 mg/dL (ref 0.2–1.2)
BUN: 23 mg/dL (ref 7–25)
CALCIUM: 9.4 mg/dL (ref 8.6–10.3)
CO2: 24 mmol/L (ref 20–32)
Chloride: 99 mmol/L (ref 98–110)
Creat: 1.02 mg/dL (ref 0.70–1.25)
GLUCOSE: 117 mg/dL — AB (ref 65–99)
POTASSIUM: 4.5 mmol/L (ref 3.5–5.3)
Sodium: 137 mmol/L (ref 135–146)
Total Protein: 7 g/dL (ref 6.1–8.1)

## 2016-11-21 LAB — TSH: TSH: 0.98 m[IU]/L (ref 0.40–4.50)

## 2016-11-21 LAB — T4, FREE: Free T4: 1.6 ng/dL (ref 0.8–1.8)

## 2016-11-22 ENCOUNTER — Ambulatory Visit: Payer: Medicare HMO | Admitting: "Endocrinology

## 2016-11-22 DIAGNOSIS — Z6833 Body mass index (BMI) 33.0-33.9, adult: Secondary | ICD-10-CM | POA: Diagnosis not present

## 2016-11-22 DIAGNOSIS — L03119 Cellulitis of unspecified part of limb: Secondary | ICD-10-CM | POA: Diagnosis not present

## 2016-11-22 DIAGNOSIS — E6609 Other obesity due to excess calories: Secondary | ICD-10-CM | POA: Diagnosis not present

## 2016-11-22 LAB — HEMOGLOBIN A1C
HEMOGLOBIN A1C: 7.1 % — AB (ref <5.7)
MEAN PLASMA GLUCOSE: 157 mg/dL

## 2016-11-23 ENCOUNTER — Encounter: Payer: Self-pay | Admitting: Internal Medicine

## 2016-11-30 ENCOUNTER — Other Ambulatory Visit: Payer: Self-pay | Admitting: "Endocrinology

## 2016-11-30 DIAGNOSIS — R69 Illness, unspecified: Secondary | ICD-10-CM | POA: Diagnosis not present

## 2016-12-06 ENCOUNTER — Encounter: Payer: Self-pay | Admitting: "Endocrinology

## 2016-12-06 ENCOUNTER — Ambulatory Visit (INDEPENDENT_AMBULATORY_CARE_PROVIDER_SITE_OTHER): Payer: Medicare HMO | Admitting: "Endocrinology

## 2016-12-06 VITALS — BP 121/76 | HR 96 | Ht 70.0 in | Wt 221.0 lb

## 2016-12-06 DIAGNOSIS — E782 Mixed hyperlipidemia: Secondary | ICD-10-CM

## 2016-12-06 DIAGNOSIS — Z794 Long term (current) use of insulin: Secondary | ICD-10-CM

## 2016-12-06 DIAGNOSIS — E1159 Type 2 diabetes mellitus with other circulatory complications: Secondary | ICD-10-CM

## 2016-12-06 DIAGNOSIS — IMO0002 Reserved for concepts with insufficient information to code with codable children: Secondary | ICD-10-CM

## 2016-12-06 DIAGNOSIS — E1165 Type 2 diabetes mellitus with hyperglycemia: Secondary | ICD-10-CM | POA: Diagnosis not present

## 2016-12-06 DIAGNOSIS — Z6830 Body mass index (BMI) 30.0-30.9, adult: Secondary | ICD-10-CM

## 2016-12-06 DIAGNOSIS — E038 Other specified hypothyroidism: Secondary | ICD-10-CM | POA: Diagnosis not present

## 2016-12-06 DIAGNOSIS — E1129 Type 2 diabetes mellitus with other diabetic kidney complication: Secondary | ICD-10-CM | POA: Diagnosis not present

## 2016-12-06 DIAGNOSIS — I10 Essential (primary) hypertension: Secondary | ICD-10-CM | POA: Diagnosis not present

## 2016-12-06 DIAGNOSIS — E6609 Other obesity due to excess calories: Secondary | ICD-10-CM

## 2016-12-06 MED ORDER — DAPAGLIFLOZIN PROPANEDIOL 10 MG PO TABS: 10.0000 mg | ORAL_TABLET | Freq: Every day | ORAL | 3 refills | Status: DC

## 2016-12-06 NOTE — Patient Instructions (Signed)
Advice for Weight Management -For most of us the best way to lose weight is by diet management. Generally speaking, diet management means restricting carbohydrate consumption to minimum possible (and to unprocessed or minimally processed complex starch) and increasing protein intake (animal or plant source), fruits, and vegetables.  -Sticking to a routine mealtime to eat 3 meals a day and avoiding unnecessary snacks is shown to have a big role in weight control.  -It is better to avoid simple carbohydrates including: Cakes, Sweet Desserts, Ice Cream, Soda (diet and regular), Sweet Tea, Candies, Chips, Cookies, Store Bought Juices, Alcohol in Excess of  1-2 drinks a day, Artificial Sweeteners, and "Sugar-free" Products.   -Exercise: 30 -60 minutes a day 3-4 days a week, or 150 minutes a week. Combine stretch, strength, and aerobic activities. You may seek evaluation by your heart doctor prior to initiating exercise if you have high risk for heart disease.  -If you are interested, we can schedule a visit with Ralph [name], RDN, CDE for individualized nutrition education.  

## 2016-12-06 NOTE — Progress Notes (Signed)
Subjective:    Patient ID: Ralph [name], male    DOB: 1950/09/03, PCP Sharilyn Sites, MD   Past Medical History:  Diagnosis Date  . Anginal pain ([place])   . Arthritis    "back; fingers" (01/06/2012)  . Atrial flutter [facility])    s/p EPS +RF ablation of typical atrial flutter April 2015  . Cancer ([place])    skin cancer  . CHB (complete heart block) ([place])   . CHF (congestive heart failure) ([place]) 01/06/2012  . Chronic lower back pain   . 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 Coughing up blood    "comes from my throat" (01/06/2012)  . DDD (degenerative disc disease), lumbar   . Depressed   . Difficult intubation    Eschmann stylet used in 2002 and 2007; "trouble waking up afterwards" (01/06/2012)  . Emphysema   . Fatty liver disease, nonalcoholic   . GERD (gastroesophageal reflux disease)   . H/O hiatal hernia   . Headache   . Hypertension   . Hypothyroidism   . Pacemaker   . Pneumonia Aug 2016  . Presence of permanent cardiac pacemaker [id number]/[id number]   [place]  . Sinus pause 01/06/2012   5.2 seconds  . Sleep apnea    "don't wear mask" (01/06/2012)  . Stroke [facility])    pt states that he might have had a stroke not sure  . Type II diabetes mellitus ([place])   . Varicose vein    of esophagus   Past Surgical History:  Procedure Laterality Date  . ATRIAL FLUTTER ABLATION N/A 07/10/2013   Procedure: ATRIAL FLUTTER ABLATION;  Surgeon: Evans Lance, MD;  Location: [facility] CATH LAB;  Service: Cardiovascular;  Laterality: N/A;  . BACK SURGERY    . CHOLECYSTECTOMY  1993  . COLONOSCOPY  11/08/2004   DDU:KGURKY rectum, colon, TI.  Marland Kitchen COLONOSCOPY N/A 05/28/2014   Dr. Gala Romney: Redundant colon. single colonic polyp removed as described above. Tubular adenoma  . ESOPHAGEAL DILATION N/A 05/28/2014   Procedure: ESOPHAGEAL DILATION;  Surgeon: Daneil Dolin, MD;  Location[facility] ENDO [facility];  Service: Endoscopy;  Laterality: N/A;  . ESOPHAGOGASTRODUODENOSCOPY  11/08/2004   HCW:CBJSEG esophageal erosions  consistent with erosive reflux esophagitis/Areas of hemorrhage and nodularity of the fundal mucosa of uncertain significance, biopsied.  Small hiatal hernia, otherwise normal stomach  . ESOPHAGOGASTRODUODENOSCOPY  2010   Dr. Gala Romney: 3 columns Grade 1 varices, erosive esophagitis, HH, portal gastropathy, normal D1, D2  . ESOPHAGOGASTRODUODENOSCOPY N/A 05/28/2014   Dr. Gala Romney: MIld erosive reflux esophagitis. Grade 1 esophageal varices. Patent esophagus. No dilation performed. Hiatal hernia.   Marland Kitchen ESOPHAGOGASTRODUODENOSCOPY (EGD) WITH ESOPHAGEAL DILATION N/A 02/14/2013   BTD:VVOHY 1 esophageal varices. Abnormal distal esophagus/status post biopsy after Maloney dilation. Portal gastropathy. Antral erosions-status post biopsy. path negative for H.pylori, benign path.  . LUMBAR [place]; ~ 1995; ~ 1996  . NASAL SEPTUM SURGERY  1992  . nuclear stress test  10/19/2004   No ischemia  . PERMANENT PACEMAKER INSERTION  01/08/2012   CHB  . PERMANENT PACEMAKER INSERTION N/A 01/09/2012   Procedure: PERMANENT PACEMAKER INSERTION;  Surgeon: Sanda Klein, MD;  Location: [place] CATH LAB;  Service: Cardiovascular;  Laterality: N/A;  . POSTERIOR FUSION LUMBAR SPINE  1999   L4-5  . SPINAL CORD STIMULATOR IMPLANT  2006  . SPINAL CORD STIMULATOR REMOVAL N/A 01/27/2015   Procedure: LUMBAR SPINAL CORD STIMULATOR REMOVAL;  Surgeon: Kristeen Miss, MD;  Location: [place] NEURO ORS;  Service: Neurosurgery;  Laterality: N/A;  LUMBAR SPINAL CORD STIMULATOR REMOVAL  . TONSILLECTOMY AND ADENOIDECTOMY  1992  . US ECHOCARDIOGRAPHY  12/28/2011   mild LVH,mild mitral annulara ca+,mild MR  . Warthin's tumor excision  1990's   right   Social History   Social History  . Marital status: Married    Spouse name: N/A  . Number of children: N/A  . Years of education: N/A   Social History Main Topics  . Smoking status: Current Every Day Smoker    Packs/day: 0.25    Years: 45.00    Types: Cigarettes    Start date: 04/11/1968    Last  attempt to quit: 04/12/2015  . Smokeless tobacco: Never Used     Comment: Quit x 8 months this time  . Alcohol use No     Comment: "quit alcohol 2011" Previously drinking socially about twice per month  . Drug use: No  . Sexual activity: No   Other Topics Concern  . None   Social History Narrative   Lives with wife in a one story home.  Has 3 children.     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     Education: some college.   Outpatient Encounter Prescriptions as of 12/06/2016  Medication Sig  . ANORO ELLIPTA 62.5-25 MCG/INH AEPB INHALE 1 PUFF ONCE DAILY (Patient not taking: Reported on 07/01/2016)  . Apremilast (OTEZLA) 30 MG TABS Take 30 mg by mouth 2 (two) times daily.  Marland Kitchen buPROPion (WELLBUTRIN SR) 150 MG 12 hr tablet Take 150 mg by mouth 2 (two) times daily.  . Cholecalciferol (VITAMIN D PO) Take 1 tablet by mouth daily.  . Cyanocobalamin (VITAMIN B 12 PO) Take 1 tablet by mouth daily.  . dapagliflozin propanediol (FARXIGA) 10 MG TABS tablet Take 10 mg by mouth daily.  . diazepam (VALIUM) 10 MG tablet Take 10 mg by mouth every 6 (six) hours as needed for anxiety.  Marland Kitchen diltiazem (CARDIZEM CD) 180 MG 24 hr capsule TAKE ONE CAPSULE BY MOUTH EVERY DAY  . escitalopram (LEXAPRO) 10 MG tablet Take 10 mg by mouth daily.  . flecainide (TAMBOCOR) 100 MG tablet TAKE 1 TABLET BY MOUTH TWICE A DAY  . furosemide (LASIX) 20 MG tablet Take 2 tablets (40 mg total) by mouth daily as needed (swelling).  Marland Kitchen HYDROcodone-acetaminophen (NORCO/VICODIN) 5-325 MG tablet Take 2 tablets by mouth every 4 (four) hours as needed.  . Insulin Detemir (LEVEMIR FLEXTOUCH) 100 UNIT/ML Pen Inject 50 Units into the skin at bedtime.  . metFORMIN (GLUCOPHAGE) 1000 MG tablet TAKE 1 TABLET BY MOUTH TWICE A DAY  . Multiple Vitamin (MULTIVITAMIN) tablet Take 1 tablet by mouth daily.  . nadolol (CORGARD) 40 MG tablet TAKE 1 TABLET IN THE MORNING AND 1/2 TABLET BY MOUTH EVERY EVENING  . Omega-3 Fatty Acids (FISH OIL) 1200 MG CAPS  Take by mouth 2 (two) times daily.  . ondansetron (ZOFRAN) 4 MG tablet Take 1 tablet (4 mg total) by mouth every 8 (eight) hours as needed for nausea or vomiting.  Glory Rosebush VERIO test strip USE TO TEST BLOOD SUGAR FOUR TIMES A DAY  . OXYGEN Inhale 2 L into the lungs at bedtime.  . pantoprazole (PROTONIX) 40 MG tablet TAKE 1 TABLET (40 MG TOTAL) BY MOUTH DAILY.  . simvastatin (ZOCOR) 20 MG tablet TAKE 1 TABLET (20 MG TOTAL) BY MOUTH EVERY EVENING.  Marland Kitchen spironolactone (ALDACTONE) 50 MG tablet TAKE 1 TABLET (50 MG TOTAL) BY MOUTH 2 (TWO) TIMES DAILY.  Marland Kitchen  SYNTHROID 112 MCG tablet TAKE 1 TABLET BY MOUTH EVERY MORNING BEFORE BREAKFAST  . traMADol (ULTRAM) 50 MG tablet Take 50 mg by mouth every 6 (six) hours as needed.  Alveda Reasons 20 MG TABS tablet TAKE 1 TABLET BY MOUTH EVERY DAY WITH DINNER  . [DISCONTINUED] INVOKANA 100 MG TABS tablet TAKE 1 TABLET (100 MG TOTAL) BY MOUTH DAILY BEFORE BREAKFAST.  . [DISCONTINUED] LEVEMIR 100 UNIT/ML injection INJECT 70 UNITS SUBCUTANEOUS AT BEDTIME (Patient not taking: Reported on 07/01/2016)   No facility-administered encounter medications on file as of 12/06/2016.    ALLERGIES: Allergies  Allergen Reactions  . Nitroglycerin Hives, Swelling and Rash   VACCINATION STATUS: Immunization History  Administered Date(s) Administered  . Influenza Split 01/09/2013  . Influenza,inj,Quad PF,6+ Mos 01/09/2014, 12/23/2014  . Pneumococcal Polysaccharide-23 01/10/2012  . Tdap 07/25/2016    Diabetes  He presents for his follow-up diabetic visit. He has type 2 diabetes mellitus. Onset time: He was diagnosed at approximate age of 28 years. His disease course has been stable. There are no hypoglycemic associated symptoms. Pertinent negatives for hypoglycemia include no confusion, headaches, pallor or seizures. There are no diabetic associated symptoms. Pertinent negatives for diabetes include no chest pain, no fatigue, no polydipsia, no polyphagia, no polyuria and no weakness.  There are no hypoglycemic complications. Symptoms are stable. Diabetic complications include a CVA, heart disease and nephropathy. Risk factors for coronary artery disease include diabetes mellitus, dyslipidemia, hypertension, male sex, obesity, sedentary lifestyle and tobacco exposure. Current diabetic treatment includes intensive insulin program. He is compliant with treatment most of the time. His weight is decreasing steadily. He is following a diabetic diet. When asked about meal planning, he reported none. He participates in exercise intermittently. There is no change in his home blood glucose trend. His breakfast blood glucose range is generally 130-140 mg/dl. His overall blood glucose range is 130-140 mg/dl.  Hyperlipidemia  This is a chronic problem. The current episode started more than 1 year ago. Pertinent negatives include no chest pain, myalgias or shortness of breath. Current antihyperlipidemic treatment includes statins. Risk factors for coronary artery disease include dyslipidemia, diabetes mellitus, hypertension, male sex and a sedentary lifestyle.  Hypertension  This is a chronic problem. The current episode started more than 1 year ago. Pertinent negatives include no chest pain, headaches, neck pain, palpitations or shortness of breath. Risk factors for coronary artery disease include diabetes mellitus, dyslipidemia, obesity, sedentary lifestyle and smoking/tobacco exposure. Compliance problems include diet.  Hypertensive end-organ damage includes CVA. Identifiable causes of hypertension include a thyroid problem.  Thyroid Problem  Presents for follow-up visit. Patient reports no cold intolerance, constipation, diarrhea, fatigue, heat intolerance or palpitations. The symptoms have been improving. Past treatments include levothyroxine. His past medical history is significant for hyperlipidemia.     Review of Systems  Constitutional: Negative for fatigue and unexpected weight change.   HENT: Negative for dental problem, mouth sores and trouble swallowing.   Eyes: Negative for visual disturbance.  Respiratory: Negative for cough, choking, chest tightness, shortness of breath and wheezing.   Cardiovascular: Negative for chest pain, palpitations and leg swelling.  Gastrointestinal: Negative for abdominal distention, abdominal pain, constipation, diarrhea, nausea and vomiting.  Endocrine: Negative for cold intolerance, heat intolerance, polydipsia, polyphagia and polyuria.  Genitourinary: Negative for dysuria, flank pain, hematuria and urgency.  Musculoskeletal: Negative for back pain, gait problem, myalgias and neck pain.  Skin: Negative for pallor, rash and wound.  Neurological: Negative for seizures, syncope, weakness, numbness and headaches.  Psychiatric/Behavioral: Negative.  Negative for confusion and dysphoric mood.    Objective:    BP 121/76   Pulse 96   Ht 5\' 10"  (1.778 m)   Wt 221 lb (100.2 kg)   BMI 31.71 kg/m   Wt Readings from Last 3 Encounters:  12/06/16 221 lb (100.2 kg)  08/22/16 226 lb (102.5 kg)  07/24/16 224 lb (101.6 kg)    Physical Exam  Constitutional: He is oriented to person, place, and time. He appears well-developed and well-nourished. He is cooperative. No distress.  HENT:  Head: Normocephalic and atraumatic.  Eyes: EOM are normal.  Neck: Normal range of motion. Neck supple. No tracheal deviation present. No thyromegaly present.  Cardiovascular: Normal rate, S1 normal, S2 normal and normal heart sounds.  Exam reveals no gallop.   No murmur heard. Pulses:      Dorsalis pedis pulses are 1+ on the right side, and 1+ on the left side.       Posterior tibial pulses are 1+ on the right side, and 1+ on the left side.  Pulmonary/Chest: Breath sounds normal. No respiratory distress. He has no wheezes.  Abdominal: Soft. Bowel sounds are normal. He exhibits no distension. There is no tenderness. There is no guarding and no CVA tenderness.   Musculoskeletal: He exhibits no edema.       Right shoulder: He exhibits no swelling and no deformity.  Neurological: He is alert and oriented to person, place, and time. He has normal strength and normal reflexes. No cranial nerve deficit or sensory deficit. Gait normal.  Skin: Skin is warm and dry. No rash noted. No cyanosis. Nails show no clubbing.  Psychiatric: He has a normal mood and affect. His speech is normal and behavior is normal. Judgment and thought content normal. Cognition and memory are normal.    CMP     Component Value Date/Time   NA 137 11/21/2016 1232   K 4.5 11/21/2016 1232   CL 99 11/21/2016 1232   CO2 24 11/21/2016 1232   GLUCOSE 117 (H) 11/21/2016 1232   BUN 23 11/21/2016 1232   CREATININE 1.02 11/21/2016 1232   CALCIUM 9.4 11/21/2016 1232   PROT 7.0 11/21/2016 1232   ALBUMIN 4.3 11/21/2016 1232   ALBUMIN 3.3 12/17/2012   AST 20 11/21/2016 1232   AST 23 12/17/2012   ALT 29 11/21/2016 1232   ALKPHOS 110 11/21/2016 1232   ALKPHOS 423 12/17/2012   BILITOT 0.5 11/21/2016 1232   BILITOT 0.7 12/17/2012   GFRNONAA 87 01/04/2016 0809   GFRAA >89 01/04/2016 0809   Diabetic Labs (most recent): Lab Results  Component Value Date   HGBA1C 7.1 (H) 11/21/2016   HGBA1C 6.6 (H) 08/11/2016   HGBA1C 6.3 (H) 04/13/2016   Lipid Panel     Component Value Date/Time   CHOL 111 (L) 12/11/2015 0753   TRIG 276 (H) 12/11/2015 0753   HDL 30 (L) 12/11/2015 0753   CHOLHDL 3.7 12/11/2015 0753   VLDL 55 (H) 12/11/2015 0753   LDLCALC 26 12/11/2015 0753  Results for BENNETT, RAM (MRN [id number]) as of 12/06/2016 15:15  Ref. Range 11/21/2016 12:32  TSH Latest Ref Range: 0.40 - 4.50 mIU/L 0.98  T4,Free(Direct) Latest Ref Range: 0.8 - 1.8 ng/dL 1.6     Assessment & Plan:   1. Uncontrolled type 2 diabetes mellitus with complication, with long-term current use of insulin ([place])  - His diabetes is  complicated by coronary artery disease/cardiomyopathy, cerebrovascular  accident, and patient remains at a  high risk for more acute and chronic complications of diabetes which include CAD, CVA, CKD, retinopathy, and neuropathy. These are all discussed in detail with the patient.  Patient came with controlled fasting glucose profile, and stable A1c of 7.1%. Glucose logs and insulin administration records pertaining to this visit,  to be scanned into patient's records.  Recent labs reviewed.   - I have re-counseled the patient on diet management and weight loss  by adopting a carbohydrate restricted / protein rich  Diet.  - Suggestion is made for patient to avoid simple carbohydrates   from their diet including Cakes , Desserts, Ice Cream,  Soda (  diet and regular) , Sweet Tea , Candies,  Chips, Cookies, Artificial Sweeteners,   and "Sugar-free" Products .  This will help patient to have stable blood glucose profile and potentially avoid unintended  Weight gain.  - Patient is advised to stick to a routine mealtimes to eat 3 meals  a day and avoid unnecessary snacks ( to snack only to correct hypoglycemia). - I have approached patient with the following individualized plan to manage diabetes and patient agrees.  - He  continued to do well. I advised him to continue Levemir  50 units daily at bedtime, continue to hold NovoLog for now, continue monitoring of   blood glucose 2 times a day-for breakfast and at bedtime.  -Adjustment parameters for hypo and hyperglycemia were given in a written document to patient.  -Patient is encouraged to call clinic for blood glucose levels less than 70 or above 300 mg /dl.  -I will continue Metformin 1000mg  po BID. - I discussed him to switch his Invokana  To Farxiga  10 mg by mouth 2 AM. Precautions and side effects discussed with him.   - Patient specific target  for A1c; LDL, HDL, Triglycerides, and  Waist Circumference were discussed in detail.  2) BP/HTN: Controlled. Continue current medications . 3) Lipids/HPL:  continue  statins. 4)  Weight/Diet: He has lost significant weight, about 30 pounds since last year, CDE consult in progress, exercise, and carbohydrates information provided.  5)  hypothyroidism -I will continue  levothyroxine to 112 g by mouth every morning.  - We discussed about correct intake of levothyroxine, at fasting, with water, separated by at least 30 minutes from breakfast, and separated by more than 4 hours from calcium, iron, multivitamins, acid reflux medications (PPIs). -Patient is made aware of the fact that thyroid hormone replacement is needed for life, dose to be adjusted by periodic monitoring of thyroid function tests.   6) Chronic Care/Health Maintenance:  -Patient  is Statin medications and encouraged to continue to follow up with Ophthalmology, Podiatrist at least yearly or according to recommendations, and advised to quit smoking. I have recommended yearly flu vaccine and pneumonia vaccination at least every 5 years; moderate intensity exercise for up to 150 minutes weekly; and  sleep for at least 7 hours a day.  - Time spent with the patient: 25 min, of which >50% was spent in reviewing his sugar logs , discussing his hypo- and hyper-glycemic episodes, reviewing his current and  previous labs and insulin doses and developing a plan to avoid hypo- and hyper-glycemia.    - I advised patient to maintain close follow up with Sharilyn Sites, MD for primary care needs.  Patient is asked to bring meter and  blood glucose logs during his next visit.   Follow up plan: -Return in about 3 months (around 03/08/2017) for follow up  with pre-visit labs, meter, and logs.  Ralph Lloyd, MD Phone: [phone]  Fax: [phone]  This note was partially dictated with voice recognition software. Similar sounding words can be transcribed inadequately or may not  be corrected upon review.  12/06/2016, 3:29 PM

## 2016-12-08 ENCOUNTER — Encounter: Payer: Self-pay | Admitting: Internal Medicine

## 2016-12-08 ENCOUNTER — Ambulatory Visit (INDEPENDENT_AMBULATORY_CARE_PROVIDER_SITE_OTHER): Payer: Medicare HMO | Admitting: Internal Medicine

## 2016-12-08 VITALS — BP 110/68 | HR 64 | Ht 70.0 in | Wt 224.0 lb

## 2016-12-08 DIAGNOSIS — I495 Sick sinus syndrome: Secondary | ICD-10-CM

## 2016-12-08 DIAGNOSIS — I480 Paroxysmal atrial fibrillation: Secondary | ICD-10-CM

## 2016-12-08 DIAGNOSIS — I10 Essential (primary) hypertension: Secondary | ICD-10-CM | POA: Diagnosis not present

## 2016-12-08 LAB — CUP PACEART INCLINIC DEVICE CHECK
Battery Voltage: 2.9 V
Brady Statistic RA Percent Paced: 98 %
Implantable Lead Implant Date: 20130920
Implantable Lead Location: 753859
Lead Channel Impedance Value: 425 Ohm
Lead Channel Pacing Threshold Amplitude: 0.5 V
Lead Channel Pacing Threshold Amplitude: 0.5 V
Lead Channel Pacing Threshold Amplitude: 1.25 V
Lead Channel Pacing Threshold Pulse Width: 0.4 ms
Lead Channel Pacing Threshold Pulse Width: 1 ms
Lead Channel Sensing Intrinsic Amplitude: 1.4 mV
Lead Channel Sensing Intrinsic Amplitude: 1.8 mV
Lead Channel Setting Pacing Amplitude: 2 V
Lead Channel Setting Pacing Amplitude: 2.5 V
Lead Channel Setting Pacing Pulse Width: 1 ms
MDC IDC LEAD IMPLANT DT: 20130920
MDC IDC LEAD LOCATION: 753860
MDC IDC MSMT BATTERY REMAINING LONGEVITY: 80 mo
MDC IDC MSMT LEADCHNL RA PACING THRESHOLD PULSEWIDTH: 0.4 ms
MDC IDC MSMT LEADCHNL RV IMPEDANCE VALUE: 375 Ohm
MDC IDC MSMT LEADCHNL RV PACING THRESHOLD AMPLITUDE: 1.25 V
MDC IDC MSMT LEADCHNL RV PACING THRESHOLD PULSEWIDTH: 1 ms
MDC IDC PG IMPLANT DT: 20130920
MDC IDC PG SERIAL: 7393982
MDC IDC SESS DTM: 20180830131215
MDC IDC SET LEADCHNL RV SENSING SENSITIVITY: 1 mV
MDC IDC STAT BRADY RV PERCENT PACED: 0.38 %
Pulse Gen Model: 2210

## 2016-12-08 NOTE — Progress Notes (Signed)
HPI Ralph [name] returns today for ongoing follow-up of sinus node dysfunction and paroxysmal atrial fibrillation, status post permanent pacemaker insertion. He also has a history of coronary artery disease and peripheral vascular disease. He injured his leg several months ago and has just recently had complete healing of the skin on the inside of his left leg. Allergies  Allergen Reactions  . Nitroglycerin Hives, Swelling and Rash     Current Outpatient Prescriptions  Medication Sig Dispense Refill  . Apremilast (OTEZLA) 30 MG TABS Take 30 mg by mouth 2 (two) times daily.    Marland Kitchen buPROPion (WELLBUTRIN SR) 150 MG 12 hr tablet Take 150 mg by mouth 2 (two) times daily.    . Cholecalciferol (VITAMIN D PO) Take 1 tablet by mouth daily.    . Cyanocobalamin (VITAMIN B 12 PO) Take 1 tablet by mouth daily.    . dapagliflozin propanediol (FARXIGA) 10 MG TABS tablet Take 10 mg by mouth daily. 30 tablet 3  . diazepam (VALIUM) 10 MG tablet Take 10 mg by mouth every 6 (six) hours as needed for anxiety.    Marland Kitchen diltiazem (CARDIZEM CD) 180 MG 24 hr capsule TAKE ONE CAPSULE BY MOUTH EVERY DAY 90 capsule 2  . escitalopram (LEXAPRO) 10 MG tablet Take 10 mg by mouth daily.    . flecainide (TAMBOCOR) 100 MG tablet TAKE 1 TABLET BY MOUTH TWICE A DAY 180 tablet 1  . furosemide (LASIX) 20 MG tablet Take 2 tablets (40 mg total) by mouth daily as needed (swelling). 180 tablet 3  . HYDROcodone-acetaminophen (NORCO/VICODIN) 5-325 MG tablet Take 2 tablets by mouth every 4 (four) hours as needed. 10 tablet 0  . Insulin Detemir (LEVEMIR FLEXTOUCH) 100 UNIT/ML Pen Inject 50 Units into the skin at bedtime. 30 mL 2  . metFORMIN (GLUCOPHAGE) 1000 MG tablet TAKE 1 TABLET BY MOUTH TWICE A DAY 180 tablet 0  . Multiple Vitamin (MULTIVITAMIN) tablet Take 1 tablet by mouth daily.    . nadolol (CORGARD) 40 MG tablet TAKE 1 TABLET IN THE MORNING AND 1/2 TABLET BY MOUTH EVERY EVENING 135 tablet 2  . Omega-3 Fatty Acids (FISH OIL)  1200 MG CAPS Take by mouth 2 (two) times daily.    . ondansetron (ZOFRAN) 4 MG tablet Take 1 tablet (4 mg total) by mouth every 8 (eight) hours as needed for nausea or vomiting. 40 tablet 0  . ONETOUCH VERIO test strip USE TO TEST BLOOD SUGAR FOUR TIMES A DAY 150 each 5  . OXYGEN Inhale 2 L into the lungs at bedtime.    . pantoprazole (PROTONIX) 40 MG tablet TAKE 1 TABLET (40 MG TOTAL) BY MOUTH DAILY. 90 tablet 4  . simvastatin (ZOCOR) 20 MG tablet TAKE 1 TABLET (20 MG TOTAL) BY MOUTH EVERY EVENING. 90 tablet 3  . spironolactone (ALDACTONE) 50 MG tablet TAKE 1 TABLET (50 MG TOTAL) BY MOUTH 2 (TWO) TIMES DAILY. 180 tablet 2  . SYNTHROID 112 MCG tablet TAKE 1 TABLET BY MOUTH EVERY MORNING BEFORE BREAKFAST 90 tablet 0  . traMADol (ULTRAM) 50 MG tablet Take 50 mg by mouth every 6 (six) hours as needed.    Alveda Reasons 20 MG TABS tablet TAKE 1 TABLET BY MOUTH EVERY DAY WITH DINNER 90 tablet 2   No current facility-administered medications for this visit.      Past Medical History:  Diagnosis Date  . Anginal pain ([place])   . Arthritis    "back; fingers" (01/06/2012)  . Atrial flutter (  [place])    s/p EPS +RF ablation of typical atrial flutter April 2015  . Cancer ([place])    skin cancer  . CHB (complete heart block) ([place])   . CHF (congestive heart failure) ([place]) 01/06/2012  . Chronic lower back pain   . 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 Coughing up blood    "comes from my throat" (01/06/2012)  . DDD (degenerative disc disease), lumbar   . Depressed   . Difficult intubation    Eschmann stylet used in 2002 and 2007; "trouble waking up afterwards" (01/06/2012)  . Emphysema   . Fatty liver disease, nonalcoholic   . GERD (gastroesophageal reflux disease)   . H/O hiatal hernia   . Headache   . Hypertension   . Hypothyroidism   . Pacemaker   . Pneumonia Aug 2016  . Presence of permanent cardiac pacemaker 9/292013   St.Jude  . Sinus pause 01/06/2012   5.2 seconds  . Sleep apnea     "don't wear mask" (01/06/2012)  . Stroke [facility])    pt states that he might have had a stroke not sure  . Type II diabetes mellitus ([place])   . Varicose vein    of esophagus    ROS:   All systems reviewed and negative except as noted in the HPI.   Past Surgical History:  Procedure Laterality Date  . ATRIAL FLUTTER ABLATION N/A 07/10/2013   Procedure: ATRIAL FLUTTER ABLATION;  Surgeon: Evans Lance, MD;  Location: [facility] CATH LAB;  Service: Cardiovascular;  Laterality: N/A;  . BACK SURGERY    . CHOLECYSTECTOMY  1993  . COLONOSCOPY  11/08/2004   QMV:[id number] rectum, colon, TI.  Marland Kitchen COLONOSCOPY N/A 05/28/2014   Dr. Gala Romney: Redundant colon. single colonic polyp removed as described above. Tubular adenoma  . ESOPHAGEAL DILATION N/A 05/28/2014   Procedure: ESOPHAGEAL DILATION;  Surgeon: Daneil Dolin, MD;  [facility] ENDO [facility];  Service: Endoscopy;  Laterality: N/A;  . ESOPHAGOGASTRODUODENOSCOPY  11/08/2004   EXB:[id number] esophageal erosions consistent with erosive reflux esophagitis/Areas of hemorrhage and nodularity of the fundal mucosa of uncertain significance, biopsied.  Small hiatal hernia, otherwise normal stomach  . ESOPHAGOGASTRODUODENOSCOPY  2010   Dr. Gala Romney: 3 columns Grade 1 varices, erosive esophagitis, HH, portal gastropathy, normal D1, D2  . ESOPHAGOGASTRODUODENOSCOPY N/A 05/28/2014   Dr. Gala Romney: MIld erosive reflux esophagitis. Grade 1 esophageal varices. Patent esophagus. No dilation performed. Hiatal hernia.   Marland Kitchen ESOPHAGOGASTRODUODENOSCOPY (EGD) WITH ESOPHAGEAL DILATION N/A 02/14/2013   GMW:NUUVO 1 esophageal varices. Abnormal distal esophagus/status post biopsy after Maloney dilation. Portal gastropathy. Antral erosions-status post biopsy. path negative for H.pylori, benign path.  . LUMBAR [place]; ~ 1995; ~ 1996  . NASAL SEPTUM SURGERY  1992  . nuclear stress test  10/19/2004   No ischemia  . PERMANENT PACEMAKER INSERTION  01/08/2012   CHB  . PERMANENT PACEMAKER INSERTION  N/A 01/09/2012   Procedure: PERMANENT PACEMAKER INSERTION;  Surgeon: Sanda Klein, MD;  Location: [place] CATH LAB;  Service: Cardiovascular;  Laterality: N/A;  . POSTERIOR FUSION LUMBAR SPINE  1999   L4-5  . SPINAL CORD STIMULATOR IMPLANT  2006  . SPINAL CORD STIMULATOR REMOVAL N/A 01/27/2015   Procedure: LUMBAR SPINAL CORD STIMULATOR REMOVAL;  Surgeon: Kristeen Miss, MD;  Location: [place] NEURO ORS;  Service: Neurosurgery;  Laterality: N/A;  LUMBAR SPINAL CORD STIMULATOR REMOVAL  . TONSILLECTOMY AND ADENOIDECTOMY  1992  . US ECHOCARDIOGRAPHY  12/28/2011   mild LVH,mild mitral  annulara ca+,mild MR  . 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 Cancer Mother        Deceased, 44  . Ovarian cancer Mother   . Arrhythmia Father   . Other Father        Deceased 44  . Stroke Brother   . Stroke Brother   . Stroke Sister   . Crohn's disease Daughter      Social History   Social History  . Marital status: Married    Spouse name: N/A  . Number of children: N/A  . Years of education: N/A   Occupational History  . Not on file.   Social History Main Topics  . Smoking status: Current Every Day Smoker    Packs/day: 0.25    Years: 45.00    Types: Cigarettes    Start date: 04/11/1968    Last attempt to quit: 04/12/2015  . Smokeless tobacco: Never Used     Comment: Quit x 8 months this time  . Alcohol use No     Comment: "quit alcohol 2011" Previously drinking socially about twice per month  . Drug use: No  . Sexual activity: No   Other Topics Concern  . Not on file   Social History Narrative   Lives with wife in a one story home.  Has 3 children.     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     Education: some college.     BP 110/68   Pulse 64   Ht 5\' 10"  (1.778 m)   Wt 224 lb (101.6 kg)   SpO2 95%   BMI 32.14 kg/m   Physical Exam:  Well appearing 65 year old man,NAD HEENT: Unremarkable Neck:  No JVD, no  thyromegally Lymphatics:  No adenopathy Back:  No CVA tenderness Lungs:  Clear, with no wheezes, rales, or rhonchi. Breath sounds are reduced slightly throughout. No increased work of breathing. HEART:  Regular rate rhythm, no murmurs, no rubs, no clicks Abd:  soft, positive bowel sounds, no organomegally, no rebound, no guarding Ext:  2 plus pulses, no edema, no cyanosis, no clubbing Skin:  No rashes no nodules Neuro:  CN II through XII intact, motor grossly intact   DEVICE  Normal device function.  See PaceArt for details.   Assess/Plan: 1. Sinus node dysfunction - he is asymptomatic status post permanent pacemaker insertion 2. Paroxysmal atrial fibrillation - he is maintaining sinus rhythm very nicely. No change in medical therapy 3. Hypertension - his blood pressure is well controlled today. He will continue his calcium channel blocker and beta blocker. 4. Pacemaker his St. Jude dual-chamber pacemaker is working normally. We'll plan to recheck in several months. \ Ralph [name], [name]

## 2016-12-08 NOTE — Patient Instructions (Signed)
Medication Instructions:  Your physician recommends that you continue on your current medications as directed. Please refer to the Current Medication list given to you today.   Labwork: NONE  Testing/Procedures: NONE  Follow-Up: Your physician wants you to follow-up in: 1 Year with Dr. Lovena Le. You will receive a reminder letter in the mail two months in advance. If you don't receive a letter, please call our office to schedule the follow-up appointment.  Remote monitoring is used to monitor your Pacemaker of ICD from home. This monitoring reduces the number of office visits required to check your device to one time per year. It allows [place] to keep an eye on the functioning of your device to ensure it is working properly. You are scheduled for a device check from home on 03/09/17. You may send your transmission at any time that day. If you have a wireless device, the transmission will be sent automatically. After your physician reviews your transmission, you will receive a postcard with your next transmission date.   Any Other Special Instructions Will Be Listed Below (If Applicable).     If you need a refill on your cardiac medications before your next appointment, please call your pharmacy.

## 2016-12-29 ENCOUNTER — Ambulatory Visit (INDEPENDENT_AMBULATORY_CARE_PROVIDER_SITE_OTHER): Payer: Medicare HMO | Admitting: Cardiology

## 2016-12-29 ENCOUNTER — Encounter: Payer: Self-pay | Admitting: Cardiology

## 2016-12-29 VITALS — BP 128/76 | HR 77 | Ht 70.0 in | Wt 229.0 lb

## 2016-12-29 DIAGNOSIS — I442 Atrioventricular block, complete: Secondary | ICD-10-CM

## 2016-12-29 DIAGNOSIS — I10 Essential (primary) hypertension: Secondary | ICD-10-CM | POA: Diagnosis not present

## 2016-12-29 DIAGNOSIS — I4891 Unspecified atrial fibrillation: Secondary | ICD-10-CM | POA: Diagnosis not present

## 2016-12-29 DIAGNOSIS — I495 Sick sinus syndrome: Secondary | ICD-10-CM | POA: Diagnosis not present

## 2016-12-29 DIAGNOSIS — R69 Illness, unspecified: Secondary | ICD-10-CM | POA: Diagnosis not present

## 2016-12-29 NOTE — Patient Instructions (Signed)
Medication Instructions:  Your physician recommends that you continue on your current medications as directed. Please refer to the Current Medication list given to you today.   Labwork: NONE   Testing/Procedures: NONE  Follow-Up: Your physician wants you to follow-up in: 6 Months with Dr. Branch. You will receive a reminder letter in the mail two months in advance. If you don't receive a letter, please call our office to schedule the follow-up appointment.   Any Other Special Instructions Will Be Listed Below (If Applicable).     If you need a refill on your cardiac medications before your next appointment, please call your pharmacy. Thank you for choosing [facility]!    

## 2016-12-29 NOTE — Progress Notes (Signed)
Clinical Summary Ralph [name] is a 66 y.o.male seen today for follow up of the following medical problems.  1. Bradycardia/sinus arrest  - St Jude dual chamber pacemaker pacemaker implanted Sept 2013 ([place] [facility]).   - normal device function 12/08/16. No recent symptoms   2. Afib/aflutter - previous side effects on multaq - seen by EP 06/28/13, started on flecanide. Continued to have symptoms. Had RF ablation of flutter by Dr Lovena Le 07/10/13. 10/2014 device check did show some AF burden - on nadolol for history of palpitations as well as esoph varices   - denies any palpitations. No bleeding troubles on anticoagulation   3. HTN   - home bp's 110/70s - compliant with meds  4. HL  - 12/2015 TC 111 TG 276 HDL 30 LDL 26 - he remains compliant with statin  5. NASH cirrhosis  - followed by GI  - history of grade I varices, no history of bleeding on anticoag    6. COPD  - compliant with inhalers and home oxygen. - followed by pulmonary  7. Orthostatic dizziness - he denies any recent symptoms  8. DM2 - followed Dr Dorris Fetch  9. Obesity - recent 12 lbs gain.   Past Medical History:  Diagnosis Date  . Anginal pain ([place])   . Arthritis    "back; fingers" (01/06/2012)  . Atrial flutter [facility])    s/p EPS +RF ablation of typical atrial flutter April 2015  . Cancer ([place])    skin cancer  . CHB (complete heart block) ([place])   . CHF (congestive heart failure) ([place]) 01/06/2012  . Chronic lower back pain   . 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 Coughing up blood    "comes from my throat" (01/06/2012)  . DDD (degenerative disc disease), lumbar   . Depressed   . Difficult intubation    Eschmann stylet used in 2002 and 2007; "trouble waking up afterwards" (01/06/2012)  . Emphysema   . Fatty liver disease, nonalcoholic   . GERD (gastroesophageal reflux disease)   . H/O hiatal hernia   . Headache   . Hypertension   . Hypothyroidism   .  Pacemaker   . Pneumonia Aug 2016  . Presence of permanent cardiac pacemaker 9/292013   St.Jude  . Sinus pause 01/06/2012   5.2 seconds  . Sleep apnea    "don't wear mask" (01/06/2012)  . Stroke [facility])    pt states that he might have had a stroke not sure  . Type II diabetes mellitus ([place])   . Varicose vein    of esophagus     Allergies  Allergen Reactions  . Nitroglycerin Hives, Swelling and Rash     Current Outpatient Prescriptions  Medication Sig Dispense Refill  . Apremilast (OTEZLA) 30 MG TABS Take 30 mg by mouth 2 (two) times daily.    Marland Kitchen buPROPion (WELLBUTRIN SR) 150 MG 12 hr tablet Take 150 mg by mouth 2 (two) times daily.    . Cholecalciferol (VITAMIN D PO) Take 1 tablet by mouth daily.    . Cyanocobalamin (VITAMIN B 12 PO) Take 1 tablet by mouth daily.    . dapagliflozin propanediol (FARXIGA) 10 MG TABS tablet Take 10 mg by mouth daily. 30 tablet 3  . diazepam (VALIUM) 10 MG tablet Take 10 mg by mouth every 6 (six) hours as needed for anxiety.    Marland Kitchen diltiazem (CARDIZEM CD) 180 MG 24 hr capsule TAKE ONE  CAPSULE BY MOUTH EVERY DAY 90 capsule 2  . escitalopram (LEXAPRO) 10 MG tablet Take 10 mg by mouth daily.    . flecainide (TAMBOCOR) 100 MG tablet TAKE 1 TABLET BY MOUTH TWICE A DAY 180 tablet 1  . furosemide (LASIX) 20 MG tablet Take 2 tablets (40 mg total) by mouth daily as needed (swelling). 180 tablet 3  . HYDROcodone-acetaminophen (NORCO/VICODIN) 5-325 MG tablet Take 2 tablets by mouth every 4 (four) hours as needed. 10 tablet 0  . Insulin Detemir (LEVEMIR FLEXTOUCH) 100 UNIT/ML Pen Inject 50 Units into the skin at bedtime. 30 mL 2  . metFORMIN (GLUCOPHAGE) 1000 MG tablet TAKE 1 TABLET BY MOUTH TWICE A DAY 180 tablet 0  . Multiple Vitamin (MULTIVITAMIN) tablet Take 1 tablet by mouth daily.    . nadolol (CORGARD) 40 MG tablet TAKE 1 TABLET IN THE MORNING AND 1/2 TABLET BY MOUTH EVERY EVENING 135 tablet 2  . Omega-3 Fatty Acids (FISH OIL) 1200 MG CAPS Take by mouth 2 (two)  times daily.    . ondansetron (ZOFRAN) 4 MG tablet Take 1 tablet (4 mg total) by mouth every 8 (eight) hours as needed for nausea or vomiting. 40 tablet 0  . ONETOUCH VERIO test strip USE TO TEST BLOOD SUGAR FOUR TIMES A DAY 150 each 5  . OXYGEN Inhale 2 L into the lungs at bedtime.    . pantoprazole (PROTONIX) 40 MG tablet TAKE 1 TABLET (40 MG TOTAL) BY MOUTH DAILY. 90 tablet 4  . simvastatin (ZOCOR) 20 MG tablet TAKE 1 TABLET (20 MG TOTAL) BY MOUTH EVERY EVENING. 90 tablet 3  . spironolactone (ALDACTONE) 50 MG tablet TAKE 1 TABLET (50 MG TOTAL) BY MOUTH 2 (TWO) TIMES DAILY. 180 tablet 2  . SYNTHROID 112 MCG tablet TAKE 1 TABLET BY MOUTH EVERY MORNING BEFORE BREAKFAST 90 tablet 0  . traMADol (ULTRAM) 50 MG tablet Take 50 mg by mouth every 6 (six) hours as needed.    Alveda Reasons 20 MG TABS tablet TAKE 1 TABLET BY MOUTH EVERY DAY WITH DINNER 90 tablet 2   No current facility-administered medications for this visit.      Past Surgical History:  Procedure Laterality Date  . ATRIAL FLUTTER ABLATION N/A 07/10/2013   Procedure: ATRIAL FLUTTER ABLATION;  Surgeon: Evans Lance, MD;  Location: [facility] CATH LAB;  Service: Cardiovascular;  Laterality: N/A;  . BACK SURGERY    . CHOLECYSTECTOMY  1993  . COLONOSCOPY  11/08/2004   [id number] rectum, colon, TI.  Marland Kitchen COLONOSCOPY N/A 05/28/2014   Dr. Gala Romney: Redundant colon. single colonic polyp removed as described above. Tubular adenoma  . ESOPHAGEAL DILATION N/A 05/28/2014   Procedure: ESOPHAGEAL DILATION;  Surgeon: Daneil Dolin, MD;  [facility] ENDO [facility];  Service: Endoscopy;  Laterality: N/A;  . ESOPHAGOGASTRODUODENOSCOPY  11/08/2004   GQQ:PYPPJK esophageal erosions consistent with erosive reflux esophagitis/Areas of hemorrhage and nodularity of the fundal mucosa of uncertain significance, biopsied.  Small hiatal hernia, otherwise normal stomach  . ESOPHAGOGASTRODUODENOSCOPY  2010   Dr. Gala Romney: 3 columns Grade 1 varices, erosive esophagitis, HH, portal  gastropathy, normal D1, D2  . ESOPHAGOGASTRODUODENOSCOPY N/A 05/28/2014   Dr. Gala Romney: MIld erosive reflux esophagitis. Grade 1 esophageal varices. Patent esophagus. No dilation performed. Hiatal hernia.   Marland Kitchen ESOPHAGOGASTRODUODENOSCOPY (EGD) WITH ESOPHAGEAL DILATION N/A 02/14/2013   DTO:IZTIW 1 esophageal varices. Abnormal distal esophagus/status post biopsy after Maloney dilation. Portal gastropathy. Antral erosions-status post biopsy. path negative for H.pylori, benign path.  . LUMBAR [place] SURGERY  1994; ~ 1995; ~ 1996  . NASAL SEPTUM SURGERY  1992  . nuclear stress test  10/19/2004   No ischemia  . PERMANENT PACEMAKER INSERTION  01/08/2012   CHB  . PERMANENT PACEMAKER INSERTION N/A 01/09/2012   Procedure: PERMANENT PACEMAKER INSERTION;  Surgeon: Sanda Klein, MD;  Location: [place] CATH LAB;  Service: Cardiovascular;  Laterality: N/A;  . POSTERIOR FUSION LUMBAR SPINE  1999   L4-5  . SPINAL CORD STIMULATOR IMPLANT  2006  . SPINAL CORD STIMULATOR REMOVAL N/A 01/27/2015   Procedure: LUMBAR SPINAL CORD STIMULATOR REMOVAL;  Surgeon: Kristeen Miss, MD;  Location: [place] NEURO ORS;  Service: Neurosurgery;  Laterality: N/A;  LUMBAR SPINAL CORD STIMULATOR REMOVAL  . TONSILLECTOMY AND ADENOIDECTOMY  1992  . US ECHOCARDIOGRAPHY  12/28/2011   mild LVH,mild mitral annulara ca+,mild MR  . Warthin's tumor excision  1990's   right     Allergies  Allergen Reactions  . Nitroglycerin Hives, Swelling and Rash      Family History  Problem Relation Age of Onset  . Cancer Mother        Deceased, 43  . Ovarian cancer Mother   . Arrhythmia Father   . Other Father        Deceased 42  . Stroke Brother   . Stroke Brother   . Stroke Sister   . Crohn's disease Daughter      Social History Mr. Harriger reports that he has been smoking Cigarettes.  He started smoking about 48 years ago. He has a 11.25 pack-year smoking history. He has never used smokeless tobacco. Mr. Johnston reports that he does not drink  alcohol.   Review of Systems CONSTITUTIONAL: No weight loss, fever, chills, weakness or fatigue.  HEENT: Eyes: No visual loss, blurred vision, double vision or yellow sclerae.No hearing loss, sneezing, congestion, runny nose or sore throat.  SKIN: No rash or itching.  CARDIOVASCULAR: per hpi RESPIRATORY: No shortness of breath, cough or sputum.  GASTROINTESTINAL: No anorexia, nausea, vomiting or diarrhea. No abdominal pain or blood.  GENITOURINARY: No burning on urination, no polyuria NEUROLOGICAL: No headache, dizziness, syncope, paralysis, ataxia, numbness or tingling in the extremities. No change in bowel or bladder control.  MUSCULOSKELETAL: No muscle, back pain, joint pain or stiffness.  LYMPHATICS: No enlarged nodes. No history of splenectomy.  PSYCHIATRIC: No history of depression or anxiety.  ENDOCRINOLOGIC: No reports of sweating, cold or heat intolerance. No polyuria or polydipsia.  Marland Kitchen   Physical Examination Vitals:   12/29/16 0824  BP: 128/76  Pulse: 77  SpO2: 93%   Filed Weights   12/29/16 0824  Weight: 229 lb (103.9 kg)    Gen: resting comfortably, no acute distress HEENT: no scleral icterus, pupils equal round and reactive, no palptable cervical adenopathy,  CV: RRR, no m/r/g, no jvd Resp: Clear to auscultation bilaterally GI: abdomen is soft, non-tender, non-distended, normal bowel sounds, no hepatosplenomegaly MSK: extremities are warm, no edema.  Skin: warm, no rash Neuro:  no focal deficits Psych: appropriate affect   Diagnostic Studies Jan 2014 Myoview: no ischemia   11/2012 Echo: LVEF 60-65%, mild LVH, moderate basal septal hypertrophy, mild LAE    Assessment and Plan   1. Afib/Aflutter - no recent symptoms, continue current meds - CHADS2Vasc score is 2, continue anticoagulation   2. HTN:  - bp is at goal, contin eucurrent meds  3. HL  - followed by PCP. Reports PCP is watching his LFTs closely due to an increase and history of  NASH,  will defer management to PCP   4. Sinus arrest  - no recent symptoms, continue to follow pacemaker in device clinic  5. COPD - per pulmonary  6. Orthostatic dizziness - no recent symptoms, continue to monitor   F/u 6 months   Arnoldo Lenis, [name], F.A.C.C.

## 2017-01-04 DIAGNOSIS — M47816 Spondylosis without myelopathy or radiculopathy, lumbar region: Secondary | ICD-10-CM | POA: Diagnosis not present

## 2017-01-04 DIAGNOSIS — M47892 Other spondylosis, cervical region: Secondary | ICD-10-CM | POA: Diagnosis not present

## 2017-01-04 DIAGNOSIS — M47894 Other spondylosis, thoracic region: Secondary | ICD-10-CM | POA: Diagnosis not present

## 2017-01-12 ENCOUNTER — Other Ambulatory Visit: Payer: Self-pay | Admitting: Nurse Practitioner

## 2017-01-16 ENCOUNTER — Telehealth: Payer: Self-pay | Admitting: "Endocrinology

## 2017-01-16 NOTE — Telephone Encounter (Signed)
Ralph [name] is asking for  A 90 day supply of Farxiga faxed to [facility] fax # [phone] with ID # [id number]

## 2017-01-18 MED ORDER — DAPAGLIFLOZIN PROPANEDIOL 10 MG PO TABS: 10.0000 mg | ORAL_TABLET | Freq: Every day | ORAL | 0 refills | Status: DC

## 2017-01-26 DIAGNOSIS — E1129 Type 2 diabetes mellitus with other diabetic kidney complication: Secondary | ICD-10-CM | POA: Diagnosis not present

## 2017-01-26 DIAGNOSIS — E109 Type 1 diabetes mellitus without complications: Secondary | ICD-10-CM | POA: Diagnosis not present

## 2017-01-26 DIAGNOSIS — H5200 Hypermetropia, unspecified eye: Secondary | ICD-10-CM | POA: Diagnosis not present

## 2017-01-28 ENCOUNTER — Other Ambulatory Visit: Payer: Self-pay | Admitting: Cardiology

## 2017-01-29 ENCOUNTER — Other Ambulatory Visit: Payer: Self-pay | Admitting: "Endocrinology

## 2017-01-31 DIAGNOSIS — R69 Illness, unspecified: Secondary | ICD-10-CM | POA: Diagnosis not present

## 2017-02-13 ENCOUNTER — Other Ambulatory Visit: Payer: Self-pay | Admitting: "Endocrinology

## 2017-02-13 ENCOUNTER — Other Ambulatory Visit: Payer: Self-pay | Admitting: Cardiology

## 2017-02-26 ENCOUNTER — Other Ambulatory Visit: Payer: Self-pay | Admitting: "Endocrinology

## 2017-03-03 DIAGNOSIS — R69 Illness, unspecified: Secondary | ICD-10-CM | POA: Diagnosis not present

## 2017-03-08 ENCOUNTER — Ambulatory Visit: Payer: Medicare HMO | Admitting: "Endocrinology

## 2017-03-09 ENCOUNTER — Ambulatory Visit (INDEPENDENT_AMBULATORY_CARE_PROVIDER_SITE_OTHER): Payer: Medicare HMO | Admitting: *Deleted

## 2017-03-09 DIAGNOSIS — I495 Sick sinus syndrome: Secondary | ICD-10-CM | POA: Diagnosis not present

## 2017-03-09 NOTE — Progress Notes (Signed)
Remote pacemaker transmission.   

## 2017-03-10 ENCOUNTER — Encounter: Payer: Self-pay | Admitting: Cardiology

## 2017-03-21 ENCOUNTER — Other Ambulatory Visit: Payer: Self-pay | Admitting: Internal Medicine

## 2017-03-21 LAB — CUP PACEART REMOTE DEVICE CHECK
Battery Voltage: 2.9 V
Brady Statistic AP VP Percent: 1 %
Brady Statistic AP VS Percent: 98 %
Brady Statistic RA Percent Paced: 98 %
Implantable Lead Implant Date: 20130920
Implantable Lead Location: 753859
Implantable Pulse Generator Implant Date: 20130920
Lead Channel Impedance Value: 440 Ohm
Lead Channel Pacing Threshold Amplitude: 0.5 V
Lead Channel Pacing Threshold Amplitude: 1.25 V
Lead Channel Pacing Threshold Pulse Width: 0.4 ms
Lead Channel Setting Sensing Sensitivity: 1 mV
MDC IDC LEAD IMPLANT DT: 20130920
MDC IDC LEAD LOCATION: 753860
MDC IDC MSMT BATTERY REMAINING LONGEVITY: 70 mo
MDC IDC MSMT BATTERY REMAINING PERCENTAGE: 65 %
MDC IDC MSMT LEADCHNL RA IMPEDANCE VALUE: 450 Ohm
MDC IDC MSMT LEADCHNL RA SENSING INTR AMPL: 1.6 mV
MDC IDC MSMT LEADCHNL RV PACING THRESHOLD PULSEWIDTH: 1 ms
MDC IDC MSMT LEADCHNL RV SENSING INTR AMPL: 2.5 mV
MDC IDC PG SERIAL: 7393982
MDC IDC SESS DTM: 20181129121232
MDC IDC SET LEADCHNL RA PACING AMPLITUDE: 2 V
MDC IDC SET LEADCHNL RV PACING AMPLITUDE: 2.5 V
MDC IDC SET LEADCHNL RV PACING PULSEWIDTH: 1 ms
MDC IDC STAT BRADY AS VP PERCENT: 1 %
MDC IDC STAT BRADY AS VS PERCENT: 1.5 %
MDC IDC STAT BRADY RV PERCENT PACED: 1 %
Pulse Gen Model: 2210

## 2017-04-05 DIAGNOSIS — R69 Illness, unspecified: Secondary | ICD-10-CM | POA: Diagnosis not present

## 2017-04-23 ENCOUNTER — Other Ambulatory Visit: Payer: Self-pay | Admitting: "Endocrinology

## 2017-05-08 DIAGNOSIS — Z1389 Encounter for screening for other disorder: Secondary | ICD-10-CM | POA: Diagnosis not present

## 2017-05-08 DIAGNOSIS — E6609 Other obesity due to excess calories: Secondary | ICD-10-CM | POA: Diagnosis not present

## 2017-05-08 DIAGNOSIS — L03119 Cellulitis of unspecified part of limb: Secondary | ICD-10-CM | POA: Diagnosis not present

## 2017-05-08 DIAGNOSIS — L089 Local infection of the skin and subcutaneous tissue, unspecified: Secondary | ICD-10-CM | POA: Diagnosis not present

## 2017-05-08 DIAGNOSIS — Z6836 Body mass index (BMI) 36.0-36.9, adult: Secondary | ICD-10-CM | POA: Diagnosis not present

## 2017-05-09 ENCOUNTER — Other Ambulatory Visit: Payer: Self-pay | Admitting: Cardiology

## 2017-05-09 DIAGNOSIS — L03119 Cellulitis of unspecified part of limb: Secondary | ICD-10-CM | POA: Diagnosis not present

## 2017-05-09 DIAGNOSIS — E6609 Other obesity due to excess calories: Secondary | ICD-10-CM | POA: Diagnosis not present

## 2017-05-09 DIAGNOSIS — R69 Illness, unspecified: Secondary | ICD-10-CM | POA: Diagnosis not present

## 2017-05-09 DIAGNOSIS — L089 Local infection of the skin and subcutaneous tissue, unspecified: Secondary | ICD-10-CM | POA: Diagnosis not present

## 2017-05-09 DIAGNOSIS — Z6836 Body mass index (BMI) 36.0-36.9, adult: Secondary | ICD-10-CM | POA: Diagnosis not present

## 2017-05-16 DIAGNOSIS — R69 Illness, unspecified: Secondary | ICD-10-CM | POA: Diagnosis not present

## 2017-05-16 DIAGNOSIS — Z125 Encounter for screening for malignant neoplasm of prostate: Secondary | ICD-10-CM | POA: Diagnosis not present

## 2017-05-16 DIAGNOSIS — J449 Chronic obstructive pulmonary disease, unspecified: Secondary | ICD-10-CM | POA: Diagnosis not present

## 2017-05-16 DIAGNOSIS — Z0001 Encounter for general adult medical examination with abnormal findings: Secondary | ICD-10-CM | POA: Diagnosis not present

## 2017-05-16 DIAGNOSIS — L03119 Cellulitis of unspecified part of limb: Secondary | ICD-10-CM | POA: Diagnosis not present

## 2017-05-16 DIAGNOSIS — E1129 Type 2 diabetes mellitus with other diabetic kidney complication: Secondary | ICD-10-CM | POA: Diagnosis not present

## 2017-05-16 DIAGNOSIS — Z1389 Encounter for screening for other disorder: Secondary | ICD-10-CM | POA: Diagnosis not present

## 2017-05-16 DIAGNOSIS — I5031 Acute diastolic (congestive) heart failure: Secondary | ICD-10-CM | POA: Diagnosis not present

## 2017-05-16 DIAGNOSIS — Z6835 Body mass index (BMI) 35.0-35.9, adult: Secondary | ICD-10-CM | POA: Diagnosis not present

## 2017-05-16 DIAGNOSIS — E039 Hypothyroidism, unspecified: Secondary | ICD-10-CM | POA: Diagnosis not present

## 2017-05-16 DIAGNOSIS — N182 Chronic kidney disease, stage 2 (mild): Secondary | ICD-10-CM | POA: Diagnosis not present

## 2017-05-16 DIAGNOSIS — I4891 Unspecified atrial fibrillation: Secondary | ICD-10-CM | POA: Diagnosis not present

## 2017-05-16 DIAGNOSIS — E782 Mixed hyperlipidemia: Secondary | ICD-10-CM | POA: Diagnosis not present

## 2017-05-16 DIAGNOSIS — I10 Essential (primary) hypertension: Secondary | ICD-10-CM | POA: Diagnosis not present

## 2017-06-06 ENCOUNTER — Other Ambulatory Visit: Payer: Self-pay | Admitting: Gastroenterology

## 2017-06-06 ENCOUNTER — Other Ambulatory Visit: Payer: Self-pay | Admitting: "Endocrinology

## 2017-06-06 ENCOUNTER — Other Ambulatory Visit: Payer: Self-pay | Admitting: Cardiology

## 2017-06-08 ENCOUNTER — Ambulatory Visit (INDEPENDENT_AMBULATORY_CARE_PROVIDER_SITE_OTHER): Payer: Medicare HMO | Admitting: *Deleted

## 2017-06-08 DIAGNOSIS — I495 Sick sinus syndrome: Secondary | ICD-10-CM | POA: Diagnosis not present

## 2017-06-09 ENCOUNTER — Encounter: Payer: Self-pay | Admitting: Cardiology

## 2017-06-09 ENCOUNTER — Other Ambulatory Visit: Payer: Self-pay | Admitting: "Endocrinology

## 2017-06-09 NOTE — Progress Notes (Signed)
Remote pacemaker transmission.   

## 2017-06-13 ENCOUNTER — Other Ambulatory Visit: Payer: Self-pay | Admitting: Gastroenterology

## 2017-06-19 ENCOUNTER — Telehealth: Payer: Self-pay | Admitting: *Deleted

## 2017-06-19 DIAGNOSIS — K7460 Unspecified cirrhosis of liver: Secondary | ICD-10-CM

## 2017-06-19 NOTE — Telephone Encounter (Signed)
Received fax from pharmacy stating nadolool is $300. Requesting alternative. The suggested alternatives are metoprolol 50mg , or atenolol 50 mg.

## 2017-06-20 NOTE — Telephone Encounter (Signed)
Spoke with [facility] and I wasn't able to get the exact cost of Propranolol.  Pharmacist said it is usually less expensive.

## 2017-06-20 NOTE — Telephone Encounter (Signed)
They're making recommendations based on hypertension management. This is for portal hypertension and needs a non-selective beta blocker. The other alternative we can try is Propranolol. It may be worth calling the pharmacy to find out the cost of that before sending in an Rx.  Sending to AM in MS absence.

## 2017-06-21 MED ORDER — PROPRANOLOL HCL 20 MG PO TABS: 20.0000 mg | ORAL_TABLET | Freq: Two times a day (BID) | ORAL | 3 refills | Status: DC

## 2017-06-21 NOTE — Telephone Encounter (Signed)
Ok, thanks for checking. I'll send in an Rx for propranolol to the pharmacy. Please notify the patient. Call us if any more pricing issues.

## 2017-06-21 NOTE — Telephone Encounter (Signed)
Noted, lmom, pt notified.

## 2017-06-25 LAB — CUP PACEART REMOTE DEVICE CHECK
Battery Remaining Longevity: 70 mo
Battery Voltage: 2.9 V
Brady Statistic AP VP Percent: 1 %
Brady Statistic AS VS Percent: 1.4 %
Brady Statistic RA Percent Paced: 98 %
Implantable Lead Implant Date: 20130920
Implantable Lead Implant Date: 20130920
Implantable Lead Location: 753859
Implantable Pulse Generator Implant Date: 20130920
Lead Channel Impedance Value: 440 Ohm
Lead Channel Pacing Threshold Amplitude: 0.5 V
Lead Channel Pacing Threshold Pulse Width: 0.4 ms
Lead Channel Pacing Threshold Pulse Width: 1 ms
Lead Channel Sensing Intrinsic Amplitude: 1.5 mV
Lead Channel Setting Pacing Amplitude: 2.5 V
MDC IDC LEAD LOCATION: 753860
MDC IDC MSMT BATTERY REMAINING PERCENTAGE: 65 %
MDC IDC MSMT LEADCHNL RV IMPEDANCE VALUE: 440 Ohm
MDC IDC MSMT LEADCHNL RV PACING THRESHOLD AMPLITUDE: 1.25 V
MDC IDC MSMT LEADCHNL RV SENSING INTR AMPL: 3.1 mV
MDC IDC PG SERIAL: 7393982
MDC IDC SESS DTM: 20190228135131
MDC IDC SET LEADCHNL RA PACING AMPLITUDE: 2 V
MDC IDC SET LEADCHNL RV PACING PULSEWIDTH: 1 ms
MDC IDC SET LEADCHNL RV SENSING SENSITIVITY: 1 mV
MDC IDC STAT BRADY AP VS PERCENT: 98 %
MDC IDC STAT BRADY AS VP PERCENT: 1 %
MDC IDC STAT BRADY RV PERCENT PACED: 1 %

## 2017-06-28 NOTE — Telephone Encounter (Signed)
PA received for Nadolol. PA not done due to pt being able to get RX Propranolol, which was called in by [facility].

## 2017-07-10 ENCOUNTER — Other Ambulatory Visit: Payer: Self-pay | Admitting: "Endocrinology

## 2017-08-19 ENCOUNTER — Other Ambulatory Visit: Payer: Self-pay | Admitting: Nurse Practitioner

## 2017-08-19 DIAGNOSIS — K7460 Unspecified cirrhosis of liver: Secondary | ICD-10-CM

## 2017-09-07 ENCOUNTER — Ambulatory Visit (INDEPENDENT_AMBULATORY_CARE_PROVIDER_SITE_OTHER): Payer: Medicare HMO | Admitting: *Deleted

## 2017-09-07 DIAGNOSIS — I442 Atrioventricular block, complete: Secondary | ICD-10-CM | POA: Diagnosis not present

## 2017-09-07 NOTE — Progress Notes (Signed)
Remote pacemaker transmission.   

## 2017-09-11 ENCOUNTER — Encounter: Payer: Self-pay | Admitting: Cardiology

## 2017-09-27 LAB — CUP PACEART REMOTE DEVICE CHECK
Brady Statistic AP VP Percent: 1 %
Brady Statistic AS VP Percent: 1 %
Brady Statistic RA Percent Paced: 90 %
Date Time Interrogation Session: 20190530071357
Implantable Lead Implant Date: 20130920
Implantable Lead Location: 753859
Implantable Lead Location: 753860
Lead Channel Impedance Value: 410 Ohm
Lead Channel Impedance Value: 460 Ohm
Lead Channel Pacing Threshold Amplitude: 0.5 V
Lead Channel Pacing Threshold Pulse Width: 0.4 ms
Lead Channel Pacing Threshold Pulse Width: 1 ms
Lead Channel Sensing Intrinsic Amplitude: 1.9 mV
Lead Channel Sensing Intrinsic Amplitude: 3 mV
Lead Channel Setting Pacing Pulse Width: 1 ms
Lead Channel Setting Sensing Sensitivity: 1 mV
MDC IDC LEAD IMPLANT DT: 20130920
MDC IDC MSMT BATTERY REMAINING LONGEVITY: 70 mo
MDC IDC MSMT BATTERY REMAINING PERCENTAGE: 65 %
MDC IDC MSMT BATTERY VOLTAGE: 2.9 V
MDC IDC MSMT LEADCHNL RV PACING THRESHOLD AMPLITUDE: 1.25 V
MDC IDC PG IMPLANT DT: 20130920
MDC IDC PG SERIAL: 7393982
MDC IDC SET LEADCHNL RA PACING AMPLITUDE: 2 V
MDC IDC SET LEADCHNL RV PACING AMPLITUDE: 2.5 V
MDC IDC STAT BRADY AP VS PERCENT: 90 %
MDC IDC STAT BRADY AS VS PERCENT: 9.5 %
MDC IDC STAT BRADY RV PERCENT PACED: 1 %

## 2017-11-05 ENCOUNTER — Other Ambulatory Visit: Payer: Self-pay | Admitting: Cardiology

## 2017-11-20 ENCOUNTER — Other Ambulatory Visit: Payer: Self-pay | Admitting: Cardiology

## 2017-11-29 DIAGNOSIS — R69 Illness, unspecified: Secondary | ICD-10-CM | POA: Diagnosis not present

## 2017-12-02 ENCOUNTER — Emergency Department (HOSPITAL_COMMUNITY)
Admission: EM | Admit: 2017-12-02 | Discharge: 2017-12-02 | Disposition: A | Discharge status: Home / Self Care | Payer: Medicare HMO | Attending: Emergency Medicine | Admitting: Emergency Medicine

## 2017-12-02 ENCOUNTER — Emergency Department (HOSPITAL_COMMUNITY): Payer: Medicare HMO

## 2017-12-02 ENCOUNTER — Encounter (HOSPITAL_COMMUNITY): Payer: Self-pay | Admitting: Emergency Medicine

## 2017-12-02 ENCOUNTER — Other Ambulatory Visit: Payer: Self-pay

## 2017-12-02 DIAGNOSIS — R739 Hyperglycemia, unspecified: Secondary | ICD-10-CM

## 2017-12-02 DIAGNOSIS — I4891 Unspecified atrial fibrillation: Secondary | ICD-10-CM | POA: Insufficient documentation

## 2017-12-02 DIAGNOSIS — I951 Orthostatic hypotension: Secondary | ICD-10-CM | POA: Diagnosis not present

## 2017-12-02 DIAGNOSIS — I5032 Chronic diastolic (congestive) heart failure: Secondary | ICD-10-CM | POA: Diagnosis not present

## 2017-12-02 DIAGNOSIS — E039 Hypothyroidism, unspecified: Secondary | ICD-10-CM | POA: Diagnosis not present

## 2017-12-02 DIAGNOSIS — J449 Chronic obstructive pulmonary disease, unspecified: Secondary | ICD-10-CM | POA: Diagnosis not present

## 2017-12-02 DIAGNOSIS — R42 Dizziness and giddiness: Secondary | ICD-10-CM

## 2017-12-02 DIAGNOSIS — F17210 Nicotine dependence, cigarettes, uncomplicated: Secondary | ICD-10-CM | POA: Insufficient documentation

## 2017-12-02 DIAGNOSIS — Z950 Presence of cardiac pacemaker: Secondary | ICD-10-CM | POA: Insufficient documentation

## 2017-12-02 DIAGNOSIS — R69 Illness, unspecified: Secondary | ICD-10-CM | POA: Diagnosis not present

## 2017-12-02 DIAGNOSIS — R51 Headache: Secondary | ICD-10-CM | POA: Insufficient documentation

## 2017-12-02 DIAGNOSIS — Z79899 Other long term (current) drug therapy: Secondary | ICD-10-CM | POA: Diagnosis not present

## 2017-12-02 DIAGNOSIS — Z8673 Personal history of transient ischemic attack (TIA), and cerebral infarction without residual deficits: Secondary | ICD-10-CM | POA: Insufficient documentation

## 2017-12-02 DIAGNOSIS — E785 Hyperlipidemia, unspecified: Secondary | ICD-10-CM | POA: Diagnosis not present

## 2017-12-02 DIAGNOSIS — I110 Hypertensive heart disease with heart failure: Secondary | ICD-10-CM | POA: Diagnosis not present

## 2017-12-02 DIAGNOSIS — E1165 Type 2 diabetes mellitus with hyperglycemia: Secondary | ICD-10-CM | POA: Insufficient documentation

## 2017-12-02 DIAGNOSIS — R079 Chest pain, unspecified: Secondary | ICD-10-CM | POA: Diagnosis not present

## 2017-12-02 HISTORY — DX: Personal history of other diseases of the digestive system: Z87.19

## 2017-12-02 HISTORY — DX: Dizziness and giddiness: R42

## 2017-12-02 LAB — CBC WITH DIFFERENTIAL/PLATELET
BASOS ABS: 0 10*3/uL (ref 0.0–0.1)
BASOS PCT: 0 %
Eosinophils Absolute: 0.4 10*3/uL (ref 0.0–0.7)
Eosinophils Relative: 6 %
HEMATOCRIT: 45.8 % (ref 39.0–52.0)
Hemoglobin: 16.2 g/dL (ref 13.0–17.0)
Lymphocytes Relative: 30 %
Lymphs Abs: 2.1 10*3/uL (ref 0.7–4.0)
MCH: 34.1 pg — ABNORMAL HIGH (ref 26.0–34.0)
MCHC: 35.4 g/dL (ref 30.0–36.0)
MCV: 96.4 fL (ref 78.0–100.0)
Monocytes Absolute: 0.5 10*3/uL (ref 0.1–1.0)
Monocytes Relative: 7 %
NEUTROS ABS: 3.9 10*3/uL (ref 1.7–7.7)
NEUTROS PCT: 57 %
Platelets: 153 10*3/uL (ref 150–400)
RBC: 4.75 MIL/uL (ref 4.22–5.81)
RDW: 12.4 % (ref 11.5–15.5)
WBC: 6.9 10*3/uL (ref 4.0–10.5)

## 2017-12-02 LAB — CBG MONITORING, ED
GLUCOSE-CAPILLARY: 242 mg/dL — AB (ref 70–99)
Glucose-Capillary: 361 mg/dL — ABNORMAL HIGH (ref 70–99)

## 2017-12-02 LAB — COMPREHENSIVE METABOLIC PANEL
ALT: 37 U/L (ref 0–44)
ANION GAP: 11 (ref 5–15)
AST: 20 U/L (ref 15–41)
Albumin: 4.2 g/dL (ref 3.5–5.0)
Alkaline Phosphatase: 111 U/L (ref 38–126)
BILIRUBIN TOTAL: 0.9 mg/dL (ref 0.3–1.2)
BUN: 19 mg/dL (ref 8–23)
CO2: 24 mmol/L (ref 22–32)
Calcium: 9.2 mg/dL (ref 8.9–10.3)
Chloride: 99 mmol/L (ref 98–111)
Creatinine, Ser: 0.78 mg/dL (ref 0.61–1.24)
Glucose, Bld: 329 mg/dL — ABNORMAL HIGH (ref 70–99)
POTASSIUM: 4.3 mmol/L (ref 3.5–5.1)
Sodium: 134 mmol/L — ABNORMAL LOW (ref 135–145)
TOTAL PROTEIN: 7.3 g/dL (ref 6.5–8.1)

## 2017-12-02 LAB — TROPONIN I

## 2017-12-02 LAB — D-DIMER, QUANTITATIVE (NOT AT ARMC): D DIMER QUANT: 0.34 ug{FEU}/mL (ref 0.00–0.50)

## 2017-12-02 MED ORDER — MECLIZINE HCL 25 MG PO TABS: 25.0000 mg | ORAL_TABLET | Freq: Three times a day (TID) | ORAL | 0 refills | Status: DC | PRN

## 2017-12-02 MED ORDER — SODIUM CHLORIDE 0.9 % IV BOLUS
1000.0000 mL | Freq: Once | INTRAVENOUS | Status: AC
  Administered 2017-12-02: 1000 mL via INTRAVENOUS

## 2017-12-02 MED ORDER — MECLIZINE HCL 12.5 MG PO TABS
25.0000 mg | ORAL_TABLET | Freq: Once | ORAL | Status: AC
  Administered 2017-12-02: 25 mg via ORAL
  Filled 2017-12-02: qty 2

## 2017-12-02 NOTE — ED Provider Notes (Signed)
[facility] EMERGENCY DEPARTMENT Provider Note   CSN: [id number] Arrival date & time: 12/02/17  1353     History   Chief Complaint Chief Complaint  Patient presents with  . Hyperglycemia    HPI Ralph MCBRYAR is a 67 y.o. male.  HPI  Pt was seen at 1450. Per pt, c/o gradual onset and persistence of constant "high blood sugars" for the past 1 week. Pt states his "5 day CBG has averaged 350." Pt has been without his DM meds since March (5 months). Pt also states he has not been taking his xarelto. Pt states he has had constant generalized chest "pain" for the past 2-3 days. Describes this as located in his anterior chest and constant. States "nothing" makes it better.  Pt also states he has felt "dizzy" for the past several weeks. Pt describes the dizziness as "everything is moving around," which worsens with turning his head side to side, changing body positions, and walking. Denies palpitations, no SOB/cough, no abd pain, no N/V/D, no visual changes, no focal motor weakness, no tingling/numbness in extremities, no slurred speech, no facial droop.    Past Medical History:  Diagnosis Date  . Anginal pain ([place])   . Arthritis    "back; fingers" (01/06/2012)  . Atrial flutter [facility])    s/p EPS +RF ablation of typical atrial flutter April 2015  . Cancer ([place])    skin cancer  . CHB (complete heart block) ([place])   . CHF (congestive heart failure) ([place]) 01/06/2012  . Chronic lower back pain   . 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 Coughing up blood    "comes from my throat" (01/06/2012)  . DDD (degenerative disc disease), lumbar   . Depressed   . Difficult intubation    Eschmann stylet used in 2002 and 2007; "trouble waking up afterwards" (01/06/2012)  . Emphysema   . Fatty liver disease, nonalcoholic   . GERD (gastroesophageal reflux disease)   . H/O hiatal hernia   . Headache   . History of esophageal varices   . Hypertension   . Hypothyroidism   . Orthostatic dizziness    . Pacemaker   . Pneumonia Aug 2016  . Presence of permanent cardiac pacemaker [id number]/292013   [employer]  . Sinus pause 01/06/2012   5.2 seconds  . Sleep apnea    "don't wear mask" (01/06/2012)  . Stroke [facility])    pt states that he might have had a stroke not sure  . Type II diabetes mellitus ([place])   . Varicose vein    of esophagus    Patient Active Problem List   Diagnosis Date Noted  . Hyperlipidemia 04/02/2015  . Essential hypertension, benign 04/02/2015  . Other specified hypothyroidism 04/02/2015  . History of colonic polyps   . Encounter for screening colonoscopy 05/07/2014  . Psoriasis 10/15/2013  . Hypoxemia 09/05/2013  . SVT (supraventricular tachycardia) ([place]) 06/06/2013  . COPD (chronic obstructive pulmonary disease) ([place]) 06/06/2013  . Cardiac pacemaker in situ 06/06/2013  . Pacemaker 05/22/2013  . Cirrhosis ([place]) 01/22/2013  . Dyspepsia 01/22/2013  . Anemia 01/22/2013  . Atrial fibrillation ([place]) 11/21/2012  . Atrial flutter ([place]) 11/21/2012  . Chest pain 11/20/2012  .  Acute on chronic diastolic heart failure, predominantly Right heart failure 01/09/2012  . Syncope 01/06/2012  . Sinus arrest, 5 second pause on event monitor 01/06/2012  . Type 2 diabetes mellitus, uncontrolled ([place]) 01/06/2012  . Obesity 01/06/2012  . Smoker 01/06/2012  .  Nocturnal hypoxemia 01/06/2012  . DJD, on disability secondary to back pain, surg X 3 01/06/2012  . ABDOMINAL PAIN, RIGHT UPPER QUADRANT 06/14/2009  . ABDOMINAL PAIN 05/14/2009  . Hepatic cirrhosis ([place]) 01/13/2009  . GERD 12/04/2008  . GASTRITIS, ACUTE 12/04/2008  . HOARSENESS 12/04/2008  . CHEST PAIN 12/04/2008  . Dysphagia, pharyngoesophageal phase 12/04/2008    Past Surgical History:  Procedure Laterality Date  . ATRIAL FLUTTER ABLATION N/A 07/10/2013   Procedure: ATRIAL FLUTTER ABLATION;  Surgeon: Evans Lance, MD;  Location: [facility] CATH LAB;  Service: Cardiovascular;  Laterality: N/A;  . BACK SURGERY    .  CHOLECYSTECTOMY  1993  . COLONOSCOPY  11/08/2004   GBT:DVVOHY rectum, colon, TI.  Marland Kitchen COLONOSCOPY N/A 05/28/2014   Dr. Gala Romney: Redundant colon. single colonic polyp removed as described above. Tubular adenoma  . ESOPHAGEAL DILATION N/A 05/28/2014   Procedure: ESOPHAGEAL DILATION;  Surgeon: Daneil Dolin, MD;  Location: [facility] ENDO [facility];  Service: Endoscopy;  Laterality: N/A;  . ESOPHAGOGASTRODUODENOSCOPY  11/08/2004   WVP:XTGGYI esophageal erosions consistent with erosive reflux esophagitis/Areas of hemorrhage and nodularity of the fundal mucosa of uncertain significance, biopsied.  Small hiatal hernia, otherwise normal stomach  . ESOPHAGOGASTRODUODENOSCOPY  2010   Dr. Gala Romney: 3 columns Grade 1 varices, erosive esophagitis, HH, portal gastropathy, normal D1, D2  . ESOPHAGOGASTRODUODENOSCOPY N/A 05/28/2014   Dr. Gala Romney: MIld erosive reflux esophagitis. Grade 1 esophageal varices. Patent esophagus. No dilation performed. Hiatal hernia.   Marland Kitchen ESOPHAGOGASTRODUODENOSCOPY (EGD) WITH ESOPHAGEAL DILATION N/A 02/14/2013   RSW:NIOEV 1 esophageal varices. Abnormal distal esophagus/status post biopsy after Maloney dilation. Portal gastropathy. Antral erosions-status post biopsy. path negative for H.pylori, benign path.  . LUMBAR [place]; ~ 1995; ~ 1996  . NASAL SEPTUM SURGERY  1992  . nuclear stress test  10/19/2004   No ischemia  . PERMANENT PACEMAKER INSERTION  01/08/2012   CHB  . PERMANENT PACEMAKER INSERTION N/A 01/09/2012   Procedure: PERMANENT PACEMAKER INSERTION;  Surgeon: Sanda Klein, MD;  Location: [place] CATH LAB;  Service: Cardiovascular;  Laterality: N/A;  . POSTERIOR FUSION LUMBAR SPINE  1999   L4-5  . SPINAL CORD STIMULATOR IMPLANT  2006  . SPINAL CORD STIMULATOR REMOVAL N/A 01/27/2015   Procedure: LUMBAR SPINAL CORD STIMULATOR REMOVAL;  Surgeon: Kristeen Miss, MD;  Location: [place] NEURO ORS;  Service: Neurosurgery;  Laterality: N/A;  LUMBAR SPINAL CORD STIMULATOR REMOVAL  . TONSILLECTOMY AND  ADENOIDECTOMY  1992  . US ECHOCARDIOGRAPHY  12/28/2011   mild LVH,mild mitral annulara ca+,mild MR  . Warthin's tumor excision  1990's   right        Home Medications    Prior to Admission medications   Medication Sig Start Date End Date Taking? Authorizing Provider  diltiazem (CARDIZEM CD) 180 MG 24 hr capsule TAKE ONE CAPSULE BY MOUTH EVERY DAY 11/06/17  Yes Branch, Alphonse Guild, MD  levothyroxine (SYNTHROID, LEVOTHROID) 112 MCG tablet TAKE 1 TABLET BY MOUTH EVERY MORNING BEFORE BREAKFAST Patient taking differently: Take 112 mcg by mouth every morning.  04/24/17  Yes Nida, Marella Chimes, MD  Omega-3 Fatty Acids (FISH OIL) 1200 MG CAPS Take by mouth 2 (two) times daily.   Yes [provider]  ONETOUCH VERIO test strip USE TO TEST BLOOD SUGAR FOUR TIMES A DAY 09/23/16  Yes Nida, Marella Chimes, MD  pantoprazole (PROTONIX) 40 MG tablet TAKE 1 TABLET (40 MG TOTAL) BY MOUTH DAILY. 01/16/17  Yes Carlis Stable, NP  propranolol (INDERAL) 20 MG tablet TAKE 1  TABLET BY MOUTH TWICE A DAY 08/20/17  Yes Mahala Menghini, PA-C  simvastatin (ZOCOR) 20 MG tablet TAKE 1 TABLET BY MOUTH EVERY EVENING 11/20/17  Yes Branch, Alphonse Guild, MD  spironolactone (ALDACTONE) 50 MG tablet TAKE 1 TABLET (50 MG TOTAL) BY MOUTH 2 (TWO) TIMES DAILY. 06/08/17  Yes Carlis Stable, NP  dapagliflozin propanediol (FARXIGA) 10 MG TABS tablet Take 10 mg by mouth daily. Patient not taking: Reported on 12/02/2017 01/18/17   Cassandria Anger, MD  flecainide (TAMBOCOR) 100 MG tablet TAKE 1 TABLET BY MOUTH TWICE A DAY Patient not taking: Reported on 12/02/2017 03/22/17   Evans Lance, MD  furosemide (LASIX) 20 MG tablet TAKE 2 TABLETS BY MOUTH DAILY AS NEEDED FOR SWELLING Patient not taking: Reported on 12/02/2017 01/30/17   Arnoldo Lenis, MD  Insulin Detemir (LEVEMIR FLEXTOUCH) 100 UNIT/ML Pen Inject 50 Units into the skin at bedtime. Patient not taking: Reported on 12/02/2017 01/19/16   Cassandria Anger, MD    metFORMIN (GLUCOPHAGE) 1000 MG tablet TAKE 1 TABLET BY MOUTH TWICE A DAY Patient not taking: Reported on 12/02/2017 02/27/17   Cassandria Anger, MD  nadolol (CORGARD) 40 MG tablet TAKE 1 TABLET IN THE MORNING AND 1/2 TABLET BY MOUTH EVERY EVENING Patient not taking: Reported on 12/02/2017 06/13/17   Annitta Needs, NP  ondansetron (ZOFRAN) 4 MG tablet Take 1 tablet (4 mg total) by mouth every 8 (eight) hours as needed for nausea or vomiting. Patient not taking: Reported on 12/02/2017 07/21/14   Mahala Menghini, PA-C  XARELTO 20 MG TABS tablet TAKE 1 TABLET BY MOUTH EVERY DAY WITH DINNER Patient not taking: Reported on 12/02/2017 06/06/17   Arnoldo Lenis, MD    Family History Family History  Problem Relation Age of Onset  . Cancer Mother        Deceased, 20  . Ovarian cancer Mother   . Arrhythmia Father   . Other Father        Deceased 45  . Stroke Brother   . Stroke Brother   . Stroke Sister   . Crohn's disease Daughter     Social History Social History   Tobacco Use  . Smoking status: Current Every Day Smoker    Packs/day: 0.25    Years: 45.00    Pack years: 11.25    Types: Cigarettes    Start date: 04/11/1968    Last attempt to quit: 04/12/2015    Years since quitting: 2.6  . Smokeless tobacco: Never Used  . Tobacco comment: Quit x 8 months this time  Substance Use Topics  . Alcohol use: No    Alcohol/week: 0.0 standard drinks    Comment: "quit alcohol 2011" Previously drinking socially about twice per month  . Drug use: No     Allergies   Nitroglycerin   Review of Systems Review of Systems ROS: Statement: All systems negative except as marked or noted in the HPI; Constitutional: Negative for fever and chills. ; ; Eyes: Negative for eye pain, redness and discharge. ; ; ENMT: Negative for ear pain, hoarseness, nasal congestion, sinus pressure and sore throat. ; ; Cardiovascular: +CP. Negative for palpitations, diaphoresis, dyspnea and peripheral edema. ; ;  Respiratory: Negative for cough, wheezing and stridor. ; ; Gastrointestinal: Negative for nausea, vomiting, diarrhea, abdominal pain, blood in stool, hematemesis, jaundice and rectal bleeding. . ; ; Genitourinary: Negative for dysuria, flank pain and hematuria. ; ; Musculoskeletal: Negative for back pain and neck pain.  Negative for swelling and trauma.; ; Skin: Negative for pruritus, rash, abrasions, blisters, bruising and skin lesion.; ; Neuro: +"dizziness." Negative for headache, lightheadedness and neck stiffness. Negative for weakness, altered level of consciousness, altered mental status, extremity weakness, paresthesias, involuntary movement, seizure and syncope.       Physical Exam Updated Vital Signs BP 128/61   Pulse 64   Temp 98.8 F (37.1 C) (Oral)   Resp 18   Ht 5\' 10"  (1.778 m)   Wt 98.9 kg   SpO2 95%   BMI 31.28 kg/m    15:12:36 Orthostatic Vital Signs CW  Orthostatic Lying   BP- Lying: 117/70   Pulse- Lying: 60       Orthostatic Sitting  BP- Sitting: 131/66   Pulse- Sitting: 65       Orthostatic Standing at 0 minutes  BP- Standing at 0 minutes: 105/68   Pulse- Standing at 0 minutes: 69      Physical Exam 1455: Physical examination:  Nursing notes reviewed; Vital signs and O2 SAT reviewed;  Constitutional: Well developed, Well nourished, Well hydrated, In no acute distress; Head:  Normocephalic, atraumatic; Eyes: EOMI, PERRL, No scleral icterus; ENMT: TM's clear bilat. Mouth and pharynx normal, Mucous membranes moist; Neck: Supple, Full range of motion, No lymphadenopathy; Cardiovascular: Regular rate and rhythm, No gallop; Respiratory: Breath sounds clear & equal bilaterally, No wheezes.  Speaking full sentences with ease, Normal respiratory effort/excursion; Chest: Nontender, Movement normal; Abdomen: Soft, Nontender, Nondistended, Normal bowel sounds; Genitourinary: No CVA tenderness; Extremities: Peripheral pulses normal, No tenderness, No edema, No calf edema  or asymmetry.; Neuro: AA&Ox3, Major CN grossly intact. Speech clear.  No facial droop.  +right horizontal end gaze fatigable nystagmus which reproduces pt's symptoms. Grips equal. Strength 5/5 equal bilat UE's and LE's.  DTR 2/4 equal bilat UE's and LE's.  No gross sensory deficits.  Normal cerebellar testing bilat UE's (finger-nose) and LE's (heel-shin)..; Skin: Color normal, Warm, Dry.   ED Treatments / Results  Labs (all labs ordered are listed, but only abnormal results are displayed)   EKG EKG Interpretation  Date/Time:  Saturday December 02 2017 14:56:08 EDT Ventricular Rate:  64 PR Interval:    QRS Duration: 113 QT Interval:  413 QTC Calculation: 427 R Axis:   -8 Text Interpretation:  Atrial-paced rhythm Borderline intraventricular conduction delay Low voltage, extremity leads Baseline wander When compared with ECG of 05/05/2016 No significant change was found Confirmed by Francine Graven [phone]) on 12/02/2017 3:12:53 PM   Radiology   Procedures Procedures (including critical care time)  Medications Ordered in ED Medications  sodium chloride 0.9 % bolus 1,000 mL (1,000 mLs Intravenous New Bag/Given 12/02/17 1522)  meclizine (ANTIVERT) tablet 25 mg (25 mg Oral Given 12/02/17 1521)     Initial Impression / Assessment and Plan / ED Course  I have reviewed the triage vital signs and the nursing notes.  Pertinent labs & imaging results that were available during my care of the patient were reviewed by me and considered in my medical decision making (see chart for details).  MDM Reviewed: previous chart, nursing note and vitals Reviewed previous: labs and ECG Interpretation: labs, ECG, x-ray and CT scan   Results for orders placed or performed during the hospital encounter of 12/02/17  Comprehensive metabolic panel  Result Value Ref Range   Sodium 134 (L) 135 - 145 mmol/L   Potassium 4.3 3.5 - 5.1 mmol/L   Chloride 99 98 - 111 mmol/L   CO2 24 22 -  32 mmol/L   Glucose,  Bld 329 (H) 70 - 99 mg/dL   BUN 19 8 - 23 mg/dL   Creatinine, Ser 0.78 0.61 - 1.24 mg/dL   Calcium 9.2 8.9 - 10.3 mg/dL   Total Protein 7.3 6.5 - 8.1 g/dL   Albumin 4.2 3.5 - 5.0 g/dL   AST 20 15 - 41 U/L   ALT 37 0 - 44 U/L   Alkaline Phosphatase 111 38 - 126 U/L   Total Bilirubin 0.9 0.3 - 1.2 mg/dL   GFR calc non Af Amer >60 >60 mL/min   GFR calc Af Amer >60 >60 mL/min   Anion gap 11 5 - 15  Troponin I  Result Value Ref Range   Troponin I <0.03 <0.03 ng/mL  CBC with Differential  Result Value Ref Range   WBC 6.9 4.0 - 10.5 K/uL   RBC 4.75 4.22 - 5.81 MIL/uL   Hemoglobin 16.2 13.0 - 17.0 g/dL   HCT 45.8 39.0 - 52.0 %   MCV 96.4 78.0 - 100.0 fL   MCH 34.1 (H) 26.0 - 34.0 pg   MCHC 35.4 30.0 - 36.0 g/dL   RDW 12.4 11.5 - 15.5 %   Platelets 153 150 - 400 K/uL   Neutrophils Relative % 57 %   Neutro Abs 3.9 1.7 - 7.7 K/uL   Lymphocytes Relative 30 %   Lymphs Abs 2.1 0.7 - 4.0 K/uL   Monocytes Relative 7 %   Monocytes Absolute 0.5 0.1 - 1.0 K/uL   Eosinophils Relative 6 %   Eosinophils Absolute 0.4 0.0 - 0.7 K/uL   Basophils Relative 0 %   Basophils Absolute 0.0 0.0 - 0.1 K/uL  D-dimer, quantitative  Result Value Ref Range   D-Dimer, Quant 0.34 0.00 - 0.50 ug/mL-FEU  Troponin I  Result Value Ref Range   Troponin I <0.03 <0.03 ng/mL  CBG monitoring, ED  Result Value Ref Range   Glucose-Capillary 361 (H) 70 - 99 mg/dL  CBG monitoring, ED  Result Value Ref Range   Glucose-Capillary 242 (H) 70 - 99 mg/dL   Dg Chest 2 View Result Date: 12/02/2017 CLINICAL DATA:  Chest pain.  Hyperglycemia. EXAM: CHEST - 2 VIEW COMPARISON:  Chest x-ray June 06, 2013 FINDINGS: The heart, hila, and mediastinum are normal. No pneumothorax. Opacity in the left apex is stable since February 2015, of no acute significance. No nodules or masses. Stable pacemaker. No other acute abnormalities. IMPRESSION: No active cardiopulmonary disease. Electronically Signed   By: Dorise Bullion III M.D   On:  12/02/2017 15:57   Ct Head Wo Contrast Result Date: 12/02/2017 CLINICAL DATA:  Increased blood sugar. Change in mental status. Headache. EXAM: CT HEAD WITHOUT CONTRAST TECHNIQUE: Contiguous axial images were obtained from the base of the skull through the vertex without intravenous contrast. COMPARISON:  None. FINDINGS: Brain: No subdural, epidural, or subarachnoid hemorrhage. Cerebellum, brainstem, and basal cisterns are. The ventricles and sulci are normal. Minimal white matter changes. No acute cortical ischemia or infarct. No mass effect or midline shift. Vascular: No hyperdense vessel or unexpected calcification. Skull: Normal. Negative for fracture or focal lesion. Sinuses/Orbits: No acute finding. Other: None. IMPRESSION: No acute intracranial abnormalities. Electronically Signed   By: Dorise Bullion III M.D   On: 12/02/2017 16:13    1930:  Mild orthostasis on VS and CBG elevated; IVF bolus given.  Pt has tol PO well while in the ED without N/V. Pt ambulated with steady gait, easy resps, NAD. Feels better  after antivert and IVF, wants to go home now. CBG improved after IVF. AG normal. Doubt PE as cause for symptoms with normal d-dimer and low risk [place].  Doubt ACS as cause for symptoms with normal troponin x2 and unchanged EKG from previous after 2-3 days of constant symptoms. Pt states he "can't afford" all of his rx (has the rx at home and does not need me to write any more). States "it doesn't matter if you write them anyway because I can't fill them." Multiple resources given to pt; no SW or CM available at this time. Dx and testing d/w pt and family.  Questions answered.  Verb understanding, agreeable to d/c home with outpt f/u.    Final Clinical Impressions(s) / ED Diagnoses   Final diagnoses:  None    ED Discharge Orders    None       Francine Graven, DO 12/06/17 2355

## 2017-12-02 NOTE — Discharge Instructions (Signed)
Take the prescription as directed.  Call your regular medical doctor on Monday to schedule a follow up appointment within the next 3 days.  Return to the Emergency Department immediately sooner if worsening.  ° °

## 2017-12-02 NOTE — ED Notes (Signed)
Date and time results received: 12/02/17 1830 (use smartphrase ".now" to insert current time)  Test: abg Critical Value: ph 6.942                        po2 > chart                        pco2 92.6                        so2 89.4 Name of Provider Notified: [email or web address]  Orders Received? Or Actions Taken?: see chart

## 2017-12-02 NOTE — ED Triage Notes (Signed)
Pt reports BS at home 390, Pt unable to afford medications for diabetes and has been without since March. Pt trying to manage with diet and exercise. Pt reports feels "goofy", nauseous, and has a headache.

## 2017-12-02 NOTE — ED Notes (Signed)
Pt ambulated in hallway, steady gate. Said he felt "slightly" lightheaded but decreased the more he walked.

## 2017-12-07 ENCOUNTER — Ambulatory Visit (INDEPENDENT_AMBULATORY_CARE_PROVIDER_SITE_OTHER): Payer: Medicare HMO | Admitting: *Deleted

## 2017-12-07 ENCOUNTER — Telehealth: Payer: Self-pay

## 2017-12-07 DIAGNOSIS — E6609 Other obesity due to excess calories: Secondary | ICD-10-CM | POA: Diagnosis not present

## 2017-12-07 DIAGNOSIS — R69 Illness, unspecified: Secondary | ICD-10-CM | POA: Diagnosis not present

## 2017-12-07 DIAGNOSIS — I495 Sick sinus syndrome: Secondary | ICD-10-CM

## 2017-12-07 DIAGNOSIS — I10 Essential (primary) hypertension: Secondary | ICD-10-CM | POA: Diagnosis not present

## 2017-12-07 DIAGNOSIS — E1129 Type 2 diabetes mellitus with other diabetic kidney complication: Secondary | ICD-10-CM | POA: Diagnosis not present

## 2017-12-07 DIAGNOSIS — G8929 Other chronic pain: Secondary | ICD-10-CM | POA: Diagnosis not present

## 2017-12-07 DIAGNOSIS — N182 Chronic kidney disease, stage 2 (mild): Secondary | ICD-10-CM | POA: Diagnosis not present

## 2017-12-07 DIAGNOSIS — E039 Hypothyroidism, unspecified: Secondary | ICD-10-CM | POA: Diagnosis not present

## 2017-12-07 DIAGNOSIS — Z6832 Body mass index (BMI) 32.0-32.9, adult: Secondary | ICD-10-CM | POA: Diagnosis not present

## 2017-12-07 DIAGNOSIS — E782 Mixed hyperlipidemia: Secondary | ICD-10-CM | POA: Diagnosis not present

## 2017-12-07 NOTE — Telephone Encounter (Signed)
LMOVM reminding pt to send remote transmission.   

## 2017-12-08 ENCOUNTER — Encounter: Payer: Self-pay | Admitting: Cardiology

## 2017-12-08 NOTE — Progress Notes (Signed)
Remote pacemaker transmission.   

## 2017-12-21 DIAGNOSIS — J449 Chronic obstructive pulmonary disease, unspecified: Secondary | ICD-10-CM | POA: Diagnosis not present

## 2017-12-21 DIAGNOSIS — I10 Essential (primary) hypertension: Secondary | ICD-10-CM | POA: Diagnosis not present

## 2017-12-21 DIAGNOSIS — N182 Chronic kidney disease, stage 2 (mild): Secondary | ICD-10-CM | POA: Diagnosis not present

## 2017-12-21 DIAGNOSIS — E1129 Type 2 diabetes mellitus with other diabetic kidney complication: Secondary | ICD-10-CM | POA: Diagnosis not present

## 2017-12-22 ENCOUNTER — Other Ambulatory Visit: Payer: Self-pay | Admitting: Cardiology

## 2017-12-26 DIAGNOSIS — R69 Illness, unspecified: Secondary | ICD-10-CM | POA: Diagnosis not present

## 2018-01-08 LAB — CUP PACEART REMOTE DEVICE CHECK
Battery Remaining Longevity: 70 mo
Battery Remaining Percentage: 65 %
Brady Statistic RA Percent Paced: 79 %
Date Time Interrogation Session: 20190830194631
Implantable Lead Implant Date: 20130920
Implantable Lead Location: 753859
Implantable Lead Location: 753860
Implantable Pulse Generator Implant Date: 20130920
Lead Channel Impedance Value: 430 Ohm
Lead Channel Pacing Threshold Amplitude: 0.5 V
Lead Channel Pacing Threshold Pulse Width: 0.4 ms
Lead Channel Sensing Intrinsic Amplitude: 4.9 mV
Lead Channel Setting Pacing Pulse Width: 1 ms
MDC IDC LEAD IMPLANT DT: 20130920
MDC IDC MSMT BATTERY VOLTAGE: 2.9 V
MDC IDC MSMT LEADCHNL RV IMPEDANCE VALUE: 390 Ohm
MDC IDC MSMT LEADCHNL RV PACING THRESHOLD AMPLITUDE: 1.25 V
MDC IDC MSMT LEADCHNL RV PACING THRESHOLD PULSEWIDTH: 1 ms
MDC IDC MSMT LEADCHNL RV SENSING INTR AMPL: 2.9 mV
MDC IDC SET LEADCHNL RA PACING AMPLITUDE: 2 V
MDC IDC SET LEADCHNL RV PACING AMPLITUDE: 2.5 V
MDC IDC SET LEADCHNL RV SENSING SENSITIVITY: 1 mV
MDC IDC STAT BRADY AP VP PERCENT: 1 %
MDC IDC STAT BRADY AP VS PERCENT: 79 %
MDC IDC STAT BRADY AS VP PERCENT: 1 %
MDC IDC STAT BRADY AS VS PERCENT: 21 %
MDC IDC STAT BRADY RV PERCENT PACED: 1 %
Pulse Gen Model: 2210
Pulse Gen Serial Number: 7393982

## 2018-01-11 ENCOUNTER — Ambulatory Visit: Payer: Medicare HMO | Admitting: "Endocrinology

## 2018-01-30 ENCOUNTER — Other Ambulatory Visit: Payer: Self-pay | Admitting: Nurse Practitioner

## 2018-02-12 ENCOUNTER — Other Ambulatory Visit: Payer: Self-pay | Admitting: Gastroenterology

## 2018-02-12 DIAGNOSIS — K7460 Unspecified cirrhosis of liver: Secondary | ICD-10-CM

## 2018-02-16 ENCOUNTER — Other Ambulatory Visit: Payer: Self-pay | Admitting: "Endocrinology

## 2018-02-17 ENCOUNTER — Other Ambulatory Visit: Payer: Self-pay | Admitting: Nurse Practitioner

## 2018-03-02 DIAGNOSIS — E1129 Type 2 diabetes mellitus with other diabetic kidney complication: Secondary | ICD-10-CM | POA: Diagnosis not present

## 2018-03-02 DIAGNOSIS — N182 Chronic kidney disease, stage 2 (mild): Secondary | ICD-10-CM | POA: Diagnosis not present

## 2018-03-02 DIAGNOSIS — G8929 Other chronic pain: Secondary | ICD-10-CM | POA: Diagnosis not present

## 2018-03-02 DIAGNOSIS — I4891 Unspecified atrial fibrillation: Secondary | ICD-10-CM | POA: Diagnosis not present

## 2018-03-12 ENCOUNTER — Telehealth: Payer: Self-pay

## 2018-03-13 ENCOUNTER — Ambulatory Visit (INDEPENDENT_AMBULATORY_CARE_PROVIDER_SITE_OTHER): Payer: Medicare HMO

## 2018-03-13 DIAGNOSIS — I495 Sick sinus syndrome: Secondary | ICD-10-CM | POA: Diagnosis not present

## 2018-03-13 NOTE — Progress Notes (Signed)
Remote pacemaker transmission.   

## 2018-03-19 DIAGNOSIS — E119 Type 2 diabetes mellitus without complications: Secondary | ICD-10-CM | POA: Diagnosis not present

## 2018-03-19 DIAGNOSIS — E6609 Other obesity due to excess calories: Secondary | ICD-10-CM | POA: Diagnosis not present

## 2018-03-19 DIAGNOSIS — Z23 Encounter for immunization: Secondary | ICD-10-CM | POA: Diagnosis not present

## 2018-03-19 DIAGNOSIS — Z6834 Body mass index (BMI) 34.0-34.9, adult: Secondary | ICD-10-CM | POA: Diagnosis not present

## 2018-03-19 DIAGNOSIS — Z1389 Encounter for screening for other disorder: Secondary | ICD-10-CM | POA: Diagnosis not present

## 2018-03-20 ENCOUNTER — Encounter: Payer: Self-pay | Admitting: Cardiology

## 2018-03-27 ENCOUNTER — Other Ambulatory Visit: Payer: Self-pay | Admitting: Internal Medicine

## 2018-03-27 NOTE — Telephone Encounter (Signed)
This is a [place] pt.  °

## 2018-04-09 ENCOUNTER — Encounter: Payer: Self-pay | Admitting: Internal Medicine

## 2018-04-12 ENCOUNTER — Other Ambulatory Visit: Payer: Self-pay | Admitting: Cardiology

## 2018-04-12 ENCOUNTER — Telehealth: Payer: Self-pay | Admitting: *Deleted

## 2018-04-12 NOTE — Telephone Encounter (Signed)
Spoke with patient to schedule overdue f/u with Dr. Lovena Le in [place]. Patient is agreeable to next available appointment on 05/17/18 at 9:15am. Patient is appreciative of call and denies questions or concerns at this time.  Called patient back after noting med list had Xarelto marked as "not taking" as of 11/2017. Patient confirmed that he has resumed taking Xarelto as instructed.

## 2018-04-18 ENCOUNTER — Ambulatory Visit (INDEPENDENT_AMBULATORY_CARE_PROVIDER_SITE_OTHER): Payer: Medicare HMO | Admitting: Internal Medicine

## 2018-04-18 ENCOUNTER — Encounter: Payer: Self-pay | Admitting: Internal Medicine

## 2018-04-18 VITALS — BP 132/70 | HR 76 | Ht 70.0 in | Wt 220.6 lb

## 2018-04-18 DIAGNOSIS — R69 Illness, unspecified: Secondary | ICD-10-CM | POA: Diagnosis not present

## 2018-04-18 DIAGNOSIS — E1142 Type 2 diabetes mellitus with diabetic polyneuropathy: Secondary | ICD-10-CM | POA: Diagnosis not present

## 2018-04-18 DIAGNOSIS — F17210 Nicotine dependence, cigarettes, uncomplicated: Secondary | ICD-10-CM | POA: Diagnosis not present

## 2018-04-18 MED ORDER — GLIPIZIDE 5 MG PO TABS: 5.0000 mg | ORAL_TABLET | Freq: Two times a day (BID) | ORAL | 3 refills | Status: DC

## 2018-04-18 NOTE — Progress Notes (Signed)
Name: Ralph [name]  MRN/ DOB: [id number], 1950/09/17   Age/ Sex: 68 y.o., male    PCP: Sharilyn Sites, MD   Reason for Endocrinology Evaluation: Type 2 Diabetes Mellitus     Date of Initial Endocrinology Visit: 04/18/2018     PATIENT IDENTIFIER: Ralph [name] is a 68 y.o. male with a past medical history of A.Fib (S/P Pacemaker), CHF, HTN, Hypothyroidism and T2DM  The patient presented for initial endocrinology clinic visit on 04/18/2018 for consultative assistance with his diabetes management.    HPI: Ralph [name] was    Diagnosed with T2DM ~ 10 yrs Prior Medications tried/Intolerance:  Currently checking blood sugars l x / day,  before breakfast. Hypoglycemia episodes : no             Hemoglobin A1c has ranged from 5.7% in 12/2015, peaking at 7.5% in 06/2015. Patient required assistance for hypoglycemia: no Patient has required hospitalization within the last 1 year from hyper or hypoglycemia: yes   In terms of diet, the patient avoids sugar-sweetened beverages , he snacks on low CHO snacks.  Pt has been on Metformin since diagnosis , followed by farxiga, levemir added 6 yrs ago    [place]: Metformin 1000 mg BID Farxiga 10 mg daily   Statin: Yes ACE-I/ARB: Not Prior Diabetic Education:Yes   METER DOWNLOAD SUMMARY: Date range evaluated: 03/20/18-04/18/18 Fingerstick Blood Glucose Tests = 8 Overall Mean FS Glucose = 280.6 Standard Deviation = 36.9  BG Ranges: Low = 236 High = 333   Hypoglycemic Events/30 Days: BG < 50 = 0 Episodes of symptomatic severe hypoglycemia = 0   DIABETIC COMPLICATIONS: Microvascular complications:    Denies: neuropathy, retinopathy   Last eye exam: Completed 04/2016  Macrovascular complications:   CHF   Denies: CAD, PVD, CVA   PAST HISTORY: Past Medical History:  Past Medical History:  Diagnosis Date  . Anginal pain ([place])   . Arthritis    "back; fingers" (01/06/2012)  . Atrial flutter [facility])    s/p EPS +RF  ablation of typical atrial flutter April 2015  . Cancer ([place])    skin cancer  . CHB (complete heart block) ([place])   . CHF (congestive heart failure) ([place]) 01/06/2012  . Chronic lower back pain   . 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 Coughing up blood    "comes from my throat" (01/06/2012)  . DDD (degenerative disc disease), lumbar   . Depressed   . Difficult intubation    Eschmann stylet used in 2002 and 2007; "trouble waking up afterwards" (01/06/2012)  . Emphysema   . Fatty liver disease, nonalcoholic   . GERD (gastroesophageal reflux disease)   . H/O hiatal hernia   . Headache   . History of esophageal varices   . Hypertension   . Hypothyroidism   . Orthostatic dizziness   . Pacemaker   . Pneumonia Aug 2016  . Presence of permanent cardiac pacemaker 9/292013   [employer]  . Sinus pause 01/06/2012   5.2 seconds  . Sleep apnea    "don't wear mask" (01/06/2012)  . Stroke [facility])    pt states that he might have had a stroke not sure  . Type II diabetes mellitus ([place])   . Varicose vein    of esophagus    Past Surgical History:  Past Surgical History:  Procedure Laterality Date  . ATRIAL FLUTTER ABLATION N/A 07/10/2013   Procedure: ATRIAL FLUTTER ABLATION;  Surgeon: Carleene Overlie  Peyton Najjar, MD;  Location: [facility] CATH LAB;  Service: Cardiovascular;  Laterality: N/A;  . BACK SURGERY    . CHOLECYSTECTOMY  1993  . COLONOSCOPY  11/08/2004   VZC:HYIFOY rectum, colon, TI.  Marland Kitchen COLONOSCOPY N/A 05/28/2014   Dr. Gala Romney: Redundant colon. single colonic polyp removed as described above. Tubular adenoma  . ESOPHAGEAL DILATION N/A 05/28/2014   Procedure: ESOPHAGEAL DILATION;  Surgeon: Daneil Dolin, MD;  Location[facility]O [facility];  Service: Endoscopy;  Laterality: N/A;  . ESOPHAGOGASTRODUODENOSCOPY  11/08/2004   DXA:JOINOM esophageal erosions consistent with erosive reflux esophagitis/Areas of hemorrhage and nodularity of the fundal mucosa of uncertain significance, biopsied.  Small hiatal hernia,  otherwise normal stomach  . ESOPHAGOGASTRODUODENOSCOPY  2010   Dr. Gala Romney: 3 columns Grade 1 varices, erosive esophagitis, HH, portal gastropathy, normal D1, D2  . ESOPHAGOGASTRODUODENOSCOPY N/A 05/28/2014   Dr. Gala Romney: MIld erosive reflux esophagitis. Grade 1 esophageal varices. Patent esophagus. No dilation performed. Hiatal hernia.   Marland Kitchen ESOPHAGOGASTRODUODENOSCOPY (EGD) WITH ESOPHAGEAL DILATION N/A 02/14/2013   VEH:MCNOB 1 esophageal varices. Abnormal distal esophagus/status post biopsy after Maloney dilation. Portal gastropathy. Antral erosions-status post biopsy. path negative for H.pylori, benign path.  . LUMBAR [place]; ~ 1995; ~ 1996  . NASAL SEPTUM SURGERY  1992  . nuclear stress test  10/19/2004   No ischemia  . PERMANENT PACEMAKER INSERTION  01/08/2012   CHB  . PERMANENT PACEMAKER INSERTION N/A 01/09/2012   Procedure: PERMANENT PACEMAKER INSERTION;  Surgeon: Sanda Klein, MD;  Location: [place] CATH LAB;  Service: Cardiovascular;  Laterality: N/A;  . POSTERIOR FUSION LUMBAR SPINE  1999   L4-5  . SPINAL CORD STIMULATOR IMPLANT  2006  . SPINAL CORD STIMULATOR REMOVAL N/A 01/27/2015   Procedure: LUMBAR SPINAL CORD STIMULATOR REMOVAL;  Surgeon: Kristeen Miss, MD;  Location: [place] NEURO ORS;  Service: Neurosurgery;  Laterality: N/A;  LUMBAR SPINAL CORD STIMULATOR REMOVAL  . TONSILLECTOMY AND ADENOIDECTOMY  1992  . US ECHOCARDIOGRAPHY  12/28/2011   mild LVH,mild mitral annulara ca+,mild MR  . Warthin's tumor excision  1990's   right      Social History:  reports that he has been smoking cigarettes. He started smoking about 50 years ago. He has a 11.25 pack-year smoking history. He has never used smokeless tobacco. He reports that he does not drink alcohol or use drugs. Family History:  Family History  Problem Relation Age of Onset  . Cancer Mother        Deceased, 26  . Ovarian cancer Mother   . Arrhythmia Father   . Other Father        Deceased 62  . Stroke Brother   . Stroke  Brother   . Stroke Sister   . Crohn's disease Daughter       HOME MEDICATIONS: Allergies as of 04/18/2018      Reactions   Nitroglycerin Hives, Swelling, Rash      Medication List       Accurate as of April 18, 2018 11:08 AM. Always use your most recent med list.        dapagliflozin propanediol 10 MG Tabs tablet Commonly known as:  FARXIGA Take 10 mg by mouth daily.   diltiazem 180 MG 24 hr capsule Commonly known as:  CARDIZEM CD TAKE 1 CAPSULE BY MOUTH EVERY DAY   Fish Oil 1200 MG Caps Take by mouth 2 (two) times daily.   flecainide 100 MG tablet Commonly known as:  TAMBOCOR TAKE 1 TABLET BY MOUTH TWICE  A DAY   furosemide 20 MG tablet Commonly known as:  LASIX TAKE 2 TABLETS BY MOUTH DAILY AS NEEDED FOR SWELLING   Insulin Detemir 100 UNIT/ML Pen Commonly known as:  LEVEMIR FLEXTOUCH Inject 50 Units into the skin at bedtime.   levothyroxine 112 MCG tablet Commonly known as:  SYNTHROID, LEVOTHROID TAKE 1 TABLET BY MOUTH EVERY MORNING BEFORE BREAKFAST   meclizine 25 MG tablet Commonly known as:  ANTIVERT Take 1 tablet (25 mg total) by mouth 3 (three) times daily as needed for dizziness.   metFORMIN 1000 MG tablet Commonly known as:  GLUCOPHAGE TAKE 1 TABLET BY MOUTH TWICE A DAY   nadolol 40 MG tablet Commonly known as:  CORGARD TAKE 1 TABLET IN THE MORNING AND 1/2 TABLET BY MOUTH EVERY EVENING   ondansetron 4 MG tablet Commonly known as:  ZOFRAN Take 1 tablet (4 mg total) by mouth every 8 (eight) hours as needed for nausea or vomiting.   ONETOUCH VERIO test strip Generic drug:  glucose blood USE TO TEST BLOOD SUGAR FOUR TIMES A DAY   pantoprazole 40 MG tablet Commonly known as:  PROTONIX TAKE 1 TABLET (40 MG TOTAL) BY MOUTH DAILY.   propranolol 20 MG tablet Commonly known as:  INDERAL TAKE 1 TABLET BY MOUTH TWICE A DAY   simvastatin 20 MG tablet Commonly known as:  ZOCOR TAKE 1 TABLET BY MOUTH EVERY DAY IN THE EVENING   spironolactone 50 MG  tablet Commonly known as:  ALDACTONE TAKE 1 TABLET BY MOUTH TWICE A DAY   XARELTO 20 MG Tabs tablet Generic drug:  rivaroxaban TAKE 1 TABLET BY MOUTH EVERY DAY WITH DINNER        ALLERGIES: Allergies  Allergen Reactions  . Nitroglycerin Hives, Swelling and Rash     REVIEW OF SYSTEMS: A comprehensive ROS was conducted with the patient and is negative except as per HPI and below:  Review of Systems  Constitutional: Negative for malaise/fatigue and weight loss.  HENT: Negative for congestion and sore throat.   Eyes: Negative for blurred vision and pain.  Respiratory: Positive for cough. Negative for shortness of breath.   Cardiovascular: Negative for chest pain and palpitations.  Gastrointestinal: Negative for diarrhea and nausea.  Genitourinary: Negative for frequency.  Skin: Positive for itching.  Neurological: Negative for tingling and tremors.  Endo/Heme/Allergies: Negative for polydipsia.  Psychiatric/Behavioral: Negative for depression. The patient is not nervous/anxious.       OBJECTIVE:   VITAL SIGNS: BP 132/70 (BP Location: Left Arm, Patient Position: Sitting, Cuff Size: Normal)   Pulse 76   Ht 5\' 10"  (1.778 m)   Wt 220 lb 9.6 oz (100.1 kg)   SpO2 98%   BMI 31.65 kg/m    PHYSICAL EXAM:  General: Pt appears well and is in NAD  Hydration: Well-hydrated with moist mucous membranes and good skin turgor  HEENT: Head: Unremarkable with good dentition. Oropharynx clear without exudate.  Eyes: External eye exam normal without stare, lid lag or exophthalmos.  EOM intact.  PERRL.  Neck: General: Supple without adenopathy or carotid bruits. Thyroid: Thyroid size normal.  No goiter or nodules appreciated. No thyroid bruit.  Lungs: Clear with good BS bilat with no rales, rhonchi, or wheezes  Heart: RRR with normal S1 and S2 and no gallops; no murmurs; no rub  Abdomen: Normoactive bowel sounds, soft, nontender, without masses or organomegaly palpable  Extremities:    Lower extremities - No pretibial edema. No lesions.  Skin: Normal texture and temperature  to palpation. No rash noted. No Acanthosis nigricans/skin tags. No lipohypertrophy.  Neuro: MS is good with appropriate affect, pt is alert and Ox3    DM foot exam:  The skin of the feet is intact without sores or ulcerations. Multiple plantar callous formation noted The pedal pulses are 2+ on right and 2+ on left. The sensation is decreased to a screening 5.07, 10 gram monofilament bilaterally   DATA REVIEWED:  Lab Results  Component Value Date   HGBA1C 7.1 (H) 11/21/2016   HGBA1C 6.6 (H) 08/11/2016   HGBA1C 6.3 (H) 04/13/2016   Lab Results  Component Value Date   LDLCALC 26 12/11/2015   CREATININE 0.78 12/02/2017    Lab Results  Component Value Date   CHOL 111 (L) 12/11/2015   HDL 30 (L) 12/11/2015   LDLCALC 26 12/11/2015   TRIG 276 (H) 12/11/2015   CHOLHDL 3.7 12/11/2015        03/19/2018 10.8%   ASSESSMENT / PLAN / RECOMMENDATIONS:   1) Type 2 Diabetes Mellitus, Poorly controlled, With Neuropathic  complications - Most recent A1c of 10.3 %. Goal A1c <7.0 %.    Plan: GENERAL: I have discussed with the patient the pathophysiology of diabetes. We went over the natural progression of the disease. We talked about both insulin resistance and insulin deficiency. We stressed the importance of lifestyle changes including diet and exercise. I explained the complications associated with diabetes including retinopathy, nephropathy, neuropathy as well as increased risk of cardiovascular disease. We went over the benefit seen with glycemic control.    I explained to the patient that diabetic patients are at higher than normal risk for amputations. The patient was informed that diabetes is the number one cause of non-traumatic amputations in [place].   Pt unable to afford insulin  The past, he has not had Levemir in quite sometimes.   Praised patient on sugar-sweetened beverages and  discussed low CHO snack options.   We discussed options such as restarting basal insulin again, vs trying GLP-1 agonists vs trying SU.   We have agreed to add SU , we discussed risk of hypoglycemia and weight gain with Glipizide.   He will call us in 2 weeks if BG's are over 200 mg/dL    MEDICATIONS: - Continue Metformin 1000 mg Twice a day with meals  - Continue Farxiga 10 mg once a day  - Start Glipizide 5 mg twice a day with meals   EDUCATION / INSTRUCTIONS:  BG monitoring instructions: Patient is instructed to check his blood sugars 2 times a day, fasting and bedtime  Call [facility] if: BG persistently < 70 or > 300. . I reviewed the Rule of 15 for the treatment of hypoglycemia in detail with the patient. Literature supplied.   2) Diabetic complications:   Eye: Does not have known diabetic retinopathy.   Neuro/ Feet: Does have known diabetic peripheral neuropathy based on exam   Renal: Patient does not have known baseline CKD. He is not on an ACEI/ARB at present.   3) Lipids: Patient is on a statin.    4) Hypertension: He is at goal of < 140/90 mmHg.    5) Tobacco use :  Over 3 minutes were spent counseling the patient about tobacco cessation.     F/u in 4 weeks     Signed electronically by: Mack Guise, MD  [facility]  [facility] [address] [place], [place] [address] Phone: [phone] FAX:  [phone]   CC: Sharilyn Sites, MD [address] [place] [address] Phone: [phone]  Fax: [phone]    Return to Endocrinology clinic as below: Future Appointments  Date Time Provider [place]  05/17/2018  9:15 AM Evans Lance, MD [facility] [place] [facility]  06/12/2018 11:05 AM [facility] DEVICE REMOTES [facility]

## 2018-04-18 NOTE — Patient Instructions (Addendum)
-   Check sugars twice a day (fasting and bedtime) - Continue Metformin 1000 mg Twice a day with meals  - Continue Farxiga 10 mg once a day  - Start Glipizide 5 mg twice a day with MEALS  - If in 2 weeks, your sugars are still over 200 mg /dL please call us with your readings to make a determination on your medications  - Choose healthy, lower carb lower calorie snacks: toss salad, cooked vegetables, cottage cheese, peanut butter, low fat cheese / string cheese, lower sodium deli meat, tuna salad or chicken salad  - HOW TO TREAT LOW BLOOD SUGARS (Blood sugar LESS THAN 70 MG/DL)  Please follow the RULE OF 15 for the treatment of hypoglycemia treatment (when your (blood sugars are less than 70 mg/dL)    STEP 1: Take 15 grams of carbohydrates when your blood sugar is low, which includes:   3-4 GLUCOSE TABS  OR  3-4 OZ OF JUICE OR REGULAR SODA OR  ONE TUBE OF GLUCOSE GEL     STEP 2: RECHECK blood sugar in 15 MINUTES STEP 3: If your blood sugar is still low at the 15 minute recheck --> then, go back to STEP 1 and treat AGAIN with another 15 grams of carbohydrates.

## 2018-04-24 ENCOUNTER — Ambulatory Visit: Payer: Medicare HMO | Admitting: Internal Medicine

## 2018-05-02 ENCOUNTER — Other Ambulatory Visit: Payer: Self-pay | Admitting: *Deleted

## 2018-05-02 ENCOUNTER — Telehealth: Payer: Self-pay | Admitting: Internal Medicine

## 2018-05-02 MED ORDER — RIVAROXABAN 20 MG PO TABS: ORAL_TABLET | ORAL | 0 refills | Status: DC

## 2018-05-02 NOTE — Telephone Encounter (Signed)
Called pt and left voicemail for pt to call back and elaborate as to what he is speaking on regarding this message.

## 2018-05-02 NOTE — Telephone Encounter (Signed)
Patient states he has his 2 week readings for Dr.Shamleffer. Please Advise, thanks

## 2018-05-03 LAB — CUP PACEART REMOTE DEVICE CHECK
Battery Remaining Longevity: 62 mo
Battery Voltage: 2.89 V
Brady Statistic AS VP Percent: 1 %
Brady Statistic RA Percent Paced: 74 %
Date Time Interrogation Session: 20191203082200
Implantable Lead Implant Date: 20130920
Implantable Lead Location: 753859
Lead Channel Impedance Value: 410 Ohm
Lead Channel Impedance Value: 440 Ohm
Lead Channel Pacing Threshold Amplitude: 0.5 V
Lead Channel Pacing Threshold Pulse Width: 0.4 ms
Lead Channel Sensing Intrinsic Amplitude: 3.4 mV
Lead Channel Setting Pacing Amplitude: 2 V
Lead Channel Setting Pacing Pulse Width: 1 ms
MDC IDC LEAD IMPLANT DT: 20130920
MDC IDC LEAD LOCATION: 753860
MDC IDC MSMT BATTERY REMAINING PERCENTAGE: 57 %
MDC IDC MSMT LEADCHNL RA SENSING INTR AMPL: 1.7 mV
MDC IDC MSMT LEADCHNL RV PACING THRESHOLD AMPLITUDE: 1.25 V
MDC IDC MSMT LEADCHNL RV PACING THRESHOLD PULSEWIDTH: 1 ms
MDC IDC PG IMPLANT DT: 20130920
MDC IDC SET LEADCHNL RV PACING AMPLITUDE: 2.5 V
MDC IDC SET LEADCHNL RV SENSING SENSITIVITY: 1 mV
MDC IDC STAT BRADY AP VP PERCENT: 1 %
MDC IDC STAT BRADY AP VS PERCENT: 74 %
MDC IDC STAT BRADY AS VS PERCENT: 26 %
MDC IDC STAT BRADY RV PERCENT PACED: 1 %
Pulse Gen Model: 2210
Pulse Gen Serial Number: 7393982

## 2018-05-08 DIAGNOSIS — J449 Chronic obstructive pulmonary disease, unspecified: Secondary | ICD-10-CM | POA: Diagnosis not present

## 2018-05-08 DIAGNOSIS — I10 Essential (primary) hypertension: Secondary | ICD-10-CM | POA: Diagnosis not present

## 2018-05-08 DIAGNOSIS — E1129 Type 2 diabetes mellitus with other diabetic kidney complication: Secondary | ICD-10-CM | POA: Diagnosis not present

## 2018-05-08 DIAGNOSIS — N182 Chronic kidney disease, stage 2 (mild): Secondary | ICD-10-CM | POA: Diagnosis not present

## 2018-05-09 ENCOUNTER — Telehealth: Payer: Self-pay | Admitting: Cardiology

## 2018-05-09 MED ORDER — RIVAROXABAN 20 MG PO TABS: ORAL_TABLET | ORAL | 0 refills | Status: AC

## 2018-05-09 NOTE — Telephone Encounter (Signed)
Please send 90 day supply of Xarelto to [facility] mail order pharmacy/tg

## 2018-05-09 NOTE — Telephone Encounter (Signed)
DONE

## 2018-05-10 DIAGNOSIS — H11153 Pinguecula, bilateral: Secondary | ICD-10-CM | POA: Diagnosis not present

## 2018-05-10 DIAGNOSIS — H31002 Unspecified chorioretinal scars, left eye: Secondary | ICD-10-CM | POA: Diagnosis not present

## 2018-05-10 DIAGNOSIS — E119 Type 2 diabetes mellitus without complications: Secondary | ICD-10-CM | POA: Diagnosis not present

## 2018-05-10 DIAGNOSIS — H5200 Hypermetropia, unspecified eye: Secondary | ICD-10-CM | POA: Diagnosis not present

## 2018-05-10 DIAGNOSIS — H25813 Combined forms of age-related cataract, bilateral: Secondary | ICD-10-CM | POA: Diagnosis not present

## 2018-05-10 DIAGNOSIS — E109 Type 1 diabetes mellitus without complications: Secondary | ICD-10-CM | POA: Diagnosis not present

## 2018-05-16 ENCOUNTER — Ambulatory Visit: Payer: Medicare HMO | Admitting: Internal Medicine

## 2018-05-16 ENCOUNTER — Encounter: Payer: Self-pay | Admitting: Internal Medicine

## 2018-05-16 VITALS — BP 128/76 | HR 76 | Ht 70.0 in | Wt 224.2 lb

## 2018-05-16 DIAGNOSIS — E1142 Type 2 diabetes mellitus with diabetic polyneuropathy: Secondary | ICD-10-CM | POA: Diagnosis not present

## 2018-05-16 MED ORDER — INSULIN GLARGINE-LIXISENATIDE 100-33 UNT-MCG/ML ~~LOC~~ SOPN: 15.0000 [IU] | PEN_INJECTOR | Freq: Every day | SUBCUTANEOUS | 6 refills | Status: DC

## 2018-05-16 MED ORDER — INSULIN PEN NEEDLE 32G X 6 MM MISC: 6 refills | Status: DC

## 2018-05-16 NOTE — Patient Instructions (Signed)
-   STOP Glipizide - Start Soliqua 15 units daily with breakfast  - Continue Metformin 1000 mg twice a day  - Continue Farxiga 10 mg daily  - Please send me a message one week after starting the insulin with 3 days worth of sugar data.

## 2018-05-16 NOTE — Progress Notes (Signed)
Name: Ralph [name]  Age/ Sex: 68 y.o., male   MRN/ DOB: [id number], 1950/06/12     PCP: Sharilyn Sites, MD   Reason for Endocrinology Evaluation: Type 2 Diabetes Mellitus  Initial Endocrine Consultative Visit: 04/18/2018    PATIENT IDENTIFIER: Ralph [name] is a 68 y.o. male with a past medical history of A.Fib (S/P Pacemaker), CHF, HTN, Hypothyroidism and T2DM. The patient has followed with Endocrinology clinic since 04/18/2017 for consultative assistance with management of his diabetes.  DIABETIC HISTORY:  Ralph [name] was diagnosed with T2DM in ~ 2009, he has been on oral glycemic agents for years, initially was only on metformin followed by farxiga and levemir added ~ 6 yrs ago but was unable to afford it. His hemoglobin A1c has ranged from 5.7% in 12/2015, peaking at 7.5% in 06/2015.   SUBJECTIVE:   During the last visit (04/18/18): His A1c was 10.3 %, we added Glipizide to farxiga and metformin.  Patient unable to afford insulin at the time.  Today (05/16/2018): Ralph [name] is here for one-month follow-up appointment diabetes management He checks his blood sugars 2 times daily, fasting and bedtime. The patient has not had hypoglycemic episodes since the last clinic visit. Otherwise, the patient has not required any recent emergency interventions for hypoglycemia and has not had recent hospitalizations secondary to hyper or hypoglycemic episodes.    ROS: As per HPI and as detailed below: Review of Systems  Constitutional: Negative for fever and weight loss.  Respiratory: Negative for cough and shortness of breath.   Cardiovascular: Negative for chest pain and palpitations.  Gastrointestinal: Positive for nausea. Negative for diarrhea and vomiting.      HOME DIABETES REGIMEN:  - Metformin 1000 mg Twice a day with meals  - Farxiga 10 mg once a day  - Glipizide 5 mg twice a day with meals      METER  DOWNLOAD SUMMARY: Date range evaluated: 1/7-05/16/18 Fingerstick Blood Glucose Tests = 53 Overall Mean FS Glucose = 244 Standard Deviation = 43.4  BG Ranges: Low = 135 High = 310   Hypoglycemic Events/30 Days: BG < 50 =0 Episodes of symptomatic severe hypoglycemia = 0    HISTORY:  Past Medical History:  Past Medical History:  Diagnosis Date  . Anginal pain ([place])   . Arthritis    "back; fingers" (01/06/2012)  . Atrial flutter [facility])    s/p EPS +RF ablation of typical atrial flutter April 2015  . Cancer ([place])    skin cancer  . CHB (complete heart block) ([place])   . CHF (congestive heart failure) ([place]) 01/06/2012  . Chronic lower back pain   . 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 Coughing up blood    "comes from my throat" (01/06/2012)  . DDD (degenerative disc disease), lumbar   . Depressed   . Difficult intubation    Eschmann stylet used in 2002 and 2007; "trouble waking up afterwards" (01/06/2012)  . Emphysema   . Fatty liver disease, nonalcoholic   . GERD (gastroesophageal reflux disease)   . H/O hiatal hernia   . Headache   . History of esophageal varices   . Hypertension   . Hypothyroidism   . Orthostatic dizziness   . Pacemaker   . Pneumonia Aug 2016  . Presence of permanent cardiac pacemaker 9/292013   St.Jude  . Sinus pause 01/06/2012   5.2 seconds  . Sleep apnea    "don't wear mask" (01/06/2012)  .  Stroke [facility])    pt states that he might have had a stroke not sure  . Type II diabetes mellitus ([place])   . Varicose vein    of esophagus   Past Surgical History:  Past Surgical History:  Procedure Laterality Date  . ATRIAL FLUTTER ABLATION N/A 07/10/2013   Procedure: ATRIAL FLUTTER ABLATION;  Surgeon: Evans Lance, MD;  Location: [facility] CATH LAB;  Service: Cardiovascular;  Laterality: N/A;  . BACK SURGERY    . CHOLECYSTECTOMY  1993  . COLONOSCOPY  11/08/2004   QIH:[id number] rectum, colon, TI.  Marland Kitchen COLONOSCOPY N/A 05/28/2014   Dr. Gala Romney: Redundant colon.  single colonic polyp removed as described above. Tubular adenoma  . ESOPHAGEAL DILATION N/A 05/28/2014   Procedure: ESOPHAGEAL DILATION;  Surgeon: Daneil Dolin, MD;  [facility];  Service: Endoscopy;  Laterality: N/A;  . ESOPHAGOGASTRODUODENOSCOPY  11/08/2004   DGL:OVFIEP esophageal erosions consistent with erosive reflux esophagitis/Areas of hemorrhage and nodularity of the fundal mucosa of uncertain significance, biopsied.  Small hiatal hernia, otherwise normal stomach  . ESOPHAGOGASTRODUODENOSCOPY  2010   Dr. Gala Romney: 3 columns Grade 1 varices, erosive esophagitis, HH, portal gastropathy, normal D1, D2  . ESOPHAGOGASTRODUODENOSCOPY N/A 05/28/2014   Dr. Gala Romney: MIld erosive reflux esophagitis. Grade 1 esophageal varices. Patent esophagus. No dilation performed. Hiatal hernia.   Marland Kitchen ESOPHAGOGASTRODUODENOSCOPY (EGD) WITH ESOPHAGEAL DILATION N/A 02/14/2013   PIR:JJOAC 1 esophageal varices. Abnormal distal esophagus/status post biopsy after Maloney dilation. Portal gastropathy. Antral erosions-status post biopsy. path negative for H.pylori, benign path.  . LUMBAR [place]; ~ 1995; ~ 1996  . NASAL SEPTUM SURGERY  1992  . nuclear stress test  10/19/2004   No ischemia  . PERMANENT PACEMAKER INSERTION  01/08/2012   CHB  . PERMANENT PACEMAKER INSERTION N/A 01/09/2012   Procedure: PERMANENT PACEMAKER INSERTION;  Surgeon: Sanda Klein, MD;  Location: [facility] CATH LAB;  Service: Cardiovascular;  Laterality: N/A;  . POSTERIOR FUSION LUMBAR SPINE  1999   L4-5  . SPINAL CORD STIMULATOR IMPLANT  2006  . SPINAL CORD STIMULATOR REMOVAL N/A 01/27/2015   Procedure: LUMBAR SPINAL CORD STIMULATOR REMOVAL;  Surgeon: Kristeen Miss, MD;  Location: [place] NEURO ORS;  Service: Neurosurgery;  Laterality: N/A;  LUMBAR SPINAL CORD STIMULATOR REMOVAL  . TONSILLECTOMY AND ADENOIDECTOMY  1992  . US ECHOCARDIOGRAPHY  12/28/2011   mild LVH,mild mitral annulara ca+,mild MR  . Warthin's tumor excision  1990's   right      Social History:  reports that he has been smoking cigarettes. He started smoking about 50 years ago. He has a 11.25 pack-year smoking history. He has never used smokeless tobacco. He reports that he does not drink alcohol or use drugs. Family History:  Family History  Problem Relation Age of Onset  . Cancer Mother        Deceased, 67  . Ovarian cancer Mother   . Arrhythmia Father   . Other Father        Deceased 44  . Stroke Brother   . Stroke Brother   . Stroke Sister   . Crohn's disease Daughter   . Diabetes Maternal Grandmother      HOME MEDICATIONS: Allergies as of 05/16/2018      Reactions   Nitroglycerin Hives, Swelling, Rash      Medication List       Accurate as of May 16, 2018  9:11 PM. Always use your most recent med list.        dapagliflozin propanediol  10 MG Tabs tablet Commonly known as:  FARXIGA Take 10 mg by mouth daily.   diltiazem 180 MG 24 hr capsule Commonly known as:  CARDIZEM CD TAKE 1 CAPSULE BY MOUTH EVERY DAY   Fish Oil 1200 MG Caps Take by mouth 2 (two) times daily.   flecainide 100 MG tablet Commonly known as:  TAMBOCOR TAKE 1 TABLET BY MOUTH TWICE A DAY   furosemide 20 MG tablet Commonly known as:  LASIX TAKE 2 TABLETS BY MOUTH DAILY AS NEEDED FOR SWELLING   glipiZIDE 5 MG tablet Commonly known as:  GLUCOTROL Take 1 tablet (5 mg total) by mouth 2 (two) times daily with a meal.   Insulin Glargine-Lixisenatide 100-33 UNT-MCG/ML Sopn Commonly known as:  SOLIQUA Inject 15 Units into the skin daily with breakfast.   Insulin Pen Needle 32G X 6 MM Misc Commonly known as:  BD PEN NEEDLE MICRO U/F Daily   levothyroxine 112 MCG tablet Commonly known as:  SYNTHROID, LEVOTHROID TAKE 1 TABLET BY MOUTH EVERY MORNING BEFORE BREAKFAST   metFORMIN 1000 MG tablet Commonly known as:  GLUCOPHAGE TAKE 1 TABLET BY MOUTH TWICE A DAY   nadolol 40 MG tablet Commonly known as:  CORGARD TAKE 1 TABLET IN THE MORNING AND 1/2 TABLET BY  MOUTH EVERY EVENING   ondansetron 4 MG tablet Commonly known as:  ZOFRAN Take 1 tablet (4 mg total) by mouth every 8 (eight) hours as needed for nausea or vomiting.   ONETOUCH VERIO test strip Generic drug:  glucose blood USE TO TEST BLOOD SUGAR FOUR TIMES A DAY   pantoprazole 40 MG tablet Commonly known as:  PROTONIX TAKE 1 TABLET (40 MG TOTAL) BY MOUTH DAILY.   propranolol 20 MG tablet Commonly known as:  INDERAL TAKE 1 TABLET BY MOUTH TWICE A DAY   rivaroxaban 20 MG Tabs tablet Commonly known as:  XARELTO TAKE 1 TABLET BY MOUTH EVERY DAY WITH DINNER   simvastatin 20 MG tablet Commonly known as:  ZOCOR TAKE 1 TABLET BY MOUTH EVERY DAY IN THE EVENING   spironolactone 50 MG tablet Commonly known as:  ALDACTONE TAKE 1 TABLET BY MOUTH TWICE A DAY        OBJECTIVE:   Vital Signs: BP 128/76 (BP Location: Left Arm, Patient Position: Sitting, Cuff Size: Normal)   Pulse 76   Ht 5\' 10"  (1.778 m)   Wt 224 lb 3.2 oz (101.7 kg)   SpO2 92%   BMI 32.17 kg/m   Wt Readings from Last 3 Encounters:  05/16/18 224 lb 3.2 oz (101.7 kg)  04/18/18 220 lb 9.6 oz (100.1 kg)  12/02/17 218 lb (98.9 kg)     Exam: General: Pt appears well and is in NAD  Lungs: Clear with good BS bilat with no rales, rhonchi, or wheezes  Heart: RRR with normal S1 and S2 and no gallops; no murmurs; no rub  Abdomen: Normoactive bowel sounds, soft, nontender, without masses or organomegaly palpable  Extremities: No pretibial edema.   Neuro: MS is good with appropriate affect, pt is alert and Ox3   DM foot exam: 04/18/18   The skin of the feet is intact without sores or ulcerations. Multiple plantar callous formation noted The pedal pulses are 2+ on right and 2+ on left. The sensation is decreased to a screening 5.07, 10 gram monofilament bilaterally    DATA REVIEWED:  Lab Results  Component Value Date   HGBA1C 7.1 (H) 11/21/2016   HGBA1C 6.6 (H) 08/11/2016   HGBA1C  6.3 (H) 04/13/2016     Lab  Results  Component Value Date   CHOL 111 (L) 12/11/2015   HDL 30 (L) 12/11/2015   LDLCALC 26 12/11/2015   TRIG 276 (H) 12/11/2015   CHOLHDL 3.7 12/11/2015         ASSESSMENT / PLAN / RECOMMENDATIONS:   1)Type 2 Diabetes Mellitus, Poorly controlled With Neuropathic  complications - Most recent A1c of 10.3 %. Goal A1c <7.0 %.    -In review of his glucose meter, patient has was noted with hyperglycemia despite taking metformin, farxiga and glipizide.  -Patient was noted to have morning hypoglycemia, we discussed adding basal insulin versus a combination of basal insulin and a GLP-1 agonist, we discussed the benefit of combination medication and offsetting weight gain with basal insulin, also discussed side effects such as nausea and vomiting. -Patient was encouraged to call us with any side effects, he was also advised to take it with breakfast, he was also encouraged to call us with any coverage issues -Patient was advised to call us in a week with BG readings to titrate his medications up   MEDICATIONS:  Stop glipizide  Continue metformin 1000 mg twice a day with meals  Continue Farxiga 10 mg once a day  Start Soliqua 15 units daily with breakfast   EDUCATION / INSTRUCTIONS:  BG monitoring instructions: Patient is instructed to check his blood sugars 2 times a day, fasting and bedtime.  Call [place] [facility] if: BG persistently < 70 or > 300. . I reviewed the Rule of 15 for the treatment of hypoglycemia in detail with the patient. Literature supplied.   F/U in 6 weeks   Signed electronically by: Mack Guise, MD  [facility]  [address] [place], [place] [address] Phone: [phone] FAX: [phone]   CC: Sharilyn Sites, [place] [address] Phone: [phone]  Fax: [phone]  Return to Endocrinology clinic as below: Future Appointments  Date Time Provider  [place]  05/17/2018  9:15 AM Evans Lance, MD [facility]  06/12/2018 11:05 AM [facility] DEVICE REMOTES [facility]  06/28/2018  9:30 AM Shandale Malak, Melanie Crazier, MD [facility] None

## 2018-05-17 ENCOUNTER — Encounter: Payer: Self-pay | Admitting: Internal Medicine

## 2018-05-17 ENCOUNTER — Ambulatory Visit: Payer: Medicare HMO | Admitting: Internal Medicine

## 2018-05-17 VITALS — BP 116/64 | HR 69 | Ht 70.0 in | Wt 222.4 lb

## 2018-05-17 DIAGNOSIS — I455 Other specified heart block: Secondary | ICD-10-CM | POA: Diagnosis not present

## 2018-05-17 DIAGNOSIS — I4891 Unspecified atrial fibrillation: Secondary | ICD-10-CM | POA: Diagnosis not present

## 2018-05-17 LAB — CUP PACEART INCLINIC DEVICE CHECK
Battery Voltage: 2.89 V
Brady Statistic RA Percent Paced: 71 %
Date Time Interrogation Session: 20200206142146
Implantable Lead Implant Date: 20130920
Implantable Lead Location: 753859
Lead Channel Pacing Threshold Amplitude: 0.5 V
Lead Channel Pacing Threshold Amplitude: 0.75 V
Lead Channel Pacing Threshold Pulse Width: 0.4 ms
Lead Channel Setting Pacing Amplitude: 2 V
Lead Channel Setting Pacing Pulse Width: 1 ms
MDC IDC LEAD IMPLANT DT: 20130920
MDC IDC LEAD LOCATION: 753860
MDC IDC MSMT BATTERY REMAINING LONGEVITY: 72 mo
MDC IDC MSMT LEADCHNL RA IMPEDANCE VALUE: 450 Ohm
MDC IDC MSMT LEADCHNL RA PACING THRESHOLD AMPLITUDE: 0.5 V
MDC IDC MSMT LEADCHNL RA PACING THRESHOLD PULSEWIDTH: 0.4 ms
MDC IDC MSMT LEADCHNL RA SENSING INTR AMPL: 2.5 mV
MDC IDC MSMT LEADCHNL RV IMPEDANCE VALUE: 375 Ohm
MDC IDC MSMT LEADCHNL RV PACING THRESHOLD AMPLITUDE: 0.75 V
MDC IDC MSMT LEADCHNL RV PACING THRESHOLD PULSEWIDTH: 1 ms
MDC IDC MSMT LEADCHNL RV PACING THRESHOLD PULSEWIDTH: 1 ms
MDC IDC MSMT LEADCHNL RV SENSING INTR AMPL: 3.2 mV
MDC IDC PG IMPLANT DT: 20130920
MDC IDC SET LEADCHNL RV PACING AMPLITUDE: 2.5 V
MDC IDC SET LEADCHNL RV SENSING SENSITIVITY: 1 mV
MDC IDC STAT BRADY RV PERCENT PACED: 0.62 %
Pulse Gen Model: 2210
Pulse Gen Serial Number: 7393982

## 2018-05-17 NOTE — Patient Instructions (Signed)
Medication Instructions:  Your physician recommends that you continue on your current medications as directed. Please refer to the Current Medication list given to you today.  If you need a refill on your cardiac medications before your next appointment, please call your pharmacy.   Lab work: None   If you have labs (blood work) drawn today and your tests are completely normal, you will receive your results only by: . MyChart Message (if you have MyChart) OR . A paper copy in the mail If you have any lab test that is abnormal or we need to change your treatment, we will call you to review the results.  Testing/Procedures: None   Follow-Up: At [facility], you and your health needs are our priority.  As part of our continuing mission to provide you with exceptional heart care, we have created designated Provider Care Teams.  These Care Teams include your primary Cardiologist (physician) and Advanced Practice Providers (APPs -  Physician Assistants and Nurse Practitioners) who all work together to provide you with the care you need, when you need it. You will need a follow up appointment in 1 years.  Please call our office 2 months in advance to schedule this appointment.  You may see Gregg Taylor, MD or one of the following Advanced Practice Providers on your designated Care Team:   Amber Seiler, NP . Renee Ursuy, PA-C  Any Other Special Instructions Will Be Listed Below (If Applicable). Thank you for choosing [facility]!     

## 2018-05-17 NOTE — Progress Notes (Signed)
HPI [name] returns today for ongoing follow-up of sinus node dysfunction and paroxysmal atrial fibrillation, status post permanent pacemaker insertion. He also has a history of non-obstructive coronary artery disease and peripheral vascular disease. He denies chest pain, sob or syncope. He has some palpitations. He notes some residual soreness in his lower legs. Allergies  Allergen Reactions  . Nitroglycerin Hives, Swelling and Rash     Current Outpatient Medications  Medication Sig Dispense Refill  . dapagliflozin propanediol (FARXIGA) 10 MG TABS tablet Take 10 mg by mouth daily. 90 tablet 0  . diltiazem (CARDIZEM CD) 180 MG 24 hr capsule TAKE 1 CAPSULE BY MOUTH EVERY DAY 30 capsule 6  . flecainide (TAMBOCOR) 100 MG tablet TAKE 1 TABLET BY MOUTH TWICE A DAY 60 tablet 11  . furosemide (LASIX) 20 MG tablet TAKE 2 TABLETS BY MOUTH DAILY AS NEEDED FOR SWELLING 180 tablet 3  . Insulin Glargine-Lixisenatide (SOLIQUA) 100-33 UNT-MCG/ML SOPN Inject 15 Units into the skin daily with breakfast. 15 mL 6  . Insulin Pen Needle (BD PEN NEEDLE MICRO U/F) 32G X 6 MM MISC Daily 50 each 6  . levothyroxine (SYNTHROID, LEVOTHROID) 112 MCG tablet TAKE 1 TABLET BY MOUTH EVERY MORNING BEFORE BREAKFAST (Patient taking differently: Take 112 mcg by mouth every morning. ) 30 tablet 0  . metFORMIN (GLUCOPHAGE) 1000 MG tablet TAKE 1 TABLET BY MOUTH TWICE A DAY 180 tablet 0  . nadolol (CORGARD) 40 MG tablet TAKE 1 TABLET IN THE MORNING AND 1/2 TABLET BY MOUTH EVERY EVENING 135 tablet 2  . Omega-3 Fatty Acids (FISH OIL) 1200 MG CAPS Take by mouth 2 (two) times daily.    . ondansetron (ZOFRAN) 4 MG tablet Take 1 tablet (4 mg total) by mouth every 8 (eight) hours as needed for nausea or vomiting. 40 tablet 0  . ONETOUCH VERIO test strip USE TO TEST BLOOD SUGAR FOUR TIMES A DAY 150 each 5  . pantoprazole (PROTONIX) 40 MG tablet TAKE 1 TABLET (40 MG TOTAL) BY MOUTH DAILY. 90 tablet 3  . propranolol (INDERAL) 20  MG tablet TAKE 1 TABLET BY MOUTH TWICE A DAY 180 tablet 1  . rivaroxaban (XARELTO) 20 MG TABS tablet TAKE 1 TABLET BY MOUTH EVERY DAY WITH DINNER 90 tablet 0  . simvastatin (ZOCOR) 20 MG tablet TAKE 1 TABLET BY MOUTH EVERY DAY IN THE EVENING 90 tablet 3  . spironolactone (ALDACTONE) 50 MG tablet TAKE 1 TABLET BY MOUTH TWICE A DAY 180 tablet 2   No current facility-administered medications for this visit.      Past Medical History:  Diagnosis Date  . Anginal pain ([place])   . Arthritis    "back; fingers" (01/06/2012)  . Atrial flutter [facility])    s/p EPS +RF ablation of typical atrial flutter April 2015  . Cancer ([place])    skin cancer  . CHB (complete heart block) ([place])   . CHF (congestive heart failure) ([place]) 01/06/2012  . Chronic lower back pain   . 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 Coughing up blood    "comes from my throat" (01/06/2012)  . DDD (degenerative disc disease), lumbar   . Depressed   . Difficult intubation    Eschmann stylet used in 2002 and 2007; "trouble waking up afterwards" (01/06/2012)  . Emphysema   . Fatty liver disease, nonalcoholic   . GERD (gastroesophageal reflux disease)   . H/O hiatal hernia   . Headache   .  History of esophageal varices   . Hypertension   . Hypothyroidism   . Orthostatic dizziness   . Pacemaker   . Pneumonia Aug 2016  . Presence of permanent cardiac pacemaker 9/292013   St.Jude  . Sinus pause 01/06/2012   5.2 seconds  . Sleep apnea    "don't wear mask" (01/06/2012)  . Stroke [facility])    pt states that he might have had a stroke not sure  . Type II diabetes mellitus ([place])   . Varicose vein    of esophagus    ROS:   All systems reviewed and negative except as noted in the HPI.   Past Surgical History:  Procedure Laterality Date  . ATRIAL FLUTTER ABLATION N/A 07/10/2013   Procedure: ATRIAL FLUTTER ABLATION;  Surgeon: Evans Lance, MD;  Location: [facility] CATH LAB;  Service: Cardiovascular;  Laterality: N/A;  . BACK SURGERY     . CHOLECYSTECTOMY  1993  . COLONOSCOPY  11/08/2004   NWG:[id number] rectum, colon, TI.  Marland Kitchen COLONOSCOPY N/A 05/28/2014   Dr. Gala Romney: Redundant colon. single colonic polyp removed as described above. Tubular adenoma  . ESOPHAGEAL DILATION N/A 05/28/2014   Procedure: ESOPHAGEAL DILATION;  Surgeon: Daneil Dolin, MD;  [facility];  Service: Endoscopy;  Laterality: N/A;  . ESOPHAGOGASTRODUODENOSCOPY  11/08/2004   YQM:VHQION esophageal erosions consistent with erosive reflux esophagitis/Areas of hemorrhage and nodularity of the fundal mucosa of uncertain significance, biopsied.  Small hiatal hernia, otherwise normal stomach  . ESOPHAGOGASTRODUODENOSCOPY  2010   Dr. Gala Romney: 3 columns Grade 1 varices, erosive esophagitis, HH, portal gastropathy, normal D1, D2  . ESOPHAGOGASTRODUODENOSCOPY N/A 05/28/2014   Dr. Gala Romney: MIld erosive reflux esophagitis. Grade 1 esophageal varices. Patent esophagus. No dilation performed. Hiatal hernia.   Marland Kitchen ESOPHAGOGASTRODUODENOSCOPY (EGD) WITH ESOPHAGEAL DILATION N/A 02/14/2013   GEX:BMWUX 1 esophageal varices. Abnormal distal esophagus/status post biopsy after Maloney dilation. Portal gastropathy. Antral erosions-status post biopsy. path negative for H.pylori, benign path.  . LUMBAR [place]; ~ 1995; ~ 1996  . NASAL SEPTUM SURGERY  1992  . nuclear stress test  10/19/2004   No ischemia  . PERMANENT PACEMAKER INSERTION  01/08/2012   [facility]  . PERMANENT PACEMAKER INSERTION N/A 01/09/2012   Procedure: PERMANENT PACEMAKER INSERTION;  Surgeon: Sanda Klein, MD;  Location: [facility] CATH LAB;  Service: Cardiovascular;  Laterality: N/A;  . POSTERIOR FUSION LUMBAR SPINE  1999   L4-5  . SPINAL CORD STIMULATOR IMPLANT  2006  . SPINAL CORD STIMULATOR REMOVAL N/A 01/27/2015   Procedure: LUMBAR SPINAL CORD STIMULATOR REMOVAL;  Surgeon: Kristeen Miss, MD;  Location: [place] NEURO ORS;  Service: Neurosurgery;  Laterality: N/A;  LUMBAR SPINAL CORD STIMULATOR REMOVAL  . TONSILLECTOMY  AND ADENOIDECTOMY  1992  . US ECHOCARDIOGRAPHY  12/28/2011   mild LVH,mild mitral annulara ca+,mild MR  . 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 Cancer Mother        Deceased, 17  . Ovarian cancer Mother   . Arrhythmia Father   . Other Father        Deceased 31  . Stroke Brother   . Stroke Brother   . Stroke Sister   . Crohn's disease Daughter   . Diabetes Maternal Grandmother      Social History   Socioeconomic History  . Marital status: Married    Spouse name: Not on file  . Number of children: Not on file  .  Years of education: Not on file  . Highest education level: Not on file  Occupational History  . Not on file  Social Needs  . Financial resource strain: Not on file  . Food insecurity:    Worry: Not on file    Inability: Not on file  . Transportation needs:    Medical: Not on file    Non-medical: Not on file  Tobacco Use  . Smoking status: Former Smoker    Packs/day: 0.25    Years: 45.00    Pack years: 11.25    Types: Cigarettes    Start date: 04/11/1968    Last attempt to quit: 04/11/2014    Years since quitting: 4.1  . Smokeless tobacco: Never Used  . Tobacco comment: Quit x 8 months this time  Substance and Sexual Activity  . Alcohol use: No    Alcohol/week: 0.0 standard drinks    Comment: "quit alcohol 2011" Previously drinking socially about twice per month  . Drug use: No  . Sexual activity: Never  Lifestyle  . Physical activity:    Days per week: Not on file    Minutes per session: Not on file  . Stress: Not on file  Relationships  . Social connections:    Talks on phone: Not on file    Gets together: Not on file    Attends religious service: Not on file    Active member of club or organization: Not on file    Attends meetings of clubs or organizations: Not on file    Relationship status: Not on file  . Intimate partner violence:    Fear of current or ex partner: Not on file     Emotionally abused: Not on file    Physically abused: Not on file    Forced sexual activity: Not on file  Other Topics Concern  . Not on file  Social History Narrative   Lives with wife in a one story home.  Has 3 children.     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     Education: some college.     BP 116/64   Pulse 69   Ht 5\' 10"  (1.778 m)   Wt 222 lb 6.4 oz (100.9 kg)   SpO2 94%   BMI 31.91 kg/m   Physical Exam:  Well appearing72 yo man, NAD HEENT: Unremarkable Neck:  No JVD, no thyromegally Lymphatics:  No adenopathy Back:  No CVA tenderness Lungs:  Clear with no wheezes HEART:  Regular rate rhythm, no murmurs, no rubs, no clicks Abd:  soft, positive bowel sounds, no organomegally, no rebound, no guarding Ext:  2 plus pulses, trace edema, no cyanosis, no clubbing Skin:  No rashes no nodules Neuro:  CN II through XII intact, motor grossly intact  EKG - nsr with atrial pacing  DEVICE  Normal device function.  See PaceArt for details.   Assess/Plan: 1. Atrial tachy/flutter - he is maintaining NSR 99% of the time. He will continue his current meds.  2. Sinus node dysfunction - he is stable s/p PPM insertion. 3. PPM - his St. Jude DDD PM is working normally. 4. Venous insufficiency - we discussed keeping his legs elevated.   Mikle Bosworth.D.

## 2018-05-22 ENCOUNTER — Encounter: Payer: Self-pay | Admitting: Internal Medicine

## 2018-05-28 ENCOUNTER — Other Ambulatory Visit: Payer: Self-pay | Admitting: Internal Medicine

## 2018-05-28 MED ORDER — INSULIN GLARGINE-LIXISENATIDE 100-33 UNT-MCG/ML ~~LOC~~ SOPN: 18.0000 [IU] | PEN_INJECTOR | Freq: Every day | SUBCUTANEOUS | 6 refills | Status: DC

## 2018-05-28 NOTE — Telephone Encounter (Signed)
Please advise 

## 2018-06-06 DIAGNOSIS — Z0100 Encounter for examination of eyes and vision without abnormal findings: Secondary | ICD-10-CM | POA: Diagnosis not present

## 2018-06-07 NOTE — Telephone Encounter (Signed)
Opened in error

## 2018-06-12 ENCOUNTER — Ambulatory Visit (INDEPENDENT_AMBULATORY_CARE_PROVIDER_SITE_OTHER): Payer: Medicare HMO | Admitting: *Deleted

## 2018-06-12 DIAGNOSIS — I495 Sick sinus syndrome: Secondary | ICD-10-CM | POA: Diagnosis not present

## 2018-06-12 LAB — CUP PACEART REMOTE DEVICE CHECK
Battery Remaining Longevity: 62 mo
Battery Remaining Percentage: 57 %
Battery Voltage: 2.89 V
Brady Statistic AP VP Percent: 1 %
Brady Statistic AP VS Percent: 71 %
Brady Statistic AS VP Percent: 1 %
Brady Statistic AS VS Percent: 29 %
Brady Statistic RA Percent Paced: 70 %
Brady Statistic RV Percent Paced: 1 %
Date Time Interrogation Session: 20200303160202
Implantable Lead Implant Date: 20130920
Implantable Lead Implant Date: 20130920
Implantable Lead Location: 753859
Implantable Lead Location: 753860
Implantable Pulse Generator Implant Date: 20130920
Lead Channel Impedance Value: 380 Ohm
Lead Channel Impedance Value: 460 Ohm
Lead Channel Pacing Threshold Amplitude: 0.5 V
Lead Channel Pacing Threshold Amplitude: 0.75 V
Lead Channel Pacing Threshold Pulse Width: 0.4 ms
Lead Channel Pacing Threshold Pulse Width: 1 ms
Lead Channel Sensing Intrinsic Amplitude: 3.4 mV
Lead Channel Sensing Intrinsic Amplitude: 4.9 mV
Lead Channel Setting Pacing Amplitude: 2 V
Lead Channel Setting Pacing Amplitude: 2.5 V
Lead Channel Setting Pacing Pulse Width: 1 ms
Lead Channel Setting Sensing Sensitivity: 1 mV
Pulse Gen Model: 2210
Pulse Gen Serial Number: 7393982

## 2018-06-20 ENCOUNTER — Encounter: Payer: Self-pay | Admitting: Cardiology

## 2018-06-20 NOTE — Progress Notes (Signed)
Remote pacemaker transmission.   

## 2018-06-24 ENCOUNTER — Other Ambulatory Visit: Payer: Self-pay | Admitting: Cardiology

## 2018-06-26 ENCOUNTER — Encounter: Payer: Self-pay | Admitting: Internal Medicine

## 2018-06-26 NOTE — Progress Notes (Signed)
Received triage alert for [facility] episode. EGM suggests rapidly conducted A-flutter (1:1) with rates up to 232bpm (CL 253ms), duration ~24sec. On rivaroxaban, diltiazem, and flecainide per med list. Overall AT/AF burden 1.0%, available EGMs show A-tach. See V rate during AMS histogram below.  Routed to Dr. Lovena Le for review and any recommendations.

## 2018-06-27 NOTE — Patient Instructions (Signed)
Call placed to Pt.  Advised no changes at this time per Dr. Lovena Le.  Pt indicates understanding.

## 2018-06-28 ENCOUNTER — Ambulatory Visit: Payer: Medicare HMO | Admitting: Internal Medicine

## 2018-06-28 ENCOUNTER — Encounter: Payer: Self-pay | Admitting: Internal Medicine

## 2018-06-28 ENCOUNTER — Other Ambulatory Visit: Payer: Self-pay

## 2018-06-28 VITALS — BP 118/78 | HR 61 | Temp 97.4°F | Ht 70.0 in | Wt 227.8 lb

## 2018-06-28 DIAGNOSIS — E1129 Type 2 diabetes mellitus with other diabetic kidney complication: Secondary | ICD-10-CM | POA: Diagnosis not present

## 2018-06-28 DIAGNOSIS — E1142 Type 2 diabetes mellitus with diabetic polyneuropathy: Secondary | ICD-10-CM

## 2018-06-28 LAB — BASIC METABOLIC PANEL
BUN: 21 mg/dL (ref 6–23)
CHLORIDE: 99 meq/L (ref 96–112)
CO2: 30 mEq/L (ref 19–32)
Calcium: 9.5 mg/dL (ref 8.4–10.5)
Creatinine, Ser: 1 mg/dL (ref 0.40–1.50)
GFR: 74.88 mL/min (ref >60.00)
Glucose, Bld: 153 mg/dL — ABNORMAL HIGH (ref 70–99)
Potassium: 4.5 mEq/L (ref 3.5–5.1)
SODIUM: 137 meq/L (ref 135–145)

## 2018-06-28 LAB — POCT GLYCOSYLATED HEMOGLOBIN (HGB A1C): Hemoglobin A1C: 8.5 % — AB (ref 4.0–5.6)

## 2018-06-28 MED ORDER — INSULIN GLARGINE-LIXISENATIDE 100-33 UNT-MCG/ML ~~LOC~~ SOPN: 21.0000 [IU] | PEN_INJECTOR | Freq: Every day | SUBCUTANEOUS | 6 refills | Status: DC

## 2018-06-28 NOTE — Patient Instructions (Signed)
-   Increase Soliqua to 21 units daily with breakfast  - Continue Metformin 1000 mg twice a day  - Continue Farxiga 10 mg daily       -HOW TO TREAT LOW BLOOD SUGARS (Blood sugar LESS THAN 70 MG/DL)  Please follow the RULE OF 15 for the treatment of hypoglycemia treatment (when your (blood sugars are less than 70 mg/dL)    STEP 1: Take 15 grams of carbohydrates when your blood sugar is low, which includes:   3-4 GLUCOSE TABS  OR  3-4 OZ OF JUICE OR REGULAR SODA OR  ONE TUBE OF GLUCOSE GEL     STEP 2: RECHECK blood sugar in 15 MINUTES STEP 3: If your blood sugar is still low at the 15 minute recheck --> then, go back to STEP 1 and treat AGAIN with another 15 grams of carbohydrates.

## 2018-06-28 NOTE — Progress Notes (Signed)
Name: Ralph [name]  Age/ Sex: 68 y.o., male   MRN/ DOB: [id number], 1950/07/01     PCP: Sharilyn Sites, MD   Reason for Endocrinology Evaluation: Type 2 Diabetes Mellitus  Initial Endocrine Consultative Visit: 04/18/2018    PATIENT IDENTIFIER: Ralph [name] is a 68 y.o. male with a past medical history of A.Fib (S/P Pacemaker), CHF, HTN, Hypothyroidism and T2DM. The patient has followed with Endocrinology clinic since 04/18/2017 for consultative assistance with management of his diabetes.  DIABETIC HISTORY:  Ralph [name] was diagnosed with T2DM in ~ 2009, he has been on oral glycemic agents for years, initially was only on metformin followed by farxiga and levemir added ~ 6 yrs ago but was unable to afford it. His hemoglobin A1c has ranged from 5.7% in 12/2015, peaking at 7.5% in 06/2015.   SUBJECTIVE:   During the last visit (05/16/18): We stopped  Glipizide , continued farxiga and metformin and added Soliqua .    Today (06/28/2018): Ralph [name] is here for 6 week follow-up appointment on diabetes management He checks his blood sugars 2 times daily, fasting and bedtime. The patient has not had hypoglycemic episodes since the last clinic visit. Otherwise, the patient has not required any recent emergency interventions for hypoglycemia and has not had recent hospitalizations secondary to hyper or hypoglycemic episodes.    ROS: As per HPI and as detailed below: Review of Systems  Constitutional: Negative for fever and weight loss.  Respiratory: Negative for cough and shortness of breath.   Cardiovascular: Negative for chest pain and palpitations.  Gastrointestinal: Negative for diarrhea, nausea and vomiting.      HOME DIABETES REGIMEN:  - Metformin 1000 mg Twice a day with meals  - Farxiga 10 mg once a day  - Soliqua 18 units daily     METER DOWNLOAD SUMMARY: Date range evaluated: 2/19-3/19/2020  Fingerstick Blood Glucose Tests = 59 Overall Mean FS Glucose = 209.5 Standard Deviation = 59.2  BG Ranges: Low = 129 High = 527   Hypoglycemic Events/30 Days: BG < 50 =0 Episodes of symptomatic severe hypoglycemia = 0  DIABETIC COMPLICATIONS: Microvascular complications:   Neuropathy  Denies: retinopathy   Last eye exam: Completed 04/2016  Macrovascular complications:   CHF   Denies: CAD, PVD, CVA  HISTORY:  Past Medical History:  Past Medical History:  Diagnosis Date  . Anginal pain ([place])   . Arthritis    "back; fingers" (01/06/2012)  . Atrial flutter [facility])    s/p EPS +RF ablation of typical atrial flutter April 2015  . Cancer ([place])    skin cancer  . CHB (complete heart block) ([place])   . CHF (congestive heart failure) ([place]) 01/06/2012  . Chronic lower back pain   . 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 Coughing up blood    "comes from my throat" (01/06/2012)  . DDD (degenerative disc disease), lumbar   . Depressed   . Difficult intubation    Eschmann stylet used in 2002 and 2007; "trouble waking up afterwards" (01/06/2012)  . Emphysema   . Fatty liver disease, nonalcoholic   . GERD (gastroesophageal reflux disease)   . H/O hiatal hernia   . Headache   . History of esophageal varices   . Hypertension   . Hypothyroidism   . Orthostatic dizziness   . Pacemaker   . Pneumonia Aug 2016  . Presence of permanent cardiac pacemaker 9/292013   St.Jude  . Sinus pause 01/06/2012  5.2 seconds  . Sleep apnea    "don't wear mask" (01/06/2012)  . Stroke [facility])    pt states that he might have had a stroke not sure  . Type II diabetes mellitus ([place])   . Varicose vein    of esophagus   Past Surgical History:  Past Surgical History:  Procedure Laterality Date  . ATRIAL FLUTTER ABLATION N/A 07/10/2013   Procedure: ATRIAL FLUTTER ABLATION;  Surgeon: Evans Lance, MD;  Location: [facility] CATH LAB;  Service: Cardiovascular;  Laterality: N/A;  . BACK SURGERY    .  CHOLECYSTECTOMY  1993  . COLONOSCOPY  11/08/2004   BDZ:HGDJME rectum, colon, TI.  Marland Kitchen COLONOSCOPY N/A 05/28/2014   Dr. Gala Romney: Redundant colon. single colonic polyp removed as described above. Tubular adenoma  . ESOPHAGEAL DILATION N/A 05/28/2014   Procedure: ESOPHAGEAL DILATION;  Surgeon: Daneil Dolin, MD;  [facility];  Service: Endoscopy;  Laterality: N/A;  . ESOPHAGOGASTRODUODENOSCOPY  11/08/2004   QAS:TMHDQQ esophageal erosions consistent with erosive reflux esophagitis/Areas of hemorrhage and nodularity of the fundal mucosa of uncertain significance, biopsied.  Small hiatal hernia, otherwise normal stomach  . ESOPHAGOGASTRODUODENOSCOPY  2010   Dr. Gala Romney: 3 columns Grade 1 varices, erosive esophagitis, HH, portal gastropathy, normal D1, D2  . ESOPHAGOGASTRODUODENOSCOPY N/A 05/28/2014   Dr. Gala Romney: MIld erosive reflux esophagitis. Grade 1 esophageal varices. Patent esophagus. No dilation performed. Hiatal hernia.   Marland Kitchen ESOPHAGOGASTRODUODENOSCOPY (EGD) WITH ESOPHAGEAL DILATION N/A 02/14/2013   IWL:NLGXQ 1 esophageal varices. Abnormal distal esophagus/status post biopsy after Maloney dilation. Portal gastropathy. Antral erosions-status post biopsy. path negative for H.pylori, benign path.  . LUMBAR [place]; ~ 1995; ~ 1996  . NASAL SEPTUM SURGERY  1992  . nuclear stress test  10/19/2004   No ischemia  . PERMANENT PACEMAKER INSERTION  01/08/2012   CHB  . PERMANENT PACEMAKER INSERTION N/A 01/09/2012   Procedure: PERMANENT PACEMAKER INSERTION;  Surgeon: Sanda Klein, MD;  Location: [place] CATH LAB;  Service: Cardiovascular;  Laterality: N/A;  . POSTERIOR FUSION LUMBAR SPINE  1999   L4-5  . SPINAL CORD STIMULATOR IMPLANT  2006  . SPINAL CORD STIMULATOR REMOVAL N/A 01/27/2015   Procedure: LUMBAR SPINAL CORD STIMULATOR REMOVAL;  Surgeon: Kristeen Miss, MD;  Location: [place] NEURO ORS;  Service: Neurosurgery;  Laterality: N/A;  LUMBAR SPINAL CORD STIMULATOR REMOVAL  . TONSILLECTOMY AND  ADENOIDECTOMY  1992  . US ECHOCARDIOGRAPHY  12/28/2011   mild LVH,mild mitral annulara ca+,mild MR  . Warthin's tumor excision  1990's   right    Social History:  reports that he quit smoking about 4 years ago. His smoking use included cigarettes. He started smoking about 50 years ago. He has a 11.25 pack-year smoking history. He has never used smokeless tobacco. He reports that he does not drink alcohol or use drugs. Family History:  Family History  Problem Relation Age of Onset  . Cancer Mother        Deceased, 74  . Ovarian cancer Mother   . Arrhythmia Father   . Other Father        Deceased 20  . Stroke Brother   . Stroke Brother   . Stroke Sister   . Crohn's disease Daughter   . Diabetes Maternal Grandmother      HOME MEDICATIONS: Allergies as of 06/28/2018      Reactions   Nitroglycerin Hives, Swelling, Rash      Medication List       Accurate as of June 28, 2018 11:27 AM. Always use your most recent med list.        dapagliflozin propanediol 10 MG Tabs tablet Commonly known as:  Farxiga Take 10 mg by mouth daily.   diltiazem 180 MG 24 hr capsule Commonly known as:  CARDIZEM CD TAKE 1 CAPSULE BY MOUTH EVERY DAY   Fish Oil 1200 MG Caps Take by mouth 2 (two) times daily.   flecainide 100 MG tablet Commonly known as:  TAMBOCOR TAKE 1 TABLET BY MOUTH TWICE A DAY   furosemide 20 MG tablet Commonly known as:  LASIX TAKE 2 TABLETS BY MOUTH DAILY AS NEEDED FOR SWELLING   Insulin Glargine-Lixisenatide 100-33 UNT-MCG/ML Sopn Commonly known as:  Soliqua Inject 21 Units into the skin daily with breakfast.   Insulin Pen Needle 32G X 6 MM Misc Commonly known as:  BD Pen Needle Micro U/F Daily   levothyroxine 112 MCG tablet Commonly known as:  SYNTHROID, LEVOTHROID TAKE 1 TABLET BY MOUTH EVERY MORNING BEFORE BREAKFAST   metFORMIN 1000 MG tablet Commonly known as:  GLUCOPHAGE TAKE 1 TABLET BY MOUTH TWICE A DAY   nadolol 40 MG tablet Commonly known as:   CORGARD TAKE 1 TABLET IN THE MORNING AND 1/2 TABLET BY MOUTH EVERY EVENING   OneTouch Verio test strip Generic drug:  glucose blood USE TO TEST BLOOD SUGAR FOUR TIMES A DAY   pantoprazole 40 MG tablet Commonly known as:  PROTONIX TAKE 1 TABLET (40 MG TOTAL) BY MOUTH DAILY.   propranolol 20 MG tablet Commonly known as:  INDERAL TAKE 1 TABLET BY MOUTH TWICE A DAY   rivaroxaban 20 MG Tabs tablet Commonly known as:  Xarelto TAKE 1 TABLET BY MOUTH EVERY DAY WITH DINNER   simvastatin 20 MG tablet Commonly known as:  ZOCOR TAKE 1 TABLET BY MOUTH EVERY DAY IN THE EVENING   spironolactone 50 MG tablet Commonly known as:  ALDACTONE TAKE 1 TABLET BY MOUTH TWICE A DAY        OBJECTIVE:   Vital Signs: BP 118/78 (BP Location: Left Arm, Patient Position: Sitting, Cuff Size: Normal)   Pulse 61   Temp (!) 97.4 F (36.3 C)   Ht 5\' 10"  (1.778 m)   Wt 227 lb 12.8 oz (103.3 kg)   SpO2 94%   BMI 32.69 kg/m   Wt Readings from Last 3 Encounters:  06/28/18 227 lb 12.8 oz (103.3 kg)  05/17/18 222 lb 6.4 oz (100.9 kg)  05/16/18 224 lb 3.2 oz (101.7 kg)     Exam: General: Pt appears well and is in NAD  Lungs: Clear with good BS bilat with no rales, rhonchi, or wheezes  Heart: RRR with normal S1 and S2 and no gallops; no murmurs; no rub  Abdomen: Normoactive bowel sounds, soft, nontender, without masses or organomegaly palpable  Extremities: No pretibial edema.   Neuro: MS is good with appropriate affect, pt is alert and Ox3   DM foot exam: 04/18/18   The skin of the feet is intact without sores or ulcerations. Multiple plantar callous formation noted The pedal pulses are 2+ on right and 2+ on left. The sensation is decreased to a screening 5.07, 10 gram monofilament bilaterally    DATA REVIEWED:      Results for YAZID, [name] (MRN [id number]) as of 06/29/2018 09:40  Ref. Range 06/28/2018 09:43  Sodium Latest Ref Range: 135 - 145 mEq/L 137  Potassium Latest Ref Range: 3.5 - 5.1  mEq/L 4.5  Chloride Latest Ref Range:  96 - 112 mEq/L 99  CO2 Latest Ref Range: 19 - 32 mEq/L 30  Glucose Latest Ref Range: 70 - 99 mg/dL 153 (H)  BUN Latest Ref Range: 6 - 23 mg/dL 21  Creatinine Latest Ref Range: 0.40 - 1.50 mg/dL 1.00  Calcium Latest Ref Range: 8.4 - 10.5 mg/dL 9.5     ASSESSMENT / PLAN / RECOMMENDATIONS:   1)Type 2 Diabetes Mellitus, Poorly controlled With Neuropathic  complications - Most recent A1c of 8.5 %. Goal A1c <7.0 %.  Down from 10.3%  -In review of his glucose meter, patient was noted to have improved glycemic control  -Praised the patient on his compliance with medication intake and glucose checks - He continues to have higher fasting BG's then bedtime BG's, will increase soliqua as below   MEDICATIONS:  Continue metformin 1000 mg twice a day with meals  Continue Farxiga 10 mg once a day  Increase Soliqua to 21 units daily with breakfast   EDUCATION / INSTRUCTIONS:  BG monitoring instructions: Patient is instructed to check his blood sugars 2 times a day, fasting and bedtime.  Call [place] [facility] clinic if: BG persistently < 70 or > 300. . I reviewed the Rule of 15 for the treatment of hypoglycemia in detail with the patient. Literature supplied.   2) Diabetic complications:   Eye: Does not have known diabetic retinopathy.   Neuro/ Feet: Does have known diabetic peripheral neuropathy based on exam   Renal: Patient does not have known baseline CKD. He is not on an ACEI/ARB at present. I will defer annual urine microalbuminuria checks to his PCP, if abnormal, consider starting an ACEI/ARB if no contra-indications exist.    F/U in 3 months    Signed electronically by: Mack Guise, MD  [facility] Endocrinology  [facility] [address] [place], [place] [address] Phone: [phone] FAX: [phone]   CC: Sharilyn Sites, [place] [address] Phone: [phone]   Fax: [phone]  Return to Endocrinology clinic as below: Future Appointments  Date Time Provider [place]  09/11/2018  7:40 AM [facility] DEVICE REMOTES [facility]  09/28/2018  9:30 AM Shamleffer, Melanie Crazier, MD [facility] None

## 2018-07-11 DIAGNOSIS — I10 Essential (primary) hypertension: Secondary | ICD-10-CM | POA: Diagnosis not present

## 2018-07-11 DIAGNOSIS — E6609 Other obesity due to excess calories: Secondary | ICD-10-CM | POA: Diagnosis not present

## 2018-07-11 DIAGNOSIS — G8929 Other chronic pain: Secondary | ICD-10-CM | POA: Diagnosis not present

## 2018-07-11 DIAGNOSIS — N182 Chronic kidney disease, stage 2 (mild): Secondary | ICD-10-CM | POA: Diagnosis not present

## 2018-07-11 DIAGNOSIS — I4891 Unspecified atrial fibrillation: Secondary | ICD-10-CM | POA: Diagnosis not present

## 2018-07-11 DIAGNOSIS — Z950 Presence of cardiac pacemaker: Secondary | ICD-10-CM | POA: Diagnosis not present

## 2018-07-11 DIAGNOSIS — Z0001 Encounter for general adult medical examination with abnormal findings: Secondary | ICD-10-CM | POA: Diagnosis not present

## 2018-07-11 DIAGNOSIS — E1129 Type 2 diabetes mellitus with other diabetic kidney complication: Secondary | ICD-10-CM | POA: Diagnosis not present

## 2018-07-11 DIAGNOSIS — Z6834 Body mass index (BMI) 34.0-34.9, adult: Secondary | ICD-10-CM | POA: Diagnosis not present

## 2018-07-11 DIAGNOSIS — I5031 Acute diastolic (congestive) heart failure: Secondary | ICD-10-CM | POA: Diagnosis not present

## 2018-07-12 DIAGNOSIS — E785 Hyperlipidemia, unspecified: Secondary | ICD-10-CM | POA: Diagnosis not present

## 2018-07-12 DIAGNOSIS — Z125 Encounter for screening for malignant neoplasm of prostate: Secondary | ICD-10-CM | POA: Diagnosis not present

## 2018-07-12 DIAGNOSIS — Z0001 Encounter for general adult medical examination with abnormal findings: Secondary | ICD-10-CM | POA: Diagnosis not present

## 2018-07-30 ENCOUNTER — Other Ambulatory Visit: Payer: Self-pay | Admitting: Nurse Practitioner

## 2018-07-30 DIAGNOSIS — K7460 Unspecified cirrhosis of liver: Secondary | ICD-10-CM

## 2018-09-06 DIAGNOSIS — E782 Mixed hyperlipidemia: Secondary | ICD-10-CM | POA: Diagnosis not present

## 2018-09-06 DIAGNOSIS — E039 Hypothyroidism, unspecified: Secondary | ICD-10-CM | POA: Diagnosis not present

## 2018-09-06 DIAGNOSIS — N182 Chronic kidney disease, stage 2 (mild): Secondary | ICD-10-CM | POA: Diagnosis not present

## 2018-09-11 ENCOUNTER — Ambulatory Visit (INDEPENDENT_AMBULATORY_CARE_PROVIDER_SITE_OTHER): Payer: Medicare HMO | Admitting: *Deleted

## 2018-09-11 DIAGNOSIS — I495 Sick sinus syndrome: Secondary | ICD-10-CM

## 2018-09-11 LAB — CUP PACEART REMOTE DEVICE CHECK
Battery Remaining Longevity: 61 mo
Battery Remaining Percentage: 57 %
Battery Voltage: 2.89 V
Brady Statistic AP VP Percent: 1 %
Brady Statistic AP VS Percent: 70 %
Brady Statistic AS VP Percent: 1 %
Brady Statistic AS VS Percent: 30 %
Brady Statistic RA Percent Paced: 69 %
Brady Statistic RV Percent Paced: 1 %
Date Time Interrogation Session: 20200602114712
Implantable Lead Implant Date: 20130920
Implantable Lead Implant Date: 20130920
Implantable Lead Location: 753859
Implantable Lead Location: 753860
Implantable Pulse Generator Implant Date: 20130920
Lead Channel Impedance Value: 360 Ohm
Lead Channel Impedance Value: 430 Ohm
Lead Channel Pacing Threshold Amplitude: 0.5 V
Lead Channel Pacing Threshold Amplitude: 0.75 V
Lead Channel Pacing Threshold Pulse Width: 0.4 ms
Lead Channel Pacing Threshold Pulse Width: 1 ms
Lead Channel Sensing Intrinsic Amplitude: 4 mV
Lead Channel Sensing Intrinsic Amplitude: 4.2 mV
Lead Channel Setting Pacing Amplitude: 2 V
Lead Channel Setting Pacing Amplitude: 2.5 V
Lead Channel Setting Pacing Pulse Width: 1 ms
Lead Channel Setting Sensing Sensitivity: 1 mV
Pulse Gen Model: 2210
Pulse Gen Serial Number: 7393982

## 2018-09-19 ENCOUNTER — Encounter: Payer: Self-pay | Admitting: Cardiology

## 2018-09-19 NOTE — Progress Notes (Signed)
Remote pacemaker transmission.   

## 2018-09-28 ENCOUNTER — Other Ambulatory Visit: Payer: Self-pay

## 2018-09-28 ENCOUNTER — Ambulatory Visit: Payer: Medicare HMO | Admitting: Internal Medicine

## 2018-09-28 ENCOUNTER — Encounter: Payer: Self-pay | Admitting: Internal Medicine

## 2018-09-28 VITALS — BP 120/78 | HR 72 | Ht 70.0 in | Wt 226.0 lb

## 2018-09-28 DIAGNOSIS — E1142 Type 2 diabetes mellitus with diabetic polyneuropathy: Secondary | ICD-10-CM

## 2018-09-28 LAB — POCT GLYCOSYLATED HEMOGLOBIN (HGB A1C): Hemoglobin A1C: 7.4 % — AB (ref 4.0–5.6)

## 2018-09-28 NOTE — Patient Instructions (Signed)
-   Increase Soliqua to 26 units daily with breakfast  - Continue Metformin 1000 mg twice a day  - Continue Farxiga 10 mg daily       -HOW TO TREAT LOW BLOOD SUGARS (Blood sugar LESS THAN 70 MG/DL)  Please follow the RULE OF 15 for the treatment of hypoglycemia treatment (when your (blood sugars are less than 70 mg/dL)    STEP 1: Take 15 grams of carbohydrates when your blood sugar is low, which includes:   3-4 GLUCOSE TABS  OR  3-4 OZ OF JUICE OR REGULAR SODA OR  ONE TUBE OF GLUCOSE GEL     STEP 2: RECHECK blood sugar in 15 MINUTES STEP 3: If your blood sugar is still low at the 15 minute recheck --> then, go back to STEP 1 and treat AGAIN with another 15 grams of carbohydrates.

## 2018-09-28 NOTE — Progress Notes (Signed)
Name: Ralph [name]  Age/ Sex: 68 y.o., male   MRN/ DOB: [id number], [birth year]     PCP: Sharilyn Sites, MD   Reason for Endocrinology Evaluation: Type 2 Diabetes Mellitus  Initial Endocrine Consultative Visit: 04/18/2018    PATIENT IDENTIFIER: [name] is a 68 y.o. male with a past medical history of A.Fib (S/P Pacemaker), CHF, HTN, Hypothyroidism and T2DM. The patient has followed with Endocrinology clinic since 04/18/2017 for consultative assistance with management of his diabetes.  DIABETIC HISTORY:  Mr. Abdallah was diagnosed with T2DM in ~ 2009, he has been on oral glycemic agents for years, initially was only on metformin followed by farxiga and levemir added ~ 6 yrs ago but was unable to afford it. His hemoglobin A1c has ranged from 5.7% in 12/2015, peaking at 7.5% in 06/2015.   SUBJECTIVE:   During the last visit (06/28/18): We increased soliqua to 23 units, continued metformin and Farxiga     Today (09/28/2018): Mr. Jungwirth is here for 3 month  follow-up appointment on diabetes management He checks his blood sugars 2 times daily, fasting and bedtime. The patient has not had hypoglycemic episodes since the last clinic visit. Otherwise, the patient has not required any recent emergency interventions for hypoglycemia and has not had recent hospitalizations secondary to hyper or hypoglycemic episodes.    ROS: As per HPI and as detailed below: Review of Systems  Constitutional: Negative for fever and weight loss.  Respiratory: Negative for cough and shortness of breath.   Cardiovascular: Negative for chest pain and palpitations.  Gastrointestinal: Positive for nausea. Negative for diarrhea and vomiting.      HOME DIABETES REGIMEN:  - Metformin 1000 mg Twice a day with meals  - Farxiga 10 mg once a day  - Soliqua 23 units daily     METER DOWNLOAD SUMMARY: Date range evaluated: 6/6-6/19/20  Fingerstick Blood Glucose Tests = 25 Overall Mean FS Glucose = 180.8 Standard Deviation = 27.9  BG Ranges: Low = 130 High = 257   Hypoglycemic Events/30 Days: BG < 50 =0 Episodes of symptomatic severe hypoglycemia = 0  DIABETIC COMPLICATIONS: Microvascular complications:   Neuropathy  Denies: retinopathy   Last eye exam: Completed 04/2016  Macrovascular complications:   CHF   Denies: CAD, PVD, CVA  HISTORY:  Past Medical History:  Past Medical History:  Diagnosis Date  . Anginal pain ([place])   . Arthritis    "back; fingers" (01/06/2012)  . Atrial flutter [facility])    s/p EPS +RF ablation of typical atrial flutter April 2015  . Cancer ([place])    skin cancer  . CHB (complete heart block) ([place])   . CHF (congestive heart failure) ([place]) 01/06/2012  . Chronic lower back pain   . 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 Coughing up blood    "comes from my throat" (01/06/2012)  . DDD (degenerative disc disease), lumbar   . Depressed   . Difficult intubation    Eschmann stylet used in 2002 and 2007; "trouble waking up afterwards" (01/06/2012)  . Emphysema   . Fatty liver disease, nonalcoholic   . GERD (gastroesophageal reflux disease)   . H/O hiatal hernia   . Headache   . History of esophageal varices   . Hypertension   . Hypothyroidism   . Orthostatic dizziness   . Pacemaker   . Pneumonia Aug 2016  . Presence of permanent cardiac pacemaker 9/292013   St.Jude  . Sinus pause  01/06/2012   5.2 seconds  . Sleep apnea    "don't wear mask" (01/06/2012)  . Stroke [facility])    pt states that he might have had a stroke not sure  . Type II diabetes mellitus ([place])   . Varicose vein    of esophagus   Past Surgical History:  Past Surgical History:  Procedure Laterality Date  . ATRIAL FLUTTER ABLATION N/A 07/10/2013   Procedure: ATRIAL FLUTTER ABLATION;  Surgeon: Evans Lance, MD;  Location: [facility] CATH LAB;  Service: Cardiovascular;  Laterality: N/A;  . BACK SURGERY    .  CHOLECYSTECTOMY  1993  . COLONOSCOPY  11/08/2004   YIF:OYDXAJ rectum, colon, TI.  Marland Kitchen COLONOSCOPY N/A 05/28/2014   Dr. Gala Romney: Redundant colon. single colonic polyp removed as described above. Tubular adenoma  . ESOPHAGEAL DILATION N/A 05/28/2014   Procedure: ESOPHAGEAL DILATION;  Surgeon: Daneil Dolin, MD;  [facility];  Service: Endoscopy;  Laterality: N/A;  . ESOPHAGOGASTRODUODENOSCOPY  11/08/2004   OIN:OMVEHM esophageal erosions consistent with erosive reflux esophagitis/Areas of hemorrhage and nodularity of the fundal mucosa of uncertain significance, biopsied.  Small hiatal hernia, otherwise normal stomach  . ESOPHAGOGASTRODUODENOSCOPY  2010   Dr. Gala Romney: 3 columns Grade 1 varices, erosive esophagitis, HH, portal gastropathy, normal D1, D2  . ESOPHAGOGASTRODUODENOSCOPY N/A 05/28/2014   Dr. Gala Romney: MIld erosive reflux esophagitis. Grade 1 esophageal varices. Patent esophagus. No dilation performed. Hiatal hernia.   Marland Kitchen ESOPHAGOGASTRODUODENOSCOPY (EGD) WITH ESOPHAGEAL DILATION N/A 02/14/2013   CNO:BSJGG 1 esophageal varices. Abnormal distal esophagus/status post biopsy after Maloney dilation. Portal gastropathy. Antral erosions-status post biopsy. path negative for H.pylori, benign path.  . LUMBAR [place]; ~ 1995; ~ 1996  . NASAL SEPTUM SURGERY  1992  . nuclear stress test  10/19/2004   No ischemia  . PERMANENT PACEMAKER INSERTION  01/08/2012   CHB  . PERMANENT PACEMAKER INSERTION N/A 01/09/2012   Procedure: PERMANENT PACEMAKER INSERTION;  Surgeon: Sanda Klein, MD;  Location: [place] CATH LAB;  Service: Cardiovascular;  Laterality: N/A;  . POSTERIOR FUSION LUMBAR SPINE  1999   L4-5  . SPINAL CORD STIMULATOR IMPLANT  2006  . SPINAL CORD STIMULATOR REMOVAL N/A 01/27/2015   Procedure: LUMBAR SPINAL CORD STIMULATOR REMOVAL;  Surgeon: Kristeen Miss, MD;  Location: [facility] NEURO ORS;  Service: Neurosurgery;  Laterality: N/A;  LUMBAR SPINAL CORD STIMULATOR REMOVAL  . TONSILLECTOMY AND  ADENOIDECTOMY  1992  . US ECHOCARDIOGRAPHY  12/28/2011   mild LVH,mild mitral annulara ca+,mild MR  . Warthin's tumor excision  1990's   right    Social History:  reports that he quit smoking about 4 years ago. His smoking use included cigarettes. He started smoking about 50 years ago. He has a 11.25 pack-year smoking history. He has never used smokeless tobacco. He reports that he does not drink alcohol or use drugs. Family History:  Family History  Problem Relation Age of Onset  . Cancer Mother        Deceased, 52  . Ovarian cancer Mother   . Arrhythmia Father   . Other Father        Deceased 40  . Stroke Brother   . Stroke Brother   . Stroke Sister   . Crohn's disease Daughter   . Diabetes Maternal Grandmother      HOME MEDICATIONS: Allergies as of 09/28/2018      Reactions   Nitroglycerin Hives, Swelling, Rash      Medication List       Accurate  as of September 28, 2018  4:27 PM. If you have any questions, ask your nurse or doctor.        dapagliflozin propanediol 10 MG Tabs tablet Commonly known as: Farxiga Take 10 mg by mouth daily.   diltiazem 180 MG 24 hr capsule Commonly known as: CARDIZEM CD TAKE 1 CAPSULE BY MOUTH EVERY DAY   Fish Oil 1200 MG Caps Take by mouth 2 (two) times daily.   flecainide 100 MG tablet Commonly known as: TAMBOCOR TAKE 1 TABLET BY MOUTH TWICE A DAY   furosemide 20 MG tablet Commonly known as: LASIX TAKE 2 TABLETS BY MOUTH DAILY AS NEEDED FOR SWELLING   Insulin Glargine-Lixisenatide 100-33 UNT-MCG/ML Sopn Commonly known as: Soliqua Inject 21 Units into the skin daily with breakfast.   Insulin Pen Needle 32G X 6 MM Misc Commonly known as: BD Pen Needle Micro U/F Daily   levothyroxine 112 MCG tablet Commonly known as: SYNTHROID TAKE 1 TABLET BY MOUTH EVERY MORNING BEFORE BREAKFAST What changed: See the new instructions.   metFORMIN 1000 MG tablet Commonly known as: GLUCOPHAGE TAKE 1 TABLET BY MOUTH TWICE A DAY   nadolol  40 MG tablet Commonly known as: CORGARD TAKE 1 TABLET IN THE MORNING AND 1/2 TABLET BY MOUTH EVERY EVENING   OneTouch Verio test strip Generic drug: glucose blood USE TO TEST BLOOD SUGAR FOUR TIMES A DAY   pantoprazole 40 MG tablet Commonly known as: PROTONIX TAKE 1 TABLET (40 MG TOTAL) BY MOUTH DAILY.   propranolol 20 MG tablet Commonly known as: INDERAL TAKE 1 TABLET BY MOUTH TWICE A DAY   rivaroxaban 20 MG Tabs tablet Commonly known as: Xarelto TAKE 1 TABLET BY MOUTH EVERY DAY WITH DINNER   simvastatin 20 MG tablet Commonly known as: ZOCOR TAKE 1 TABLET BY MOUTH EVERY DAY IN THE EVENING   spironolactone 50 MG tablet Commonly known as: ALDACTONE TAKE 1 TABLET BY MOUTH TWICE A DAY        OBJECTIVE:   Vital Signs: BP 120/78   Pulse 72   Ht 5\' 10"  (1.778 m)   Wt 226 lb (102.5 kg)   SpO2 95%   BMI 32.43 kg/m   Wt Readings from Last 3 Encounters:  09/28/18 226 lb (102.5 kg)  06/28/18 227 lb 12.8 oz (103.3 kg)  05/17/18 222 lb 6.4 oz (100.9 kg)     Exam: General: Pt appears well and is in NAD  Lungs: Clear with good BS bilat with no rales, rhonchi, or wheezes  Heart: RRR with normal S1 and S2 and no gallops; no murmurs; no rub  Abdomen: Normoactive bowel sounds, soft, nontender, without masses or organomegaly palpable  Extremities: No pretibial edema.   Neuro: MS is good with appropriate affect, pt is alert and Ox3   DM foot exam: 04/18/18   The skin of the feet is intact without sores or ulcerations. Multiple plantar callous formation noted The pedal pulses are 2+ on right and 2+ on left. The sensation is decreased to a screening 5.07, 10 gram monofilament bilaterally    DATA REVIEWED:      Results for FIRMAN, PETROW (MRN [id number]) as of 06/29/2018 09:40  Ref. Range 06/28/2018 09:43  Sodium Latest Ref Range: 135 - 145 mEq/L 137  Potassium Latest Ref Range: 3.5 - 5.1 mEq/L 4.5  Chloride Latest Ref Range: 96 - 112 mEq/L 99  CO2 Latest Ref Range: 19 - 32  mEq/L 30  Glucose Latest Ref Range: 70 - 99 mg/dL 153 (  H)  BUN Latest Ref Range: 6 - 23 mg/dL 21  Creatinine Latest Ref Range: 0.40 - 1.50 mg/dL 1.00  Calcium Latest Ref Range: 8.4 - 10.5 mg/dL 9.5     ASSESSMENT / PLAN / RECOMMENDATIONS:   1)Type 2 Diabetes Mellitus, Sub-Optimally  controlled With Neuropathic  complications - Most recent A1c of 7.4 %. Goal A1c <7.0 %.  Down from 8.4   - Praised the patient on improvement in glucose control. And encouraged him to continue with lifestyle changes.  - He continues to have higher fasting BG's then bedtime BG's, will increase soliqua as below   MEDICATIONS:  Continue metformin 1000 mg twice a day with meals  Continue Farxiga 10 mg once a day  Increase Soliqua to 26 units daily with breakfast   EDUCATION / INSTRUCTIONS:  BG monitoring instructions: Patient is instructed to check his blood sugars 2 times a day, fasting and bedtime.  Call [place] [facility] clinic if: BG persistently < 70 or > 300. . I reviewed the Rule of 15 for the treatment of hypoglycemia in detail with the patient. Literature supplied.    F/U in 3 months    Signed electronically by: Mack Guise, MD  [facility] Endocrinology  [facility] [address] [place], [place] [address] Phone: [phone] FAX: [phone]   CC: Sharilyn Sites, [place] [address] Phone: [phone]  Fax: [phone]  Return to Endocrinology clinic as below: Future Appointments  Date Time Provider [place]  12/11/2018  7:55 AM [facility] DEVICE REMOTES [facility]  12/31/2018  9:30 AM Deshauna Cayson, Melanie Crazier, MD [facility] None

## 2018-10-09 DIAGNOSIS — E039 Hypothyroidism, unspecified: Secondary | ICD-10-CM | POA: Diagnosis not present

## 2018-10-09 DIAGNOSIS — E1129 Type 2 diabetes mellitus with other diabetic kidney complication: Secondary | ICD-10-CM | POA: Diagnosis not present

## 2018-10-09 DIAGNOSIS — E782 Mixed hyperlipidemia: Secondary | ICD-10-CM | POA: Diagnosis not present

## 2018-11-08 DIAGNOSIS — E1129 Type 2 diabetes mellitus with other diabetic kidney complication: Secondary | ICD-10-CM | POA: Diagnosis not present

## 2018-11-08 DIAGNOSIS — E782 Mixed hyperlipidemia: Secondary | ICD-10-CM | POA: Diagnosis not present

## 2018-11-08 DIAGNOSIS — E039 Hypothyroidism, unspecified: Secondary | ICD-10-CM | POA: Diagnosis not present

## 2018-11-20 ENCOUNTER — Other Ambulatory Visit: Payer: Self-pay

## 2018-11-21 MED ORDER — SPIRONOLACTONE 50 MG PO TABS: 50.0000 mg | ORAL_TABLET | Freq: Two times a day (BID) | ORAL | 1 refills | Status: DC

## 2018-11-21 NOTE — Telephone Encounter (Signed)
Lmom, waiting on a return call.  

## 2018-11-21 NOTE — Telephone Encounter (Signed)
We have not seen patient in 2 years. I am refilling with one month supply but needs office visit.

## 2018-12-11 ENCOUNTER — Ambulatory Visit (INDEPENDENT_AMBULATORY_CARE_PROVIDER_SITE_OTHER): Payer: Medicare HMO | Admitting: *Deleted

## 2018-12-11 DIAGNOSIS — I471 Supraventricular tachycardia, unspecified: Secondary | ICD-10-CM

## 2018-12-11 DIAGNOSIS — I4891 Unspecified atrial fibrillation: Secondary | ICD-10-CM

## 2018-12-11 LAB — CUP PACEART REMOTE DEVICE CHECK
Battery Remaining Longevity: 55 mo
Battery Remaining Percentage: 51 %
Battery Voltage: 2.87 V
Brady Statistic AP VP Percent: 1 %
Brady Statistic AP VS Percent: 69 %
Brady Statistic AS VP Percent: 1 %
Brady Statistic AS VS Percent: 30 %
Brady Statistic RA Percent Paced: 69 %
Brady Statistic RV Percent Paced: 1 %
Date Time Interrogation Session: 20200901113700
Implantable Lead Implant Date: 20130920
Implantable Lead Implant Date: 20130920
Implantable Lead Location: 753859
Implantable Lead Location: 753860
Implantable Pulse Generator Implant Date: 20130920
Lead Channel Impedance Value: 340 Ohm
Lead Channel Impedance Value: 440 Ohm
Lead Channel Pacing Threshold Amplitude: 0.5 V
Lead Channel Pacing Threshold Amplitude: 0.75 V
Lead Channel Pacing Threshold Pulse Width: 0.4 ms
Lead Channel Pacing Threshold Pulse Width: 1 ms
Lead Channel Sensing Intrinsic Amplitude: 2 mV
Lead Channel Sensing Intrinsic Amplitude: 3.2 mV
Lead Channel Setting Pacing Amplitude: 2 V
Lead Channel Setting Pacing Amplitude: 2.5 V
Lead Channel Setting Pacing Pulse Width: 1 ms
Lead Channel Setting Sensing Sensitivity: 1 mV
Pulse Gen Model: 2210
Pulse Gen Serial Number: 7393982

## 2018-12-26 NOTE — Progress Notes (Signed)
Remote pacemaker transmission.   

## 2018-12-27 ENCOUNTER — Other Ambulatory Visit: Payer: Self-pay

## 2018-12-31 ENCOUNTER — Other Ambulatory Visit: Payer: Self-pay

## 2018-12-31 ENCOUNTER — Ambulatory Visit (INDEPENDENT_AMBULATORY_CARE_PROVIDER_SITE_OTHER): Payer: Medicare HMO | Admitting: Internal Medicine

## 2018-12-31 ENCOUNTER — Encounter: Payer: Self-pay | Admitting: Internal Medicine

## 2018-12-31 VITALS — BP 124/64 | HR 60 | Temp 98.3°F | Ht 70.0 in | Wt 223.6 lb

## 2018-12-31 DIAGNOSIS — R69 Illness, unspecified: Secondary | ICD-10-CM | POA: Diagnosis not present

## 2018-12-31 DIAGNOSIS — E1142 Type 2 diabetes mellitus with diabetic polyneuropathy: Secondary | ICD-10-CM | POA: Diagnosis not present

## 2018-12-31 LAB — POCT GLYCOSYLATED HEMOGLOBIN (HGB A1C): Hemoglobin A1C: 7.6 % — AB (ref 4.0–5.6)

## 2018-12-31 MED ORDER — METFORMIN HCL 1000 MG PO TABS: 1000.0000 mg | ORAL_TABLET | Freq: Two times a day (BID) | ORAL | 3 refills | Status: DC

## 2018-12-31 MED ORDER — SOLIQUA 100-33 UNT-MCG/ML ~~LOC~~ SOPN: 32.0000 [IU] | PEN_INJECTOR | Freq: Every day | SUBCUTANEOUS | 6 refills | Status: DC

## 2018-12-31 MED ORDER — ONETOUCH VERIO VI STRP: 1.0000 | ORAL_STRIP | Freq: Two times a day (BID) | 6 refills | Status: DC

## 2018-12-31 NOTE — Progress Notes (Signed)
Name: Ralph [name]  Age/ Sex: 68 y.o., male   MRN/ DOB: [id number], 03/22/1951     PCP: Sharilyn Sites, MD   Reason for Endocrinology Evaluation: Type 2 Diabetes Mellitus  Initial Endocrine Consultative Visit: 04/18/2018    PATIENT IDENTIFIER: Ralph [name] is a 68 y.o. male with a past medical history of A.Fib (S/P Pacemaker), CHF, HTN, Hypothyroidism and T2DM. The patient has followed with Endocrinology clinic since 04/18/2017 for consultative assistance with management of his diabetes.  DIABETIC HISTORY:  Ralph [name] was diagnosed with T2DM in ~ 2009, he has been on oral glycemic agents for years, initially was only on metformin followed by farxiga and levemir added ~ 6 yrs ago but was unable to afford it. His hemoglobin A1c has ranged from 5.7% in 12/2015, peaking at 7.5% in 06/2015.   SUBJECTIVE:   During the last visit (09/28/18): We increased soliqua to 23 units, continued metformin and [place]     Today (12/31/2018): [name] is here for 3 month  follow-up appointment on diabetes management He checks his blood sugars 2 times daily, fasting and bedtime. The patient has not had hypoglycemic episodes since the last clinic visit. Otherwise, the patient has not required any recent emergency interventions for hypoglycemia and has not had recent hospitalizations secondary to hyper or hypoglycemic episodes.    ROS: As per HPI and as detailed below: Review of Systems  Constitutional: Negative for fever and weight loss.  Respiratory: Negative for cough and shortness of breath.   Cardiovascular: Negative for chest pain and palpitations.  Gastrointestinal: Positive for nausea. Negative for diarrhea and vomiting.      HOME DIABETES REGIMEN:  - Metformin 1000 mg Twice a day with meals  - Farxiga 10 mg once a day  - Soliqua 26 units daily     METER DOWNLOAD SUMMARY: Date range evaluated: 9/8-9/21/20  Fingerstick Blood Glucose Tests = 26 Overall Mean FS Glucose = 190 Glucose Checks per Day = 1.9  BG Ranges: Low = 136 High = 267   Hypoglycemic Events/30 Days: BG < 50 =0 Episodes of symptomatic severe hypoglycemia = 0  DIABETIC COMPLICATIONS: Microvascular complications:   Neuropathy  Denies: retinopathy   Last eye exam: Completed 04/2016  Macrovascular complications:   CHF   Denies: CAD, PVD, CVA  HISTORY:  Past Medical History:  Past Medical History:  Diagnosis Date  . Anginal pain ([place])   . Arthritis    "back; fingers" (01/06/2012)  . Atrial flutter [facility])    s/p EPS +RF ablation of typical atrial flutter April 2015  . Cancer ([place])    skin cancer  . CHB (complete heart block) ([place])   . CHF (congestive heart failure) ([place]) 01/06/2012  . Chronic lower back pain   . 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 Coughing up blood    "comes from my throat" (01/06/2012)  . DDD (degenerative disc disease), lumbar   . Depressed   . Difficult intubation    Eschmann stylet used in 2002 and 2007; "trouble waking up afterwards" (01/06/2012)  . Emphysema   . Fatty liver disease, nonalcoholic   . GERD (gastroesophageal reflux disease)   . H/O hiatal hernia   . Headache   . History of esophageal varices   . Hypertension   . Hypothyroidism   . Orthostatic dizziness   . Pacemaker   . Pneumonia Aug 2016  . Presence of permanent cardiac pacemaker 9/292013   [place]  .  Sinus pause 01/06/2012   5.2 seconds  . Sleep apnea    "don't wear mask" (01/06/2012)  . Stroke [facility])    pt states that he might have had a stroke not sure  . Type II diabetes mellitus ([place])   . Varicose vein    of esophagus   Past Surgical History:  Past Surgical History:  Procedure Laterality Date  . ATRIAL FLUTTER ABLATION N/A 07/10/2013   Procedure: ATRIAL FLUTTER ABLATION;  Surgeon: Evans Lance, MD;  Location: [facility] CATH LAB;  Service: Cardiovascular;  Laterality: N/A;  . BACK SURGERY    .  CHOLECYSTECTOMY  1993  . COLONOSCOPY  11/08/2004   [id number] rectum, colon, TI.  Marland Kitchen COLONOSCOPY N/A 05/28/2014   Dr. Gala Romney: Redundant colon. single colonic polyp removed as described above. Tubular adenoma  . ESOPHAGEAL DILATION N/A 05/28/2014   Procedure: ESOPHAGEAL DILATION;  Surgeon: Daneil Dolin, MD;  Location[facility]O [facility];  Service: Endoscopy;  Laterality: N/A;  . ESOPHAGOGASTRODUODENOSCOPY  11/08/2004   [id number] esophageal erosions consistent with erosive reflux esophagitis/Areas of hemorrhage and nodularity of the fundal mucosa of uncertain significance, biopsied.  Small hiatal hernia, otherwise normal stomach  . ESOPHAGOGASTRODUODENOSCOPY  2010   Dr. Gala Romney: 3 columns Grade 1 varices, erosive esophagitis, HH, portal gastropathy, normal D1, D2  . ESOPHAGOGASTRODUODENOSCOPY N/A 05/28/2014   Dr. Gala Romney: MIld erosive reflux esophagitis. Grade 1 esophageal varices. Patent esophagus. No dilation performed. Hiatal hernia.   Marland Kitchen ESOPHAGOGASTRODUODENOSCOPY (EGD) WITH ESOPHAGEAL DILATION N/A 02/14/2013   [id number] 1 esophageal varices. Abnormal distal esophagus/status post biopsy after Maloney dilation. Portal gastropathy. Antral erosions-status post biopsy. path negative for H.pylori, benign path.  . LUMBAR [place]; ~ 1995; ~ 1996  . NASAL SEPTUM SURGERY  1992  . nuclear stress test  10/19/2004   No ischemia  . PERMANENT PACEMAKER INSERTION  01/08/2012   CHB  . PERMANENT PACEMAKER INSERTION N/A 01/09/2012   Procedure: PERMANENT PACEMAKER INSERTION;  Surgeon: Sanda Klein, MD;  Location: [facility] CATH LAB;  Service: Cardiovascular;  Laterality: N/A;  . POSTERIOR FUSION LUMBAR SPINE  1999   L4-5  . SPINAL CORD STIMULATOR IMPLANT  2006  . SPINAL CORD STIMULATOR REMOVAL N/A 01/27/2015   Procedure: LUMBAR SPINAL CORD STIMULATOR REMOVAL;  Surgeon: Kristeen Miss, MD;  Location: [place] NEURO ORS;  Service: Neurosurgery;  Laterality: N/A;  LUMBAR SPINAL CORD STIMULATOR REMOVAL  . TONSILLECTOMY AND  ADENOIDECTOMY  1992  . US ECHOCARDIOGRAPHY  12/28/2011   mild LVH,mild mitral annulara ca+,mild MR  . Warthin's tumor excision  1990's   right    Social History:  reports that he quit smoking about 4 years ago. His smoking use included cigarettes. He started smoking about 50 years ago. He has a 11.25 pack-year smoking history. He has never used smokeless tobacco. He reports that he does not drink alcohol or use drugs. Family History:  Family History  Problem Relation Age of Onset  . Cancer Mother        Deceased, 61  . Ovarian cancer Mother   . Arrhythmia Father   . Other Father        Deceased 75  . Stroke Brother   . Stroke Brother   . Stroke Sister   . Crohn's disease Daughter   . Diabetes Maternal Grandmother      HOME MEDICATIONS: Allergies as of 12/31/2018      Reactions   Nitroglycerin Hives, Swelling, Rash      Medication List  Accurate as of December 31, 2018  9:36 AM. If you have any questions, ask your nurse or doctor.        dapagliflozin propanediol 10 MG Tabs tablet Commonly known as: Farxiga Take 10 mg by mouth daily.   diltiazem 180 MG 24 hr capsule Commonly known as: CARDIZEM CD TAKE 1 CAPSULE BY MOUTH EVERY DAY   Fish Oil 1200 MG Caps Take by mouth 2 (two) times daily.   flecainide 100 MG tablet Commonly known as: TAMBOCOR TAKE 1 TABLET BY MOUTH TWICE A DAY   furosemide 20 MG tablet Commonly known as: LASIX TAKE 2 TABLETS BY MOUTH DAILY AS NEEDED FOR SWELLING   Insulin Glargine-Lixisenatide 100-33 UNT-MCG/ML Sopn Commonly known as: Soliqua Inject 21 Units into the skin daily with breakfast. What changed: how much to take   Insulin Pen Needle 32G X 6 MM Misc Commonly known as: BD Pen Needle Micro U/F Daily   levothyroxine 112 MCG tablet Commonly known as: SYNTHROID TAKE 1 TABLET BY MOUTH EVERY MORNING BEFORE BREAKFAST What changed: See the new instructions.   metFORMIN 1000 MG tablet Commonly known as: GLUCOPHAGE TAKE 1  TABLET BY MOUTH TWICE A DAY   nadolol 40 MG tablet Commonly known as: CORGARD TAKE 1 TABLET IN THE MORNING AND 1/2 TABLET BY MOUTH EVERY EVENING   OneTouch Verio test strip Generic drug: glucose blood USE TO TEST BLOOD SUGAR FOUR TIMES A DAY   pantoprazole 40 MG tablet Commonly known as: PROTONIX TAKE 1 TABLET (40 MG TOTAL) BY MOUTH DAILY.   propranolol 20 MG tablet Commonly known as: INDERAL TAKE 1 TABLET BY MOUTH TWICE A DAY   rivaroxaban 20 MG Tabs tablet Commonly known as: Xarelto TAKE 1 TABLET BY MOUTH EVERY DAY WITH DINNER   simvastatin 20 MG tablet Commonly known as: ZOCOR TAKE 1 TABLET BY MOUTH EVERY DAY IN THE EVENING   spironolactone 50 MG tablet Commonly known as: ALDACTONE Take 1 tablet (50 mg total) by mouth 2 (two) times daily.        OBJECTIVE:   Vital Signs: BP 124/64 (BP Location: Right Arm, Patient Position: Sitting, Cuff Size: Normal)   Pulse 60   Temp 98.3 F (36.8 C)   Ht 5\' 10"  (1.778 m)   Wt 223 lb 9.6 oz (101.4 kg)   SpO2 98%   BMI 32.08 kg/m   Wt Readings from Last 3 Encounters:  12/31/18 223 lb 9.6 oz (101.4 kg)  09/28/18 226 lb (102.5 kg)  06/28/18 227 lb 12.8 oz (103.3 kg)   Exam: General: Pt appears well and is in NAD  Lungs: Clear with good BS bilat with no rales, rhonchi, or wheezes  Heart: RRR with normal S1 and S2 and no gallops; no murmurs; no rub  Abdomen: Normoactive bowel sounds, soft, nontender, without masses or organomegaly palpable  Extremities: No pretibial edema.   Neuro: MS is good with appropriate affect, pt is alert and Ox3   DM foot exam: 04/18/18   The skin of the feet is intact without sores or ulcerations. Multiple plantar callous formation noted The pedal pulses are 2+ on right and 2+ on left. The sensation is decreased to a screening 5.07, 10 gram monofilament bilaterally    DATA REVIEWED:      Results for RAYMON, [place] (MRN [id number]) as of 06/29/2018 09:40  Ref. Range 06/28/2018 09:43  Sodium  Latest Ref Range: 135 - 145 mEq/L 137  Potassium Latest Ref Range: 3.5 - 5.1 mEq/L 4.5  Chloride  Latest Ref Range: 96 - 112 mEq/L 99  CO2 Latest Ref Range: 19 - 32 mEq/L 30  Glucose Latest Ref Range: 70 - 99 mg/dL 153 (H)  BUN Latest Ref Range: 6 - 23 mg/dL 21  Creatinine Latest Ref Range: 0.40 - 1.50 mg/dL 1.00  Calcium Latest Ref Range: 8.4 - 10.5 mg/dL 9.5     ASSESSMENT / PLAN / RECOMMENDATIONS:   1)Type 2 Diabetes Mellitus, Sub-Optimally  controlled With Neuropathic  complications - Most recent A1c of 7.6 %. Goal A1c <7.0 %.  Down from 8.4  - A1c stable  - Praised pt on the glucose checks - Will adjust his medication as below   MEDICATIONS:  Continue metformin 1000 mg twice a day with meals  Continue Farxiga 10 mg once a day  Increase Soliqua to 32 units daily with breakfast   EDUCATION / INSTRUCTIONS:  BG monitoring instructions: Patient is instructed to check his blood sugars 2 times a day, fasting and bedtime.  Call [place] [facility] clinic if: BG persistently < 70 or > 300. . I reviewed the Rule of 15 for the treatment of hypoglycemia in detail with the patient. Literature supplied.    F/U in 3 months    Signed electronically by: Mack Guise, MD  [facility]  [facility] [address] [place], [place] [address] Phone: [phone] FAX: [phone]   CC: Sharilyn Sites, [place] [id number] Phone: [phone]  Fax: [phone]  Return to Endocrinology clinic as below: Future Appointments  Date Time Provider [place]  03/12/2019  7:50 AM [facility] DEVICE REMOTES [facility]  06/11/2019  7:50 AM [facility] DEVICE REMOTES [facility]  09/10/2019  7:50 AM [facility] DEVICE REMOTES [facility]

## 2018-12-31 NOTE — Patient Instructions (Signed)
-   Increase Soliqua to 32 units daily with breakfast  - Continue Metformin 1000 mg twice a day  - Continue Farxiga 10 mg daily       -HOW TO TREAT LOW BLOOD SUGARS (Blood sugar LESS THAN 70 MG/DL)  Please follow the RULE OF 15 for the treatment of hypoglycemia treatment (when your (blood sugars are less than 70 mg/dL)    STEP 1: Take 15 grams of carbohydrates when your blood sugar is low, which includes:   3-4 GLUCOSE TABS  OR  3-4 OZ OF JUICE OR REGULAR SODA OR  ONE TUBE OF GLUCOSE GEL     STEP 2: RECHECK blood sugar in 15 MINUTES STEP 3: If your blood sugar is still low at the 15 minute recheck --> then, go back to STEP 1 and treat AGAIN with another 15 grams of carbohydrates.

## 2019-01-09 DIAGNOSIS — J449 Chronic obstructive pulmonary disease, unspecified: Secondary | ICD-10-CM | POA: Diagnosis not present

## 2019-01-09 DIAGNOSIS — E1129 Type 2 diabetes mellitus with other diabetic kidney complication: Secondary | ICD-10-CM | POA: Diagnosis not present

## 2019-01-09 DIAGNOSIS — N182 Chronic kidney disease, stage 2 (mild): Secondary | ICD-10-CM | POA: Diagnosis not present

## 2019-01-09 DIAGNOSIS — I10 Essential (primary) hypertension: Secondary | ICD-10-CM | POA: Diagnosis not present

## 2019-01-15 DIAGNOSIS — Z23 Encounter for immunization: Secondary | ICD-10-CM | POA: Diagnosis not present

## 2019-01-15 DIAGNOSIS — I4891 Unspecified atrial fibrillation: Secondary | ICD-10-CM | POA: Diagnosis not present

## 2019-01-15 DIAGNOSIS — E7849 Other hyperlipidemia: Secondary | ICD-10-CM | POA: Diagnosis not present

## 2019-01-15 DIAGNOSIS — K760 Fatty (change of) liver, not elsewhere classified: Secondary | ICD-10-CM | POA: Diagnosis not present

## 2019-01-15 DIAGNOSIS — R69 Illness, unspecified: Secondary | ICD-10-CM | POA: Diagnosis not present

## 2019-01-15 DIAGNOSIS — I471 Supraventricular tachycardia: Secondary | ICD-10-CM | POA: Diagnosis not present

## 2019-01-15 DIAGNOSIS — Z6833 Body mass index (BMI) 33.0-33.9, adult: Secondary | ICD-10-CM | POA: Diagnosis not present

## 2019-01-15 DIAGNOSIS — I10 Essential (primary) hypertension: Secondary | ICD-10-CM | POA: Diagnosis not present

## 2019-01-15 DIAGNOSIS — E039 Hypothyroidism, unspecified: Secondary | ICD-10-CM | POA: Diagnosis not present

## 2019-01-20 ENCOUNTER — Other Ambulatory Visit: Payer: Self-pay | Admitting: Nurse Practitioner

## 2019-01-20 DIAGNOSIS — K7460 Unspecified cirrhosis of liver: Secondary | ICD-10-CM

## 2019-01-23 NOTE — Telephone Encounter (Signed)
Patient made aware he needs an ov.  He is scheduled to see EG on 10/19 at 1:30 pm

## 2019-01-23 NOTE — Telephone Encounter (Signed)
Patient hasn't been seen in office since 2018. He needs follow up for cirrhosis for further refills.

## 2019-01-28 ENCOUNTER — Other Ambulatory Visit: Payer: Self-pay

## 2019-01-28 ENCOUNTER — Encounter: Payer: Self-pay | Admitting: Nurse Practitioner

## 2019-01-28 ENCOUNTER — Ambulatory Visit: Payer: Medicare HMO | Admitting: Nurse Practitioner

## 2019-01-28 VITALS — BP 130/68 | HR 72 | Temp 98.1°F | Ht 70.0 in | Wt 223.6 lb

## 2019-01-28 DIAGNOSIS — K7460 Unspecified cirrhosis of liver: Secondary | ICD-10-CM

## 2019-01-28 DIAGNOSIS — K219 Gastro-esophageal reflux disease without esophagitis: Secondary | ICD-10-CM

## 2019-01-28 MED ORDER — PANTOPRAZOLE SODIUM 40 MG PO TBEC: 40.0000 mg | DELAYED_RELEASE_TABLET | Freq: Every day | ORAL | 3 refills | Status: DC

## 2019-01-28 NOTE — Patient Instructions (Addendum)
Your health issues we discussed today were:   GERD (reflux/heartburn): 1. I am sending in a refill of Protonix to your pharmacy 2. Let us know if you have any worsening or severe symptoms  Cirrhosis: 1. Call us after the first of the year if you are able to follow-up and have an updated ultrasound and a few labs. 2. It is very important that we follow your liver disease to catch any complications early that they can be appropriately treated 3. Call us if you have any worsening or concerning symptoms  Overall I recommend:  1. Continue your other current medications 2. Return for follow-up in 1 year otherwise 3. Call us if you have any questions or concerns.   Because of recent events of COVID-19 ("Coronavirus"), follow CDC recommendations:  1. Wash your hand frequently 2. Avoid touching your face 3. Stay away from people who are sick 4. If you have symptoms such as fever, cough, shortness of breath then call your healthcare provider for further guidance 5. If you are sick, STAY AT HOME unless otherwise directed by your healthcare provider. 6. Follow directions from state and national officials regarding staying safe   At [facility] we value your feedback. You may receive a survey about your visit today. Please share your experience as we strive to create trusting relationships with our patients to provide genuine, compassionate, quality care.  We appreciate your understanding and patience as we review any laboratory studies, imaging, and other diagnostic tests that are ordered as we care for you. Our office policy is 5 business days for review of these results, and any emergent or urgent results are addressed in a timely manner for your best interest. If you do not hear from our office in 1 week, please contact us.   We also encourage the use of MyChart, which contains your medical information for your review as well. If you are not enrolled in this feature, an access  code is on this after visit summary for your convenience. Thank you for allowing [place] to be involved in your care.  It was great to see you today!  I hope you have a great Fall!!

## 2019-01-28 NOTE — Assessment & Plan Note (Signed)
I discussed the pathophysiology of cirrhosis and the possibility of decompensation and/or liver cancer increased risk.  I discussed the need for updated labs and imaging to better track his disease status.  States he cannot afford this at this time.  However, we will get new insurance after the first of the year and will have availability at [facility] or [place] funds.  He may be able to follow-up at that time.  I highly encouraged him to call us after the first of the year if he is able to follow-up.  I discussed the risks of not following up on his cirrhosis diligently and he verbalized understanding.  Currently he is generally asymptomatic from a hepatic standpoint.  Follow-up in 1 year, or if possible after the first of the year.

## 2019-01-28 NOTE — Progress Notes (Signed)
Primary Care Physician:  Sharilyn Sites, MD Primary Gastroenterologist:  Dr. Gala Romney  Chief Complaint  Patient presents with   Follow-up    Refill medication (Pantoprazole)    HPI:   Ralph [name] is a 68 y.o. male who presents for follow-up for further refills.  The patient was last seen in our office 07/01/2016 for cirrhosis, GERD, history of colon polyps.  Noted history of [place] cirrhosis, GERD with dysphagia.  Some oral pharyngeal dysfunction and is working with speech pathology.  Reflux symptoms well controlled.  Grade 1 esophageal varices without previous bleed currently on nadolol.  On Xarelto for A. fib, meld not obtainable.  Last ultrasound with cirrhotic liver without hepatoma.  Due for surveillance colonoscopy in 2021.  Chronic GERD felt to be well managed, well compensated cirrhosis.  Recommended continue nadolol, continue Aldactone, liver ultrasound every 6 months, Protonix daily.  Also recommended weight loss, regular aerobic exercise, regular coffee intake, colonoscopy in 2021 and follow-up office visit in 6 months.  Patient is not had updated liver imaging or follow-up as recommended.  Most recent CMP dated 12/02/2017 found mild hyponatremia at 130, elevated blood sugar 329, LFTs and kidney function normal.  At some point he requested a refill of his medications and was told he needed a follow-up office visit.  Today he states he's doing ok. He needs a refill of his Pantoprazole. States he hasn't had any follow-up due to inability to afford his visits, labs, or imaging. States he can't afford it even with insurance, monthly payments wont help ("I'm so broke I can't pay attention"). He has a few days of Protonix left. Protonix controls his GERD well. Denies abdominal pain, N/V, hematochezia, melena, fever, chills, unintentional weight loss. Denies URI or flu-like symptoms. Denies loss of sense of taste or smell. Denies chest pain, dyspnea, dizziness, lightheadedness, syncope, near  syncope. Denies any other upper or lower GI symptoms.  He states after the first of the year he may be able to do follow-up labs and imaging because he is changing insurances and will have [place] funds available.  Past Medical History:  Diagnosis Date   Anginal pain ([place])    Arthritis    "back; fingers" (01/06/2012)   Atrial flutter (HCC)    s/p EPS +RF ablation of typical atrial flutter April 2015   Cancer [facility])    skin cancer   CHB (complete heart block) (HCC)    CHF (congestive heart failure) ([place]) 01/06/2012   Chronic lower back pain    Cirrhosis ([place])    NASH-Hep A and B immune   Coughing up blood    "comes from my throat" (01/06/2012)   DDD (degenerative disc disease), lumbar    Depressed    Difficult intubation    Eschmann stylet used in 2002 and 2007; "trouble waking up afterwards" (01/06/2012)   Emphysema    Fatty liver disease, nonalcoholic    GERD (gastroesophageal reflux disease)    H/O hiatal hernia    Headache    History of esophageal varices    Hypertension    Hypothyroidism    Orthostatic dizziness    Pacemaker    Pneumonia Aug 2016   Presence of permanent cardiac pacemaker 9/292013   St.Jude   Sinus pause 01/06/2012   5.2 seconds   Sleep apnea    "don't wear mask" (01/06/2012)   Stroke [facility])    pt states that he might have had a stroke not sure   Type II diabetes mellitus ([place])  Varicose vein    of esophagus    Past Surgical History:  Procedure Laterality Date   ATRIAL FLUTTER ABLATION N/A 07/10/2013   Procedure: ATRIAL FLUTTER ABLATION;  Surgeon: Evans Lance, MD;  Location: [facility] CATH LAB;  Service: Cardiovascular;  Laterality: N/A;   BACK SURGERY     CHOLECYSTECTOMY  1993   COLONOSCOPY  11/08/2004   [id number] rectum, colon, TI.   COLONOSCOPY N/A 05/28/2014   Dr. Gala Romney: Redundant colon. single colonic polyp removed as described above. Tubular adenoma   ESOPHAGEAL DILATION N/A 05/28/2014   Procedure: ESOPHAGEAL  DILATION;  Surgeon: Daneil Dolin, MD;  Location[facility] ENDO [facility];  Service: Endoscopy;  Laterality: N/A;   ESOPHAGOGASTRODUODENOSCOPY  11/08/2004   [id number] esophageal erosions consistent with erosive reflux esophagitis/Areas of hemorrhage and nodularity of the fundal mucosa of uncertain significance, biopsied.  Small hiatal hernia, otherwise normal stomach   ESOPHAGOGASTRODUODENOSCOPY  2010   Dr. Gala Romney: 3 columns Grade 1 varices, erosive esophagitis, HH, portal gastropathy, normal D1, D2   ESOPHAGOGASTRODUODENOSCOPY N/A 05/28/2014   Dr. Gala Romney: MIld erosive reflux esophagitis. Grade 1 esophageal varices. Patent esophagus. No dilation performed. Hiatal hernia.    ESOPHAGOGASTRODUODENOSCOPY (EGD) WITH ESOPHAGEAL DILATION N/A 02/14/2013   [id number] 1 esophageal varices. Abnormal distal esophagus/status post biopsy after Maloney dilation. Portal gastropathy. Antral erosions-status post biopsy. path negative for H.pylori, benign path.   [place]; ~ 1995; ~ [place]   nuclear stress test  10/19/2004   No ischemia   PERMANENT PACEMAKER INSERTION  01/08/2012   CHB   PERMANENT PACEMAKER INSERTION N/A 01/09/2012   Procedure: PERMANENT PACEMAKER INSERTION;  Surgeon: Sanda Klein, MD;  Location: [place] CATH LAB;  Service: Cardiovascular;  Laterality: N/A;   POSTERIOR FUSION LUMBAR SPINE  1999   L4-5   SPINAL CORD STIMULATOR IMPLANT  2006   SPINAL CORD STIMULATOR REMOVAL N/A 01/27/2015   Procedure: LUMBAR SPINAL CORD STIMULATOR REMOVAL;  Surgeon: Kristeen Miss, MD;  Location: [place] NEURO ORS;  Service: Neurosurgery;  Laterality: N/A;  LUMBAR SPINAL CORD STIMULATOR REMOVAL   TONSILLECTOMY AND ADENOIDECTOMY  1992   US ECHOCARDIOGRAPHY  12/28/2011   mild LVH,mild mitral annulara ca+,mild MR   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 90 tablet 0   diltiazem (CARDIZEM CD) 180 MG 24 hr capsule TAKE 1 CAPSULE BY MOUTH EVERY DAY 90 capsule 2   flecainide (TAMBOCOR) 100 MG tablet TAKE 1 TABLET BY MOUTH TWICE A DAY 60 tablet 11   furosemide (LASIX) 20 MG tablet TAKE 2 TABLETS BY MOUTH DAILY AS NEEDED FOR SWELLING 180 tablet 3   glucose blood (ONETOUCH VERIO) test strip 1 each by Other route 2 (two) times daily with a meal. Use as instructed 200 each 6   Insulin Glargine-Lixisenatide (SOLIQUA) 100-33 UNT-MCG/ML SOPN Inject 32 Units into the skin daily with breakfast. 15 mL 6   Insulin Pen Needle (BD PEN NEEDLE MICRO U/F) 32G X 6 MM MISC Daily 50 each 6   levothyroxine (SYNTHROID, LEVOTHROID) 112 MCG tablet TAKE 1 TABLET BY MOUTH EVERY MORNING BEFORE BREAKFAST (Patient taking differently: Take 112 mcg by mouth every morning. ) 30 tablet 0   metFORMIN (GLUCOPHAGE) 1000 MG tablet Take 1 tablet (1,000 mg total) by mouth 2 (two) times daily with a meal. 180 tablet 3  nadolol (CORGARD) 40 MG tablet TAKE 1 TABLET IN THE MORNING AND 1/2 TABLET BY MOUTH EVERY EVENING (Patient taking differently: Takes 1 tablet in the morning and 1 tablet in the evening.) 135 tablet 2   Omega-3 Fatty Acids (FISH OIL) 1200 MG CAPS Take by mouth 2 (two) times daily.     pantoprazole (PROTONIX) 40 MG tablet TAKE 1 TABLET (40 MG TOTAL) BY MOUTH DAILY. 90 tablet 3   propranolol (INDERAL) 20 MG tablet TAKE 1 TABLET BY MOUTH TWICE A DAY 60 tablet 3   rivaroxaban (XARELTO) 20 MG TABS tablet TAKE 1 TABLET BY MOUTH EVERY DAY WITH DINNER 90 tablet 0   simvastatin (ZOCOR) 20 MG tablet TAKE 1 TABLET BY MOUTH EVERY DAY IN THE EVENING 90 tablet 3   spironolactone (ALDACTONE) 50 MG tablet Take 1 tablet (50 mg total) by mouth 2 (two) times daily. 60 tablet 1   No current facility-administered medications for this visit.     Allergies as of 01/28/2019 - Review Complete 01/28/2019  Allergen Reaction Noted   Nitroglycerin Hives,  Swelling, and Rash     Family History  Problem Relation Age of Onset   Cancer Mother        Deceased, 25   Ovarian cancer Mother    Arrhythmia Father    Other Father        Deceased 44   Stroke Brother    Stroke Brother    Stroke Sister    Crohn's disease Daughter    Diabetes Maternal Grandmother    Colon cancer Neg Hx     Social History   Socioeconomic History   Marital status: Married    Spouse name: Not on file   Number of children: Not on file   Years of education: Not on file   Highest education level: Not on file  Occupational History   Not on file  Social Needs   Financial resource strain: Not on file   Food insecurity    Worry: Not on file    Inability: Not on file   Transportation needs    Medical: Not on file    Non-medical: Not on file  Tobacco Use   Smoking status: Former Smoker    Packs/day: 0.25    Years: 45.00    Pack years: 11.25    Types: Cigarettes    Start date: 04/11/1968    Quit date: 04/11/2014    Years since quitting: 4.8   Smokeless tobacco: Never Used   Tobacco comment: Quit x 8 months this time  Substance and Sexual Activity   Alcohol use: No    Alcohol/week: 0.0 standard drinks    Comment: "quit alcohol 2011" Previously drinking socially about twice per month   Drug use: No   Sexual activity: Never  Lifestyle   Physical activity    Days per week: Not on file    Minutes per session: Not on file   Stress: Not on file  Relationships   Social connections    Talks on phone: Not on file    Gets together: Not on file    Attends religious service: Not on file    Active member of club or organization: Not on file    Attends meetings of clubs or organizations: Not on file    Relationship status: Not on file   Intimate partner violence    Fear of current or ex partner: Not on file    Emotionally abused: Not on file    Physically  abused: Not on file    Forced sexual activity: Not on file  Other Topics  Concern   Not on file  Social History Narrative   Lives with wife in a one story home.  Has 3 children.     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     Education: some college.    Review of Systems: General: Negative for anorexia, weight loss, fever, chills, fatigue, weakness. ENT: Negative for hoarseness, difficulty swallowing. CV: Negative for chest pain, angina, palpitations, peripheral edema.  Respiratory: Negative for dyspnea at rest, cough, sputum, wheezing.  GI: See history of present illness. Derm: Negative for rash or itching.  Neuro: Negative for memory loss, confusion.  Endo: Negative for unusual weight change.  Heme: Negative for bruising or bleeding. Allergy: Negative for rash or hives.    Physical Exam: BP 130/68    Pulse 72    Temp 98.1 F (36.7 C)    Ht 5\' 10"  (1.778 m)    Wt 223 lb 9.6 oz (101.4 kg)    BMI 32.08 kg/m  General:   Alert and oriented. Pleasant and cooperative. Well-nourished and well-developed.  Eyes:  Without icterus, sclera clear and conjunctiva pink.  Ears:  Normal auditory acuity. Cardiovascular:  S1, S2 present without murmurs appreciated. Extremities without clubbing or edema. Respiratory:  Clear to auscultation bilaterally. No wheezes, rales, or rhonchi. No distress.  Gastrointestinal:  +BS, soft, non-tender and non-distended. No HSM noted. No guarding or rebound. No masses appreciated.  Rectal:  Deferred  Musculoskalatal:  Symmetrical without gross deformities. Neurologic:  Alert and oriented x4;  grossly normal neurologically. Psych:  Alert and cooperative. Normal mood and affect. Heme/Lymph/Immune: No excessive bruising noted.    01/28/2019 2:12 PM   Disclaimer: This note was dictated with voice recognition software. Similar sounding words can inadvertently be transcribed and may not be corrected upon review.

## 2019-01-28 NOTE — Assessment & Plan Note (Signed)
History of GERD generally well managed on Protonix 40 mg daily.  He is currently about to run out of his medication and is needing a refill.  I will send a refill to his pharmacy.  Return for follow-up in 1 year.  Call us after the first of the year if he is able to have updated labs and imaging related to cirrhosis.

## 2019-01-29 NOTE — Progress Notes (Signed)
cc'ed to pcp °

## 2019-02-08 DIAGNOSIS — N182 Chronic kidney disease, stage 2 (mild): Secondary | ICD-10-CM | POA: Diagnosis not present

## 2019-02-08 DIAGNOSIS — E669 Obesity, unspecified: Secondary | ICD-10-CM | POA: Diagnosis not present

## 2019-02-08 DIAGNOSIS — E119 Type 2 diabetes mellitus without complications: Secondary | ICD-10-CM | POA: Diagnosis not present

## 2019-02-18 DIAGNOSIS — G894 Chronic pain syndrome: Secondary | ICD-10-CM | POA: Diagnosis not present

## 2019-03-01 ENCOUNTER — Other Ambulatory Visit: Payer: Self-pay | Admitting: Gastroenterology

## 2019-03-12 ENCOUNTER — Ambulatory Visit (INDEPENDENT_AMBULATORY_CARE_PROVIDER_SITE_OTHER): Payer: Medicare HMO | Admitting: *Deleted

## 2019-03-12 DIAGNOSIS — Z950 Presence of cardiac pacemaker: Secondary | ICD-10-CM | POA: Diagnosis not present

## 2019-03-13 LAB — CUP PACEART REMOTE DEVICE CHECK
Battery Remaining Longevity: 48 mo
Battery Remaining Percentage: 46 %
Battery Voltage: 2.86 V
Brady Statistic AP VP Percent: 1 %
Brady Statistic AP VS Percent: 69 %
Brady Statistic AS VP Percent: 1 %
Brady Statistic AS VS Percent: 31 %
Brady Statistic RA Percent Paced: 68 %
Brady Statistic RV Percent Paced: 1 %
Date Time Interrogation Session: 20201201232911
Implantable Lead Implant Date: 20130920
Implantable Lead Implant Date: 20130920
Implantable Lead Location: 753859
Implantable Lead Location: 753860
Implantable Pulse Generator Implant Date: 20130920
Lead Channel Impedance Value: 330 Ohm
Lead Channel Impedance Value: 440 Ohm
Lead Channel Pacing Threshold Amplitude: 0.5 V
Lead Channel Pacing Threshold Amplitude: 0.75 V
Lead Channel Pacing Threshold Pulse Width: 0.4 ms
Lead Channel Pacing Threshold Pulse Width: 1 ms
Lead Channel Sensing Intrinsic Amplitude: 3.2 mV
Lead Channel Sensing Intrinsic Amplitude: 4.5 mV
Lead Channel Setting Pacing Amplitude: 2 V
Lead Channel Setting Pacing Amplitude: 2.5 V
Lead Channel Setting Pacing Pulse Width: 1 ms
Lead Channel Setting Sensing Sensitivity: 1 mV
Pulse Gen Model: 2210
Pulse Gen Serial Number: 7393982

## 2019-03-29 ENCOUNTER — Other Ambulatory Visit: Payer: Self-pay | Admitting: Cardiology

## 2019-03-30 DIAGNOSIS — R69 Illness, unspecified: Secondary | ICD-10-CM | POA: Diagnosis not present

## 2019-04-10 DIAGNOSIS — I10 Essential (primary) hypertension: Secondary | ICD-10-CM | POA: Diagnosis not present

## 2019-04-10 DIAGNOSIS — E1129 Type 2 diabetes mellitus with other diabetic kidney complication: Secondary | ICD-10-CM | POA: Diagnosis not present

## 2019-04-10 DIAGNOSIS — I4891 Unspecified atrial fibrillation: Secondary | ICD-10-CM | POA: Diagnosis not present

## 2019-04-15 ENCOUNTER — Other Ambulatory Visit: Payer: Self-pay | Admitting: Cardiology

## 2019-04-29 ENCOUNTER — Ambulatory Visit: Payer: Medicare HMO | Admitting: Internal Medicine

## 2019-04-29 ENCOUNTER — Encounter: Payer: Self-pay | Admitting: Internal Medicine

## 2019-04-29 ENCOUNTER — Other Ambulatory Visit: Payer: Self-pay

## 2019-04-29 VITALS — BP 124/72 | HR 74 | Temp 97.9°F | Ht 70.0 in | Wt 231.6 lb

## 2019-04-29 DIAGNOSIS — C44629 Squamous cell carcinoma of skin of left upper limb, including shoulder: Secondary | ICD-10-CM | POA: Diagnosis not present

## 2019-04-29 DIAGNOSIS — L720 Epidermal cyst: Secondary | ICD-10-CM | POA: Diagnosis not present

## 2019-04-29 DIAGNOSIS — K7460 Unspecified cirrhosis of liver: Secondary | ICD-10-CM | POA: Diagnosis not present

## 2019-04-29 DIAGNOSIS — I509 Heart failure, unspecified: Secondary | ICD-10-CM | POA: Diagnosis not present

## 2019-04-29 DIAGNOSIS — E1165 Type 2 diabetes mellitus with hyperglycemia: Secondary | ICD-10-CM | POA: Diagnosis not present

## 2019-04-29 DIAGNOSIS — E119 Type 2 diabetes mellitus without complications: Secondary | ICD-10-CM | POA: Insufficient documentation

## 2019-04-29 DIAGNOSIS — Z794 Long term (current) use of insulin: Secondary | ICD-10-CM | POA: Diagnosis not present

## 2019-04-29 DIAGNOSIS — E1140 Type 2 diabetes mellitus with diabetic neuropathy, unspecified: Secondary | ICD-10-CM | POA: Diagnosis not present

## 2019-04-29 DIAGNOSIS — E1142 Type 2 diabetes mellitus with diabetic polyneuropathy: Secondary | ICD-10-CM

## 2019-04-29 LAB — POCT GLYCOSYLATED HEMOGLOBIN (HGB A1C): Hemoglobin A1C: 7.5 % — AB (ref 4.0–5.6)

## 2019-04-29 MED ORDER — SOLIQUA 100-33 UNT-MCG/ML ~~LOC~~ SOPN: 38.0000 [IU] | PEN_INJECTOR | Freq: Every day | SUBCUTANEOUS | 6 refills | Status: DC

## 2019-04-29 NOTE — Progress Notes (Signed)
Name: Ralph [name]  Age/ Sex: 69 y.o., male   MRN/ DOB: [id number], 19-Nov-1950     PCP: Sharilyn Sites, MD   Reason for Endocrinology Evaluation: Type 2 Diabetes Mellitus  Initial Endocrine Consultative Visit: 04/18/2018    PATIENT IDENTIFIER: [name] is a 69 y.o. male with a past medical history of A.Fib (S/P Pacemaker), CHF, HTN, Hypothyroidism and T2DM. The patient has followed with Endocrinology clinic since 04/18/2017 for consultative assistance with management of his diabetes.  DIABETIC HISTORY:  [name] was diagnosed with T2DM in ~ 2009, he has been on oral glycemic agents for years, initially was only on metformin followed by farxiga and levemir added ~ 6 yrs ago but was unable to afford it. His hemoglobin A1c has ranged from 5.7% in 12/2015, peaking at 7.5% in 06/2015.   SUBJECTIVE:   During the last visit (12/31/18): We increased soliqua , continued metformin and [place]     Today (04/29/2019): [name] is here for 3 month  follow-up appointment on diabetes management He checks his blood sugars 2 times daily, fasting and bedtime. The patient has not had hypoglycemic episodes since the last clinic visit. Otherwise, the patient has not required any recent emergency interventions for hypoglycemia and has not had recent hospitalizations secondary to hyper or hypoglycemic episodes.    ROS: As per HPI and as detailed below: Review of Systems  Constitutional: Negative for fever and weight loss.  Respiratory: Negative for cough and shortness of breath.   Cardiovascular: Negative for chest pain and palpitations.  Gastrointestinal: Positive for nausea. Negative for diarrhea and vomiting.    Nausea this am, which is chronic Denies constipation and diarrhea.   Left forearm keratoacanthoma- pending dermatology today   HOME DIABETES REGIMEN:  - Metformin 1000 mg Twice a day with meals  -  Farxiga 10 mg once a day  - Soliqua 32 units daily     METER DOWNLOAD SUMMARY: Date range evaluated: 1/5-1/18/2021 Fingerstick Blood Glucose Tests = 25 Overall Mean FS Glucose = 186 Glucose Checks per Day = 1.8  BG Ranges: Low = 142 High = 250   Hypoglycemic Events/30 Days: BG < 50 =0 Episodes of symptomatic severe hypoglycemia = 0  DIABETIC COMPLICATIONS: Microvascular complications:  Neuropathy Denies: retinopathy  Last eye exam: Completed 04/2018  Macrovascular complications:  CHF  Denies: CAD, PVD, CVA  HISTORY:  Past Medical History:  Past Medical History:  Diagnosis Date  . Anginal pain ([place])   . Arthritis    "back; fingers" (01/06/2012)  . Atrial flutter [facility])    s/p EPS +RF ablation of typical atrial flutter April 2015  . Cancer ([place])    skin cancer  . CHB (complete heart block) ([place])   . CHF (congestive heart failure) ([place]) 01/06/2012  . Chronic lower back pain   . Cirrhosis ([place])    NASH-Hep A and B immune  . Coughing up blood    "comes from my throat" (01/06/2012)  . DDD (degenerative disc disease), lumbar   . Depressed   . Difficult intubation    Eschmann stylet used in 2002 and 2007; "trouble waking up afterwards" (01/06/2012)  . Emphysema   . Fatty liver disease, nonalcoholic   . GERD (gastroesophageal reflux disease)   . H/O hiatal hernia   . Headache   . History of esophageal varices   . Hypertension   . Hypothyroidism   . Orthostatic dizziness   . Pacemaker   . Pneumonia Aug 2016  .  Presence of permanent cardiac pacemaker 9/292013   St.Jude  . Sinus pause 01/06/2012   5.2 seconds  . Sleep apnea    "don't wear mask" (01/06/2012)  . Stroke [facility])    pt states that he might have had a stroke not sure  . Type II diabetes mellitus ([place])   . Varicose vein    of esophagus   Past Surgical History:  Past Surgical History:  Procedure Laterality Date  . ATRIAL FLUTTER ABLATION N/A 07/10/2013   Procedure: ATRIAL FLUTTER ABLATION;  Surgeon:  Evans Lance, MD;  Location: [facility] CATH LAB;  Service: Cardiovascular;  Laterality: N/A;  . BACK SURGERY    . CHOLECYSTECTOMY  1993  . COLONOSCOPY  11/08/2004   [id number] rectum, colon, TI.  Marland Kitchen COLONOSCOPY N/A 05/28/2014   Dr. Gala Romney: Redundant colon. single colonic polyp removed as described above. Tubular adenoma  . ESOPHAGEAL DILATION N/A 05/28/2014   Procedure: ESOPHAGEAL DILATION;  Surgeon: Daneil Dolin, MD;  [facility];  Service: Endoscopy;  Laterality: N/A;  . ESOPHAGOGASTRODUODENOSCOPY  11/08/2004   [id number] esophageal erosions consistent with erosive reflux esophagitis/Areas of hemorrhage and nodularity of the fundal mucosa of uncertain significance, biopsied.  Small hiatal hernia, otherwise normal stomach  . ESOPHAGOGASTRODUODENOSCOPY  2010   Dr. Gala Romney: 3 columns Grade 1 varices, erosive esophagitis, HH, portal gastropathy, normal D1, D2  . ESOPHAGOGASTRODUODENOSCOPY N/A 05/28/2014   Dr. Gala Romney: MIld erosive reflux esophagitis. Grade 1 esophageal varices. Patent esophagus. No dilation performed. Hiatal hernia.   Marland Kitchen ESOPHAGOGASTRODUODENOSCOPY (EGD) WITH ESOPHAGEAL DILATION N/A 02/14/2013   [id number] 1 esophageal varices. Abnormal distal esophagus/status post biopsy after Maloney dilation. Portal gastropathy. Antral erosions-status post biopsy. path negative for H.pylori, benign path.  . LUMBAR [place]; ~ 1995; ~ 1996  . NASAL SEPTUM SURGERY  1992  . nuclear stress test  10/19/2004   No ischemia  . PERMANENT PACEMAKER INSERTION  01/08/2012   CHB  . PERMANENT PACEMAKER INSERTION N/A 01/09/2012   Procedure: PERMANENT PACEMAKER INSERTION;  Surgeon: Sanda Klein, MD;  Location: [place] CATH LAB;  Service: Cardiovascular;  Laterality: N/A;  . POSTERIOR FUSION LUMBAR SPINE  1999   L4-5  . SPINAL CORD STIMULATOR IMPLANT  2006  . SPINAL CORD STIMULATOR REMOVAL N/A 01/27/2015   Procedure: LUMBAR SPINAL CORD STIMULATOR REMOVAL;  Surgeon: Kristeen Miss, MD;  Location: [place] NEURO  ORS;  Service: Neurosurgery;  Laterality: N/A;  LUMBAR SPINAL CORD STIMULATOR REMOVAL  . TONSILLECTOMY AND ADENOIDECTOMY  1992  . US ECHOCARDIOGRAPHY  12/28/2011   mild LVH,mild mitral annulara ca+,mild MR  . 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 His smoking use included cigarettes. He started smoking about 51 years ago. He has a 11.25 pack-year smoking history. He has never used smokeless tobacco. He reports that he does not drink alcohol or use drugs. Family History:  Family History  Problem Relation Age of Onset  . Cancer Mother        Deceased, 21  . Ovarian cancer Mother   . Arrhythmia Father   . Other Father        Deceased 74  . Stroke Brother   . Stroke Brother   . Stroke Sister   . Crohn's disease Daughter   . Diabetes Maternal Grandmother   . Colon cancer Neg Hx      HOME MEDICATIONS: Allergies as of 04/29/2019      Reactions  Nitroglycerin Hives, Swelling, Rash      Medication List       Accurate as of April 29, 2019  9:43 AM. If you have any questions, ask your nurse or doctor.        Accu-Chek Aviva device by Other route. Use as instructed to check blood sugar 2 times daily   dapagliflozin propanediol 10 MG Tabs tablet Commonly known as: Farxiga Take 10 mg by mouth daily.   diltiazem 180 MG 24 hr capsule Commonly known as: CARDIZEM CD TAKE 1 CAPSULE BY MOUTH EVERY DAY   Fish Oil 1200 MG Caps Take by mouth 2 (two) times daily.   flecainide 100 MG tablet Commonly known as: TAMBOCOR TAKE 1 TABLET BY MOUTH TWICE A DAY   furosemide 20 MG tablet Commonly known as: LASIX TAKE 2 TABLETS BY MOUTH DAILY AS NEEDED FOR SWELLING   Insulin Pen Needle 32G X 6 MM Misc Commonly known as: BD Pen Needle Micro U/F Daily   levothyroxine 112 MCG tablet Commonly known as: SYNTHROID TAKE 1 TABLET BY MOUTH EVERY MORNING BEFORE BREAKFAST What changed: See the new instructions.   metFORMIN  1000 MG tablet Commonly known as: GLUCOPHAGE Take 1 tablet (1,000 mg total) by mouth 2 (two) times daily with a meal.   nadolol 40 MG tablet Commonly known as: CORGARD TAKE 1 TABLET IN THE MORNING AND 1/2 TABLET BY MOUTH EVERY EVENING What changed: See the new instructions.   OneTouch Verio test strip Generic drug: glucose blood 1 each by Other route 2 (two) times daily with a meal. Use as instructed   pantoprazole 40 MG tablet Commonly known as: PROTONIX Take 1 tablet (40 mg total) by mouth daily.   propranolol 20 MG tablet Commonly known as: INDERAL TAKE 1 TABLET BY MOUTH TWICE A DAY   rivaroxaban 20 MG Tabs tablet Commonly known as: Xarelto TAKE 1 TABLET BY MOUTH EVERY DAY WITH DINNER   simvastatin 20 MG tablet Commonly known as: ZOCOR TAKE 1 TABLET BY MOUTH EVERY DAY IN THE EVENING   Soliqua 100-33 UNT-MCG/ML Sopn Generic drug: Insulin Glargine-Lixisenatide Inject 32 Units into the skin daily with breakfast.   spironolactone 50 MG tablet Commonly known as: ALDACTONE TAKE 1 TABLET BY MOUTH TWICE A DAY        OBJECTIVE:   Vital Signs: BP 124/72 (BP Location: Right Arm, Patient Position: Sitting, Cuff Size: Large)   Pulse 74   Temp 97.9 F (36.6 C)   Ht 5\' 10"  (1.778 m)   Wt 231 lb 9.6 oz (105.1 kg)   SpO2 95%   BMI 33.23 kg/m   Wt Readings from Last 3 Encounters:  04/29/19 231 lb 9.6 oz (105.1 kg)  01/28/19 223 lb 9.6 oz (101.4 kg)  12/31/18 223 lb 9.6 oz (101.4 kg)   Exam: General: Pt appears well and is in NAD  Lungs: Clear with good BS bilat with no rales, rhonchi, or wheezes  Heart: RRR with normal S1 and S2 and no gallops; no murmurs; no rub  Abdomen: Normoactive bowel sounds, soft, nontender, without masses or organomegaly palpable  Extremities: No pretibial edema.   Neuro: MS is good with appropriate affect, pt is alert and Ox3   DM foot exam: 04/29/2019   The skin of the feet is intact without sores or ulcerations. Multiple plantar callous  formation noted The pedal pulses are 2+ on right and 2+ on left. The sensation is decreased to a screening 5.07, 10 gram monofilament bilaterally  DATA REVIEWED:      Results for ULUS, POTTORF (MRN [id number]) as of 06/29/2018 09:40  Ref. Range 06/28/2018 09:43  Sodium Latest Ref Range: 135 - 145 mEq/L 137  Potassium Latest Ref Range: 3.5 - 5.1 mEq/L 4.5  Chloride Latest Ref Range: 96 - 112 mEq/L 99  CO2 Latest Ref Range: 19 - 32 mEq/L 30  Glucose Latest Ref Range: 70 - 99 mg/dL 153 (H)  BUN Latest Ref Range: 6 - 23 mg/dL 21  Creatinine Latest Ref Range: 0.40 - 1.50 mg/dL 1.00  Calcium Latest Ref Range: 8.4 - 10.5 mg/dL 9.5     ASSESSMENT / PLAN / RECOMMENDATIONS:   1)Type 2 Diabetes Mellitus, Sub-Optimally  controlled With Neuropathic  complications - Most recent A1c of 7.5 %. Goal A1c <7.0 %.    - A1c stable , but continues to be above goal  -In review of his glucose meter download, he was noted to have a higher a.m. BG's compared to his nightly BG's, we will increase his Willeen Niece as below -He was on patient assistance program through his PCP for the [facility], patient stated that he is not on that patient assistance program at this point, I have advised him that in the future if patient assistance programs are needed we will be happy to fill out his forms  MEDICATIONS: Continue metformin 1000 mg twice a day with meals Continue Farxiga 10 mg once a day Increase Soliqua to 38 units daily with breakfast    EDUCATION / INSTRUCTIONS: BG monitoring instructions: Patient is instructed to check his blood sugars 2 times a day, fasting and bedtime. Call [place] [facility] clinic if: BG persistently < 70 or > 300. I reviewed the Rule of 15 for the treatment of hypoglycemia in detail with the patient. Literature supplied.    F/U in 3 months    Signed electronically by: Mack Guise, MD  [facility] Endocrinology  [facility] [address] [place], [place] [address] Phone: [phone] FAX: [phone]   CC: Sharilyn Sites, [place] [id number] Phone: [phone]  Fax: [phone]  Return to Endocrinology clinic as below: Future Appointments  Date Time Provider [place]  06/11/2019  7:50 AM [facility] DEVICE REMOTES [facility]  09/10/2019  7:50 AM [facility] DEVICE REMOTES [facility]

## 2019-04-29 NOTE — Patient Instructions (Addendum)
-   Increase Soliqua to 38 units daily with breakfast  - Continue Metformin 1000 mg twice a day  - Continue Farxiga 10 mg daily       -HOW TO TREAT LOW BLOOD SUGARS (Blood sugar LESS THAN 70 MG/DL)  Please follow the RULE OF 15 for the treatment of hypoglycemia treatment (when your (blood sugars are less than 70 mg/dL)    STEP 1: Take 15 grams of carbohydrates when your blood sugar is low, which includes:   3-4 GLUCOSE TABS  OR  3-4 OZ OF JUICE OR REGULAR SODA OR  ONE TUBE OF GLUCOSE GEL     STEP 2: RECHECK blood sugar in 15 MINUTES STEP 3: If your blood sugar is still low at the 15 minute recheck --> then, go back to STEP 1 and treat AGAIN with another 15 grams of carbohydrates.

## 2019-05-13 ENCOUNTER — Telehealth: Payer: Self-pay | Admitting: Cardiology

## 2019-05-13 NOTE — Telephone Encounter (Signed)

## 2019-05-16 ENCOUNTER — Encounter: Payer: Self-pay | Admitting: Cardiology

## 2019-05-16 ENCOUNTER — Telehealth (INDEPENDENT_AMBULATORY_CARE_PROVIDER_SITE_OTHER): Payer: Medicare HMO | Admitting: Cardiology

## 2019-05-16 VITALS — BP 124/72 | HR 62 | Ht 70.0 in | Wt 225.0 lb

## 2019-05-16 DIAGNOSIS — I455 Other specified heart block: Secondary | ICD-10-CM

## 2019-05-16 DIAGNOSIS — I4891 Unspecified atrial fibrillation: Secondary | ICD-10-CM | POA: Diagnosis not present

## 2019-05-16 DIAGNOSIS — E782 Mixed hyperlipidemia: Secondary | ICD-10-CM

## 2019-05-16 DIAGNOSIS — I10 Essential (primary) hypertension: Secondary | ICD-10-CM

## 2019-05-16 MED ORDER — DILTIAZEM HCL ER COATED BEADS 240 MG PO CP24: 240.0000 mg | ORAL_CAPSULE | Freq: Every day | ORAL | 3 refills | Status: DC

## 2019-05-16 NOTE — Progress Notes (Signed)
Virtual Visit via Telephone Note   This visit type was conducted due to national recommendations for restrictions regarding the COVID-19 Pandemic (e.g. social distancing) in an effort to limit this patient's exposure and mitigate transmission in our community.  Due to his co-morbid illnesses, this patient is at least at moderate risk for complications without adequate follow up.  This format is felt to be most appropriate for this patient at this time.  The patient did not have access to video technology/had technical difficulties with video requiring transitioning to audio format only (telephone).  All issues noted in this document were discussed and addressed.  No physical exam could be performed with this format.  Please refer to the patient's chart for his  consent to telehealth for [facility].   Date:  05/16/2019   ID:  Ralph [name], DOB 1951-04-04, MRN [id number]  Patient Location: Home Provider Location: Home  PCP:  Sharilyn Sites, MD  Cardiologist:  Dr Carlyle Dolly MD Electrophysiologist:  Cristopher Peru, MD   Evaluation Performed:  Follow-Up Visit  Chief Complaint:  Follow up  History of Present Illness:    Ralph [name] is a 69 y.o. male seen today for follow up of the following medical problems.  1. Bradycardia/sinus arrest  - St Jude dual chamber pacemaker pacemaker implanted Sept 2013 ([place] [facility] [employer]).   03/2019 device normal function  2. Afib/aflutter - previous side effects on multaq - seen by EP 06/28/13, started on flecanide. Continued to have symptoms. Had RF ablation of flutter by Dr Lovena Le 07/10/13. 10/2014 device check did show some AF burden - on nadolol for history of palpitations as well as esoph varices   -occasional palpitations every few weeks, lasts several minutes - no bleeding xarelto  3. HTN  - 120/70s and compliant with meds  4. HL  - 12/2015 TC 111 TG 276 HDL 30 LDL 26 - labs followed by pcp - he is compliant with  statin  5. NASH cirrhosis  - followed by GI  - history of grade I varices, no history of bleeding on anticoag    6. COPD  - compliant with inhalers and home oxygen. - followed by pulmonary  7. Orthostatic dizziness - no recent symptoms  8. DM2 - followed Dr Dorris Fetch     The patient does not have symptoms concerning for COVID-19 infection (fever, chills, cough, or new shortness of breath).    Past Medical History:  Diagnosis Date  . Anginal pain ([place])   . Arthritis    "back; fingers" (01/06/2012)  . Atrial flutter [facility])    s/p EPS +RF ablation of typical atrial flutter April 2015  . Cancer ([place])    skin cancer  . CHB (complete heart block) ([place])   . CHF (congestive heart failure) ([place]) 01/06/2012  . Chronic lower back pain   . Cirrhosis ([place])    NASH-Hep A and B immune  . Coughing up blood    "comes from my throat" (01/06/2012)  . DDD (degenerative disc disease), lumbar   . Depressed   . Difficult intubation    Eschmann stylet used in 2002 and 2007; "trouble waking up afterwards" (01/06/2012)  . Emphysema   . Fatty liver disease, nonalcoholic   . GERD (gastroesophageal reflux disease)   . H/O hiatal hernia   . Headache   . History of esophageal varices   . Hypertension   . Hypothyroidism   . Orthostatic dizziness   . Pacemaker   .  Pneumonia Aug 2016  . Presence of permanent cardiac pacemaker 9/292013   St.Jude  . Sinus pause 01/06/2012   5.2 seconds  . Sleep apnea    "don't wear mask" (01/06/2012)  . Stroke [facility])    pt states that he might have had a stroke not sure  . Type II diabetes mellitus ([place])   . Varicose vein    of esophagus   Past Surgical History:  Procedure Laterality Date  . ATRIAL FLUTTER ABLATION N/A 07/10/2013   Procedure: ATRIAL FLUTTER ABLATION;  Surgeon: Evans Lance, MD;  Location: [facility] CATH LAB;  Service: Cardiovascular;  Laterality: N/A;  . BACK SURGERY    . CHOLECYSTECTOMY  1993  . COLONOSCOPY  11/08/2004   [id number]  rectum, colon, TI.  Marland Kitchen COLONOSCOPY N/A 05/28/2014   Dr. Gala Romney: Redundant colon. single colonic polyp removed as described above. Tubular adenoma  . ESOPHAGEAL DILATION N/A 05/28/2014   Procedure: ESOPHAGEAL DILATION;  Surgeon: Daneil Dolin, MD;  [facility];  Service: Endoscopy;  Laterality: N/A;  . ESOPHAGOGASTRODUODENOSCOPY  11/08/2004   [id number] esophageal erosions consistent with erosive reflux esophagitis/Areas of hemorrhage and nodularity of the fundal mucosa of uncertain significance, biopsied.  Small hiatal hernia, otherwise normal stomach  . ESOPHAGOGASTRODUODENOSCOPY  2010   Dr. Gala Romney: 3 columns Grade 1 varices, erosive esophagitis, HH, portal gastropathy, normal D1, D2  . ESOPHAGOGASTRODUODENOSCOPY N/A 05/28/2014   Dr. Gala Romney: MIld erosive reflux esophagitis. Grade 1 esophageal varices. Patent esophagus. No dilation performed. Hiatal hernia.   Marland Kitchen ESOPHAGOGASTRODUODENOSCOPY (EGD) WITH ESOPHAGEAL DILATION N/A 02/14/2013   [id number] 1 esophageal varices. Abnormal distal esophagus/status post biopsy after Maloney dilation. Portal gastropathy. Antral erosions-status post biopsy. path negative for H.pylori, benign path.  . LUMBAR [place]; ~ 1995; ~ 1996  . NASAL SEPTUM SURGERY  1992  . nuclear stress test  10/19/2004   No ischemia  . PERMANENT PACEMAKER INSERTION  01/08/2012   CHB  . PERMANENT PACEMAKER INSERTION N/A 01/09/2012   Procedure: PERMANENT PACEMAKER INSERTION;  Surgeon: Sanda Klein, MD;  Location: [place] CATH LAB;  Service: Cardiovascular;  Laterality: N/A;  . POSTERIOR FUSION LUMBAR SPINE  1999   L4-5  . SPINAL CORD STIMULATOR IMPLANT  2006  . SPINAL CORD STIMULATOR REMOVAL N/A 01/27/2015   Procedure: LUMBAR SPINAL CORD STIMULATOR REMOVAL;  Surgeon: Kristeen Miss, MD;  Location: [place] NEURO ORS;  Service: Neurosurgery;  Laterality: N/A;  LUMBAR SPINAL CORD STIMULATOR REMOVAL  . TONSILLECTOMY AND ADENOIDECTOMY  1992  . US ECHOCARDIOGRAPHY  12/28/2011   mild  LVH,mild mitral annulara ca+,mild MR  . Warthin's tumor excision  1990's   right     No outpatient medications have been marked as taking for the 05/16/19 encounter (Appointment) with Arnoldo Lenis, MD.     Allergies:   Nitroglycerin   Social History   Tobacco Use  . Smoking status: Former Smoker    Packs/day: 0.25    Years: 45.00    Pack years: 11.25    Types: Cigarettes    Start date: 04/11/1968    Quit date: 04/11/2014    Years since quitting: 5.0  . Smokeless tobacco: Never Used  . Tobacco comment: Quit x 8 months this time  Substance Use Topics  . Alcohol use: No    Alcohol/week: 0.0 standard drinks    Comment: "quit alcohol 2011" Previously drinking socially about twice per month  . Drug use: No     Family Hx: The patient's family history includes Arrhythmia in  his father; Cancer in his mother; Crohn's disease in his daughter; Diabetes in his maternal grandmother; Other in his father; Ovarian cancer in his mother; Stroke in his brother, brother, and sister. There is no history of Colon cancer.  ROS:   Please see the history of present illness.     All other systems reviewed and are negative.   Prior CV studies:   The following studies were reviewed today:  Jan 2014 Myoview: no ischemia   11/2012 Echo: LVEF 60-65%, mild LVH, moderate basal septal hypertrophy, mild LAE  Labs/Other Tests and Data Reviewed:    EKG:  No ECG reviewed.  Recent Labs: 06/28/2018: BUN 21; Creatinine, Ser 1.00; Potassium 4.5; Sodium 137   Recent Lipid Panel Lab Results  Component Value Date/Time   CHOL 111 (L) 12/11/2015 07:53 AM   TRIG 276 (H) 12/11/2015 07:53 AM   HDL 30 (L) 12/11/2015 07:53 AM   CHOLHDL 3.7 12/11/2015 07:53 AM   LDLCALC 26 12/11/2015 07:53 AM    Wt Readings from Last 3 Encounters:  04/29/19 231 lb 9.6 oz (105.1 kg)  01/28/19 223 lb 9.6 oz (101.4 kg)  12/31/18 223 lb 9.6 oz (101.4 kg)     Objective:    Vital Signs:   Today's Vitals   05/16/19 0902    BP: 124/72  Pulse: 62  Weight: 225 lb (102.1 kg)  Height: 5\' 10"  (1.778 m)   Body mass index is 32.28 kg/m. Normal affect. Normal speech pattern and tone. Comfortable, no apparent distress. No audible signs of SOB or wheezing.   ASSESSMENT & PLAN:    1. Afib/Aflutter - recent palpitations, increase dilt to 240mg  daily - continue xareto   2. HTN:  - at goal, continue current meds  3. HL  -  Reports PCP is watching his LFTs closely due to an increase and history of NASH, will defer management to PCP  - request pcp labs, continue staitn  4. Sinus arrest/Pacemaker - normal device check 03/2019, no symptoms, conitnue to monitor    COVID-19 Education: The signs and symptoms of COVID-19 were discussed with the patient and how to seek care for testing (follow up with PCP or arrange E-visit).  The importance of social distancing was discussed today.  Time:   Today, I have spent 21 minutes with the patient with telehealth technology discussing the above problems.     Medication Adjustments/Labs and Tests Ordered: Current medicines are reviewed at length with the patient today.  Concerns regarding medicines are outlined above.   Tests Ordered: No orders of the defined types were placed in this encounter.   Medication Changes: No orders of the defined types were placed in this encounter.   Follow Up:  Either In Person or Virtual in 6 month(s)  Signed, Carlyle Dolly, MD  05/16/2019 8:16 AM    [place]

## 2019-05-16 NOTE — Patient Instructions (Signed)
Medication Instructions:  INCREASE DILTIAZEM TO 240 MG DAILY   Labwork: I WILL REQUEST LABS FROM PCP   Testing/Procedures: NONE  Follow-Up: Your physician wants you to follow-up in: 6 MONTHS.  You will receive a reminder letter in the mail two months in advance. If you don't receive a letter, please call our office to schedule the follow-up appointment.   Any Other Special Instructions Will Be Listed Below (If Applicable).     If you need a refill on your cardiac medications before your next appointment, please call your pharmacy.

## 2019-05-16 NOTE — Addendum Note (Signed)
Addended byDebbora Lacrosse R on: 05/16/2019 10:40 AM   Modules accepted: Orders

## 2019-05-20 DIAGNOSIS — T788XXA Other adverse effects, not elsewhere classified, initial encounter: Secondary | ICD-10-CM | POA: Diagnosis not present

## 2019-05-20 DIAGNOSIS — E6609 Other obesity due to excess calories: Secondary | ICD-10-CM | POA: Diagnosis not present

## 2019-05-20 DIAGNOSIS — Z6834 Body mass index (BMI) 34.0-34.9, adult: Secondary | ICD-10-CM | POA: Diagnosis not present

## 2019-05-20 DIAGNOSIS — C44609 Unspecified malignant neoplasm of skin of left upper limb, including shoulder: Secondary | ICD-10-CM | POA: Diagnosis not present

## 2019-06-04 ENCOUNTER — Other Ambulatory Visit: Payer: Self-pay

## 2019-06-04 MED ORDER — SOLIQUA 100-33 UNT-MCG/ML ~~LOC~~ SOPN: 38.0000 [IU] | PEN_INJECTOR | Freq: Every day | SUBCUTANEOUS | 6 refills | Status: DC

## 2019-06-05 ENCOUNTER — Other Ambulatory Visit: Payer: Self-pay

## 2019-06-05 ENCOUNTER — Other Ambulatory Visit: Payer: Self-pay | Admitting: Gastroenterology

## 2019-06-05 DIAGNOSIS — K7460 Unspecified cirrhosis of liver: Secondary | ICD-10-CM

## 2019-06-05 MED ORDER — SPIRONOLACTONE 50 MG PO TABS: 50.0000 mg | ORAL_TABLET | Freq: Two times a day (BID) | ORAL | 3 refills | Status: DC

## 2019-06-06 ENCOUNTER — Other Ambulatory Visit: Payer: Self-pay | Admitting: Internal Medicine

## 2019-06-06 DIAGNOSIS — Z85828 Personal history of other malignant neoplasm of skin: Secondary | ICD-10-CM | POA: Diagnosis not present

## 2019-06-06 DIAGNOSIS — Z08 Encounter for follow-up examination after completed treatment for malignant neoplasm: Secondary | ICD-10-CM | POA: Diagnosis not present

## 2019-06-09 DIAGNOSIS — E1129 Type 2 diabetes mellitus with other diabetic kidney complication: Secondary | ICD-10-CM | POA: Diagnosis not present

## 2019-06-09 DIAGNOSIS — J449 Chronic obstructive pulmonary disease, unspecified: Secondary | ICD-10-CM | POA: Diagnosis not present

## 2019-06-09 DIAGNOSIS — E782 Mixed hyperlipidemia: Secondary | ICD-10-CM | POA: Diagnosis not present

## 2019-06-09 DIAGNOSIS — I10 Essential (primary) hypertension: Secondary | ICD-10-CM | POA: Diagnosis not present

## 2019-06-10 ENCOUNTER — Telehealth: Payer: Self-pay | Admitting: Internal Medicine

## 2019-06-10 ENCOUNTER — Other Ambulatory Visit: Payer: Self-pay

## 2019-06-10 MED ORDER — SOLIQUA 100-33 UNT-MCG/ML ~~LOC~~ SOPN: 38.0000 [IU] | PEN_INJECTOR | Freq: Every day | SUBCUTANEOUS | 6 refills | Status: DC

## 2019-06-10 NOTE — Telephone Encounter (Signed)
Resent

## 2019-06-10 NOTE — Telephone Encounter (Signed)
Patient states his pharmacy has not gotten the RX for soliqua today. Asking if we could re send it.

## 2019-06-11 ENCOUNTER — Ambulatory Visit (INDEPENDENT_AMBULATORY_CARE_PROVIDER_SITE_OTHER): Payer: Medicare HMO | Admitting: *Deleted

## 2019-06-11 DIAGNOSIS — I471 Supraventricular tachycardia: Secondary | ICD-10-CM | POA: Diagnosis not present

## 2019-06-11 LAB — CUP PACEART REMOTE DEVICE CHECK
Battery Remaining Longevity: 49 mo
Battery Remaining Percentage: 46 %
Battery Voltage: 2.86 V
Brady Statistic AP VP Percent: 1 %
Brady Statistic AP VS Percent: 68 %
Brady Statistic AS VP Percent: 1 %
Brady Statistic AS VS Percent: 32 %
Brady Statistic RA Percent Paced: 67 %
Brady Statistic RV Percent Paced: 1 %
Date Time Interrogation Session: 20210302020357
Implantable Lead Implant Date: 20130920
Implantable Lead Implant Date: 20130920
Implantable Lead Location: 753859
Implantable Lead Location: 753860
Implantable Pulse Generator Implant Date: 20130920
Lead Channel Impedance Value: 330 Ohm
Lead Channel Impedance Value: 460 Ohm
Lead Channel Pacing Threshold Amplitude: 0.5 V
Lead Channel Pacing Threshold Amplitude: 0.75 V
Lead Channel Pacing Threshold Pulse Width: 0.4 ms
Lead Channel Pacing Threshold Pulse Width: 1 ms
Lead Channel Sensing Intrinsic Amplitude: 3.6 mV
Lead Channel Sensing Intrinsic Amplitude: 4.3 mV
Lead Channel Setting Pacing Amplitude: 2 V
Lead Channel Setting Pacing Amplitude: 2.5 V
Lead Channel Setting Pacing Pulse Width: 1 ms
Lead Channel Setting Sensing Sensitivity: 1 mV
Pulse Gen Model: 2210
Pulse Gen Serial Number: 7393982

## 2019-06-12 ENCOUNTER — Other Ambulatory Visit: Payer: Self-pay

## 2019-06-12 MED ORDER — SOLIQUA 100-33 UNT-MCG/ML ~~LOC~~ SOPN: 38.0000 [IU] | PEN_INJECTOR | Freq: Every day | SUBCUTANEOUS | 6 refills | Status: DC

## 2019-06-12 NOTE — Progress Notes (Signed)
PPM Remote  

## 2019-06-20 ENCOUNTER — Encounter: Payer: Self-pay | Admitting: Internal Medicine

## 2019-06-20 ENCOUNTER — Ambulatory Visit: Payer: Medicare HMO | Admitting: Internal Medicine

## 2019-06-20 ENCOUNTER — Other Ambulatory Visit: Payer: Self-pay

## 2019-06-20 VITALS — BP 130/70 | HR 76 | Temp 99.3°F | Ht 70.0 in | Wt 229.0 lb

## 2019-06-20 DIAGNOSIS — I455 Other specified heart block: Secondary | ICD-10-CM | POA: Diagnosis not present

## 2019-06-20 DIAGNOSIS — Z950 Presence of cardiac pacemaker: Secondary | ICD-10-CM | POA: Diagnosis not present

## 2019-06-20 DIAGNOSIS — I495 Sick sinus syndrome: Secondary | ICD-10-CM | POA: Diagnosis not present

## 2019-06-20 LAB — CUP PACEART INCLINIC DEVICE CHECK
Battery Remaining Longevity: 58 mo
Battery Voltage: 2.86 V
Brady Statistic RA Percent Paced: 67 %
Brady Statistic RV Percent Paced: 0.5 %
Date Time Interrogation Session: 20210311132606
Implantable Lead Implant Date: 20130920
Implantable Lead Implant Date: 20130920
Implantable Lead Location: 753859
Implantable Lead Location: 753860
Implantable Pulse Generator Implant Date: 20130920
Lead Channel Impedance Value: 325 Ohm
Lead Channel Impedance Value: 475 Ohm
Lead Channel Pacing Threshold Amplitude: 0.5 V
Lead Channel Pacing Threshold Amplitude: 0.75 V
Lead Channel Pacing Threshold Pulse Width: 0.4 ms
Lead Channel Pacing Threshold Pulse Width: 1 ms
Lead Channel Sensing Intrinsic Amplitude: 1.6 mV
Lead Channel Sensing Intrinsic Amplitude: 4.2 mV
Lead Channel Setting Pacing Amplitude: 2 V
Lead Channel Setting Pacing Amplitude: 2.5 V
Lead Channel Setting Pacing Pulse Width: 1 ms
Lead Channel Setting Sensing Sensitivity: 1 mV
Pulse Gen Model: 2210
Pulse Gen Serial Number: 7393982

## 2019-06-20 NOTE — Progress Notes (Signed)
HPI [name] returns today for ongoing follow-up of sinus node dysfunction and paroxysmal atrial fibrillation, status post permanent pacemaker insertion. He also has a history of non-obstructive coronary artery disease and peripheral vascular disease. He denies chest pain, sob or syncope. He has some palpitations but these are better since his meds were uptitrated by Dr. Harl Bowie. Allergies  Allergen Reactions  . Nitroglycerin Hives, Swelling and Rash     Current Outpatient Medications  Medication Sig Dispense Refill  . diltiazem (CARDIZEM CD) 240 MG 24 hr capsule Take 1 capsule (240 mg total) by mouth daily. 90 capsule 3  . furosemide (LASIX) 20 MG tablet TAKE 2 TABLETS BY MOUTH DAILY AS NEEDED FOR SWELLING 180 tablet 3  . Insulin Glargine-Lixisenatide (SOLIQUA) 100-33 UNT-MCG/ML SOPN Inject 38 Units into the skin daily with breakfast. 30 mL 6  . nadolol (CORGARD) 40 MG tablet TAKE 1 TABLET IN THE MORNING AND 1/2 TABLET BY MOUTH EVERY EVENING (Patient taking differently: Takes 1 tablet in the morning and 1 tablet in the evening.) 135 tablet 2  . pantoprazole (PROTONIX) 40 MG tablet Take 1 tablet (40 mg total) by mouth daily. (Patient taking differently: Take 40 mg by mouth daily as needed. ) 90 tablet 3  . rivaroxaban (XARELTO) 20 MG TABS tablet TAKE 1 TABLET BY MOUTH EVERY DAY WITH DINNER 90 tablet 0  . simvastatin (ZOCOR) 20 MG tablet TAKE 1 TABLET BY MOUTH EVERY DAY IN THE EVENING 90 tablet 3  . spironolactone (ALDACTONE) 50 MG tablet Take 1 tablet (50 mg total) by mouth 2 (two) times daily. 180 tablet 3  . Blood Glucose Monitoring Suppl (ACCU-CHEK AVIVA) device by Other route. Use as instructed to check blood sugar 2 times daily    . dapagliflozin propanediol (FARXIGA) 10 MG TABS tablet Take 10 mg by mouth daily. 90 tablet 0  . flecainide (TAMBOCOR) 100 MG tablet TAKE 1 TABLET BY MOUTH TWICE A DAY 180 tablet 3  . Insulin Pen Needle (BD PEN NEEDLE MICRO U/F) 32G X 6 MM MISC Daily  50 each 6  . levothyroxine (SYNTHROID, LEVOTHROID) 112 MCG tablet TAKE 1 TABLET BY MOUTH EVERY MORNING BEFORE BREAKFAST (Patient taking differently: Take 112 mcg by mouth every morning. ) 30 tablet 0  . metFORMIN (GLUCOPHAGE) 1000 MG tablet Take 1 tablet (1,000 mg total) by mouth 2 (two) times daily with a meal. 180 tablet 3  . Omega-3 Fatty Acids (FISH OIL) 1200 MG CAPS Take by mouth 2 (two) times daily.    . propranolol (INDERAL) 20 MG tablet TAKE 1 TABLET BY MOUTH TWICE A DAY 180 tablet 3   No current facility-administered medications for this visit.     Past Medical History:  Diagnosis Date  . Anginal pain ([place])   . Arthritis    "back; fingers" (01/06/2012)  . Atrial flutter [facility])    s/p EPS +RF ablation of typical atrial flutter April 2015  . Cancer ([place])    skin cancer  . CHB (complete heart block) ([place])   . CHF (congestive heart failure) ([place]) 01/06/2012  . Chronic lower back pain   . Cirrhosis ([place])    NASH-Hep A and B immune  . Coughing up blood    "comes from my throat" (01/06/2012)  . DDD (degenerative disc disease), lumbar   . Depressed   . Difficult intubation    Eschmann stylet used in 2002 and 2007; "trouble waking up afterwards" (01/06/2012)  . Emphysema   . Fatty liver disease,  nonalcoholic   . GERD (gastroesophageal reflux disease)   . H/O hiatal hernia   . Headache   . History of esophageal varices   . Hypertension   . Hypothyroidism   . Orthostatic dizziness   . Pacemaker   . Pneumonia Aug 2016  . Presence of permanent cardiac pacemaker 9/292013   St.Jude  . Sinus pause 01/06/2012   5.2 seconds  . Sleep apnea    "don't wear mask" (01/06/2012)  . Stroke [facility])    pt states that he might have had a stroke not sure  . Type II diabetes mellitus ([place])   . Varicose vein    of esophagus    ROS:   All systems reviewed and negative except as noted in the HPI.   Past Surgical History:  Procedure Laterality Date  . ATRIAL FLUTTER ABLATION N/A 07/10/2013    Procedure: ATRIAL FLUTTER ABLATION;  Surgeon: Evans Lance, MD;  Location: [facility] CATH LAB;  Service: Cardiovascular;  Laterality: N/A;  . BACK SURGERY    . CHOLECYSTECTOMY  1993  . COLONOSCOPY  11/08/2004   [id number] rectum, colon, TI.  Marland Kitchen COLONOSCOPY N/A 05/28/2014   Dr. Gala Romney: Redundant colon. single colonic polyp removed as described above. Tubular adenoma  . ESOPHAGEAL DILATION N/A 05/28/2014   Procedure: ESOPHAGEAL DILATION;  Surgeon: Daneil Dolin, MD;  [facility];  Service: Endoscopy;  Laterality: N/A;  . ESOPHAGOGASTRODUODENOSCOPY  11/08/2004   [id number] esophageal erosions consistent with erosive reflux esophagitis/Areas of hemorrhage and nodularity of the fundal mucosa of uncertain significance, biopsied.  Small hiatal hernia, otherwise normal stomach  . ESOPHAGOGASTRODUODENOSCOPY  2010   Dr. Gala Romney: 3 columns Grade 1 varices, erosive esophagitis, HH, portal gastropathy, normal D1, D2  . ESOPHAGOGASTRODUODENOSCOPY N/A 05/28/2014   Dr. Gala Romney: MIld erosive reflux esophagitis. Grade 1 esophageal varices. Patent esophagus. No dilation performed. Hiatal hernia.   Marland Kitchen ESOPHAGOGASTRODUODENOSCOPY (EGD) WITH ESOPHAGEAL DILATION N/A 02/14/2013   [id number] 1 esophageal varices. Abnormal distal esophagus/status post biopsy after Maloney dilation. Portal gastropathy. Antral erosions-status post biopsy. path negative for H.pylori, benign path.  . LUMBAR [place]; ~ 1995; ~ 1996  . NASAL SEPTUM SURGERY  1992  . nuclear stress test  10/19/2004   No ischemia  . PERMANENT PACEMAKER INSERTION  01/08/2012   CHB  . PERMANENT PACEMAKER INSERTION N/A 01/09/2012   Procedure: PERMANENT PACEMAKER INSERTION;  Surgeon: Sanda Klein, MD;  Location: [place] CATH LAB;  Service: Cardiovascular;  Laterality: N/A;  . POSTERIOR FUSION LUMBAR SPINE  1999   L4-5  . SPINAL CORD STIMULATOR IMPLANT  2006  . SPINAL CORD STIMULATOR REMOVAL N/A 01/27/2015   Procedure: LUMBAR SPINAL CORD STIMULATOR REMOVAL;   Surgeon: Kristeen Miss, MD;  Location: [place] NEURO ORS;  Service: Neurosurgery;  Laterality: N/A;  LUMBAR SPINAL CORD STIMULATOR REMOVAL  . TONSILLECTOMY AND ADENOIDECTOMY  1992  . US ECHOCARDIOGRAPHY  12/28/2011   mild LVH,mild mitral annulara ca+,mild MR  . 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 Cancer Mother        Deceased, 44  . Ovarian cancer Mother   . Arrhythmia Father   . Other Father        Deceased 71  . Stroke Brother   . Stroke Brother   . Stroke Sister   . Crohn's disease Daughter   . Diabetes Maternal Grandmother   . Colon cancer Neg Hx  Social History   Socioeconomic History  . Marital status: Married    Spouse name: Not on file  . Number of children: Not on file  . Years of education: Not on file  . Highest education level: Not on file  Occupational History  . Not on file  Tobacco Use  . Smoking status: Current Some Day Smoker    Packs/day: 0.25    Years: 45.00    Pack years: 11.25    Types: Cigarettes    Start date: 04/11/1968    Last attempt to quit: 04/11/2014    Years since quitting: 5.1  . Smokeless tobacco: Never Used  . Tobacco comment: Quit x 8 months this time  Substance and Sexual Activity  . Alcohol use: No    Alcohol/week: 0.0 standard drinks    Comment: "quit alcohol 2011" Previously drinking socially about twice per month  . Drug use: No  . Sexual activity: Never  Other Topics Concern  . Not on file  Social History Narrative   Lives with wife in a one story home.  Has 3 children.     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     Education: some college.   Social Determinants of Health   Financial Resource Strain:   . Difficulty of Paying Living Expenses:   Food Insecurity:   . Worried About Charity fundraiser in the Last Year:   . Arboriculturist in the Last Year:   Transportation Needs:   . Film/video editor (Medical):   Marland Kitchen Lack of Transportation (Non-Medical):     Physical Activity:   . Days of Exercise per Week:   . Minutes of Exercise per Session:   Stress:   . Feeling of Stress :   Social Connections:   . Frequency of Communication with Friends and Family:   . Frequency of Social Gatherings with Friends and Family:   . Attends Religious Services:   . Active Member of Clubs or Organizations:   . Attends Archivist Meetings:   Marland Kitchen Marital Status:   Intimate Partner Violence:   . Fear of Current or Ex-Partner:   . Emotionally Abused:   Marland Kitchen Physically Abused:   . Sexually Abused:      Temp 99.3 F (37.4 C)   Ht 5\' 10"  (1.778 m)   BMI 32.28 kg/m   Physical Exam:  Well appearing NAD HEENT: Unremarkable Neck:  No JVD, no thyromegally Lymphatics:  No adenopathy Back:  No CVA tenderness Lungs:  Clear with no wheezes HEART:  Regular rate rhythm, no murmurs, no rubs, no clicks Abd:  soft, positive bowel sounds, no organomegally, no rebound, no guarding Ext:  2 plus pulses, no edema, no cyanosis, no clubbing Skin:  No rashes no nodules Neuro:  CN II through XII intact, motor grossly intact  DEVICE  Normal device function.  See PaceArt for details.   Assess/Plan: 1. SVT/Atrial flutter/tachycardia - his last episode was in May 2020. He will continue his current meds. 2. Sinus node dysfunction - he is pacing 2/3 of the time in the atrium. He is asymptomatic, s/p PPM insertion. 3. HTN - his SBP is reasonably well controlled. He is encouraged to lose weight.   Mikle Bosworth.D.

## 2019-06-20 NOTE — Patient Instructions (Signed)
Medication Instructions:  Your physician recommends that you continue on your current medications as directed. Please refer to the Current Medication list given to you today.  *If you need a refill on your cardiac medications before your next appointment, please call your pharmacy*   Lab Work: NONE   If you have labs (blood work) drawn today and your tests are completely normal, you will receive your results only by: . MyChart Message (if you have MyChart) OR . A paper copy in the mail If you have any lab test that is abnormal or we need to change your treatment, we will call you to review the results.   Testing/Procedures: NONE    Follow-Up: At [facility], you and your health needs are our priority.  As part of our continuing mission to provide you with exceptional heart care, we have created designated Provider Care Teams.  These Care Teams include your primary Cardiologist (physician) and Advanced Practice Providers (APPs -  Physician Assistants and Nurse Practitioners) who all work together to provide you with the care you need, when you need it.  We recommend signing up for the patient portal called "MyChart".  Sign up information is provided on this After Visit Summary.  MyChart is used to connect with patients for Virtual Visits (Telemedicine).  Patients are able to view lab/test results, encounter notes, upcoming appointments, etc.  Non-urgent messages can be sent to your provider as well.   To learn more about what you can do with MyChart, go to [email or web address].    Your next appointment:   1 month(s)  The format for your next appointment:   In Person  Provider:   Gregg Taylor, MD   Other Instructions Thank you for choosing [facility]!    

## 2019-08-09 DIAGNOSIS — E1129 Type 2 diabetes mellitus with other diabetic kidney complication: Secondary | ICD-10-CM | POA: Diagnosis not present

## 2019-08-09 DIAGNOSIS — I10 Essential (primary) hypertension: Secondary | ICD-10-CM | POA: Diagnosis not present

## 2019-08-09 DIAGNOSIS — J449 Chronic obstructive pulmonary disease, unspecified: Secondary | ICD-10-CM | POA: Diagnosis not present

## 2019-08-26 NOTE — Progress Notes (Signed)
Name: Ralph [name]  Age/ Sex: 69 y.o., male   MRN/ DOB: [id number], [birth year]     PCP: Sharilyn Sites, MD   Reason for Endocrinology Evaluation: Type 2 Diabetes Mellitus  Initial Endocrine Consultative Visit: 04/18/2018    PATIENT IDENTIFIER: Ralph [name] is a 69 y.o. male with a past medical history of A.Fib (S/P Pacemaker), CHF, HTN, Hypothyroidism and T2DM. The patient has followed with Endocrinology clinic since 04/18/2017 for consultative assistance with management of his diabetes.  DIABETIC HISTORY:  Ralph [name] was diagnosed with T2DM in ~ 2009, he has been on oral glycemic agents for years, initially was only on metformin followed by farxiga and levemir added ~ 6 yrs ago but was unable to afford it. His hemoglobin A1c has ranged from 5.7% in 12/2015, peaking at 7.5% in 06/2015.   SUBJECTIVE:   During the last visit (04/29/2019): A1c 7.5 %. We increased soliqua , continued metformin and [place]     Today (08/27/2019): Ralph [name] is here for 4 month  follow-up appointment on diabetes management He checks his blood sugars 2 times daily, fasting and bedtime. The patient has not had hypoglycemic episodes since the last clinic visit. Otherwise, the patient has not required any recent emergency interventions for hypoglycemia and has not had recent hospitalizations secondary to hyper or hypoglycemic episodes.    ROS: As per HPI and as detailed below: Review of Systems  Constitutional: Negative for fever and weight loss.  Respiratory: Negative for cough and shortness of breath.   Cardiovascular: Negative for chest pain and palpitations.  Gastrointestinal: Positive for nausea. Negative for diarrhea and vomiting.    Has chronic nausea.  Denies constipation and diarrhea.  Pt with chronic dysphagia     HOME DIABETES REGIMEN:  - Metformin 1000 mg Twice a day with meals  - Farxiga 10 mg once a day  - Soliqua 38 units daily     METER DOWNLOAD SUMMARY: Date range evaluated:  5/5-5/18/2021 Fingerstick Blood Glucose Tests = 27 Overall Mean FS Glucose = 193 Glucose Checks per Day = 1.9  BG Ranges: Low = 141 High = 238   Hypoglycemic Events/30 Days: BG < 50 =0 Episodes of symptomatic severe hypoglycemia = 0  DIABETIC COMPLICATIONS: Microvascular complications:  Neuropathy Denies: retinopathy  Last eye exam: Completed 04/2018  Macrovascular complications:  CHF  Denies: CAD, PVD, CVA  HISTORY:  Past Medical History:  Past Medical History:  Diagnosis Date  . Anginal pain ([place])   . Arthritis    "back; fingers" (01/06/2012)  . Atrial flutter [facility])    s/p EPS +RF ablation of typical atrial flutter April 2015  . Cancer ([place])    skin cancer  . CHB (complete heart block) ([place])   . CHF (congestive heart failure) ([place]) 01/06/2012  . Chronic lower back pain   . 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 Coughing up blood    "comes from my throat" (01/06/2012)  . DDD (degenerative disc disease), lumbar   . Depressed   . Difficult intubation    Eschmann stylet used in 2002 and 2007; "trouble waking up afterwards" (01/06/2012)  . Emphysema   . Fatty liver disease, nonalcoholic   . GERD (gastroesophageal reflux disease)   . H/O hiatal hernia   . Headache   . History of esophageal varices   . Hypertension   . Hypothyroidism   . Orthostatic dizziness   . Pacemaker   . Pneumonia Aug 2016  .  Presence of permanent cardiac pacemaker 9/292013   [employer]  . Sinus pause 01/06/2012   5.2 seconds  . Sleep apnea    "don't wear mask" (01/06/2012)  . Stroke [facility])    pt states that he might have had a stroke not sure  . Type II diabetes mellitus ([place])   . Varicose vein    of esophagus   Past Surgical History:  Past Surgical History:  Procedure Laterality Date  . ATRIAL FLUTTER ABLATION N/A 07/10/2013   Procedure: ATRIAL FLUTTER ABLATION;  Surgeon: Evans Lance, MD;  Location: [facility] CATH LAB;  Service: Cardiovascular;  Laterality: N/A;  . BACK SURGERY     . CHOLECYSTECTOMY  1993  . COLONOSCOPY  11/08/2004   [id number] rectum, colon, TI.  Marland Kitchen COLONOSCOPY N/A 05/28/2014   Dr. Gala Romney: Redundant colon. single colonic polyp removed as described above. Tubular adenoma  . ESOPHAGEAL DILATION N/A 05/28/2014   Procedure: ESOPHAGEAL DILATION;  Surgeon: Daneil Dolin, MD;  Location[facility] ENDO [facility];  Service: Endoscopy;  Laterality: N/A;  . ESOPHAGOGASTRODUODENOSCOPY  11/08/2004   [id number] esophageal erosions consistent with erosive reflux esophagitis/Areas of hemorrhage and nodularity of the fundal mucosa of uncertain significance, biopsied.  Small hiatal hernia, otherwise normal stomach  . ESOPHAGOGASTRODUODENOSCOPY  2010   Dr. Gala Romney: 3 columns Grade 1 varices, erosive esophagitis, HH, portal gastropathy, normal D1, D2  . ESOPHAGOGASTRODUODENOSCOPY N/A 05/28/2014   Dr. Gala Romney: MIld erosive reflux esophagitis. Grade 1 esophageal varices. Patent esophagus. No dilation performed. Hiatal hernia.   Marland Kitchen ESOPHAGOGASTRODUODENOSCOPY (EGD) WITH ESOPHAGEAL DILATION N/A 02/14/2013   [id number] 1 esophageal varices. Abnormal distal esophagus/status post biopsy after Maloney dilation. Portal gastropathy. Antral erosions-status post biopsy. path negative for H.pylori, benign path.  . LUMBAR [place]; ~ 1995; ~ 1996  . NASAL SEPTUM SURGERY  1992  . nuclear stress test  10/19/2004   No ischemia  . PERMANENT PACEMAKER INSERTION  01/08/2012   CHB  . PERMANENT PACEMAKER INSERTION N/A 01/09/2012   Procedure: PERMANENT PACEMAKER INSERTION;  Surgeon: Sanda Klein, MD;  Location: [facility] CATH LAB;  Service: Cardiovascular;  Laterality: N/A;  . POSTERIOR FUSION LUMBAR SPINE  1999   L4-5  . SPINAL CORD STIMULATOR IMPLANT  2006  . SPINAL CORD STIMULATOR REMOVAL N/A 01/27/2015   Procedure: LUMBAR SPINAL CORD STIMULATOR REMOVAL;  Surgeon: Kristeen Miss, MD;  Location: [place] NEURO ORS;  Service: Neurosurgery;  Laterality: N/A;  LUMBAR SPINAL CORD STIMULATOR REMOVAL  . TONSILLECTOMY AND  ADENOIDECTOMY  1992  . US ECHOCARDIOGRAPHY  12/28/2011   mild LVH,mild mitral annulara ca+,mild MR  . 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 His smoking use included cigarettes. He started smoking about 51 years ago. He has a 11.25 pack-year smoking history. He has never used smokeless tobacco. He reports that he does not drink alcohol or use drugs. Family History:  Family History  Problem Relation Age of Onset  . Cancer Mother        Deceased, 75  . Ovarian cancer Mother   . Arrhythmia Father   . Other Father        Deceased 17  . Stroke Brother   . Stroke Brother   . Stroke Sister   . Crohn's disease Daughter   . Diabetes Maternal Grandmother   . Colon cancer Neg Hx      HOME MEDICATIONS: Allergies as of 08/27/2019      Reactions   Nitroglycerin Hives,  Swelling, Rash      Medication List       Accurate as of Aug 27, 2019  7:31 AM. If you have any questions, ask your nurse or doctor.        Accu-Chek Aviva device by Other route. Use as instructed to check blood sugar 2 times daily   dapagliflozin propanediol 10 MG Tabs tablet Commonly known as: Farxiga Take 10 mg by mouth daily.   diazepam 10 MG tablet Commonly known as: VALIUM Take 1 tablet by mouth daily as needed.   diltiazem 240 MG 24 hr capsule Commonly known as: CARDIZEM CD Take 1 capsule (240 mg total) by mouth daily.   escitalopram 10 MG tablet Commonly known as: LEXAPRO Take 1 tablet by mouth daily.   Fish Oil 1200 MG Caps Take by mouth 2 (two) times daily.   flecainide 100 MG tablet Commonly known as: TAMBOCOR TAKE 1 TABLET BY MOUTH TWICE A DAY   furosemide 20 MG tablet Commonly known as: LASIX TAKE 2 TABLETS BY MOUTH DAILY AS NEEDED FOR SWELLING   Insulin Pen Needle 32G X 6 MM Misc Commonly known as: BD Pen Needle Micro U/F Daily   levothyroxine 112 MCG tablet Commonly known as: SYNTHROID TAKE 1 TABLET BY MOUTH EVERY MORNING  BEFORE BREAKFAST What changed: See the new instructions.   metFORMIN 1000 MG tablet Commonly known as: GLUCOPHAGE Take 1 tablet (1,000 mg total) by mouth 2 (two) times daily with a meal.   nadolol 40 MG tablet Commonly known as: CORGARD Take 40 mg by mouth 2 (two) times daily.   pantoprazole 40 MG tablet Commonly known as: PROTONIX Take 1 tablet (40 mg total) by mouth daily. What changed:  when to take this reasons to take this   propranolol 20 MG tablet Commonly known as: INDERAL TAKE 1 TABLET BY MOUTH TWICE A DAY   rivaroxaban 20 MG Tabs tablet Commonly known as: Xarelto TAKE 1 TABLET BY MOUTH EVERY DAY WITH DINNER   simvastatin 20 MG tablet Commonly known as: ZOCOR TAKE 1 TABLET BY MOUTH EVERY DAY IN THE EVENING   Soliqua 100-33 UNT-MCG/ML Sopn Generic drug: Insulin Glargine-Lixisenatide Inject 38 Units into the skin daily with breakfast.   spironolactone 50 MG tablet Commonly known as: ALDACTONE Take 1 tablet (50 mg total) by mouth 2 (two) times daily.        OBJECTIVE:   Vital Signs: BP 126/62 (BP Location: Left Arm, Patient Position: Sitting, Cuff Size: Large)   Pulse 69   Temp 98.6 F (37 C)   Ht 5\' 10"  (1.778 m)   Wt 229 lb 9.6 oz (104.1 kg)   SpO2 97%   BMI 32.94 kg/m   Wt Readings from Last 3 Encounters:  08/27/19 229 lb 9.6 oz (104.1 kg)  06/20/19 229 lb (103.9 kg)  05/16/19 225 lb (102.1 kg)   Exam: General: Pt appears well and is in NAD  Lungs: Clear with good BS bilat with no rales, rhonchi, or wheezes  Heart: RRR with normal S1 and S2 and no gallops; no murmurs; no rub  Abdomen: Normoactive bowel sounds, soft, nontender, without masses or organomegaly palpable  Extremities: Trace pretibial edema.   Neuro: MS is good with appropriate affect, pt is alert and Ox3   DM foot exam: 04/29/2019    The skin of the feet is intact without sores or ulcerations. Multiple plantar callous formation noted The pedal pulses are 2+ on right and 2+ on  left. The sensation is decreased  to a screening 5.07, 10 gram monofilament bilaterally    DATA REVIEWED: 01/15/2019  BUN/Cr 22/0.850  ASSESSMENT / PLAN / RECOMMENDATIONS:   1)Type 2 Diabetes Mellitus, Sub-Optimally  controlled With Neuropathic  complications - Most recent A1c of 7.9 %. Goal A1c <7.0 %.    - A1c slightly elevated  - Will increase Soliqua as below  - He is maxed out on metformin and farxia, if this regimen will not work, will consider switching Soliqua to an insulin Mix   MEDICATIONS: Continue metformin 1000 mg twice a day with meals Continue Farxiga 10 mg once a day Increase Soliqua to 45 units daily with breakfast    EDUCATION / INSTRUCTIONS: BG monitoring instructions: Patient is instructed to check his blood sugars 2 times a day, fasting and bedtime. Call [facility] clinic if: BG persistently < 70 or > 300. I reviewed the Rule of 15 for the treatment of hypoglycemia in detail with the patient. Literature supplied.    F/U in 4 months    Signed electronically by: Mack Guise, MD  [facility] Endocrinology  [facility] [address] [place],  [address] Phone: [phone] FAX: [phone]   CC: Sharilyn Sites, [place] [id number] Phone: [phone]  Fax: [phone]  Return to Endocrinology clinic as below: Future Appointments  Date Time Provider [place]  09/10/2019  7:50 AM [facility] DEVICE REMOTES [facility]  11/19/2019 10:00 AM Branch, Alphonse Guild, MD [facility] [place] [facility]  12/10/2019  7:50 AM [facility] DEVICE REMOTES [facility]  03/10/2020  7:50 AM [facility] DEVICE REMOTES [facility]

## 2019-08-27 ENCOUNTER — Ambulatory Visit: Payer: Medicare HMO | Admitting: Internal Medicine

## 2019-08-27 ENCOUNTER — Encounter: Payer: Self-pay | Admitting: Internal Medicine

## 2019-08-27 ENCOUNTER — Other Ambulatory Visit: Payer: Self-pay

## 2019-08-27 VITALS — BP 126/62 | HR 69 | Temp 98.6°F | Ht 70.0 in | Wt 229.6 lb

## 2019-08-27 DIAGNOSIS — E1142 Type 2 diabetes mellitus with diabetic polyneuropathy: Secondary | ICD-10-CM

## 2019-08-27 LAB — POCT GLYCOSYLATED HEMOGLOBIN (HGB A1C): Hemoglobin A1C: 7.9 % — AB (ref 4.0–5.6)

## 2019-08-27 MED ORDER — SOLIQUA 100-33 UNT-MCG/ML ~~LOC~~ SOPN: 45.0000 [IU] | PEN_INJECTOR | Freq: Every day | SUBCUTANEOUS | 6 refills | Status: DC

## 2019-08-27 NOTE — Patient Instructions (Signed)
-   Increase Soliqua to 45 units daily with breakfast  - Continue Metformin 1000 mg twice a day  - Continue Farxiga 10 mg daily       -HOW TO TREAT LOW BLOOD SUGARS (Blood sugar LESS THAN 70 MG/DL)  Please follow the RULE OF 15 for the treatment of hypoglycemia treatment (when your (blood sugars are less than 70 mg/dL)    STEP 1: Take 15 grams of carbohydrates when your blood sugar is low, which includes:   3-4 GLUCOSE TABS  OR  3-4 OZ OF JUICE OR REGULAR SODA OR  ONE TUBE OF GLUCOSE GEL     STEP 2: RECHECK blood sugar in 15 MINUTES STEP 3: If your blood sugar is still low at the 15 minute recheck --> then, go back to STEP 1 and treat AGAIN with another 15 grams of carbohydrates.

## 2019-09-06 DIAGNOSIS — E1129 Type 2 diabetes mellitus with other diabetic kidney complication: Secondary | ICD-10-CM | POA: Diagnosis not present

## 2019-09-06 DIAGNOSIS — I10 Essential (primary) hypertension: Secondary | ICD-10-CM | POA: Diagnosis not present

## 2019-09-06 DIAGNOSIS — J449 Chronic obstructive pulmonary disease, unspecified: Secondary | ICD-10-CM | POA: Diagnosis not present

## 2019-09-06 DIAGNOSIS — E782 Mixed hyperlipidemia: Secondary | ICD-10-CM | POA: Diagnosis not present

## 2019-09-10 ENCOUNTER — Ambulatory Visit (INDEPENDENT_AMBULATORY_CARE_PROVIDER_SITE_OTHER): Payer: Medicare HMO | Admitting: *Deleted

## 2019-09-10 DIAGNOSIS — I471 Supraventricular tachycardia: Secondary | ICD-10-CM

## 2019-09-10 DIAGNOSIS — I4891 Unspecified atrial fibrillation: Secondary | ICD-10-CM

## 2019-09-11 LAB — CUP PACEART REMOTE DEVICE CHECK
Battery Remaining Longevity: 43 mo
Battery Remaining Percentage: 40 %
Battery Voltage: 2.84 V
Brady Statistic AP VP Percent: 1 %
Brady Statistic AP VS Percent: 63 %
Brady Statistic AS VP Percent: 1 %
Brady Statistic AS VS Percent: 37 %
Brady Statistic RA Percent Paced: 63 %
Brady Statistic RV Percent Paced: 1 %
Date Time Interrogation Session: 20210601073626
Implantable Lead Implant Date: 20130920
Implantable Lead Implant Date: 20130920
Implantable Lead Location: 753859
Implantable Lead Location: 753860
Implantable Pulse Generator Implant Date: 20130920
Lead Channel Impedance Value: 330 Ohm
Lead Channel Impedance Value: 430 Ohm
Lead Channel Pacing Threshold Amplitude: 0.5 V
Lead Channel Pacing Threshold Amplitude: 0.75 V
Lead Channel Pacing Threshold Pulse Width: 0.4 ms
Lead Channel Pacing Threshold Pulse Width: 1 ms
Lead Channel Sensing Intrinsic Amplitude: 1.4 mV
Lead Channel Sensing Intrinsic Amplitude: 4.1 mV
Lead Channel Setting Pacing Amplitude: 2 V
Lead Channel Setting Pacing Amplitude: 2.5 V
Lead Channel Setting Pacing Pulse Width: 1 ms
Lead Channel Setting Sensing Sensitivity: 1 mV
Pulse Gen Model: 2210
Pulse Gen Serial Number: 7393982

## 2019-09-11 NOTE — Progress Notes (Signed)
Remote pacemaker transmission.   

## 2019-09-17 DIAGNOSIS — E119 Type 2 diabetes mellitus without complications: Secondary | ICD-10-CM | POA: Diagnosis not present

## 2019-09-17 LAB — HM DIABETES EYE EXAM

## 2019-10-08 ENCOUNTER — Other Ambulatory Visit: Payer: Self-pay | Admitting: Internal Medicine

## 2019-10-09 DIAGNOSIS — I10 Essential (primary) hypertension: Secondary | ICD-10-CM | POA: Diagnosis not present

## 2019-10-09 DIAGNOSIS — J449 Chronic obstructive pulmonary disease, unspecified: Secondary | ICD-10-CM | POA: Diagnosis not present

## 2019-10-09 DIAGNOSIS — E1129 Type 2 diabetes mellitus with other diabetic kidney complication: Secondary | ICD-10-CM | POA: Diagnosis not present

## 2019-10-17 IMAGING — CT CT HEAD W/O CM
3 series · 15 of 47 positions shown, 18 images · non-contrast
Comparison: None.

CLINICAL DATA: Increased blood sugar. Change in mental status.
Headache.

EXAM:
CT HEAD WITHOUT CONTRAST
TECHNIQUE: Contiguous axial images were obtained from the base of the skull
through the vertex without intravenous contrast.

[Series 2: head wo · axial · 0.45mm/px · z∈[+1376,+1506]mm · 9 of 32 slices shown, 12 images]
[im 3/32  brain]
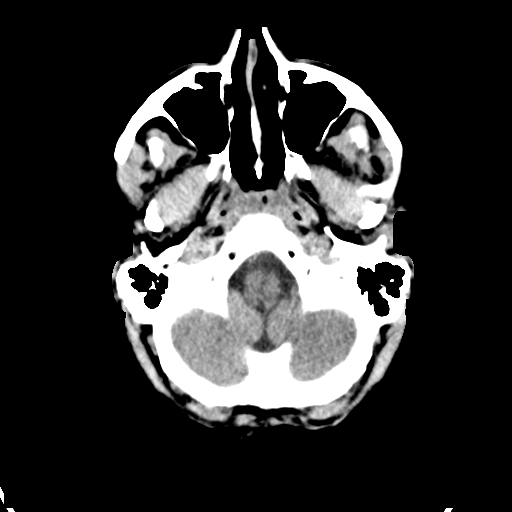
[im 3/32  bone]
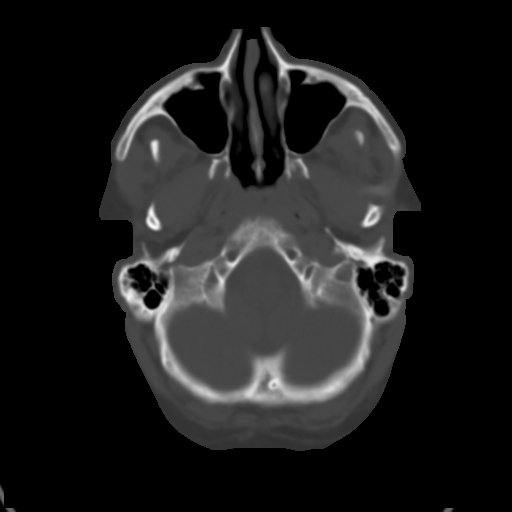
[im 6/32  brain]
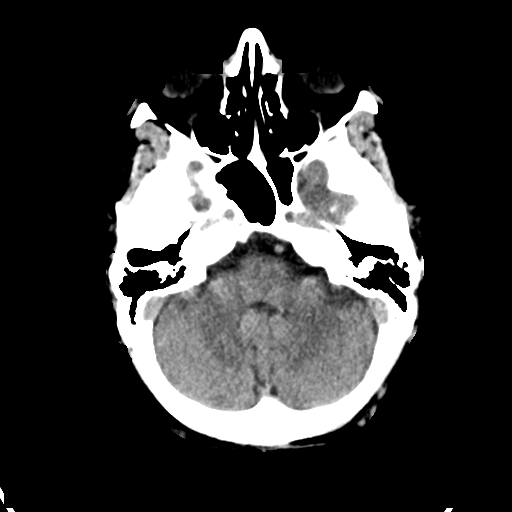
[im 9/32  brain]
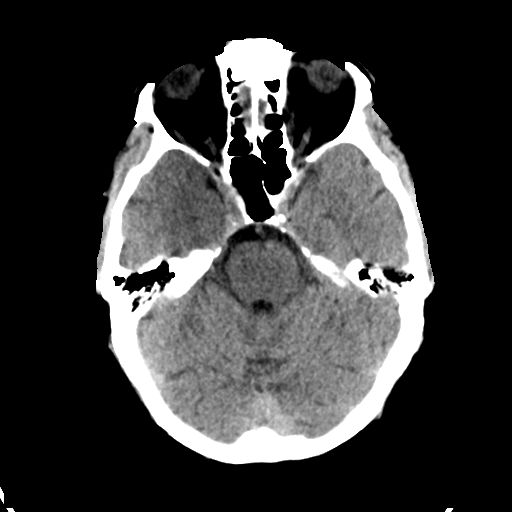
[im 12/32  brain]
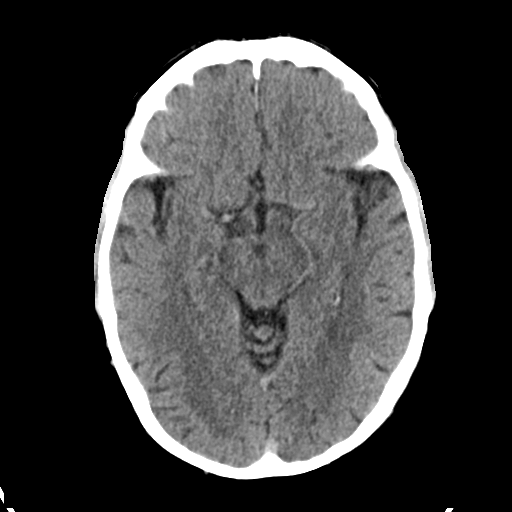
[im 17/32  brain]
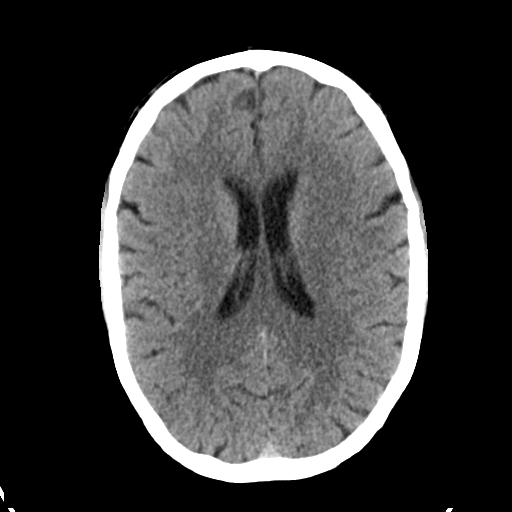
[im 17/32  bone]
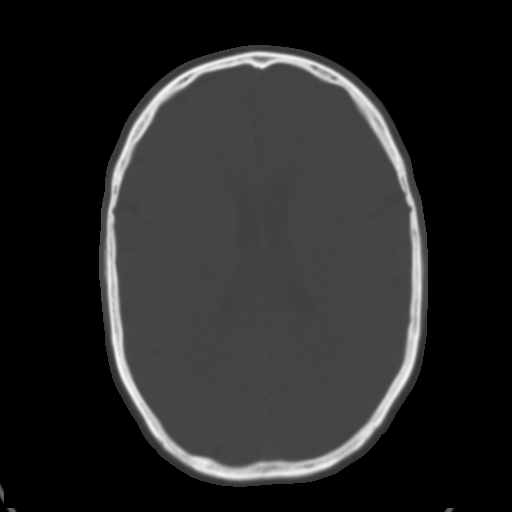
[im 20/32  brain]
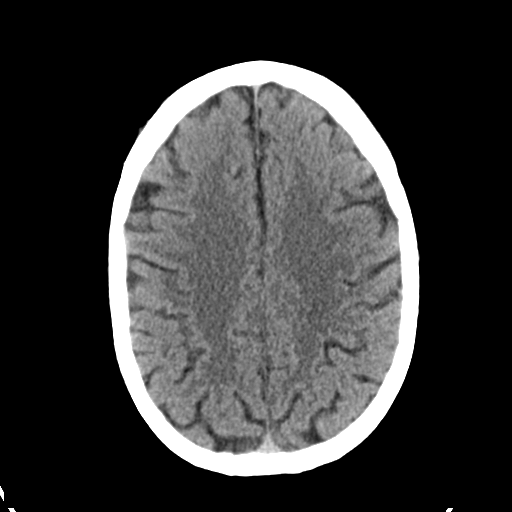
[im 23/32  brain]
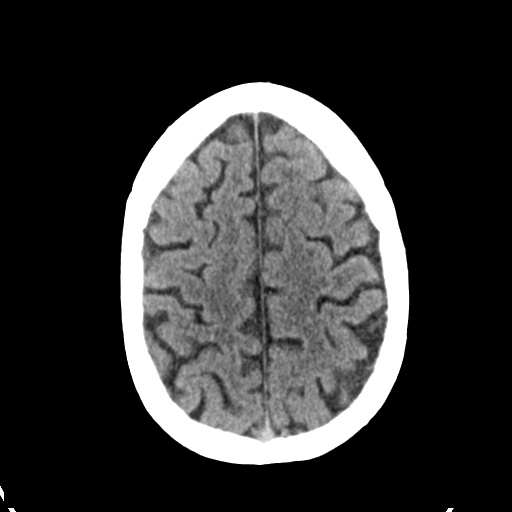
[im 26/32  brain]
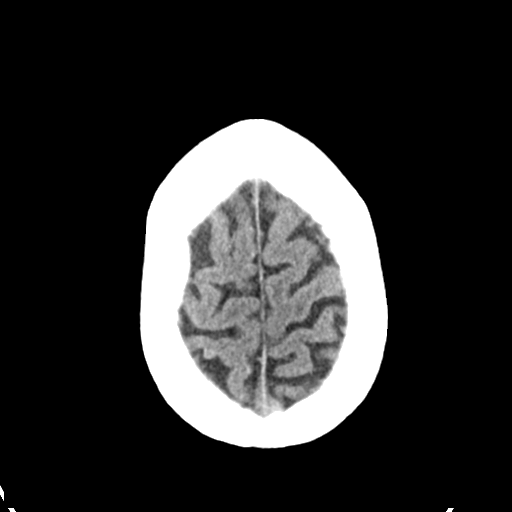
[im 29/32  brain]
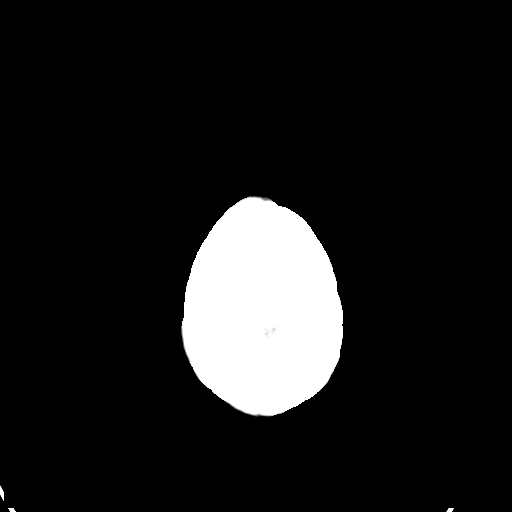
[im 29/32  bone]
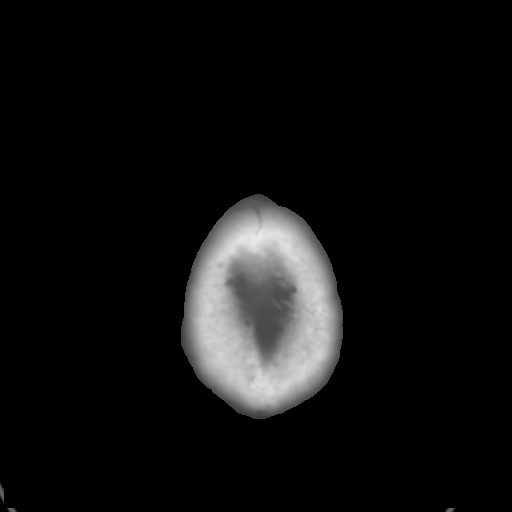

[Series 4: coronal soft tissue · coronal · 0.31mm/px · 3 of 73 slices shown]
[im 25/73  brain]
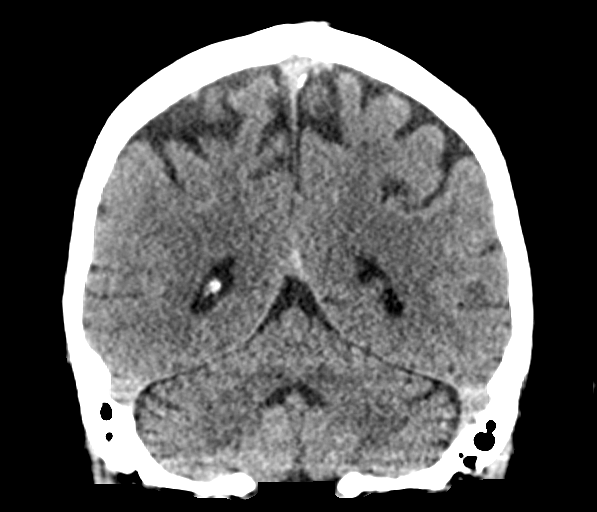
[im 33/73  brain]
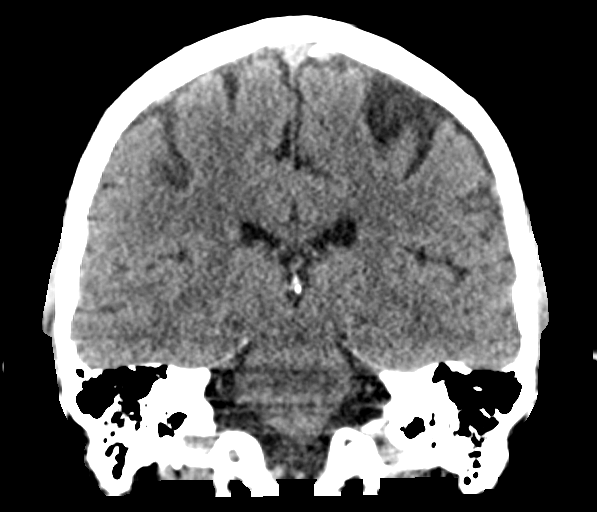
[im 41/73  brain]
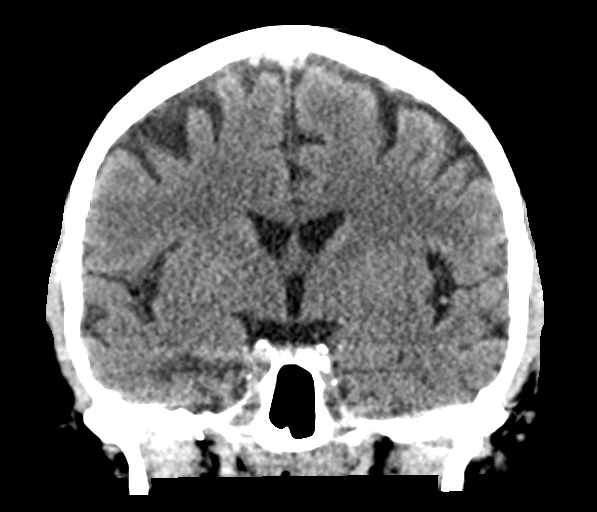

[Series 5: sagittal soft tissue · sagittal · 0.31mm/px · 3 of 60 slices shown]
[im 20/60  brain]
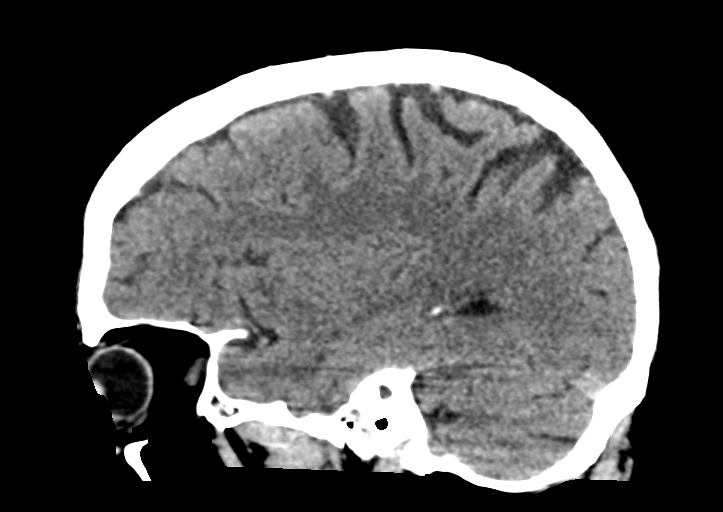
[im 30/60  brain]
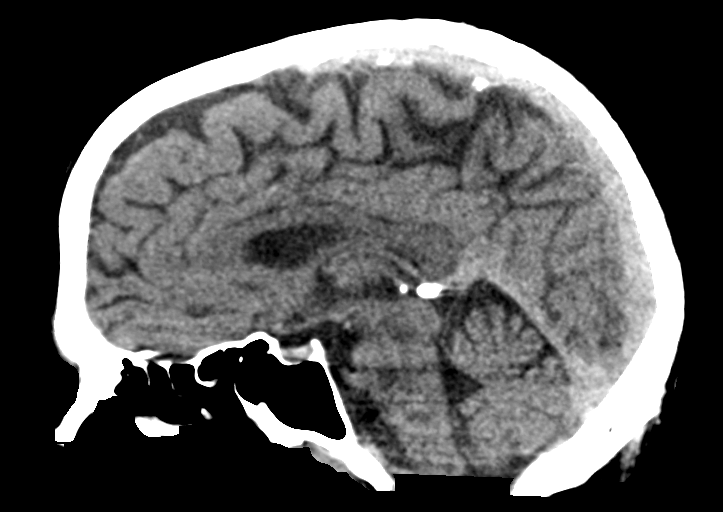
[im 40/60  brain]
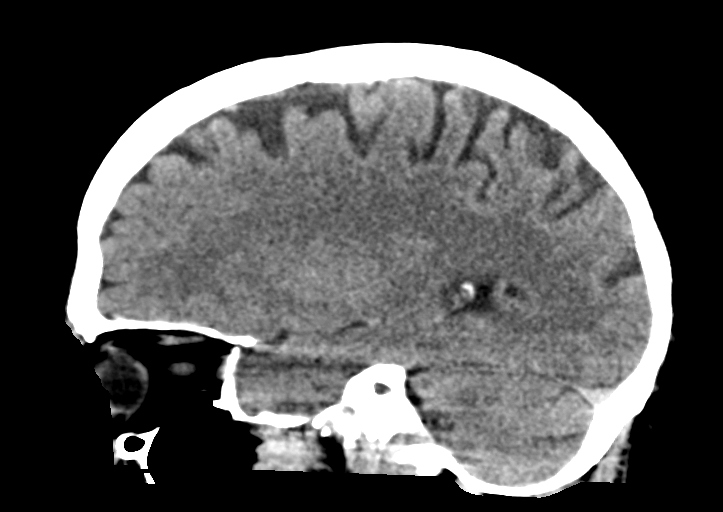

[15 of 47 positions shown; findings below may reference images not displayed]

FINDINGS: Brain: No subdural, epidural, or subarachnoid hemorrhage.
Cerebellum, brainstem, and basal cisterns are. The ventricles and
sulci are normal. Minimal white matter changes. No acute cortical
ischemia or infarct. No mass effect or midline shift.

Vascular: No hyperdense vessel or unexpected calcification.

Skull: Normal. Negative for fracture or focal lesion.

Sinuses/Orbits: No acute finding.

Other: None.
IMPRESSION: No acute intracranial abnormalities.

## 2019-11-19 ENCOUNTER — Telehealth (INDEPENDENT_AMBULATORY_CARE_PROVIDER_SITE_OTHER): Payer: Medicare HMO | Admitting: Cardiology

## 2019-11-19 ENCOUNTER — Encounter: Payer: Self-pay | Admitting: Cardiology

## 2019-11-19 VITALS — BP 120/79 | HR 72 | Ht 70.0 in | Wt 225.0 lb

## 2019-11-19 DIAGNOSIS — I4891 Unspecified atrial fibrillation: Secondary | ICD-10-CM

## 2019-11-19 DIAGNOSIS — Z950 Presence of cardiac pacemaker: Secondary | ICD-10-CM

## 2019-11-19 DIAGNOSIS — I455 Other specified heart block: Secondary | ICD-10-CM

## 2019-11-19 DIAGNOSIS — I10 Essential (primary) hypertension: Secondary | ICD-10-CM | POA: Diagnosis not present

## 2019-11-19 MED ORDER — DILTIAZEM HCL ER COATED BEADS 300 MG PO CP24
300.0000 mg | ORAL_CAPSULE | Freq: Every day | ORAL | 1 refills | Status: DC
Start: 2019-11-19 — End: 2020-05-01

## 2019-11-19 NOTE — Addendum Note (Signed)
Addended by: Julian Hy T on: 11/19/2019 10:41 AM   Modules accepted: Orders

## 2019-11-19 NOTE — Patient Instructions (Signed)
Your physician wants you to follow-up in: [place] has recommended you make the following change in your medication:   INCREASE DILTIAZEM 300 MG DAILY   Thank you for choosing [facility]!!

## 2019-11-19 NOTE — Progress Notes (Signed)
Virtual Visit via Telephone Note   This visit type was conducted due to national recommendations for restrictions regarding the COVID-19 Pandemic (e.g. social distancing) in an effort to limit this patient's exposure and mitigate transmission in our community.  Due to his co-morbid illnesses, this patient is at least at moderate risk for complications without adequate follow up.  This format is felt to be most appropriate for this patient at this time.  The patient did not have access to video technology/had technical difficulties with video requiring transitioning to audio format only (telephone).  All issues noted in this document were discussed and addressed.  No physical exam could be performed with this format.  Please refer to the patient's chart for his  consent to telehealth for [facility].    Date:  11/19/2019   ID:  Ralph [name], DOB 1951-01-31, MRN [id number] The patient was identified using 2 identifiers.  Patient Location: Home Provider Location: Office/Clinic  PCP:  Sharilyn Sites, MD  Cardiologist:  Carlyle Dolly, MD  Electrophysiologist:  Cristopher Peru, MD   Evaluation Performed:  Follow-Up Visit  Chief Complaint:  Follow up  History of Present Illness:    Ralph [name] is a 69 y.o. male seen today for follow up of the following medical problems.  1. Bradycardia/sinus arrest  - St Jude dual chamber pacemaker pacemaker implanted Sept 2013 ([place] DR RF).   09/2019 pacemaker check normal function - no recent symptoms  2. Afib/aflutter - previous side effects on multaq - seen by EP 06/28/13, started on flecanide. Continued to have symptoms. Had RF ablation of flutter by Dr Lovena Le 07/10/13. 10/2014 device check did show some AF burden - on nadolol for history of palpitations as well as esoph varices   -some palpitations, about every 2 days. Lasts about 15 minutes.   - changed from nadolol to propranolol due to insurance.  - no bleeding on  xarelto.   3. HTN  - compliant with meds  4. HL  - labs followed by pcp  - 01/2019 TC 152 TG 226 HDL 42 LDL 73 - compliant with statin, fish oil  5. NASH cirrhosis  - followed by GI  - history of grade I varices, no history of bleeding on anticoag    6. COPD  - compliant with inhalers and home oxygen. - followed by pulmonary  7. Orthostatic dizziness - has not had recent issues  8. DM2 - followed Dr Dorris Fetch   [facility]: completed covid vaccine.   The patient does not have symptoms concerning for COVID-19 infection (fever, chills, cough, or new shortness of breath).    Past Medical History:  Diagnosis Date  . Anginal pain ([place])   . Arthritis    "back; fingers" (01/06/2012)  . Atrial flutter [facility])    s/p EPS +RF ablation of typical atrial flutter April 2015  . Cancer ([place])    skin cancer  . CHB (complete heart block) ([place])   . CHF (congestive heart failure) ([place]) 01/06/2012  . Chronic lower back pain   . Cirrhosis ([place])    NASH-Hep A and B immune  . Coughing up blood    "comes from my throat" (01/06/2012)  . DDD (degenerative disc disease), lumbar   . Depressed   . Difficult intubation    Eschmann stylet used in 2002 and 2007; "trouble waking up afterwards" (01/06/2012)  . Emphysema   . Fatty liver disease, nonalcoholic   . GERD (gastroesophageal reflux disease)   .  H/O hiatal hernia   . Headache   . History of esophageal varices   . Hypertension   . Hypothyroidism   . Orthostatic dizziness   . Pacemaker   . Pneumonia Aug 2016  . Presence of permanent cardiac pacemaker 9/292013   St.Jude  . Sinus pause 01/06/2012   5.2 seconds  . Sleep apnea    "don't wear mask" (01/06/2012)  . Stroke [facility])    pt states that he might have had a stroke not sure  . Type II diabetes mellitus ([place])   . Varicose vein    of esophagus   Past Surgical History:  Procedure Laterality Date  . ATRIAL FLUTTER ABLATION N/A 07/10/2013   Procedure: ATRIAL FLUTTER ABLATION;   Surgeon: Evans Lance, MD;  Location: [facility] CATH LAB;  Service: Cardiovascular;  Laterality: N/A;  . BACK SURGERY    . CHOLECYSTECTOMY  1993  . COLONOSCOPY  11/08/2004   CHE:NIDPOE rectum, colon, TI.  Marland Kitchen COLONOSCOPY N/A 05/28/2014   Dr. Gala Romney: Redundant colon. single colonic polyp removed as described above. Tubular adenoma  . ESOPHAGEAL DILATION N/A 05/28/2014   Procedure: ESOPHAGEAL DILATION;  Surgeon: Daneil Dolin, MD;  [facility];  Service: Endoscopy;  Laterality: N/A;  . ESOPHAGOGASTRODUODENOSCOPY  11/08/2004   UMP:NTIRWE esophageal erosions consistent with erosive reflux esophagitis/Areas of hemorrhage and nodularity of the fundal mucosa of uncertain significance, biopsied.  Small hiatal hernia, otherwise normal stomach  . ESOPHAGOGASTRODUODENOSCOPY  2010   Dr. Gala Romney: 3 columns Grade 1 varices, erosive esophagitis, HH, portal gastropathy, normal D1, D2  . ESOPHAGOGASTRODUODENOSCOPY N/A 05/28/2014   Dr. Gala Romney: MIld erosive reflux esophagitis. Grade 1 esophageal varices. Patent esophagus. No dilation performed. Hiatal hernia.   Marland Kitchen ESOPHAGOGASTRODUODENOSCOPY (EGD) WITH ESOPHAGEAL DILATION N/A 02/14/2013   RXV:QMGQQ 1 esophageal varices. Abnormal distal esophagus/status post biopsy after Maloney dilation. Portal gastropathy. Antral erosions-status post biopsy. path negative for H.pylori, benign path.  . LUMBAR [place]; ~ 1995; ~ 1996  . NASAL SEPTUM SURGERY  1992  . nuclear stress test  10/19/2004   No ischemia  . PERMANENT PACEMAKER INSERTION  01/08/2012   CHB  . PERMANENT PACEMAKER INSERTION N/A 01/09/2012   Procedure: PERMANENT PACEMAKER INSERTION;  Surgeon: Sanda Klein, MD;  Location: [place] CATH LAB;  Service: Cardiovascular;  Laterality: N/A;  . POSTERIOR FUSION LUMBAR SPINE  1999   L4-5  . SPINAL CORD STIMULATOR IMPLANT  2006  . SPINAL CORD STIMULATOR REMOVAL N/A 01/27/2015   Procedure: LUMBAR SPINAL CORD STIMULATOR REMOVAL;  Surgeon: Kristeen Miss, MD;  Location:  [place] NEURO ORS;  Service: Neurosurgery;  Laterality: N/A;  LUMBAR SPINAL CORD STIMULATOR REMOVAL  . TONSILLECTOMY AND ADENOIDECTOMY  1992  . US ECHOCARDIOGRAPHY  12/28/2011   mild LVH,mild mitral annulara ca+,mild MR  . Warthin's tumor excision  1990's   right     Current Meds  Medication Sig  . Blood Glucose Monitoring Suppl (ACCU-CHEK AVIVA) device by Other route. Use as instructed to check blood sugar 2 times daily  . dapagliflozin propanediol (FARXIGA) 10 MG TABS tablet Take 10 mg by mouth daily.  . diazepam (VALIUM) 10 MG tablet Take 1 tablet by mouth daily as needed.  . diltiazem (CARDIZEM CD) 240 MG 24 hr capsule Take 1 capsule (240 mg total) by mouth daily.  Marland Kitchen escitalopram (LEXAPRO) 10 MG tablet Take 1 tablet by mouth daily.  . flecainide (TAMBOCOR) 100 MG tablet TAKE 1 TABLET BY MOUTH TWICE A DAY  . furosemide (LASIX) 20 MG  tablet TAKE 2 TABLETS BY MOUTH DAILY AS NEEDED FOR SWELLING  . Insulin Glargine-Lixisenatide (SOLIQUA) 100-33 UNT-MCG/ML SOPN Inject 45 Units into the skin daily with breakfast.  . Insulin Pen Needle (NOVOFINE) 32G X 6 MM MISC TEST DAILY AS DIRECTED  . levothyroxine (SYNTHROID, LEVOTHROID) 112 MCG tablet TAKE 1 TABLET BY MOUTH EVERY MORNING BEFORE BREAKFAST (Patient taking differently: Take 112 mcg by mouth every morning. )  . metFORMIN (GLUCOPHAGE) 1000 MG tablet Take 1 tablet (1,000 mg total) by mouth 2 (two) times daily with a meal.  . nadolol (CORGARD) 40 MG tablet Take 40 mg by mouth 2 (two) times daily.  . Omega-3 Fatty Acids (FISH OIL) 1200 MG CAPS Take by mouth 2 (two) times daily.  . pantoprazole (PROTONIX) 40 MG tablet Take 1 tablet (40 mg total) by mouth daily. (Patient taking differently: Take 40 mg by mouth daily as needed. )  . propranolol (INDERAL) 20 MG tablet TAKE 1 TABLET BY MOUTH TWICE A DAY  . rivaroxaban (XARELTO) 20 MG TABS tablet TAKE 1 TABLET BY MOUTH EVERY DAY WITH DINNER  . simvastatin (ZOCOR) 20 MG tablet TAKE 1 TABLET BY MOUTH EVERY DAY  IN THE EVENING  . spironolactone (ALDACTONE) 50 MG tablet Take 1 tablet (50 mg total) by mouth 2 (two) times daily.     Allergies:   Nitroglycerin   Social History   Tobacco Use  . Smoking status: Former Smoker    Packs/day: 0.25    Years: 45.00    Pack years: 11.25    Types: Cigarettes    Start date: 04/11/1968  . Smokeless tobacco: Never Used  . Tobacco comment: Quit x 8 months this time  Vaping Use  . Vaping Use: Never used  Substance Use Topics  . Alcohol use: No    Alcohol/week: 0.0 standard drinks    Comment: "quit alcohol 2011" Previously drinking socially about twice per month  . Drug use: No     Family Hx: The patient's family history includes Arrhythmia in his father; Cancer in his mother; Crohn's disease in his daughter; Diabetes in his maternal grandmother; Other in his father; Ovarian cancer in his mother; Stroke in his brother, brother, and sister. There is no history of Colon cancer.  ROS:   Please see the history of present illness.     All other systems reviewed and are negative.   Prior CV studies:   The following studies were reviewed today:   Labs/Other Tests and Data Reviewed:    EKG:  No ECG reviewed.  Recent Labs: No results found for requested labs within last 8760 hours.   Recent Lipid Panel Lab Results  Component Value Date/Time   CHOL 111 (L) 12/11/2015 07:53 AM   TRIG 276 (H) 12/11/2015 07:53 AM   HDL 30 (L) 12/11/2015 07:53 AM   CHOLHDL 3.7 12/11/2015 07:53 AM   LDLCALC 26 12/11/2015 07:53 AM    Wt Readings from Last 3 Encounters:  11/19/19 225 lb (102.1 kg)  08/27/19 229 lb 9.6 oz (104.1 kg)  06/20/19 229 lb (103.9 kg)     Objective:    Vital Signs:  BP 120/79   Pulse 72   Ht 5\' 10"  (1.778 m)   Wt 225 lb (102.1 kg)   BMI 32.28 kg/m    Normal affect. Normal speech pattern and tone. Comfortable, no apparent distress. No audible signs of sob or wheezing.   ASSESSMENT & PLAN:    1. Afib/Aflutter - some recent issues  with palpitations, increase  dilt to 300mg  daily   2. HTN:  - he is at goal, continue current meds  3. HL  -  Reports PCP is watching his LFTs closely due to an increase and history of NASH, will defer management to PCP    4. Sinus arrest/Pacemaker -recent normal device check, no symptoms - continue to monitor  COVID-19 Education: The signs and symptoms of COVID-19 were discussed with the patient and how to seek care for testing (follow up with PCP or arrange E-visit).  The importance of social distancing was discussed today.  Time:   Today, I have spent 16 minutes with the patient with telehealth technology discussing the above problems.     Medication Adjustments/Labs and Tests Ordered: Current medicines are reviewed at length with the patient today.  Concerns regarding medicines are outlined above.   Tests Ordered: No orders of the defined types were placed in this encounter.   Medication Changes: No orders of the defined types were placed in this encounter.   Follow Up:  In Person in 6 month(s)  Signed, Carlyle Dolly, MD  11/19/2019 10:27 AM

## 2019-12-10 ENCOUNTER — Ambulatory Visit (INDEPENDENT_AMBULATORY_CARE_PROVIDER_SITE_OTHER): Payer: Medicare HMO | Admitting: *Deleted

## 2019-12-10 DIAGNOSIS — I455 Other specified heart block: Secondary | ICD-10-CM

## 2019-12-10 LAB — CUP PACEART REMOTE DEVICE CHECK
Battery Remaining Longevity: 37 mo
Battery Remaining Percentage: 35 %
Battery Voltage: 2.83 V
Brady Statistic AP VP Percent: 1 %
Brady Statistic AP VS Percent: 66 %
Brady Statistic AS VP Percent: 1 %
Brady Statistic AS VS Percent: 33 %
Brady Statistic RA Percent Paced: 65 %
Brady Statistic RV Percent Paced: 1 %
Date Time Interrogation Session: 20210831133538
Implantable Lead Implant Date: 20130920
Implantable Lead Implant Date: 20130920
Implantable Lead Location: 753859
Implantable Lead Location: 753860
Implantable Pulse Generator Implant Date: 20130920
Lead Channel Impedance Value: 310 Ohm
Lead Channel Impedance Value: 410 Ohm
Lead Channel Pacing Threshold Amplitude: 0.5 V
Lead Channel Pacing Threshold Amplitude: 0.75 V
Lead Channel Pacing Threshold Pulse Width: 0.4 ms
Lead Channel Pacing Threshold Pulse Width: 1 ms
Lead Channel Sensing Intrinsic Amplitude: 1.2 mV
Lead Channel Sensing Intrinsic Amplitude: 4.2 mV
Lead Channel Setting Pacing Amplitude: 2 V
Lead Channel Setting Pacing Amplitude: 2.5 V
Lead Channel Setting Pacing Pulse Width: 1 ms
Lead Channel Setting Sensing Sensitivity: 1 mV
Pulse Gen Model: 2210
Pulse Gen Serial Number: 7393982

## 2019-12-11 NOTE — Progress Notes (Signed)
Remote pacemaker transmission.   

## 2019-12-13 DIAGNOSIS — Z0000 Encounter for general adult medical examination without abnormal findings: Secondary | ICD-10-CM | POA: Diagnosis not present

## 2019-12-13 DIAGNOSIS — E6609 Other obesity due to excess calories: Secondary | ICD-10-CM | POA: Diagnosis not present

## 2019-12-13 DIAGNOSIS — E782 Mixed hyperlipidemia: Secondary | ICD-10-CM | POA: Diagnosis not present

## 2019-12-13 DIAGNOSIS — Z125 Encounter for screening for malignant neoplasm of prostate: Secondary | ICD-10-CM | POA: Diagnosis not present

## 2019-12-13 DIAGNOSIS — Z1389 Encounter for screening for other disorder: Secondary | ICD-10-CM | POA: Diagnosis not present

## 2019-12-13 DIAGNOSIS — Z6834 Body mass index (BMI) 34.0-34.9, adult: Secondary | ICD-10-CM | POA: Diagnosis not present

## 2019-12-30 ENCOUNTER — Ambulatory Visit: Payer: Medicare HMO | Admitting: Internal Medicine

## 2019-12-30 ENCOUNTER — Other Ambulatory Visit: Payer: Self-pay

## 2019-12-30 ENCOUNTER — Encounter: Payer: Self-pay | Admitting: Internal Medicine

## 2019-12-30 VITALS — BP 142/68 | HR 82 | Ht 70.0 in | Wt 230.6 lb

## 2019-12-30 DIAGNOSIS — E1142 Type 2 diabetes mellitus with diabetic polyneuropathy: Secondary | ICD-10-CM | POA: Diagnosis not present

## 2019-12-30 DIAGNOSIS — E1165 Type 2 diabetes mellitus with hyperglycemia: Secondary | ICD-10-CM

## 2019-12-30 DIAGNOSIS — Z794 Long term (current) use of insulin: Secondary | ICD-10-CM

## 2019-12-30 LAB — POCT GLYCOSYLATED HEMOGLOBIN (HGB A1C): Hemoglobin A1C: 8 % — AB (ref 4.0–5.6)

## 2019-12-30 MED ORDER — PEN NEEDLES 32G X 4 MM MISC: 1.0000 | Freq: Two times a day (BID) | 11 refills | Status: DC

## 2019-12-30 MED ORDER — NOVOLOG MIX 70/30 FLEXPEN (70-30) 100 UNIT/ML ~~LOC~~ SUPN: 20.0000 [IU] | PEN_INJECTOR | Freq: Two times a day (BID) | SUBCUTANEOUS | 11 refills | Status: DC

## 2019-12-30 NOTE — Progress Notes (Signed)
Name: Ralph [name]  Age/ Sex: 70 y.o., male   MRN/ DOB: [id number], 1950-08-27     PCP: Sharilyn Sites, MD   Reason for Endocrinology Evaluation: Type 2 Diabetes Mellitus  Initial Endocrine Consultative Visit: 04/18/2018    PATIENT IDENTIFIER: [name] is a 69 y.o. male with a past medical history of A.Fib (S/P Pacemaker), CHF, HTN, Hypothyroidism and T2DM. The patient has followed with Endocrinology clinic since 04/18/2017 for consultative assistance with management of his diabetes.  DIABETIC HISTORY:  Ralph [name] was diagnosed with T2DM in ~ 2009, he has been on oral glycemic agents for years, initially was only on metformin followed by farxiga and levemir added ~ 6 yrs ago but was unable to afford it. His hemoglobin A1c has ranged from 5.7% in 12/2015, peaking at 7.5% in 06/2015.   SUBJECTIVE:   During the last visit (08/27/2019): Ralph [name] is here for 4 month  follow-up appointment on diabetes management He checks his blood sugars 2 times daily, fasting and bedtime. The patient has not had hypoglycemic episodes since the last clinic visit. Otherwise, the patient has not required any recent emergency interventions for hypoglycemia and has not had recent hospitalizations secondary to hyper or hypoglycemic episodes.   Today (12/30/2019): 4 month F/U for Type 2 Diabetes Mellitus  Has chronic nausea.  Denies constipation and diarrhea.  Pt with chronic dysphagia  Denies polyuria, polydipsia, and polyphagia     HOME DIABETES REGIMEN:  - Metformin 1000 mg Twice a day with meals  - Farxiga 10 mg once a day  - Soliqua 45 units daily     METER DOWNLOAD SUMMARY: Date range evaluated: 9/7-9/20/2021 Overall Mean FS Glucose = 183 Glucose Checks per Day = 1.7  BG Ranges: Low = 141 High = 270   Hypoglycemic Events/30 Days: BG < 50 = 0 Episodes of symptomatic severe hypoglycemia = 0  DIABETIC COMPLICATIONS: Microvascular complications:  Neuropathy Denies: retinopathy   Last eye exam: Completed 04/2018  Macrovascular complications:  CHF  Denies: CAD, PVD, CVA  HISTORY:  Past Medical History:  Past Medical History:  Diagnosis Date  . Anginal pain ([place])   . Arthritis    "back; fingers" (01/06/2012)  . Atrial flutter [place] [facility] [place])    s/p EPS +RF ablation of typical atrial flutter April 2015  . Cancer ([place])    skin cancer  . CHB (complete heart block) ([place])   . CHF (congestive heart failure) ([place]) 01/06/2012  . Chronic lower back pain   . Cirrhosis ([place])    NASH-Hep A and B immune  . Coughing up blood    "comes from my throat" (01/06/2012)  . DDD (degenerative disc disease), lumbar   . Depressed   . Difficult intubation    Eschmann stylet used in 2002 and 2007; "trouble waking up afterwards" (01/06/2012)  . Emphysema   . Fatty liver disease, nonalcoholic   . GERD (gastroesophageal reflux disease)   . H/O hiatal hernia   . Headache   . History of esophageal varices   . Hypertension   . Hypothyroidism   . Orthostatic dizziness   . Pacemaker   . Pneumonia Aug 2016  . Presence of permanent cardiac pacemaker 9/292013   St.Jude  . Sinus pause 01/06/2012   5.2 seconds  . Sleep apnea    "don't wear mask" (01/06/2012)  . Stroke [facility])    pt states that he might have had a stroke not sure  . Type II diabetes mellitus ([place])   .  Varicose vein    of esophagus   Past Surgical History:  Past Surgical History:  Procedure Laterality Date  . ATRIAL FLUTTER ABLATION N/A 07/10/2013   Procedure: ATRIAL FLUTTER ABLATION;  Surgeon: Evans Lance, MD;  Location: [facility] CATH LAB;  Service: Cardiovascular;  Laterality: N/A;  . BACK SURGERY    . CHOLECYSTECTOMY  1993  . COLONOSCOPY  11/08/2004   EYC:XKGYJE rectum, colon, TI.  Marland Kitchen COLONOSCOPY N/A 05/28/2014   Dr. Gala Romney: Redundant colon. single colonic polyp removed as described above. Tubular adenoma  . ESOPHAGEAL DILATION N/A 05/28/2014   Procedure: ESOPHAGEAL DILATION;  Surgeon: Daneil Dolin, MD;  [facility] ENDO  [facility];  Service: Endoscopy;  Laterality: N/A;  . ESOPHAGOGASTRODUODENOSCOPY  11/08/2004   HUD:JSHFWY esophageal erosions consistent with erosive reflux esophagitis/Areas of hemorrhage and nodularity of the fundal mucosa of uncertain significance, biopsied.  Small hiatal hernia, otherwise normal stomach  . ESOPHAGOGASTRODUODENOSCOPY  2010   Dr. Gala Romney: 3 columns Grade 1 varices, erosive esophagitis, HH, portal gastropathy, normal D1, D2  . ESOPHAGOGASTRODUODENOSCOPY N/A 05/28/2014   Dr. Gala Romney: MIld erosive reflux esophagitis. Grade 1 esophageal varices. Patent esophagus. No dilation performed. Hiatal hernia.   Marland Kitchen ESOPHAGOGASTRODUODENOSCOPY (EGD) WITH ESOPHAGEAL DILATION N/A 02/14/2013   OVZ:CHYIF 1 esophageal varices. Abnormal distal esophagus/status post biopsy after Maloney dilation. Portal gastropathy. Antral erosions-status post biopsy. path negative for H.pylori, benign path.  . LUMBAR [place]; ~ 1995; ~ 1996  . NASAL SEPTUM SURGERY  1992  . nuclear stress test  10/19/2004   No ischemia  . PERMANENT PACEMAKER INSERTION  01/08/2012   CHB  . PERMANENT PACEMAKER INSERTION N/A 01/09/2012   Procedure: PERMANENT PACEMAKER INSERTION;  Surgeon: Sanda Klein, MD;  Location: [place] CATH LAB;  Service: Cardiovascular;  Laterality: N/A;  . POSTERIOR FUSION LUMBAR SPINE  1999   L4-5  . SPINAL CORD STIMULATOR IMPLANT  2006  . SPINAL CORD STIMULATOR REMOVAL N/A 01/27/2015   Procedure: LUMBAR SPINAL CORD STIMULATOR REMOVAL;  Surgeon: Kristeen Miss, MD;  Location: [place] NEURO ORS;  Service: Neurosurgery;  Laterality: N/A;  LUMBAR SPINAL CORD STIMULATOR REMOVAL  . TONSILLECTOMY AND ADENOIDECTOMY  1992  . US ECHOCARDIOGRAPHY  12/28/2011   mild LVH,mild mitral annulara ca+,mild MR  . 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 His smoking use included cigarettes. He started smoking about 51 years ago. He has a 11.25 pack-year smoking history. He has never  used smokeless tobacco. He reports that he does not drink alcohol and does not use drugs. Family History:  Family History  Problem Relation Age of Onset  . Cancer Mother        Deceased, 69  . Ovarian cancer Mother   . Arrhythmia Father   . Other Father        Deceased 11  . Stroke Brother   . Stroke Brother   . Stroke Sister   . Crohn's disease Daughter   . Diabetes Maternal Grandmother   . Colon cancer Neg Hx      HOME MEDICATIONS: Allergies as of 12/30/2019      Reactions   Nitroglycerin Hives, Swelling, Rash      Medication List       Accurate as of December 30, 2019  7:34 AM. If you have any questions, ask your nurse or doctor.        Accu-Chek Aviva device by Other route. Use as instructed to check blood sugar 2  times daily   dapagliflozin propanediol 10 MG Tabs tablet Commonly known as: Farxiga Take 10 mg by mouth daily.   diazepam 10 MG tablet Commonly known as: VALIUM Take 1 tablet by mouth daily as needed.   diltiazem 300 MG 24 hr capsule Commonly known as: Cardizem CD Take 1 capsule (300 mg total) by mouth daily.   escitalopram 10 MG tablet Commonly known as: LEXAPRO Take 1 tablet by mouth daily.   Fish Oil 1200 MG Caps Take by mouth 2 (two) times daily.   flecainide 100 MG tablet Commonly known as: TAMBOCOR TAKE 1 TABLET BY MOUTH TWICE A DAY   furosemide 20 MG tablet Commonly known as: LASIX TAKE 2 TABLETS BY MOUTH DAILY AS NEEDED FOR SWELLING   levothyroxine 112 MCG tablet Commonly known as: SYNTHROID TAKE 1 TABLET BY MOUTH EVERY MORNING BEFORE BREAKFAST What changed: See the new instructions.   metFORMIN 1000 MG tablet Commonly known as: GLUCOPHAGE Take 1 tablet (1,000 mg total) by mouth 2 (two) times daily with a meal.   NovoFine 32G X 6 MM Misc Generic drug: Insulin Pen Needle TEST DAILY AS DIRECTED   pantoprazole 40 MG tablet Commonly known as: PROTONIX Take 1 tablet (40 mg total) by mouth daily. What changed:  when to  take this reasons to take this   propranolol 20 MG tablet Commonly known as: INDERAL TAKE 1 TABLET BY MOUTH TWICE A DAY   rivaroxaban 20 MG Tabs tablet Commonly known as: Xarelto TAKE 1 TABLET BY MOUTH EVERY DAY WITH DINNER   simvastatin 20 MG tablet Commonly known as: ZOCOR TAKE 1 TABLET BY MOUTH EVERY DAY IN THE EVENING   Soliqua 100-33 UNT-MCG/ML Sopn Generic drug: Insulin Glargine-Lixisenatide Inject 45 Units into the skin daily with breakfast.   spironolactone 50 MG tablet Commonly known as: ALDACTONE Take 1 tablet (50 mg total) by mouth 2 (two) times daily.        OBJECTIVE:   Vital Signs: There were no vitals taken for this visit.  Wt Readings from Last 3 Encounters:  11/19/19 225 lb (102.1 kg)  08/27/19 229 lb 9.6 oz (104.1 kg)  06/20/19 229 lb (103.9 kg)   Exam: General: Pt appears well and is in NAD  Lungs: Clear with good BS bilat with no rales, rhonchi, or wheezes  Heart: RRR with normal S1 and S2 and no gallops; no murmurs; no rub  Abdomen: Normoactive bowel sounds, soft, nontender, without masses or organomegaly palpable  Extremities: Trace pretibial edema.   Neuro: MS is good with appropriate affect, pt is alert and Ox3   DM foot exam: 04/29/2019   The skin of the feet is intact without sores or ulcerations. Multiple plantar callous formation noted The pedal pulses are 2+ on right and 2+ on left. The sensation is decreased to a screening 5.07, 10 gram monofilament bilaterally    DATA REVIEWED:  Results for Ralph [name], Ralph [name] (MRN [id number]) as of 12/31/2019 12:49  Ref. Range 06/28/2018 09:43  Sodium Latest Ref Range: 135 - 145 mEq/L 137  Potassium Latest Ref Range: 3.5 - 5.1 mEq/L 4.5  Chloride Latest Ref Range: 96 - 112 mEq/L 99  CO2 Latest Ref Range: 19 - 32 mEq/L 30  Glucose Latest Ref Range: 70 - 99 mg/dL 153 (H)  BUN Latest Ref Range: 6 - 23 mg/dL 21  Creatinine Latest Ref Range: 0.40 - 1.50 mg/dL 1.00  Calcium Latest Ref Range: 8.4 -  10.5 mg/dL 9.5  GFR Latest Ref Range: >60.00  mL/min 74.88     ASSESSMENT / PLAN / RECOMMENDATIONS:   1)Type 2 Diabetes Mellitus, poorly controlled With Neuropathic  complications - Most recent A1c of  8.0%. Goal A1c <7.0 %.    -Despite gradual increase in Soliqua dose, he continues with hyperglycemia and his A1c continues to be above goal. -We are going to switch his Soliqua to NovoLog Mix, we could consider a GLP-1 agonist in the future -In the meantime we will continue Metformin and Farxiga -Patient continues with variable CHO intake hence the variable BG levels at night.    MEDICATIONS: - STOP Soliqua - Start Novolog Mix 20 units with Breakfast and Supper  - Continue Metformin 1000 mg twice a day  - Continue Farxiga 10 mg daily     EDUCATION / INSTRUCTIONS: BG monitoring instructions: Patient is instructed to check his blood sugars 1 times a day, fasting  Call [facility] if: BG persistently < 70 or > 300. I reviewed the Rule of 15 for the treatment of hypoglycemia in detail with the patient. Literature supplied.    Follow-up in 4 months  Signed electronically by: Mack Guise, MD  [facility]  [facility] [address] [place], [place] [address] Phone: [phone] FAX: [phone]   CC: Sharilyn Sites, [place] [place] [address] Phone: [phone]  Fax: [phone]  Return to Endocrinology clinic as below: Future Appointments  Date Time Provider [place]  12/30/2019  8:10 AM Shamleffer, Melanie Crazier, MD [facility] None  03/10/2020  7:50 AM [facility] DEVICE REMOTES [facility]  05/29/2020 10:20 AM Arnoldo Lenis, MD [facility]  06/09/2020  7:50 AM [facility] DEVICE REMOTES [facility]  09/08/2020  7:50 AM [facility] DEVICE REMOTES [facility]  12/08/2020  7:50 AM [facility] DEVICE REMOTES [facility]  03/09/2021  7:50 AM [facility] DEVICE REMOTES [facility]  06/08/2021  7:50 AM [facility] DEVICE REMOTES [facility]  09/07/2021  7:50 AM [facility] DEVICE REMOTES [facility]

## 2019-12-30 NOTE — Patient Instructions (Addendum)
-   STOP Soliqua - Start Novolog Mix 20 units with Breakfast and Supper  - Continue Metformin 1000 mg twice a day  - Continue Farxiga 10 mg daily       -HOW TO TREAT LOW BLOOD SUGARS (Blood sugar LESS THAN 70 MG/DL)  Please follow the RULE OF 15 for the treatment of hypoglycemia treatment (when your (blood sugars are less than 70 mg/dL)    STEP 1: Take 15 grams of carbohydrates when your blood sugar is low, which includes:   3-4 GLUCOSE TABS  OR  3-4 OZ OF JUICE OR REGULAR SODA OR  ONE TUBE OF GLUCOSE GEL     STEP 2: RECHECK blood sugar in 15 MINUTES STEP 3: If your blood sugar is still low at the 15 minute recheck --> then, go back to STEP 1 and treat AGAIN with another 15 grams of carbohydrates.

## 2020-01-13 ENCOUNTER — Other Ambulatory Visit: Payer: Self-pay | Admitting: Internal Medicine

## 2020-02-08 ENCOUNTER — Other Ambulatory Visit: Payer: Self-pay | Admitting: Nurse Practitioner

## 2020-02-08 DIAGNOSIS — K7460 Unspecified cirrhosis of liver: Secondary | ICD-10-CM

## 2020-02-08 DIAGNOSIS — K219 Gastro-esophageal reflux disease without esophagitis: Secondary | ICD-10-CM

## 2020-02-10 ENCOUNTER — Encounter: Payer: Self-pay | Admitting: Internal Medicine

## 2020-02-29 ENCOUNTER — Other Ambulatory Visit: Payer: Self-pay | Admitting: Internal Medicine

## 2020-03-10 ENCOUNTER — Ambulatory Visit (INDEPENDENT_AMBULATORY_CARE_PROVIDER_SITE_OTHER): Payer: Medicare HMO

## 2020-03-10 DIAGNOSIS — I495 Sick sinus syndrome: Secondary | ICD-10-CM

## 2020-03-10 LAB — CUP PACEART REMOTE DEVICE CHECK
Battery Remaining Longevity: 37 mo
Battery Remaining Percentage: 35 %
Battery Voltage: 2.83 V
Brady Statistic AP VP Percent: 1 %
Brady Statistic AP VS Percent: 64 %
Brady Statistic AS VP Percent: 1 %
Brady Statistic AS VS Percent: 36 %
Brady Statistic RA Percent Paced: 63 %
Brady Statistic RV Percent Paced: 1 %
Date Time Interrogation Session: 20211130072319
Implantable Lead Implant Date: 20130920
Implantable Lead Implant Date: 20130920
Implantable Lead Location: 753859
Implantable Lead Location: 753860
Implantable Pulse Generator Implant Date: 20130920
Lead Channel Impedance Value: 310 Ohm
Lead Channel Impedance Value: 430 Ohm
Lead Channel Pacing Threshold Amplitude: 0.5 V
Lead Channel Pacing Threshold Amplitude: 0.75 V
Lead Channel Pacing Threshold Pulse Width: 0.4 ms
Lead Channel Pacing Threshold Pulse Width: 1 ms
Lead Channel Sensing Intrinsic Amplitude: 1.2 mV
Lead Channel Sensing Intrinsic Amplitude: 4.6 mV
Lead Channel Setting Pacing Amplitude: 2 V
Lead Channel Setting Pacing Amplitude: 2.5 V
Lead Channel Setting Pacing Pulse Width: 1 ms
Lead Channel Setting Sensing Sensitivity: 1 mV
Pulse Gen Model: 2210
Pulse Gen Serial Number: 7393982

## 2020-03-17 NOTE — Progress Notes (Signed)
Remote pacemaker transmission.   

## 2020-03-27 DIAGNOSIS — Z0100 Encounter for examination of eyes and vision without abnormal findings: Secondary | ICD-10-CM | POA: Diagnosis not present

## 2020-04-29 ENCOUNTER — Other Ambulatory Visit: Payer: Self-pay

## 2020-04-29 NOTE — Progress Notes (Signed)
Name: Ralph [name]  Age/ Sex: 70 y.o., male   MRN/ DOB: [id number], June 26, 1950     PCP: Sharilyn Sites, MD   Reason for Endocrinology Evaluation: Type 2 Diabetes Mellitus  Initial Endocrine Consultative Visit: 04/18/2018    PATIENT IDENTIFIER: Ralph [name] is a 70 y.o. male with a past medical history of A.Fib (S/P Pacemaker), CHF, HTN, Hypothyroidism and T2DM. The patient has followed with Endocrinology clinic since 04/18/2017 for consultative assistance with management of his diabetes.  DIABETIC HISTORY:  Ralph [name] was diagnosed with T2DM in ~ 2009, he has been on oral glycemic agents for years, initially was only on metformin followed by farxiga and levemir added ~ 6 yrs ago but was unable to afford it. His hemoglobin A1c has ranged from 5.7% in 12/2015, peaking at 7.5% in 06/2015.  Soliqua changed to Insulin mix 12/2019, pt noted improvement in chronic nausea with discontinuation of Soliqua    SUBJECTIVE:   During the last visit (12/30/2019): A1c 8.0 %. We stopped soliqua , continued metformin and Wilder Glade  And started [employer]    Today (04/30/2020): Ralph [name] is here for 4 month  follow-up appointment on diabetes management He checks his blood sugars 2 times daily, fasting and bedtime. The patient has not had hypoglycemic episodes since the last clinic visit.   Nausea has improved since stopping the soliqua  Denies constipation and diarrhea.  Pt with chronic dysphagia     HOME DIABETES REGIMEN:  - Metformin 1000 mg Twice a day with meals  - Farxiga 10 mg once a day  - Novolog Mix 20 units BID     METER DOWNLOAD SUMMARY: Date range evaluated: 1/6-1/20/2022 Overall Mean FS Glucose = 169 Glucose Checks per Day = 1.9  BG Ranges: Low = 108 High = 244   Hypoglycemic Events/30 Days: BG < 50 =0 Episodes of symptomatic severe hypoglycemia = 0     DIABETIC COMPLICATIONS: Microvascular complications:  Neuropathy Denies: retinopathy  Last eye exam:  Completed 09/2019  Macrovascular complications:  CHF  Denies: CAD, PVD, CVA  HISTORY:  Past Medical History:  Past Medical History:  Diagnosis Date   Anginal pain ([place])    Arthritis    "back; fingers" (01/06/2012)   Atrial flutter ([place])    s/p EPS +RF ablation of typical atrial flutter April 2015   Cancer [facility])    skin cancer   CHB (complete heart block) (HCC)    CHF (congestive heart failure) ([place]) 01/06/2012   Chronic lower back pain    Cirrhosis (HCC)    NASH-Hep A and B immune   Coughing up blood    "comes from my throat" (01/06/2012)   DDD (degenerative disc disease), lumbar    Depressed    Difficult intubation    Eschmann stylet used in 2002 and 2007; "trouble waking up afterwards" (01/06/2012)   Emphysema    Fatty liver disease, nonalcoholic    GERD (gastroesophageal reflux disease)    H/O hiatal hernia    Headache    History of esophageal varices    Hypertension    Hypothyroidism    Orthostatic dizziness    Pacemaker    Pneumonia Aug 2016   Presence of permanent cardiac pacemaker 9/292013   St.Jude   Sinus pause 01/06/2012   5.2 seconds   Sleep apnea    "don't wear mask" (01/06/2012)   Stroke [facility])    pt states that he might have had a stroke not sure   Type II  diabetes mellitus ([place])    Varicose vein    of esophagus   Past Surgical History:  Past Surgical History:  Procedure Laterality Date   ATRIAL FLUTTER ABLATION N/A 07/10/2013   Procedure: ATRIAL FLUTTER ABLATION;  Surgeon: Evans Lance, MD;  Location: [facility] CATH LAB;  Service: Cardiovascular;  Laterality: N/A;   BACK SURGERY     CHOLECYSTECTOMY  1993   COLONOSCOPY  11/08/2004   [id number] rectum, colon, TI.   COLONOSCOPY N/A 05/28/2014   Dr. Gala Romney: Redundant colon. single colonic polyp removed as described above. Tubular adenoma   ESOPHAGEAL DILATION N/A 05/28/2014   Procedure: ESOPHAGEAL DILATION;  Surgeon: Daneil Dolin, MD;  [facility];  Service:  Endoscopy;  Laterality: N/A;   ESOPHAGOGASTRODUODENOSCOPY  11/08/2004   [id number] esophageal erosions consistent with erosive reflux esophagitis/Areas of hemorrhage and nodularity of the fundal mucosa of uncertain significance, biopsied.  Small hiatal hernia, otherwise normal stomach   ESOPHAGOGASTRODUODENOSCOPY  2010   Dr. Gala Romney: 3 columns Grade 1 varices, erosive esophagitis, HH, portal gastropathy, normal D1, D2   ESOPHAGOGASTRODUODENOSCOPY N/A 05/28/2014   Dr. Gala Romney: MIld erosive reflux esophagitis. Grade 1 esophageal varices. Patent esophagus. No dilation performed. Hiatal hernia.    ESOPHAGOGASTRODUODENOSCOPY (EGD) WITH ESOPHAGEAL DILATION N/A 02/14/2013   [id number] esophageal varices. Abnormal distal esophagus/status post biopsy after Maloney dilation. Portal gastropathy. Antral erosions-status post biopsy. path negative for H.pylori, benign path.   [place]; ~ 1995; ~ [place]   nuclear stress test  10/19/2004   No ischemia   PERMANENT PACEMAKER INSERTION  01/08/2012   [facility]   PERMANENT PACEMAKER INSERTION N/A 01/09/2012   Procedure: PERMANENT PACEMAKER INSERTION;  Surgeon: Sanda Klein, MD;  Location: [place] CATH LAB;  Service: Cardiovascular;  Laterality: N/A;   POSTERIOR FUSION LUMBAR SPINE  1999   L4-5   SPINAL CORD STIMULATOR IMPLANT  2006   SPINAL CORD STIMULATOR REMOVAL N/A 01/27/2015   Procedure: LUMBAR SPINAL CORD STIMULATOR REMOVAL;  Surgeon: Kristeen Miss, MD;  Location: [place] NEURO ORS;  Service: Neurosurgery;  Laterality: N/A;  LUMBAR SPINAL CORD STIMULATOR REMOVAL   TONSILLECTOMY AND ADENOIDECTOMY  1992   US ECHOCARDIOGRAPHY  12/28/2011   mild LVH,mild mitral annulara ca+,mild MR   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 His smoking use included cigarettes. He started smoking about 52 years ago. He has a 11.25 pack-year smoking history. He has never used smokeless  tobacco. He reports that he does not drink alcohol and does not use drugs. Family History:  Family History  Problem Relation Age of Onset   Cancer Mother        Deceased, 75   Ovarian cancer Mother    Arrhythmia Father    Other Father        Deceased 56   Stroke Brother    Stroke Brother    Stroke Sister    Crohn's disease Daughter    Diabetes Maternal Grandmother    Colon cancer Neg Hx      HOME MEDICATIONS: Allergies as of 04/30/2020      Reactions   Nitroglycerin Hives, Swelling, Rash      Medication List       Accurate as of April 30, 2020  7:56 AM. If you have any questions, ask your nurse or doctor.        Accu-Chek Aviva device by Other route. Use as  instructed to check blood sugar 2 times daily   Accu-Chek Guide test strip Generic drug: glucose blood TEST TWICE A DAY BEFORE MEALS   dapagliflozin propanediol 10 MG Tabs tablet Commonly known as: Farxiga Take 10 mg by mouth daily.   diazepam 10 MG tablet Commonly known as: VALIUM Take 1 tablet by mouth daily as needed.   diltiazem 300 MG 24 hr capsule Commonly known as: Cardizem CD Take 1 capsule (300 mg total) by mouth daily.   escitalopram 10 MG tablet Commonly known as: LEXAPRO Take 1 tablet by mouth daily.   Fish Oil 1200 MG Caps Take by mouth 2 (two) times daily.   flecainide 100 MG tablet Commonly known as: TAMBOCOR TAKE 1 TABLET BY MOUTH TWICE A DAY   furosemide 20 MG tablet Commonly known as: LASIX TAKE 2 TABLETS BY MOUTH DAILY AS NEEDED FOR SWELLING   levothyroxine 112 MCG tablet Commonly known as: SYNTHROID TAKE 1 TABLET BY MOUTH EVERY MORNING BEFORE BREAKFAST What changed: See the new instructions.   metFORMIN 1000 MG tablet Commonly known as: GLUCOPHAGE TAKE 1 TABLET BY MOUTH 2 TIMES DAILY WITH A MEAL.   NovoLOG Mix 70/30 FlexPen (70-30) 100 UNIT/ML FlexPen Generic drug: insulin aspart protamine - aspart Inject 0.2 mLs (20 Units total) into the skin 2 (two) times  daily.   pantoprazole 40 MG tablet Commonly known as: PROTONIX TAKE 1 TABLET BY MOUTH EVERY DAY   Pen Needles 32G X 4 MM Misc 1 Device by Does not apply route 2 (two) times daily.   propranolol 20 MG tablet Commonly known as: INDERAL TAKE 1 TABLET BY MOUTH TWICE A DAY   rivaroxaban 20 MG Tabs tablet Commonly known as: Xarelto TAKE 1 TABLET BY MOUTH EVERY DAY WITH DINNER   simvastatin 20 MG tablet Commonly known as: ZOCOR TAKE 1 TABLET BY MOUTH EVERY DAY IN THE EVENING   spironolactone 50 MG tablet Commonly known as: ALDACTONE Take 1 tablet (50 mg total) by mouth 2 (two) times daily.        OBJECTIVE:   Vital Signs: BP 140/72    Pulse 77    Ht 5\' 10"  (1.778 m)    Wt 227 lb 8 oz (103.2 kg)    SpO2 95%    BMI 32.64 kg/m   Wt Readings from Last 3 Encounters:  04/30/20 227 lb 8 oz (103.2 kg)  12/30/19 230 lb 9.6 oz (104.6 kg)  11/19/19 225 lb (102.1 kg)   Exam: General: Pt appears well and is in NAD  Lungs: Clear with good BS bilat with no rales, rhonchi, or wheezes  Heart: RRR with normal S1 and S2 and no gallops; no murmurs; no rub  Abdomen: Normoactive bowel sounds, soft, nontender, without masses or organomegaly palpable  Extremities: Trace pretibial edema.   Neuro: MS is good with appropriate affect, pt is alert and Ox3   DM foot exam: 04/30/2020    The skin of the feet is without sores or ulcerations. Multiple plantar callous formation noted The pedal pulses are 2+ on right and 2+ on left. The sensation is decreased to a screening 5.07, 10 gram monofilament bilaterally    DATA REVIEWED: 01/15/2019  BUN/Cr 22/0.850  ASSESSMENT / PLAN / RECOMMENDATIONS:   1)Type 2 Diabetes Mellitus, Sub-Optimally  controlled With Neuropathic  complications - Most recent A1c of 8.4 %. Goal A1c <7.0 %.    - Despite an improvement in his average glucose readings by at least 20 points, his A1c remains elevated A1c  -  We discussed CGM technology, pt interested ,a prescription  for  Dexcom  Has been faxed to DME supply company  - Will increase am insulin dose due to noted hyperglycemia at supper time   MEDICATIONS: Continue metformin 1000 mg twice a day with meals Continue Farxiga 10 mg once a day Novolog Mix 22 units with Breakfast and 20 units with Supper       EDUCATION / INSTRUCTIONS: BG monitoring instructions: Patient is instructed to check his blood sugars 2 times a day, fasting and bedtime. Call [place] [facility] clinic if: BG persistently < 70  I reviewed the Rule of 15 for the treatment of hypoglycemia in detail with the patient. Literature supplied.    F/U in 6 months    Signed electronically by: Mack Guise, MD  [facility] Endocrinology  [facility] [address] [place], [place] [address] Phone: [phone] FAX: [phone]   CC: Sharilyn Sites, [place] [address] Phone: [phone]  Fax: [phone]  Return to Endocrinology clinic as below: Future Appointments  Date Time Provider [place]  05/12/2020  9:00 AM Evans Lance, MD [facility]  05/29/2020 10:20 AM Arnoldo Lenis, MD [facility]  06/09/2020  7:50 AM [facility] DEVICE REMOTES [facility]  09/08/2020  7:50 AM [facility] DEVICE REMOTES [facility]  12/08/2020  7:50 AM [facility] DEVICE REMOTES [facility]  03/09/2021  7:50 AM [facility] DEVICE REMOTES [facility]  06/08/2021  7:50 AM [facility] DEVICE REMOTES [facility]  09/07/2021  7:50 AM [facility] DEVICE REMOTES [facility]

## 2020-04-30 ENCOUNTER — Encounter: Payer: Self-pay | Admitting: Internal Medicine

## 2020-04-30 ENCOUNTER — Ambulatory Visit: Payer: Medicare HMO | Admitting: Internal Medicine

## 2020-04-30 VITALS — BP 140/72 | HR 77 | Ht 70.0 in | Wt 227.5 lb

## 2020-04-30 DIAGNOSIS — I509 Heart failure, unspecified: Secondary | ICD-10-CM | POA: Diagnosis not present

## 2020-04-30 DIAGNOSIS — E1142 Type 2 diabetes mellitus with diabetic polyneuropathy: Secondary | ICD-10-CM

## 2020-04-30 DIAGNOSIS — J439 Emphysema, unspecified: Secondary | ICD-10-CM | POA: Diagnosis not present

## 2020-04-30 DIAGNOSIS — E1140 Type 2 diabetes mellitus with diabetic neuropathy, unspecified: Secondary | ICD-10-CM | POA: Diagnosis not present

## 2020-04-30 LAB — POCT GLYCOSYLATED HEMOGLOBIN (HGB A1C): Hemoglobin A1C: 8.4 % — AB (ref 4.0–5.6)

## 2020-04-30 MED ORDER — NOVOLOG MIX 70/30 FLEXPEN (70-30) 100 UNIT/ML ~~LOC~~ SUPN
PEN_INJECTOR | SUBCUTANEOUS | 11 refills | Status: DC
Start: 2020-04-30 — End: 2021-03-02

## 2020-04-30 MED ORDER — DEXCOM G6 SENSOR MISC: 1.0000 | 3 refills | Status: AC

## 2020-04-30 MED ORDER — DEXCOM G6 TRANSMITTER MISC: 1.0000 | 3 refills | Status: AC

## 2020-04-30 NOTE — Patient Instructions (Addendum)
-   Novolog Mix 22 units with Breakfast and 20 units Supper  - Continue Metformin 1000 mg twice a day  - Continue Farxiga 10 mg daily       -HOW TO TREAT LOW BLOOD SUGARS (Blood sugar LESS THAN 70 MG/DL)  Please follow the RULE OF 15 for the treatment of hypoglycemia treatment (when your (blood sugars are less than 70 mg/dL)    STEP 1: Take 15 grams of carbohydrates when your blood sugar is low, which includes:   3-4 GLUCOSE TABS  OR  3-4 OZ OF JUICE OR REGULAR SODA OR  ONE TUBE OF GLUCOSE GEL     STEP 2: RECHECK blood sugar in 15 MINUTES STEP 3: If your blood sugar is still low at the 15 minute recheck --> then, go back to STEP 1 and treat AGAIN with another 15 grams of carbohydrates.

## 2020-05-01 ENCOUNTER — Other Ambulatory Visit: Payer: Self-pay | Admitting: Cardiology

## 2020-05-12 ENCOUNTER — Other Ambulatory Visit: Payer: Self-pay

## 2020-05-12 ENCOUNTER — Encounter: Payer: Self-pay | Admitting: Internal Medicine

## 2020-05-12 ENCOUNTER — Ambulatory Visit: Payer: Medicare HMO | Admitting: Internal Medicine

## 2020-05-12 VITALS — BP 140/70 | HR 64 | Ht 70.0 in | Wt 227.4 lb

## 2020-05-12 DIAGNOSIS — I4891 Unspecified atrial fibrillation: Secondary | ICD-10-CM | POA: Diagnosis not present

## 2020-05-12 NOTE — Progress Notes (Signed)
HPI [name] returns today for ongoing follow-up of sinus node dysfunction and paroxysmal atrial fibrillation, status post permanent pacemaker insertion. He also has a history ofnon-obstructivecoronary artery disease and peripheral vascular disease. He denies chest pain, sob or syncope. He has some palpitations which are mild.  Allergies  Allergen Reactions  . Nitroglycerin Hives, Swelling and Rash     Current Outpatient Medications  Medication Sig Dispense Refill  . ACCU-CHEK GUIDE test strip TEST TWICE A DAY BEFORE MEALS 200 strip 6  . Blood Glucose Monitoring Suppl (ACCU-CHEK AVIVA) device by Other route. Use as instructed to check blood sugar 2 times daily    . Continuous Blood Gluc Sensor (DEXCOM G6 SENSOR) MISC 1 Device by Does not apply route as directed. 9 each 3  . Continuous Blood Gluc Transmit (DEXCOM G6 TRANSMITTER) MISC 1 Device by Does not apply route as directed. 1 each 3  . dapagliflozin propanediol (FARXIGA) 10 MG TABS tablet Take 10 mg by mouth daily. 90 tablet 0  . diazepam (VALIUM) 10 MG tablet Take 1 tablet by mouth daily as needed.    . diltiazem (CARDIZEM CD) 300 MG 24 hr capsule TAKE 1 CAPSULE BY MOUTH EVERY DAY 90 capsule 3  . escitalopram (LEXAPRO) 10 MG tablet Take 1 tablet by mouth daily.    . flecainide (TAMBOCOR) 100 MG tablet TAKE 1 TABLET BY MOUTH TWICE A DAY 180 tablet 3  . furosemide (LASIX) 20 MG tablet TAKE 2 TABLETS BY MOUTH DAILY AS NEEDED FOR SWELLING 180 tablet 3  . insulin aspart protamine - aspart (NOVOLOG MIX 70/30 FLEXPEN) (70-30) 100 UNIT/ML FlexPen Max daily 42 units 15 mL 11  . Insulin Pen Needle (PEN NEEDLES) 32G X 4 MM MISC 1 Device by Does not apply route 2 (two) times daily. 100 each 11  . levothyroxine (SYNTHROID, LEVOTHROID) 112 MCG tablet TAKE 1 TABLET BY MOUTH EVERY MORNING BEFORE BREAKFAST (Patient taking differently: Take 112 mcg by mouth every morning.) 30 tablet 0  . metFORMIN (GLUCOPHAGE) 1000 MG tablet TAKE 1 TABLET BY  MOUTH 2 TIMES DAILY WITH A MEAL. 180 tablet 1  . Omega-3 Fatty Acids (FISH OIL) 1200 MG CAPS Take by mouth 2 (two) times daily.    . pantoprazole (PROTONIX) 40 MG tablet TAKE 1 TABLET BY MOUTH EVERY DAY 90 tablet 3  . propranolol (INDERAL) 20 MG tablet TAKE 1 TABLET BY MOUTH TWICE A DAY 180 tablet 3  . rivaroxaban (XARELTO) 20 MG TABS tablet TAKE 1 TABLET BY MOUTH EVERY DAY WITH DINNER 90 tablet 0  . simvastatin (ZOCOR) 20 MG tablet TAKE 1 TABLET BY MOUTH EVERY DAY IN THE EVENING 90 tablet 3  . spironolactone (ALDACTONE) 50 MG tablet Take 1 tablet (50 mg total) by mouth 2 (two) times daily. 180 tablet 3   No current facility-administered medications for this visit.     Past Medical History:  Diagnosis Date  . Anginal pain ([place])   . Arthritis    "back; fingers" (01/06/2012)  . Atrial flutter [facility])    s/p EPS +RF ablation of typical atrial flutter April 2015  . Cancer ([place])    skin cancer  . CHB (complete heart block) ([place])   . CHF (congestive heart failure) ([place]) 01/06/2012  . Chronic lower back pain   . Cirrhosis ([place])    NASH-Hep A and B immune  . Coughing up blood    "comes from my throat" (01/06/2012)  . DDD (degenerative disc disease), lumbar   .  Depressed   . Difficult intubation    Eschmann stylet used in 2002 and 2007; "trouble waking up afterwards" (01/06/2012)  . Emphysema   . Fatty liver disease, nonalcoholic   . GERD (gastroesophageal reflux disease)   . H/O hiatal hernia   . Headache   . History of esophageal varices   . Hypertension   . Hypothyroidism   . Orthostatic dizziness   . Pacemaker   . Pneumonia Aug 2016  . Presence of permanent cardiac pacemaker 9/292013   St.Jude  . Sinus pause 01/06/2012   5.2 seconds  . Sleep apnea    "don't wear mask" (01/06/2012)  . Stroke [facility])    pt states that he might have had a stroke not sure  . Type II diabetes mellitus ([place])   . Varicose vein    of esophagus    ROS:   All systems reviewed and negative except as  noted in the HPI.   Past Surgical History:  Procedure Laterality Date  . ATRIAL FLUTTER ABLATION N/A 07/10/2013   Procedure: ATRIAL FLUTTER ABLATION;  Surgeon: Ralph Lance, MD;  [facility] CATH LAB;  Service: Cardiovascular;  Laterality: N/A;  . BACK SURGERY    . CHOLECYSTECTOMY  1993  . COLONOSCOPY  11/08/2004   BPZ:WCHENI rectum, colon, TI.  Marland Kitchen COLONOSCOPY N/A 05/28/2014   Dr. Gala [name]: Redundant colon. single colonic polyp removed as described above. Tubular adenoma  . ESOPHAGEAL DILATION N/A 05/28/2014   Procedure: ESOPHAGEAL DILATION;  Surgeon: Ralph Dolin, MD;  [facility];  Service: Endoscopy;  Laterality: N/A;  . ESOPHAGOGASTRODUODENOSCOPY  11/08/2004   DPO:EUMPNT esophageal erosions consistent with erosive reflux esophagitis/Areas of hemorrhage and nodularity of the fundal mucosa of uncertain significance, biopsied.  Small hiatal hernia, otherwise normal stomach  . ESOPHAGOGASTRODUODENOSCOPY  2010   Dr. Gala [name]: 3 columns Grade 1 varices, erosive esophagitis, HH, portal gastropathy, normal D1, D2  . ESOPHAGOGASTRODUODENOSCOPY N/A 05/28/2014   Dr. Gala [name]: MIld erosive reflux esophagitis. Grade 1 esophageal varices. Patent esophagus. No dilation performed. Hiatal hernia.   Marland Kitchen ESOPHAGOGASTRODUODENOSCOPY (EGD) WITH ESOPHAGEAL DILATION N/A 02/14/2013   IRW:ERXVQ 1 esophageal varices. Abnormal distal esophagus/status post biopsy after Maloney dilation. Portal gastropathy. Antral erosions-status post biopsy. path negative for H.pylori, benign path.  . LUMBAR [place]; ~ 1995; ~ 1996  . NASAL SEPTUM SURGERY  1992  . nuclear stress test  10/19/2004   No ischemia  . PERMANENT PACEMAKER INSERTION  01/08/2012   [facility]  . PERMANENT PACEMAKER INSERTION N/A 01/09/2012   Procedure: PERMANENT PACEMAKER INSERTION;  Surgeon: Ralph Klein, MD;  Location: [place] CATH LAB;  Service: Cardiovascular;  Laterality: N/A;  . POSTERIOR FUSION LUMBAR SPINE  1999   L4-5  . SPINAL CORD STIMULATOR  IMPLANT  2006  . SPINAL CORD STIMULATOR REMOVAL N/A 01/27/2015   Procedure: LUMBAR SPINAL CORD STIMULATOR REMOVAL;  Surgeon: Kristeen Miss, MD;  Location: [place] NEURO ORS;  Service: Neurosurgery;  Laterality: N/A;  LUMBAR SPINAL CORD STIMULATOR REMOVAL  . TONSILLECTOMY AND ADENOIDECTOMY  1992  . US ECHOCARDIOGRAPHY  12/28/2011   mild LVH,mild mitral annulara ca+,mild MR  . 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 Cancer Mother        Deceased, 65  . Ovarian cancer Mother   . Arrhythmia Father   . Other Father        Deceased 25  . Stroke Brother   .  Stroke Brother   . Stroke Sister   . Crohn's disease Daughter   . Diabetes Maternal Grandmother   . Colon cancer Neg Hx      Social History   Socioeconomic History  . Marital status: Married    Spouse name: Not on file  . Number of children: Not on file  . Years of education: Not on file  . Highest education level: Not on file  Occupational History  . Not on file  Tobacco Use  . Smoking status: Former Smoker    Packs/day: 0.25    Years: 45.00    Pack years: 11.25    Types: Cigarettes    Start date: 04/11/1968  . Smokeless tobacco: Never Used  . Tobacco comment: Quit x 8 months this time  Vaping Use  . Vaping Use: Never used  Substance and Sexual Activity  . Alcohol use: No    Alcohol/week: 0.0 standard drinks    Comment: "quit alcohol 2011" Previously drinking socially about twice per month  . Drug use: No  . Sexual activity: Never  Other Topics Concern  . Not on file  Social History Narrative   Lives with wife in a one story home.  Has 3 children.     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     Education: some college.   Social Determinants of Health   Financial Resource Strain: Not on file  Food Insecurity: Not on file  Transportation Needs: Not on file  Physical Activity: Not on file  Stress: Not on file  Social Connections: Not on file  Intimate Partner  Violence: Not on file     BP 140/70   Pulse 64   Ht 5\' 10"  (1.778 m)   Wt 227 lb 6.4 oz (103.1 kg)   SpO2 96%   BMI 32.63 kg/m   Physical Exam:  Well appearing NAD HEENT: Unremarkable Neck:  No JVD, no thyromegally Lymphatics:  No adenopathy Back:  No CVA tenderness Lungs:  Clear with no wheezes HEART:  Regular rate rhythm, no murmurs, no rubs, no clicks Abd:  soft, positive bowel sounds, no organomegally, no rebound, no guarding Ext:  2 plus pulses, no edema, no cyanosis, no clubbing Skin:  No rashes no nodules Neuro:  CN II through XII intact, motor grossly intact  EKG - NSR with atrial pacing  DEVICE  Normal device function.  See PaceArt for details.   Assess/Plan: 1. SVT/Atrial flutter/tachycardia - his last episode was in May 2020. He will continue his current meds. He is mostly maintaining NSR. Continue flecainide 2. Sinus node dysfunction - he is pacing 2/3 of the time in the atrium. He is asymptomatic, s/p PPM insertion. 3. HTN - his SBP is reasonably well controlled. He is encouraged to lose weight.  4. PPM - his St. Jude DDD PM is working normally. We will follow.   Carleene Overlie Walterine Amodei,MD

## 2020-05-12 NOTE — Patient Instructions (Signed)
Medication Instructions:  Your physician recommends that you continue on your current medications as directed. Please refer to the Current Medication list given to you today.  *If you need a refill on your cardiac medications before your next appointment, please call your pharmacy*   Lab Work: None today If you have labs (blood work) drawn today and your tests are completely normal, you will receive your results only by: . MyChart Message (if you have MyChart) OR . A paper copy in the mail If you have any lab test that is abnormal or we need to change your treatment, we will call you to review the results.   Testing/Procedures: None today   Follow-Up: At [facility], you and your health needs are our priority.  As part of our continuing mission to provide you with exceptional heart care, we have created designated Provider Care Teams.  These Care Teams include your primary Cardiologist (physician) and Advanced Practice Providers (APPs -  Physician Assistants and Nurse Practitioners) who all work together to provide you with the care you need, when you need it.  We recommend signing up for the patient portal called "MyChart".  Sign up information is provided on this After Visit Summary.  MyChart is used to connect with patients for Virtual Visits (Telemedicine).  Patients are able to view lab/test results, encounter notes, upcoming appointments, etc.  Non-urgent messages can be sent to your provider as well.   To learn more about what you can do with MyChart, go to [email or web address].    Your next appointment:   12 month(s)  The format for your next appointment:   In Person  Provider:   Gregg Taylor, MD   Other Instructions None       Thank you for choosing [facility] !         

## 2020-05-15 ENCOUNTER — Other Ambulatory Visit: Payer: Self-pay | Admitting: Nurse Practitioner

## 2020-05-15 DIAGNOSIS — K7460 Unspecified cirrhosis of liver: Secondary | ICD-10-CM

## 2020-05-15 NOTE — Telephone Encounter (Signed)
Please reach out to patient and let him know that he needs to arrange an office visit to follow-up.  He has not been seen since 2020.  It is very important that he keeps close follow-up due to history of cirrhosis. Please also mail letter.

## 2020-05-18 ENCOUNTER — Telehealth: Payer: Self-pay | Admitting: Internal Medicine

## 2020-05-18 ENCOUNTER — Encounter: Payer: Self-pay | Admitting: Internal Medicine

## 2020-05-18 MED ORDER — DEXCOM G6 RECEIVER DEVI: 0 refills | Status: AC

## 2020-05-18 NOTE — Telephone Encounter (Signed)
Sent letter stating he needed to make an appointment

## 2020-05-18 NOTE — Telephone Encounter (Signed)
Lmom, waiting on a return call.  

## 2020-05-18 NOTE — Telephone Encounter (Signed)
[facility] pharmacy called to request a prescription for a Dexcom G6 receiver. They have received the fax for the  Dexcom G6 sensor and transmitter, insurance requires receiver for pt.   Fax# [phone] or call/ e-scribe [facility] pharmacy

## 2020-05-18 NOTE — Telephone Encounter (Signed)
Sent Rx for Dexcom G6 receiver as request to [facility] pharmcies.

## 2020-05-28 ENCOUNTER — Ambulatory Visit: Payer: Medicare HMO | Admitting: Cardiology

## 2020-05-29 ENCOUNTER — Encounter: Payer: Self-pay | Admitting: *Deleted

## 2020-05-29 ENCOUNTER — Encounter: Payer: Self-pay | Admitting: Cardiology

## 2020-05-29 ENCOUNTER — Other Ambulatory Visit: Payer: Self-pay

## 2020-05-29 ENCOUNTER — Ambulatory Visit: Payer: Medicare HMO | Admitting: Cardiology

## 2020-05-29 VITALS — BP 120/60 | HR 70 | Ht 70.0 in | Wt 231.4 lb

## 2020-05-29 DIAGNOSIS — Z950 Presence of cardiac pacemaker: Secondary | ICD-10-CM | POA: Diagnosis not present

## 2020-05-29 DIAGNOSIS — I10 Essential (primary) hypertension: Secondary | ICD-10-CM | POA: Diagnosis not present

## 2020-05-29 DIAGNOSIS — I4891 Unspecified atrial fibrillation: Secondary | ICD-10-CM | POA: Diagnosis not present

## 2020-05-29 NOTE — Patient Instructions (Signed)

## 2020-05-29 NOTE — Progress Notes (Signed)
Clinical Summary Mr. Etchison is a 70 y.o.male  seen today for follow up of the following medical problems.  1. Bradycardia/sinus arrest  - St Jude dual chamber pacemaker pacemaker implanted Sept 2013 ([place] [address] [employer]).   No symptoms, recent normal device check with EP appt 05/2020  2. Afib/aflutter - previous side effects on multaq - seen by EP 06/28/13, started on flecanide. Continued to have symptoms. Had RF ablation of flutter by Dr Lovena Le 07/10/13. 10/2014 device check did show some AF burden - on nadolol for history of palpitations as well as esoph varices   - changed from nadolol to propranolol due to insurance (has varices as well).   - rare palpitations - compliant with meds - no bleeding on xarelto  3. HTN  - compliant with meds  4. HL  - labs followed by pcp  - 01/2019 TC 152 TG 226 HDL 42 LDL 73 - compliant with statin, fish oil  5. NASH cirrhosis  - followed by GI  - history of grade I varices, no history of bleeding on anticoag    6. COPD  - compliant with inhalers and home oxygen. - followed by pulmonary  7. Orthostatic dizziness - no recent symptoms  8. DM2 - followed by endo   SH: completed covid vaccine.     Past Medical History:  Diagnosis Date  . Anginal pain ([place])   . Arthritis    "back; fingers" (01/06/2012)  . Atrial flutter [facility])    s/p EPS +RF ablation of typical atrial flutter April 2015  . Cancer ([place])    skin cancer  . CHB (complete heart block) ([place])   . CHF (congestive heart failure) ([place]) 01/06/2012  . Chronic lower back pain   . Cirrhosis ([place])    NASH-Hep A and B immune  . Coughing up blood    "comes from my throat" (01/06/2012)  . DDD (degenerative disc disease), lumbar   . Depressed   . Difficult intubation    Eschmann stylet used in 2002 and 2007; "trouble waking up afterwards" (01/06/2012)  . Emphysema   . Fatty liver disease, nonalcoholic   . GERD (gastroesophageal reflux  disease)   . H/O hiatal hernia   . Headache   . History of esophageal varices   . Hypertension   . Hypothyroidism   . Orthostatic dizziness   . Pacemaker   . Pneumonia Aug 2016  . Presence of permanent cardiac pacemaker 9/292013   St.Jude  . Sinus pause 01/06/2012   5.2 seconds  . Sleep apnea    "don't wear mask" (01/06/2012)  . Stroke [facility])    pt states that he might have had a stroke not sure  . Type II diabetes mellitus ([place])   . Varicose vein    of esophagus     Allergies  Allergen Reactions  . Nitroglycerin Hives, Swelling and Rash     Current Outpatient Medications  Medication Sig Dispense Refill  . ACCU-CHEK GUIDE test strip TEST TWICE A DAY BEFORE MEALS 200 strip 6  . Blood Glucose Monitoring Suppl (ACCU-CHEK AVIVA) device by Other route. Use as instructed to check blood sugar 2 times daily    . Continuous Blood Gluc Receiver (DEXCOM G6 RECEIVER) DEVI Use as instruct to check blood sugar daily 1 each 0  . Continuous Blood Gluc Sensor (DEXCOM G6 SENSOR) MISC 1 Device by Does not apply route as directed. 9 each 3  . Continuous Blood Gluc Transmit (DEXCOM G6  TRANSMITTER) MISC 1 Device by Does not apply route as directed. 1 each 3  . dapagliflozin propanediol (FARXIGA) 10 MG TABS tablet Take 10 mg by mouth daily. 90 tablet 0  . diazepam (VALIUM) 10 MG tablet Take 1 tablet by mouth daily as needed.    . diltiazem (CARDIZEM CD) 300 MG 24 hr capsule TAKE 1 CAPSULE BY MOUTH EVERY DAY 90 capsule 3  . escitalopram (LEXAPRO) 10 MG tablet Take 1 tablet by mouth daily.    . flecainide (TAMBOCOR) 100 MG tablet TAKE 1 TABLET BY MOUTH TWICE A DAY 180 tablet 3  . furosemide (LASIX) 20 MG tablet TAKE 2 TABLETS BY MOUTH DAILY AS NEEDED FOR SWELLING 180 tablet 3  . insulin aspart protamine - aspart (NOVOLOG MIX 70/30 FLEXPEN) (70-30) 100 UNIT/ML FlexPen Max daily 42 units 15 mL 11  . Insulin Pen Needle (PEN NEEDLES) 32G X 4 MM MISC 1 Device by Does not apply route 2 (two) times daily.  100 each 11  . levothyroxine (SYNTHROID, LEVOTHROID) 112 MCG tablet TAKE 1 TABLET BY MOUTH EVERY MORNING BEFORE BREAKFAST (Patient taking differently: Take 112 mcg by mouth every morning.) 30 tablet 0  . metFORMIN (GLUCOPHAGE) 1000 MG tablet TAKE 1 TABLET BY MOUTH 2 TIMES DAILY WITH A MEAL. 180 tablet 1  . Omega-3 Fatty Acids (FISH OIL) 1200 MG CAPS Take by mouth 2 (two) times daily.    . pantoprazole (PROTONIX) 40 MG tablet TAKE 1 TABLET BY MOUTH EVERY DAY 90 tablet 3  . propranolol (INDERAL) 20 MG tablet TAKE 1 TABLET BY MOUTH TWICE A DAY 60 tablet 2  . rivaroxaban (XARELTO) 20 MG TABS tablet TAKE 1 TABLET BY MOUTH EVERY DAY WITH DINNER 90 tablet 0  . simvastatin (ZOCOR) 20 MG tablet TAKE 1 TABLET BY MOUTH EVERY DAY IN THE EVENING 90 tablet 3  . spironolactone (ALDACTONE) 50 MG tablet TAKE 1 TABLET BY MOUTH TWICE A DAY 60 tablet 2   No current facility-administered medications for this visit.     Past Surgical History:  Procedure Laterality Date  . ATRIAL FLUTTER ABLATION N/A 07/10/2013   Procedure: ATRIAL FLUTTER ABLATION;  Surgeon: Evans Lance, MD;  Location: [facility] CATH LAB;  Service: Cardiovascular;  Laterality: N/A;  . BACK SURGERY    . CHOLECYSTECTOMY  1993  . COLONOSCOPY  11/08/2004   JIR:CVELFY rectum, colon, TI.  Marland Kitchen COLONOSCOPY N/A 05/28/2014   Dr. Gala Romney: Redundant colon. single colonic polyp removed as described above. Tubular adenoma  . ESOPHAGEAL DILATION N/A 05/28/2014   Procedure: ESOPHAGEAL DILATION;  Surgeon: Daneil Dolin, MD;  [facility];  Service: Endoscopy;  Laterality: N/A;  . ESOPHAGOGASTRODUODENOSCOPY  11/08/2004   BOF:[id number] esophageal erosions consistent with erosive reflux esophagitis/Areas of hemorrhage and nodularity of the fundal mucosa of uncertain significance, biopsied.  Small hiatal hernia, otherwise normal stomach  . ESOPHAGOGASTRODUODENOSCOPY  2010   Dr. Gala Romney: 3 columns Grade 1 varices, erosive esophagitis, HH, portal gastropathy, normal D1,  D2  . ESOPHAGOGASTRODUODENOSCOPY N/A 05/28/2014   Dr. Gala Romney: MIld erosive reflux esophagitis. Grade 1 esophageal varices. Patent esophagus. No dilation performed. Hiatal hernia.   Marland Kitchen ESOPHAGOGASTRODUODENOSCOPY (EGD) WITH ESOPHAGEAL DILATION N/A 02/14/2013   ENI:DPOEU 1 esophageal varices. Abnormal distal esophagus/status post biopsy after Maloney dilation. Portal gastropathy. Antral erosions-status post biopsy. path negative for H.pylori, benign path.  . LUMBAR [place]; ~ 1995; ~ 1996  . NASAL SEPTUM SURGERY  1992  . nuclear stress test  10/19/2004   No ischemia  .  PERMANENT PACEMAKER INSERTION  01/08/2012   CHB  . PERMANENT PACEMAKER INSERTION N/A 01/09/2012   Procedure: PERMANENT PACEMAKER INSERTION;  Surgeon: Sanda Klein, MD;  Location: [facility] CATH LAB;  Service: Cardiovascular;  Laterality: N/A;  . POSTERIOR FUSION LUMBAR SPINE  1999   L4-5  . SPINAL CORD STIMULATOR IMPLANT  2006  . SPINAL CORD STIMULATOR REMOVAL N/A 01/27/2015   Procedure: LUMBAR SPINAL CORD STIMULATOR REMOVAL;  Surgeon: Kristeen Miss, MD;  Location: [place] NEURO ORS;  Service: Neurosurgery;  Laterality: N/A;  LUMBAR SPINAL CORD STIMULATOR REMOVAL  . TONSILLECTOMY AND ADENOIDECTOMY  1992  . US ECHOCARDIOGRAPHY  12/28/2011   mild LVH,mild mitral annulara ca+,mild MR  . Warthin's tumor excision  1990's   right     Allergies  Allergen Reactions  . Nitroglycerin Hives, Swelling and Rash      Family History  Problem Relation Age of Onset  . Cancer Mother        Deceased, 60  . Ovarian cancer Mother   . Arrhythmia Father   . Other Father        Deceased 67  . Stroke Brother   . Stroke Brother   . Stroke Sister   . Crohn's disease Daughter   . Diabetes Maternal Grandmother   . Colon cancer Neg Hx      Social History Mr. Lacher reports that he has quit smoking. His smoking use included cigarettes. He started smoking about 52 years ago. He has a 11.25 pack-year smoking history. He has never used smokeless  tobacco. Mr. Soler reports no history of alcohol use.   Review of Systems CONSTITUTIONAL: No weight loss, fever, chills, weakness or fatigue.  HEENT: Eyes: No visual loss, blurred vision, double vision or yellow sclerae.No hearing loss, sneezing, congestion, runny nose or sore throat.  SKIN: No rash or itching.  CARDIOVASCULAR: per hp RESPIRATORY: No shortness of breath, cough or sputum.  GASTROINTESTINAL: No anorexia, nausea, vomiting or diarrhea. No abdominal pain or blood.  GENITOURINARY: No burning on urination, no polyuria NEUROLOGICAL: No headache, dizziness, syncope, paralysis, ataxia, numbness or tingling in the extremities. No change in bowel or bladder control.  MUSCULOSKELETAL: No muscle, back pain, joint pain or stiffness.  LYMPHATICS: No enlarged nodes. No history of splenectomy.  PSYCHIATRIC: No history of depression or anxiety.  ENDOCRINOLOGIC: No reports of sweating, cold or heat intolerance. No polyuria or polydipsia.  Marland Kitchen   Physical Examination Today's Vitals   05/29/20 1026  BP: 120/60  Pulse: 70  SpO2: 94%  Weight: 231 lb 6.4 oz (105 kg)  Height: 5\' 10"  (1.778 m)   Body mass index is 33.2 kg/m.; Gen: resting comfortably, no acute distress HEENT: no scleral icterus, pupils equal round and reactive, no palptable cervical adenopathy,  CV: RRR, no m/r/g, no vjd Resp: Clear to auscultation bilaterally GI: abdomen is soft, non-tender, non-distended, normal bowel sounds, no hepatosplenomegaly MSK: extremities are warm, no edema.  Skin: warm, no rash Neuro:  no focal deficits Psych: appropriate affect   Diagnostic Studies     Assessment and Plan  1. Afib/Aflutter - no recent symptoms, continue current meds   2. HTN:  - at goal, continue current meds  3. HL  - Reports PCP is watching his LFTs closely due to an increase and history of NASH, will defer management to PCP    4. Sinus arrest/Pacemaker -no symptoms, recent normal device check.  contineu to monitor   F/u 6 months  Arnoldo Lenis, M.D

## 2020-06-02 ENCOUNTER — Other Ambulatory Visit: Payer: Self-pay | Admitting: Cardiology

## 2020-06-02 ENCOUNTER — Other Ambulatory Visit: Payer: Self-pay | Admitting: Internal Medicine

## 2020-06-02 ENCOUNTER — Other Ambulatory Visit: Payer: Self-pay | Admitting: *Deleted

## 2020-06-02 ENCOUNTER — Encounter: Payer: Self-pay | Admitting: Internal Medicine

## 2020-06-02 MED ORDER — DAPAGLIFLOZIN PROPANEDIOL 10 MG PO TABS: 10.0000 mg | ORAL_TABLET | Freq: Every day | ORAL | 1 refills | Status: DC

## 2020-06-09 ENCOUNTER — Ambulatory Visit (INDEPENDENT_AMBULATORY_CARE_PROVIDER_SITE_OTHER): Payer: Medicare HMO

## 2020-06-09 DIAGNOSIS — I455 Other specified heart block: Secondary | ICD-10-CM

## 2020-06-09 LAB — CUP PACEART REMOTE DEVICE CHECK
Battery Remaining Longevity: 32 mo
Battery Remaining Percentage: 29 %
Battery Voltage: 2.81 V
Brady Statistic AP VP Percent: 1 %
Brady Statistic AP VS Percent: 45 %
Brady Statistic AS VP Percent: 1 %
Brady Statistic AS VS Percent: 55 %
Brady Statistic RA Percent Paced: 45 %
Brady Statistic RV Percent Paced: 1 %
Date Time Interrogation Session: 20220301075340
Implantable Lead Implant Date: 20130920
Implantable Lead Implant Date: 20130920
Implantable Lead Location: 753859
Implantable Lead Location: 753860
Implantable Pulse Generator Implant Date: 20130920
Lead Channel Impedance Value: 300 Ohm
Lead Channel Impedance Value: 440 Ohm
Lead Channel Pacing Threshold Amplitude: 0.75 V
Lead Channel Pacing Threshold Amplitude: 1 V
Lead Channel Pacing Threshold Pulse Width: 0.4 ms
Lead Channel Pacing Threshold Pulse Width: 1 ms
Lead Channel Sensing Intrinsic Amplitude: 1.4 mV
Lead Channel Sensing Intrinsic Amplitude: 4.6 mV
Lead Channel Setting Pacing Amplitude: 2 V
Lead Channel Setting Pacing Amplitude: 2.5 V
Lead Channel Setting Pacing Pulse Width: 1 ms
Lead Channel Setting Sensing Sensitivity: 1 mV
Pulse Gen Model: 2210
Pulse Gen Serial Number: 7393982

## 2020-06-12 DIAGNOSIS — M9902 Segmental and somatic dysfunction of thoracic region: Secondary | ICD-10-CM | POA: Diagnosis not present

## 2020-06-12 DIAGNOSIS — M5441 Lumbago with sciatica, right side: Secondary | ICD-10-CM | POA: Diagnosis not present

## 2020-06-12 DIAGNOSIS — M9903 Segmental and somatic dysfunction of lumbar region: Secondary | ICD-10-CM | POA: Diagnosis not present

## 2020-06-12 DIAGNOSIS — M9905 Segmental and somatic dysfunction of pelvic region: Secondary | ICD-10-CM | POA: Diagnosis not present

## 2020-06-15 DIAGNOSIS — M9905 Segmental and somatic dysfunction of pelvic region: Secondary | ICD-10-CM | POA: Diagnosis not present

## 2020-06-15 DIAGNOSIS — M9903 Segmental and somatic dysfunction of lumbar region: Secondary | ICD-10-CM | POA: Diagnosis not present

## 2020-06-15 DIAGNOSIS — M9902 Segmental and somatic dysfunction of thoracic region: Secondary | ICD-10-CM | POA: Diagnosis not present

## 2020-06-15 DIAGNOSIS — E1165 Type 2 diabetes mellitus with hyperglycemia: Secondary | ICD-10-CM | POA: Diagnosis not present

## 2020-06-15 DIAGNOSIS — M5441 Lumbago with sciatica, right side: Secondary | ICD-10-CM | POA: Diagnosis not present

## 2020-06-17 DIAGNOSIS — M9902 Segmental and somatic dysfunction of thoracic region: Secondary | ICD-10-CM | POA: Diagnosis not present

## 2020-06-17 DIAGNOSIS — M9903 Segmental and somatic dysfunction of lumbar region: Secondary | ICD-10-CM | POA: Diagnosis not present

## 2020-06-17 DIAGNOSIS — M9905 Segmental and somatic dysfunction of pelvic region: Secondary | ICD-10-CM | POA: Diagnosis not present

## 2020-06-17 DIAGNOSIS — M5441 Lumbago with sciatica, right side: Secondary | ICD-10-CM | POA: Diagnosis not present

## 2020-06-17 NOTE — Progress Notes (Signed)
Remote pacemaker transmission.   

## 2020-06-18 ENCOUNTER — Encounter: Payer: Self-pay | Admitting: Internal Medicine

## 2020-06-19 ENCOUNTER — Encounter: Payer: Self-pay | Admitting: Internal Medicine

## 2020-06-22 DIAGNOSIS — M9905 Segmental and somatic dysfunction of pelvic region: Secondary | ICD-10-CM | POA: Diagnosis not present

## 2020-06-22 DIAGNOSIS — M9903 Segmental and somatic dysfunction of lumbar region: Secondary | ICD-10-CM | POA: Diagnosis not present

## 2020-06-22 DIAGNOSIS — M5441 Lumbago with sciatica, right side: Secondary | ICD-10-CM | POA: Diagnosis not present

## 2020-06-22 DIAGNOSIS — M9902 Segmental and somatic dysfunction of thoracic region: Secondary | ICD-10-CM | POA: Diagnosis not present

## 2020-06-23 ENCOUNTER — Other Ambulatory Visit: Payer: Self-pay

## 2020-06-23 ENCOUNTER — Ambulatory Visit: Payer: Medicare HMO | Admitting: Internal Medicine

## 2020-06-23 ENCOUNTER — Encounter: Payer: Self-pay | Admitting: Internal Medicine

## 2020-06-23 VITALS — BP 131/69 | HR 80 | Temp 97.3°F | Ht 70.0 in | Wt 225.8 lb

## 2020-06-23 DIAGNOSIS — Z86010 Personal history of colonic polyps: Secondary | ICD-10-CM

## 2020-06-23 DIAGNOSIS — Z79899 Other long term (current) drug therapy: Secondary | ICD-10-CM

## 2020-06-23 DIAGNOSIS — K219 Gastro-esophageal reflux disease without esophagitis: Secondary | ICD-10-CM

## 2020-06-23 DIAGNOSIS — K7469 Other cirrhosis of liver: Secondary | ICD-10-CM

## 2020-06-23 DIAGNOSIS — K7460 Unspecified cirrhosis of liver: Secondary | ICD-10-CM

## 2020-06-23 NOTE — Progress Notes (Signed)
Primary Care Physician:  Sharilyn Sites, MD Primary Gastroenterologist:  Dr. Gala Romney  Pre-Procedure History & Physical: HPI:  Ralph [name] is a 70 y.o. male here for follow-up of Nash/cirrhosis.  History of colonic adenoma; due for surveillance colonoscopy now  Distant history of grade 1 esophageal varices with no prior variceal hemorrhage currently maintained on Inderal.  He is overdue for follow-up here overdue for hepatoma surveillance and updated labs.  His chronic mild oropharyngeal dysphagia for which he seen speech therapy in the past reported he has dysfunctional hypopharyngeal muscles.  Does not really have any esophageal dysphagia symptoms.  Well-controlled on Protonix 40 mg daily.  He denies lower extremity /abdominal swelling.   History of CHF.  Currently, on Aldactone and Lasix.  He reports some he has stage II chronic kidney disease but is not followed by a nephrologist.  He sees Dr. Hilma Favors regularly Likely anticoagulated due to atrial flutter.  He has a pacer. He has emphysema and CAD.  He continues to smoke!  Past Medical History:  Diagnosis Date  . Anginal pain ([place])   . Arthritis    "back; fingers" (01/06/2012)  . Atrial flutter [facility])    s/p EPS +RF ablation of typical atrial flutter April 2015  . Cancer ([place])    skin cancer  . CHB (complete heart block) ([place])   . CHF (congestive heart failure) ([place]) 01/06/2012  . Chronic lower back pain   . Cirrhosis ([place])    NASH-Hep A and B immune  . Coughing up blood    "comes from my throat" (01/06/2012)  . DDD (degenerative disc disease), lumbar   . Depressed   . Difficult intubation    Eschmann stylet used in 2002 and 2007; "trouble waking up afterwards" (01/06/2012)  . Emphysema   . Fatty liver disease, nonalcoholic   . GERD (gastroesophageal reflux disease)   . H/O hiatal hernia   . Headache   . History of esophageal varices   . Hypertension   . Hypothyroidism   . Orthostatic dizziness   . Pacemaker   .  Pneumonia Aug 2016  . Presence of permanent cardiac pacemaker 9/292013   [employer]ude  . Sinus pause 01/06/2012   5.2 seconds  . Sleep apnea    "don't wear mask" (01/06/2012)  . Stroke [facility])    pt states that he might have had a stroke not sure  . Type II diabetes mellitus ([place])   . Varicose vein    of esophagus    Past Surgical History:  Procedure Laterality Date  . ATRIAL FLUTTER ABLATION N/A 07/10/2013   Procedure: ATRIAL FLUTTER ABLATION;  Surgeon: Evans Lance, MD;  Location: [facility] CATH LAB;  Service: Cardiovascular;  Laterality: N/A;  . BACK SURGERY    . CHOLECYSTECTOMY  1993  . COLONOSCOPY  11/08/2004   JSH:FWYOVZ rectum, colon, TI.  Marland Kitchen COLONOSCOPY N/A 05/28/2014   Dr. Gala Romney: Redundant colon. single colonic polyp removed as described above. Tubular adenoma  . ESOPHAGEAL DILATION N/A 05/28/2014   Procedure: ESOPHAGEAL DILATION;  Surgeon: Daneil Dolin, MD;  [facility];  Service: Endoscopy;  Laterality: N/A;  . ESOPHAGOGASTRODUODENOSCOPY  11/08/2004   CHY:IFOYDX esophageal erosions consistent with erosive reflux esophagitis/Areas of hemorrhage and nodularity of the fundal mucosa of uncertain significance, biopsied.  Small hiatal hernia, otherwise normal stomach  . ESOPHAGOGASTRODUODENOSCOPY  2010   Dr. Gala Romney: 3 columns Grade 1 varices, erosive esophagitis, HH, portal gastropathy, normal D1, D2  . ESOPHAGOGASTRODUODENOSCOPY N/A 05/28/2014  Dr. Gala Romney: MIld erosive reflux esophagitis. Grade 1 esophageal varices. Patent esophagus. No dilation performed. Hiatal hernia.   Marland Kitchen ESOPHAGOGASTRODUODENOSCOPY (EGD) WITH ESOPHAGEAL DILATION N/A 02/14/2013   TMH:DQQIW 1 esophageal varices. Abnormal distal esophagus/status post biopsy after Maloney dilation. Portal gastropathy. Antral erosions-status post biopsy. path negative for H.pylori, benign path.  . LUMBAR [place]; ~ 1995; ~ 1996  . NASAL SEPTUM SURGERY  1992  . nuclear stress test  10/19/2004   No ischemia  . PERMANENT  PACEMAKER INSERTION  01/08/2012   CHB  . PERMANENT PACEMAKER INSERTION N/A 01/09/2012   Procedure: PERMANENT PACEMAKER INSERTION;  Surgeon: Sanda Klein, MD;  Location: [place] CATH LAB;  Service: Cardiovascular;  Laterality: N/A;  . POSTERIOR FUSION LUMBAR SPINE  1999   L4-5  . SPINAL CORD STIMULATOR IMPLANT  2006  . SPINAL CORD STIMULATOR REMOVAL N/A 01/27/2015   Procedure: LUMBAR SPINAL CORD STIMULATOR REMOVAL;  Surgeon: Kristeen Miss, MD;  Location: [place] NEURO ORS;  Service: Neurosurgery;  Laterality: N/A;  LUMBAR SPINAL CORD STIMULATOR REMOVAL  . TONSILLECTOMY AND ADENOIDECTOMY  1992  . US ECHOCARDIOGRAPHY  12/28/2011   mild LVH,mild mitral annulara ca+,mild MR  . 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 Authorizing Provider  ACCU-CHEK GUIDE test strip TEST TWICE A DAY BEFORE MEALS 01/13/20  Yes Shamleffer, Melanie Crazier, MD  Blood Glucose Monitoring Suppl (ACCU-CHEK AVIVA) device by Other route. Use as instructed to check blood sugar 2 times daily   Yes [provider]  Continuous Blood Gluc Receiver (DEXCOM G6 RECEIVER) DEVI Use as instruct to check blood sugar daily 05/18/20  Yes Shamleffer, Melanie Crazier, MD  Continuous Blood Gluc Sensor (DEXCOM G6 SENSOR) MISC 1 Device by Does not apply route as directed. 04/30/20  Yes Shamleffer, Melanie Crazier, MD  Continuous Blood Gluc Transmit (DEXCOM G6 TRANSMITTER) MISC 1 Device by Does not apply route as directed. 04/30/20  Yes Shamleffer, Melanie Crazier, MD  dapagliflozin propanediol (FARXIGA) 10 MG TABS tablet Take 1 tablet (10 mg total) by mouth daily. 06/02/20  Yes Shamleffer, Melanie Crazier, MD  diazepam (VALIUM) 10 MG tablet Take 1 tablet by mouth daily as needed. 05/20/19  Yes [provider]  diltiazem (CARDIZEM CD) 300 MG 24 hr capsule TAKE 1 CAPSULE BY MOUTH EVERY DAY 05/01/20  Yes Branch, Alphonse Guild, MD  escitalopram (LEXAPRO) 10 MG tablet Take  1 tablet by mouth daily. 04/19/19  Yes [provider]  flecainide (TAMBOCOR) 100 MG tablet TAKE 1 TABLET BY MOUTH TWICE A DAY 06/02/20  Yes Evans Lance, MD  furosemide (LASIX) 20 MG tablet TAKE 2 TABLETS BY MOUTH DAILY AS NEEDED FOR SWELLING 01/30/17  Yes Branch, Alphonse Guild, MD  insulin aspart protamine - aspart (NOVOLOG MIX 70/30 FLEXPEN) (70-30) 100 UNIT/ML FlexPen Max daily 42 units 04/30/20  Yes Shamleffer, Melanie Crazier, MD  Insulin Pen Needle (PEN NEEDLES) 32G X 4 MM MISC 1 Device by Does not apply route 2 (two) times daily. 12/30/19  Yes Shamleffer, Melanie Crazier, MD  levothyroxine (SYNTHROID, LEVOTHROID) 112 MCG tablet TAKE 1 TABLET BY MOUTH EVERY MORNING BEFORE BREAKFAST Patient taking differently: Take 112 mcg by mouth every morning. 04/24/17  Yes Nida, Marella Chimes, MD  metFORMIN (GLUCOPHAGE) 1000 MG tablet TAKE 1 TABLET BY MOUTH 2 TIMES DAILY WITH A MEAL. 03/02/20  Yes Shamleffer, Melanie Crazier, MD  Omega-3 Fatty Acids (FISH OIL) 1200 MG CAPS Take by mouth  2 (two) times daily.   Yes [provider]  pantoprazole (PROTONIX) 40 MG tablet TAKE 1 TABLET BY MOUTH EVERY DAY 02/12/20  Yes Carlis Stable, NP  propranolol (INDERAL) 20 MG tablet TAKE 1 TABLET BY MOUTH TWICE A DAY 05/15/20  Yes Jodi Mourning, Kristen S, PA-C  rivaroxaban (XARELTO) 20 MG TABS tablet TAKE 1 TABLET BY MOUTH EVERY DAY WITH DINNER 05/09/18  Yes Evans Lance, MD  simvastatin (ZOCOR) 20 MG tablet TAKE 1 TABLET BY MOUTH EVERY DAY IN THE EVENING 06/02/20  Yes Arnoldo Lenis, MD  spironolactone (ALDACTONE) 50 MG tablet TAKE 1 TABLET BY MOUTH TWICE A DAY 05/15/20  Yes Aliene Altes S, PA-C    Allergies as of 06/23/2020 - Review Complete 06/23/2020  Allergen Reaction Noted  . Nitroglycerin Hives, Swelling, and Rash     Family History  Problem Relation Age of Onset  . Cancer Mother        Deceased, 56  . Ovarian cancer Mother   . Arrhythmia Father   . Other Father        Deceased 6  . Stroke  Brother   . Stroke Brother   . Stroke Sister   . Crohn's disease Daughter   . Diabetes Maternal Grandmother   . Colon cancer Neg Hx     Social History   Socioeconomic History  . Marital status: Married    Spouse name: Not on file  . Number of children: Not on file  . Years of education: Not on file  . Highest education level: Not on file  Occupational History  . Not on file  Tobacco Use  . Smoking status: Current Some Day Smoker    Packs/day: 0.25    Years: 45.00    Pack years: 11.25    Types: Cigarettes    Start date: 04/11/1968  . Smokeless tobacco: Never Used  . Tobacco comment: Quit x 8 months this time  Vaping Use  . Vaping Use: Never used  Substance and Sexual Activity  . Alcohol use: No    Alcohol/week: 0.0 standard drinks    Comment: "quit alcohol 2011" Previously drinking socially about twice per month  . Drug use: No  . Sexual activity: Never  Other Topics Concern  . Not on file  Social History Narrative   Lives with wife in a one story home.  Has 3 children.     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     Education: some college.   Social Determinants of Health   Financial Resource Strain: Not on file  Food Insecurity: Not on file  Transportation Needs: Not on file  Physical Activity: Not on file  Stress: Not on file  Social Connections: Not on file  Intimate Partner Violence: Not on file    Review of Systems: See HPI, otherwise negative ROS  Physical Exam: BP 131/69   Pulse 80   Temp (!) 97.3 F (36.3 C)   Ht 5\' 10"  (1.778 m)   Wt 225 lb 12.8 oz (102.4 kg)   BMI 32.40 kg/m  General:   Alert,  pleasant and cooperative in NAD Mouth:  No deformity or lesions. Neck:  Supple; no masses or thyromegaly. No significant cervical adenopathy. Lungs:  Clear throughout to auscultation.   No wheezes, crackles, or rhonchi. No acute distress. Heart:  Regular rate and rhythm; no murmurs, clicks, rubs,  or gallops. Abdomen: Non-distended, normal bowel  sounds.  Soft and nontender without appreciable mass or hepatosplenomegaly.  Dexcom in  place right lower quadrant Pulses:  Normal pulses noted. Extremities:  Without clubbing or edema.  Impression/Plan: 70 year old gentleman with Nash/cirrhosis in the setting of multiple comorbidities including COPD, congestive heart failure, poorly controlled diabetes and reported chronic kidney disease. He is way behind on cirrhosis care.   Clinically, he appears to remained fairly well compensated. He needs updated labs and imaging of his liver.  History of a colonic adenoma.  I believe he is fit for 1 more surveillance colonoscopy.  Recommendations:   I have offered the patient a surveillance colonoscopy given his history of colonic polyps previously.  The risks, benefits, limitations, alternatives and imponderables have been reviewed with the patient. Potential for esophageal dilation, biopsy, etc. have also been reviewed.  Questions have been answered.  Patient is agreeable.  We will have him hold his Xarelto for 2 days prior to the procedure and will update labs for meld assessment the morning of his colonoscopy (CBC, INR, Chem-12).  Will enlist the assistance of anesthesia with propofol for this nice gentleman.  ASA 3/4.  History of difficult intubation previously..   We will also update ultrasound imaging of his liver  Further recommendations to follow.    Notice: This dictation was prepared with Dragon dictation along with smaller phrase technology. Any transcriptional errors that result from this process are unintentional and may not be corrected upon review.

## 2020-06-23 NOTE — Patient Instructions (Addendum)
  Schedule a surveillance colonoscopy (hx of polyps ) propofol ASA 3  Hold Xarelto for 2 days prior to procedure  Hold Metformin day before procedure  CBC, INR, Chem 12 day of TCS (when Xarelto held x 2 days)  Continue with liver ultrasound checks every 6 months   Further recommendations to follow

## 2020-06-25 ENCOUNTER — Telehealth: Payer: Self-pay | Admitting: *Deleted

## 2020-06-25 NOTE — Telephone Encounter (Signed)
LMOVM for pt to call back to schedule TCS with propofol, ASA 3 Dr. Gala Romney, Diabetic

## 2020-06-26 DIAGNOSIS — M9905 Segmental and somatic dysfunction of pelvic region: Secondary | ICD-10-CM | POA: Diagnosis not present

## 2020-06-26 DIAGNOSIS — M9903 Segmental and somatic dysfunction of lumbar region: Secondary | ICD-10-CM | POA: Diagnosis not present

## 2020-06-26 DIAGNOSIS — M5441 Lumbago with sciatica, right side: Secondary | ICD-10-CM | POA: Diagnosis not present

## 2020-06-26 DIAGNOSIS — M9902 Segmental and somatic dysfunction of thoracic region: Secondary | ICD-10-CM | POA: Diagnosis not present

## 2020-06-29 DIAGNOSIS — M9903 Segmental and somatic dysfunction of lumbar region: Secondary | ICD-10-CM | POA: Diagnosis not present

## 2020-06-29 DIAGNOSIS — M5441 Lumbago with sciatica, right side: Secondary | ICD-10-CM | POA: Diagnosis not present

## 2020-06-29 DIAGNOSIS — M9902 Segmental and somatic dysfunction of thoracic region: Secondary | ICD-10-CM | POA: Diagnosis not present

## 2020-06-29 DIAGNOSIS — M9905 Segmental and somatic dysfunction of pelvic region: Secondary | ICD-10-CM | POA: Diagnosis not present

## 2020-06-30 NOTE — Telephone Encounter (Signed)
Called pt. He requested early am appt and advised will call with June schedule

## 2020-07-01 ENCOUNTER — Ambulatory Visit (HOSPITAL_COMMUNITY)
Admission: RE | Admit: 2020-07-01 | Discharge: 2020-07-01 | Disposition: A | Discharge status: Home / Self Care | Payer: Medicare HMO | Source: Ambulatory Visit | Attending: Internal Medicine | Admitting: Internal Medicine

## 2020-07-01 ENCOUNTER — Other Ambulatory Visit: Payer: Self-pay

## 2020-07-01 ENCOUNTER — Other Ambulatory Visit (HOSPITAL_COMMUNITY)
Admission: RE | Admit: 2020-07-01 | Discharge: 2020-07-01 | Disposition: A | Discharge status: Home / Self Care | Payer: Medicare HMO | Source: Ambulatory Visit | Attending: Internal Medicine | Admitting: Internal Medicine

## 2020-07-01 DIAGNOSIS — K7469 Other cirrhosis of liver: Secondary | ICD-10-CM | POA: Insufficient documentation

## 2020-07-01 DIAGNOSIS — Z79899 Other long term (current) drug therapy: Secondary | ICD-10-CM | POA: Insufficient documentation

## 2020-07-01 DIAGNOSIS — K7460 Unspecified cirrhosis of liver: Secondary | ICD-10-CM

## 2020-07-01 LAB — CBC WITH DIFFERENTIAL/PLATELET
Abs Immature Granulocytes: 0.02 10*3/uL (ref 0.00–0.07)
Basophils Absolute: 0.1 10*3/uL (ref 0.0–0.1)
Basophils Relative: 1 %
Eosinophils Absolute: 0.3 10*3/uL (ref 0.0–0.5)
Eosinophils Relative: 4 %
HCT: 52.1 % — ABNORMAL HIGH (ref 39.0–52.0)
Hemoglobin: 17.3 g/dL — ABNORMAL HIGH (ref 13.0–17.0)
Immature Granulocytes: 0 %
Lymphocytes Relative: 25 %
Lymphs Abs: 1.6 10*3/uL (ref 0.7–4.0)
MCH: 33.9 pg (ref 26.0–34.0)
MCHC: 33.2 g/dL (ref 30.0–36.0)
MCV: 102.2 fL — ABNORMAL HIGH (ref 80.0–100.0)
Monocytes Absolute: 0.6 10*3/uL (ref 0.1–1.0)
Monocytes Relative: 9 %
Neutro Abs: 3.9 10*3/uL (ref 1.7–7.7)
Neutrophils Relative %: 61 %
Platelets: 169 10*3/uL (ref 150–400)
RBC: 5.1 MIL/uL (ref 4.22–5.81)
RDW: 13.2 % (ref 11.5–15.5)
WBC: 6.4 10*3/uL (ref 4.0–10.5)
nRBC: 0 % (ref 0.0–0.2)

## 2020-07-01 LAB — PROTIME-INR
INR: 1.3 — ABNORMAL HIGH (ref 0.8–1.2)
Prothrombin Time: 15.7 seconds — ABNORMAL HIGH (ref 11.4–15.2)

## 2020-07-03 DIAGNOSIS — M9903 Segmental and somatic dysfunction of lumbar region: Secondary | ICD-10-CM | POA: Diagnosis not present

## 2020-07-03 DIAGNOSIS — M9905 Segmental and somatic dysfunction of pelvic region: Secondary | ICD-10-CM | POA: Diagnosis not present

## 2020-07-03 DIAGNOSIS — M9902 Segmental and somatic dysfunction of thoracic region: Secondary | ICD-10-CM | POA: Diagnosis not present

## 2020-07-03 DIAGNOSIS — M5441 Lumbago with sciatica, right side: Secondary | ICD-10-CM | POA: Diagnosis not present

## 2020-07-06 ENCOUNTER — Other Ambulatory Visit: Payer: Self-pay

## 2020-07-06 DIAGNOSIS — M9902 Segmental and somatic dysfunction of thoracic region: Secondary | ICD-10-CM | POA: Diagnosis not present

## 2020-07-06 DIAGNOSIS — M5441 Lumbago with sciatica, right side: Secondary | ICD-10-CM | POA: Diagnosis not present

## 2020-07-06 DIAGNOSIS — M9905 Segmental and somatic dysfunction of pelvic region: Secondary | ICD-10-CM | POA: Diagnosis not present

## 2020-07-06 DIAGNOSIS — M9903 Segmental and somatic dysfunction of lumbar region: Secondary | ICD-10-CM | POA: Diagnosis not present

## 2020-07-06 DIAGNOSIS — K760 Fatty (change of) liver, not elsewhere classified: Secondary | ICD-10-CM

## 2020-07-06 NOTE — Progress Notes (Unsigned)
c 

## 2020-07-10 DIAGNOSIS — M9905 Segmental and somatic dysfunction of pelvic region: Secondary | ICD-10-CM | POA: Diagnosis not present

## 2020-07-10 DIAGNOSIS — M9902 Segmental and somatic dysfunction of thoracic region: Secondary | ICD-10-CM | POA: Diagnosis not present

## 2020-07-10 DIAGNOSIS — M5441 Lumbago with sciatica, right side: Secondary | ICD-10-CM | POA: Diagnosis not present

## 2020-07-10 DIAGNOSIS — M9903 Segmental and somatic dysfunction of lumbar region: Secondary | ICD-10-CM | POA: Diagnosis not present

## 2020-07-17 DIAGNOSIS — I10 Essential (primary) hypertension: Secondary | ICD-10-CM | POA: Diagnosis not present

## 2020-07-17 DIAGNOSIS — E7849 Other hyperlipidemia: Secondary | ICD-10-CM | POA: Diagnosis not present

## 2020-07-17 DIAGNOSIS — Z6834 Body mass index (BMI) 34.0-34.9, adult: Secondary | ICD-10-CM | POA: Diagnosis not present

## 2020-07-17 DIAGNOSIS — G894 Chronic pain syndrome: Secondary | ICD-10-CM | POA: Diagnosis not present

## 2020-07-17 DIAGNOSIS — Z1389 Encounter for screening for other disorder: Secondary | ICD-10-CM | POA: Diagnosis not present

## 2020-07-17 DIAGNOSIS — F3489 Other specified persistent mood disorders: Secondary | ICD-10-CM | POA: Diagnosis not present

## 2020-07-17 DIAGNOSIS — E039 Hypothyroidism, unspecified: Secondary | ICD-10-CM | POA: Diagnosis not present

## 2020-07-17 DIAGNOSIS — Z1331 Encounter for screening for depression: Secondary | ICD-10-CM | POA: Diagnosis not present

## 2020-07-17 DIAGNOSIS — E6609 Other obesity due to excess calories: Secondary | ICD-10-CM | POA: Diagnosis not present

## 2020-07-27 ENCOUNTER — Telehealth: Payer: Self-pay | Admitting: *Deleted

## 2020-07-27 NOTE — Telephone Encounter (Signed)
LMOVM for pt to schedule TCS with propofol, ASA 3 Dr. Gala Romney

## 2020-07-28 NOTE — Telephone Encounter (Signed)
LMOVM

## 2020-07-28 NOTE — Telephone Encounter (Signed)
Letter mailed

## 2020-07-29 NOTE — Telephone Encounter (Signed)
Patient returned call. He wants early am appt. Advised will call with July schedule

## 2020-07-30 ENCOUNTER — Ambulatory Visit (HOSPITAL_COMMUNITY)
Admission: RE | Admit: 2020-07-30 | Discharge: 2020-07-30 | Disposition: A | Discharge status: Home / Self Care | Payer: Medicare HMO | Source: Ambulatory Visit | Attending: Internal Medicine | Admitting: Internal Medicine

## 2020-07-30 ENCOUNTER — Other Ambulatory Visit: Payer: Self-pay

## 2020-07-30 DIAGNOSIS — K7460 Unspecified cirrhosis of liver: Secondary | ICD-10-CM | POA: Diagnosis not present

## 2020-07-30 DIAGNOSIS — G8929 Other chronic pain: Secondary | ICD-10-CM | POA: Diagnosis not present

## 2020-07-30 DIAGNOSIS — R160 Hepatomegaly, not elsewhere classified: Secondary | ICD-10-CM | POA: Diagnosis not present

## 2020-07-30 DIAGNOSIS — K760 Fatty (change of) liver, not elsewhere classified: Secondary | ICD-10-CM | POA: Diagnosis not present

## 2020-07-30 DIAGNOSIS — K3189 Other diseases of stomach and duodenum: Secondary | ICD-10-CM | POA: Diagnosis not present

## 2020-07-30 LAB — POCT I-STAT CREATININE: Creatinine, Ser: 0.8 mg/dL (ref 0.61–1.24)

## 2020-07-30 MED ORDER — IOHEXOL 300 MG/ML  SOLN
100.0000 mL | Freq: Once | INTRAMUSCULAR | Status: AC | PRN
  Administered 2020-07-30: 100 mL via INTRAVENOUS

## 2020-08-03 ENCOUNTER — Other Ambulatory Visit: Payer: Self-pay

## 2020-08-03 DIAGNOSIS — Z79899 Other long term (current) drug therapy: Secondary | ICD-10-CM

## 2020-08-03 DIAGNOSIS — K7460 Unspecified cirrhosis of liver: Secondary | ICD-10-CM

## 2020-08-07 ENCOUNTER — Ambulatory Visit (INDEPENDENT_AMBULATORY_CARE_PROVIDER_SITE_OTHER): Payer: Medicare HMO | Admitting: Orthopedic Surgery

## 2020-08-07 ENCOUNTER — Ambulatory Visit: Payer: Medicare HMO

## 2020-08-07 ENCOUNTER — Other Ambulatory Visit: Payer: Self-pay

## 2020-08-07 ENCOUNTER — Encounter: Payer: Self-pay | Admitting: Orthopedic Surgery

## 2020-08-07 VITALS — BP 139/64 | HR 66 | Ht 70.0 in | Wt 234.0 lb

## 2020-08-07 DIAGNOSIS — M1611 Unilateral primary osteoarthritis, right hip: Secondary | ICD-10-CM

## 2020-08-07 DIAGNOSIS — M7061 Trochanteric bursitis, right hip: Secondary | ICD-10-CM

## 2020-08-07 DIAGNOSIS — Z79899 Other long term (current) drug therapy: Secondary | ICD-10-CM | POA: Diagnosis not present

## 2020-08-07 DIAGNOSIS — M25551 Pain in right hip: Secondary | ICD-10-CM

## 2020-08-07 DIAGNOSIS — K7460 Unspecified cirrhosis of liver: Secondary | ICD-10-CM | POA: Diagnosis not present

## 2020-08-07 NOTE — Progress Notes (Signed)
New Patient Visit  Assessment: RICKIE GANGE is a 70 y.o. male with the following: Right greater trochanteric bursitis  Plan: Exquisite tenderness over the lateral hip.  Unable to lay on his right side.  Discussed medications versus steroid injection today.  He would like to proceed with an injection.  Remain active, use cane to assist with ambulation.  Can repeat injection if provides relief for several months. Follow up as needed.    Procedure note injection - Right greater trochanteric bursa injection   Verbal consent was obtained to inject the Right lateral hip  Timeout was completed to confirm the site of injection.  The most tender spot over the lateral hip was identified by the patient. The skin was prepped with alcohol and ethyl chloride was sprayed at the injection site.  A 21-gauge needle was used to inject 6 mg of Betamethasone and 1% lidocaine (3 cc) into the Right greater trochanteric bursa using a direct lateral approach There were no complications. A sterile bandage was applied.   Follow-up: Return if symptoms worsen or fail to improve.  Subjective:  Chief Complaint  Patient presents with  . Hip Pain    Rt side hip pain for a yr getting worse. Pt states he has had some falls and pain is getting worse. Pt does see a chiropractor and neurologist for long history of back pains    History of Present Illness: JOBE MUTCH is a 70 y.o. male who presents for evaluation of right hip pain.  He has had pain over the lateral hip for the past year.  He has difficulty laying on his right side.  He is unable to take tylenol due to liver problems.  He also has kidney problems, and cannot take NSAIDs.  He has had issues with falling.  He uses a cane.  No PT or focused activities. Some pain radiating distally. Also has a history of lower back issues, and has had multiple surgeries.    Review of Systems: No fevers or chills No numbness or tingling No chest pain No bowel or  bladder dysfunction No GI distress No headaches No nausea or vomiting.    Medical History:  Past Medical History:  Diagnosis Date  . Anginal pain ([place])   . Arthritis    "back; fingers" (01/06/2012)  . Atrial flutter [facility])    s/p EPS +RF ablation of typical atrial flutter April 2015  . Cancer ([place])    skin cancer  . CHB (complete heart block) ([place])   . CHF (congestive heart failure) ([place]) 01/06/2012  . Chronic lower back pain   . 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 Coughing up blood    "comes from my throat" (01/06/2012)  . DDD (degenerative disc disease), lumbar   . Depressed   . Difficult intubation    Eschmann stylet used in 2002 and 2007; "trouble waking up afterwards" (01/06/2012)  . Emphysema   . Fatty liver disease, nonalcoholic   . GERD (gastroesophageal reflux disease)   . H/O hiatal hernia   . Headache   . History of esophageal varices   . Hypertension   . Hypothyroidism   . Orthostatic dizziness   . Pacemaker   . Pneumonia Aug 2016  . Presence of permanent cardiac pacemaker 9/292013   [employer]  . Sinus pause 01/06/2012   5.2 seconds  . Sleep apnea    "don't wear mask" (01/06/2012)  . Stroke [facility])    pt states that  he might have had a stroke not sure  . Type II diabetes mellitus ([place])   . Varicose vein    of esophagus    Past Surgical History:  Procedure Laterality Date  . ATRIAL FLUTTER ABLATION N/A 07/10/2013   Procedure: ATRIAL FLUTTER ABLATION;  Surgeon: Evans Lance, MD;  Location: [facility] CATH LAB;  Service: Cardiovascular;  Laterality: N/A;  . BACK SURGERY    . CHOLECYSTECTOMY  1993  . COLONOSCOPY  11/08/2004   JAS:NKNLZJ rectum, colon, TI.  Marland Kitchen COLONOSCOPY N/A 05/28/2014   Dr. Gala Romney: Redundant colon. single colonic polyp removed as described above. Tubular adenoma  . ESOPHAGEAL DILATION N/A 05/28/2014   Procedure: ESOPHAGEAL DILATION;  Surgeon: Daneil Dolin, MD;  [facility];  Service: Endoscopy;  Laterality: N/A;  .  ESOPHAGOGASTRODUODENOSCOPY  11/08/2004   QBH:ALPFXT esophageal erosions consistent with erosive reflux esophagitis/Areas of hemorrhage and nodularity of the fundal mucosa of uncertain significance, biopsied.  Small hiatal hernia, otherwise normal stomach  . ESOPHAGOGASTRODUODENOSCOPY  2010   Dr. Gala Romney: 3 columns Grade 1 varices, erosive esophagitis, HH, portal gastropathy, normal D1, D2  . ESOPHAGOGASTRODUODENOSCOPY N/A 05/28/2014   Dr. Gala Romney: MIld erosive reflux esophagitis. Grade 1 esophageal varices. Patent esophagus. No dilation performed. Hiatal hernia.   Marland Kitchen ESOPHAGOGASTRODUODENOSCOPY (EGD) WITH ESOPHAGEAL DILATION N/A 02/14/2013   KWI:OXBDZ 1 esophageal varices. Abnormal distal esophagus/status post biopsy after Maloney dilation. Portal gastropathy. Antral erosions-status post biopsy. path negative for H.pylori, benign path.  . LUMBAR [place]; ~ 1995; ~ 1996  . NASAL SEPTUM SURGERY  1992  . nuclear stress test  10/19/2004   No ischemia  . PERMANENT PACEMAKER INSERTION  01/08/2012   CHB  . PERMANENT PACEMAKER INSERTION N/A 01/09/2012   Procedure: PERMANENT PACEMAKER INSERTION;  Surgeon: Sanda Klein, MD;  Location: [facility] CATH LAB;  Service: Cardiovascular;  Laterality: N/A;  . POSTERIOR FUSION LUMBAR SPINE  1999   L4-5  . SPINAL CORD STIMULATOR IMPLANT  2006  . SPINAL CORD STIMULATOR REMOVAL N/A 01/27/2015   Procedure: LUMBAR SPINAL CORD STIMULATOR REMOVAL;  Surgeon: Kristeen Miss, MD;  Location: [place] NEURO ORS;  Service: Neurosurgery;  Laterality: N/A;  LUMBAR SPINAL CORD STIMULATOR REMOVAL  . TONSILLECTOMY AND ADENOIDECTOMY  1992  . US ECHOCARDIOGRAPHY  12/28/2011   mild LVH,mild mitral annulara ca+,mild MR  . Warthin's tumor excision  1990's   right    Family History  Problem Relation Age of Onset  . Cancer Mother        Deceased, 83  . Ovarian cancer Mother   . Arrhythmia Father   . Other Father        Deceased 66  . Stroke Brother   . Stroke Brother   . Stroke Sister    . Crohn's disease Daughter   . Diabetes Maternal Grandmother   . Colon cancer Neg Hx    Social History   Tobacco Use  . Smoking status: Current Some Day Smoker    Packs/day: 0.25    Years: 45.00    Pack years: 11.25    Types: Cigarettes    Start date: 04/11/1968  . Smokeless tobacco: Never Used  . Tobacco comment: Quit x 8 months this time  Vaping Use  . Vaping Use: Never used  Substance Use Topics  . Alcohol use: No    Alcohol/week: 0.0 standard drinks    Comment: "quit alcohol 2011" Previously drinking socially about twice per month  . Drug use: No    Allergies  Allergen  Reactions  . Nitroglycerin Hives, Swelling and Rash    Current Meds  Medication Sig  . ACCU-CHEK GUIDE test strip TEST TWICE A DAY BEFORE MEALS  . Blood Glucose Monitoring Suppl (ACCU-CHEK AVIVA) device by Other route. Use as instructed to check blood sugar 2 times daily  . Continuous Blood Gluc Receiver (DEXCOM G6 RECEIVER) DEVI Use as instruct to check blood sugar daily  . Continuous Blood Gluc Sensor (DEXCOM G6 SENSOR) MISC 1 Device by Does not apply route as directed.  . Continuous Blood Gluc Transmit (DEXCOM G6 TRANSMITTER) MISC 1 Device by Does not apply route as directed.  . dapagliflozin propanediol (FARXIGA) 10 MG TABS tablet Take 1 tablet (10 mg total) by mouth daily.  . diazepam (VALIUM) 10 MG tablet Take 1 tablet by mouth daily as needed.  . diltiazem (CARDIZEM CD) 300 MG 24 hr capsule TAKE 1 CAPSULE BY MOUTH EVERY DAY  . escitalopram (LEXAPRO) 10 MG tablet Take 1 tablet by mouth daily.  . flecainide (TAMBOCOR) 100 MG tablet TAKE 1 TABLET BY MOUTH TWICE A DAY  . furosemide (LASIX) 20 MG tablet TAKE 2 TABLETS BY MOUTH DAILY AS NEEDED FOR SWELLING  . insulin aspart protamine - aspart (NOVOLOG MIX 70/30 FLEXPEN) (70-30) 100 UNIT/ML FlexPen Max daily 42 units  . Insulin Pen Needle (PEN NEEDLES) 32G X 4 MM MISC 1 Device by Does not apply route 2 (two) times daily.  Marland Kitchen levothyroxine (SYNTHROID,  LEVOTHROID) 112 MCG tablet TAKE 1 TABLET BY MOUTH EVERY MORNING BEFORE BREAKFAST (Patient taking differently: Take 112 mcg by mouth every morning.)  . metFORMIN (GLUCOPHAGE) 1000 MG tablet TAKE 1 TABLET BY MOUTH 2 TIMES DAILY WITH A MEAL.  Marland Kitchen Omega-3 Fatty Acids (FISH OIL) 1200 MG CAPS Take by mouth 2 (two) times daily.  . pantoprazole (PROTONIX) 40 MG tablet TAKE 1 TABLET BY MOUTH EVERY DAY  . propranolol (INDERAL) 20 MG tablet TAKE 1 TABLET BY MOUTH TWICE A DAY  . rivaroxaban (XARELTO) 20 MG TABS tablet TAKE 1 TABLET BY MOUTH EVERY DAY WITH DINNER  . simvastatin (ZOCOR) 20 MG tablet TAKE 1 TABLET BY MOUTH EVERY DAY IN THE EVENING  . spironolactone (ALDACTONE) 50 MG tablet TAKE 1 TABLET BY MOUTH TWICE A DAY    Objective: BP 139/64   Pulse 66   Ht 5\' 10"  (1.778 m)   Wt 234 lb (106.1 kg)   BMI 33.58 kg/m   Physical Exam:  General:  Elderly male. No acute distress.  Alert and oriented.  Gait: Ambulates with a cane  Right hip without swelling or deformity.  Tolerates flexion to 90, external rotation to 40, internal rotation to 30 with some discomfort in impingement position.   Bilateral lower legs with psoriatic rashes.  Onychomycosis both feet, most toes.  Tenderness to palpation over the lateral hip.  Consistent with chief complaint pain.     IMAGING: I personally ordered and reviewed the following images   XR of the right hip demonstrates mild to moderate degenerative changes.  No evidence of AVN.  Some joint space remains.  No significant osteophytes but there is a cyst within the lateral aspect of the acetabulum.  Impression: mild to moderate right hip arthritis; cyst within acetabulum   New Medications:  No orders of the defined types were placed in this encounter.     Mordecai Rasmussen, MD  08/07/2020 9:52 PM

## 2020-08-07 NOTE — Patient Instructions (Signed)

## 2020-08-08 LAB — AFP TUMOR MARKER: AFP, Serum, Tumor Marker: 2.5 ng/mL (ref 0.0–8.4)

## 2020-08-31 NOTE — Telephone Encounter (Signed)
Not uncommon to develop some plaque in the aorta with aging. It just means we need to make sure his risk factors for more plaque are well controlled (ie blood sugars, blood pressure, choleseterol, etc)   Zandra Abts MD

## 2020-09-01 ENCOUNTER — Other Ambulatory Visit: Payer: Self-pay | Admitting: Gastroenterology

## 2020-09-01 ENCOUNTER — Other Ambulatory Visit: Payer: Self-pay | Admitting: Internal Medicine

## 2020-09-01 DIAGNOSIS — K7460 Unspecified cirrhosis of liver: Secondary | ICD-10-CM

## 2020-09-08 ENCOUNTER — Ambulatory Visit (INDEPENDENT_AMBULATORY_CARE_PROVIDER_SITE_OTHER): Payer: Medicare HMO

## 2020-09-08 DIAGNOSIS — I455 Other specified heart block: Secondary | ICD-10-CM | POA: Diagnosis not present

## 2020-09-09 LAB — CUP PACEART REMOTE DEVICE CHECK
Battery Remaining Longevity: 28 mo
Battery Remaining Percentage: 25 %
Battery Voltage: 2.8 V
Brady Statistic AP VP Percent: 1 %
Brady Statistic AP VS Percent: 51 %
Brady Statistic AS VP Percent: 1 %
Brady Statistic AS VS Percent: 48 %
Brady Statistic RA Percent Paced: 51 %
Brady Statistic RV Percent Paced: 1 %
Date Time Interrogation Session: 20220601045909
Implantable Lead Implant Date: 20130920
Implantable Lead Implant Date: 20130920
Implantable Lead Location: 753859
Implantable Lead Location: 753860
Implantable Pulse Generator Implant Date: 20130920
Lead Channel Impedance Value: 310 Ohm
Lead Channel Impedance Value: 460 Ohm
Lead Channel Pacing Threshold Amplitude: 0.75 V
Lead Channel Pacing Threshold Amplitude: 1 V
Lead Channel Pacing Threshold Pulse Width: 0.4 ms
Lead Channel Pacing Threshold Pulse Width: 1 ms
Lead Channel Sensing Intrinsic Amplitude: 1.7 mV
Lead Channel Sensing Intrinsic Amplitude: 5.1 mV
Lead Channel Setting Pacing Amplitude: 2 V
Lead Channel Setting Pacing Amplitude: 2.5 V
Lead Channel Setting Pacing Pulse Width: 1 ms
Lead Channel Setting Sensing Sensitivity: 1 mV
Pulse Gen Model: 2210
Pulse Gen Serial Number: 7393982

## 2020-09-14 DIAGNOSIS — E1165 Type 2 diabetes mellitus with hyperglycemia: Secondary | ICD-10-CM | POA: Diagnosis not present

## 2020-09-14 NOTE — Telephone Encounter (Signed)
LMOVM to call back 

## 2020-09-16 NOTE — Telephone Encounter (Signed)
Letter mailed

## 2020-09-17 DIAGNOSIS — E119 Type 2 diabetes mellitus without complications: Secondary | ICD-10-CM | POA: Diagnosis not present

## 2020-10-01 NOTE — Progress Notes (Signed)
Remote pacemaker transmission.   

## 2020-10-29 ENCOUNTER — Other Ambulatory Visit: Payer: Self-pay

## 2020-10-29 ENCOUNTER — Encounter: Payer: Self-pay | Admitting: Internal Medicine

## 2020-10-29 ENCOUNTER — Ambulatory Visit (INDEPENDENT_AMBULATORY_CARE_PROVIDER_SITE_OTHER): Payer: Medicare HMO | Admitting: Internal Medicine

## 2020-10-29 VITALS — BP 128/62 | HR 63 | Ht 70.0 in | Wt 221.0 lb

## 2020-10-29 DIAGNOSIS — E1142 Type 2 diabetes mellitus with diabetic polyneuropathy: Secondary | ICD-10-CM

## 2020-10-29 DIAGNOSIS — E1129 Type 2 diabetes mellitus with other diabetic kidney complication: Secondary | ICD-10-CM | POA: Diagnosis not present

## 2020-10-29 LAB — POCT GLYCOSYLATED HEMOGLOBIN (HGB A1C): Hemoglobin A1C: 8.2 % — AB (ref 4.0–5.6)

## 2020-10-29 NOTE — Patient Instructions (Addendum)
-   Novolog Mix 22 units with Breakfast and 24 units with Supper  - Continue Metformin 1000 mg twice a day  - Continue Farxiga 10 mg daily      -HOW TO TREAT LOW BLOOD SUGARS (Blood sugar LESS THAN 70 MG/DL) Please follow the RULE OF 15 for the treatment of hypoglycemia treatment (when your (blood sugars are less than 70 mg/dL)   STEP 1: Take 15 grams of carbohydrates when your blood sugar is low, which includes:  3-4 GLUCOSE TABS  OR 3-4 OZ OF JUICE OR REGULAR SODA OR ONE TUBE OF GLUCOSE GEL    STEP 2: RECHECK blood sugar in 15 MINUTES STEP 3: If your blood sugar is still low at the 15 minute recheck --> then, go back to STEP 1 and treat AGAIN with another 15 grams of carbohydrates.

## 2020-10-29 NOTE — Progress Notes (Signed)
Name: TENNIS MCKINNON  Age/ Sex: 70 y.o., male   MRN/ DOB: [id number], 1951-03-11     PCP: Sharilyn Sites, MD   Reason for Endocrinology Evaluation: Type 2 Diabetes Mellitus  Initial Endocrine Consultative Visit: 04/18/2018    PATIENT IDENTIFIER: Mr. LAINE GIOVANETTI is a 70 y.o. male with a past medical history of A.Fib (S/P Pacemaker), CHF, HTN, Hypothyroidism and T2DM. The patient has followed with Endocrinology clinic since 04/18/2017 for consultative assistance with management of his diabetes.  DIABETIC HISTORY:  Mr. Claiborne was diagnosed with T2DM in ~ 2009, he has been on oral glycemic agents for years, initially was only on metformin followed by farxiga and levemir added ~ 6 yrs ago but was unable to afford it. His hemoglobin A1c has ranged from 5.7% in 12/2015, peaking at 7.5% in 06/2015.  Soliqua changed to Insulin mix 12/2019, pt noted improvement in chronic nausea with discontinuation of Soliqua      SUBJECTIVE:   During the last visit (04/30/2020): A1c 8. 4 %. , continued metformin and Farxiga and adjusted NovoLog Mix And started Novolog Mix    Today (10/29/2020): Mr. Kuhnert is here for  follow-up appointment on diabetes management He checks his blood sugars 2 times daily, fasting and bedtime. The patient has not had hypoglycemic episodes since the last clinic visit.   Nausea has improved since stopping the soliqua  Denies constipation and diarrhea.      HOME DIABETES REGIMEN:  - Metformin 1000 mg Twice a day with meals  - Farxiga 10 mg once a day  - Novolog Mix 22 to units with breakfast and 20 units with supper- 23 units twice a day      CONTINUOUS GLUCOSE MONITORING RECORD INTERPRETATION    Dates of Recording: 7/8-721/2022  Sensor description:dexcom  Results statistics:   CGM use % of time 93  Average and SD 170/33  Time in range    65    %  % Time Above 180 34  % Time above 250 <1  % Time Below target 0     Glycemic patterns summary: Optimal BG's  during the day, hyperglycemia at night   Hyperglycemic episodes  post supper   Hypoglycemic episodes occurred n/a  Overnight periods: high         DIABETIC COMPLICATIONS: Microvascular complications:   Neuropathy Denies: retinopathy  Last eye exam: Completed 09/2019   Macrovascular complications:  CHF  Denies: CAD, PVD, CVA  HISTORY:  Past Medical History:  Past Medical History:  Diagnosis Date   Anginal pain ([place])    Arthritis    "back; fingers" (01/06/2012)   Atrial flutter ([facility])    s/p EPS +RF ablation of typical atrial flutter April 2015   Cancer [facility])    skin cancer   CHB (complete heart block) ([facility])    CHF (congestive heart failure) ([place]) 01/06/2012   Chronic lower back pain    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   Coughing up blood    "comes from my throat" (01/06/2012)   DDD (degenerative disc disease), lumbar    Depressed    Difficult intubation    Eschmann stylet used in 2002 and 2007; "trouble waking up afterwards" (01/06/2012)   Emphysema    Fatty liver disease, nonalcoholic    GERD (gastroesophageal reflux disease)    H/O hiatal hernia    Headache    History of esophageal varices    Hypertension    Hypothyroidism  Orthostatic dizziness    Pacemaker    Pneumonia Aug 2016   Presence of permanent cardiac pacemaker 9/292013   [facility]   Sinus pause 01/06/2012   5.2 seconds   Sleep apnea    "don't wear mask" (01/06/2012)   Stroke [facility])    pt states that he might have had a stroke not sure   Type II diabetes mellitus ([place])    Varicose vein    of esophagus   Past Surgical History:  Past Surgical History:  Procedure Laterality Date   ATRIAL FLUTTER ABLATION N/A 07/10/2013   Procedure: ATRIAL FLUTTER ABLATION;  Surgeon: Evans Lance, MD;  Location: [facility] CATH LAB;  Service: Cardiovascular;  Laterality: N/A;   BACK SURGERY     CHOLECYSTECTOMY  1993   COLONOSCOPY  11/08/2004   NWG:[id number] rectum, colon, TI.   COLONOSCOPY N/A 05/28/2014    Dr. Gala Romney: Redundant colon. single colonic polyp removed as described above. Tubular adenoma   ESOPHAGEAL DILATION N/A 05/28/2014   Procedure: ESOPHAGEAL DILATION;  Surgeon: Daneil Dolin, MD;  [facility];  Service: Endoscopy;  Laterality: N/A;   ESOPHAGOGASTRODUODENOSCOPY  11/08/2004   YQM:VHQION esophageal erosions consistent with erosive reflux esophagitis/Areas of hemorrhage and nodularity of the fundal mucosa of uncertain significance, biopsied.  Small hiatal hernia, otherwise normal stomach   ESOPHAGOGASTRODUODENOSCOPY  2010   Dr. Gala Romney: 3 columns Grade 1 varices, erosive esophagitis, HH, portal gastropathy, normal D1, D2   ESOPHAGOGASTRODUODENOSCOPY N/A 05/28/2014   Dr. Gala Romney: MIld erosive reflux esophagitis. Grade 1 esophageal varices. Patent esophagus. No dilation performed. Hiatal hernia.    ESOPHAGOGASTRODUODENOSCOPY (EGD) WITH ESOPHAGEAL DILATION N/A 02/14/2013   GEX:BMWUX 1 esophageal varices. Abnormal distal esophagus/status post biopsy after Maloney dilation. Portal gastropathy. Antral erosions-status post biopsy. path negative for H.pylori, benign path.   [place]; ~ 1995; ~ [place]   nuclear stress test  10/19/2004   No ischemia   PERMANENT PACEMAKER INSERTION  01/08/2012   CHB   PERMANENT PACEMAKER INSERTION N/A 01/09/2012   Procedure: PERMANENT PACEMAKER INSERTION;  Surgeon: Sanda Klein, MD;  Location: [place] CATH LAB;  Service: Cardiovascular;  Laterality: N/A;   POSTERIOR FUSION LUMBAR SPINE  1999   L4-5   SPINAL CORD STIMULATOR IMPLANT  2006   SPINAL CORD STIMULATOR REMOVAL N/A 01/27/2015   Procedure: LUMBAR SPINAL CORD STIMULATOR REMOVAL;  Surgeon: Kristeen Miss, MD;  Location: [place] NEURO ORS;  Service: Neurosurgery;  Laterality: N/A;  LUMBAR SPINAL CORD STIMULATOR REMOVAL   TONSILLECTOMY AND ADENOIDECTOMY  1992   US ECHOCARDIOGRAPHY  12/28/2011   mild LVH,mild mitral annulara ca+,mild MR   [place]'s tumor excision   1990's   right   Social History:  reports that he has been smoking cigarettes. He started smoking about 52 years ago. He has a 11.25 pack-year smoking history. He has never used smokeless tobacco. He reports that he does not drink alcohol and does not use drugs. Family History:  Family History  Problem Relation Age of Onset   Cancer Mother        Deceased, 27   Ovarian cancer Mother    Arrhythmia Father    Other Father        Deceased 69   Stroke Brother    Stroke Brother    Stroke Sister    Crohn's disease Daughter    Diabetes Maternal Grandmother    Colon cancer Neg Hx      HOME MEDICATIONS: Allergies as  of 10/29/2020       Reactions   Nitroglycerin Hives, Swelling, Rash        Medication List        Accurate as of October 29, 2020 10:15 AM. If you have any questions, ask your nurse or doctor.          Accu-Chek Aviva device by Other route. Use as instructed to check blood sugar 2 times daily   Accu-Chek Guide test strip Generic drug: glucose blood TEST TWICE A DAY BEFORE MEALS   dapagliflozin propanediol 10 MG Tabs tablet Commonly known as: Farxiga Take 1 tablet (10 mg total) by mouth daily.   Dexcom G6 Receiver Devi Use as instruct to check blood sugar daily   Dexcom G6 Sensor Misc 1 Device by Does not apply route as directed.   Dexcom G6 Transmitter Misc 1 Device by Does not apply route as directed.   diazepam 10 MG tablet Commonly known as: VALIUM Take 1 tablet by mouth daily as needed.   diltiazem 300 MG 24 hr capsule Commonly known as: CARDIZEM CD TAKE 1 CAPSULE BY MOUTH EVERY DAY   escitalopram 10 MG tablet Commonly known as: LEXAPRO Take 1 tablet by mouth daily.   Fish Oil 1200 MG Caps Take by mouth 2 (two) times daily.   flecainide 100 MG tablet Commonly known as: TAMBOCOR TAKE 1 TABLET BY MOUTH TWICE A DAY   furosemide 20 MG tablet Commonly known as: LASIX TAKE 2 TABLETS BY MOUTH DAILY AS NEEDED FOR SWELLING   levothyroxine  112 MCG tablet Commonly known as: SYNTHROID TAKE 1 TABLET BY MOUTH EVERY MORNING BEFORE BREAKFAST What changed: See the new instructions.   metFORMIN 1000 MG tablet Commonly known as: GLUCOPHAGE TAKE 1 TABLET BY MOUTH 2 TIMES DAILY WITH A MEAL.   NovoLOG Mix 70/30 FlexPen (70-30) 100 UNIT/ML FlexPen Generic drug: insulin aspart protamine - aspart Max daily 42 units   pantoprazole 40 MG tablet Commonly known as: PROTONIX TAKE 1 TABLET BY MOUTH EVERY DAY   Pen Needles 32G X 4 MM Misc 1 Device by Does not apply route 2 (two) times daily.   propranolol 20 MG tablet Commonly known as: INDERAL TAKE 1 TABLET BY MOUTH TWICE A DAY   rivaroxaban 20 MG Tabs tablet Commonly known as: Xarelto TAKE 1 TABLET BY MOUTH EVERY DAY WITH DINNER   simvastatin 20 MG tablet Commonly known as: ZOCOR TAKE 1 TABLET BY MOUTH EVERY DAY IN THE EVENING   spironolactone 50 MG tablet Commonly known as: ALDACTONE TAKE 1 TABLET BY MOUTH TWICE A DAY         OBJECTIVE:   Vital Signs: BP 128/62   Pulse 63   Ht 5\' 10"  (1.778 m)   Wt 221 lb (100.2 kg)   SpO2 97%   BMI 31.71 kg/m   Wt Readings from Last 3 Encounters:  10/29/20 221 lb (100.2 kg)  08/07/20 234 lb (106.1 kg)  06/23/20 225 lb 12.8 oz (102.4 kg)   Exam: General: Pt appears well and is in NAD  Lungs: Clear with good BS bilat with no rales, rhonchi, or wheezes  Heart: RRR with normal S1 and S2 and no gallops; no murmurs; no rub  Abdomen: Normoactive bowel sounds, soft, nontender, without masses or organomegaly palpable  Extremities: Trace pretibial edema.   Neuro: MS is good with appropriate affect, pt is alert and Ox3   DM foot exam: 10/29/2020    The skin of the feet is without sores or  ulcerations. Multiple plantar callous formation noted The pedal pulses are 2+ on right and 2+ on left. The sensation is decreased to a screening 5.07, 10 gram monofilament bilaterally     DATA REVIEWED: 07/11/2020  BUN/Cr 25/0.8  LDL 66 TSH  1.4  ASSESSMENT / PLAN / RECOMMENDATIONS:   1) Type 2 Diabetes Mellitus, Sub-Optimally  controlled With Neuropathic  complications - Most recent A1c of 8.2%. Goal A1c <7.0 %.    - A1c trending down  - He had his insulin doses mixed up and was using slightly different doses then prescribed  - He also held insulin at supper one time because his pre-meal BG was 110 mg/dL , I have advised him against holding insulin but he may use 50 % less dose if he has concerns about hypoglycemia  - Wil adjust insulin as below  - He is intolerant to GLP-1 agonists due to nausea    MEDICATIONS: Continue metformin 1000 mg twice a day with meals Continue Farxiga 10 mg once a day Novolog Mix 22 units with Breakfast and 24 units with Supper       EDUCATION / INSTRUCTIONS: BG monitoring instructions: Patient is instructed to check his blood sugars 2 times a day, fasting and bedtime. Call [place] [facility] clinic if: BG persistently < 70  I reviewed the Rule of 15 for the treatment of hypoglycemia in detail with the patient. Literature supplied.  2.)  CKD 2:  -Patient is concerned about a diagnosis of CKD 2 through PCPs office, patient is wondering if he needs nephrology referral.  Reassurance has been provided today, I explained to him that as long as his GFR is at or above 60 no need for refill.  We also discussed that GFR slows down with a aging as well as diuretic use, he is on furosemide and spironolactone for liver cirrhosis.    F/U in 6 months  I spent 25 minutes preparing to see the patient by review of recent labs, imaging and procedures, obtaining and reviewing separately obtained history, communicating with the patient, ordering medications, tests or procedures, and documenting clinical information in the EHR including the differential Dx, treatment, and any further evaluation and other management    Signed electronically by: Mack Guise, MD  [facility]  [facility] [address] [place], [place] [address] Phone: [phone] FAX: [phone]   CC: Sharily[address], [place] [address] Phone: [phone]  Fax: [phone]  Return to Endocrinology clinic as below: Future Appointments  Date Time Provider [place]  12/08/2020  7:50 AM [facility] DEVICE REMOTES [facility]  12/09/2020  2:00 PM Arnoldo Lenis, MD [facility]  03/09/2021  7:50 AM [facility] DEVICE REMOTES [facility]  05/03/2021 10:10 AM Kien Mirsky, Melanie Crazier, MD [facility] None  06/08/2021  7:50 AM [facility] DEVICE REMOTES [facility]  09/07/2021  7:50 AM [facility] DEVICE REMOTES [facility]

## 2020-11-09 ENCOUNTER — Encounter: Payer: Self-pay | Admitting: Internal Medicine

## 2020-11-26 ENCOUNTER — Other Ambulatory Visit: Payer: Self-pay | Admitting: Internal Medicine

## 2020-12-03 DIAGNOSIS — E1165 Type 2 diabetes mellitus with hyperglycemia: Secondary | ICD-10-CM | POA: Diagnosis not present

## 2020-12-08 ENCOUNTER — Ambulatory Visit (INDEPENDENT_AMBULATORY_CARE_PROVIDER_SITE_OTHER): Payer: Medicare HMO

## 2020-12-08 DIAGNOSIS — I495 Sick sinus syndrome: Secondary | ICD-10-CM

## 2020-12-08 LAB — CUP PACEART REMOTE DEVICE CHECK
Battery Remaining Longevity: 15 mo
Battery Remaining Percentage: 13 %
Battery Voltage: 2.78 V
Brady Statistic AP VP Percent: 1 %
Brady Statistic AP VS Percent: 55 %
Brady Statistic AS VP Percent: 1 %
Brady Statistic AS VS Percent: 44 %
Brady Statistic RA Percent Paced: 55 %
Brady Statistic RV Percent Paced: 1 %
Date Time Interrogation Session: 20220830074712
Implantable Lead Implant Date: 20130920
Implantable Lead Implant Date: 20130920
Implantable Lead Location: 753859
Implantable Lead Location: 753860
Implantable Pulse Generator Implant Date: 20130920
Lead Channel Impedance Value: 310 Ohm
Lead Channel Impedance Value: 460 Ohm
Lead Channel Pacing Threshold Amplitude: 0.75 V
Lead Channel Pacing Threshold Amplitude: 1 V
Lead Channel Pacing Threshold Pulse Width: 0.4 ms
Lead Channel Pacing Threshold Pulse Width: 1 ms
Lead Channel Sensing Intrinsic Amplitude: 1.9 mV
Lead Channel Sensing Intrinsic Amplitude: 5.5 mV
Lead Channel Setting Pacing Amplitude: 2 V
Lead Channel Setting Pacing Amplitude: 2.5 V
Lead Channel Setting Pacing Pulse Width: 1 ms
Lead Channel Setting Sensing Sensitivity: 1 mV
Pulse Gen Model: 2210
Pulse Gen Serial Number: 7393982

## 2020-12-09 ENCOUNTER — Encounter: Payer: Self-pay | Admitting: *Deleted

## 2020-12-09 ENCOUNTER — Other Ambulatory Visit: Payer: Self-pay

## 2020-12-09 ENCOUNTER — Ambulatory Visit: Payer: Medicare HMO | Admitting: Cardiology

## 2020-12-09 ENCOUNTER — Encounter: Payer: Self-pay | Admitting: Cardiology

## 2020-12-09 VITALS — BP 112/46 | HR 64 | Ht 70.0 in | Wt 222.2 lb

## 2020-12-09 DIAGNOSIS — R000 Tachycardia, unspecified: Secondary | ICD-10-CM | POA: Diagnosis not present

## 2020-12-09 DIAGNOSIS — E782 Mixed hyperlipidemia: Secondary | ICD-10-CM | POA: Diagnosis not present

## 2020-12-09 DIAGNOSIS — I10 Essential (primary) hypertension: Secondary | ICD-10-CM | POA: Diagnosis not present

## 2020-12-09 DIAGNOSIS — I4891 Unspecified atrial fibrillation: Secondary | ICD-10-CM

## 2020-12-09 NOTE — Progress Notes (Signed)
Clinical Summary Ralph [name] is a 70 y.o.male seen today for follow up of the following medical problems.   1. Bradycardia/sinus arrest   - St Jude dual chamber pacemaker pacemaker implanted Sept 2013 ([place] [address] [employer]).      11/2020. Normal function, very brief afib - no recent symptoms.    2. Afib/aflutter - previous side effects on multaq - seen by EP 06/28/13, started on flecanide. Continued to have symptoms. Had RF ablation of flutter by Dr Lovena Le 07/10/13. 10/2014 device check did show some AF burden - on nadolol for history of palpitations as well as esoph varices     - changed from nadolol to propranolol due to insurance (has varices as well).    -rare palpitations at times - no bleeding on xarelto   3. HTN   -he is compliant with meds   4. HL   - labs followed by pcp   - 01/2019 TC 152 TG 226 HDL 42 LDL 73 - needs updated labs   5. NASH cirrhosis   - followed by GI   - history of  grade I varices, no history of bleeding on anticoag       6. COPD   - compliant with inhalers and home oxygen. - followed by pulmonary   7. Orthostatic dizziness - no recent symptoms - denies recent symptoms   8. DM2 - followed by endo Past Medical History:  Diagnosis Date   Anginal pain ([place])    Arthritis    "back; fingers" (01/06/2012)   Atrial flutter ([place])    s/p EPS +RF ablation of typical atrial flutter April 2015   Cancer [facility])    skin cancer   CHB (complete heart block) (HCC)    CHF (congestive heart failure) ([place]) 01/06/2012   Chronic lower back pain    Cirrhosis ([place])    NASH-Hep A and B immune   Coughing up blood    "comes from my throat" (01/06/2012)   DDD (degenerative disc disease), lumbar    Depressed    Difficult intubation    Eschmann stylet used in 2002 and 2007; "trouble waking up afterwards" (01/06/2012)   Emphysema    Fatty liver disease, nonalcoholic    GERD (gastroesophageal reflux disease)    H/O hiatal hernia    Headache     History of esophageal varices    Hypertension    Hypothyroidism    Orthostatic dizziness    Pacemaker    Pneumonia Aug 2016   Presence of permanent cardiac pacemaker 9/292013   St.Jude   Sinus pause 01/06/2012   5.2 seconds   Sleep apnea    "don't wear mask" (01/06/2012)   Stroke [facility])    pt states that he might have had a stroke not sure   Type II diabetes mellitus (HCC)    Varicose vein    of esophagus     Allergies  Allergen Reactions   Nitroglycerin Hives, Swelling and Rash     Current Outpatient Medications  Medication Sig Dispense Refill   ACCU-CHEK GUIDE test strip TEST TWICE A DAY BEFORE MEALS 200 strip 6   Blood Glucose Monitoring Suppl (ACCU-CHEK AVIVA) device by Other route. Use as instructed to check blood sugar 2 times daily     Continuous Blood Gluc Receiver (DEXCOM G6 RECEIVER) DEVI Use as instruct to check blood sugar daily 1 each 0   Continuous Blood Gluc Sensor (DEXCOM G6 SENSOR) MISC 1 Device by Does not apply  route as directed. 9 each 3   Continuous Blood Gluc Transmit (DEXCOM G6 TRANSMITTER) MISC 1 Device by Does not apply route as directed. 1 each 3   diazepam (VALIUM) 10 MG tablet Take 1 tablet by mouth daily as needed.     diltiazem (CARDIZEM CD) 300 MG 24 hr capsule TAKE 1 CAPSULE BY MOUTH EVERY DAY 90 capsule 3   escitalopram (LEXAPRO) 10 MG tablet Take 1 tablet by mouth daily.     FARXIGA 10 MG TABS tablet TAKE 1 TABLET BY MOUTH EVERY DAY 90 tablet 1   flecainide (TAMBOCOR) 100 MG tablet TAKE 1 TABLET BY MOUTH TWICE A DAY 180 tablet 3   furosemide (LASIX) 20 MG tablet TAKE 2 TABLETS BY MOUTH DAILY AS NEEDED FOR SWELLING 180 tablet 3   insulin aspart protamine - aspart (NOVOLOG MIX 70/30 FLEXPEN) (70-30) 100 UNIT/ML FlexPen Max daily 42 units 15 mL 11   Insulin Pen Needle (PEN NEEDLES) 32G X 4 MM MISC 1 Device by Does not apply route 2 (two) times daily. 100 each 11   levothyroxine (SYNTHROID, LEVOTHROID) 112 MCG tablet TAKE 1 TABLET BY MOUTH EVERY  MORNING BEFORE BREAKFAST (Patient taking differently: Take 112 mcg by mouth every morning.) 30 tablet 0   metFORMIN (GLUCOPHAGE) 1000 MG tablet TAKE 1 TABLET BY MOUTH 2 TIMES DAILY WITH A MEAL. 180 tablet 1   Omega-3 Fatty Acids (FISH OIL) 1200 MG CAPS Take by mouth 2 (two) times daily.     pantoprazole (PROTONIX) 40 MG tablet TAKE 1 TABLET BY MOUTH EVERY DAY 90 tablet 3   propranolol (INDERAL) 20 MG tablet TAKE 1 TABLET BY MOUTH TWICE A DAY 180 tablet 1   rivaroxaban (XARELTO) 20 MG TABS tablet TAKE 1 TABLET BY MOUTH EVERY DAY WITH DINNER 90 tablet 0   simvastatin (ZOCOR) 20 MG tablet TAKE 1 TABLET BY MOUTH EVERY DAY IN THE EVENING 90 tablet 3   spironolactone (ALDACTONE) 50 MG tablet TAKE 1 TABLET BY MOUTH TWICE A DAY 180 tablet 1   No current facility-administered medications for this visit.     Past Surgical History:  Procedure Laterality Date   ATRIAL FLUTTER ABLATION N/A 07/10/2013   Procedure: ATRIAL FLUTTER ABLATION;  Surgeon: Ralph Lance, MD;  Location: [place] CATH LAB;  Service: Cardiovascular;  Laterality: N/A;   BACK SURGERY     CHOLECYSTECTOMY  1993   COLONOSCOPY  11/08/2004   [id number] rectum, colon, TI.   COLONOSCOPY N/A 05/28/2014   Dr. Gala [name]: Redundant colon. single colonic polyp removed as described above. Tubular adenoma   ESOPHAGEAL DILATION N/A 05/28/2014   Procedure: ESOPHAGEAL DILATION;  Surgeon: Ralph Dolin, MD;  Location[facility];  Service: Endoscopy;  Laterality: N/A;   ESOPHAGOGASTRODUODENOSCOPY  11/08/2004   [id number] esophageal erosions consistent with erosive reflux esophagitis/Areas of hemorrhage and nodularity of the fundal mucosa of uncertain significance, biopsied.  Small hiatal hernia, otherwise normal stomach   ESOPHAGOGASTRODUODENOSCOPY  2010   Dr. Gala [name]: 3 columns Grade 1 varices, erosive esophagitis, HH, portal gastropathy, normal D1, D2   ESOPHAGOGASTRODUODENOSCOPY N/A 05/28/2014   Dr. Gala [name]: MIld erosive reflux esophagitis. Grade 1  esophageal varices. Patent esophagus. No dilation performed. Hiatal hernia.    ESOPHAGOGASTRODUODENOSCOPY (EGD) WITH ESOPHAGEAL DILATION N/A 02/14/2013   [id number] 1 esophageal varices. Abnormal distal esophagus/status post biopsy after [place]oney dilation. Portal gastropathy. Antral erosions-status post biopsy. path negative for H.pylori, benign path.   [place]; ~ 1995; ~ [place]  1992   nuclear stress test  10/19/2004   No ischemia   PERMANENT PACEMAKER INSERTION  01/08/2012   CHB   PERMANENT PACEMAKER INSERTION N/A 01/09/2012   Procedure: PERMANENT PACEMAKER INSERTION;  Surgeon: Sanda Klein, MD;  Location: [place] CATH LAB;  Service: Cardiovascular;  Laterality: N/A;   POSTERIOR FUSION LUMBAR SPINE  1999   L4-5   SPINAL CORD STIMULATOR IMPLANT  2006   SPINAL CORD STIMULATOR REMOVAL N/A 01/27/2015   Procedure: LUMBAR SPINAL CORD STIMULATOR REMOVAL;  Surgeon: Kristeen Miss, MD;  Location: [place] NEURO ORS;  Service: Neurosurgery;  Laterality: N/A;  LUMBAR SPINAL CORD STIMULATOR REMOVAL   TONSILLECTOMY AND ADENOIDECTOMY  1992   US ECHOCARDIOGRAPHY  12/28/2011   mild LVH,mild mitral annulara ca+,mild MR   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 Augustyniak reports that he has been smoking cigarettes. He started smoking about 52 years ago. He has a 11.25 pack-year smoking history. He has never used smokeless tobacco. Mr. Derksen reports no history of alcohol use.   Review of Systems CONSTITUTIONAL: No weight loss, fever, chills, weakness or fatigue.   HEENT: Eyes: No visual loss, blurred vision, double vision or yellow sclerae.No hearing loss, sneezing, congestion, runny nose or sore throat.  SKIN: No rash or itching.  CARDIOVASCULAR: per hpi RESPIRATORY: No shortness of breath, cough or sputum.  GASTROINTESTINAL: No anorexia, nausea, vomiting or diarrhea. No abdominal pain or blood.  GENITOURINARY: No burning on urination, no polyuria NEUROLOGICAL: No headache, dizziness, syncope, paralysis, ataxia, numbness or tingling in the extremities. No change in bowel or bladder control.  MUSCULOSKELETAL: No muscle, back pain, joint pain or stiffness.  LYMPHATICS: No enlarged nodes. No history of splenectomy.  PSYCHIATRIC: No history of depression or anxiety.  ENDOCRINOLOGIC: No reports of sweating, cold or heat intolerance. No polyuria or polydipsia.  Marland Kitchen   Physical Examination Today's Vitals   12/09/20 1357  BP: (!) 112/46  Pulse: 64  SpO2: 94%  Weight: 222 lb 3.2 oz (100.8 kg)  Height: '5\' 10"'$  (1.778 m)   Body mass index is 31.88 kg/m.  Gen: resting comfortably, no acute distress HEENT: no scleral icterus, pupils equal round and reactive, no palptable cervical adenopathy,  CV: RRR, no m/r/g, no jvd Resp: Clear to auscultation bilaterally GI: abdomen is soft, non-tender, non-distended, normal bowel sounds, no hepatosplenomegaly MSK: extremities are warm, no edema.  Skin: warm, no rash Neuro:  no focal deficits Psych: appropriate affect   Diagnostic Studies     Assessment and Plan  1. Afib/Aflutter - no symptoms, continue current meds     2. HTN:   - he is at goal, continue current meds   3. HL   -  repeat lipid panel   4. Sinus arrest/Pacemaker -recent normal device check, no symptoms - continue to monitor.     F/u  47motsh  JArnoldo Lenis [name]

## 2020-12-09 NOTE — Patient Instructions (Addendum)
Medication Instructions:  Your physician recommends that you continue on your current medications as directed. Please refer to the Current Medication list given to you today.  Labwork: Your physician recommends that you return for a FASTING lipid, CMET, CBC, TSH, Mg. Please do not eat or drink for at least 8 hours when you have this done. You may take your medications that morning with a sip of water. This may be done at [employer] or [employer]  Testing/Procedures: none  Follow-Up: Your physician recommends that you schedule a follow-up appointment in: 6 months in [place].  Any Other Special Instructions Will Be Listed Below (If Applicable).  If you need a refill on your cardiac medications before your next appointment, please call your pharmacy.

## 2020-12-16 DIAGNOSIS — I10 Essential (primary) hypertension: Secondary | ICD-10-CM | POA: Diagnosis not present

## 2020-12-16 DIAGNOSIS — E785 Hyperlipidemia, unspecified: Secondary | ICD-10-CM | POA: Diagnosis not present

## 2020-12-16 DIAGNOSIS — R000 Tachycardia, unspecified: Secondary | ICD-10-CM | POA: Diagnosis not present

## 2020-12-16 DIAGNOSIS — R002 Palpitations: Secondary | ICD-10-CM | POA: Diagnosis not present

## 2020-12-16 DIAGNOSIS — I4891 Unspecified atrial fibrillation: Secondary | ICD-10-CM | POA: Diagnosis not present

## 2020-12-17 LAB — COMPREHENSIVE METABOLIC PANEL
ALT: 53 IU/L — ABNORMAL HIGH (ref 0–44)
AST: 33 IU/L (ref 0–40)
Albumin/Globulin Ratio: 1.8 (ref 1.2–2.2)
Albumin: 4.6 g/dL (ref 3.8–4.8)
Alkaline Phosphatase: 179 IU/L — ABNORMAL HIGH (ref 44–121)
BUN/Creatinine Ratio: 26 — ABNORMAL HIGH (ref 10–24)
BUN: 24 mg/dL (ref 8–27)
Bilirubin Total: 0.5 mg/dL (ref 0.0–1.2)
CO2: 22 mmol/L (ref 20–29)
Calcium: 9.4 mg/dL (ref 8.6–10.2)
Chloride: 96 mmol/L (ref 96–106)
Creatinine, Ser: 0.92 mg/dL (ref 0.76–1.27)
Globulin, Total: 2.6 g/dL (ref 1.5–4.5)
Glucose: 226 mg/dL — ABNORMAL HIGH (ref 65–99)
Potassium: 5.2 mmol/L (ref 3.5–5.2)
Sodium: 136 mmol/L (ref 134–144)
Total Protein: 7.2 g/dL (ref 6.0–8.5)
eGFR: 91 mL/min/{1.73_m2} (ref >59)

## 2020-12-17 LAB — CBC
Hematocrit: 50.4 % (ref 37.5–51.0)
Hemoglobin: 17.9 g/dL — ABNORMAL HIGH (ref 13.0–17.7)
MCH: 34.4 pg — ABNORMAL HIGH (ref 26.6–33.0)
MCHC: 35.5 g/dL (ref 31.5–35.7)
MCV: 97 fL (ref 79–97)
Platelets: 183 10*3/uL (ref 150–450)
RBC: 5.21 x10E6/uL (ref 4.14–5.80)
RDW: 12.1 % (ref 11.6–15.4)
WBC: 7.5 10*3/uL (ref 3.4–10.8)

## 2020-12-17 LAB — LIPID PANEL
Chol/HDL Ratio: 3.4 ratio (ref 0.0–5.0)
Cholesterol, Total: 155 mg/dL (ref 100–199)
HDL: 46 mg/dL (ref >39)
LDL Chol Calc (NIH): 76 mg/dL (ref 0–99)
Triglycerides: 195 mg/dL — ABNORMAL HIGH (ref 0–149)
VLDL Cholesterol Cal: 33 mg/dL (ref 5–40)

## 2020-12-17 LAB — MAGNESIUM: Magnesium: 2 mg/dL (ref 1.6–2.3)

## 2020-12-17 LAB — TSH: TSH: 1.35 u[IU]/mL (ref 0.450–4.500)

## 2020-12-22 ENCOUNTER — Telehealth: Payer: Self-pay | Admitting: Internal Medicine

## 2020-12-22 NOTE — Telephone Encounter (Signed)
Recall sent 

## 2020-12-22 NOTE — Telephone Encounter (Signed)
Recall for ultrasound 

## 2020-12-22 NOTE — Progress Notes (Signed)
Remote pacemaker transmission.   

## 2020-12-23 ENCOUNTER — Encounter: Payer: Self-pay | Admitting: *Deleted

## 2020-12-29 ENCOUNTER — Telehealth: Payer: Self-pay | Admitting: *Deleted

## 2020-12-29 ENCOUNTER — Telehealth: Payer: Self-pay | Admitting: Internal Medicine

## 2020-12-29 DIAGNOSIS — K7460 Unspecified cirrhosis of liver: Secondary | ICD-10-CM

## 2020-12-29 NOTE — Telephone Encounter (Signed)
-----   Message from Arnoldo Lenis, MD sent at 12/20/2020  8:46 AM EDT ----- Labs overall look good. Triglyerides, a type of fat in the blood is a little elevated. Would work on cutting back on sweets, fried foods, sodas  Zandra Abts MD

## 2020-12-29 NOTE — Telephone Encounter (Signed)
[place] scheduled. Letter mailed

## 2020-12-29 NOTE — Telephone Encounter (Signed)
Laurine Blazer, LPN  01/01/3006  6:22 PM EDT Back to Top    Notified via my chart.  Copy to pcp.

## 2020-12-29 NOTE — Telephone Encounter (Signed)
Pt received a letter that it was time to schedule his [place]. [phone]

## 2021-01-12 ENCOUNTER — Ambulatory Visit: Payer: Medicare HMO | Admitting: Podiatry

## 2021-01-12 ENCOUNTER — Encounter: Payer: Self-pay | Admitting: Podiatry

## 2021-01-12 ENCOUNTER — Other Ambulatory Visit: Payer: Self-pay

## 2021-01-12 DIAGNOSIS — M79675 Pain in left toe(s): Secondary | ICD-10-CM

## 2021-01-12 DIAGNOSIS — M79674 Pain in right toe(s): Secondary | ICD-10-CM

## 2021-01-12 DIAGNOSIS — E1142 Type 2 diabetes mellitus with diabetic polyneuropathy: Secondary | ICD-10-CM | POA: Diagnosis not present

## 2021-01-12 DIAGNOSIS — B351 Tinea unguium: Secondary | ICD-10-CM

## 2021-01-12 NOTE — Progress Notes (Signed)
  Subjective:  Patient ID: Ralph [name], male    DOB: [birth year],   MRN: [id number]  Chief Complaint  Patient presents with   Nail Problem    Thick discolored nails  Possible nail fungus     70 y.o. male presents for thickened discolored nails and diabetic foot check. Patient relates his last A1c was 8.2 and was last seen in May by Dr. Hilma Favors  Relates pain in his toenails bilateral. . Denies any other pedal complaints. Denies n/v/f/c.   Past Medical History:  Diagnosis Date   Anginal pain ([place])    Arthritis    "back; fingers" (01/06/2012)   Atrial flutter (HCC)    s/p EPS +RF ablation of typical atrial flutter April 2015   Cancer [facility])    skin cancer   CHB (complete heart block) (HCC)    CHF (congestive heart failure) ([place]) 01/06/2012   Chronic lower back pain    Cirrhosis ([place])    NASH-Hep A and B immune   Coughing up blood    "comes from my throat" (01/06/2012)   DDD (degenerative disc disease), lumbar    Depressed    Difficult intubation    Eschmann stylet used in 2002 and 2007; "trouble waking up afterwards" (01/06/2012)   Emphysema    Fatty liver disease, nonalcoholic    GERD (gastroesophageal reflux disease)    H/O hiatal hernia    Headache    History of esophageal varices    Hypertension    Hypothyroidism    Orthostatic dizziness    Pacemaker    Pneumonia Aug 2016   Presence of permanent cardiac pacemaker 9/292013   St.Jude   Sinus pause 01/06/2012   5.2 seconds   Sleep apnea    "don't wear mask" (01/06/2012)   Stroke ([place])    pt states that he might have had a stroke not sure   Type II diabetes mellitus (HCC)    Varicose vein    of esophagus    Objective:  Physical Exam: Vascular: DP/PT pulses 2/4 bilateral. CFT <3 seconds. Normal hair growth on digits. No edema.  Skin. No lacerations or abrasions bilateral feet. Nails 1-5 b/l are thickened discolored and elongated with subungual debris.  Musculoskeletal: MMT 5/5 bilateral lower extremities in DF, PF,  Inversion and Eversion. Deceased ROM in DF of ankle joint. Tender to nails 1-5 b/l Neurological: Sensation intact to light touch.   Assessment:   1. Type 2 diabetes mellitus with diabetic polyneuropathy, without long-term current use of insulin (HCC)   2. Pain due to onychomycosis of toenail of left foot   3. Pain due to onychomycosis of toenail of right foot      Plan:  Patient was evaluated and treated and all questions answered. -Discussed and educated patient on diabetic foot care, especially with  regards to the vascular, neurological and musculoskeletal systems.  -Stressed the importance of good glycemic control and the detriment of not  controlling glucose levels in relation to the foot. -Discussed supportive shoes at all times and checking feet regularly.  -Mechanically debrided all nails 1-5 bilateral using sterile nail nipper and filed with dremel without incident  -Answered all patient questions -Patient to return  in 3 months for at risk foot care -Patient advised to call the office if any problems or questions arise in the meantime.   Lorenda Peck, DPM

## 2021-01-13 ENCOUNTER — Encounter: Payer: Self-pay | Admitting: Podiatry

## 2021-01-13 NOTE — Telephone Encounter (Signed)
Patient would like the [place] sent to pharmacy on file

## 2021-01-13 NOTE — Telephone Encounter (Signed)
Please advise 

## 2021-01-14 ENCOUNTER — Other Ambulatory Visit: Payer: Self-pay | Admitting: Podiatry

## 2021-01-14 MED ORDER — CICLOPIROX 8 % EX SOLN: Freq: Every day | CUTANEOUS | 0 refills | Status: DC

## 2021-01-14 NOTE — Telephone Encounter (Signed)
Patient has been notified that medication has been sent to pharmacy

## 2021-01-26 ENCOUNTER — Other Ambulatory Visit: Payer: Self-pay

## 2021-01-26 ENCOUNTER — Ambulatory Visit (HOSPITAL_COMMUNITY)
Admission: RE | Admit: 2021-01-26 | Discharge: 2021-01-26 | Disposition: A | Discharge status: Home / Self Care | Payer: Medicare HMO | Source: Ambulatory Visit | Attending: Internal Medicine | Admitting: Internal Medicine

## 2021-01-26 DIAGNOSIS — K7460 Unspecified cirrhosis of liver: Secondary | ICD-10-CM | POA: Diagnosis not present

## 2021-01-27 ENCOUNTER — Telehealth: Payer: Self-pay | Admitting: Internal Medicine

## 2021-01-27 NOTE — Telephone Encounter (Signed)
FYI: Had Dr. Abbey Chatters look over [place] result due to findings. Dr. Abbey Chatters felt like the results could wait until Dr. Gala Romney returned on 03/04/2021. Pt has seen results on myChart, will contact pt to let him know that Dr. Gala Romney would be back on 03/04/2021 and that we will contact him with Dr. Roseanne Kaufman recommendations once Dr. Gala Romney has had a chance to review the results.

## 2021-01-27 NOTE — Telephone Encounter (Signed)
Correction: Dr. Gala Romney will return on 02/01/2021.

## 2021-01-27 NOTE — Telephone Encounter (Signed)
Pt was made aware that Dr. Gala Romney will be reviewing his results upon his return on Monday. Pt verbalized understanding.

## 2021-02-03 ENCOUNTER — Other Ambulatory Visit: Payer: Self-pay

## 2021-02-03 ENCOUNTER — Encounter: Payer: Self-pay | Admitting: Internal Medicine

## 2021-02-03 DIAGNOSIS — K769 Liver disease, unspecified: Secondary | ICD-10-CM

## 2021-02-03 DIAGNOSIS — K7460 Unspecified cirrhosis of liver: Secondary | ICD-10-CM

## 2021-02-03 MED ORDER — PEN NEEDLES 32G X 4 MM MISC: 1.0000 | Freq: Two times a day (BID) | 11 refills | Status: DC

## 2021-02-05 ENCOUNTER — Encounter (HOSPITAL_COMMUNITY): Payer: Self-pay

## 2021-02-05 NOTE — Progress Notes (Signed)
Ralph, [name] Legal Sex  Male DOB  [birth year] SSN  [id number] Address  [place] [address] Phone  [phone] [facility])  [phone] (Mobile) *Preferred*    RE: US BIOPSY (LIVER) Received: Scarlette Ar, MD  Valli Glance Approved for ultrasound guided liver mass biopsy.  Recommend using contrast enhanced ultrasound to evaluate prior to biopsy as this may obviate need for biopsy/increase diagnostic accuracy.    [name]        Previous Messages   ----- Message -----  From: Valli Glance  Sent: 02/03/2021   7:42 PM EDT  To: Ir Procedure Requests  Subject: US BIOPSY (LIVER)                               US BIOPSY (LIVER)     Reason: Liver lesion, Cirrhosis of liver without ascites, unspecified hepatic cirrhosis type     History: [place] of abdomen and CT of Abdomen IN Computer     Provider: Daneil Dolin     Contact: [phone]

## 2021-02-10 ENCOUNTER — Encounter (HOSPITAL_COMMUNITY): Payer: Self-pay | Admitting: Radiology

## 2021-02-10 NOTE — Progress Notes (Signed)
Patient Name  Ralph [name], Ralph [name] Legal Sex  Male DOB  Aug 24, 1950 SSN  [id number] Address  [address]  [place] [address] Phone  [phone] [facility])  [phone] (Mobile) *Preferred*    RE: Xarelto, Biopsy Received: Artis Delay, MD  Garth Bigness D He can hold xarelto for 2 days before the biopsy if needed. GT        Previous Messages   ----- Message -----  From: Garth Bigness D  Sent: 02/08/2021   4:17 PM EDT  To: Evans Lance, MD  Subject: Xarelto, Biopsy                                 Good morning Dr. Lovena Le, [name] is scheduled for a biopsy on 02/18/21 and I noticed that he is on Xarelto and will need to hold for 1 day prior to his biopsy. Please advise if okay to hold.  Thanks Aniceto Boss

## 2021-02-11 ENCOUNTER — Telehealth: Payer: Self-pay | Admitting: Internal Medicine

## 2021-02-11 NOTE — Telephone Encounter (Signed)
RECALL FOR ULTRASOUND 

## 2021-02-11 NOTE — Telephone Encounter (Signed)
Pt had US done 10/18.

## 2021-02-17 ENCOUNTER — Other Ambulatory Visit: Payer: Self-pay | Admitting: Radiology

## 2021-02-17 NOTE — H&P (Signed)
Chief Complaint: Liver lesion  Referring Physician(s): Manus Rudd  Supervising Physician: Mir, Sharen Heck  Patient Status: [facility] - Out-pt  History of Present Illness: Ralph [name] is a 70 y.o. male with medical issues including cirrhosis and Aflutter on Xarelto, hypertension, diabetes, and pacemaker.  US done 01/26/21 showed= 4.0 cm mass in the left lobe of the liver (previously measuring 3.7 cm). Findings remain indeterminate as this lesion was not well seen on prior CT. Given cirrhotic appearance of the liver, hepatocellular carcinoma is not excluded.  We are asked to perform an image guided biopsy of the lesion.  He is NPO. No nausea/vomiting. No Fever/chills. ROS negative.   Past Medical History:  Diagnosis Date   Anginal pain ([place])    Arthritis    "back; fingers" (01/06/2012)   Atrial flutter (HCC)    s/p EPS +RF ablation of typical atrial flutter April 2015   Cancer [facility])    skin cancer   CHB (complete heart block) (HCC)    CHF (congestive heart failure) ([place]) 01/06/2012   Chronic lower back pain    Cirrhosis ([place])    NASH-Hep A and B immune   Coughing up blood    "comes from my throat" (01/06/2012)   DDD (degenerative disc disease), lumbar    Depressed    Difficult intubation    Eschmann stylet used in 2002 and 2007; "trouble waking up afterwards" (01/06/2012)   Emphysema    Fatty liver disease, nonalcoholic    GERD (gastroesophageal reflux disease)    H/O hiatal hernia    Headache    History of esophageal varices    Hypertension    Hypothyroidism    Orthostatic dizziness    Pacemaker    Pneumonia Aug 2016   Presence of permanent cardiac pacemaker 9/292013   [place]   Sinus pause 01/06/2012   5.2 seconds   Sleep apnea    "don't wear mask" (01/06/2012)   Stroke [facility])    pt states that he might have had a stroke not sure   Type II diabetes mellitus ([place])    Varicose vein    of esophagus    Past Surgical History:  Procedure Laterality Date    ATRIAL FLUTTER ABLATION N/A 07/10/2013   Procedure: ATRIAL FLUTTER ABLATION;  Surgeon: Evans Lance, MD;  Location: [facility] CATH LAB;  Service: Cardiovascular;  Laterality: N/A;   BACK SURGERY     CHOLECYSTECTOMY  1993   COLONOSCOPY  11/08/2004   QJF:HLKTGY rectum, colon, TI.   COLONOSCOPY N/A 05/28/2014   Dr. Gala Romney: Redundant colon. single colonic polyp removed as described above. Tubular adenoma   ESOPHAGEAL DILATION N/A 05/28/2014   Procedure: ESOPHAGEAL DILATION;  Surgeon: Daneil Dolin, MD;  Location: [facility] ENDO SUITE;  Service: Endoscopy;  Laterality: N/A;   ESOPHAGOGASTRODUODENOSCOPY  11/08/2004   BWL:SLHTDS esophageal erosions consistent with erosive reflux esophagitis/Areas of hemorrhage and nodularity of the fundal mucosa of uncertain significance, biopsied.  Small hiatal hernia, otherwise normal stomach   ESOPHAGOGASTRODUODENOSCOPY  2010   Dr. Gala Romney: 3 columns Grade 1 varices, erosive esophagitis, HH, portal gastropathy, normal D1, D2   ESOPHAGOGASTRODUODENOSCOPY N/A 05/28/2014   Dr. Gala Romney: MIld erosive reflux esophagitis. Grade 1 esophageal varices. Patent esophagus. No dilation performed. Hiatal hernia.    ESOPHAGOGASTRODUODENOSCOPY (EGD) WITH ESOPHAGEAL DILATION N/A 02/14/2013   KAJ:GOTLX 1 esophageal varices. Abnormal distal esophagus/status post biopsy after Maloney dilation. Portal gastropathy. Antral erosions-status post biopsy. path negative for H.pylori, benign path.   LUMBAR DISC SURGERY  1994; ~ [place]; ~ [place]   nuclear stress test  10/19/2004   No ischemia   PERMANENT PACEMAKER INSERTION  01/08/2012   CHB   PERMANENT PACEMAKER INSERTION N/A 01/09/2012   Procedure: PERMANENT PACEMAKER INSERTION;  Surgeon: Sanda Klein, MD;  Location: [place] CATH LAB;  Service: Cardiovascular;  Laterality: N/A;   POSTERIOR FUSION LUMBAR SPINE  1999   L4-5   SPINAL CORD STIMULATOR IMPLANT  2006   SPINAL CORD STIMULATOR REMOVAL N/A 01/27/2015   Procedure: LUMBAR SPINAL CORD  STIMULATOR REMOVAL;  Surgeon: Kristeen Miss, MD;  Location: [place] NEURO ORS;  Service: Neurosurgery;  Laterality: N/A;  LUMBAR SPINAL CORD STIMULATOR REMOVAL   TONSILLECTOMY AND ADENOIDECTOMY  1992   US ECHOCARDIOGRAPHY  12/28/2011   mild LVH,mild mitral annulara ca+,mild MR   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 Authorizing Provider  ciclopirox (PENLAC) 8 % solution Apply topically at bedtime. Apply over nail and surrounding skin. Apply daily over previous coat. After seven (7) days, may remove with alcohol and continue cycle. 01/14/21  Yes Lorenda Peck, MD  diazepam (VALIUM) 10 MG tablet Take 1 tablet by mouth every 8 (eight) hours as needed (muscle spasms). 05/20/19  Yes [provider]  diltiazem (CARDIZEM CD) 300 MG 24 hr capsule TAKE 1 CAPSULE BY MOUTH EVERY DAY 05/01/20  Yes Branch, Alphonse Guild, MD  escitalopram (LEXAPRO) 10 MG tablet Take 10 mg by mouth daily. 04/19/19  Yes [provider]  FARXIGA 10 MG TABS tablet TAKE 1 TABLET BY MOUTH EVERY DAY 11/30/20  Yes Shamleffer, Melanie Crazier, MD  flecainide (TAMBOCOR) 100 MG tablet TAKE 1 TABLET BY MOUTH TWICE A DAY 06/02/20  Yes Evans Lance, MD  furosemide (LASIX) 40 MG tablet Take 40 mg by mouth daily as needed for fluid or edema.   Yes [provider]  insulin aspart protamine - aspart (NOVOLOG MIX 70/30 FLEXPEN) (70-30) 100 UNIT/ML FlexPen Max daily 42 units Patient taking differently: Inject 23-24 Units into the skin See admin instructions. Inject 23 units with breakfast and 24 units with evening meal 04/30/20  Yes Shamleffer, Melanie Crazier, MD  levothyroxine (SYNTHROID, LEVOTHROID) 112 MCG tablet TAKE 1 TABLET BY MOUTH EVERY MORNING BEFORE BREAKFAST 04/24/17  Yes Nida, Marella Chimes, MD  metFORMIN (GLUCOPHAGE) 1000 MG tablet TAKE 1 TABLET BY MOUTH 2 TIMES DAILY WITH A MEAL. 09/01/20  Yes  Shamleffer, Melanie Crazier, MD  Omega-3 Fatty Acids (FISH OIL) 1000 MG CAPS Take 1,000-2,000 mg by mouth See admin instructions. Take 2000mg  in the AM and 1000mg  in the PM   Yes [provider]  oxymetazoline (AFRIN) 0.05 % nasal spray Place 1 spray into both nostrils 2 (two) times daily as needed for congestion. Sinex brand   Yes [provider]  pantoprazole (PROTONIX) 40 MG tablet TAKE 1 TABLET BY MOUTH EVERY DAY 02/12/20  Yes Carlis Stable, NP  Polyethyl Glycol-Propyl Glycol (SYSTANE) 0.4-0.3 % SOLN Place 1 drop into both eyes every 6 (six) hours as needed (dry eyes).   Yes [provider]  propranolol (INDERAL) 20 MG tablet TAKE 1 TABLET BY MOUTH TWICE A DAY 09/01/20  Yes Annitta Needs, NP  rivaroxaban (XARELTO) 20 MG TABS tablet TAKE 1 TABLET BY MOUTH EVERY DAY WITH DINNER 05/09/18  Yes Evans Lance, MD  simvastatin (ZOCOR) 20 MG tablet TAKE 1  TABLET BY MOUTH EVERY DAY IN THE EVENING 06/02/20  Yes Branch, Alphonse Guild, MD  spironolactone (ALDACTONE) 50 MG tablet TAKE 1 TABLET BY MOUTH TWICE A DAY 09/01/20  Yes Annitta Needs, NP  ACCU-CHEK GUIDE test strip TEST TWICE A DAY BEFORE MEALS 01/13/20   Shamleffer, Melanie Crazier, MD  Blood Glucose Monitoring Suppl (ACCU-CHEK AVIVA) device by Other route. Use as instructed to check blood sugar 2 times daily    [provider]  Continuous Blood Gluc Receiver (DEXCOM G6 RECEIVER) DEVI Use as instruct to check blood sugar daily 05/18/20   Shamleffer, Melanie Crazier, MD  Continuous Blood Gluc Sensor (DEXCOM G6 SENSOR) MISC 1 Device by Does not apply route as directed. 04/30/20   Shamleffer, Melanie Crazier, MD  Continuous Blood Gluc Transmit (DEXCOM G6 TRANSMITTER) MISC 1 Device by Does not apply route as directed. 04/30/20   Shamleffer, Melanie Crazier, MD  Insulin Pen Needle (PEN NEEDLES) 32G X 4 MM MISC 1 Device by Does not apply route 2 (two) times daily. 02/03/21   Shamleffer, Melanie Crazier, MD     Family History   Problem Relation Age of Onset   Cancer Mother        Deceased, 8   Ovarian cancer Mother    Arrhythmia Father    Other Father        Deceased 6   Stroke Brother    Stroke Brother    Stroke Sister    Crohn's disease Daughter    Diabetes Maternal Grandmother    Colon cancer Neg Hx     Social History   Socioeconomic History   Marital status: Married    Spouse name: Not on file   Number of children: Not on file   Years of education: Not on file   Highest education level: Not on file  Occupational History   Not on file  Tobacco Use   Smoking status: Every Day    Packs/day: 0.25    Years: 45.00    Pack years: 11.25    Types: Cigarettes    Start date: 04/11/1968   Smokeless tobacco: Never   Tobacco comments:    Quit x 8 months this time  Vaping Use   Vaping Use: Never used  Substance and Sexual Activity   Alcohol use: No    Alcohol/week: 0.0 standard drinks    Comment: "quit alcohol 2011" Previously drinking socially about twice per month   Drug use: No   Sexual activity: Never  Other Topics Concern   Not on file  Social History Narrative   Lives with wife in a one story home.  Has 3 children.     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     Education: some college.   Social Determinants of Health   Financial Resource Strain: Not on file  Food Insecurity: Not on file  Transportation Needs: Not on file  Physical Activity: Not on file  Stress: Not on file  Social Connections: Not on file     Review of Systems: A 12 point ROS discussed and pertinent positives are indicated in the HPI above.  All other systems are negative.  Review of Systems  Vital Signs: There were no vitals taken for this visit.  Physical Exam Constitutional:      Appearance: Normal appearance.  HENT:     Head: Normocephalic and atraumatic.  Eyes:     Extraocular Movements: Extraocular movements intact.  Cardiovascular:     Rate and Rhythm: Normal rate and  regular rhythm.   Pulmonary:     Effort: Pulmonary effort is normal. No respiratory distress.     Breath sounds: Normal breath sounds.  Abdominal:     General: There is no distension.     Palpations: Abdomen is soft.     Tenderness: There is no abdominal tenderness.  Musculoskeletal:        General: Normal range of motion.     Cervical back: Normal range of motion.  Skin:    General: Skin is warm and dry.  Neurological:     General: No focal deficit present.     Mental Status: He is alert and oriented to person, place, and time.  Psychiatric:        Mood and Affect: Mood normal.        Behavior: Behavior normal.        Thought Content: Thought content normal.        Judgment: Judgment normal.    Imaging: US ABDOMEN LIMITED RUQ (LIVER/GB)  Result Date: 01/26/2021 CLINICAL DATA:  Cirrhosis follow-up EXAM: ULTRASOUND ABDOMEN LIMITED RIGHT UPPER QUADRANT COMPARISON:  CT abdomen and pelvis 07/30/2020. ultrasound abdomen 07/01/2020. FINDINGS: Gallbladder: Surgically absent. Common bile duct: Diameter: 5.3 mm Liver: Diffusely heterogeneous echotexture with overall increased echogenicity. Nodular contour. There is a hypoechoic heterogeneous mass in the left lobe of the liver measuring 3.3 by 4.0 x 2.1 cm (previously measuring up to 3.7 cm). No new masses are identified. Portal vein is patent on color Doppler imaging with normal direction of blood flow towards the liver. Other: None. IMPRESSION: 1. 4.0 cm mass in the left lobe of the liver (previously measuring 3.7 cm). Findings remain indeterminate as this lesion was not well seen on prior CT. Given cirrhotic appearance of the liver, hepatocellular carcinoma is not excluded. Consider follow-up MRI to further characterize. 2. Cholecystectomy. Electronically Signed   By: Ronney Asters [name]   On: 01/26/2021 21:19    Labs:  CBC: Recent Labs    07/01/20 1045 12/16/20 0826  WBC 6.4 7.5  HGB 17.3* 17.9*  HCT 52.1* 50.4  PLT 169 183    COAGS: Recent Labs     07/01/20 1045  INR 1.3*    BMP: Recent Labs    07/30/20 1630 12/16/20 0826  NA  --  136  K  --  5.2  CL  --  96  CO2  --  22  GLUCOSE  --  226*  BUN  --  24  CALCIUM  --  9.4  CREATININE 0.80 0.92    LIVER FUNCTION TESTS: Recent Labs    12/16/20 0826  BILITOT 0.5  AST 33  ALT 53*  ALKPHOS 179*  PROT 7.2  ALBUMIN 4.6    TUMOR MARKERS: No results for input(s): AFPTM, CEA, CA199, CHROMGRNA in the last 8760 hours.  Assessment and Plan:  4.0 cm mass in the left lobe of the liver   Will proceed with image guided biopsy today by Dr. Dwaine Gale.  Risks and benefits of liver lesion was discussed with the patient and/or patient's family including, but not limited to bleeding, infection, damage to adjacent structures or low yield requiring additional tests.  All of the questions were answered and there is agreement to proceed.  Consent signed and in chart.  Thank you for allowing our service to participate in Ralph [name] 's care.  Electronically Signed: Murrell Redden, PA-C   02/18/2021, 6:44 AM      I spent a total of  30 Minutes in face to face in clinical consultation, greater than 50% of which was counseling/coordinating care for liver lesion biopsy.

## 2021-02-18 ENCOUNTER — Encounter (HOSPITAL_COMMUNITY): Payer: Self-pay

## 2021-02-18 ENCOUNTER — Ambulatory Visit (HOSPITAL_COMMUNITY)
Admission: RE | Admit: 2021-02-18 | Discharge: 2021-02-18 | Disposition: A | Discharge status: Home / Self Care | Payer: Medicare HMO | Source: Ambulatory Visit | Attending: Internal Medicine | Admitting: Internal Medicine

## 2021-02-18 ENCOUNTER — Other Ambulatory Visit: Payer: Self-pay

## 2021-02-18 DIAGNOSIS — I509 Heart failure, unspecified: Secondary | ICD-10-CM | POA: Diagnosis not present

## 2021-02-18 DIAGNOSIS — K7689 Other specified diseases of liver: Secondary | ICD-10-CM | POA: Diagnosis not present

## 2021-02-18 DIAGNOSIS — R160 Hepatomegaly, not elsewhere classified: Secondary | ICD-10-CM | POA: Diagnosis not present

## 2021-02-18 DIAGNOSIS — K7460 Unspecified cirrhosis of liver: Secondary | ICD-10-CM | POA: Diagnosis not present

## 2021-02-18 DIAGNOSIS — I4892 Unspecified atrial flutter: Secondary | ICD-10-CM | POA: Diagnosis not present

## 2021-02-18 DIAGNOSIS — E119 Type 2 diabetes mellitus without complications: Secondary | ICD-10-CM | POA: Diagnosis not present

## 2021-02-18 DIAGNOSIS — Z7901 Long term (current) use of anticoagulants: Secondary | ICD-10-CM | POA: Diagnosis not present

## 2021-02-18 DIAGNOSIS — I110 Hypertensive heart disease with heart failure: Secondary | ICD-10-CM | POA: Diagnosis not present

## 2021-02-18 DIAGNOSIS — C220 Liver cell carcinoma: Secondary | ICD-10-CM | POA: Diagnosis not present

## 2021-02-18 DIAGNOSIS — Z950 Presence of cardiac pacemaker: Secondary | ICD-10-CM | POA: Diagnosis not present

## 2021-02-18 DIAGNOSIS — K769 Liver disease, unspecified: Secondary | ICD-10-CM

## 2021-02-18 LAB — CBC
HCT: 53 % — ABNORMAL HIGH (ref 39.0–52.0)
Hemoglobin: 17.9 g/dL — ABNORMAL HIGH (ref 13.0–17.0)
MCH: 33.6 pg (ref 26.0–34.0)
MCHC: 33.8 g/dL (ref 30.0–36.0)
MCV: 99.4 fL (ref 80.0–100.0)
Platelets: 206 10*3/uL (ref 150–400)
RBC: 5.33 MIL/uL (ref 4.22–5.81)
RDW: 13.1 % (ref 11.5–15.5)
WBC: 9 10*3/uL (ref 4.0–10.5)
nRBC: 0 % (ref 0.0–0.2)

## 2021-02-18 LAB — PROTIME-INR
INR: 1.1 (ref 0.8–1.2)
Prothrombin Time: 13.8 seconds (ref 11.4–15.2)

## 2021-02-18 LAB — GLUCOSE, CAPILLARY: Glucose-Capillary: 188 mg/dL — ABNORMAL HIGH (ref 70–99)

## 2021-02-18 MED ORDER — GELATIN ABSORBABLE 12-7 MM EX MISC
CUTANEOUS | Status: AC
  Filled 2021-02-18: qty 1

## 2021-02-18 MED ORDER — SODIUM CHLORIDE 0.9 % IV SOLN: INTRAVENOUS | Status: DC

## 2021-02-18 MED ORDER — FENTANYL CITRATE (PF) 100 MCG/2ML IJ SOLN
INTRAMUSCULAR | Status: AC
  Filled 2021-02-18: qty 2

## 2021-02-18 MED ORDER — MIDAZOLAM HCL 2 MG/2ML IJ SOLN
INTRAMUSCULAR | Status: AC | PRN
  Administered 2021-02-18: 1 mg via INTRAVENOUS
  Administered 2021-02-18: .5 mg via INTRAVENOUS

## 2021-02-18 MED ORDER — LIDOCAINE HCL (PF) 1 % IJ SOLN
INTRAMUSCULAR | Status: AC
  Filled 2021-02-18: qty 30

## 2021-02-18 MED ORDER — FENTANYL CITRATE (PF) 100 MCG/2ML IJ SOLN
INTRAMUSCULAR | Status: AC | PRN
  Administered 2021-02-18 (×3): 25 ug via INTRAVENOUS

## 2021-02-18 MED ORDER — MIDAZOLAM HCL 2 MG/2ML IJ SOLN
INTRAMUSCULAR | Status: AC
  Filled 2021-02-18: qty 2

## 2021-02-18 NOTE — Procedures (Signed)
Interventional Radiology Procedure Note  Procedure: US guided liver lesion biopsy  Indication: Left liver lesion  Findings: Please refer to procedural dictation for full description.  Complications: None  EBL: < 10 mL  Miachel Roux, MD [phone]

## 2021-02-18 NOTE — Progress Notes (Signed)
Discharge instructions reviewed with pt and his wife both voice understanding.

## 2021-02-19 LAB — SURGICAL PATHOLOGY

## 2021-02-22 ENCOUNTER — Other Ambulatory Visit: Payer: Self-pay | Admitting: Gastroenterology

## 2021-02-22 ENCOUNTER — Telehealth: Payer: Self-pay | Admitting: Internal Medicine

## 2021-02-22 ENCOUNTER — Other Ambulatory Visit: Payer: Self-pay | Admitting: Internal Medicine

## 2021-02-22 DIAGNOSIS — E1165 Type 2 diabetes mellitus with hyperglycemia: Secondary | ICD-10-CM | POA: Diagnosis not present

## 2021-02-22 DIAGNOSIS — K7460 Unspecified cirrhosis of liver: Secondary | ICD-10-CM

## 2021-02-22 DIAGNOSIS — C220 Liver cell carcinoma: Secondary | ICD-10-CM

## 2021-02-22 NOTE — Telephone Encounter (Signed)
done

## 2021-02-22 NOTE — Telephone Encounter (Signed)
Communication noted.  

## 2021-02-22 NOTE — Telephone Encounter (Signed)
ASAP referral faxed to [facility] in [place].

## 2021-02-22 NOTE — Telephone Encounter (Signed)
I informed pt he has liver cancer on a background of cirrhosis.  Minimal varices (2 columns - short Grade 1)  on EGD 2/16.  No stigmata of Portal Htn on recent CT.  Platelet count good.  He's likely not a liver transplant candidate but he may have a shot at resection.  Needs a multi-disciplinary approach. Let"s send him to the liver clinic [facility] ASAP for help on managing this new diagnosis.

## 2021-02-22 NOTE — Addendum Note (Signed)
Addended by: Hassan Rowan on: 02/22/2021 01:48 PM   Modules accepted: Orders

## 2021-02-25 DIAGNOSIS — K7581 Nonalcoholic steatohepatitis (NASH): Secondary | ICD-10-CM | POA: Diagnosis not present

## 2021-02-25 DIAGNOSIS — C220 Liver cell carcinoma: Secondary | ICD-10-CM | POA: Diagnosis not present

## 2021-02-25 DIAGNOSIS — K7460 Unspecified cirrhosis of liver: Secondary | ICD-10-CM | POA: Diagnosis not present

## 2021-02-25 DIAGNOSIS — I8500 Esophageal varices without bleeding: Secondary | ICD-10-CM | POA: Diagnosis not present

## 2021-02-26 ENCOUNTER — Other Ambulatory Visit: Payer: Self-pay | Admitting: Nurse Practitioner

## 2021-02-26 ENCOUNTER — Other Ambulatory Visit (HOSPITAL_COMMUNITY): Payer: Self-pay | Admitting: Nurse Practitioner

## 2021-02-26 ENCOUNTER — Telehealth: Payer: Self-pay | Admitting: Internal Medicine

## 2021-02-26 DIAGNOSIS — C220 Liver cell carcinoma: Secondary | ICD-10-CM | POA: Diagnosis not present

## 2021-02-26 DIAGNOSIS — Z01818 Encounter for other preprocedural examination: Secondary | ICD-10-CM

## 2021-02-26 DIAGNOSIS — K7581 Nonalcoholic steatohepatitis (NASH): Secondary | ICD-10-CM | POA: Diagnosis not present

## 2021-02-26 DIAGNOSIS — K7469 Other cirrhosis of liver: Secondary | ICD-10-CM | POA: Diagnosis not present

## 2021-02-26 NOTE — Telephone Encounter (Signed)
Patient was recently diagnosed with liver cancer.   His oncologist would like for Dr. Lovena Le to order a stress test.  They are afraid he might need a liver transplant, they want to make sure he heart can withstand it.

## 2021-03-01 ENCOUNTER — Telehealth: Payer: Self-pay | Admitting: *Deleted

## 2021-03-01 ENCOUNTER — Encounter: Payer: Self-pay | Admitting: Internal Medicine

## 2021-03-01 ENCOUNTER — Other Ambulatory Visit: Payer: Self-pay | Admitting: Gastroenterology

## 2021-03-01 DIAGNOSIS — K7460 Unspecified cirrhosis of liver: Secondary | ICD-10-CM

## 2021-03-01 DIAGNOSIS — K219 Gastro-esophageal reflux disease without esophagitis: Secondary | ICD-10-CM

## 2021-03-01 MED ORDER — PANTOPRAZOLE SODIUM 40 MG PO TBEC: 40.0000 mg | DELAYED_RELEASE_TABLET | Freq: Every day | ORAL | 3 refills | Status: AC

## 2021-03-01 NOTE — Telephone Encounter (Signed)
Pharmacy fax a refill request  for pantoprazole sod dr 40mg  tab. Qty 90.

## 2021-03-01 NOTE — Telephone Encounter (Signed)
Please order a lexiscan myoview for preoperative risk stratification.

## 2021-03-01 NOTE — Telephone Encounter (Signed)
Rx sent 

## 2021-03-02 ENCOUNTER — Other Ambulatory Visit: Payer: Self-pay | Admitting: Internal Medicine

## 2021-03-02 MED ORDER — INSULIN PEN NEEDLE 31G X 5 MM MISC: 1.0000 | Freq: Four times a day (QID) | 3 refills | Status: DC

## 2021-03-02 MED ORDER — TRESIBA FLEXTOUCH 100 UNIT/ML ~~LOC~~ SOPN: 30.0000 [IU] | PEN_INJECTOR | Freq: Every day | SUBCUTANEOUS | 3 refills | Status: DC

## 2021-03-02 MED ORDER — NOVOLOG FLEXPEN 100 UNIT/ML ~~LOC~~ SOPN: 10.0000 [IU] | PEN_INJECTOR | Freq: Three times a day (TID) | SUBCUTANEOUS | 11 refills | Status: DC

## 2021-03-02 NOTE — Telephone Encounter (Signed)
[place] ordered for preoperative risk stratification per Dr. Lovena Le.

## 2021-03-02 NOTE — Telephone Encounter (Signed)
Pt notified and voiced understanding. Sent to scheduler.

## 2021-03-02 NOTE — Telephone Encounter (Signed)
Spoke to Ralph [name] on 03/02/2021   Discussed his recent dx of hepatocellular carcinoma.  He will have local radiation with the possibility of chemo    BG's > 300 mg/dL   Will stop Novolog mix and start the following regimen    Tresiba 30 units daily  Novolog 10 unit with each meal  CF: Novolog (BG-130/25)   Abby Nena Jordan, MD  [facility] Endocrinology  [facility] [address] [place], [place] [address] Phone: [phone] FAX: [phone]

## 2021-03-03 ENCOUNTER — Ambulatory Visit
Admission: RE | Admit: 2021-03-03 | Discharge: 2021-03-03 | Disposition: A | Discharge status: Home / Self Care | Payer: Medicare HMO | Source: Ambulatory Visit | Attending: Nurse Practitioner | Admitting: Nurse Practitioner

## 2021-03-03 ENCOUNTER — Encounter: Payer: Self-pay | Admitting: *Deleted

## 2021-03-03 ENCOUNTER — Other Ambulatory Visit: Payer: Self-pay

## 2021-03-03 DIAGNOSIS — C220 Liver cell carcinoma: Secondary | ICD-10-CM

## 2021-03-03 HISTORY — PX: IR RADIOLOGIST EVAL & MGMT: IMG5224

## 2021-03-03 NOTE — Consult Note (Signed)
Chief Complaint: Hepatocellular carcinoma, presents via virtual telephone visit to discuss locoregional treatment options  Referring Provider: Annamarie Major, NP  History of Present Illness: Ralph [name] is a 70 y.o. male with history of NASH cirrhosis and multiple comorbidities as listed below including CHF with pacemaker, COPD, diabetes, and hypertension.  He was graciously referred to our clinic from [facility] after establishing care for liver disease.  He was noted to have a left hepatic mass on screening ultrasound on 07/01/20 which prompted a multiphase CT on which the mass was not described but in retrospect was subtly present.  This ultimately prompted ultrasound guided liver mass biopsy on 02/18/21 with pathologic confirmation of moderately differentiated hepatocellular carcinoma.    He has been feeling relatively well.  Denies abdominal pain, nausea, vomiting, change in bowel habits, poor appetite, or jaundice.  Denies ever abusing alcohol, but remaining abstinent for 10 years now, since he was diagnosed with cirrhosis.      Past Medical History:  Diagnosis Date   Anginal pain (HCC)    Arthritis    "back; fingers" (01/06/2012)   Atrial flutter (HCC)    s/p EPS +RF ablation of typical atrial flutter April 2015   Cancer [facility])    skin cancer   CHB (complete heart block) (HCC)    CHF (congestive heart failure) (HCC) 01/06/2012   Chronic lower back pain    Cirrhosis (HCC)    NASH-Hep A and B immune   Coughing up blood    "comes from my throat" (01/06/2012)   DDD (degenerative disc disease), lumbar    Depressed    Difficult intubation    Eschmann stylet used in 2002 and 2007; "trouble waking up afterwards" (01/06/2012)   Emphysema    Fatty liver disease, nonalcoholic    GERD (gastroesophageal reflux disease)    H/O hiatal hernia    Headache    History of esophageal varices    Hypertension    Hypothyroidism    Orthostatic dizziness    Pacemaker    Pneumonia Aug 2016    Presence of permanent cardiac pacemaker 9/292013   [facility]   Sinus pause 01/06/2012   5.2 seconds   Sleep apnea    "don't wear mask" (01/06/2012)   Stroke [facility])    pt states that he might have had a stroke not sure   Type II diabetes mellitus (HCC)    Varicose vein    of esophagus    Past Surgical History:  Procedure Laterality Date   ATRIAL FLUTTER ABLATION N/A 07/10/2013   Procedure: ATRIAL FLUTTER ABLATION;  Surgeon: Marinus Maw, MD;  Location: [facility] CATH LAB;  Service: Cardiovascular;  Laterality: N/A;   BACK SURGERY     CHOLECYSTECTOMY  1993   COLONOSCOPY  11/08/2004   QIL:YKFRJG rectum, colon, TI.   COLONOSCOPY N/A 05/28/2014   Dr. Jena Gauss: Redundant colon. single colonic polyp removed as described above. Tubular adenoma   ESOPHAGEAL DILATION N/A 05/28/2014   Procedure: ESOPHAGEAL DILATION;  Surgeon: Corbin Ade, MD;  Location: [facility] ENDO SUITE;  Service: Endoscopy;  Laterality: N/A;   ESOPHAGOGASTRODUODENOSCOPY  11/08/2004   USO:GVQMEO esophageal erosions consistent with erosive reflux esophagitis/Areas of hemorrhage and nodularity of the fundal mucosa of uncertain significance, biopsied.  Small hiatal hernia, otherwise normal stomach   ESOPHAGOGASTRODUODENOSCOPY  2010   Dr. Jena Gauss: 3 columns Grade 1 varices, erosive esophagitis, HH, portal gastropathy, normal D1, D2   ESOPHAGOGASTRODUODENOSCOPY N/A 05/28/2014   Dr. Jena Gauss: MIld erosive reflux esophagitis. Grade 1  esophageal varices. Patent esophagus. No dilation performed. Hiatal hernia.    ESOPHAGOGASTRODUODENOSCOPY (EGD) WITH ESOPHAGEAL DILATION N/A 02/14/2013   ASN:KNLZJ 1 esophageal varices. Abnormal distal esophagus/status post biopsy after Maloney dilation. Portal gastropathy. Antral erosions-status post biopsy. path negative for H.pylori, benign path.   [place]; ~ 1995; ~ [place]   nuclear stress test  10/19/2004   No ischemia   PERMANENT PACEMAKER INSERTION  01/08/2012   CHB    PERMANENT PACEMAKER INSERTION N/A 01/09/2012   Procedure: PERMANENT PACEMAKER INSERTION;  Surgeon: Sanda Klein, MD;  Location: [place] CATH LAB;  Service: Cardiovascular;  Laterality: N/A;   POSTERIOR FUSION LUMBAR SPINE  1999   L4-5   SPINAL CORD STIMULATOR IMPLANT  2006   SPINAL CORD STIMULATOR REMOVAL N/A 01/27/2015   Procedure: LUMBAR SPINAL CORD STIMULATOR REMOVAL;  Surgeon: Kristeen Miss, MD;  Location: [place] NEURO ORS;  Service: Neurosurgery;  Laterality: N/A;  LUMBAR SPINAL CORD STIMULATOR REMOVAL   TONSILLECTOMY AND ADENOIDECTOMY  1992   US ECHOCARDIOGRAPHY  12/28/2011   mild LVH,mild mitral annulara ca+,mild MR   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 Authorizing Provider  ACCU-CHEK GUIDE test strip TEST TWICE A DAY BEFORE MEALS 01/13/20   Shamleffer, Melanie Crazier, MD  Blood Glucose Monitoring Suppl (ACCU-CHEK AVIVA) device by Other route. Use as instructed to check blood sugar 2 times daily    [provider]  ciclopirox (PENLAC) 8 % solution Apply topically at bedtime. Apply over nail and surrounding skin. Apply daily over previous coat. After seven (7) days, may remove with alcohol and continue cycle. 01/14/21   Lorenda Peck, MD  Continuous Blood Gluc Receiver (DEXCOM G6 RECEIVER) DEVI Use as instruct to check blood sugar daily 05/18/20   Shamleffer, Melanie Crazier, MD  Continuous Blood Gluc Sensor (DEXCOM G6 SENSOR) MISC 1 Device by Does not apply route as directed. 04/30/20   Shamleffer, Melanie Crazier, MD  Continuous Blood Gluc Transmit (DEXCOM G6 TRANSMITTER) MISC 1 Device by Does not apply route as directed. 04/30/20   Shamleffer, Melanie Crazier, MD  diazepam (VALIUM) 10 MG tablet Take 1 tablet by mouth every 8 (eight) hours as needed (muscle spasms). 05/20/19   [provider]  diltiazem (CARDIZEM CD) 300 MG 24 hr capsule TAKE 1 CAPSULE BY  MOUTH EVERY DAY 05/01/20   Arnoldo Lenis, MD  escitalopram (LEXAPRO) 10 MG tablet Take 10 mg by mouth daily. 04/19/19   [provider]  FARXIGA 10 MG TABS tablet TAKE 1 TABLET BY MOUTH EVERY DAY 11/30/20   Shamleffer, Melanie Crazier, MD  flecainide (TAMBOCOR) 100 MG tablet TAKE 1 TABLET BY MOUTH TWICE A DAY 06/02/20   Evans Lance, MD  furosemide (LASIX) 40 MG tablet Take 40 mg by mouth daily as needed for fluid or edema.    [provider]  Insulin Aspart FlexPen (NOVOLOG) 100 UNIT/ML INJECT 10 UNITS INTO THE SKIN 3 (THREE) TIMES DAILY WITH MEALS. 03/03/21   Shamleffer, Melanie Crazier, MD  Insulin Degludec FlexTouch 100 UNIT/ML SOPN INJECT 30 UNITS INTO THE SKIN DAILY (NEED MEDICARE PART B INFO) 03/03/21   Shamleffer, Melanie Crazier, MD  Insulin Pen Needle 31G X 5 MM MISC 1 Device by Does not apply route in the morning, at noon, in the evening, and at bedtime. 03/02/21   Shamleffer, Melanie Crazier, MD  levothyroxine (SYNTHROID, LEVOTHROID) 112 MCG tablet TAKE 1 TABLET BY MOUTH EVERY MORNING BEFORE BREAKFAST 04/24/17   Cassandria Anger, MD  metFORMIN (GLUCOPHAGE) 1000 MG tablet TAKE 1 TABLET BY MOUTH 2 TIMES DAILY WITH A MEAL. 02/22/21   Shamleffer, Melanie Crazier, MD  Omega-3 Fatty Acids (FISH OIL) 1000 MG CAPS Take 1,000-2,000 mg by mouth See admin instructions. Take 2000mg  in the AM and 1000mg  in the PM    [provider]  oxymetazoline (AFRIN) 0.05 % nasal spray Place 1 spray into both nostrils 2 (two) times daily as needed for congestion. Sinex brand    [provider]  pantoprazole (PROTONIX) 40 MG tablet Take 1 tablet (40 mg total) by mouth daily. 03/01/21   Erenest Rasher, PA-C  Polyethyl Glycol-Propyl Glycol (SYSTANE) 0.4-0.3 % SOLN Place 1 drop into both eyes every 6 (six) hours as needed (dry eyes).    [provider]  propranolol (INDERAL) 20 MG tablet TAKE 1 TABLET BY MOUTH TWICE A DAY 02/23/21   Mahala Menghini, PA-C   rivaroxaban (XARELTO) 20 MG TABS tablet TAKE 1 TABLET BY MOUTH EVERY DAY WITH DINNER 05/09/18   Evans Lance, MD  simvastatin (ZOCOR) 20 MG tablet TAKE 1 TABLET BY MOUTH EVERY DAY IN THE EVENING 06/02/20   Arnoldo Lenis, MD  spironolactone (ALDACTONE) 50 MG tablet TAKE 1 TABLET BY MOUTH TWICE A DAY 02/23/21   Mahala Menghini, PA-C     Family History  Problem Relation Age of Onset   Cancer Mother        Deceased, 30   Ovarian cancer Mother    Arrhythmia Father    Other Father        Deceased 66   Stroke Brother    Stroke Brother    Stroke Sister    Crohn's disease Daughter    Diabetes Maternal Grandmother    Colon cancer Neg Hx     Social History   Socioeconomic History   Marital status: Married    Spouse name: Not on file   Number of children: Not on file   Years of education: Not on file   Highest education level: Not on file  Occupational History   Not on file  Tobacco Use   Smoking status: Every Day    Packs/day: 0.25    Years: 45.00    Pack years: 11.25    Types: Cigarettes    Start date: 04/11/1968   Smokeless tobacco: Never   Tobacco comments:    Quit x 8 months this time  Vaping Use   Vaping Use: Never used  Substance and Sexual Activity   Alcohol use: No    Alcohol/week: 0.0 standard drinks    Comment: "quit alcohol 2011" Previously drinking socially about twice per month   Drug use: No   Sexual activity: Never  Other Topics Concern   Not on file  Social History Narrative   Lives with wife in a one story home.  Has 3 children.     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     Education: some college.   Social Determinants of Health   Financial Resource Strain: Not on file  Food Insecurity: Not on file  Transportation Needs: Not on file  Physical Activity: Not on file  Stress: Not on file  Social Connections: Not on file    ECOG Status: 0 - Asymptomatic  Review of Systems: A 12 point ROS discussed and pertinent positives are indicated in  the  HPI above.  All other systems are negative.   Vital Signs: There were no vitals taken for this visit.  No physical examination was performed in lieu of telephone visit.  Imaging: US abdomen 01/26/21   CT AP 07/30/20  Conventional celiac anatomy.  Labs: 02/18/21 WBC 9.0, Hb 17.9, Hct 53.0, Plt 206 INR 1.1  12/16/20 Na 136, K 5.2, Cl 96, CO2 22, BUN 24, Cr 0.92, Glu 226 Alk Phos 179, Tbili 0.5, AST 33, ALT 53, Albumin 4.6  AFP (08/07/20) - 2.5  Marcello Moores A (5 pts) ALBI Grade 1 (-3.29 pts)   Assessment and Plan: 70 year old male with history of NASH cirrhosis (Child Pugh A, ALBI grade 1) and biopsy proven unifocal hepatocellular carcinoma.  We discussed locoregional treatment options offered by Interventional Radiology including ablative and embolic.    Primarily given the location of his hepatoma with a large portion abutting the hepatic capsule, ablation is much higher risk for complication.  I recommended and explained the indications, contraindications, basic procedural technique, risks, and ultimate benefits of radioembolization with targeted segmentectomy approach.  He is amenable and eager to proceed.  -Plan for Tc-MAA mapping study at [facility] followed approximately 2 weeks later by Y-90 segmentectomy radioembolization  -No need to hold Xarelto for either procedure per [facility] Anticoagulation Guidelines    Thank you for this interesting consult.  I greatly enjoyed meeting RYLYNN SCHONEMAN and look forward to participating in their care.  A copy of this report was sent to the requesting provider on this date.  Electronically Signed: Suzette Battiest, MD 03/03/2021, 12:24 PM   I spent a total of  60 Minutes  in telephone clinical consultation, greater than 50% of which was counseling/coordinating care for hepatocellular carcinoma.

## 2021-03-06 ENCOUNTER — Other Ambulatory Visit: Payer: Self-pay | Admitting: Podiatry

## 2021-03-08 NOTE — Telephone Encounter (Signed)
Please advise 

## 2021-03-09 ENCOUNTER — Ambulatory Visit (INDEPENDENT_AMBULATORY_CARE_PROVIDER_SITE_OTHER): Payer: Medicare HMO

## 2021-03-09 DIAGNOSIS — I495 Sick sinus syndrome: Secondary | ICD-10-CM | POA: Diagnosis not present

## 2021-03-09 DIAGNOSIS — K7581 Nonalcoholic steatohepatitis (NASH): Secondary | ICD-10-CM | POA: Diagnosis not present

## 2021-03-09 DIAGNOSIS — K7469 Other cirrhosis of liver: Secondary | ICD-10-CM | POA: Diagnosis not present

## 2021-03-09 DIAGNOSIS — C220 Liver cell carcinoma: Secondary | ICD-10-CM | POA: Diagnosis not present

## 2021-03-09 LAB — CUP PACEART REMOTE DEVICE CHECK
Battery Remaining Longevity: 12 mo
Battery Remaining Percentage: 11 %
Battery Voltage: 2.75 V
Brady Statistic AP VP Percent: 1 %
Brady Statistic AP VS Percent: 54 %
Brady Statistic AS VP Percent: 1 %
Brady Statistic AS VS Percent: 45 %
Brady Statistic RA Percent Paced: 54 %
Brady Statistic RV Percent Paced: 1 %
Date Time Interrogation Session: 20221129070058
Implantable Lead Implant Date: 20130920
Implantable Lead Implant Date: 20130920
Implantable Lead Location: 753859
Implantable Lead Location: 753860
Implantable Pulse Generator Implant Date: 20130920
Lead Channel Impedance Value: 310 Ohm
Lead Channel Impedance Value: 430 Ohm
Lead Channel Pacing Threshold Amplitude: 0.75 V
Lead Channel Pacing Threshold Amplitude: 1 V
Lead Channel Pacing Threshold Pulse Width: 0.4 ms
Lead Channel Pacing Threshold Pulse Width: 1 ms
Lead Channel Sensing Intrinsic Amplitude: 1.4 mV
Lead Channel Sensing Intrinsic Amplitude: 4.6 mV
Lead Channel Setting Pacing Amplitude: 2 V
Lead Channel Setting Pacing Amplitude: 2.5 V
Lead Channel Setting Pacing Pulse Width: 1 ms
Lead Channel Setting Sensing Sensitivity: 1 mV
Pulse Gen Model: 2210
Pulse Gen Serial Number: 7393982

## 2021-03-15 ENCOUNTER — Ambulatory Visit (HOSPITAL_COMMUNITY)
Admission: RE | Admit: 2021-03-15 | Discharge: 2021-03-15 | Disposition: A | Discharge status: Home / Self Care | Payer: Medicare HMO | Source: Ambulatory Visit | Attending: Internal Medicine | Admitting: Internal Medicine

## 2021-03-15 ENCOUNTER — Other Ambulatory Visit: Payer: Self-pay

## 2021-03-15 DIAGNOSIS — Z01810 Encounter for preprocedural cardiovascular examination: Secondary | ICD-10-CM | POA: Diagnosis not present

## 2021-03-15 DIAGNOSIS — Z01818 Encounter for other preprocedural examination: Secondary | ICD-10-CM | POA: Insufficient documentation

## 2021-03-15 DIAGNOSIS — I2510 Atherosclerotic heart disease of native coronary artery without angina pectoris: Secondary | ICD-10-CM | POA: Insufficient documentation

## 2021-03-15 LAB — NM MYOCAR MULTI W/SPECT W/WALL MOTION / EF
LV dias vol: 102 mL (ref 62–150)
LV sys vol: 37 mL
Nuc Stress EF: 64 %
Peak HR: 67 {beats}/min
RATE: 0.5
Rest HR: 64 {beats}/min
Rest Nuclear Isotope Dose: 9.6 mCi
SDS: 1
SRS: 2
SSS: 3
ST Depression (mm): 0 mm
Stress Nuclear Isotope Dose: 30 mCi
TID: 1.05

## 2021-03-15 MED ORDER — REGADENOSON 0.4 MG/5ML IV SOLN
INTRAVENOUS | Status: AC
  Administered 2021-03-15: 0.4 mg via INTRAVENOUS
  Filled 2021-03-15: qty 5

## 2021-03-15 MED ORDER — TECHNETIUM TC 99M TETROFOSMIN IV KIT
10.0000 | PACK | Freq: Once | INTRAVENOUS | Status: AC | PRN
  Administered 2021-03-15: 9.59 via INTRAVENOUS

## 2021-03-15 MED ORDER — TECHNETIUM TC 99M TETROFOSMIN IV KIT
30.0000 | PACK | Freq: Once | INTRAVENOUS | Status: AC | PRN
  Administered 2021-03-15: 30 via INTRAVENOUS

## 2021-03-15 MED ORDER — SODIUM CHLORIDE FLUSH 0.9 % IV SOLN
INTRAVENOUS | Status: AC
  Administered 2021-03-15: 10 mL via INTRAVENOUS
  Filled 2021-03-15: qty 10

## 2021-03-18 NOTE — Progress Notes (Signed)
Remote pacemaker transmission.   

## 2021-04-06 ENCOUNTER — Other Ambulatory Visit: Payer: Self-pay

## 2021-04-06 ENCOUNTER — Ambulatory Visit (HOSPITAL_COMMUNITY)
Admission: RE | Admit: 2021-04-06 | Discharge: 2021-04-06 | Disposition: A | Discharge status: Home / Self Care | Payer: Medicare HMO | Source: Ambulatory Visit | Attending: Nurse Practitioner | Admitting: Nurse Practitioner

## 2021-04-06 DIAGNOSIS — R918 Other nonspecific abnormal finding of lung field: Secondary | ICD-10-CM | POA: Diagnosis not present

## 2021-04-06 DIAGNOSIS — C220 Liver cell carcinoma: Secondary | ICD-10-CM | POA: Insufficient documentation

## 2021-04-06 DIAGNOSIS — J439 Emphysema, unspecified: Secondary | ICD-10-CM | POA: Diagnosis not present

## 2021-04-06 DIAGNOSIS — I700 Atherosclerosis of aorta: Secondary | ICD-10-CM | POA: Diagnosis not present

## 2021-04-06 LAB — POCT I-STAT CREATININE: Creatinine, Ser: 0.8 mg/dL (ref 0.61–1.24)

## 2021-04-06 MED ORDER — IOHEXOL 300 MG/ML  SOLN
100.0000 mL | Freq: Once | INTRAMUSCULAR | Status: AC | PRN
  Administered 2021-04-06: 13:00:00 75 mL via INTRAVENOUS

## 2021-04-08 DIAGNOSIS — I4891 Unspecified atrial fibrillation: Secondary | ICD-10-CM | POA: Diagnosis not present

## 2021-04-08 DIAGNOSIS — E039 Hypothyroidism, unspecified: Secondary | ICD-10-CM | POA: Diagnosis not present

## 2021-04-08 DIAGNOSIS — I471 Supraventricular tachycardia: Secondary | ICD-10-CM | POA: Diagnosis not present

## 2021-04-08 DIAGNOSIS — I10 Essential (primary) hypertension: Secondary | ICD-10-CM | POA: Diagnosis not present

## 2021-04-08 DIAGNOSIS — J449 Chronic obstructive pulmonary disease, unspecified: Secondary | ICD-10-CM | POA: Diagnosis not present

## 2021-04-08 DIAGNOSIS — E119 Type 2 diabetes mellitus without complications: Secondary | ICD-10-CM | POA: Diagnosis not present

## 2021-04-08 DIAGNOSIS — Z0000 Encounter for general adult medical examination without abnormal findings: Secondary | ICD-10-CM | POA: Diagnosis not present

## 2021-04-08 DIAGNOSIS — E6609 Other obesity due to excess calories: Secondary | ICD-10-CM | POA: Diagnosis not present

## 2021-04-08 DIAGNOSIS — G894 Chronic pain syndrome: Secondary | ICD-10-CM | POA: Diagnosis not present

## 2021-04-14 ENCOUNTER — Ambulatory Visit: Payer: Medicare HMO | Admitting: Podiatry

## 2021-04-14 ENCOUNTER — Other Ambulatory Visit: Payer: Self-pay

## 2021-04-14 ENCOUNTER — Encounter: Payer: Self-pay | Admitting: Podiatry

## 2021-04-14 DIAGNOSIS — M79675 Pain in left toe(s): Secondary | ICD-10-CM | POA: Diagnosis not present

## 2021-04-14 DIAGNOSIS — M79674 Pain in right toe(s): Secondary | ICD-10-CM

## 2021-04-14 DIAGNOSIS — B351 Tinea unguium: Secondary | ICD-10-CM | POA: Diagnosis not present

## 2021-04-14 DIAGNOSIS — E1142 Type 2 diabetes mellitus with diabetic polyneuropathy: Secondary | ICD-10-CM | POA: Diagnosis not present

## 2021-04-14 NOTE — Progress Notes (Signed)
°  Subjective:  Patient ID: Ralph [name], male    DOB: 08-Dec-1950,   MRN: [id number]  Chief Complaint  Patient presents with   Nail Problem    Nail fungus 3 month.    71 y.o. male presents for thickened discolored nails and diabetic foot check. Patient relates his last A1c was 8.2 and was last seen in May by Dr. Hilma Favors  Relates pain in his toenails bilateral. . Denies any other pedal complaints. Denies n/v/f/c.   Past Medical History:  Diagnosis Date   Anginal pain ([place])    Arthritis    "back; fingers" (01/06/2012)   Atrial flutter (HCC)    s/p EPS +RF ablation of typical atrial flutter April 2015   Cancer [facility])    skin cancer   CHB (complete heart block) (HCC)    CHF (congestive heart failure) ([place]) 01/06/2012   Chronic lower back pain    Cirrhosis ([place])    NASH-Hep A and B immune   Coughing up blood    "comes from my throat" (01/06/2012)   DDD (degenerative disc disease), lumbar    Depressed    Difficult intubation    Eschmann stylet used in 2002 and 2007; "trouble waking up afterwards" (01/06/2012)   Emphysema    Fatty liver disease, nonalcoholic    GERD (gastroesophageal reflux disease)    H/O hiatal hernia    Headache    History of esophageal varices    Hypertension    Hypothyroidism    Orthostatic dizziness    Pacemaker    Pneumonia Aug 2016   Presence of permanent cardiac pacemaker 9/292013   [facility]   Sinus pause 01/06/2012   5.2 seconds   Sleep apnea    "don't wear mask" (01/06/2012)   Stroke ([place])    pt states that he might have had a stroke not sure   Type II diabetes mellitus (HCC)    Varicose vein    of esophagus    Objective:  Physical Exam: Vascular: DP/PT pulses 2/4 bilateral. CFT <3 seconds. Normal hair growth on digits. No edema.  Skin. No lacerations or abrasions bilateral feet. Nails 1-5 b/l are thickened discolored and elongated with subungual debris.  Musculoskeletal: MMT 5/5 bilateral lower extremities in DF, PF, Inversion and Eversion.  Deceased ROM in DF of ankle joint. Tender to nails 1-5 b/l Neurological: Sensation intact to light touch.   Assessment:   1. Type 2 diabetes mellitus with diabetic polyneuropathy, without long-term current use of insulin (HCC)   2. Pain due to onychomycosis of toenail of left foot   3. Pain due to onychomycosis of toenail of right foot       Plan:  Patient was evaluated and treated and all questions answered. -Discussed and educated patient on diabetic foot care, especially with  regards to the vascular, neurological and musculoskeletal systems.  -Stressed the importance of good glycemic control and the detriment of not  controlling glucose levels in relation to the foot. -Discussed supportive shoes at all times and checking feet regularly.  -Mechanically debrided all nails 1-5 bilateral using sterile nail nipper and filed with dremel without incident  -Answered all patient questions -Patient to return  in 3 months for at risk foot care -Patient advised to call the office if any problems or questions arise in the meantime.   Lorenda Peck, DPM

## 2021-04-20 ENCOUNTER — Encounter: Payer: Self-pay | Admitting: Internal Medicine

## 2021-04-20 ENCOUNTER — Other Ambulatory Visit (HOSPITAL_COMMUNITY): Payer: Self-pay | Admitting: Interventional Radiology

## 2021-04-20 DIAGNOSIS — C220 Liver cell carcinoma: Secondary | ICD-10-CM

## 2021-04-22 NOTE — Telephone Encounter (Signed)
Called IR, he will be having Y-90 and they're awaiting authorization from his insurance company.

## 2021-04-28 ENCOUNTER — Other Ambulatory Visit: Payer: Self-pay | Admitting: Radiology

## 2021-04-29 ENCOUNTER — Other Ambulatory Visit (HOSPITAL_COMMUNITY): Payer: Self-pay | Admitting: Physician Assistant

## 2021-04-29 ENCOUNTER — Other Ambulatory Visit: Payer: Self-pay | Admitting: Radiology

## 2021-04-30 ENCOUNTER — Encounter (HOSPITAL_COMMUNITY)
Admission: RE | Admit: 2021-04-30 | Discharge: 2021-04-30 | Disposition: A | Discharge status: Home / Self Care | Payer: Medicare HMO | Source: Ambulatory Visit | Attending: Interventional Radiology | Admitting: Interventional Radiology

## 2021-04-30 ENCOUNTER — Other Ambulatory Visit (HOSPITAL_COMMUNITY): Payer: Self-pay | Admitting: Interventional Radiology

## 2021-04-30 ENCOUNTER — Ambulatory Visit (HOSPITAL_COMMUNITY)
Admission: RE | Admit: 2021-04-30 | Discharge: 2021-04-30 | Disposition: A | Discharge status: Home / Self Care | Payer: Medicare HMO | Source: Ambulatory Visit | Attending: Interventional Radiology | Admitting: Interventional Radiology

## 2021-04-30 ENCOUNTER — Encounter (HOSPITAL_COMMUNITY): Payer: Self-pay

## 2021-04-30 ENCOUNTER — Other Ambulatory Visit: Payer: Self-pay

## 2021-04-30 DIAGNOSIS — C220 Liver cell carcinoma: Secondary | ICD-10-CM

## 2021-04-30 DIAGNOSIS — Z950 Presence of cardiac pacemaker: Secondary | ICD-10-CM | POA: Diagnosis not present

## 2021-04-30 DIAGNOSIS — E119 Type 2 diabetes mellitus without complications: Secondary | ICD-10-CM | POA: Diagnosis not present

## 2021-04-30 DIAGNOSIS — I509 Heart failure, unspecified: Secondary | ICD-10-CM | POA: Diagnosis not present

## 2021-04-30 DIAGNOSIS — G4733 Obstructive sleep apnea (adult) (pediatric): Secondary | ICD-10-CM | POA: Diagnosis not present

## 2021-04-30 DIAGNOSIS — K7460 Unspecified cirrhosis of liver: Secondary | ICD-10-CM | POA: Diagnosis not present

## 2021-04-30 DIAGNOSIS — I110 Hypertensive heart disease with heart failure: Secondary | ICD-10-CM | POA: Diagnosis not present

## 2021-04-30 DIAGNOSIS — J449 Chronic obstructive pulmonary disease, unspecified: Secondary | ICD-10-CM | POA: Diagnosis not present

## 2021-04-30 DIAGNOSIS — K7581 Nonalcoholic steatohepatitis (NASH): Secondary | ICD-10-CM | POA: Diagnosis not present

## 2021-04-30 HISTORY — PX: IR EMBO ARTERIAL NOT HEMORR HEMANG INC GUIDE ROADMAPPING: IMG5448

## 2021-04-30 HISTORY — PX: IR US GUIDE VASC ACCESS RIGHT: IMG2390

## 2021-04-30 HISTORY — PX: IR ANGIOGRAM VISCERAL SELECTIVE: IMG657

## 2021-04-30 HISTORY — PX: IR ANGIOGRAM SELECTIVE EACH ADDITIONAL VESSEL: IMG667

## 2021-04-30 LAB — CBC WITH DIFFERENTIAL/PLATELET
Abs Immature Granulocytes: 0.03 10*3/uL (ref 0.00–0.07)
Basophils Absolute: 0.1 10*3/uL (ref 0.0–0.1)
Basophils Relative: 1 %
Eosinophils Absolute: 0.2 10*3/uL (ref 0.0–0.5)
Eosinophils Relative: 2 %
HCT: 51.9 % (ref 39.0–52.0)
Hemoglobin: 17.5 g/dL — ABNORMAL HIGH (ref 13.0–17.0)
Immature Granulocytes: 0 %
Lymphocytes Relative: 15 %
Lymphs Abs: 1.3 10*3/uL (ref 0.7–4.0)
MCH: 33.5 pg (ref 26.0–34.0)
MCHC: 33.7 g/dL (ref 30.0–36.0)
MCV: 99.4 fL (ref 80.0–100.0)
Monocytes Absolute: 0.6 10*3/uL (ref 0.1–1.0)
Monocytes Relative: 7 %
Neutro Abs: 6.7 10*3/uL (ref 1.7–7.7)
Neutrophils Relative %: 75 %
Platelets: 181 10*3/uL (ref 150–400)
RBC: 5.22 MIL/uL (ref 4.22–5.81)
RDW: 13.3 % (ref 11.5–15.5)
WBC: 8.9 10*3/uL (ref 4.0–10.5)
nRBC: 0 % (ref 0.0–0.2)

## 2021-04-30 LAB — COMPREHENSIVE METABOLIC PANEL
ALT: 46 U/L — ABNORMAL HIGH (ref 0–44)
AST: 41 U/L (ref 15–41)
Albumin: 4.4 g/dL (ref 3.5–5.0)
Alkaline Phosphatase: 122 U/L (ref 38–126)
Anion gap: 10 (ref 5–15)
BUN: 30 mg/dL — ABNORMAL HIGH (ref 8–23)
CO2: 24 mmol/L (ref 22–32)
Calcium: 8.9 mg/dL (ref 8.9–10.3)
Chloride: 102 mmol/L (ref 98–111)
Creatinine, Ser: 0.78 mg/dL (ref 0.61–1.24)
GFR, Estimated: >60 mL/min (ref >60)
Glucose, Bld: 170 mg/dL — ABNORMAL HIGH (ref 70–99)
Potassium: 5.9 mmol/L — ABNORMAL HIGH (ref 3.5–5.1)
Sodium: 136 mmol/L (ref 135–145)
Total Bilirubin: 1.4 mg/dL — ABNORMAL HIGH (ref 0.3–1.2)
Total Protein: 8.1 g/dL (ref 6.5–8.1)

## 2021-04-30 LAB — GLUCOSE, CAPILLARY: Glucose-Capillary: 168 mg/dL — ABNORMAL HIGH (ref 70–99)

## 2021-04-30 LAB — PROTIME-INR
INR: 1.4 — ABNORMAL HIGH (ref 0.8–1.2)
Prothrombin Time: 17 seconds — ABNORMAL HIGH (ref 11.4–15.2)

## 2021-04-30 MED ORDER — MIDAZOLAM HCL 2 MG/2ML IJ SOLN
INTRAMUSCULAR | Status: AC
  Filled 2021-04-30: qty 2

## 2021-04-30 MED ORDER — SODIUM CHLORIDE 0.9 % IV SOLN: INTRAVENOUS | Status: DC

## 2021-04-30 MED ORDER — MIDAZOLAM HCL 2 MG/2ML IJ SOLN
INTRAMUSCULAR | Status: AC | PRN
  Administered 2021-04-30: .5 mg via INTRAVENOUS

## 2021-04-30 MED ORDER — FENTANYL CITRATE (PF) 100 MCG/2ML IJ SOLN
INTRAMUSCULAR | Status: AC | PRN
Start: 2021-04-30 — End: 2021-04-30
  Administered 2021-04-30: 50 ug via INTRAVENOUS

## 2021-04-30 MED ORDER — TECHNETIUM TO 99M ALBUMIN AGGREGATED
4.3000 | Freq: Once | INTRAVENOUS | Status: AC
  Administered 2021-04-30: 4.3 via INTRAVENOUS

## 2021-04-30 MED ORDER — MIDAZOLAM HCL 2 MG/2ML IJ SOLN
INTRAMUSCULAR | Status: AC | PRN
  Administered 2021-04-30: 1 mg via INTRAVENOUS

## 2021-04-30 MED ORDER — FENTANYL CITRATE (PF) 100 MCG/2ML IJ SOLN
INTRAMUSCULAR | Status: AC
  Filled 2021-04-30: qty 2

## 2021-04-30 MED ORDER — FENTANYL CITRATE (PF) 100 MCG/2ML IJ SOLN
INTRAMUSCULAR | Status: AC | PRN
  Administered 2021-04-30: 25 ug via INTRAVENOUS

## 2021-04-30 MED ORDER — IOHEXOL 300 MG/ML  SOLN
100.0000 mL | Freq: Once | INTRAMUSCULAR | Status: AC | PRN
  Administered 2021-04-30: 50 mL via INTRA_ARTERIAL

## 2021-04-30 MED ORDER — LIDOCAINE HCL (PF) 1 % IJ SOLN
INTRAMUSCULAR | Status: AC | PRN
  Administered 2021-04-30: 10 mL via INTRADERMAL

## 2021-04-30 MED ORDER — LIDOCAINE HCL 1 % IJ SOLN
INTRAMUSCULAR | Status: AC
  Administered 2021-04-30: 10 mL
  Filled 2021-04-30: qty 20

## 2021-04-30 NOTE — Procedures (Signed)
Interventional Radiology Procedure Note  Procedure:  1) Hepatic angiography 2) Tc-MAA injection to left segment 4B accessory artery  Findings: Please refer to procedural dictation for full description. 6 Fr right CFA Angioseal closure.  Complications: None immediate  Estimated Blood Loss: < 5 mL  Recommendations: Strict 4 hour bedrest.  Head of bed flat for 2 hours (until 13:45), up to 30 degrees for 2 hours (until 15:45). Follow up in 2 weeks for left hepatic Y90 radiation segmentectomy.    Ruthann Cancer, MD Pager: [phone]

## 2021-04-30 NOTE — Sedation Documentation (Signed)
Writer transported patient to MI via stretcher in [place].

## 2021-04-30 NOTE — Discharge Instructions (Signed)
Please call Interventional Radiology clinic [phone] with any questions or concerns.  You may remove your dressing and shower tomorrow.    Hepatic Artery Radioembolization, Care After The following information offers guidance on how to care for yourself after your procedure. Your health care provider may also give you more specific instructions. If you have problems or questions, contact your health care provider. What can I expect after the procedure? After the procedure, it is possible to have: A slight fever for 7 to 10 days. This may be accompanied by pain, nausea, or vomiting, which is referred to as post-embolization syndrome. You may be given medicine to help relieve these symptoms. If your fever gets worse, tell your health care provider. Tiredness (fatigue). Loss of appetite. This should gradually improve after about 1 week. Abdominal pain on your right side. Soreness and tenderness in your groin area where the needle and catheter were placed (puncture site). Follow these instructions at home: Puncture site care Follow instructions from your health care provider about how to take care of the puncture site. Make sure you: Wash your hands with soap and water for at least 20 seconds before and after you change your bandage (dressing). If soap and water are not available, use hand sanitizer. Change your dressing as told by your health care provider. Check your puncture site every day for signs of infection. Check for: More redness, swelling, or pain. Fluid or blood. Warmth. Pus or a bad smell. Activity Rest as told by your health care provider. Return to your normal activities as told by your health care provider. Ask your health care provider what activities are safe for you. Avoid sitting for a long time without moving. Get up to take short walks every 1-2 hours. This is important to improve blood flow and breathing. Ask for help if you feel weak or unsteady. If you were given  a sedative during the procedure, it can affect you for several hours. Do not drive or operate machinery until your health care provider says that it is safe. Do not lift anything that is heavier than 10 lb (4.5 kg), or the limit that you are told, until your health care provider says that it is safe. Medicines Take over-the-counter and prescription medicines only as told by your health care provider. Ask your health care provider if the medicine prescribed to you: Requires you to avoid driving or using machinery. Can cause constipation. You may need to take these actions to prevent or treat constipation: Drink enough fluid to keep your urine pale yellow. Take over-the-counter or prescription medicines. Eat foods that are high in fiber, such as beans, whole grains, and fresh fruits and vegetables. Limit foods that are high in fat and processed sugars, such as fried or sweet foods. General instructions Eat frequent, small meals until your appetite returns. Follow instructions from your health care provider about eating or drinking restrictions. Do not take baths, swim, or use a hot tub until your health care provider approves. You may take showers. Wash your puncture site with mild soap and water, and pat the area dry. Wear compression stockings as told by your health care provider. These stockings help to prevent blood clots and reduce swelling in your legs. Keep all follow-up visits. This is important. You may need to have blood tests and imaging tests. Contact a health care provider if: You have any of these signs of infection: More redness, swelling, or pain around your puncture site. Fluid or blood coming from your  puncture site. Warmth coming from your puncture site. Pus or a bad smell coming from your puncture site. You have pain that: Gets worse. Does not get better with medicine. Feels like very bad heartburn. Is in the middle of your abdomen, above your belly button. You have any  signs of infection or liver failure, such as: Your skin or the white parts of your eyes turn yellow (jaundice). The color of your urine changes to dark brown. The color of your stool (feces) changes to light yellow. Your abdominal measurement (girth) increases in a short period of time. You gain more than 5 lb (2.3 kg) in a short period of time. Get help right away if: You have a fever that lasts more than 10 days or is higher than what your health care provider told you to expect. You develop any of the following in your legs: Pain. Swelling. Skin that is cold or pale or turns blue. You have chest pain. You have blood in your vomit, saliva, or stool. You have trouble breathing. These symptoms may represent a serious problem that is an emergency. Do not wait to see if the symptoms will go away. Get medical help right away. Call your local emergency services (911 in the [place] Do not drive yourself to the hospital. Summary After the procedure, it is possible to have a slight fever for up to 7-10 days, tiredness, loss of appetite, abdominal pain on the right side, and groin tenderness where the catheter was placed. Do not come in close contact with people for up to a week after your procedure, as told by your health care provider. Follow instructions from your health care provider about how to take care of the puncture site. Contact a health care provider if you have any signs of infection. Get help right away if you develop pain or swelling in your legs or if your legs feel cool or look pale. This information is not intended to replace advice given to you by your health care provider. Make sure you discuss any questions you have with your health care provider. Document Revised: 03/01/2020 Document Reviewed: 03/01/2020 Elsevier Patient Education  2022 [place].      Moderate Conscious Sedation, Adult, Care After This sheet gives you information about how to care for yourself after  your procedure. Your health care provider may also give you more specific instructions. If you have problems or questions, contact your health care provider. What can I expect after the procedure? After the procedure, it is common to have: Sleepiness for several hours. Impaired judgment for several hours. Difficulty with balance. Vomiting if you eat too soon. Follow these instructions at home: For the time period you were told by your health care provider: Rest. Do not participate in activities where you could fall or become injured. Do not drive or use machinery. Do not drink alcohol. Do not take sleeping pills or medicines that cause drowsiness. Do not make important decisions or sign legal documents. Do not take care of children on your own.      Eating and drinking Follow the diet recommended by your health care provider. Drink enough fluid to keep your urine pale yellow. If you vomit: Drink water, juice, or soup when you can drink without vomiting. Make sure you have little or no nausea before eating solid foods.   General instructions Take over-the-counter and prescription medicines only as told by your health care provider. Have a responsible adult stay with you for the time  you are told. It is important to have someone help care for you until you are awake and alert. Do not smoke. Keep all follow-up visits as told by your health care provider. This is important. Contact a health care provider if: You are still sleepy or having trouble with balance after 24 hours. You feel light-headed. You keep feeling nauseous or you keep vomiting. You develop a rash. You have a fever. You have redness or swelling around the IV site. Get help right away if: You have trouble breathing. You have new-onset confusion at home. Summary After the procedure, it is common to feel sleepy, have impaired judgment, or feel nauseous if you eat too soon. Rest after you get home. Know the things you  should not do after the procedure. Follow the diet recommended by your health care provider and drink enough fluid to keep your urine pale yellow. Get help right away if you have trouble breathing or new-onset confusion at home. This information is not intended to replace advice given to you by your health care provider. Make sure you discuss any questions you have with your health care provider. Document Revised: 07/26/2019 Document Reviewed: 02/21/2019 Elsevier Patient Education  2021 [place] YOUR NEXT PROCEDURE  Radiation precautions For up to a week after your procedure, there will be a small amount of radioactivity near your liver. This is not especially dangerous to other people. However, as told by your health care provider, you should follow these precautions for 7 days: Do not come in close contact with people. Do not sleep in the same bed as someone else. Do not hold children or babies. Do not have contact with pregnant women.  Post Y-90 Radioembolization Discharge Instructions  You have been given a radioactive material during your procedure.  While it is safe for you to be discharged home from the hospital, you need to proceed directly home.    Do not use public transportation, including air travel, lasting more than 2 hours for 1 week.  Avoid crowded public places for 1 week.  Adult visitors should try to avoid close contact with you for 1 week.    Children and pregnant females should not visit or have close contact with you for 1 week.  Items that you touch are not radioactive.  Do not sleep in the same bed as your partner for 1 week, and a condom should be used for sexual activity during the first 24 hours.  Your blood may be radioactive and caution should be used if any bleeding occurs during the recovery period.  Body fluids may be radioactive for 24 hours.  Wash your hands after voiding.  Men should sit to urinate.  Dispose of any soiled materials  (flush down toilet or place in trash at home) during the first day.  Drink 6 to 8 glasses of fluids per day for 5 days to hydrate yourself.  If you need to see a doctor during the first week, you must let them know that you were treated with yttrium-90 microspheres, and will be slightly radioactive.  They can call Interventional Radiology [phone] with any questions.

## 2021-04-30 NOTE — H&P (Signed)
Referring Physician(s): Drazek,D  Supervising Physician: Ruthann Cancer  Patient Status:  [facility] OP  Chief Complaint: Hepatocellular carcinoma   Subjective: Patient familiar to IR service from  left liver mass biopsy on 02/18/2021 and consultation with Dr. Serafina Royals on 03/03/2021 to discuss treatment options for hepatocellular carcinoma. He is s a 71 y.o. male smoker with history of NASH cirrhosis and multiple comorbidities  including CHF with pacemaker, atrial flutter with prior ablation, depression, upper thyroidism, LEEP apnea, COPD, diabetes, and hypertension. He was noted to have a left hepatic mass on screening ultrasound on 07/01/20 which prompted a multiphase CT on which the mass was not described but in retrospect was subtly present.  This ultimately prompted ultrasound guided liver mass biopsy on 02/18/21 with pathologic confirmation of moderately differentiated hepatocellular carcinoma.  In discussions with Dr. Serafina Royals patient was deemed an appropriate candidate for Y 90 hepatic radioembolization.  He presents today for pre-Y 90  hepatic/visceral arteriogram with embolization and test Y 90 dosing. He currently denies fever, HA,CP, dyspnea, back pain, N/V or bleeding. He does have occ cough, upper abd discomfort.   Past Medical History:  Diagnosis Date   Anginal pain ([place])    Arthritis    "back; fingers" (01/06/2012)   Atrial flutter (H[facility])    s/p EPS +RF ablation of typical atrial flutter April 2015   Cancer [facility])    skin cancer   CHB (complete heart block) (HCC)    CHF (congestive heart failure) ([place]) 01/06/2012   Chronic lower back pain    Cirrhosis ([place])    NASH-Hep A and B immune   Coughing up blood    "comes from my throat" (01/06/2012)   DDD (degenerative disc disease), lumbar    Depressed    Difficult intubation    Eschmann stylet used in 2002 and 2007; "trouble waking up afterwards" (01/06/2012)   Emphysema    Fatty liver disease, nonalcoholic    GERD (gastroesophageal  reflux disease)    H/O hiatal hernia    Headache    History of esophageal varices    Hypertension    Hypothyroidism    Orthostatic dizziness    Pacemaker    Pneumonia Aug 2016   Presence of permanent cardiac pacemaker 9/292013   [place]   Sinus pause 01/06/2012   5.2 seconds   Sleep apnea    "don't wear mask" (01/06/2012)   Stroke [facility])    pt states that he might have had a stroke not sure   Type II diabetes mellitus ([place])    Varicose vein    of esophagus   Past Surgical History:  Procedure Laterality Date   ATRIAL FLUTTER ABLATION N/A 07/10/2013   Procedure: ATRIAL FLUTTER ABLATION;  Surgeon: Evans Lance, MD;  Location[facility] CATH LAB;  Service: Cardiovascular;  Laterality: N/A;   BACK SURGERY     CHOLECYSTECTOMY  1993   COLONOSCOPY  11/08/2004   ONG:EXBMWU rectum, colon, TI.   COLONOSCOPY N/A 05/28/2014   Dr. Gala Romney: Redundant colon. single colonic polyp removed as described above. Tubular adenoma   ESOPHAGEAL DILATION N/A 05/28/2014   Procedure: ESOPHAGEAL DILATION;  Surgeon: Daneil Dolin, MD;  Location: [facility] ENDO SUITE;  Service: Endoscopy;  Laterality: N/A;   ESOPHAGOGASTRODUODENOSCOPY  11/08/2004   XLK:GMWNUU esophageal erosions consistent with erosive reflux esophagitis/Areas of hemorrhage and nodularity of the fundal mucosa of uncertain significance, biopsied.  Small hiatal hernia, otherwise normal stomach   ESOPHAGOGASTRODUODENOSCOPY  2010   Dr. Gala Romney: 3 columns Grade 1 varices,  erosive esophagitis, HH, portal gastropathy, normal D1, D2   ESOPHAGOGASTRODUODENOSCOPY N/A 05/28/2014   Dr. Gala Romney: MIld erosive reflux esophagitis. Grade 1 esophageal varices. Patent esophagus. No dilation performed. Hiatal hernia.    ESOPHAGOGASTRODUODENOSCOPY (EGD) WITH ESOPHAGEAL DILATION N/A 02/14/2013   GYF:VCBSW 1 esophageal varices. Abnormal distal esophagus/status post biopsy after [place]oney dilation. Portal gastropathy. Antral erosions-status post biopsy. path negative for H.pylori, benign  path.   IR RADIOLOGIST EVAL & MGMT  03/03/2021   LUMBAR [place] SURGERY  1994; ~ 1995; ~ [place]   nuclear stress test  10/19/2004   No ischemia   PERMANENT PACEMAKER INSERTION  01/08/2012   CHB   PERMANENT PACEMAKER INSERTION N/A 01/09/2012   Procedure: PERMANENT PACEMAKER INSERTION;  Surgeon: Sanda Klein, MD;  Location: [facility] CATH LAB;  Service: Cardiovascular;  Laterality: N/A;   POSTERIOR FUSION LUMBAR SPINE  1999   L4-5   SPINAL CORD STIMULATOR IMPLANT  2006   SPINAL CORD STIMULATOR REMOVAL N/A 01/27/2015   Procedure: LUMBAR SPINAL CORD STIMULATOR REMOVAL;  Surgeon: Kristeen Miss, MD;  Location: [facility] NEURO ORS;  Service: Neurosurgery;  Laterality: N/A;  LUMBAR SPINAL CORD STIMULATOR REMOVAL   TONSILLECTOMY AND ADENOIDECTOMY  1992   US ECHOCARDIOGRAPHY  12/28/2011   mild LVH,mild mitral annulara ca+,mild MR   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 Authorizing Provider  ACCU-CHEK GUIDE test strip TEST TWICE A DAY BEFORE MEALS 01/13/20  Yes Shamleffer, Melanie Crazier, MD  Blood Glucose Monitoring Suppl (ACCU-CHEK AVIVA) device by Other route. Use as instructed to check blood sugar 2 times daily   Yes [provider]  ciclopirox (PENLAC) 8 % solution APPLY TOPICALLY AT BEDTIME. APPLY OVER NAIL AND SURROUNDING SKIN. APPLY DAILY OVER PREVIOUS COAT. AFTER SEVEN (7) DAYS, MAY REMOVE WITH ALCOHOL AND CONTINUE CYCLE. 03/08/21  Yes Lorenda Peck, MD  Continuous Blood Gluc Receiver (DEXCOM G6 RECEIVER) DEVI Use as instruct to check blood sugar daily 05/18/20  Yes Shamleffer, Melanie Crazier, MD  Continuous Blood Gluc Sensor (DEXCOM G6 SENSOR) MISC 1 Device by Does not apply route as directed. 04/30/20  Yes Shamleffer, Melanie Crazier, MD  Continuous Blood Gluc Transmit (DEXCOM G6 TRANSMITTER) MISC 1 Device by Does not apply route as directed.  04/30/20  Yes Shamleffer, Melanie Crazier, MD  diazepam (VALIUM) 10 MG tablet Take 1 tablet by mouth every 8 (eight) hours as needed (muscle spasms). 05/20/19  Yes [provider]  diltiazem (CARDIZEM CD) 300 MG 24 hr capsule TAKE 1 CAPSULE BY MOUTH EVERY DAY 05/01/20  Yes Branch, Alphonse Guild, MD  escitalopram (LEXAPRO) 10 MG tablet Take 10 mg by mouth daily. 04/19/19  Yes [provider]  FARXIGA 10 MG TABS tablet TAKE 1 TABLET BY MOUTH EVERY DAY 11/30/20  Yes Shamleffer, Melanie Crazier, MD  flecainide (TAMBOCOR) 100 MG tablet TAKE 1 TABLET BY MOUTH TWICE A DAY 06/02/20  Yes Evans Lance, MD  furosemide (LASIX) 40 MG tablet Take 40 mg by mouth daily as needed for fluid or edema.   Yes [provider]  Insulin Aspart FlexPen (NOVOLOG) 100 UNIT/ML INJECT 10 UNITS INTO THE SKIN 3 (THREE) TIMES DAILY WITH MEALS. 03/03/21  Yes Shamleffer, Melanie Crazier, MD  Insulin Degludec FlexTouch 100 UNIT/ML SOPN INJECT 30 UNITS INTO THE SKIN DAILY (NEED MEDICARE PART B INFO) 03/03/21  Yes Shamleffer, Melanie Crazier, MD  Insulin Pen Needle 31G X 5 MM MISC 1 Device by Does not apply route in the morning, at noon, in the evening, and at bedtime. 03/02/21  Yes Shamleffer, Melanie Crazier, MD  levothyroxine (SYNTHROID, LEVOTHROID) 112 MCG tablet TAKE 1 TABLET BY MOUTH EVERY MORNING BEFORE BREAKFAST 04/24/17  Yes Nida, Marella Chimes, MD  metFORMIN (GLUCOPHAGE) 1000 MG tablet TAKE 1 TABLET BY MOUTH 2 TIMES DAILY WITH A MEAL. 02/22/21  Yes Shamleffer, Melanie Crazier, MD  Omega-3 Fatty Acids (FISH OIL) 1000 MG CAPS Take 1,000-2,000 mg by mouth See admin instructions. Take 2000mg  in the AM and 1000mg  in the PM   Yes [provider]  pantoprazole (PROTONIX) 40 MG tablet Take 1 tablet (40 mg total) by mouth daily. 03/01/21  Yes Erenest Rasher, PA-C  Polyethyl Glycol-Propyl Glycol (SYSTANE) 0.4-0.3 % SOLN Place 1 drop into both eyes every 6 (six) hours as needed (dry eyes).   Yes [provider]  propranolol (INDERAL) 20 MG tablet TAKE 1 TABLET BY MOUTH TWICE A DAY 02/23/21  Yes Mahala Menghini, PA-C  rivaroxaban (XARELTO) 20 MG TABS tablet TAKE 1 TABLET BY MOUTH EVERY DAY WITH DINNER 05/09/18  Yes Evans Lance, MD  simvastatin (ZOCOR) 20 MG tablet TAKE 1 TABLET BY MOUTH EVERY DAY IN THE EVENING 06/02/20  Yes Arnoldo Lenis, MD  spironolactone (ALDACTONE) 50 MG tablet TAKE 1 TABLET BY MOUTH TWICE A DAY 02/23/21  Yes Mahala Menghini, PA-C  oxymetazoline (AFRIN) 0.05 % nasal spray Place 1 spray into both nostrils 2 (two) times daily as needed for congestion. Sinex brand    [provider]     Vital Signs: BP 120/60    Pulse 60    Temp 98.1 F (36.7 C) (Oral)    Resp 18    Ht 5\' 10"  (1.778 m)    Wt 230 lb (104.3 kg)    SpO2 96%    BMI 33.00 kg/m   Physical Exam awake/alert; chest- distant BS bilat; heart- RRR, left chest wall pacer; abd- protuberant,+BS, mild tenderness epigastric region; no sig LE edema  Imaging: No results found.  Labs:  CBC: Recent Labs    07/01/20 1045 12/16/20 0826 02/18/21 0630 04/30/21 0802  WBC 6.4 7.5 9.0 8.9  HGB 17.3* 17.9* 17.9* 17.5*  HCT 52.1* 50.4 53.0* 51.9  PLT 169 183 206 181    COAGS: Recent Labs    07/01/20 1045 02/18/21 0630  INR 1.3* 1.1    BMP: Recent Labs    07/30/20 1630 12/16/20 0826 04/06/21 1307  NA  --  136  --   K  --  5.2  --   CL  --  96  --   CO2  --  22  --   GLUCOSE  --  226*  --   BUN  --  24  --   CALCIUM  --  9.4  --   CREATININE 0.80 0.92 0.80    LIVER FUNCTION TESTS: Recent Labs    12/16/20 0826  BILITOT 0.5  AST 33  ALT 53*  ALKPHOS 179*  PROT 7.2  ALBUMIN 4.6    Assessment and Plan: Patient familiar to IR service from  left liver mass biopsy on 02/18/2021 and consultation with Dr. Serafina Royals on 03/03/2021 to discuss treatment options for hepatocellular carcinoma. He is s a 71 y.o. male smoker with history of NASH cirrhosis and multiple comorbidities   including CHF with pacemaker, atrial flutter with prior ablation, depression, upper thyroidism,  LEEP apnea, COPD, diabetes, and hypertension. He was noted to have a left hepatic mass on screening ultrasound on 07/01/20 which prompted a multiphase CT on which the mass was not described but in retrospect was subtly present.  This ultimately prompted ultrasound guided liver mass biopsy on 02/18/21 with pathologic confirmation of moderately differentiated hepatocellular carcinoma.  In discussions with Dr. Serafina Royals patient was deemed an appropriate candidate for Y 90 hepatic radioembolization.  He presents today for pre-Y 90  hepatic/visceral arteriogram with embolization and test Y 90 dosing.Risks and benefits of procedure were discussed with the patient including, but not limited to bleeding, infection, vascular injury or contrast induced renal failure.  This interventional procedure involves the use of X-rays and because of the nature of the planned procedure, it is possible that we will have prolonged use of X-ray fluoroscopy.  Potential radiation risks to you include (but are not limited to) the following: - A slightly elevated risk for cancer  several years later in life. This risk is typically less than 0.5% percent. This risk is low in comparison to the normal incidence of human cancer, which is 33% for women and 50% for men according to the [place]. - Radiation induced injury can include skin redness, resembling a rash, tissue breakdown / ulcers and hair loss (which can be temporary or permanent).   The likelihood of either of these occurring depends on the difficulty of the procedure and whether you are sensitive to radiation due to previous procedures, disease, or genetic conditions.   IF your procedure requires a prolonged use of radiation, you will be notified and given written instructions for further action.  It is your responsibility to monitor the irradiated area for the 2 weeks  following the procedure and to notify your physician if you are concerned that you have suffered a radiation induced injury.    All of the patient's questions were answered, patient is agreeable to proceed.  Consent signed and in chart.      Electronically Signed: D. Rowe Robert, PA-C 04/30/2021, 8:23 AM   I spent a total of 25 minutes at the the patient's bedside AND on the patient's hospital floor or unit, greater than 50% of which was counseling/coordinating care for hepatic/visceral arteriogram with embolization/test Y 90 dosing

## 2021-04-30 NOTE — Sedation Documentation (Signed)
Patient is resting comfortably with eyes closed, snoring lightly, in NAD. 

## 2021-05-03 ENCOUNTER — Telehealth (INDEPENDENT_AMBULATORY_CARE_PROVIDER_SITE_OTHER): Payer: Medicare HMO | Admitting: Internal Medicine

## 2021-05-03 ENCOUNTER — Other Ambulatory Visit: Payer: Self-pay

## 2021-05-03 ENCOUNTER — Encounter: Payer: Self-pay | Admitting: Internal Medicine

## 2021-05-03 VITALS — BP 144/67 | HR 67 | Ht 70.0 in

## 2021-05-03 DIAGNOSIS — Z794 Long term (current) use of insulin: Secondary | ICD-10-CM

## 2021-05-03 DIAGNOSIS — E1142 Type 2 diabetes mellitus with diabetic polyneuropathy: Secondary | ICD-10-CM

## 2021-05-03 DIAGNOSIS — E1165 Type 2 diabetes mellitus with hyperglycemia: Secondary | ICD-10-CM | POA: Diagnosis not present

## 2021-05-03 MED ORDER — INSULIN DEGLUDEC FLEXTOUCH 100 UNIT/ML ~~LOC~~ SOPN: 32.0000 [IU] | PEN_INJECTOR | Freq: Every day | SUBCUTANEOUS | 3 refills | Status: AC

## 2021-05-03 MED ORDER — INSULIN ASPART FLEXPEN 100 UNIT/ML ~~LOC~~ SOPN: PEN_INJECTOR | SUBCUTANEOUS | 4 refills | Status: AC

## 2021-05-03 MED ORDER — INSULIN PEN NEEDLE 31G X 5 MM MISC: 1.0000 | Freq: Four times a day (QID) | 3 refills | Status: AC

## 2021-05-03 MED ORDER — DAPAGLIFLOZIN PROPANEDIOL 10 MG PO TABS: 10.0000 mg | ORAL_TABLET | Freq: Every day | ORAL | 3 refills | Status: AC

## 2021-05-03 MED ORDER — METFORMIN HCL 1000 MG PO TABS: 1000.0000 mg | ORAL_TABLET | Freq: Two times a day (BID) | ORAL | 3 refills | Status: AC

## 2021-05-03 NOTE — Progress Notes (Signed)
Virtual Visit via Video Note  I connected with Ralph [name] on 05/03/21  at 10:10 AM , we intially attempted video but the sound was not working and switched to telephone enabled telemedicine application and verified that I am speaking with the correct person using two identifiers.   I discussed the limitations of evaluation and management by telemedicine and the availability of in person appointments. The patient expressed understanding and agreed to proceed.   -Location of the patient : home  -Location of the provider : Office -The names of all persons participating in the telemedicine service : Pt and myself          Name: Ralph [name]  Age/ Sex: 71 y.o., male   MRN/ DOB: [id number], 1950/12/29     PCP: Sharilyn Sites, MD   Reason for Endocrinology Evaluation: Type 2 Diabetes Mellitus  Initial Endocrine Consultative Visit: 04/18/2018    PATIENT IDENTIFIER: Ralph [name] is a 71 y.o. male with a past medical history of A.Fib (S/P Pacemaker), CHF, HTN, Hypothyroidism and T2DM and hepatocellular carcinoma (dx 02/2021) . The patient has followed with Endocrinology clinic since 04/18/2017 for consultative assistance with management of his diabetes.  DIABETIC HISTORY:  Ralph [name] was diagnosed with T2DM in ~ 2009, he has been on oral glycemic agents for years, initially was only on metformin followed by farxiga and levemir added ~ 6 yrs ago but was unable to afford it. His hemoglobin A1c has ranged from 5.7% in 12/2015, peaking at 7.5% in 06/2015.  Soliqua changed to Insulin mix 12/2019, pt noted improvement in chronic nausea with discontinuation of Soliqua    Switched insulin mix to  MDI regimen after Dx of hepatocellular carcinoma 02/2021  SUBJECTIVE:   During the last visit (10/29/2020): A1c 8.2 %. , continued metformin, Farxiga and adjusted NovoLog Mix    Today (05/03/2021): Ralph [name] is here for  follow-up appointment on diabetes management He checks his blood sugars   multiple times a day through CGM.  The patient has not had hypoglycemic episodes since the last clinic visit.    Was evaluated by podiatry 04/14/2021   He has an appointment 05/2021 to discuss candidacy for liver transplant   He is undergoing localized hepatocellular treatment, patient did have abdominal pain 2 days ago  [place]:  - Metformin 1000 mg Twice a day with meals  - Farxiga 10 mg once a day  - Tresiba 30 units daily  - Novolog 10 units TIDQAC -CF : NOvolog (BG -130/25)      CONTINUOUS GLUCOSE MONITORING RECORD INTERPRETATION    Dates of Recording: 1/10-1/23/2023  Sensor description:dexcom  Results statistics:   CGM use % of time 93  Average and SD 161/34  Time in range   73    %  % Time Above 180 25  % Time above 250 <2  % Time Below target 0     Glycemic patterns summary: Optimal BG's during the day, hyperglycemia at night   Hyperglycemic episodes  post supper   Hypoglycemic episodes occurred n/a  Overnight periods: high         DIABETIC COMPLICATIONS: Microvascular complications:   Neuropathy Denies: retinopathy  Last eye exam: Completed 09/2019   Macrovascular complications:  CHF  Denies: CAD, PVD, CVA  HISTORY:  Past Medical History:  Past Medical History:  Diagnosis Date   Anginal pain ([place])    Arthritis    "back; fingers" (01/06/2012)   Atrial flutter ([place])  s/p EPS +RF ablation of typical atrial flutter April 2015   Cancer [facility])    skin cancer   CHB (complete heart block) ([facility])    CHF (congestive heart failure) ([place]) 01/06/2012   Chronic lower back pain    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   Coughing up blood    "comes from my throat" (01/06/2012)   DDD (degenerative disc disease), lumbar    Depressed    Difficult intubation    Eschmann stylet used in 2002 and 2007; "trouble waking up afterwards" (01/06/2012)   Emphysema    Fatty liver disease, nonalcoholic    GERD (gastroesophageal reflux disease)     H/O hiatal hernia    Headache    History of esophageal varices    Hypertension    Hypothyroidism    Orthostatic dizziness    Pacemaker    Pneumonia Aug 2016   Presence of permanent cardiac pacemaker 9/292013   [place]   Sinus pause 01/06/2012   5.2 seconds   Sleep apnea    "don't wear mask" (01/06/2012)   Stroke [facility])    pt states that he might have had a stroke not sure   Type II diabetes mellitus ([place])    Varicose vein    of esophagus   Past Surgical History:  Past Surgical History:  Procedure Laterality Date   ATRIAL FLUTTER ABLATION N/A 07/10/2013   Procedure: ATRIAL FLUTTER ABLATION;  Surgeon: Evans Lance, MD;  Location: [facility] CATH LAB;  Service: Cardiovascular;  Laterality: N/A;   BACK SURGERY     CHOLECYSTECTOMY  1993   COLONOSCOPY  11/08/2004   MWU:XLKGMW rectum, colon, TI.   COLONOSCOPY N/A 05/28/2014   Dr. Gala Romney: Redundant colon. single colonic polyp removed as described above. Tubular adenoma   ESOPHAGEAL DILATION N/A 05/28/2014   Procedure: ESOPHAGEAL DILATION;  Surgeon: Daneil Dolin, MD;  Location[facility] END[facility];  Service: Endoscopy;  Laterality: N/A;   ESOPHAGOGASTRODUODENOSCOPY  11/08/2004   NUU:VOZDGU esophageal erosions consistent with erosive reflux esophagitis/Areas of hemorrhage and nodularity of the fundal mucosa of uncertain significance, biopsied.  Small hiatal hernia, otherwise normal stomach   ESOPHAGOGASTRODUODENOSCOPY  2010   Dr. Gala Romney: 3 columns Grade 1 varices, erosive esophagitis, HH, portal gastropathy, normal D1, D2   ESOPHAGOGASTRODUODENOSCOPY N/A 05/28/2014   Dr. Gala Romney: MIld erosive reflux esophagitis. Grade 1 esophageal varices. Patent esophagus. No dilation performed. Hiatal hernia.    ESOPHAGOGASTRODUODENOSCOPY (EGD) WITH ESOPHAGEAL DILATION N/A 02/14/2013   YQI:HKVQQ 1 esophageal varices. Abnormal distal esophagus/status post biopsy after Maloney dilation. Portal gastropathy. Antral erosions-status post biopsy. path negative for H.pylori,  benign path.   IR ANGIOGRAM SELECTIVE EACH ADDITIONAL VESSEL  04/30/2021   IR ANGIOGRAM SELECTIVE EACH ADDITIONAL VESSEL  04/30/2021   IR ANGIOGRAM SELECTIVE EACH ADDITIONAL VESSEL  04/30/2021   IR ANGIOGRAM SELECTIVE EACH ADDITIONAL VESSEL  04/30/2021   IR ANGIOGRAM SELECTIVE EACH ADDITIONAL VESSEL  04/30/2021   IR ANGIOGRAM VISCERAL SELECTIVE  04/30/2021   IR ANGIOGRAM VISCERAL SELECTIVE  04/30/2021   IR EMBO ARTERIAL NOT HEMORR HEMANG INC GUIDE ROADMAPPING  04/30/2021   IR RADIOLOGIST EVAL & MGMT  03/03/2021   IR US GUIDE VASC ACCESS RIGHT  04/30/2021   LUMBAR [place] SURGERY  1994; ~ 1995; ~ [place]   nuclear stress test  10/19/2004   No ischemia   PERMANENT PACEMAKER INSERTION  01/08/2012   CHB   PERMANENT PACEMAKER INSERTION N/A 01/09/2012   Procedure: PERMANENT  PACEMAKER INSERTION;  Surgeon: Sanda Klein, MD;  Location: [facility] CATH LAB;  Service: Cardiovascular;  Laterality: N/A;   POSTERIOR FUSION LUMBAR SPINE  1999   L4-5   SPINAL CORD STIMULATOR IMPLANT  2006   SPINAL CORD STIMULATOR REMOVAL N/A 01/27/2015   Procedure: LUMBAR SPINAL CORD STIMULATOR REMOVAL;  Surgeon: Kristeen Miss, MD;  Location: [place] NEURO ORS;  Service: Neurosurgery;  Laterality: N/A;  LUMBAR SPINAL CORD STIMULATOR REMOVAL   TONSILLECTOMY AND ADENOIDECTOMY  1992   US ECHOCARDIOGRAPHY  12/28/2011   mild LVH,mild mitral annulara ca+,mild MR   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 He started smoking about 53 years ago. He has a 11.25 pack-year smoking history. He has never used smokeless tobacco. He reports that he does not drink alcohol and does not use drugs. Family History:  Family History  Problem Relation Age of Onset   Cancer Mother        Deceased, 95   Ovarian cancer Mother    Arrhythmia Father    Other Father        Deceased 66   Stroke Brother    Stroke Brother    Stroke Sister    Crohn's disease Daughter    Diabetes  Maternal Grandmother    Colon cancer Neg Hx      HOME MEDICATIONS: Allergies as of 05/03/2021       Reactions   Nitroglycerin Hives, Swelling, Rash        Medication List        Accurate as of May 03, 2021 12:39 PM. If you have any questions, ask your nurse or doctor.          Accu-Chek Aviva device by Other route. Use as instructed to check blood sugar 2 times daily   Accu-Chek Guide test strip Generic drug: glucose blood TEST TWICE A DAY BEFORE MEALS   ciclopirox 8 % solution Commonly known as: PENLAC APPLY TOPICALLY AT BEDTIME. APPLY OVER NAIL AND SURROUNDING SKIN. APPLY DAILY OVER PREVIOUS COAT. AFTER SEVEN (7) DAYS, MAY REMOVE WITH ALCOHOL AND CONTINUE CYCLE.   dapagliflozin propanediol 10 MG Tabs tablet Commonly known as: Farxiga Take 1 tablet (10 mg total) by mouth daily. What changed: how much to take Changed by: Dorita Sciara, MD   Dexcom G6 Receiver [facility] Use as instruct to check blood sugar daily   Dexcom G6 Sensor Misc 1 Device by Does not apply route as directed.   Dexcom G6 Transmitter Misc 1 Device by Does not apply route as directed.   diazepam 10 MG tablet Commonly known as: VALIUM Take 1 tablet by mouth every 8 (eight) hours as needed (muscle spasms).   diltiazem 300 MG 24 hr capsule Commonly known as: CARDIZEM CD TAKE 1 CAPSULE BY MOUTH EVERY DAY   escitalopram 10 MG tablet Commonly known as: LEXAPRO Take 10 mg by mouth daily.   Fish Oil 1000 MG Caps Take 1,000-2,000 mg by mouth See admin instructions. Take 2000mg  in the AM and 1000mg  in the PM   flecainide 100 MG tablet Commonly known as: TAMBOCOR TAKE 1 TABLET BY MOUTH TWICE A DAY   furosemide 40 MG tablet Commonly known as: LASIX Take 40 mg by mouth daily as needed for fluid or edema.   Insulin Aspart FlexPen 100 UNIT/ML Commonly known as: NOVOLOG Max daily 50 units What changed: See the new instructions. Changed by: Dorita Sciara, MD   Insulin  Degludec FlexTouch 100 UNIT/ML Sopn Inject 32 Units into the skin daily. What changed: See the new instructions. Changed by: Dorita Sciara, MD   Insulin Pen Needle 31G X 5 MM Misc 1 Device by Does not apply route in the morning, at noon, in the evening, and at bedtime.   levothyroxine 112 MCG tablet Commonly known as: SYNTHROID TAKE 1 TABLET BY MOUTH EVERY MORNING BEFORE BREAKFAST   metFORMIN 1000 MG tablet Commonly known as: GLUCOPHAGE Take 1 tablet (1,000 mg total) by mouth 2 (two) times daily with a meal.   oxymetazoline 0.05 % nasal spray Commonly known as: AFRIN Place 1 spray into both nostrils 2 (two) times daily as needed for congestion. Sinex brand   pantoprazole 40 MG tablet Commonly known as: PROTONIX Take 1 tablet (40 mg total) by mouth daily.   propranolol 20 MG tablet Commonly known as: INDERAL TAKE 1 TABLET BY MOUTH TWICE A DAY   rivaroxaban 20 MG Tabs tablet Commonly known as: Xarelto TAKE 1 TABLET BY MOUTH EVERY DAY WITH DINNER   simvastatin 20 MG tablet Commonly known as: ZOCOR TAKE 1 TABLET BY MOUTH EVERY DAY IN THE EVENING   spironolactone 50 MG tablet Commonly known as: ALDACTONE TAKE 1 TABLET BY MOUTH TWICE A DAY   Systane 0.4-0.3 % Soln Generic drug: Polyethyl Glycol-Propyl Glycol Place 1 drop into both eyes every 6 (six) hours as needed (dry eyes).         OBJECTIVE:   Vital Signs: BP (!) 144/67 (BP Location: Left Arm, Patient Position: Sitting, Cuff Size: Small)    Pulse 67    Ht 5\' 10"  (1.778 m)    BMI 33.00 kg/m   Wt Readings from Last 3 Encounters:  04/30/21 230 lb (104.3 kg)  02/18/21 225 lb (102.1 kg)  12/09/20 222 lb 3.2 oz (100.8 kg)   Exam: General: Pt appears well and is in NAD  Lungs: Clear with good BS bilat with no rales, rhonchi, or wheezes  Heart: RRR with normal S1 and S2 and no gallops; no murmurs; no rub  Abdomen: Normoactive bowel sounds, soft, nontender, without masses or organomegaly palpable   Extremities: Trace pretibial edema.   Neuro: MS is good with appropriate affect, pt is alert and Ox3   DM foot exam: 10/29/2020    The skin of the feet is without sores or ulcerations. Multiple plantar callous formation noted The pedal pulses are 2+ on right and 2+ on left. The sensation is decreased to a screening 5.07, 10 gram monofilament bilaterally     DATA REVIEWED: 07/11/2020  BUN/Cr 25/0.8  LDL 66 TSH 1.4  ASSESSMENT / PLAN / RECOMMENDATIONS:   1) Type 2 Diabetes Mellitus, with improving glycemic control, with Neuropathic  complications - Most recent A1c of 8.2%. Goal A1c <7.0 %.    -This was a virtual visit, in review of his CGM the patient has been noted with improved glycemic control is within target range has improved from 65% to 73% -He has been noted with tight BG's during the day, the patient has been advised to reduce his prandial dose by 50% if he is going to eat half of the meal.  Patient was advised to skip NovoLog 10 units if he were to skip eating meals but may use correction scale if needed for hyperglycemia - He is intolerant to GLP-1 agonists due to nausea    MEDICATIONS: Continue metformin 1000 mg twice a day with meals Continue Farxiga 10 mg once a day Increase  [place] to 32 units daily Continue NovoLog 10 units with each meal Correction factor: NovoLog (BG -130/25      EDUCATION / INSTRUCTIONS: BG monitoring instructions: Patient is instructed to check his blood sugars 2 times a day, fasting and bedtime. Call [place] [facility] clinic if: BG persistently < 70  I reviewed the Rule of 15 for the treatment of hypoglycemia in detail with the patient. Literature supplied.     Signed electronically by: Mack Guise, MD  [facility]  [facility] [address] [place], [place] [address] Phone: [phone] FAX: [phone]   CC: Sharilyn Sites, [place]  [address] Phone: [phone]  Fax: [phone]  Return to Endocrinology clinic as below: Future Appointments  Date Time Provider [place]  05/24/2021  7:30 AM [facility] ROOM [facility] None  05/24/2021  9:30 AM WL-IR 1 WL-IR [place]  05/24/2021 10:30 AM WL-NM 1 WL-NM [place]  05/24/2021 12:00 PM WL-NM 1 WL-NM [place]  05/26/2021  9:15 AM Evans Lance, MD [facility] [place] [facility]  06/01/2021  1:00 PM Ahmed Prima Fransisco Hertz, PA-C [facility] [place]  06/08/2021  7:50 AM [facility] DEVICE REMOTES [facility]  07/13/2021 10:45 AM Lorenda Peck, MD [facility]  09/07/2021  7:50 AM [facility] DEVICE REMOTES [facility]

## 2021-05-07 ENCOUNTER — Encounter: Payer: Self-pay | Admitting: Internal Medicine

## 2021-05-07 ENCOUNTER — Telehealth (HOSPITAL_COMMUNITY): Payer: Self-pay | Admitting: Interventional Radiology

## 2021-05-07 NOTE — Progress Notes (Signed)
Vascular and Interventional Radiology  On Call Phone Note  Patient: Ralph [name] DOB: 05/08/1950 Medical Record Number: [id number] Note Date/Time: 05/07/21 4:40 PM   Diagnosis:   [place]. Abdominal pain.   I identified myself to the patient and conveyed my credentials to [place] emergencies, Pt was advised to call 911 or go to the nearest emergency room.   Subjective: Briefly, 71 y.o. year old male comorbid including NASH cirrhosis with recent Dx of HCC. Pt known to my colleague, Dr. Serafina Royals, and underwent hepatic angiography and MAA mapping for radio-embolization on 04/30/21. Pt reached out to [place] On Call Physician with concern for post procedure abdominal pain.  He describes migrating abdominal pain, at epigastrium and LUQ. Denies F/C, N/V/D. ROS otherwise negative. He reports that the pain has subsided from a peak of 8/10 to a current 3/10 max.  Assessment   Plan: 71 y.o. year old male reached out to [place] On Call Physician with concern for abdominal pain s/p hepatic angiography and MAA mapping pre embolization.   - Waning abdominal pain. No concern at this time. - OTC PRN - Pt knows to present to ER if symptoms worsen.  Follow up No follow-ups on file.  Radio-embolization planned for 05/24/21.   Michaelle Birks, MD [facility]   Pager. [phone] Clinic. [phone]   As part of this Telephone encounter, no in-person exam was conducted.  The patient was physically located in [place] or a state in which I am permitted to provide care. The encounter was reasonable and appropriate under the circumstances given the patient's presentation at the time.  The patient and/or parent/guardian has been advised of the potential risks and limitations of this mode of treatment (including, but not limited to, the absence of in-person examination) and has agreed to be treated using telemedicine. The  patient's/patient's family's questions regarding their request have been answered and/or has also been advised to contact their providers office for worsening conditions, and seek emergency medical treatment and/or call 911 if the patient deems either necessary.

## 2021-05-13 DIAGNOSIS — E1165 Type 2 diabetes mellitus with hyperglycemia: Secondary | ICD-10-CM | POA: Diagnosis not present

## 2021-05-21 ENCOUNTER — Other Ambulatory Visit: Payer: Self-pay | Admitting: Radiology

## 2021-05-21 ENCOUNTER — Other Ambulatory Visit: Payer: Self-pay | Admitting: Cardiology

## 2021-05-21 ENCOUNTER — Other Ambulatory Visit (HOSPITAL_COMMUNITY): Payer: Self-pay | Admitting: Physician Assistant

## 2021-05-21 ENCOUNTER — Other Ambulatory Visit: Payer: Self-pay | Admitting: Internal Medicine

## 2021-05-24 ENCOUNTER — Other Ambulatory Visit (HOSPITAL_COMMUNITY): Payer: Self-pay | Admitting: Interventional Radiology

## 2021-05-24 ENCOUNTER — Encounter (HOSPITAL_COMMUNITY)
Admission: RE | Admit: 2021-05-24 | Discharge: 2021-05-24 | Disposition: A | Discharge status: Home / Self Care | Payer: Medicare HMO | Source: Ambulatory Visit | Attending: Interventional Radiology | Admitting: Interventional Radiology

## 2021-05-24 ENCOUNTER — Ambulatory Visit (HOSPITAL_COMMUNITY)
Admission: RE | Admit: 2021-05-24 | Discharge: 2021-05-24 | Disposition: A | Discharge status: Home / Self Care | Payer: Medicare HMO | Source: Ambulatory Visit | Attending: Interventional Radiology | Admitting: Interventional Radiology

## 2021-05-24 ENCOUNTER — Other Ambulatory Visit: Payer: Self-pay

## 2021-05-24 DIAGNOSIS — G4730 Sleep apnea, unspecified: Secondary | ICD-10-CM | POA: Insufficient documentation

## 2021-05-24 DIAGNOSIS — E119 Type 2 diabetes mellitus without complications: Secondary | ICD-10-CM | POA: Diagnosis not present

## 2021-05-24 DIAGNOSIS — Z7984 Long term (current) use of oral hypoglycemic drugs: Secondary | ICD-10-CM | POA: Insufficient documentation

## 2021-05-24 DIAGNOSIS — C220 Liver cell carcinoma: Secondary | ICD-10-CM

## 2021-05-24 DIAGNOSIS — Z950 Presence of cardiac pacemaker: Secondary | ICD-10-CM | POA: Diagnosis not present

## 2021-05-24 DIAGNOSIS — Z79890 Hormone replacement therapy (postmenopausal): Secondary | ICD-10-CM | POA: Insufficient documentation

## 2021-05-24 DIAGNOSIS — K7581 Nonalcoholic steatohepatitis (NASH): Secondary | ICD-10-CM | POA: Diagnosis not present

## 2021-05-24 DIAGNOSIS — K219 Gastro-esophageal reflux disease without esophagitis: Secondary | ICD-10-CM | POA: Insufficient documentation

## 2021-05-24 DIAGNOSIS — I4892 Unspecified atrial flutter: Secondary | ICD-10-CM | POA: Diagnosis not present

## 2021-05-24 DIAGNOSIS — K7460 Unspecified cirrhosis of liver: Secondary | ICD-10-CM | POA: Insufficient documentation

## 2021-05-24 DIAGNOSIS — Z794 Long term (current) use of insulin: Secondary | ICD-10-CM | POA: Diagnosis not present

## 2021-05-24 DIAGNOSIS — E039 Hypothyroidism, unspecified: Secondary | ICD-10-CM | POA: Diagnosis not present

## 2021-05-24 DIAGNOSIS — F32A Depression, unspecified: Secondary | ICD-10-CM | POA: Diagnosis not present

## 2021-05-24 DIAGNOSIS — I509 Heart failure, unspecified: Secondary | ICD-10-CM | POA: Diagnosis not present

## 2021-05-24 DIAGNOSIS — I110 Hypertensive heart disease with heart failure: Secondary | ICD-10-CM | POA: Diagnosis not present

## 2021-05-24 DIAGNOSIS — J449 Chronic obstructive pulmonary disease, unspecified: Secondary | ICD-10-CM | POA: Insufficient documentation

## 2021-05-24 HISTORY — PX: IR ANGIOGRAM SELECTIVE EACH ADDITIONAL VESSEL: IMG667

## 2021-05-24 HISTORY — PX: IR US GUIDE VASC ACCESS RIGHT: IMG2390

## 2021-05-24 HISTORY — PX: IR 3D INDEPENDENT WKST: IMG2385

## 2021-05-24 HISTORY — PX: IR EMBO TUMOR ORGAN ISCHEMIA INFARCT INC GUIDE ROADMAPPING: IMG5449

## 2021-05-24 LAB — COMPREHENSIVE METABOLIC PANEL
ALT: 46 U/L — ABNORMAL HIGH (ref 0–44)
AST: 29 U/L (ref 15–41)
Albumin: 4.2 g/dL (ref 3.5–5.0)
Alkaline Phosphatase: 111 U/L (ref 38–126)
Anion gap: 7 (ref 5–15)
BUN: 28 mg/dL — ABNORMAL HIGH (ref 8–23)
CO2: 25 mmol/L (ref 22–32)
Calcium: 9.1 mg/dL (ref 8.9–10.3)
Chloride: 101 mmol/L (ref 98–111)
Creatinine, Ser: 0.64 mg/dL (ref 0.61–1.24)
GFR, Estimated: >60 mL/min (ref >60)
Glucose, Bld: 185 mg/dL — ABNORMAL HIGH (ref 70–99)
Potassium: 4.7 mmol/L (ref 3.5–5.1)
Sodium: 133 mmol/L — ABNORMAL LOW (ref 135–145)
Total Bilirubin: 0.6 mg/dL (ref 0.3–1.2)
Total Protein: 7.6 g/dL (ref 6.5–8.1)

## 2021-05-24 LAB — CBC WITH DIFFERENTIAL/PLATELET
Abs Immature Granulocytes: 0.02 10*3/uL (ref 0.00–0.07)
Basophils Absolute: 0.1 10*3/uL (ref 0.0–0.1)
Basophils Relative: 1 %
Eosinophils Absolute: 0.1 10*3/uL (ref 0.0–0.5)
Eosinophils Relative: 2 %
HCT: 53.1 % — ABNORMAL HIGH (ref 39.0–52.0)
Hemoglobin: 17.9 g/dL — ABNORMAL HIGH (ref 13.0–17.0)
Immature Granulocytes: 0 %
Lymphocytes Relative: 20 %
Lymphs Abs: 1.6 10*3/uL (ref 0.7–4.0)
MCH: 33.2 pg (ref 26.0–34.0)
MCHC: 33.7 g/dL (ref 30.0–36.0)
MCV: 98.5 fL (ref 80.0–100.0)
Monocytes Absolute: 0.6 10*3/uL (ref 0.1–1.0)
Monocytes Relative: 8 %
Neutro Abs: 5.6 10*3/uL (ref 1.7–7.7)
Neutrophils Relative %: 69 %
Platelets: 160 10*3/uL (ref 150–400)
RBC: 5.39 MIL/uL (ref 4.22–5.81)
RDW: 13.2 % (ref 11.5–15.5)
WBC: 8 10*3/uL (ref 4.0–10.5)
nRBC: 0 % (ref 0.0–0.2)

## 2021-05-24 LAB — PROTIME-INR
INR: 1.3 — ABNORMAL HIGH (ref 0.8–1.2)
Prothrombin Time: 15.8 seconds — ABNORMAL HIGH (ref 11.4–15.2)

## 2021-05-24 LAB — GLUCOSE, CAPILLARY: Glucose-Capillary: 193 mg/dL — ABNORMAL HIGH (ref 70–99)

## 2021-05-24 MED ORDER — ONDANSETRON HCL 4 MG/2ML IJ SOLN
4.0000 mg | INTRAMUSCULAR | Status: AC
  Administered 2021-05-24: 4 mg via INTRAVENOUS
  Filled 2021-05-24 (×2): qty 2

## 2021-05-24 MED ORDER — SODIUM CHLORIDE 0.9 % IV SOLN
2.0000 g | Freq: Once | INTRAVENOUS | Status: AC
  Administered 2021-05-24: 2 g via INTRAVENOUS
  Filled 2021-05-24: qty 2

## 2021-05-24 MED ORDER — SODIUM CHLORIDE 0.9 % IV SOLN: INTRAVENOUS | Status: DC

## 2021-05-24 MED ORDER — IOHEXOL 300 MG/ML  SOLN
100.0000 mL | Freq: Once | INTRAMUSCULAR | Status: AC | PRN
  Administered 2021-05-24: 50 mL via INTRA_ARTERIAL

## 2021-05-24 MED ORDER — PANTOPRAZOLE SODIUM 40 MG IV SOLR
40.0000 mg | Freq: Once | INTRAVENOUS | Status: AC
  Administered 2021-05-24: 40 mg via INTRAVENOUS
  Filled 2021-05-24 (×2): qty 10

## 2021-05-24 MED ORDER — FENTANYL CITRATE (PF) 100 MCG/2ML IJ SOLN
INTRAMUSCULAR | Status: AC | PRN
Start: 2021-05-24 — End: 2021-05-24
  Administered 2021-05-24: 50 ug via INTRAVENOUS

## 2021-05-24 MED ORDER — LIDOCAINE HCL 1 % IJ SOLN
INTRAMUSCULAR | Status: AC
  Filled 2021-05-24: qty 20

## 2021-05-24 MED ORDER — IOHEXOL 300 MG/ML  SOLN
100.0000 mL | Freq: Once | INTRAMUSCULAR | Status: AC | PRN
  Administered 2021-05-24: 100 mL via INTRA_ARTERIAL

## 2021-05-24 MED ORDER — YTTRIUM 90 INJECTION
11.5000 | INJECTION | Freq: Once | INTRAVENOUS | Status: AC
  Administered 2021-05-24: 11.5 via INTRA_ARTERIAL

## 2021-05-24 MED ORDER — MIDAZOLAM HCL 2 MG/2ML IJ SOLN
INTRAMUSCULAR | Status: AC
  Filled 2021-05-24: qty 4

## 2021-05-24 MED ORDER — LIDOCAINE HCL (PF) 1 % IJ SOLN
INTRAMUSCULAR | Status: AC | PRN
  Administered 2021-05-24: 5 mL

## 2021-05-24 MED ORDER — MIDAZOLAM HCL 2 MG/2ML IJ SOLN
INTRAMUSCULAR | Status: AC | PRN
  Administered 2021-05-24: 1 mg via INTRAVENOUS

## 2021-05-24 MED ORDER — DEXAMETHASONE SODIUM PHOSPHATE 10 MG/ML IJ SOLN
8.0000 mg | Freq: Once | INTRAMUSCULAR | Status: DC
  Filled 2021-05-24: qty 1
  Filled 2021-05-24: qty 0.8

## 2021-05-24 MED ORDER — FENTANYL CITRATE (PF) 100 MCG/2ML IJ SOLN
INTRAMUSCULAR | Status: AC
  Filled 2021-05-24: qty 2

## 2021-05-24 NOTE — Procedures (Signed)
Interventional Radiology Procedure Note  Procedure: Transarterial segmentectomy radioembolization of left lobe liver mass  Findings: Please refer to procedural dictation for full description. Left hepatic accessory segment 4B radiation segmentectomy.  6 Fr right CFA angioseal.  Complications: None immediate  Estimated Blood Loss: < 5 mL  Recommendations: Strict 4 hour bedrest, flat for 2 hours (until 14:30) followed by head up bed up to 30 degrees for 2 hours (until 16:30). Follow up in IR clinic in 3-4 weeks, no imaging at that time.   Ruthann Cancer, MD Pager: [phone]

## 2021-05-24 NOTE — Sedation Documentation (Signed)
To nuc med for study 

## 2021-05-24 NOTE — Progress Notes (Signed)
Pt has Continuous Glucose monitor on left upper abdomen and permanent pacer.

## 2021-05-24 NOTE — H&P (Signed)
Referring Physician(s): Drazek,D  Supervising Physician: Ruthann Cancer  Patient Status:  [facility] OP   Chief Complaint:  Hepatocellular carcinoma  Subjective: Patient familiar to IR service from  left liver mass biopsy on 02/18/2021 and consultation with Dr. Serafina Royals on 03/03/2021 to discuss treatment options for hepatocellular carcinoma. He is s a 71 y.o. male smoker with history of NASH cirrhosis and multiple comorbidities  including CHF with pacemaker, atrial flutter with prior ablation, GERD, esophageal varices, depression, hypothyroidism, sleep apnea, COPD, diabetes, and hypertension. He was noted to have a left hepatic mass on screening ultrasound on 07/01/20 which prompted a multiphase CT on which the mass was not described but in retrospect was subtly present. This ultimately prompted ultrasound guided liver mass biopsy on 02/18/21 with pathologic confirmation of moderately differentiated hepatocellular carcinoma.  Following discussions with Dr. Serafina Royals he was deemed an appropriate candidate for Y 90 hepatic radioembolization and presents today for the procedure. He currently denies fever,HA,CP,worsening dyspnea, cough, N/V or bleeding. He does have some mild RUQ/epigastric discomfort and chronic back pain.    Past Medical History:  Diagnosis Date   Anginal pain ([place])    Arthritis    "back; fingers" (01/06/2012)   Atrial flutter (HCC)    s/p EPS +RF ablation of typical atrial flutter April 2015   Cancer [facility])    skin cancer   CHB (complete heart block) (HCC)    CHF (congestive heart failure) ([place]) 01/06/2012   Chronic lower back pain    Cirrhosis ([place])    NASH-Hep A and B immune   Coughing up blood    "comes from my throat" (01/06/2012)   DDD (degenerative disc disease), lumbar    Depressed    Difficult intubation    Eschmann stylet used in 2002 and 2007; "trouble waking up afterwards" (01/06/2012)   Emphysema    Fatty liver disease, nonalcoholic    GERD (gastroesophageal reflux  disease)    H/O hiatal hernia    Headache    History of esophageal varices    Hypertension    Hypothyroidism    Orthostatic dizziness    Pacemaker    Pneumonia Aug 2016   Presence of permanent cardiac pacemaker 9/292013   [place]   Sinus pause 01/06/2012   5.2 seconds   Sleep apnea    "don't wear mask" (01/06/2012)   Stroke [facility])    pt states that he might have had a stroke not sure   Type II diabetes mellitus ([place])    Varicose vein    of esophagus   Past Surgical History:  Procedure Laterality Date   ATRIAL FLUTTER ABLATION N/A 07/10/2013   Procedure: ATRIAL FLUTTER ABLATION;  Surgeon: Evans Lance, MD;  Location: [facility] CATH LAB;  Service: Cardiovascular;  Laterality: N/A;   BACK SURGERY     CHOLECYSTECTOMY  1993   COLONOSCOPY  11/08/2004   BDZ:HGDJME rectum, colon, TI.   COLONOSCOPY N/A 05/28/2014   Dr. Gala Romney: Redundant colon. single colonic polyp removed as described above. Tubular adenoma   ESOPHAGEAL DILATION N/A 05/28/2014   Procedure: ESOPHAGEAL DILATION;  Surgeon: Daneil Dolin, MD;  Location: [facility] ENDO SUITE;  Service: Endoscopy;  Laterality: N/A;   ESOPHAGOGASTRODUODENOSCOPY  11/08/2004   QAS:[id number] esophageal erosions consistent with erosive reflux esophagitis/Areas of hemorrhage and nodularity of the fundal mucosa of uncertain significance, biopsied.  Small hiatal hernia, otherwise normal stomach   ESOPHAGOGASTRODUODENOSCOPY  2010   Dr. Gala Romney: 3 columns Grade 1 varices, erosive esophagitis, HH, portal gastropathy, normal D1,  D2   ESOPHAGOGASTRODUODENOSCOPY N/A 05/28/2014   Dr. Gala Romney: MIld erosive reflux esophagitis. Grade 1 esophageal varices. Patent esophagus. No dilation performed. Hiatal hernia.    ESOPHAGOGASTRODUODENOSCOPY (EGD) WITH ESOPHAGEAL DILATION N/A 02/14/2013   PNT:IRWER 1 esophageal varices. Abnormal distal esophagus/status post biopsy after Maloney dilation. Portal gastropathy. Antral erosions-status post biopsy. path negative for H.pylori, benign path.   IR  ANGIOGRAM SELECTIVE EACH ADDITIONAL VESSEL  04/30/2021   IR ANGIOGRAM SELECTIVE EACH ADDITIONAL VESSEL  04/30/2021   IR ANGIOGRAM SELECTIVE EACH ADDITIONAL VESSEL  04/30/2021   IR ANGIOGRAM SELECTIVE EACH ADDITIONAL VESSEL  04/30/2021   IR ANGIOGRAM SELECTIVE EACH ADDITIONAL VESSEL  04/30/2021   IR ANGIOGRAM VISCERAL SELECTIVE  04/30/2021   IR ANGIOGRAM VISCERAL SELECTIVE  04/30/2021   IR EMBO ARTERIAL NOT HEMORR HEMANG INC GUIDE ROADMAPPING  04/30/2021   IR RADIOLOGIST EVAL & MGMT  03/03/2021   IR US GUIDE VASC ACCESS RIGHT  04/30/2021   LUMBAR [place] SURGERY  1994; ~ 1995; ~ [place]   nuclear stress test  10/19/2004   No ischemia   PERMANENT PACEMAKER INSERTION  01/08/2012   CHB   PERMANENT PACEMAKER INSERTION N/A 01/09/2012   Procedure: PERMANENT PACEMAKER INSERTION;  Surgeon: Sanda Klein, MD;  Location: [place] CATH LAB;  Service: Cardiovascular;  Laterality: N/A;   POSTERIOR FUSION LUMBAR SPINE  1999   L4-5   SPINAL CORD STIMULATOR IMPLANT  2006   SPINAL CORD STIMULATOR REMOVAL N/A 01/27/2015   Procedure: LUMBAR SPINAL CORD STIMULATOR REMOVAL;  Surgeon: Kristeen Miss, MD;  Location: [place] NEURO ORS;  Service: Neurosurgery;  Laterality: N/A;  LUMBAR SPINAL CORD STIMULATOR REMOVAL   TONSILLECTOMY AND ADENOIDECTOMY  1992   US ECHOCARDIOGRAPHY  12/28/2011   mild LVH,mild mitral annulara ca+,mild MR   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 Authorizing Provider  dapagliflozin propanediol (FARXIGA) 10 MG TABS tablet Take 1 tablet (10 mg total) by mouth daily. 05/03/21  Yes Shamleffer, Melanie Crazier, MD  diltiazem (CARDIZEM CD) 300 MG 24 hr capsule TAKE 1 CAPSULE BY MOUTH EVERY DAY 05/01/20  Yes Branch, Alphonse Guild, MD  escitalopram (LEXAPRO) 10 MG tablet Take 10 mg by mouth daily. 04/19/19  Yes [provider]  flecainide (TAMBOCOR) 100 MG  tablet TAKE 1 TABLET BY MOUTH TWICE A DAY 05/21/21  Yes Evans Lance, MD  Insulin Aspart FlexPen (NOVOLOG) 100 UNIT/ML Max daily 50 units 05/03/21  Yes Shamleffer, Melanie Crazier, MD  Insulin Degludec FlexTouch 100 UNIT/ML SOPN Inject 32 Units into the skin daily. 05/03/21  Yes Shamleffer, Melanie Crazier, MD  levothyroxine (SYNTHROID, LEVOTHROID) 112 MCG tablet TAKE 1 TABLET BY MOUTH EVERY MORNING BEFORE BREAKFAST 04/24/17  Yes Nida, Marella Chimes, MD  metFORMIN (GLUCOPHAGE) 1000 MG tablet Take 1 tablet (1,000 mg total) by mouth 2 (two) times daily with a meal. 05/03/21  Yes Shamleffer, Melanie Crazier, MD  Omega-3 Fatty Acids (FISH OIL) 1000 MG CAPS Take 1,000-2,000 mg by mouth See admin instructions. Take 2000mg  in the AM and 1000mg  in the PM   Yes [provider]  pantoprazole (PROTONIX) 40 MG tablet Take 1 tablet (40 mg total) by mouth daily. 03/01/21  Yes Aliene Altes S, PA-C  propranolol (INDERAL) 20 MG tablet TAKE 1 TABLET BY MOUTH TWICE A DAY 02/23/21  Yes Mahala Menghini, PA-C  rivaroxaban (XARELTO) 20 MG TABS  tablet TAKE 1 TABLET BY MOUTH EVERY DAY WITH DINNER 05/09/18  Yes Evans Lance, MD  simvastatin (ZOCOR) 20 MG tablet TAKE 1 TABLET BY MOUTH EVERY DAY IN THE EVENING 05/21/21  Yes Branch, Alphonse Guild, MD  spironolactone (ALDACTONE) 50 MG tablet TAKE 1 TABLET BY MOUTH TWICE A DAY 02/23/21  Yes Mahala Menghini, PA-C  ACCU-CHEK GUIDE test strip TEST TWICE A DAY BEFORE MEALS 01/13/20   Shamleffer, Melanie Crazier, MD  Blood Glucose Monitoring Suppl (ACCU-CHEK AVIVA) device by Other route. Use as instructed to check blood sugar 2 times daily    [provider]  ciclopirox (PENLAC) 8 % solution APPLY TOPICALLY AT BEDTIME. APPLY OVER NAIL AND SURROUNDING SKIN. APPLY DAILY OVER PREVIOUS COAT. AFTER SEVEN (7) DAYS, MAY REMOVE WITH ALCOHOL AND CONTINUE CYCLE. 03/08/21   Lorenda Peck, MD  Continuous Blood Gluc Receiver (DEXCOM G6 RECEIVER) DEVI Use as instruct to check blood  sugar daily 05/18/20   Shamleffer, Melanie Crazier, MD  Continuous Blood Gluc Sensor (DEXCOM G6 SENSOR) MISC 1 Device by Does not apply route as directed. 04/30/20   Shamleffer, Melanie Crazier, MD  Continuous Blood Gluc Transmit (DEXCOM G6 TRANSMITTER) MISC 1 Device by Does not apply route as directed. 04/30/20   Shamleffer, Melanie Crazier, MD  diazepam (VALIUM) 10 MG tablet Take 1 tablet by mouth every 8 (eight) hours as needed (muscle spasms). 05/20/19   [provider]  furosemide (LASIX) 40 MG tablet Take 40 mg by mouth daily as needed for fluid or edema.    [provider]  Insulin Pen Needle 31G X 5 MM MISC 1 Device by Does not apply route in the morning, at noon, in the evening, and at bedtime. 05/03/21   Shamleffer, Melanie Crazier, MD  oxymetazoline (AFRIN) 0.05 % nasal spray Place 1 spray into both nostrils 2 (two) times daily as needed for congestion. Sinex brand    [provider]  Polyethyl Glycol-Propyl Glycol (SYSTANE) 0.4-0.3 % SOLN Place 1 drop into both eyes every 6 (six) hours as needed (dry eyes).    [provider]     Vital Signs: BP (!) 142/71 (BP Location: Right Arm)    Pulse 74    Temp (!) 97.4 F (36.3 C) (Oral)    Resp 15    SpO2 96%   Physical Exam awake/alert; chest- distant BS bilat, few exp wheezes; heart- RRR, left chest wall pacer; abd- protuberant,+BS, mild tenderness RUQ/epigastric region; no sig LE edema  Imaging: No results found.  Labs:  CBC: Recent Labs    12/16/20 0826 02/18/21 0630 04/30/21 0802 05/24/21 0800  WBC 7.5 9.0 8.9 8.0  HGB 17.9* 17.9* 17.5* 17.9*  HCT 50.4 53.0* 51.9 53.1*  PLT 183 206 181 160    COAGS: Recent Labs    07/01/20 1045 02/18/21 0630 04/30/21 0802 05/24/21 0800  INR 1.3* 1.1 1.4* 1.3*    BMP: Recent Labs    07/30/20 1630 12/16/20 0826 04/06/21 1307 04/30/21 0802  NA  --  136  --  136  K  --  5.2  --  5.9*  CL  --  96  --  102  CO2  --  22  --  24  GLUCOSE  --  226*   --  170*  BUN  --  24  --  30*  CALCIUM  --  9.4  --  8.9  CREATININE 0.80 0.92 0.80 0.78  GFRNONAA  --   --   --  >60  LIVER FUNCTION TESTS: Recent Labs    12/16/20 0826 04/30/21 0802  BILITOT 0.5 1.4*  AST 33 41  ALT 53* 46*  ALKPHOS 179* 122  PROT 7.2 8.1  ALBUMIN 4.6 4.4    Assessment and Plan: Patient familiar to IR service from  left liver mass biopsy on 02/18/2021 and consultation with Dr. Serafina Royals on 03/03/2021 to discuss treatment options for hepatocellular carcinoma. He is s a 71 y.o. male smoker with history of NASH cirrhosis and multiple comorbidities  including CHF with pacemaker, atrial flutter with prior ablation, GERD, esophageal varices, depression, hypothyroidism, sleep apnea, COPD, diabetes, and hypertension. He was noted to have a left hepatic mass on screening ultrasound on 07/01/20 which prompted a multiphase CT on which the mass was not described but in retrospect was subtly present. This ultimately prompted ultrasound guided liver mass biopsy on 02/18/21 with pathologic confirmation of moderately differentiated hepatocellular carcinoma.  Following discussions with Dr. Serafina Royals he was deemed an appropriate candidate for Y 90 hepatic radioembolization and presents today for the procedure.Risks and benefits of procedure were discussed with the patient including, but not limited to bleeding, infection, vascular injury or contrast induced renal failure.  This interventional procedure involves the use of X-rays and because of the nature of the planned procedure, it is possible that we will have prolonged use of X-ray fluoroscopy.  Potential radiation risks to you include (but are not limited to) the following: - A slightly elevated risk for cancer  several years later in life. This risk is typically less than 0.5% percent. This risk is low in comparison to the normal incidence of human cancer, which is 33% for women and 50% for men according to the [employer]. - Radiation induced injury can include skin redness, resembling a rash, tissue breakdown / ulcers and hair loss (which can be temporary or permanent).   The likelihood of either of these occurring depends on the difficulty of the procedure and whether you are sensitive to radiation due to previous procedures, disease, or genetic conditions.   IF your procedure requires a prolonged use of radiation, you will be notified and given written instructions for further action.  It is your responsibility to monitor the irradiated area for the 2 weeks following the procedure and to notify your physician if you are concerned that you have suffered a radiation induced injury.    All of the patient's questions were answered, patient is agreeable to proceed.  Consent signed and in chart.      Electronically Signed: D. Rowe Robert, PA-C 05/24/2021, 8:32 AM   I spent a total of  20 minutes at the the patient's bedside AND on the patient's hospital floor or unit, greater than 50% of which was counseling/coordinating care for hepatic/visceral arteriogram with Y-90 hepatic radioembolization

## 2021-05-24 NOTE — Discharge Instructions (Addendum)
Urgent needs - Interventional Radiology on call MD [phone]  Wound - May remove dressing and shower in 24 to 48 hours.  Keep site clean and dry.  Replace with bandaid as needed.  Do not submerge in tub or water until site healing well. If closed with glue, glue will flake off on its own.   Post Y-90 Radioembolization Discharge Instructions  You have been given a radioactive material during your procedure.  While it is safe for you to be discharged home from the hospital, you need to proceed directly home.    Do not use public transportation, including air travel, lasting more than 2 hours for 1 week.  Avoid crowded public places for 1 week.  Adult visitors should try to avoid close contact with you for 1 week.    Children and pregnant females should not visit or have close contact with you for 1 week.  Items that you touch are not radioactive.  Do not sleep in the same bed as your partner for 1 week, and a condom should be used for sexual activity during the first 24 hours.  Your blood may be radioactive and caution should be used if any bleeding occurs during the recovery period.  Body fluids may be radioactive for 24 hours.  Wash your hands after voiding.  Men should sit to urinate.  Dispose of any soiled materials (flush down toilet or place in trash at home) during the first day.  Drink 6 to 8 glasses of fluids per day for 5 days to hydrate yourself.  If you need to see a doctor during the first week, you must let them know that you were treated with yttrium-90 microspheres, and will be slightly radioactive.  They can call Interventional Radiology [phone] with any questions. Urgent needs - Interventional Radiology on call MD [phone]  Wound - May remove dressing and shower in 24 to 48 hours.  Keep site clean and dry.  Replace with bandaid as needed.  Do not submerge in tub or water until site healing well.       Moderate Conscious Sedation, Adult, Care After   This  sheet gives you information about how to care for yourself after your procedure. Your health care provider may also give you more specific instructions. If you have problems or questions, contact your health care provider. What can I expect after the procedure? After the procedure, it is common to have: Sleepiness for several hours. Impaired judgment for several hours. Difficulty with balance. Vomiting if you eat too soon. Follow these instructions at home: For the time period you were told by your health care provider: Rest. Do not participate in activities where you could fall or become injured. Do not drive or use machinery. Do not drink alcohol. Do not take sleeping pills or medicines that cause drowsiness. Do not make important decisions or sign legal documents. Do not take care of children on your own.      Eating and drinking Follow the diet recommended by your health care provider. Drink enough fluid to keep your urine pale yellow. If you vomit: Drink water, juice, or soup when you can drink without vomiting. Make sure you have little or no nausea before eating solid foods.   General instructions Take over-the-counter and prescription medicines only as told by your health care provider. Have a responsible adult stay with you for the time you are told. It is important to have someone help care for you until you are awake and  alert. Do not smoke. Keep all follow-up visits as told by your health care provider. This is important. Contact a health care provider if: You are still sleepy or having trouble with balance after 24 hours. You feel light-headed. You keep feeling nauseous or you keep vomiting. You develop a rash. You have a fever. You have redness or swelling around the IV site. Get help right away if: You have trouble breathing. You have new-onset confusion at home. Summary After the procedure, it is common to feel sleepy, have impaired judgment, or feel nauseous  if you eat too soon. Rest after you get home. Know the things you should not do after the procedure. Follow the diet recommended by your health care provider and drink enough fluid to keep your urine pale yellow. Get help right away if you have trouble breathing or new-onset confusion at home. This information is not intended to replace advice given to you by your health care provider. Make sure you discuss any questions you have with your health care provider. Document Revised: 07/26/2019 Document Reviewed: 02/21/2019 Elsevier Patient Education  2021 [employer].

## 2021-05-26 ENCOUNTER — Encounter: Payer: Medicare HMO | Admitting: Internal Medicine

## 2021-05-26 ENCOUNTER — Encounter: Payer: Self-pay | Admitting: *Deleted

## 2021-05-31 ENCOUNTER — Other Ambulatory Visit: Payer: Self-pay | Admitting: Interventional Radiology

## 2021-05-31 DIAGNOSIS — C220 Liver cell carcinoma: Secondary | ICD-10-CM

## 2021-06-01 ENCOUNTER — Encounter: Payer: Self-pay | Admitting: Student

## 2021-06-01 ENCOUNTER — Ambulatory Visit: Payer: Medicare HMO | Admitting: Student

## 2021-06-01 ENCOUNTER — Other Ambulatory Visit: Payer: Self-pay

## 2021-06-01 VITALS — BP 122/74 | HR 62 | Ht 70.0 in | Wt 225.4 lb

## 2021-06-01 DIAGNOSIS — I480 Paroxysmal atrial fibrillation: Secondary | ICD-10-CM

## 2021-06-01 DIAGNOSIS — K7460 Unspecified cirrhosis of liver: Secondary | ICD-10-CM | POA: Diagnosis not present

## 2021-06-01 DIAGNOSIS — I495 Sick sinus syndrome: Secondary | ICD-10-CM | POA: Diagnosis not present

## 2021-06-01 DIAGNOSIS — I10 Essential (primary) hypertension: Secondary | ICD-10-CM | POA: Diagnosis not present

## 2021-06-01 NOTE — Patient Instructions (Signed)
Medication Instructions:  Your physician recommends that you continue on your current medications as directed. Please refer to the Current Medication list given to you today.  *If you need a refill on your cardiac medications before your next appointment, please call your pharmacy*   Lab Work: NONE   If you have labs (blood work) drawn today and your tests are completely normal, you will receive your results only by: . MyChart Message (if you have MyChart) OR . A paper copy in the mail If you have any lab test that is abnormal or we need to change your treatment, we will call you to review the results.   Testing/Procedures: NONE    Follow-Up: At [facility], you and your health needs are our priority.  As part of our continuing mission to provide you with exceptional heart care, we have created designated Provider Care Teams.  These Care Teams include your primary Cardiologist (physician) and Advanced Practice Providers (APPs -  Physician Assistants and Nurse Practitioners) who all work together to provide you with the care you need, when you need it.  We recommend signing up for the patient portal called "MyChart".  Sign up information is provided on this After Visit Summary.  MyChart is used to connect with patients for Virtual Visits (Telemedicine).  Patients are able to view lab/test results, encounter notes, upcoming appointments, etc.  Non-urgent messages can be sent to your provider as well.   To learn more about what you can do with MyChart, go to [email or web address].    Your next appointment:   6 month(s)  The format for your next appointment:   In Person  Provider:   Jonathan Branch, MD   Other Instructions Thank you for choosing [facility]!    

## 2021-06-01 NOTE — Progress Notes (Signed)
Cardiology Office Note    Date:  06/01/2021   ID:  Ralph, [name] 11-17-1950, MRN [id number]  PCP:  Sharilyn Sites, MD  Cardiologist: Carlyle Dolly, MD   EP: Dr. Lovena Le  Chief Complaint  Patient presents with   Follow-up    6 month visit    History of Present Illness:    Ralph [name] is a 71 y.o. male with past medical history of symptomatic bradycardia (s/p St. Jude PPM placement in 12/2011), paroxysmal atrial flutter (s/p ablation in 07/2013), HTN, HLD, COPD and NASH cirrhosis with esophageal varices who presents to the office today for 41-month follow-up.   He was last examined by Dr. Harl Bowie in 11/2020 and reported occasional palpitations but no persistent symptoms.  He was on Propanolol due to palpitations and history of esophageal varices. The patient did contact the office in 02/2021 reporting he was undergoing work-up for a liver transplant and they had requested a stress test. This showed artifact but no evidence of ischemia and was overall a low-risk study.  By review of notes, he was diagnosed with hepatocellular carcinoma in the interim and underwent hepatic radioembolization earlier this month.  In talking with the patient today, he reports overall doing well from a cardiac perspective since his last office visit.  He does experience occasional palpitations but reports they are very brief and spontaneously resolve. Reports one episode was triggered by increased caffeine consumption. No recent exertional chest pain or dyspnea on exertion. Has occasional shooting pains which are very brief. Reports that he did have difficulty breathing with lying flat at the time of his recent radiation therapy but had to lie flat for 9 hours and has known back issues. Says that he usually sleeps on his side without respiratory difficulty and has undergone a sleep study in the past and was negative at that time. No reported orthopnea or PND since. No recent lower extremity edema.   Past  Medical History:  Diagnosis Date   Anginal pain ([place])    Arthritis    "back; fingers" (01/06/2012)   Atrial flutter (HCC)    s/p EPS +RF ablation of typical atrial flutter April 2015   Cancer [facility])    skin cancer   CHB (complete heart block) (HCC)    CHF (congestive heart failure) ([place]) 01/06/2012   Chronic lower back pain    Cirrhosis ([place])    NASH-Hep A and B immune   Coughing up blood    "comes from my throat" (01/06/2012)   DDD (degenerative disc disease), lumbar    Depressed    Difficult intubation    Eschmann stylet used in 2002 and 2007; "trouble waking up afterwards" (01/06/2012)   Emphysema    Fatty liver disease, nonalcoholic    GERD (gastroesophageal reflux disease)    H/O hiatal hernia    Headache    History of esophageal varices    Hypertension    Hypothyroidism    Orthostatic dizziness    Pacemaker    Pneumonia Aug 2016   Presence of permanent cardiac pacemaker 9/292013   St.Jude   Sinus pause 01/06/2012   5.2 seconds   Sleep apnea    "don't wear mask" (01/06/2012)   Stroke ([place])    pt states that he might have had a stroke not sure   Type II diabetes mellitus ([place])    Varicose vein    of esophagus    Past Surgical History:  Procedure Laterality Date   ATRIAL FLUTTER ABLATION N/A  07/10/2013   Procedure: ATRIAL FLUTTER ABLATION;  Surgeon: Evans Lance, MD;  Location: [facility] CATH LAB;  Service: Cardiovascular;  Laterality: N/A;   BACK SURGERY     CHOLECYSTECTOMY  1993   COLONOSCOPY  11/08/2004   ERD:EYCXKG rectum, colon, TI.   COLONOSCOPY N/A 05/28/2014   Dr. Gala Romney: Redundant colon. single colonic polyp removed as described above. Tubular adenoma   ESOPHAGEAL DILATION N/A 05/28/2014   Procedure: ESOPHAGEAL DILATION;  Surgeon: Daneil Dolin, MD;  [facility] ENDO [facility];  Service: Endoscopy;  Laterality: N/A;   ESOPHAGOGASTRODUODENOSCOPY  11/08/2004   YJE:HUDJSH esophageal erosions consistent with erosive reflux esophagitis/Areas of hemorrhage and nodularity  of the fundal mucosa of uncertain significance, biopsied.  Small hiatal hernia, otherwise normal stomach   ESOPHAGOGASTRODUODENOSCOPY  2010   Dr. Gala Romney: 3 columns Grade 1 varices, erosive esophagitis, HH, portal gastropathy, normal D1, D2   ESOPHAGOGASTRODUODENOSCOPY N/A 05/28/2014   Dr. Gala Romney: MIld erosive reflux esophagitis. Grade 1 esophageal varices. Patent esophagus. No dilation performed. Hiatal hernia.    ESOPHAGOGASTRODUODENOSCOPY (EGD) WITH ESOPHAGEAL DILATION N/A 02/14/2013   FWY:OVZCH 1 esophageal varices. Abnormal distal esophagus/status post biopsy after Maloney dilation. Portal gastropathy. Antral erosions-status post biopsy. path negative for H.pylori, benign path.   IR 3D INDEPENDENT WKST  05/24/2021   IR ANGIOGRAM SELECTIVE EACH ADDITIONAL VESSEL  04/30/2021   IR ANGIOGRAM SELECTIVE EACH ADDITIONAL VESSEL  04/30/2021   IR ANGIOGRAM SELECTIVE EACH ADDITIONAL VESSEL  04/30/2021   IR ANGIOGRAM SELECTIVE EACH ADDITIONAL VESSEL  04/30/2021   IR ANGIOGRAM SELECTIVE EACH ADDITIONAL VESSEL  04/30/2021   IR ANGIOGRAM SELECTIVE EACH ADDITIONAL VESSEL  05/24/2021   IR ANGIOGRAM VISCERAL SELECTIVE  04/30/2021   IR ANGIOGRAM VISCERAL SELECTIVE  04/30/2021   IR EMBO ARTERIAL NOT HEMORR HEMANG INC GUIDE ROADMAPPING  04/30/2021   IR EMBO TUMOR ORGAN ISCHEMIA INFARCT INC GUIDE ROADMAPPING  05/24/2021   IR RADIOLOGIST EVAL & MGMT  03/03/2021   IR US GUIDE VASC ACCESS RIGHT  04/30/2021   IR US GUIDE VASC ACCESS RIGHT  05/24/2021   LUMBAR [place] SURGERY  1994; ~ 1995; ~ [place]   nuclear stress test  10/19/2004   No ischemia   PERMANENT PACEMAKER INSERTION  01/08/2012   CHB   PERMANENT PACEMAKER INSERTION N/A 01/09/2012   Procedure: PERMANENT PACEMAKER INSERTION;  Surgeon: Sanda Klein, MD;  Location: [facility] CATH LAB;  Service: Cardiovascular;  Laterality: N/A;   POSTERIOR FUSION LUMBAR SPINE  1999   L4-5   SPINAL CORD STIMULATOR IMPLANT  2006   SPINAL CORD STIMULATOR REMOVAL N/A  01/27/2015   Procedure: LUMBAR SPINAL CORD STIMULATOR REMOVAL;  Surgeon: Kristeen Miss, MD;  Location: [facility] NEURO ORS;  Service: Neurosurgery;  Laterality: N/A;  LUMBAR SPINAL CORD STIMULATOR REMOVAL   TONSILLECTOMY AND ADENOIDECTOMY  1992   US ECHOCARDIOGRAPHY  12/28/2011   mild LVH,mild mitral annulara ca+,mild MR   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 Use as instructed to check blood sugar 2 times daily     ciclopirox (PENLAC) 8 % solution APPLY TOPICALLY AT BEDTIME. APPLY OVER NAIL AND SURROUNDING SKIN. APPLY DAILY OVER PREVIOUS COAT. AFTER SEVEN (7) DAYS, MAY REMOVE WITH ALCOHOL AND CONTINUE CYCLE. 6.6 mL 0   Continuous  Blood Gluc Receiver (DEXCOM G6 RECEIVER) DEVI Use as instruct to check blood sugar daily 1 each 0   Continuous Blood Gluc Sensor (DEXCOM G6 SENSOR) MISC 1 Device by Does not apply route as directed. 9 each 3   Continuous Blood Gluc Transmit (DEXCOM G6 TRANSMITTER) MISC 1 Device by Does not apply route as directed. 1 each 3   dapagliflozin propanediol (FARXIGA) 10 MG TABS tablet Take 1 tablet (10 mg total) by mouth daily. 90 tablet 3   diazepam (VALIUM) 10 MG tablet Take 1 tablet by mouth every 8 (eight) hours as needed (muscle spasms).     diltiazem (CARDIZEM CD) 300 MG 24 hr capsule TAKE 1 CAPSULE BY MOUTH EVERY DAY 90 capsule 3   escitalopram (LEXAPRO) 10 MG tablet Take 10 mg by mouth daily.     flecainide (TAMBOCOR) 100 MG tablet TAKE 1 TABLET BY MOUTH TWICE A DAY 180 tablet 3   furosemide (LASIX) 40 MG tablet Take 40 mg by mouth daily as needed for fluid or edema.     Insulin Aspart FlexPen (NOVOLOG) 100 UNIT/ML Max daily 50 units 45 mL 4   Insulin Degludec FlexTouch 100 UNIT/ML SOPN Inject 32 Units into the skin daily. 45 mL 3    Insulin Pen Needle 31G X 5 MM MISC 1 Device by Does not apply route in the morning, at noon, in the evening, and at bedtime. 400 each 3   levothyroxine (SYNTHROID, LEVOTHROID) 112 MCG tablet TAKE 1 TABLET BY MOUTH EVERY MORNING BEFORE BREAKFAST 30 tablet 0   metFORMIN (GLUCOPHAGE) 1000 MG tablet Take 1 tablet (1,000 mg total) by mouth 2 (two) times daily with a meal. 180 tablet 3   Omega-3 Fatty Acids (FISH OIL) 1000 MG CAPS Take 1,000-2,000 mg by mouth See admin instructions. Take 2000mg  in the AM and 1000mg  in the PM     oxymetazoline (AFRIN) 0.05 % nasal spray Place 1 spray into both nostrils 2 (two) times daily as needed for congestion. Sinex brand     pantoprazole (PROTONIX) 40 MG tablet Take 1 tablet (40 mg total) by mouth daily. 90 tablet 3   Polyethyl Glycol-Propyl Glycol (SYSTANE) 0.4-0.3 % SOLN Place 1 drop into both eyes every 6 (six) hours as needed (dry eyes).     propranolol (INDERAL) 20 MG tablet TAKE 1 TABLET BY MOUTH TWICE A DAY 180 tablet 1   rivaroxaban (XARELTO) 20 MG TABS tablet TAKE 1 TABLET BY MOUTH EVERY DAY WITH DINNER 90 tablet 0   simvastatin (ZOCOR) 20 MG tablet TAKE 1 TABLET BY MOUTH EVERY DAY IN THE EVENING 90 tablet 3   spironolactone (ALDACTONE) 50 MG tablet TAKE 1 TABLET BY MOUTH TWICE A DAY 180 tablet 1   No facility-administered medications prior to visit.     Allergies:   Nitroglycerin   Social History   Socioeconomic History   Marital status: Married    Spouse name: Not on file   Number of children: Not on file   Years of education: Not on file   Highest education level: Not on file  Occupational History   Not on file  Tobacco Use   Smoking status: Every Day    Packs/day: 0.50    Years: 45.00    Pack years: 22.50    Types: Cigarettes    Start date: 04/11/1968   Smokeless tobacco: Never   Tobacco comments:    Quit x 8 months this time  Vaping Use   Vaping Use: Never used  Substance and  Sexual Activity   Alcohol use: No    Alcohol/week: 0.0  standard drinks    Comment: "quit alcohol 2011" Previously drinking socially about twice per month   Drug use: No   Sexual activity: Never  Other Topics Concern   Not on file  Social History Narrative   Lives with wife in a one story home.  Has 3 children.     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     Education: some college.   Social Determinants of Health   Financial Resource Strain: Not on file  Food Insecurity: Not on file  Transportation Needs: Not on file  Physical Activity: Not on file  Stress: Not on file  Social Connections: Not on file     Family History:  The patient's family history includes Arrhythmia in his father; Cancer in his mother; Crohn's disease in his daughter; Diabetes in his maternal grandmother; Other in his father; Ovarian cancer in his mother; Stroke in his brother, brother, and sister.   Review of Systems:    Please see the history of present illness.     All other systems reviewed and are otherwise negative except as noted above.   Physical Exam:    VS:  BP 122/74    Pulse 62    Ht 5\' 10"  (1.778 m)    Wt 225 lb 6.4 oz (102.2 kg)    SpO2 96%    BMI 32.34 kg/m    General: Well developed, well nourished,male appearing in no acute distress. Head: Normocephalic, atraumatic. Neck: No carotid bruits. JVD not elevated.  Lungs: Respirations regular and unlabored, without wheezes or rales.  Heart: Regular rate and rhythm. No S3 or S4.  No murmur, no rubs, or gallops appreciated. Abdomen: Appears non-distended. No obvious abdominal masses. Msk:  Strength and tone appear normal for age. No obvious joint deformities or effusions. Extremities: No clubbing or cyanosis. No pitting edema.  Distal pedal pulses are 2+ bilaterally. Neuro: Alert and oriented X 3. Moves all extremities spontaneously. No focal deficits noted. Psych:  Responds to questions appropriately with a normal affect. Skin: No rashes or lesions noted  Wt Readings from Last 3 Encounters:   06/01/21 225 lb 6.4 oz (102.2 kg)  04/30/21 230 lb (104.3 kg)  02/18/21 225 lb (102.1 kg)     Studies/Labs Reviewed:   EKG:  EKG is ordered today.  The ekg ordered today demonstrates A-paced rhythm, heart rate 64.  Recent Labs: 12/16/2020: Magnesium 2.0; TSH 1.350 05/24/2021: ALT 46; BUN 28; Creatinine, Ser 0.64; Hemoglobin 17.9; Platelets 160; Potassium 4.7; Sodium 133   Lipid Panel    Component Value Date/Time   CHOL 155 12/16/2020 0826   TRIG 195 (H) 12/16/2020 0826   HDL 46 12/16/2020 0826   CHOLHDL 3.4 12/16/2020 0826   CHOLHDL 3.7 12/11/2015 0753   VLDL 55 (H) 12/11/2015 0753   LDLCALC 76 12/16/2020 0826    Additional studies/ records that were reviewed today include:   NST: 03/2021   Poor study quality due to diaphragmatic attenuation and extracardiac tracer uptake   The study appears normal. The study is low risk.   No ST deviation was noted.   LV perfusion is likely normal. There is a mild mid-to-apical anterior defect that appears worse at rest than with stress with normal wall motion in that region most consistent with artifact.   Left ventricular function is normal. End diastolic cavity size is normal.  Assessment:    1. Paroxysmal atrial fibrillation (HCC)  2. Sinus node dysfunction (HCC)   3. Essential hypertension   4. Cirrhosis of liver without ascites, unspecified hepatic cirrhosis type ([place])      Plan:   In order of problems listed above:  1. Paroxysmal Atrial Flutter - He is s/p ablation in 07/2013. He does experience brief palpitations but reports the episodes are usually triggered by increased caffeine consumption. He remains on Flecainide 100 mg twice daily along with Cardizem CD 300 mg daily and Propanolol 20 mg twice daily. - Remains on Xarelto 20mg  daily for anticoagulation and denies any evidence of active bleeding. This is the appropriate dose given his calculated creatinine clearance. Hgb was stable at 17.9 and platelets at 160 K when  checked on 05/24/2021.  2. Symptomatic Bradycardia - He is s/p St. Jude PPM placement in 12/2011. Followed by Dr. Lovena Le and most recent interrogation in 02/2021 showed his device was functioning normally. He does have scheduled follow-up with EP next month.  3. HTN - His blood pressure is well-controlled at 122/74 during today's visit. Continue current medication regimen with Cardizem CD 300 mg daily, Propanolol 20 mg twice daily and Spironolactone 50 mg twice daily.  4. NASH Cirrhosis/Hepatocellular Carcinoma - He is followed by GI and recently underwent radiation treatment as outlined above. He remains on Propanolol 20 mg twice daily and Spironolactone 50 mg twice daily.   Medication Adjustments/Labs and Tests Ordered: Current medicines are reviewed at length with the patient today.  Concerns regarding medicines are outlined above.  Medication changes, Labs and Tests ordered today are listed in the Patient Instructions below. Patient Instructions  Medication Instructions:  Your physician recommends that you continue on your current medications as directed. Please refer to the Current Medication list given to you today.  *If you need a refill on your cardiac medications before your next appointment, please call your pharmacy*   Lab Work: NONE   If you have labs (blood work) drawn today and your tests are completely normal, you will receive your results only by: [place] (if you have MyChart) OR A paper copy in the mail If you have any lab test that is abnormal or we need to change your treatment, we will call you to review the results.   Testing/Procedures: NONE    Follow-Up: At [facility], you and your health needs are our priority.  As part of our continuing mission to provide you with exceptional heart care, we have created designated Provider Care Teams.  These Care Teams include your primary Cardiologist (physician) and Advanced Practice Providers (APPs -   Physician Assistants and Nurse Practitioners) who all work together to provide you with the care you need, when you need it.  We recommend signing up for the patient portal called "MyChart".  Sign up information is provided on this After Visit Summary.  MyChart is used to connect with patients for Virtual Visits (Telemedicine).  Patients are able to view lab/test results, encounter notes, upcoming appointments, etc.  Non-urgent messages can be sent to your provider as well.   To learn more about what you can do with MyChart, go to [email or web address].    Your next appointment:   6 month(s)  The format for your next appointment:   In Person  Provider:   Carlyle Dolly, MD    Other Instructions Thank you for choosing [facility]!      Signed, Erma Heritage, PA-C  06/01/2021 4:10 PM    [facility] [address]  [place], [place] [address] Phone: [phone] Fax: [phone]

## 2021-06-08 ENCOUNTER — Ambulatory Visit (INDEPENDENT_AMBULATORY_CARE_PROVIDER_SITE_OTHER): Payer: Medicare HMO

## 2021-06-08 DIAGNOSIS — I495 Sick sinus syndrome: Secondary | ICD-10-CM | POA: Diagnosis not present

## 2021-06-10 ENCOUNTER — Telehealth: Payer: Self-pay

## 2021-06-10 LAB — CUP PACEART REMOTE DEVICE CHECK
Battery Remaining Longevity: 10 mo
Battery Remaining Percentage: 8 %
Battery Voltage: 2.74 V
Brady Statistic AP VP Percent: 1 %
Brady Statistic AP VS Percent: 55 %
Brady Statistic AS VP Percent: 1 %
Brady Statistic AS VS Percent: 44 %
Brady Statistic RA Percent Paced: 55 %
Brady Statistic RV Percent Paced: 1 %
Date Time Interrogation Session: 20230301233625
Implantable Lead Implant Date: 20130920
Implantable Lead Implant Date: 20130920
Implantable Lead Location: 753859
Implantable Lead Location: 753860
Implantable Pulse Generator Implant Date: 20130920
Lead Channel Impedance Value: 300 Ohm
Lead Channel Impedance Value: 440 Ohm
Lead Channel Pacing Threshold Amplitude: 0.75 V
Lead Channel Pacing Threshold Amplitude: 1 V
Lead Channel Pacing Threshold Pulse Width: 0.4 ms
Lead Channel Pacing Threshold Pulse Width: 1 ms
Lead Channel Sensing Intrinsic Amplitude: 0.9 mV
Lead Channel Sensing Intrinsic Amplitude: 5.1 mV
Lead Channel Setting Pacing Amplitude: 2 V
Lead Channel Setting Pacing Amplitude: 2.5 V
Lead Channel Setting Pacing Pulse Width: 1 ms
Lead Channel Setting Sensing Sensitivity: 1 mV
Pulse Gen Model: 2210
Pulse Gen Serial Number: 7393982

## 2021-06-10 NOTE — Telephone Encounter (Signed)
Increased PPM checks to monthly battery checks. Patient called, made aware. LW ?

## 2021-06-16 NOTE — Progress Notes (Signed)
Remote pacemaker transmission.   

## 2021-06-22 ENCOUNTER — Ambulatory Visit: Payer: Medicare HMO | Admitting: Internal Medicine

## 2021-06-22 ENCOUNTER — Encounter: Payer: Self-pay | Admitting: Internal Medicine

## 2021-06-22 VITALS — BP 132/62 | HR 60 | Ht 70.0 in | Wt 227.0 lb

## 2021-06-22 DIAGNOSIS — K7460 Unspecified cirrhosis of liver: Secondary | ICD-10-CM | POA: Diagnosis not present

## 2021-06-22 DIAGNOSIS — I455 Other specified heart block: Secondary | ICD-10-CM | POA: Diagnosis not present

## 2021-06-22 DIAGNOSIS — E6609 Other obesity due to excess calories: Secondary | ICD-10-CM | POA: Diagnosis not present

## 2021-06-22 DIAGNOSIS — I8500 Esophageal varices without bleeding: Secondary | ICD-10-CM | POA: Diagnosis not present

## 2021-06-22 DIAGNOSIS — C220 Liver cell carcinoma: Secondary | ICD-10-CM | POA: Diagnosis not present

## 2021-06-22 DIAGNOSIS — Z6832 Body mass index (BMI) 32.0-32.9, adult: Secondary | ICD-10-CM | POA: Diagnosis not present

## 2021-06-22 DIAGNOSIS — K7581 Nonalcoholic steatohepatitis (NASH): Secondary | ICD-10-CM | POA: Diagnosis not present

## 2021-06-22 NOTE — Patient Instructions (Signed)
Medication Instructions:  Your physician recommends that you continue on your current medications as directed. Please refer to the Current Medication list given to you today.  *If you need a refill on your cardiac medications before your next appointment, please call your pharmacy*   Lab Work: NONE   If you have labs (blood work) drawn today and your tests are completely normal, you will receive your results only by: . MyChart Message (if you have MyChart) OR . A paper copy in the mail If you have any lab test that is abnormal or we need to change your treatment, we will call you to review the results.   Testing/Procedures: NONE    Follow-Up: At [facility], you and your health needs are our priority.  As part of our continuing mission to provide you with exceptional heart care, we have created designated Provider Care Teams.  These Care Teams include your primary Cardiologist (physician) and Advanced Practice Providers (APPs -  Physician Assistants and Nurse Practitioners) who all work together to provide you with the care you need, when you need it.  We recommend signing up for the patient portal called "MyChart".  Sign up information is provided on this After Visit Summary.  MyChart is used to connect with patients for Virtual Visits (Telemedicine).  Patients are able to view lab/test results, encounter notes, upcoming appointments, etc.  Non-urgent messages can be sent to your provider as well.   To learn more about what you can do with MyChart, go to [email or web address].    Your next appointment:   1 month(s)  The format for your next appointment:   In Person  Provider:   Gregg Taylor, MD   Other Instructions Thank you for choosing [facility]!    

## 2021-06-22 NOTE — Progress Notes (Signed)
? ? ? ? ?HPI ?[name] returns today for ongoing follow-up of sinus node dysfunction and paroxysmal atrial fibrillation, status post permanent pacemaker insertion. He also has a history of non-obstructive coronary artery disease and peripheral vascular disease. He denies chest pain, sob or syncope. He has some palpitations which are mild.  ?Allergies  ?Allergen Reactions  ? Nitroglycerin Hives, Swelling and Rash  ? ? ? ?Current Outpatient Medications  ?Medication Sig Dispense Refill  ? ACCU-CHEK GUIDE test strip TEST TWICE A DAY BEFORE MEALS 200 strip 6  ? Blood Glucose Monitoring Suppl (ACCU-CHEK AVIVA) device by Other route. Use as instructed to check blood sugar 2 times daily    ? ciclopirox (PENLAC) 8 % solution APPLY TOPICALLY AT BEDTIME. APPLY OVER NAIL AND SURROUNDING SKIN. APPLY DAILY OVER PREVIOUS COAT. AFTER SEVEN (7) DAYS, MAY REMOVE WITH ALCOHOL AND CONTINUE CYCLE. 6.6 mL 0  ? Continuous Blood Gluc Receiver (DEXCOM G6 RECEIVER) DEVI Use as instruct to check blood sugar daily 1 each 0  ? Continuous Blood Gluc Sensor (DEXCOM G6 SENSOR) MISC 1 Device by Does not apply route as directed. 9 each 3  ? Continuous Blood Gluc Transmit (DEXCOM G6 TRANSMITTER) MISC 1 Device by Does not apply route as directed. 1 each 3  ? dapagliflozin propanediol (FARXIGA) 10 MG TABS tablet Take 1 tablet (10 mg total) by mouth daily. 90 tablet 3  ? diazepam (VALIUM) 10 MG tablet Take 1 tablet by mouth every 8 (eight) hours as needed (muscle spasms).    ? diltiazem (CARDIZEM CD) 300 MG 24 hr capsule TAKE 1 CAPSULE BY MOUTH EVERY DAY 90 capsule 3  ? escitalopram (LEXAPRO) 10 MG tablet Take 10 mg by mouth daily.    ? flecainide (TAMBOCOR) 100 MG tablet TAKE 1 TABLET BY MOUTH TWICE A DAY 180 tablet 3  ? furosemide (LASIX) 40 MG tablet Take 40 mg by mouth daily as needed for fluid or edema.    ? Insulin Aspart FlexPen (NOVOLOG) 100 UNIT/ML Max daily 50 units 45 mL 4  ? Insulin Degludec FlexTouch 100 UNIT/ML SOPN Inject 32 Units into  the skin daily. 45 mL 3  ? Insulin Pen Needle 31G X 5 MM MISC 1 Device by Does not apply route in the morning, at noon, in the evening, and at bedtime. 400 each 3  ? levothyroxine (SYNTHROID, LEVOTHROID) 112 MCG tablet TAKE 1 TABLET BY MOUTH EVERY MORNING BEFORE BREAKFAST 30 tablet 0  ? metFORMIN (GLUCOPHAGE) 1000 MG tablet Take 1 tablet (1,000 mg total) by mouth 2 (two) times daily with a meal. 180 tablet 3  ? Omega-3 Fatty Acids (FISH OIL) 1000 MG CAPS Take 1,000-2,000 mg by mouth See admin instructions. Take '2000mg'$  in the AM and '1000mg'$  in the PM    ? oxymetazoline (AFRIN) 0.05 % nasal spray Place 1 spray into both nostrils 2 (two) times daily as needed for congestion. Sinex brand    ? pantoprazole (PROTONIX) 40 MG tablet Take 1 tablet (40 mg total) by mouth daily. 90 tablet 3  ? Polyethyl Glycol-Propyl Glycol (SYSTANE) 0.4-0.3 % SOLN Place 1 drop into both eyes every 6 (six) hours as needed (dry eyes).    ? propranolol (INDERAL) 20 MG tablet TAKE 1 TABLET BY MOUTH TWICE A DAY 180 tablet 1  ? rivaroxaban (XARELTO) 20 MG TABS tablet TAKE 1 TABLET BY MOUTH EVERY DAY WITH DINNER 90 tablet 0  ? simvastatin (ZOCOR) 20 MG tablet TAKE 1 TABLET BY MOUTH EVERY DAY IN THE EVENING 90  tablet 3  ? spironolactone (ALDACTONE) 50 MG tablet TAKE 1 TABLET BY MOUTH TWICE A DAY 180 tablet 1  ? ?No current facility-administered medications for this visit.  ? ? ? ?Past Medical History:  ?Diagnosis Date  ? Anginal pain ([place])   ? Arthritis   ? "back; fingers" (01/06/2012)  ? Atrial flutter ([place])   ? s/p EPS +RF ablation of typical atrial flutter April 2015  ? Cancer [facility])   ? skin cancer  ? CHB (complete heart block) (HCC)   ? CHF (congestive heart failure) ([place]) 01/06/2012  ? Chronic lower back pain   ? Cirrhosis ([place])   ? NASH-Hep A and B immune  ? Coughing up blood   ? "comes from my throat" (01/06/2012)  ? DDD (degenerative disc disease), lumbar   ? Depressed   ? Difficult intubation   ? Eschmann stylet used in 2002 and 2007; "trouble  waking up afterwards" (01/06/2012)  ? Emphysema   ? Fatty liver disease, nonalcoholic   ? GERD (gastroesophageal reflux disease)   ? H/O hiatal hernia   ? Headache   ? History of esophageal varices   ? Hypertension   ? Hypothyroidism   ? Liver cancer ([place])   ? Orthostatic dizziness   ? Pacemaker   ? Pneumonia 11/2014  ? Presence of permanent cardiac pacemaker 9/292013  ? St.Jude  ? Sinus pause 01/06/2012  ? 5.2 seconds  ? Sleep apnea   ? "don't wear mask" (01/06/2012)  ? Stroke [facility])   ? pt states that he might have had a stroke not sure  ? Type II diabetes mellitus ([place])   ? Varicose vein   ? of esophagus  ? ? ?ROS: ? ? All systems reviewed and negative except as noted in the HPI. ? ? ?Past Surgical History:  ?Procedure Laterality Date  ? ATRIAL FLUTTER ABLATION N/A 07/10/2013  ? Procedure: ATRIAL FLUTTER ABLATION;  Surgeon: Evans Lance, MD;  Location: [facility] CATH LAB;  Service: Cardiovascular;  Laterality: N/A;  ? BACK SURGERY    ? CHOLECYSTECTOMY  1993  ? COLONOSCOPY  11/08/2004  ? SAY:[id number] rectum, colon, TI.  ? COLONOSCOPY N/A 05/28/2014  ? Dr. Gala Romney: Redundant colon. single colonic polyp removed as described above. Tubular adenoma  ? ESOPHAGEAL DILATION N/A 05/28/2014  ? Procedure: ESOPHAGEAL DILATION;  Surgeon: Daneil Dolin, MD;  [facility];  Service: Endoscopy;  Laterality: N/A;  ? ESOPHAGOGASTRODUODENOSCOPY  11/08/2004  ? UXN:ATFTDD esophageal erosions consistent with erosive reflux esophagitis/Areas of hemorrhage and nodularity of the fundal mucosa of uncertain significance, biopsied.  Small hiatal hernia, otherwise normal stomach  ? ESOPHAGOGASTRODUODENOSCOPY  2010  ? Dr. Gala Romney: 3 columns Grade 1 varices, erosive esophagitis, HH, portal gastropathy, normal D1, D2  ? ESOPHAGOGASTRODUODENOSCOPY N/A 05/28/2014  ? Dr. Gala Romney: MIld erosive reflux esophagitis. Grade 1 esophageal varices. Patent esophagus. No dilation performed. Hiatal hernia.   ? ESOPHAGOGASTRODUODENOSCOPY (EGD) WITH ESOPHAGEAL DILATION  N/A 02/14/2013  ? UKG:URKYH 1 esophageal varices. Abnormal distal esophagus/status post biopsy after Maloney dilation. Portal gastropathy. Antral erosions-status post biopsy. path negative for H.pylori, benign path.  ? IR 3D INDEPENDENT WKST  05/24/2021  ? IR ANGIOGRAM SELECTIVE EACH ADDITIONAL VESSEL  04/30/2021  ? IR ANGIOGRAM SELECTIVE EACH ADDITIONAL VESSEL  04/30/2021  ? IR ANGIOGRAM SELECTIVE EACH ADDITIONAL VESSEL  04/30/2021  ? IR ANGIOGRAM SELECTIVE EACH ADDITIONAL VESSEL  04/30/2021  ? IR ANGIOGRAM SELECTIVE EACH ADDITIONAL VESSEL  04/30/2021  ? IR ANGIOGRAM SELECTIVE EACH ADDITIONAL VESSEL  05/24/2021  ?  IR ANGIOGRAM VISCERAL SELECTIVE  04/30/2021  ? IR ANGIOGRAM VISCERAL SELECTIVE  04/30/2021  ? IR EMBO ARTERIAL NOT HEMORR HEMANG INC GUIDE ROADMAPPING  04/30/2021  ? IR EMBO TUMOR ORGAN ISCHEMIA INFARCT INC GUIDE ROADMAPPING  05/24/2021  ? IR RADIOLOGIST EVAL & MGMT  03/03/2021  ? IR US GUIDE VASC ACCESS RIGHT  04/30/2021  ? IR US GUIDE VASC ACCESS RIGHT  05/24/2021  ? Center SURGERY  1994; ~ 1995; ~ 1996  ? NASAL SEPTUM SURGERY  1992  ? nuclear stress test  10/19/2004  ? No ischemia  ? PERMANENT PACEMAKER INSERTION  01/08/2012  ? CHB  ? PERMANENT PACEMAKER INSERTION N/A 01/09/2012  ? Procedure: PERMANENT PACEMAKER INSERTION;  Surgeon: Sanda Klein, MD;  Location: [place] CATH LAB;  Service: Cardiovascular;  Laterality: N/A;  ? [place]  ? L4-5  ? SPINAL CORD STIMULATOR IMPLANT  2006  ? SPINAL CORD STIMULATOR REMOVAL N/A 01/27/2015  ? Procedure: LUMBAR SPINAL CORD STIMULATOR REMOVAL;  Surgeon: Kristeen Miss, MD;  Location: [place] NEURO ORS;  Service: Neurosurgery;  Laterality: N/A;  LUMBAR SPINAL CORD STIMULATOR REMOVAL  ? TONSILLECTOMY AND ADENOIDECTOMY  1992  ? US ECHOCARDIOGRAPHY  12/28/2011  ? mild LVH,mild mitral annulara ca+,mild MR  ? Warthin's tumor excision  1990's  ? right  ? ? ? ?Family History  ?Problem Relation Age of Onset  ? Cancer Mother   ?     Deceased, 75  ? Ovarian cancer Mother    ? Arrhythmia Father   ? Other Father   ?     Deceased 15  ? Stroke Brother   ? Stroke Brother   ? Stroke Sister   ? Crohn's disease Daughter   ? Diabetes Maternal Grandmother   ? Colon cancer Neg H

## 2021-07-06 ENCOUNTER — Ambulatory Visit: Payer: Medicare HMO | Admitting: Gastroenterology

## 2021-07-06 ENCOUNTER — Encounter: Payer: Self-pay | Admitting: Gastroenterology

## 2021-07-06 ENCOUNTER — Other Ambulatory Visit: Payer: Self-pay

## 2021-07-06 VITALS — BP 148/62 | HR 74 | Temp 97.5°F | Ht 70.0 in | Wt 226.0 lb

## 2021-07-06 DIAGNOSIS — K7460 Unspecified cirrhosis of liver: Secondary | ICD-10-CM | POA: Diagnosis not present

## 2021-07-06 DIAGNOSIS — Z86010 Personal history of colonic polyps: Secondary | ICD-10-CM

## 2021-07-06 DIAGNOSIS — K921 Melena: Secondary | ICD-10-CM

## 2021-07-06 NOTE — Patient Instructions (Addendum)
We will continue to follow your treatment by interventional radiology and [facility]. ?Plans for upper endoscopy and colonoscopy in the near future.  Please see separate instructions. ?Call if you have recurrent black stools. ?Continue pantoprazole 40 mg daily. ?Continue propanolol 20 mg twice daily. ?Return to the office in 6 months. ?

## 2021-07-06 NOTE — Progress Notes (Signed)
? ? ? ?GI Office Note   ? ?Referring Provider: Sharilyn Sites, MD ?Primary Care Physician:  Sharilyn Sites, MD  ?Primary Gastroenterologist: Garfield Cornea, MD ? ? ?Chief Complaint  ? ?Chief Complaint  ?Patient presents with  ? Colonoscopy  ? ? ? ?History of Present Illness  ? ?Ralph [name] is a 71 y.o. male presenting today for follow-up.  Last seen March 2022.  He has a history of [place] cirrhosis, adenomatous colon polyps.  History of grade 1 esophageal varices remotely, no prior variceal hemorrhage, currently maintained on Inderal.  History of mild oropharyngeal dysphagia, seen by speech therapy in the past for dysfunctional hypopharyngeal muscles.  He never scheduled colonoscopy after last office visit.  In the interim he was diagnosed with biopsy-proven hepatocellular carcinoma and has been undergoing treatment via IR (Y-90 segmentectomy radioembolization). ? ?Patient continues to follow with [place].  Patient saw Dr. Zollie Scale on March 14.  Patient requested aggressive treatment for his hepatocellular carcinoma, he has an office visit April 6 in [place] with hepatobiliary surgery colleagues for consideration of laparoscopic liver interrogation and microwave ablation of any residual tumor if detected.  According to Dr. Precious Gilding note, "I am not terribly enthusiastic about working this gentleman up for liver transplantation given his advanced age and ongoing tobacco use in the setting of emphysema. Smoking cessation is advised today. He can further discuss with his primary care physician. In addition as he indicates his wife is not very mobile right now somewhat exactly clear who his support would be." ? ?Today: From a GI standpoint feeling okay.  Denies any constipation or diarrhea.  States he had a black stool 1 day last week.  He typically sees 1 every couple of months.  Denies any Pepto-Bismol use.  No fresh blood per rectum.  No change in energy level.  Denies any shortness of breath or  lightheadedness.  He continues to have right upper quadrant pain, constant.  Unrelated to BMs or meals.  At times can be severe.  States he was told that "the liver has no nerves" so he should not have pain related to his tumor.  No nausea or vomiting.  No heartburn.  Stable oropharyngeal dysphagia.  No symptoms suggestive of esophageal dysphagia. ?  ? ?Medications  ? ?Current Outpatient Medications  ?Medication Sig Dispense Refill  ? ACCU-CHEK GUIDE test strip TEST TWICE A DAY BEFORE MEALS 200 strip 6  ? Blood Glucose Monitoring Suppl (ACCU-CHEK AVIVA) device by Other route. Use as instructed to check blood sugar 2 times daily    ? ciclopirox (PENLAC) 8 % solution APPLY TOPICALLY AT BEDTIME. APPLY OVER NAIL AND SURROUNDING SKIN. APPLY DAILY OVER PREVIOUS COAT. AFTER SEVEN (7) DAYS, MAY REMOVE WITH ALCOHOL AND CONTINUE CYCLE. 6.6 mL 0  ? Continuous Blood Gluc Receiver (DEXCOM G6 RECEIVER) DEVI Use as instruct to check blood sugar daily 1 each 0  ? Continuous Blood Gluc Sensor (DEXCOM G6 SENSOR) MISC 1 Device by Does not apply route as directed. 9 each 3  ? Continuous Blood Gluc Transmit (DEXCOM G6 TRANSMITTER) MISC 1 Device by Does not apply route as directed. 1 each 3  ? dapagliflozin propanediol (FARXIGA) 10 MG TABS tablet Take 1 tablet (10 mg total) by mouth daily. 90 tablet 3  ? diazepam (VALIUM) 10 MG tablet Take 1 tablet by mouth every 8 (eight) hours as needed (muscle spasms).    ? diltiazem (CARDIZEM CD) 300 MG 24 hr capsule TAKE 1 CAPSULE BY MOUTH EVERY  DAY 90 capsule 3  ? escitalopram (LEXAPRO) 10 MG tablet Take 10 mg by mouth daily.    ? flecainide (TAMBOCOR) 100 MG tablet TAKE 1 TABLET BY MOUTH TWICE A DAY 180 tablet 3  ? furosemide (LASIX) 40 MG tablet Take 40 mg by mouth daily as needed for fluid or edema.    ? Insulin Aspart FlexPen (NOVOLOG) 100 UNIT/ML Max daily 50 units 45 mL 4  ? Insulin Degludec FlexTouch 100 UNIT/ML SOPN Inject 32 Units into the skin daily. 45 mL 3  ? Insulin Pen Needle 31G X 5  MM MISC 1 Device by Does not apply route in the morning, at noon, in the evening, and at bedtime. 400 each 3  ? levothyroxine (SYNTHROID, LEVOTHROID) 112 MCG tablet TAKE 1 TABLET BY MOUTH EVERY MORNING BEFORE BREAKFAST 30 tablet 0  ? metFORMIN (GLUCOPHAGE) 1000 MG tablet Take 1 tablet (1,000 mg total) by mouth 2 (two) times daily with a meal. 180 tablet 3  ? Omega-3 Fatty Acids (FISH OIL) 1000 MG CAPS Take 1,000-2,000 mg by mouth See admin instructions. Take '2000mg'$  in the AM and '1000mg'$  in the PM    ? oxymetazoline (AFRIN) 0.05 % nasal spray Place 1 spray into both nostrils 2 (two) times daily as needed for congestion. Sinex brand    ? pantoprazole (PROTONIX) 40 MG tablet Take 1 tablet (40 mg total) by mouth daily. 90 tablet 3  ? Polyethyl Glycol-Propyl Glycol (SYSTANE) 0.4-0.3 % SOLN Place 1 drop into both eyes every 6 (six) hours as needed (dry eyes).    ? propranolol (INDERAL) 20 MG tablet TAKE 1 TABLET BY MOUTH TWICE A DAY 180 tablet 1  ? rivaroxaban (XARELTO) 20 MG TABS tablet TAKE 1 TABLET BY MOUTH EVERY DAY WITH DINNER 90 tablet 0  ? simvastatin (ZOCOR) 20 MG tablet TAKE 1 TABLET BY MOUTH EVERY DAY IN THE EVENING 90 tablet 3  ? spironolactone (ALDACTONE) 50 MG tablet TAKE 1 TABLET BY MOUTH TWICE A DAY 180 tablet 1  ? ?No current facility-administered medications for this visit.  ? ? ?Allergies  ? ?Allergies as of 07/06/2021 - Review Complete 07/06/2021  ?Allergen Reaction Noted  ? Nitroglycerin Hives, Swelling, and Rash   ? ? ?Past Medical History  ? ?Past Medical History:  ?Diagnosis Date  ? Anginal pain ([place])   ? Arthritis   ? "back; fingers" (01/06/2012)  ? Atrial flutter ([place])   ? s/p EPS +RF ablation of typical atrial flutter April 2015  ? Cancer [facility])   ? skin cancer  ? CHB (complete heart block) (HCC)   ? CHF (congestive heart failure) ([place]) 01/06/2012  ? Chronic lower back pain   ? Cirrhosis ([place])   ? NASH-Hep A and B immune  ? Coughing up blood   ? "comes from my throat" (01/06/2012)  ? DDD  (degenerative disc disease), lumbar   ? Depressed   ? Difficult intubation   ? Eschmann stylet used in 2002 and 2007; "trouble waking up afterwards" (01/06/2012)  ? Emphysema   ? Fatty liver disease, nonalcoholic   ? GERD (gastroesophageal reflux disease)   ? H/O hiatal hernia   ? Headache   ? History of esophageal varices   ? Hypertension   ? Hypothyroidism   ? Liver cancer ([place])   ? Orthostatic dizziness   ? Pacemaker   ? Pneumonia 11/2014  ? Presence of permanent cardiac pacemaker 9/292013  ? St.Jude  ? Sinus pause 01/06/2012  ? 5.2 seconds  ? Sleep  apnea   ? "don't wear mask" (01/06/2012)  ? Stroke [facility])   ? pt states that he might have had a stroke not sure  ? Type II diabetes mellitus ([place])   ? Varicose vein   ? of esophagus  ? ? ?Past Surgical History  ? ?Past Surgical History:  ?Procedure Laterality Date  ? ATRIAL FLUTTER ABLATION N/A 07/10/2013  ? Procedure: ATRIAL FLUTTER ABLATION;  Surgeon: Evans Lance, MD;  Location[facility] C[facility] LAB;  Service: Cardiovascular;  Laterality: N/A;  ? BACK SURGERY    ? CHOLECYSTECTOMY  1993  ? COLONOSCOPY  11/08/2004  ? [name] rectum, colon, TI.  ? COLONOSCOPY N/A 05/28/2014  ? Dr. Gala Romney: Redundant colon. single colonic polyp removed as described above. Tubular adenoma  ? ESOPHAGEAL DILATION N/A 05/28/2014  ? Procedure: ESOPHAGEAL DILATION;  Surgeon: Daneil Dolin, MD;  [facility];  Service: Endoscopy;  Laterality: N/A;  ? ESOPHAGOGASTRODUODENOSCOPY  11/08/2004  ? WUJ:[id number] esophageal erosions consistent with erosive reflux esophagitis/Areas of hemorrhage and nodularity of the fundal mucosa of uncertain significance, biopsied.  Small hiatal hernia, otherwise normal stomach  ? ESOPHAGOGASTRODUODENOSCOPY  2010  ? Dr. Gala Romney: 3 columns Grade 1 varices, erosive esophagitis, HH, portal gastropathy, normal D1, D2  ? ESOPHAGOGASTRODUODENOSCOPY N/A 05/28/2014  ? Dr. Gala Romney: MIld erosive reflux esophagitis. Grade 1 esophageal varices. Patent esophagus. No dilation performed.  Hiatal hernia.   ? ESOPHAGOGASTRODUODENOSCOPY (EGD) WITH ESOPHAGEAL DILATION N/A 02/14/2013  ? NWG:[id number] 1 esophageal varices. Abnormal distal esophagus/status post biopsy after Maloney dilation. Portal gastropathy. Antra

## 2021-07-11 ENCOUNTER — Other Ambulatory Visit: Payer: Self-pay | Admitting: Cardiology

## 2021-07-11 ENCOUNTER — Other Ambulatory Visit: Payer: Self-pay | Admitting: Podiatry

## 2021-07-12 ENCOUNTER — Ambulatory Visit (INDEPENDENT_AMBULATORY_CARE_PROVIDER_SITE_OTHER): Payer: Medicare HMO

## 2021-07-12 DIAGNOSIS — I495 Sick sinus syndrome: Secondary | ICD-10-CM

## 2021-07-13 ENCOUNTER — Ambulatory Visit: Payer: Medicare HMO | Admitting: Podiatry

## 2021-07-13 ENCOUNTER — Encounter: Payer: Self-pay | Admitting: Podiatry

## 2021-07-13 DIAGNOSIS — B351 Tinea unguium: Secondary | ICD-10-CM | POA: Diagnosis not present

## 2021-07-13 DIAGNOSIS — M79675 Pain in left toe(s): Secondary | ICD-10-CM | POA: Diagnosis not present

## 2021-07-13 DIAGNOSIS — M79674 Pain in right toe(s): Secondary | ICD-10-CM | POA: Diagnosis not present

## 2021-07-13 DIAGNOSIS — E1142 Type 2 diabetes mellitus with diabetic polyneuropathy: Secondary | ICD-10-CM

## 2021-07-13 LAB — CUP PACEART REMOTE DEVICE CHECK
Battery Remaining Longevity: 9 mo
Battery Remaining Percentage: 8 %
Battery Voltage: 2.72 V
Brady Statistic AP VP Percent: 1 %
Brady Statistic AP VS Percent: 65 %
Brady Statistic AS VP Percent: 1 %
Brady Statistic AS VS Percent: 34 %
Brady Statistic RA Percent Paced: 65 %
Brady Statistic RV Percent Paced: 1 %
Date Time Interrogation Session: 20230403050936
Implantable Lead Implant Date: 20130920
Implantable Lead Implant Date: 20130920
Implantable Lead Location: 753859
Implantable Lead Location: 753860
Implantable Pulse Generator Implant Date: 20130920
Lead Channel Impedance Value: 300 Ohm
Lead Channel Impedance Value: 440 Ohm
Lead Channel Pacing Threshold Amplitude: 0.75 V
Lead Channel Pacing Threshold Amplitude: 0.75 V
Lead Channel Pacing Threshold Pulse Width: 0.4 ms
Lead Channel Pacing Threshold Pulse Width: 1 ms
Lead Channel Sensing Intrinsic Amplitude: 1.8 mV
Lead Channel Sensing Intrinsic Amplitude: 5.1 mV
Lead Channel Setting Pacing Amplitude: 2 V
Lead Channel Setting Pacing Amplitude: 2.5 V
Lead Channel Setting Pacing Pulse Width: 1 ms
Lead Channel Setting Sensing Sensitivity: 1 mV
Pulse Gen Model: 2210
Pulse Gen Serial Number: 7393982

## 2021-07-13 MED ORDER — CICLOPIROX 8 % EX SOLN: Freq: Every day | CUTANEOUS | 0 refills | Status: DC

## 2021-07-13 NOTE — Progress Notes (Signed)
?  Subjective:  ?Patient ID: Ralph [name], male    DOB: [birth year],   MRN: [id number] ? ?Chief Complaint  ?Patient presents with  ? Nail Problem  ?  nail fungus 3 mth f/u  ? ? ?71 y.o. male presents for thickened discolored nails and diabetic foot check. Patient relates his last A1c was 8.2 and was last seen in May by Dr. Hilma Favors  Relates pain in his toenails bilateral. . Denies any other pedal complaints. Denies n/v/f/c.  ? ?Past Medical History:  ?Diagnosis Date  ? Anginal pain ([place])   ? Arthritis   ? "back; fingers" (01/06/2012)  ? Atrial flutter ([place])   ? s/p EPS +RF ablation of typical atrial flutter April 2015  ? Cancer [facility])   ? skin cancer  ? CHB (complete heart block) (HCC)   ? CHF (congestive heart failure) ([place]) 01/06/2012  ? Chronic lower back pain   ? Cirrhosis ([place])   ? NASH-Hep A and B immune  ? Coughing up blood   ? "comes from my throat" (01/06/2012)  ? DDD (degenerative disc disease), lumbar   ? Depressed   ? Difficult intubation   ? Eschmann stylet used in 2002 and 2007; "trouble waking up afterwards" (01/06/2012)  ? Emphysema   ? Fatty liver disease, nonalcoholic   ? GERD (gastroesophageal reflux disease)   ? H/O hiatal hernia   ? Headache   ? History of esophageal varices   ? Hypertension   ? Hypothyroidism   ? Liver cancer ([place])   ? Orthostatic dizziness   ? Pacemaker   ? Pneumonia 11/2014  ? Presence of permanent cardiac pacemaker 9/292013  ? St.Jude  ? Sinus pause 01/06/2012  ? 5.2 seconds  ? Sleep apnea   ? "don't wear mask" (01/06/2012)  ? Stroke [facility])   ? pt states that he might have had a stroke not sure  ? Type II diabetes mellitus ([place])   ? Varicose vein   ? of esophagus  ? ? ?Objective:  ?Physical Exam: ?Vascular: DP/PT pulses 2/4 bilateral. CFT <3 seconds. Normal hair growth on digits. No edema.  ?Skin. No lacerations or abrasions bilateral feet. Nails 1-5 b/l are thickened discolored and elongated with subungual debris.  ?Musculoskeletal: MMT 5/5 bilateral lower extremities in DF, PF,  Inversion and Eversion. Deceased ROM in DF of ankle joint. Tender to nails 1-5 b/l ?Neurological: Sensation intact to light touch.  ? ?Assessment:  ? ?1. Type 2 diabetes mellitus with diabetic polyneuropathy, without long-term current use of insulin ([place])   ?2. Pain due to onychomycosis of toenail of left foot   ?3. Pain due to onychomycosis of toenail of right foot   ? ? ? ? ?Plan:  ?Patient was evaluated and treated and all questions answered. ?-Discussed and educated patient on diabetic foot care, especially with  ?regards to the vascular, neurological and musculoskeletal systems.  ?-Stressed the importance of good glycemic control and the detriment of not  ?controlling glucose levels in relation to the foot. ?-Discussed supportive shoes at all times and checking feet regularly. ?-Penlac refilled.   ?-Mechanically debrided all nails 1-5 bilateral using sterile nail nipper and filed with dremel without incident  ?-Answered all patient questions ?-Patient to return  in 3 months for at risk foot care ?-Patient advised to call the office if any problems or questions arise in the meantime. ? ? ?Lorenda Peck, DPM  ? ? ?

## 2021-07-14 ENCOUNTER — Telehealth: Payer: Self-pay | Admitting: *Deleted

## 2021-07-14 NOTE — Telephone Encounter (Signed)
LMOVM to call back to schedule TCS/EGD +/- VARICEAL BANDING, ASA 3/4, H/O difficult intubation  ?

## 2021-07-14 NOTE — Telephone Encounter (Signed)
Prescription signed 1 day ago. ?

## 2021-07-15 ENCOUNTER — Encounter: Payer: Self-pay | Admitting: *Deleted

## 2021-07-15 DIAGNOSIS — K7460 Unspecified cirrhosis of liver: Secondary | ICD-10-CM | POA: Diagnosis not present

## 2021-07-15 DIAGNOSIS — C220 Liver cell carcinoma: Secondary | ICD-10-CM | POA: Diagnosis not present

## 2021-07-15 MED ORDER — PEG 3350-KCL-NA BICARB-NACL 420 G PO SOLR: ORAL | 0 refills | Status: DC

## 2021-07-15 NOTE — Telephone Encounter (Signed)
Spoke with pt and spouse. He has been scheduled for 5/25 at 9:15am. Aware will mail prep instructions/pre-op appt. Will send prep rx to pharmacy. ? ?PA approved via cohere for TCS. Auth# [id number], DOS: 09/02/21-12/01/21. ?EGD approved. Auth# [id number], DOS: 08/31/21-12/01/21 ?

## 2021-07-15 NOTE — Addendum Note (Signed)
Addended by: Cheron Every on: 07/15/2021 08:25 AM ? ? Modules accepted: Orders ? ?

## 2021-07-27 NOTE — Progress Notes (Signed)
Remote pacemaker transmission.   

## 2021-07-27 NOTE — Addendum Note (Signed)
Addended by: Cheri Kearns A on: 07/27/2021 08:19 AM ? ? Modules accepted: Level of Service ? ?

## 2021-08-02 DIAGNOSIS — E1165 Type 2 diabetes mellitus with hyperglycemia: Secondary | ICD-10-CM | POA: Diagnosis not present

## 2021-08-12 ENCOUNTER — Ambulatory Visit (INDEPENDENT_AMBULATORY_CARE_PROVIDER_SITE_OTHER): Payer: Medicare HMO

## 2021-08-12 DIAGNOSIS — I495 Sick sinus syndrome: Secondary | ICD-10-CM

## 2021-08-12 LAB — CUP PACEART REMOTE DEVICE CHECK
Battery Remaining Longevity: 8 mo
Battery Remaining Longevity: 8 mo
Battery Remaining Percentage: 7 %
Battery Remaining Percentage: 7 %
Battery Voltage: 2.72 V
Battery Voltage: 2.72 V
Brady Statistic AP VP Percent: 1 %
Brady Statistic AP VP Percent: 1 %
Brady Statistic AP VS Percent: 65 %
Brady Statistic AP VS Percent: 65 %
Brady Statistic AS VP Percent: 1 %
Brady Statistic AS VP Percent: 1 %
Brady Statistic AS VS Percent: 35 %
Brady Statistic AS VS Percent: 35 %
Brady Statistic RA Percent Paced: 65 %
Brady Statistic RA Percent Paced: 65 %
Brady Statistic RV Percent Paced: 1 %
Brady Statistic RV Percent Paced: 1 %
Date Time Interrogation Session: 20230504051916
Date Time Interrogation Session: 20230504070014
Implantable Lead Implant Date: 20130920
Implantable Lead Implant Date: 20130920
Implantable Lead Implant Date: 20130920
Implantable Lead Implant Date: 20130920
Implantable Lead Location: 753859
Implantable Lead Location: 753860
Implantable Lead Location: 753859
Implantable Lead Location: 753860
Implantable Pulse Generator Implant Date: 20130920
Implantable Pulse Generator Implant Date: 20130920
Lead Channel Impedance Value: 300 Ohm
Lead Channel Impedance Value: 450 Ohm
Lead Channel Impedance Value: 300 Ohm
Lead Channel Impedance Value: 450 Ohm
Lead Channel Pacing Threshold Amplitude: 0.75 V
Lead Channel Pacing Threshold Amplitude: 0.75 V
Lead Channel Pacing Threshold Amplitude: 0.75 V
Lead Channel Pacing Threshold Amplitude: 0.75 V
Lead Channel Pacing Threshold Pulse Width: 0.4 ms
Lead Channel Pacing Threshold Pulse Width: 1 ms
Lead Channel Pacing Threshold Pulse Width: 0.4 ms
Lead Channel Pacing Threshold Pulse Width: 1 ms
Lead Channel Sensing Intrinsic Amplitude: 1.4 mV
Lead Channel Sensing Intrinsic Amplitude: 4.9 mV
Lead Channel Sensing Intrinsic Amplitude: 1.4 mV
Lead Channel Sensing Intrinsic Amplitude: 4.9 mV
Lead Channel Setting Pacing Amplitude: 2 V
Lead Channel Setting Pacing Amplitude: 2.5 V
Lead Channel Setting Pacing Amplitude: 2 V
Lead Channel Setting Pacing Amplitude: 2.5 V
Lead Channel Setting Pacing Pulse Width: 1 ms
Lead Channel Setting Pacing Pulse Width: 1 ms
Lead Channel Setting Sensing Sensitivity: 1 mV
Lead Channel Setting Sensing Sensitivity: 1 mV
Pulse Gen Model: 2210
Pulse Gen Model: 2210
Pulse Gen Serial Number: 7393982
Pulse Gen Serial Number: 7393982

## 2021-08-18 DIAGNOSIS — J449 Chronic obstructive pulmonary disease, unspecified: Secondary | ICD-10-CM | POA: Diagnosis not present

## 2021-08-18 DIAGNOSIS — C220 Liver cell carcinoma: Secondary | ICD-10-CM | POA: Diagnosis not present

## 2021-08-18 DIAGNOSIS — K7460 Unspecified cirrhosis of liver: Secondary | ICD-10-CM | POA: Diagnosis not present

## 2021-08-18 DIAGNOSIS — Z01818 Encounter for other preprocedural examination: Secondary | ICD-10-CM | POA: Diagnosis not present

## 2021-08-18 DIAGNOSIS — E042 Nontoxic multinodular goiter: Secondary | ICD-10-CM | POA: Diagnosis not present

## 2021-08-18 DIAGNOSIS — Z950 Presence of cardiac pacemaker: Secondary | ICD-10-CM | POA: Diagnosis not present

## 2021-08-18 DIAGNOSIS — J811 Chronic pulmonary edema: Secondary | ICD-10-CM | POA: Diagnosis not present

## 2021-08-18 DIAGNOSIS — I8500 Esophageal varices without bleeding: Secondary | ICD-10-CM | POA: Diagnosis not present

## 2021-08-18 DIAGNOSIS — K660 Peritoneal adhesions (postprocedural) (postinfection): Secondary | ICD-10-CM | POA: Diagnosis not present

## 2021-08-18 DIAGNOSIS — I2510 Atherosclerotic heart disease of native coronary artery without angina pectoris: Secondary | ICD-10-CM | POA: Diagnosis not present

## 2021-08-18 DIAGNOSIS — R9431 Abnormal electrocardiogram [ECG] [EKG]: Secondary | ICD-10-CM | POA: Diagnosis not present

## 2021-08-18 DIAGNOSIS — E039 Hypothyroidism, unspecified: Secondary | ICD-10-CM | POA: Diagnosis not present

## 2021-08-18 DIAGNOSIS — F32A Depression, unspecified: Secondary | ICD-10-CM | POA: Diagnosis not present

## 2021-08-18 DIAGNOSIS — I444 Left anterior fascicular block: Secondary | ICD-10-CM | POA: Diagnosis not present

## 2021-08-18 DIAGNOSIS — I10 Essential (primary) hypertension: Secondary | ICD-10-CM | POA: Diagnosis not present

## 2021-08-18 DIAGNOSIS — I480 Paroxysmal atrial fibrillation: Secondary | ICD-10-CM | POA: Diagnosis not present

## 2021-08-18 DIAGNOSIS — E119 Type 2 diabetes mellitus without complications: Secondary | ICD-10-CM | POA: Diagnosis not present

## 2021-08-23 DIAGNOSIS — C220 Liver cell carcinoma: Secondary | ICD-10-CM | POA: Diagnosis not present

## 2021-08-24 ENCOUNTER — Telehealth: Payer: Self-pay

## 2021-08-24 ENCOUNTER — Other Ambulatory Visit: Payer: Self-pay

## 2021-08-24 MED ORDER — PEG 3350-KCL-NA BICARB-NACL 420 G PO SOLR: 4000.0000 mL | ORAL | 0 refills | Status: AC

## 2021-08-24 NOTE — Telephone Encounter (Signed)
Pt called office, he changed pharmacies and requested TCS prep be sent to [facility] on [address]. Rx sent. ?

## 2021-08-25 NOTE — Progress Notes (Signed)
Remote pacemaker transmission.   

## 2021-08-26 NOTE — Patient Instructions (Signed)
Ralph [name]  08/26/2021     '@PREFPERIOPPHARMACY'$ @   Your procedure is scheduled on  09/02/2021.   Report to [facility] at  [place]   Call this number if you have problems the morning of surgery:  [phone]   Remember:      Follow the diet and prep instructions given to you by the office.    Your last dose of xarelto should be on 08/30/2021.     DO NOT take any medications for diabetes the morning of your procedure.     Take these medicines the morning of surgery with A SIP OF WATER                  Valium (If needed), diltiazem, lexapro, flecanide, levothyroxine, protonix, inderal.     Do not wear jewelry, make-up or nail polish.  Do not wear lotions, powders, or perfumes, or deodorant.  Do not shave 48 hours prior to surgery.  Men may shave face and neck.  Do not bring valuables to the hospital.  [facility] is not responsible for any belongings or valuables.  Contacts, dentures or bridgework may not be worn into surgery.  Leave your suitcase in the car.  After surgery it may be brought to your room.  For patients admitted to the hospital, discharge time will be determined by your treatment team.  Patients discharged the day of surgery will not be allowed to drive home and must  have someone with them for 24 hours.    Special instructions:   DO NOT smoke tobacco or vape for 24 hours before your procedure.  Please read over the following fact sheets that you were given. Anesthesia Post-op Instructions and Care and Recovery After Surgery      Upper Endoscopy, Adult, Care After This sheet gives you information about how to care for yourself after your procedure. Your health care provider may also give you more specific instructions. If you have problems or questions, contact your health care provider. What can I expect after the procedure? After the procedure, it is common to have: A sore throat. Mild stomach pain or  discomfort. Bloating. Nausea. Follow these instructions at home:  Follow instructions from your health care provider about what to eat or drink after your procedure. Return to your normal activities as told by your health care provider. Ask your health care provider what activities are safe for you. Take over-the-counter and prescription medicines only as told by your health care provider. If you were given a sedative during the procedure, it can affect you for several hours. Do not drive or operate machinery until your health care provider says that it is safe. Keep all follow-up visits as told by your health care provider. This is important. Contact a health care provider if you have: A sore throat that lasts longer than one day. Trouble swallowing. Get help right away if: You vomit blood or your vomit looks like coffee grounds. You have: A fever. Bloody, black, or tarry stools. A severe sore throat or you cannot swallow. Difficulty breathing. Severe pain in your chest or abdomen. Summary After the procedure, it is common to have a sore throat, mild stomach discomfort, bloating, and nausea. If you were given a sedative during the procedure, it can affect you for several hours. Do not drive or operate machinery until your health care provider says that it is safe. Follow instructions from your health care  provider about what to eat or drink after your procedure. Return to your normal activities as told by your health care provider. This information is not intended to replace advice given to you by your health care provider. Make sure you discuss any questions you have with your health care provider. Document Revised: 02/01/2019 Document Reviewed: 08/28/2017 Elsevier Patient Education  [place]. Colonoscopy, Adult, Care After The following information offers guidance on how to care for yourself after your procedure. Your health care provider may also give you more specific  instructions. If you have problems or questions, contact your health care provider. What can I expect after the procedure? After the procedure, it is common to have: A small amount of blood in your stool for 24 hours after the procedure. Some gas. Mild cramping or bloating of your abdomen. Follow these instructions at home: Eating and drinking  Drink enough fluid to keep your urine pale yellow. Follow instructions from your health care provider about eating or drinking restrictions. Resume your normal diet as told by your health care provider. Avoid heavy or fried foods that are hard to digest. Activity Rest as told by your health care provider. Avoid sitting for a long time without moving. Get up to take short walks every 1-2 hours. This is important to improve blood flow and breathing. Ask for help if you feel weak or unsteady. Return to your normal activities as told by your health care provider. Ask your health care provider what activities are safe for you. Managing cramping and bloating  Try walking around when you have cramps or feel bloated. If directed, apply heat to your abdomen as told by your health care provider. Use the heat source that your health care provider recommends, such as a moist heat pack or a heating pad. Place a towel between your skin and the heat source. Leave the heat on for 20-30 minutes. Remove the heat if your skin turns bright red. This is especially important if you are unable to feel pain, heat, or cold. You have a greater risk of getting burned. General instructions If you were given a sedative during the procedure, it can affect you for several hours. Do not drive or operate machinery until your health care provider says that it is safe. For the first 24 hours after the procedure: Do not sign important documents. Do not drink alcohol. Do your regular daily activities at a slower pace than normal. Eat soft foods that are easy to digest. Take  over-the-counter and prescription medicines only as told by your health care provider. Keep all follow-up visits. This is important. Contact a health care provider if: You have blood in your stool 2-3 days after the procedure. Get help right away if: You have more than a small spotting of blood in your stool. You have large blood clots in your stool. You have swelling of your abdomen. You have nausea or vomiting. You have a fever. You have increasing pain in your abdomen that is not relieved with medicine. These symptoms may be an emergency. Get help right away. Call 911. Do not wait to see if the symptoms will go away. Do not drive yourself to the hospital. Summary After the procedure, it is common to have a small amount of blood in your stool. You may also have mild cramping and bloating of your abdomen. If you were given a sedative during the procedure, it can affect you for several hours. Do not drive or operate machinery until  your health care provider says that it is safe. Get help right away if you have a lot of blood in your stool, nausea or vomiting, a fever, or increased pain in your abdomen. This information is not intended to replace advice given to you by your health care provider. Make sure you discuss any questions you have with your health care provider. Document Revised: 11/18/2020 Document Reviewed: 11/18/2020 Elsevier Patient Education  [place] After This sheet gives you information about how to care for yourself after your procedure. Your health care provider may also give you more specific instructions. If you have problems or questions, contact your health care provider. What can I expect after the procedure? After the procedure, it is common to have: Tiredness. Forgetfulness about what happened after the procedure. Impaired judgment for important decisions. Nausea or vomiting. Some difficulty with balance. Follow these  instructions at home: For the time period you were told by your health care provider:     Rest as needed. Do not participate in activities where you could fall or become injured. Do not drive or use machinery. Do not drink alcohol. Do not take sleeping pills or medicines that cause drowsiness. Do not make important decisions or sign legal documents. Do not take care of children on your own. Eating and drinking Follow the diet that is recommended by your health care provider. Drink enough fluid to keep your urine pale yellow. If you vomit: Drink water, juice, or soup when you can drink without vomiting. Make sure you have little or no nausea before eating solid foods. General instructions Have a responsible adult stay with you for the time you are told. It is important to have someone help care for you until you are awake and alert. Take over-the-counter and prescription medicines only as told by your health care provider. If you have sleep apnea, surgery and certain medicines can increase your risk for breathing problems. Follow instructions from your health care provider about wearing your sleep device: Anytime you are sleeping, including during daytime naps. While taking prescription pain medicines, sleeping medicines, or medicines that make you drowsy. Avoid smoking. Keep all follow-up visits as told by your health care provider. This is important. Contact a health care provider if: You keep feeling nauseous or you keep vomiting. You feel light-headed. You are still sleepy or having trouble with balance after 24 hours. You develop a rash. You have a fever. You have redness or swelling around the IV site. Get help right away if: You have trouble breathing. You have new-onset confusion at home. Summary For several hours after your procedure, you may feel tired. You may also be forgetful and have poor judgment. Have a responsible adult stay with you for the time you are told. It  is important to have someone help care for you until you are awake and alert. Rest as told. Do not drive or operate machinery. Do not drink alcohol or take sleeping pills. Get help right away if you have trouble breathing, or if you suddenly become confused. This information is not intended to replace advice given to you by your health care provider. Make sure you discuss any questions you have with your health care provider. Document Revised: 03/02/2021 Document Reviewed: 02/28/2019 Elsevier Patient Education  [place].

## 2021-08-30 ENCOUNTER — Encounter (HOSPITAL_COMMUNITY)
Admission: RE | Admit: 2021-08-30 | Discharge: 2021-08-30 | Disposition: A | Discharge status: Home / Self Care | Payer: Medicare HMO | Source: Ambulatory Visit | Attending: Internal Medicine | Admitting: Internal Medicine

## 2021-09-02 ENCOUNTER — Encounter (HOSPITAL_COMMUNITY)
Admission: RE | Disposition: A | Discharge status: Home / Self Care | Payer: Self-pay | Source: Home / Self Care | Attending: Internal Medicine

## 2021-09-02 ENCOUNTER — Ambulatory Visit (HOSPITAL_BASED_OUTPATIENT_CLINIC_OR_DEPARTMENT_OTHER): Payer: Medicare HMO | Admitting: Anesthesiology

## 2021-09-02 ENCOUNTER — Ambulatory Visit (HOSPITAL_COMMUNITY)
Admission: RE | Admit: 2021-09-02 | Discharge: 2021-09-02 | Disposition: A | Discharge status: Home / Self Care | Payer: Medicare HMO | Attending: Internal Medicine | Admitting: Internal Medicine

## 2021-09-02 ENCOUNTER — Encounter (HOSPITAL_COMMUNITY): Payer: Self-pay | Admitting: Internal Medicine

## 2021-09-02 ENCOUNTER — Ambulatory Visit (HOSPITAL_COMMUNITY): Payer: Medicare HMO | Admitting: Anesthesiology

## 2021-09-02 DIAGNOSIS — K921 Melena: Secondary | ICD-10-CM | POA: Diagnosis present

## 2021-09-02 DIAGNOSIS — I110 Hypertensive heart disease with heart failure: Secondary | ICD-10-CM | POA: Diagnosis not present

## 2021-09-02 DIAGNOSIS — I4892 Unspecified atrial flutter: Secondary | ICD-10-CM

## 2021-09-02 DIAGNOSIS — Z7984 Long term (current) use of oral hypoglycemic drugs: Secondary | ICD-10-CM | POA: Diagnosis not present

## 2021-09-02 DIAGNOSIS — R1011 Right upper quadrant pain: Secondary | ICD-10-CM

## 2021-09-02 DIAGNOSIS — Z85828 Personal history of other malignant neoplasm of skin: Secondary | ICD-10-CM | POA: Diagnosis not present

## 2021-09-02 DIAGNOSIS — I5031 Acute diastolic (congestive) heart failure: Secondary | ICD-10-CM

## 2021-09-02 DIAGNOSIS — E669 Obesity, unspecified: Secondary | ICD-10-CM

## 2021-09-02 DIAGNOSIS — Z794 Long term (current) use of insulin: Secondary | ICD-10-CM | POA: Diagnosis not present

## 2021-09-02 DIAGNOSIS — E1142 Type 2 diabetes mellitus with diabetic polyneuropathy: Secondary | ICD-10-CM

## 2021-09-02 DIAGNOSIS — I455 Other specified heart block: Secondary | ICD-10-CM

## 2021-09-02 DIAGNOSIS — E038 Other specified hypothyroidism: Secondary | ICD-10-CM

## 2021-09-02 DIAGNOSIS — I471 Supraventricular tachycardia: Secondary | ICD-10-CM

## 2021-09-02 DIAGNOSIS — K7460 Unspecified cirrhosis of liver: Secondary | ICD-10-CM | POA: Diagnosis not present

## 2021-09-02 DIAGNOSIS — Z8673 Personal history of transient ischemic attack (TIA), and cerebral infarction without residual deficits: Secondary | ICD-10-CM | POA: Insufficient documentation

## 2021-09-02 DIAGNOSIS — K3189 Other diseases of stomach and duodenum: Secondary | ICD-10-CM

## 2021-09-02 DIAGNOSIS — E039 Hypothyroidism, unspecified: Secondary | ICD-10-CM | POA: Insufficient documentation

## 2021-09-02 DIAGNOSIS — I8510 Secondary esophageal varices without bleeding: Secondary | ICD-10-CM | POA: Insufficient documentation

## 2021-09-02 DIAGNOSIS — I10 Essential (primary) hypertension: Secondary | ICD-10-CM

## 2021-09-02 DIAGNOSIS — Z950 Presence of cardiac pacemaker: Secondary | ICD-10-CM | POA: Insufficient documentation

## 2021-09-02 DIAGNOSIS — R079 Chest pain, unspecified: Secondary | ICD-10-CM

## 2021-09-02 DIAGNOSIS — R1013 Epigastric pain: Secondary | ICD-10-CM

## 2021-09-02 DIAGNOSIS — Z86010 Personal history of colonic polyps: Secondary | ICD-10-CM | POA: Diagnosis not present

## 2021-09-02 DIAGNOSIS — E119 Type 2 diabetes mellitus without complications: Secondary | ICD-10-CM | POA: Insufficient documentation

## 2021-09-02 DIAGNOSIS — Z1211 Encounter for screening for malignant neoplasm of colon: Secondary | ICD-10-CM | POA: Diagnosis not present

## 2021-09-02 DIAGNOSIS — I4891 Unspecified atrial fibrillation: Secondary | ICD-10-CM

## 2021-09-02 DIAGNOSIS — E785 Hyperlipidemia, unspecified: Secondary | ICD-10-CM

## 2021-09-02 DIAGNOSIS — F17200 Nicotine dependence, unspecified, uncomplicated: Secondary | ICD-10-CM

## 2021-09-02 DIAGNOSIS — Z833 Family history of diabetes mellitus: Secondary | ICD-10-CM | POA: Insufficient documentation

## 2021-09-02 DIAGNOSIS — Z8505 Personal history of malignant neoplasm of liver: Secondary | ICD-10-CM | POA: Diagnosis not present

## 2021-09-02 DIAGNOSIS — F17210 Nicotine dependence, cigarettes, uncomplicated: Secondary | ICD-10-CM | POA: Insufficient documentation

## 2021-09-02 DIAGNOSIS — K219 Gastro-esophageal reflux disease without esophagitis: Secondary | ICD-10-CM | POA: Insufficient documentation

## 2021-09-02 DIAGNOSIS — K766 Portal hypertension: Secondary | ICD-10-CM

## 2021-09-02 DIAGNOSIS — Z8379 Family history of other diseases of the digestive system: Secondary | ICD-10-CM | POA: Diagnosis not present

## 2021-09-02 DIAGNOSIS — D120 Benign neoplasm of cecum: Secondary | ICD-10-CM

## 2021-09-02 DIAGNOSIS — K635 Polyp of colon: Secondary | ICD-10-CM | POA: Diagnosis not present

## 2021-09-02 DIAGNOSIS — D123 Benign neoplasm of transverse colon: Secondary | ICD-10-CM | POA: Diagnosis not present

## 2021-09-02 DIAGNOSIS — J439 Emphysema, unspecified: Secondary | ICD-10-CM | POA: Diagnosis not present

## 2021-09-02 DIAGNOSIS — F32A Depression, unspecified: Secondary | ICD-10-CM | POA: Insufficient documentation

## 2021-09-02 DIAGNOSIS — R490 Dysphonia: Secondary | ICD-10-CM

## 2021-09-02 DIAGNOSIS — I509 Heart failure, unspecified: Secondary | ICD-10-CM | POA: Insufficient documentation

## 2021-09-02 DIAGNOSIS — K760 Fatty (change of) liver, not elsewhere classified: Secondary | ICD-10-CM | POA: Insufficient documentation

## 2021-09-02 DIAGNOSIS — J449 Chronic obstructive pulmonary disease, unspecified: Secondary | ICD-10-CM

## 2021-09-02 DIAGNOSIS — K641 Second degree hemorrhoids: Secondary | ICD-10-CM | POA: Insufficient documentation

## 2021-09-02 DIAGNOSIS — D649 Anemia, unspecified: Secondary | ICD-10-CM

## 2021-09-02 DIAGNOSIS — Z823 Family history of stroke: Secondary | ICD-10-CM | POA: Insufficient documentation

## 2021-09-02 DIAGNOSIS — Z79899 Other long term (current) drug therapy: Secondary | ICD-10-CM | POA: Insufficient documentation

## 2021-09-02 DIAGNOSIS — G4734 Idiopathic sleep related nonobstructive alveolar hypoventilation: Secondary | ICD-10-CM

## 2021-09-02 DIAGNOSIS — G4730 Sleep apnea, unspecified: Secondary | ICD-10-CM | POA: Diagnosis not present

## 2021-09-02 DIAGNOSIS — L409 Psoriasis, unspecified: Secondary | ICD-10-CM

## 2021-09-02 DIAGNOSIS — R1314 Dysphagia, pharyngoesophageal phase: Secondary | ICD-10-CM

## 2021-09-02 DIAGNOSIS — M1990 Unspecified osteoarthritis, unspecified site: Secondary | ICD-10-CM | POA: Insufficient documentation

## 2021-09-02 DIAGNOSIS — R55 Syncope and collapse: Secondary | ICD-10-CM

## 2021-09-02 DIAGNOSIS — Z8701 Personal history of pneumonia (recurrent): Secondary | ICD-10-CM | POA: Insufficient documentation

## 2021-09-02 DIAGNOSIS — R0902 Hypoxemia: Secondary | ICD-10-CM

## 2021-09-02 HISTORY — PX: ESOPHAGOGASTRODUODENOSCOPY (EGD) WITH PROPOFOL: SHX5813

## 2021-09-02 HISTORY — PX: COLONOSCOPY WITH PROPOFOL: SHX5780

## 2021-09-02 LAB — GLUCOSE, CAPILLARY: Glucose-Capillary: 112 mg/dL — ABNORMAL HIGH (ref 70–99)

## 2021-09-02 SURGERY — COLONOSCOPY WITH PROPOFOL
Anesthesia: General

## 2021-09-02 MED ORDER — LACTATED RINGERS IV SOLN: INTRAVENOUS | Status: DC

## 2021-09-02 MED ORDER — PROPOFOL 500 MG/50ML IV EMUL
INTRAVENOUS | Status: DC | PRN
  Administered 2021-09-02: 180 ug/kg/min via INTRAVENOUS

## 2021-09-02 MED ORDER — PROPOFOL 10 MG/ML IV BOLUS
INTRAVENOUS | Status: DC | PRN
  Administered 2021-09-02: 50 mg via INTRAVENOUS

## 2021-09-02 MED ORDER — ETOMIDATE 2 MG/ML IV SOLN
INTRAVENOUS | Status: DC | PRN
  Administered 2021-09-02: 6 mg via INTRAVENOUS

## 2021-09-02 MED ORDER — ETOMIDATE 2 MG/ML IV SOLN
INTRAVENOUS | Status: AC
  Filled 2021-09-02: qty 10

## 2021-09-02 MED ORDER — STERILE WATER FOR IRRIGATION IR SOLN
Status: DC | PRN
  Administered 2021-09-02: .6 mL

## 2021-09-02 MED ORDER — PHENYLEPHRINE HCL (PRESSORS) 10 MG/ML IV SOLN
INTRAVENOUS | Status: AC
  Filled 2021-09-02: qty 1

## 2021-09-02 MED ORDER — PROPOFOL 500 MG/50ML IV EMUL
INTRAVENOUS | Status: AC
  Filled 2021-09-02: qty 50

## 2021-09-02 MED ORDER — SODIUM CHLORIDE (PF) 0.9 % IJ SOLN
INTRAMUSCULAR | Status: AC
  Filled 2021-09-02: qty 10

## 2021-09-02 NOTE — Transfer of Care (Signed)
Immediate Anesthesia Transfer of Care Note  Patient: Ralph [name]  Procedure(s) Performed: COLONOSCOPY WITH PROPOFOL ESOPHAGOGASTRODUODENOSCOPY (EGD) WITH PROPOFOL  Patient Location: PACU  Anesthesia Type:General  Level of Consciousness: awake, alert  and oriented  Airway & Oxygen Therapy: Patient Spontanous Breathing  Post-op Assessment: Report given to RN, Post -op Vital signs reviewed and stable, Patient moving all extremities X 4 and Patient able to stick tongue midline  Post vital signs: Reviewed  Last Vitals:  Vitals Value Taken Time  BP 111/82   Temp 97.8   Pulse 62   Resp 17   SpO2 100     Last Pain:  Vitals:   09/02/21 0759  PainSc: 0-No pain         Complications: No notable events documented.

## 2021-09-02 NOTE — Anesthesia Postprocedure Evaluation (Signed)
Anesthesia Post Note  Patient: MUAAZ BRAU  Procedure(s) Performed: COLONOSCOPY WITH PROPOFOL ESOPHAGOGASTRODUODENOSCOPY (EGD) WITH PROPOFOL  Patient location during evaluation: Phase II Anesthesia Type: General Level of consciousness: awake and alert and oriented Pain management: pain level controlled Vital Signs Assessment: post-procedure vital signs reviewed and stable Respiratory status: spontaneous breathing, nonlabored ventilation and respiratory function stable Cardiovascular status: blood pressure returned to baseline and stable Postop Assessment: no apparent nausea or vomiting Anesthetic complications: no   No notable events documented.   Last Vitals:  Vitals:   09/02/21 0759 09/02/21 1020  BP: 113/68 111/82  Pulse: 90 67  Resp: 12 18  Temp: 36.5 C (!) 36.4 C  SpO2: 93% 96%    Last Pain:  Vitals:   09/02/21 1020  TempSrc: Axillary  PainSc: 0-No pain                 Garold Sheeler C Levelle Edelen

## 2021-09-02 NOTE — Anesthesia Preprocedure Evaluation (Addendum)
Anesthesia Evaluation  Patient identified by MRN, date of birth, ID band Patient awake    Reviewed: Allergy & Precautions, NPO status , Patient's Chart, lab work & pertinent test results, reviewed documented beta blocker date and time   History of Anesthesia Complications (+) DIFFICULT AIRWAY and history of anesthetic complications  Airway Mallampati: II  TM Distance: >3 FB Neck ROM: Full    Dental  (+) Dental Advisory Given, Missing   Pulmonary sleep apnea , pneumonia, COPD, Current Smoker and Patient abstained from smoking.,    Pulmonary exam normal breath sounds clear to auscultation       Cardiovascular hypertension, Pt. on medications and Pt. on home beta blockers + angina +CHF  Normal cardiovascular exam+ dysrhythmias Atrial Fibrillation + pacemaker  Rhythm:Regular Rate:Normal     Neuro/Psych  Headaches, PSYCHIATRIC DISORDERS Depression  Neuromuscular disease CVA    GI/Hepatic hiatal hernia, GERD  Medicated,(+) Cirrhosis  (fatty liver, liver cancer)  Esophageal Varices    ,   Endo/Other  diabetes, Well Controlled, Type 2, Insulin Dependent, Oral Hypoglycemic AgentsHypothyroidism   Renal/GU negative Renal ROS  negative genitourinary   Musculoskeletal  (+) Arthritis , Osteoarthritis,    Abdominal   Peds negative pediatric ROS (+)  Hematology  (+) Blood dyscrasia, anemia ,   Anesthesia Other Findings   Reproductive/Obstetrics negative OB ROS                             Anesthesia Physical Anesthesia Plan  ASA: 3  Anesthesia Plan: General   Post-op Pain Management: Minimal or no pain anticipated   Induction: Intravenous  PONV Risk Score and Plan: Propofol infusion  Airway Management Planned: Nasal Cannula and Natural Airway  Additional Equipment:   Intra-op Plan:   Post-operative Plan:   Informed Consent: I have reviewed the patients History and Physical, chart, labs  and discussed the procedure including the risks, benefits and alternatives for the proposed anesthesia with the patient or authorized representative who has indicated his/her understanding and acceptance.     Dental advisory given  Plan Discussed with: CRNA and Surgeon  Anesthesia Plan Comments:         Anesthesia Quick Evaluation

## 2021-09-02 NOTE — Op Note (Signed)
[facility] Patient Name: Ralph [name] Procedure Date: 09/02/2021 9:55 AM MRN: [id number] Date of Birth: [birth year] Attending MD: Norvel Richards , MD CSN: [id number] Age: 71 Admit Type: Outpatient Procedure:                Colonoscopy Indications:              High risk colon cancer surveillance: Personal                            history of colonic polyps Providers:                Norvel Richards, MD, Janeece Riggers, RN, Randa Spike, Technician Referring MD:              Medicines:                Propofol per Anesthesia Complications:            No immediate complications. Estimated Blood Loss:     Estimated blood loss was minimal. Procedure:                Pre-Anesthesia Assessment:                           - Prior to the procedure, a History and Physical                            was performed, and patient medications and                            allergies were reviewed. The patient's tolerance of                            previous anesthesia was also reviewed. The risks                            and benefits of the procedure and the sedation                            options and risks were discussed with the patient.                            All questions were answered, and informed consent                            was obtained. Prior Anticoagulants: The patient                            last took Xarelto (rivaroxaban) 2 days prior to the                            procedure. ASA Grade Assessment: III - A patient  with severe systemic disease. After reviewing the                            risks and benefits, the patient was deemed in                            satisfactory condition to undergo the procedure.                           After obtaining informed consent, the colonoscope                            was passed under direct vision. Throughout the                            procedure, the  patient's blood pressure, pulse, and                            oxygen saturations were monitored continuously. The                            [id number] scope was introduced through the                            anus and advanced to the the cecum, identified by                            appendiceal orifice and ileocecal valve. The                            colonoscopy was performed without difficulty. The                            patient tolerated the procedure well. The quality                            of the bowel preparation was adequate. Scope In: 9:54:26 AM Scope Out: 10:09:47 AM Scope Withdrawal Time: 0 hours 10 minutes 43 seconds  Total Procedure Duration: 0 hours 15 minutes 21 seconds  Findings:      The perianal and digital rectal examinations were normal.      Three semi-pedunculated polyps were found in the splenic flexure and       cecum. The polyps were 6 to 7 mm in size. These polyps were removed with       a cold snare. Resection and retrieval were complete. Estimated blood       loss was minimal.      Non-bleeding internal hemorrhoids were found during retroflexion. The       hemorrhoids were moderate, medium-sized and Grade II (internal       hemorrhoids that prolapse but reduce spontaneously).      The exam was otherwise without abnormality on direct and retroflexion       views. Impression:               - Three 6 to 7 mm polyps at the splenic  flexure and                            in the cecum, removed with a cold snare. Resected                            and retrieved.                           - Non-bleeding internal hemorrhoids.                           - The examination was otherwise normal on direct                            and retroflexion views. Moderate Sedation:      Moderate (conscious) sedation was personally administered by an       anesthesia professional. The following parameters were monitored: oxygen       saturation, heart rate,  blood pressure, respiratory rate, EKG, adequacy       of pulmonary ventilation, and response to care. Recommendation:           - Patient has a contact number available for                            emergencies. The signs and symptoms of potential                            delayed complications were discussed with the                            patient. Return to normal activities tomorrow.                            Written discharge instructions were provided to the                            patient.                           - Advance diet as tolerated.                           - Continue present medications.                           - Repeat colonoscopy date to be determined after                            pending pathology results are reviewed for                            surveillance.                           - Return to GI office in 6 months. See EGD report. Procedure Code(s):        ---  Professional ---                           [phone], Colonoscopy, flexible; with removal of                            tumor(s), polyp(s), or other lesion(s) by snare                            technique Diagnosis Code(s):        --- Professional ---                           Z86.010, Personal history of colonic polyps                           K63.5, Polyp of colon                           K64.1, Second degree hemorrhoids CPT copyright 2019 [employer]. All rights reserved. The codes documented in this report are preliminary and upon coder review may  be revised to meet current compliance requirements. [name]. [name], MD Norvel Richards, MD 09/02/2021 10:14:42 AM This report has been signed electronically. Number of Addenda: 0

## 2021-09-02 NOTE — H&P (Signed)
$'@LOGO'C$ @   Primary Care Physician:  Sharilyn Sites, MD Primary Gastroenterologist:  Dr. Gala Romney  Pre-Procedure History & Physical: HPI:  Ralph [name] is a 71 y.o. male here for With cirrhosis complicated by hepatocellular carcinoma recently status post laparoscopic microwave ablation of tumor at [place].  Intermittent dark stools.  Hemoglobin remains well  within the normal range.  Known grade 1 esophageal varices undergoing primary prophylaxis with Inderal.  History of colonic adenoma.  Patient is here for a reassessment of his upper GI tract and colonoscopy per plan.  Past Medical History:  Diagnosis Date   Anginal pain ([place])    Arthritis    "back; fingers" (01/06/2012)   Atrial flutter (HCC)    s/p EPS +RF ablation of typical atrial flutter April 2015   Cancer [facility])    skin cancer   CHB (complete heart block) (HCC)    CHF (congestive heart failure) ([place]) 01/06/2012   Chronic lower back pain    Cirrhosis ([place])    NASH-Hep A and B immune   Coughing up blood    "comes from my throat" (01/06/2012)   DDD (degenerative disc disease), lumbar    Depressed    Difficult intubation    Eschmann stylet used in 2002 and 2007; "trouble waking up afterwards" (01/06/2012)   Emphysema    Fatty liver disease, nonalcoholic    GERD (gastroesophageal reflux disease)    H/O hiatal hernia    Headache    History of esophageal varices    Hypertension    Hypothyroidism    Liver cancer ([place])    Orthostatic dizziness    Pacemaker    Pneumonia 11/2014   Presence of permanent cardiac pacemaker 9/292013   [place]   Sinus pause 01/06/2012   5.2 seconds   Sleep apnea    "don't wear mask" (01/06/2012)   Stroke [facility])    pt states that he might have had a stroke not sure   Type II diabetes mellitus ([place])    Varicose vein    of esophagus    Past Surgical History:  Procedure Laterality Date   ATRIAL FLUTTER ABLATION N/A 07/10/2013   Procedure: ATRIAL FLUTTER ABLATION;  Surgeon: Evans Lance, MD;  Location: [facility] CATH LAB;  Service: Cardiovascular;  Laterality: N/A;   BACK SURGERY     CHOLECYSTECTOMY  1993   COLONOSCOPY  11/08/2004   ZJI:RCVELF rectum, colon, TI.   COLONOSCOPY N/A 05/28/2014   Dr. Gala Romney: Redundant colon. single colonic polyp removed as described above. Tubular adenoma   ESOPHAGEAL DILATION N/A 05/28/2014   Procedure: ESOPHAGEAL DILATION;  Surgeon: Daneil Dolin, MD;  Location: [facility] ENDO [facility];  Service: Endoscopy;  Laterality: N/A;   ESOPHAGOGASTRODUODENOSCOPY  11/08/2004   YBO:FBPZWC esophageal erosions consistent with erosive reflux esophagitis/Areas of hemorrhage and nodularity of the fundal mucosa of uncertain significance, biopsied.  Small hiatal hernia, otherwise normal stomach   ESOPHAGOGASTRODUODENOSCOPY  2010   Dr. Gala Romney: 3 columns Grade 1 varices, erosive esophagitis, HH, portal gastropathy, normal D1, D2   ESOPHAGOGASTRODUODENOSCOPY N/A 05/28/2014   Dr. Gala Romney: MIld erosive reflux esophagitis. Grade 1 esophageal varices. Patent esophagus. No dilation performed. Hiatal hernia.    ESOPHAGOGASTRODUODENOSCOPY (EGD) WITH ESOPHAGEAL DILATION N/A 02/14/2013   HEN:IDPOE 1 esophageal varices. Abnormal distal esophagus/status post biopsy after Maloney dilation. Portal gastropathy. Antral erosions-status post biopsy. path negative for H.pylori, benign path.   IR 3D INDEPENDENT WKST  05/24/2021   IR ANGIOGRAM SELECTIVE EACH ADDITIONAL VESSEL  04/30/2021   IR ANGIOGRAM SELECTIVE  EACH ADDITIONAL VESSEL  04/30/2021   IR ANGIOGRAM SELECTIVE EACH ADDITIONAL VESSEL  04/30/2021   IR ANGIOGRAM SELECTIVE EACH ADDITIONAL VESSEL  04/30/2021   IR ANGIOGRAM SELECTIVE EACH ADDITIONAL VESSEL  04/30/2021   IR ANGIOGRAM SELECTIVE EACH ADDITIONAL VESSEL  05/24/2021   IR ANGIOGRAM VISCERAL SELECTIVE  04/30/2021   IR ANGIOGRAM VISCERAL SELECTIVE  04/30/2021   IR EMBO ARTERIAL NOT HEMORR HEMANG INC GUIDE ROADMAPPING  04/30/2021   IR EMBO TUMOR ORGAN ISCHEMIA INFARCT INC GUIDE ROADMAPPING   05/24/2021   IR RADIOLOGIST EVAL & MGMT  03/03/2021   IR US GUIDE VASC ACCESS RIGHT  04/30/2021   IR US GUIDE VASC ACCESS RIGHT  05/24/2021   LUMBAR [place] SURGERY  1994; ~ 1995; ~ [place]   nuclear stress test  10/19/2004   No ischemia   PERMANENT PACEMAKER INSERTION  01/08/2012   CHB   PERMANENT PACEMAKER INSERTION N/A 01/09/2012   Procedure: PERMANENT PACEMAKER INSERTION;  Surgeon: Sanda Klein, MD;  Location: [facility] CATH LAB;  Service: Cardiovascular;  Laterality: N/A;   POSTERIOR FUSION LUMBAR SPINE  1999   L4-5   SPINAL CORD STIMULATOR IMPLANT  2006   SPINAL CORD STIMULATOR REMOVAL N/A 01/27/2015   Procedure: LUMBAR SPINAL CORD STIMULATOR REMOVAL;  Surgeon: Kristeen Miss, MD;  Location: [facility] NEURO ORS;  Service: Neurosurgery;  Laterality: N/A;  LUMBAR SPINAL CORD STIMULATOR REMOVAL   TONSILLECTOMY AND ADENOIDECTOMY  1992   US ECHOCARDIOGRAPHY  12/28/2011   mild LVH,mild mitral annulara ca+,mild MR   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 Authorizing Provider  acetaminophen (TYLENOL) 500 MG tablet Take 500 mg by mouth every 6 (six) hours as needed for moderate pain.   Yes [provider]  ciclopirox (PENLAC) 8 % solution APPLY TOPICALLY AT BEDTIME. APPLY OVER NAIL AND SURROUNDING SKIN. APPLY DAILY OVER PREVIOUS COAT. AFTER SEVEN (7) DAYS, MAY REMOVE WITH ALCOHOL AND CONTINUE CYCLE. 07/14/21  Yes Lorenda Peck, DPM  dapagliflozin propanediol (FARXIGA) 10 MG TABS tablet Take 1 tablet (10 mg total) by mouth daily. 05/03/21  Yes Shamleffer, Melanie Crazier, MD  diazepam (VALIUM) 10 MG tablet Take 1 tablet by mouth every 8 (eight) hours as needed (muscle spasms). 05/20/19  Yes [provider]  diltiazem (CARDIZEM CD) 300 MG 24 hr capsule TAKE 1 CAPSULE BY MOUTH EVERY DAY 07/12/21  Yes Branch, Alphonse Guild, MD  escitalopram (LEXAPRO) 10 MG tablet Take 10 mg by mouth daily. 04/19/19  Yes  [provider]  flecainide (TAMBOCOR) 100 MG tablet TAKE 1 TABLET BY MOUTH TWICE A DAY 05/21/21  Yes Evans Lance, MD  furosemide (LASIX) 40 MG tablet Take 40 mg by mouth daily as needed for fluid or edema.   Yes [provider]  Insulin Aspart FlexPen (NOVOLOG) 100 UNIT/ML Max daily 50 units Patient taking differently: Inject 10-14 Units into the skin 3 (three) times daily before meals. Max daily 50 units 05/03/21  Yes Shamleffer, Melanie Crazier, MD  Insulin Degludec FlexTouch 100 UNIT/ML SOPN Inject 32 Units into the skin daily. 05/03/21  Yes Shamleffer, Melanie Crazier, MD  levothyroxine (SYNTHROID, LEVOTHROID) 112 MCG tablet TAKE 1 TABLET BY MOUTH EVERY MORNING BEFORE BREAKFAST 04/24/17  Yes Nida, Marella Chimes, MD  metFORMIN (GLUCOPHAGE) 1000 MG tablet Take 1 tablet (1,000 mg total) by mouth 2 (two) times daily with a meal. 05/03/21  Yes Shamleffer, Melanie Crazier, MD  Omega-3 Fatty Acids (FISH OIL) 1000 MG CAPS Take 1,000 mg by mouth in the morning and at bedtime.   Yes [provider]  oxymetazoline (AFRIN) 0.05 % nasal spray Place 1 spray into both nostrils 2 (two) times daily as needed for congestion. Sinex brand   Yes [provider]  pantoprazole (PROTONIX) 40 MG tablet Take 1 tablet (40 mg total) by mouth daily. 03/01/21  Yes Erenest Rasher, PA-C  Polyethyl Glycol-Propyl Glycol (SYSTANE) 0.4-0.3 % SOLN Place 1 drop into both eyes every 6 (six) hours as needed (dry eyes).   Yes [provider]  propranolol (INDERAL) 20 MG tablet TAKE 1 TABLET BY MOUTH TWICE A DAY 02/23/21  Yes Mahala Menghini, PA-C  rivaroxaban (XARELTO) 20 MG TABS tablet TAKE 1 TABLET BY MOUTH EVERY DAY WITH DINNER 05/09/18  Yes Evans Lance, MD  simvastatin (ZOCOR) 20 MG tablet TAKE 1 TABLET BY MOUTH EVERY DAY IN THE EVENING 05/21/21  Yes Arnoldo Lenis, MD  spironolactone (ALDACTONE) 50 MG tablet TAKE 1 TABLET BY MOUTH TWICE A DAY 02/23/21  Yes Mahala Menghini, PA-C   ACCU-CHEK GUIDE test strip TEST TWICE A DAY BEFORE MEALS 01/13/20   Shamleffer, Melanie Crazier, MD  Blood Glucose Monitoring Suppl (ACCU-CHEK AVIVA) device by Other route. Use as instructed to check blood sugar 2 times daily    [provider]  Continuous Blood Gluc Receiver (DEXCOM G6 RECEIVER) DEVI Use as instruct to check blood sugar daily 05/18/20   Shamleffer, Melanie Crazier, MD  Continuous Blood Gluc Sensor (DEXCOM G6 SENSOR) MISC 1 Device by Does not apply route as directed. 04/30/20   Shamleffer, Melanie Crazier, MD  Continuous Blood Gluc Transmit (DEXCOM G6 TRANSMITTER) MISC 1 Device by Does not apply route as directed. 04/30/20   Shamleffer, Melanie Crazier, MD  Insulin Pen Needle 31G X 5 MM MISC 1 Device by Does not apply route in the morning, at noon, in the evening, and at bedtime. 05/03/21   Shamleffer, Melanie Crazier, MD  polyethylene glycol-electrolytes (TRILYTE) 420 g solution Take 4,000 mLs by mouth as directed. 08/24/21   Daneil Dolin, MD    Allergies as of 07/15/2021 - Review Complete 07/13/2021  Allergen Reaction Noted   Nitroglycerin Hives, Swelling, and Rash     Family History  Problem Relation Age of Onset   Cancer Mother        Deceased, 78   Ovarian cancer Mother    Arrhythmia Father    Other Father        Deceased 15   Stroke Brother    Stroke Brother    Stroke Sister    Crohn's disease Daughter    Diabetes Maternal Grandmother    Colon cancer Neg Hx     Social History   Socioeconomic History   Marital status: Married    Spouse name: Not on file   Number of children: Not on file   Years of education: Not on file   Highest education level: Not on file  Occupational History   Not on file  Tobacco Use   Smoking status: Every Day    Packs/day: 0.50    Years: 45.00    Pack years: 22.50    Types: Cigarettes    Start date: 04/11/1968   Smokeless tobacco: Never   Tobacco comments:    Quit x 8 months this time  Vaping Use   Vaping Use:  Never used  Substance and Sexual Activity   Alcohol use: No  Alcohol/week: 0.0 standard drinks    Comment: "quit alcohol 2011" Previously drinking socially about twice per month   Drug use: No   Sexual activity: Never  Other Topics Concern   Not on file  Social History Narrative   Lives with wife in a one story home.  Has 3 children.     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     Education: some college.   Social Determinants of Health   Financial Resource Strain: Not on file  Food Insecurity: Not on file  Transportation Needs: Not on file  Physical Activity: Not on file  Stress: Not on file  Social Connections: Not on file  Intimate Partner Violence: Not on file    Review of Systems: See HPI, otherwise negative ROS  Physical Exam: BP 113/68 (BP Location: Left Arm)   Pulse 90   Temp 97.7 F (36.5 C)   Resp 12   SpO2 93%  General:   Alert,    Disheveled pleasant and cooperative in NAD Skin:  Intact without significant lesions or rashes. ENeck:  Supple; no masses or thyromegaly. No significant cervical adenopathy. Lungs:  Clear throughout to auscultation.   No wheezes, crackles, or rhonchi. No acute distress. Heart:  Regular rate and rhythm; no murmurs, clicks, rubs,  or gallops. Abdomen:  obese.  Laparoscopy port sites healing well.  Abdomen is soft and nontender . Pulses:  Normal pulses noted. Extremities:  Without clubbing or edema.  Impression/Plan:   71 year old gentleman with cirrhosis complicated by hepatocellular carcinoma known grade 1 esophageal varices.  Recent laparoscopic microwave ablation of hepatoma.  Periodic dark stools.  No overt GI bleeding hemoglobin remains well in the normal range.  History of colonic adenoma.  I have offered the patient a reassessment of his of his upper GI tract along with a colonoscopy today per plan. The risks, benefits, limitations, imponderables and alternatives regarding both EGD and colonoscopy have been reviewed with the  patient. Questions have been answered. All parties agreeable.         Notice: This dictation was prepared with Dragon dictation along with smaller phrase technology. Any transcriptional errors that result from this process are unintentional and may not be corrected upon review.

## 2021-09-02 NOTE — Op Note (Signed)
[facility] Patient Name: Ralph [name] Procedure Date: 09/02/2021 9:08 AM MRN: [id number] Date of Birth: [birth year] Attending MD: Norvel Richards , MD CSN: [id number] Age: 71 Admit Type: Outpatient Procedure:                Upper GI endoscopy Indications:              [place] Providers:                Norvel Richards, MD, Janeece Riggers, RN, Randa Spike, Technician Referring MD:              Medicines:                Propofol per Anesthesia Complications:            No immediate complications. Estimated Blood Loss:     Estimated blood loss: none. Procedure:                Pre-Anesthesia Assessment:                           - Prior to the procedure, a History and Physical                            was performed, and patient medications and                            allergies were reviewed. The patient's tolerance of                            previous anesthesia was also reviewed. The risks                            and benefits of the procedure and the sedation                            options and risks were discussed with the patient.                            All questions were answered, and informed consent                            was obtained. Prior Anticoagulants: The patient                            last took Xarelto (rivaroxaban) 2 days prior to the                            procedure. ASA Grade Assessment: III - A patient                            with severe systemic disease. After reviewing the  risks and benefits, the patient was deemed in                            satisfactory condition to undergo the procedure.                           After obtaining informed consent, the endoscope was                            passed under direct vision. Throughout the                            procedure, the patient's blood pressure, pulse, and                            oxygen saturations were  monitored continuously. The                            GIF-H190 ([id number]) scope was introduced through the                            mouth, and advanced to the second part of duodenum.                            The upper GI endoscopy was accomplished without                            difficulty. The patient tolerated the procedure                            well. Scope In: 9:44:46 AM Scope Out: 9:48:44 AM Total Procedure Duration: 0 hours 3 minutes 58 seconds  Findings:      3 columns of no more than grade 1 esophageal varices. No more than 5 cm       in length. Overlying mucosa otherwise appeared normal.      Moderate portal hypertensive gastropathy was found in the entire       examined stomach. No gastric varices or other abnormality.      The duodenal bulb and second portion of the duodenum were normal. Impression:               - Innocent appearing grade 1 esophageal varices.                            Portal hypertensive gastropathy.                           - Normal duodenal bulb and second portion of the                            duodenum.                           - No specimens collected. I am not convinced  patient has had significant stuttering GI bleed                            with hemoglobin remaining well within normal limits. Moderate Sedation:      Moderate (conscious) sedation was personally administered by an       anesthesia professional. The following parameters were monitored: oxygen       saturation, heart rate, blood pressure, respiratory rate, EKG, adequacy       of pulmonary ventilation, and response to care. Recommendation:           - Patient has a contact number available for                            emergencies. The signs and symptoms of potential                            delayed complications were discussed with the                            patient. Return to normal activities tomorrow.                             Written discharge instructions were provided to the                            patient.                           - Advance diet as tolerated. Continue Inderal. No                            future EGD unless new symptoms develop. See                            colonoscopy report. Procedure Code(s):        --- Professional ---                           [phone], Esophagogastroduodenoscopy, flexible,                            transoral; diagnostic, including collection of                            specimen(s) by brushing or washing, when performed                            (separate procedure) Diagnosis Code(s):        --- Professional ---                           K76.6, Portal hypertension                           K31.89, Other diseases of stomach and duodenum  K92.1, Melena (includes Hematochezia) CPT copyright 2019 American Medical Association. All rights reserved. The codes documented in this report are preliminary and upon coder review may  be revised to meet current compliance requirements. Ralph [name]. Ralph Berisha, MD Norvel Richards, MD 09/02/2021 10:11:47 AM This report has been signed electronically. Number of Addenda: 0

## 2021-09-02 NOTE — Discharge Instructions (Addendum)
Colonoscopy Discharge Instructions  Read the instructions outlined below and refer to this sheet in the next few weeks. These discharge instructions provide you with general information on caring for yourself after you leave the hospital. Your doctor may also give you specific instructions. While your treatment has been planned according to the most current medical practices available, unavoidable complications occasionally occur. If you have any problems or questions after discharge, call Dr. Gala Romney at [phone]. ACTIVITY You may resume your regular activity, but move at a slower pace for the next 24 hours.  Take frequent rest periods for the next 24 hours.  Walking will help get rid of the air and reduce the bloated feeling in your belly (abdomen).  No driving for 24 hours (because of the medicine (anesthesia) used during the test).   Do not sign any important legal documents or operate any machinery for 24 hours (because of the anesthesia used during the test).  NUTRITION Drink plenty of fluids.  You may resume your normal diet as instructed by your doctor.  Begin with a light meal and progress to your normal diet. Heavy or fried foods are harder to digest and may make you feel sick to your stomach (nauseated).  Avoid alcoholic beverages for 24 hours or as instructed.  MEDICATIONS You may resume your normal medications unless your doctor tells you otherwise.  WHAT YOU CAN EXPECT TODAY Some feelings of bloating in the abdomen.  Passage of more gas than usual.  Spotting of blood in your stool or on the toilet paper.  IF YOU HAD POLYPS REMOVED DURING THE COLONOSCOPY: No aspirin products for 7 days or as instructed.  No alcohol for 7 days or as instructed.  Eat a soft diet for the next 24 hours.  FINDING OUT THE RESULTS OF YOUR TEST Not all test results are available during your visit. If your test results are not back during the visit, make an appointment with your caregiver to find out the  results. Do not assume everything is normal if you have not heard from your caregiver or the medical facility. It is important for you to follow up on all of your test results.  SEEK IMMEDIATE MEDICAL ATTENTION IF: You have more than a spotting of blood in your stool.  Your belly is swollen (abdominal distention).  You are nauseated or vomiting.  You have a temperature over 101.  You have abdominal pain or discomfort that is severe or gets worse throughout the day.     right   EGD Discharge instructions Please read the instructions outlined below and refer to this sheet in the next few weeks. These discharge instructions provide you with general information on caring for yourself after you leave the hospital. Your doctor may also give you specific instructions. While your treatment has been planned according to the most current medical practices available, unavoidable complications occasionally occur. If you have any problems or questions after discharge, please call your doctor. ACTIVITY You may resume your regular activity but move at a slower pace for the next 24 hours.  Take frequent rest periods for the next 24 hours.  Walking will help expel (get rid of) the air and reduce the bloated feeling in your abdomen.  No driving for 24 hours (because of the anesthesia (medicine) used during the test).  You may shower.  Do not sign any important legal documents or operate any machinery for 24 hours (because of the anesthesia used during the test).  NUTRITION Drink plenty  of fluids.  You may resume your normal diet.  Begin with a light meal and progress to your normal diet.  Avoid alcoholic beverages for 24 hours or as instructed by your caregiver.  MEDICATIONS You may resume your normal medications unless your caregiver tells you otherwise.  WHAT YOU CAN EXPECT TODAY You may experience abdominal discomfort such as a feeling of fullness or "gas" pains.  FOLLOW-UP Your doctor will discuss  the results of your test with you.  SEEK IMMEDIATE MEDICAL ATTENTION IF ANY OF THE FOLLOWING OCCUR: Excessive nausea (feeling sick to your stomach) and/or vomiting.  Severe abdominal pain and distention (swelling).  Trouble swallowing.  Temperature over 101 F (37.8 C).  Rectal bleeding or vomiting of blood.      Esophageal varices were small.  They needed no treatment.    3 polyps removed from your colon  Further recommendations to follow pending review of pathology report   office visit with Ralph [name] in 6 months   at patient request, I called Ralph [name] at [phone] -  rolled to voicemail.  Left a message   With information.Marland Kitchen    Resume Xarelto today.

## 2021-09-03 ENCOUNTER — Encounter: Payer: Self-pay | Admitting: Internal Medicine

## 2021-09-03 LAB — SURGICAL PATHOLOGY

## 2021-09-09 ENCOUNTER — Encounter (HOSPITAL_COMMUNITY): Payer: Self-pay | Admitting: Internal Medicine

## 2021-09-13 ENCOUNTER — Ambulatory Visit (INDEPENDENT_AMBULATORY_CARE_PROVIDER_SITE_OTHER): Payer: Medicare HMO

## 2021-09-13 DIAGNOSIS — I495 Sick sinus syndrome: Secondary | ICD-10-CM | POA: Diagnosis not present

## 2021-09-13 LAB — CUP PACEART REMOTE DEVICE CHECK
Battery Remaining Longevity: 7 mo
Battery Remaining Percentage: 6 %
Battery Voltage: 2.71 V
Brady Statistic AP VP Percent: 1 %
Brady Statistic AP VS Percent: 61 %
Brady Statistic AS VP Percent: 1 %
Brady Statistic AS VS Percent: 39 %
Brady Statistic RA Percent Paced: 60 %
Brady Statistic RV Percent Paced: 1 %
Date Time Interrogation Session: 20230604032949
Implantable Lead Implant Date: 20130920
Implantable Lead Implant Date: 20130920
Implantable Lead Location: 753859
Implantable Lead Location: 753860
Implantable Pulse Generator Implant Date: 20130920
Lead Channel Impedance Value: 300 Ohm
Lead Channel Impedance Value: 430 Ohm
Lead Channel Pacing Threshold Amplitude: 0.75 V
Lead Channel Pacing Threshold Amplitude: 0.75 V
Lead Channel Pacing Threshold Pulse Width: 0.4 ms
Lead Channel Pacing Threshold Pulse Width: 1 ms
Lead Channel Sensing Intrinsic Amplitude: 1.4 mV
Lead Channel Sensing Intrinsic Amplitude: 4.9 mV
Lead Channel Setting Pacing Amplitude: 2 V
Lead Channel Setting Pacing Amplitude: 2.5 V
Lead Channel Setting Pacing Pulse Width: 1 ms
Lead Channel Setting Sensing Sensitivity: 1 mV
Pulse Gen Model: 2210
Pulse Gen Serial Number: 7393982

## 2021-09-16 ENCOUNTER — Telehealth: Payer: Self-pay | Admitting: Cardiology

## 2021-09-16 MED ORDER — SIMVASTATIN 20 MG PO TABS: ORAL_TABLET | ORAL | 1 refills | Status: AC

## 2021-09-16 NOTE — Telephone Encounter (Signed)
*  STAT* If patient is at the pharmacy, call can be transferred to refill team.   1. Which medications need to be refilled? (please list name of each medication and dose if known) simvastatin (ZOCOR) 20 MG tablet  2. Which pharmacy/location (including street and city if local pharmacy) is medication to be sent to? [id number] - [place], [address]  3. Do they need a 30 day or 90 day supply? 90  Patient is out of medication

## 2021-09-20 DIAGNOSIS — H5200 Hypermetropia, unspecified eye: Secondary | ICD-10-CM | POA: Diagnosis not present

## 2021-09-30 NOTE — Progress Notes (Signed)
Remote pacemaker transmission.   

## 2021-10-07 DIAGNOSIS — C220 Liver cell carcinoma: Secondary | ICD-10-CM | POA: Diagnosis not present

## 2021-10-07 DIAGNOSIS — E279 Disorder of adrenal gland, unspecified: Secondary | ICD-10-CM | POA: Diagnosis not present

## 2021-10-13 ENCOUNTER — Ambulatory Visit (INDEPENDENT_AMBULATORY_CARE_PROVIDER_SITE_OTHER): Payer: Medicare HMO

## 2021-10-13 DIAGNOSIS — I495 Sick sinus syndrome: Secondary | ICD-10-CM

## 2021-10-16 LAB — CUP PACEART REMOTE DEVICE CHECK
Battery Remaining Longevity: 5 mo
Battery Remaining Percentage: 5 %
Battery Voltage: 2.69 V
Brady Statistic AP VP Percent: 1 %
Brady Statistic AP VS Percent: 58 %
Brady Statistic AS VP Percent: 1 %
Brady Statistic AS VS Percent: 42 %
Brady Statistic RA Percent Paced: 58 %
Brady Statistic RV Percent Paced: 1 %
Date Time Interrogation Session: 20230705134646
Implantable Lead Implant Date: 20130920
Implantable Lead Implant Date: 20130920
Implantable Lead Location: 753859
Implantable Lead Location: 753860
Implantable Pulse Generator Implant Date: 20130920
Lead Channel Impedance Value: 300 Ohm
Lead Channel Impedance Value: 430 Ohm
Lead Channel Pacing Threshold Amplitude: 0.75 V
Lead Channel Pacing Threshold Amplitude: 0.75 V
Lead Channel Pacing Threshold Pulse Width: 0.4 ms
Lead Channel Pacing Threshold Pulse Width: 1 ms
Lead Channel Sensing Intrinsic Amplitude: 1.5 mV
Lead Channel Sensing Intrinsic Amplitude: 4.7 mV
Lead Channel Setting Pacing Amplitude: 2 V
Lead Channel Setting Pacing Amplitude: 2.5 V
Lead Channel Setting Pacing Pulse Width: 1 ms
Lead Channel Setting Sensing Sensitivity: 1 mV
Pulse Gen Model: 2210
Pulse Gen Serial Number: 7393982

## 2021-10-19 ENCOUNTER — Ambulatory Visit: Payer: Medicare HMO | Admitting: Podiatry

## 2021-10-21 DIAGNOSIS — E1165 Type 2 diabetes mellitus with hyperglycemia: Secondary | ICD-10-CM | POA: Diagnosis not present

## 2021-10-30 ENCOUNTER — Inpatient Hospital Stay (HOSPITAL_COMMUNITY): Payer: Medicare HMO

## 2021-10-30 ENCOUNTER — Emergency Department (HOSPITAL_COMMUNITY): Payer: Medicare HMO

## 2021-10-30 ENCOUNTER — Inpatient Hospital Stay (HOSPITAL_COMMUNITY)
Admission: EM | Admit: 2021-10-30 | Discharge: 2021-11-09 | DRG: 871 | Disposition: E | Payer: Medicare HMO | Attending: Pulmonary Disease | Admitting: Pulmonary Disease

## 2021-10-30 DIAGNOSIS — J439 Emphysema, unspecified: Secondary | ICD-10-CM | POA: Diagnosis not present

## 2021-10-30 DIAGNOSIS — F17210 Nicotine dependence, cigarettes, uncomplicated: Secondary | ICD-10-CM | POA: Diagnosis present

## 2021-10-30 DIAGNOSIS — J90 Pleural effusion, not elsewhere classified: Secondary | ICD-10-CM | POA: Diagnosis not present

## 2021-10-30 DIAGNOSIS — Z9049 Acquired absence of other specified parts of digestive tract: Secondary | ICD-10-CM

## 2021-10-30 DIAGNOSIS — D696 Thrombocytopenia, unspecified: Secondary | ICD-10-CM | POA: Diagnosis present

## 2021-10-30 DIAGNOSIS — K7581 Nonalcoholic steatohepatitis (NASH): Secondary | ICD-10-CM | POA: Diagnosis present

## 2021-10-30 DIAGNOSIS — J9602 Acute respiratory failure with hypercapnia: Secondary | ICD-10-CM | POA: Diagnosis not present

## 2021-10-30 DIAGNOSIS — K922 Gastrointestinal hemorrhage, unspecified: Secondary | ICD-10-CM | POA: Diagnosis present

## 2021-10-30 DIAGNOSIS — R1310 Dysphagia, unspecified: Secondary | ICD-10-CM | POA: Diagnosis present

## 2021-10-30 DIAGNOSIS — Z8701 Personal history of pneumonia (recurrent): Secondary | ICD-10-CM | POA: Diagnosis not present

## 2021-10-30 DIAGNOSIS — R0603 Acute respiratory distress: Secondary | ICD-10-CM | POA: Diagnosis not present

## 2021-10-30 DIAGNOSIS — I110 Hypertensive heart disease with heart failure: Secondary | ICD-10-CM | POA: Diagnosis present

## 2021-10-30 DIAGNOSIS — E872 Acidosis, unspecified: Secondary | ICD-10-CM | POA: Diagnosis not present

## 2021-10-30 DIAGNOSIS — R748 Abnormal levels of other serum enzymes: Secondary | ICD-10-CM

## 2021-10-30 DIAGNOSIS — K3189 Other diseases of stomach and duodenum: Secondary | ICD-10-CM | POA: Diagnosis present

## 2021-10-30 DIAGNOSIS — R404 Transient alteration of awareness: Secondary | ICD-10-CM | POA: Diagnosis not present

## 2021-10-30 DIAGNOSIS — K750 Abscess of liver: Secondary | ICD-10-CM | POA: Diagnosis not present

## 2021-10-30 DIAGNOSIS — I4891 Unspecified atrial fibrillation: Secondary | ICD-10-CM | POA: Diagnosis present

## 2021-10-30 DIAGNOSIS — E1165 Type 2 diabetes mellitus with hyperglycemia: Secondary | ICD-10-CM | POA: Diagnosis present

## 2021-10-30 DIAGNOSIS — Z833 Family history of diabetes mellitus: Secondary | ICD-10-CM

## 2021-10-30 DIAGNOSIS — Z79899 Other long term (current) drug therapy: Secondary | ICD-10-CM

## 2021-10-30 DIAGNOSIS — R112 Nausea with vomiting, unspecified: Secondary | ICD-10-CM | POA: Diagnosis not present

## 2021-10-30 DIAGNOSIS — R4182 Altered mental status, unspecified: Secondary | ICD-10-CM | POA: Diagnosis not present

## 2021-10-30 DIAGNOSIS — N3001 Acute cystitis with hematuria: Secondary | ICD-10-CM | POA: Diagnosis present

## 2021-10-30 DIAGNOSIS — D689 Coagulation defect, unspecified: Secondary | ICD-10-CM | POA: Diagnosis present

## 2021-10-30 DIAGNOSIS — E039 Hypothyroidism, unspecified: Secondary | ICD-10-CM | POA: Diagnosis present

## 2021-10-30 DIAGNOSIS — A4101 Sepsis due to Methicillin susceptible Staphylococcus aureus: Principal | ICD-10-CM | POA: Diagnosis present

## 2021-10-30 DIAGNOSIS — G8929 Other chronic pain: Secondary | ICD-10-CM | POA: Diagnosis present

## 2021-10-30 DIAGNOSIS — R34 Anuria and oliguria: Secondary | ICD-10-CM | POA: Diagnosis present

## 2021-10-30 DIAGNOSIS — Z79890 Hormone replacement therapy (postmenopausal): Secondary | ICD-10-CM

## 2021-10-30 DIAGNOSIS — R7989 Other specified abnormal findings of blood chemistry: Secondary | ICD-10-CM | POA: Diagnosis present

## 2021-10-30 DIAGNOSIS — K4090 Unilateral inguinal hernia, without obstruction or gangrene, not specified as recurrent: Secondary | ICD-10-CM | POA: Diagnosis not present

## 2021-10-30 DIAGNOSIS — K7460 Unspecified cirrhosis of liver: Secondary | ICD-10-CM | POA: Diagnosis present

## 2021-10-30 DIAGNOSIS — N179 Acute kidney failure, unspecified: Secondary | ICD-10-CM | POA: Diagnosis present

## 2021-10-30 DIAGNOSIS — N171 Acute kidney failure with acute cortical necrosis: Secondary | ICD-10-CM | POA: Diagnosis not present

## 2021-10-30 DIAGNOSIS — I442 Atrioventricular block, complete: Secondary | ICD-10-CM | POA: Diagnosis present

## 2021-10-30 DIAGNOSIS — Z66 Do not resuscitate: Secondary | ICD-10-CM | POA: Diagnosis not present

## 2021-10-30 DIAGNOSIS — M19041 Primary osteoarthritis, right hand: Secondary | ICD-10-CM | POA: Diagnosis present

## 2021-10-30 DIAGNOSIS — B999 Unspecified infectious disease: Secondary | ICD-10-CM | POA: Diagnosis not present

## 2021-10-30 DIAGNOSIS — Z809 Family history of malignant neoplasm, unspecified: Secondary | ICD-10-CM

## 2021-10-30 DIAGNOSIS — K219 Gastro-esophageal reflux disease without esophagitis: Secondary | ICD-10-CM | POA: Diagnosis present

## 2021-10-30 DIAGNOSIS — I509 Heart failure, unspecified: Secondary | ICD-10-CM | POA: Diagnosis present

## 2021-10-30 DIAGNOSIS — Z794 Long term (current) use of insulin: Secondary | ICD-10-CM

## 2021-10-30 DIAGNOSIS — Z7901 Long term (current) use of anticoagulants: Secondary | ICD-10-CM

## 2021-10-30 DIAGNOSIS — Z7984 Long term (current) use of oral hypoglycemic drugs: Secondary | ICD-10-CM

## 2021-10-30 DIAGNOSIS — Z85828 Personal history of other malignant neoplasm of skin: Secondary | ICD-10-CM

## 2021-10-30 DIAGNOSIS — A419 Sepsis, unspecified organism: Secondary | ICD-10-CM | POA: Diagnosis not present

## 2021-10-30 DIAGNOSIS — Z888 Allergy status to other drugs, medicaments and biological substances status: Secondary | ICD-10-CM

## 2021-10-30 DIAGNOSIS — G4730 Sleep apnea, unspecified: Secondary | ICD-10-CM | POA: Diagnosis present

## 2021-10-30 DIAGNOSIS — C220 Liver cell carcinoma: Secondary | ICD-10-CM | POA: Diagnosis present

## 2021-10-30 DIAGNOSIS — I951 Orthostatic hypotension: Secondary | ICD-10-CM | POA: Diagnosis present

## 2021-10-30 DIAGNOSIS — R6521 Severe sepsis with septic shock: Secondary | ICD-10-CM | POA: Diagnosis present

## 2021-10-30 DIAGNOSIS — K7689 Other specified diseases of liver: Secondary | ICD-10-CM | POA: Diagnosis not present

## 2021-10-30 DIAGNOSIS — G9341 Metabolic encephalopathy: Secondary | ICD-10-CM | POA: Diagnosis present

## 2021-10-30 DIAGNOSIS — I8510 Secondary esophageal varices without bleeding: Secondary | ICD-10-CM | POA: Diagnosis present

## 2021-10-30 DIAGNOSIS — Z923 Personal history of irradiation: Secondary | ICD-10-CM

## 2021-10-30 DIAGNOSIS — R739 Hyperglycemia, unspecified: Secondary | ICD-10-CM | POA: Diagnosis not present

## 2021-10-30 DIAGNOSIS — Z452 Encounter for adjustment and management of vascular access device: Secondary | ICD-10-CM | POA: Diagnosis not present

## 2021-10-30 DIAGNOSIS — Z950 Presence of cardiac pacemaker: Secondary | ICD-10-CM

## 2021-10-30 DIAGNOSIS — R7881 Bacteremia: Secondary | ICD-10-CM | POA: Diagnosis not present

## 2021-10-30 DIAGNOSIS — R188 Other ascites: Secondary | ICD-10-CM | POA: Diagnosis not present

## 2021-10-30 DIAGNOSIS — M479 Spondylosis, unspecified: Secondary | ICD-10-CM | POA: Diagnosis present

## 2021-10-30 DIAGNOSIS — R6520 Severe sepsis without septic shock: Secondary | ICD-10-CM | POA: Diagnosis present

## 2021-10-30 DIAGNOSIS — M19042 Primary osteoarthritis, left hand: Secondary | ICD-10-CM | POA: Diagnosis present

## 2021-10-30 DIAGNOSIS — D751 Secondary polycythemia: Secondary | ICD-10-CM | POA: Diagnosis present

## 2021-10-30 DIAGNOSIS — Z8673 Personal history of transient ischemic attack (TIA), and cerebral infarction without residual deficits: Secondary | ICD-10-CM

## 2021-10-30 DIAGNOSIS — R4020 Unspecified coma: Secondary | ICD-10-CM | POA: Diagnosis not present

## 2021-10-30 DIAGNOSIS — I499 Cardiac arrhythmia, unspecified: Secondary | ICD-10-CM | POA: Diagnosis not present

## 2021-10-30 DIAGNOSIS — J9601 Acute respiratory failure with hypoxia: Secondary | ICD-10-CM | POA: Diagnosis not present

## 2021-10-30 DIAGNOSIS — Z86010 Personal history of colonic polyps: Secondary | ICD-10-CM

## 2021-10-30 DIAGNOSIS — Z4682 Encounter for fitting and adjustment of non-vascular catheter: Secondary | ICD-10-CM | POA: Diagnosis not present

## 2021-10-30 DIAGNOSIS — J189 Pneumonia, unspecified organism: Secondary | ICD-10-CM

## 2021-10-30 LAB — URINALYSIS, ROUTINE W REFLEX MICROSCOPIC
Bilirubin Urine: NEGATIVE
Glucose, UA: >=500 mg/dL — AB
Ketones, ur: NEGATIVE mg/dL
Leukocytes,Ua: NEGATIVE
Nitrite: POSITIVE — AB
Protein, ur: >=300 mg/dL — AB
Specific Gravity, Urine: 1.027 (ref 1.005–1.030)
pH: 6 (ref 5.0–8.0)

## 2021-10-30 LAB — CBC WITH DIFFERENTIAL/PLATELET
Abs Immature Granulocytes: 0.04 10*3/uL (ref 0.00–0.07)
Basophils Absolute: 0 10*3/uL (ref 0.0–0.1)
Basophils Relative: 0 %
Eosinophils Absolute: 0 10*3/uL (ref 0.0–0.5)
Eosinophils Relative: 0 %
HCT: 58.8 % — ABNORMAL HIGH (ref 39.0–52.0)
Hemoglobin: 19.3 g/dL — ABNORMAL HIGH (ref 13.0–17.0)
Immature Granulocytes: 1 %
Lymphocytes Relative: 3 %
Lymphs Abs: 0.2 10*3/uL — ABNORMAL LOW (ref 0.7–4.0)
MCH: 32.7 pg (ref 26.0–34.0)
MCHC: 32.8 g/dL (ref 30.0–36.0)
MCV: 99.7 fL (ref 80.0–100.0)
Monocytes Absolute: 0.5 10*3/uL (ref 0.1–1.0)
Monocytes Relative: 6 %
Neutro Abs: 7 10*3/uL (ref 1.7–7.7)
Neutrophils Relative %: 90 %
Platelets: 24 10*3/uL — CL (ref 150–400)
RBC: 5.9 MIL/uL — ABNORMAL HIGH (ref 4.22–5.81)
RDW: 14.4 % (ref 11.5–15.5)
Smear Review: DECREASED
WBC: 7.7 10*3/uL (ref 4.0–10.5)
nRBC: 0 % (ref 0.0–0.2)

## 2021-10-30 LAB — CBG MONITORING, ED
Glucose-Capillary: 436 mg/dL — ABNORMAL HIGH (ref 70–99)
Glucose-Capillary: 370 mg/dL — ABNORMAL HIGH (ref 70–99)
Glucose-Capillary: 326 mg/dL — ABNORMAL HIGH (ref 70–99)

## 2021-10-30 LAB — COMPREHENSIVE METABOLIC PANEL
ALT: <5 U/L (ref 0–44)
AST: 53 U/L — ABNORMAL HIGH (ref 15–41)
Albumin: 3 g/dL — ABNORMAL LOW (ref 3.5–5.0)
Alkaline Phosphatase: 181 U/L — ABNORMAL HIGH (ref 38–126)
Anion gap: 24 — ABNORMAL HIGH (ref 5–15)
BUN: 60 mg/dL — ABNORMAL HIGH (ref 8–23)
CO2: 17 mmol/L — ABNORMAL LOW (ref 22–32)
Calcium: 8.7 mg/dL — ABNORMAL LOW (ref 8.9–10.3)
Chloride: 95 mmol/L — ABNORMAL LOW (ref 98–111)
Creatinine, Ser: 2.85 mg/dL — ABNORMAL HIGH (ref 0.61–1.24)
GFR, Estimated: 23 mL/min — ABNORMAL LOW (ref >60)
Glucose, Bld: 459 mg/dL — ABNORMAL HIGH (ref 70–99)
Potassium: 3.8 mmol/L (ref 3.5–5.1)
Sodium: 136 mmol/L (ref 135–145)
Total Bilirubin: 2.9 mg/dL — ABNORMAL HIGH (ref 0.3–1.2)
Total Protein: 7.6 g/dL (ref 6.5–8.1)

## 2021-10-30 LAB — PROTIME-INR
INR: 1.4 — ABNORMAL HIGH (ref 0.8–1.2)
Prothrombin Time: 16.5 seconds — ABNORMAL HIGH (ref 11.4–15.2)

## 2021-10-30 LAB — TROPONIN I (HIGH SENSITIVITY)
Troponin I (High Sensitivity): 331 ng/L (ref <18)
Troponin I (High Sensitivity): 290 ng/L (ref <18)

## 2021-10-30 LAB — CK: Total CK: 366 U/L (ref 49–397)

## 2021-10-30 LAB — BLOOD GAS, VENOUS
Acid-base deficit: 9.2 mmol/L — ABNORMAL HIGH (ref 0.0–2.0)
Bicarbonate: 16.2 mmol/L — ABNORMAL LOW (ref 20.0–28.0)
Drawn by: 27016
FIO2: 100 %
O2 Saturation: 86 %
Patient temperature: 38.3
pCO2, Ven: 35 mmHg — ABNORMAL LOW (ref 44–60)
pH, Ven: 7.28 (ref 7.25–7.43)
pO2, Ven: 69 mmHg — ABNORMAL HIGH (ref 32–45)

## 2021-10-30 LAB — BLOOD GAS, ARTERIAL
Acid-base deficit: 8.3 mmol/L — ABNORMAL HIGH (ref 0.0–2.0)
Bicarbonate: 22.1 mmol/L (ref 20.0–28.0)
Drawn by: 10555
FIO2: 100 %
O2 Saturation: 93.6 %
Patient temperature: 33
pCO2 arterial: 57 mmHg — ABNORMAL HIGH (ref 32–48)
pH, Arterial: 7.17 — CL (ref 7.35–7.45)
pO2, Arterial: 73 mmHg — ABNORMAL LOW (ref 83–108)

## 2021-10-30 LAB — BRAIN NATRIURETIC PEPTIDE: B Natriuretic Peptide: 740 pg/mL — ABNORMAL HIGH (ref 0.0–100.0)

## 2021-10-30 LAB — MAGNESIUM: Magnesium: 2.8 mg/dL — ABNORMAL HIGH (ref 1.7–2.4)

## 2021-10-30 LAB — GLUCOSE, CAPILLARY: Glucose-Capillary: 243 mg/dL — ABNORMAL HIGH (ref 70–99)

## 2021-10-30 LAB — LACTIC ACID, PLASMA
Lactic Acid, Venous: >9 mmol/L (ref 0.5–1.9)
Lactic Acid, Venous: 6.8 mmol/L (ref 0.5–1.9)

## 2021-10-30 LAB — BETA-HYDROXYBUTYRIC ACID: Beta-Hydroxybutyric Acid: 0.29 mmol/L — ABNORMAL HIGH (ref 0.05–0.27)

## 2021-10-30 LAB — APTT: aPTT: 34 seconds (ref 24–36)

## 2021-10-30 LAB — TSH: TSH: 2.032 u[IU]/mL (ref 0.350–4.500)

## 2021-10-30 LAB — AMMONIA: Ammonia: 58 umol/L — ABNORMAL HIGH (ref 9–35)

## 2021-10-30 MED ORDER — ACETAMINOPHEN 650 MG RE SUPP
650.0000 mg | Freq: Once | RECTAL | Status: AC
  Administered 2021-10-30: 650 mg via RECTAL
  Filled 2021-10-30: qty 1

## 2021-10-30 MED ORDER — POLYETHYLENE GLYCOL 3350 17 G PO PACK
17.0000 g | PACK | Freq: Every day | ORAL | Status: DC
  Administered 2021-10-31: 17 g
  Filled 2021-10-30: qty 1

## 2021-10-30 MED ORDER — CEFEPIME HCL 2 G IV SOLR
2.0000 g | Freq: Once | INTRAVENOUS | Status: AC
  Administered 2021-10-30: 2 g via INTRAVENOUS
  Filled 2021-10-30: qty 12.5

## 2021-10-30 MED ORDER — ETOMIDATE 2 MG/ML IV SOLN
INTRAVENOUS | Status: AC
  Filled 2021-10-30: qty 20

## 2021-10-30 MED ORDER — PROPOFOL 1000 MG/100ML IV EMUL
0.0000 ug/kg/min | INTRAVENOUS | Status: DC
  Administered 2021-10-31: 5 ug/kg/min via INTRAVENOUS
  Filled 2021-10-30: qty 100

## 2021-10-30 MED ORDER — PROPOFOL 1000 MG/100ML IV EMUL
5.0000 ug/kg/min | INTRAVENOUS | Status: DC
  Filled 2021-10-30: qty 100

## 2021-10-30 MED ORDER — LACTATED RINGERS IV BOLUS
1000.0000 mL | Freq: Once | INTRAVENOUS | Status: AC
Start: 2021-10-30 — End: 2021-10-31
  Administered 2021-10-31: 1000 mL via INTRAVENOUS

## 2021-10-30 MED ORDER — LACTATED RINGERS IV BOLUS
20.0000 mL/kg | Freq: Once | INTRAVENOUS | Status: AC
Start: 2021-10-30 — End: 2021-10-30
  Administered 2021-10-30: 2050 mL via INTRAVENOUS

## 2021-10-30 MED ORDER — ORAL CARE MOUTH RINSE: 15.0000 mL | OROMUCOSAL | Status: DC | PRN

## 2021-10-30 MED ORDER — PROPOFOL 1000 MG/100ML IV EMUL
INTRAVENOUS | Status: AC
  Administered 2021-10-30: 30 ug/kg/min
  Filled 2021-10-30: qty 100

## 2021-10-30 MED ORDER — VANCOMYCIN HCL IN DEXTROSE 1-5 GM/200ML-% IV SOLN: 1000.0000 mg | Freq: Once | INTRAVENOUS | Status: DC

## 2021-10-30 MED ORDER — ETOMIDATE 2 MG/ML IV SOLN
INTRAVENOUS | Status: AC | PRN
  Administered 2021-10-30: 20 mg via INTRAVENOUS

## 2021-10-30 MED ORDER — LACTATED RINGERS IV BOLUS (SEPSIS)
500.0000 mL | Freq: Once | INTRAVENOUS | Status: AC
  Administered 2021-10-30: 500 mL via INTRAVENOUS

## 2021-10-30 MED ORDER — FENTANYL CITRATE PF 50 MCG/ML IJ SOSY: 25.0000 ug | PREFILLED_SYRINGE | INTRAMUSCULAR | Status: DC | PRN

## 2021-10-30 MED ORDER — SODIUM CHLORIDE 0.9 % IV SOLN: 2.0000 g | INTRAVENOUS | Status: DC

## 2021-10-30 MED ORDER — INSULIN REGULAR(HUMAN) IN NACL 100-0.9 UT/100ML-% IV SOLN
INTRAVENOUS | Status: AC
Start: 2021-10-30 — End: 2021-10-31
  Administered 2021-10-30: 8 [IU]/h via INTRAVENOUS
  Filled 2021-10-30: qty 100

## 2021-10-30 MED ORDER — VANCOMYCIN HCL 2000 MG/400ML IV SOLN
2000.0000 mg | Freq: Once | INTRAVENOUS | Status: AC
  Administered 2021-10-30: 2000 mg via INTRAVENOUS
  Filled 2021-10-30: qty 400

## 2021-10-30 MED ORDER — SODIUM CHLORIDE 0.9% FLUSH
10.0000 mL | Freq: Two times a day (BID) | INTRAVENOUS | Status: DC
  Administered 2021-10-30 – 2021-10-31 (×2): 10 mL

## 2021-10-30 MED ORDER — CHLORHEXIDINE GLUCONATE CLOTH 2 % EX PADS
6.0000 | MEDICATED_PAD | Freq: Every day | CUTANEOUS | Status: DC
  Administered 2021-10-30: 6 via TOPICAL

## 2021-10-30 MED ORDER — ROCURONIUM BROMIDE 50 MG/5ML IV SOLN
INTRAVENOUS | Status: AC | PRN
  Administered 2021-10-30: 100 mg via INTRAVENOUS

## 2021-10-30 MED ORDER — PIPERACILLIN-TAZOBACTAM 3.375 G IVPB
3.3750 g | Freq: Three times a day (TID) | INTRAVENOUS | Status: DC
  Administered 2021-10-31 (×2): 3.375 g via INTRAVENOUS
  Filled 2021-10-30 (×2): qty 50

## 2021-10-30 MED ORDER — LACTATED RINGERS IV SOLN: INTRAVENOUS | Status: DC

## 2021-10-30 MED ORDER — DOCUSATE SODIUM 50 MG/5ML PO LIQD
100.0000 mg | Freq: Two times a day (BID) | ORAL | Status: DC
Start: 2021-10-30 — End: 2021-11-01
  Administered 2021-10-31: 100 mg
  Filled 2021-10-30: qty 10

## 2021-10-30 MED ORDER — NOREPINEPHRINE 4 MG/250ML-% IV SOLN
0.0000 ug/min | INTRAVENOUS | Status: DC
  Administered 2021-10-30: 2 ug/min via INTRAVENOUS
  Administered 2021-10-31: 12 ug/min via INTRAVENOUS
  Filled 2021-10-30: qty 250

## 2021-10-30 MED ORDER — VANCOMYCIN HCL 1250 MG/250ML IV SOLN: 1250.0000 mg | INTRAVENOUS | Status: DC

## 2021-10-30 MED ORDER — ROCURONIUM BROMIDE 10 MG/ML (PF) SYRINGE
PREFILLED_SYRINGE | INTRAVENOUS | Status: AC
  Filled 2021-10-30: qty 10

## 2021-10-30 MED ORDER — DEXTROSE 50 % IV SOLN: 0.0000 mL | INTRAVENOUS | Status: DC | PRN

## 2021-10-30 MED ORDER — SUCCINYLCHOLINE CHLORIDE 200 MG/10ML IV SOSY
PREFILLED_SYRINGE | INTRAVENOUS | Status: AC
  Filled 2021-10-30: qty 10

## 2021-10-30 MED ORDER — DEXTROSE IN LACTATED RINGERS 5 % IV SOLN: INTRAVENOUS | Status: AC

## 2021-10-30 MED ORDER — SODIUM CHLORIDE 0.9 % IV BOLUS
1000.0000 mL | Freq: Once | INTRAVENOUS | Status: AC
  Administered 2021-10-30: 1000 mL via INTRAVENOUS

## 2021-10-30 MED ORDER — ORAL CARE MOUTH RINSE
15.0000 mL | OROMUCOSAL | Status: DC
  Administered 2021-10-30 – 2021-10-31 (×8): 15 mL via OROMUCOSAL

## 2021-10-30 MED ORDER — SODIUM CHLORIDE 0.9% FLUSH: 10.0000 mL | INTRAVENOUS | Status: DC | PRN

## 2021-10-30 MED ORDER — METRONIDAZOLE 500 MG/100ML IV SOLN
500.0000 mg | Freq: Once | INTRAVENOUS | Status: AC
  Administered 2021-10-30: 500 mg via INTRAVENOUS
  Filled 2021-10-30: qty 100

## 2021-10-30 MED ORDER — POTASSIUM CHLORIDE 10 MEQ/100ML IV SOLN
10.0000 meq | INTRAVENOUS | Status: AC
  Administered 2021-10-30 (×2): 10 meq via INTRAVENOUS
  Filled 2021-10-30 (×2): qty 100

## 2021-10-30 MED ORDER — PANTOPRAZOLE 2 MG/ML SUSPENSION: 40.0000 mg | Freq: Every day | ORAL | Status: DC

## 2021-10-30 MED ORDER — NOREPINEPHRINE 4 MG/250ML-% IV SOLN
INTRAVENOUS | Status: AC
  Filled 2021-10-30: qty 250

## 2021-10-30 MED ORDER — SODIUM BICARBONATE 8.4 % IV SOLN
100.0000 meq | Freq: Once | INTRAVENOUS | Status: AC
  Administered 2021-10-30: 100 meq via INTRAVENOUS

## 2021-10-30 NOTE — ED Provider Notes (Incomplete)
[facility] Provider Note   CSN: [id number] Arrival date & time: 11/01/2021  1608     History {Add pertinent medical, surgical, social history, OB history to HPI:1} Chief Complaint  Patient presents with  . Respiratory Distress    Ralph [name] is a 71 y.o. male with a past medical history significant for CHF, atrial flutter with anticoagulation on Xarelto, history of current pleat heart block status post pacemaker, insulin-dependent type 2 diabetes mellitus, orthostatic hypotension, history of liver cancer, cirrhosis, esophageal varices, history of stroke who presents via EMS for respiratory distress and altered mental status.  EMS reports that it has been greater than 12 hours since anyone has seen the patient.  His wife found him draped over the bed, unresponsive to voice or stimulus and breathing rapidly with his legs hanging off the edge of the bed for an unknown amount of time.  EMS reports that the patient appears to be in SVT but they did not give adenosine prior to arrival.  Review of medication shows that the patient is on flecainide, Xarelto and diltiazem unsure of last medication dosages.  Patient also has a blood sugar of 409 prior to arrival.  Patient unable to provide any history.  HPI     Home Medications Prior to Admission medications   Medication Sig Start Date End Date Taking? Authorizing Provider  ACCU-CHEK GUIDE test strip TEST TWICE A DAY BEFORE MEALS 01/13/20   Shamleffer, Melanie Crazier, MD  acetaminophen (TYLENOL) 500 MG tablet Take 500 mg by mouth every 6 (six) hours as needed for moderate pain.    [provider]  Blood Glucose Monitoring Suppl (ACCU-CHEK AVIVA) device by Other route. Use as instructed to check blood sugar 2 times daily    [provider]  ciclopirox (PENLAC) 8 % solution APPLY TOPICALLY AT BEDTIME. APPLY OVER NAIL AND SURROUNDING SKIN. APPLY DAILY OVER PREVIOUS COAT. AFTER SEVEN (7) DAYS, MAY REMOVE WITH  ALCOHOL AND CONTINUE CYCLE. 07/14/21   Lorenda Peck, DPM  Continuous Blood Gluc Receiver (DEXCOM G6 RECEIVER) DEVI Use as instruct to check blood sugar daily 05/18/20   Shamleffer, Melanie Crazier, MD  Continuous Blood Gluc Sensor (DEXCOM G6 SENSOR) MISC 1 Device by Does not apply route as directed. 04/30/20   Shamleffer, Melanie Crazier, MD  Continuous Blood Gluc Transmit (DEXCOM G6 TRANSMITTER) MISC 1 Device by Does not apply route as directed. 04/30/20   Shamleffer, Melanie Crazier, MD  dapagliflozin propanediol (FARXIGA) 10 MG TABS tablet Take 1 tablet (10 mg total) by mouth daily. 05/03/21   Shamleffer, Melanie Crazier, MD  diazepam (VALIUM) 10 MG tablet Take 1 tablet by mouth every 8 (eight) hours as needed (muscle spasms). 05/20/19   [provider]  diltiazem (CARDIZEM CD) 300 MG 24 hr capsule TAKE 1 CAPSULE BY MOUTH EVERY DAY 07/12/21   Arnoldo Lenis, MD  escitalopram (LEXAPRO) 10 MG tablet Take 10 mg by mouth daily. 04/19/19   [provider]  flecainide (TAMBOCOR) 100 MG tablet TAKE 1 TABLET BY MOUTH TWICE A DAY 05/21/21   Evans Lance, MD  furosemide (LASIX) 40 MG tablet Take 40 mg by mouth daily as needed for fluid or edema.    [provider]  Insulin Aspart FlexPen (NOVOLOG) 100 UNIT/ML Max daily 50 units Patient taking differently: Inject 10-14 Units into the skin 3 (three) times daily before meals. Max daily 50 units 05/03/21   Shamleffer, Melanie Crazier, MD  Insulin Degludec FlexTouch 100 UNIT/ML SOPN Inject 32  Units into the skin daily. 05/03/21   Shamleffer, Melanie Crazier, MD  Insulin Pen Needle 31G X 5 MM MISC 1 Device by Does not apply route in the morning, at noon, in the evening, and at bedtime. 05/03/21   Shamleffer, Melanie Crazier, MD  levothyroxine (SYNTHROID, LEVOTHROID) 112 MCG tablet TAKE 1 TABLET BY MOUTH EVERY MORNING BEFORE BREAKFAST 04/24/17   Cassandria Anger, MD  metFORMIN (GLUCOPHAGE) 1000 MG tablet Take 1 tablet (1,000 mg total) by  mouth 2 (two) times daily with a meal. 05/03/21   Shamleffer, Melanie Crazier, MD  Omega-3 Fatty Acids (FISH OIL) 1000 MG CAPS Take 1,000 mg by mouth in the morning and at bedtime.    [provider]  oxymetazoline (AFRIN) 0.05 % nasal spray Place 1 spray into both nostrils 2 (two) times daily as needed for congestion. Sinex brand    [provider]  pantoprazole (PROTONIX) 40 MG tablet Take 1 tablet (40 mg total) by mouth daily. 03/01/21   Erenest Rasher, PA-C  Polyethyl Glycol-Propyl Glycol (SYSTANE) 0.4-0.3 % SOLN Place 1 drop into both eyes every 6 (six) hours as needed (dry eyes).    [provider]  polyethylene glycol-electrolytes (TRILYTE) 420 g solution Take 4,000 mLs by mouth as directed. 08/24/21   Rourk, [name], MD  propranolol (INDERAL) 20 MG tablet TAKE 1 TABLET BY MOUTH TWICE A DAY 02/23/21   Mahala Menghini, PA-C  rivaroxaban (XARELTO) 20 MG TABS tablet TAKE 1 TABLET BY MOUTH EVERY DAY WITH DINNER 05/09/18   Evans Lance, MD  simvastatin (ZOCOR) 20 MG tablet TAKE 1 TABLET BY MOUTH EVERY DAY IN THE EVENING 09/16/21   Arnoldo Lenis, MD  spironolactone (ALDACTONE) 50 MG tablet TAKE 1 TABLET BY MOUTH TWICE A DAY 02/23/21   Mahala Menghini, PA-C      Allergies    Nitroglycerin    Review of Systems   Review of Systems  Physical Exam Updated Vital Signs There were no vitals taken for this visit. Physical Exam Vitals reviewed.  Constitutional:      Appearance: He is ill-appearing and toxic-appearing.  HENT:     Head: Normocephalic.     Comments: Cyanotic appearing features with purple nose, blue lips    Mouth/Throat:     Mouth: Mucous membranes are dry.  Eyes:     Pupils: Pupils are equal, round, and reactive to light.     Comments: Rapid bilateral nystagmus noted in the eyes  Cardiovascular:     Rate and Rhythm: Tachycardia present. Rhythm irregular.     Comments: Distal extremities are cool, he has mottling in the lower extremities.  He  is hot to the touch near his bottom which he also has stage I pressure injury. Pulmonary:     Comments: Patient tachypneic into the mid 40s breathing deeply and rapidly Abdominal:     General: There is no distension.     Comments: Dexcom in place in the right lower quadrant of the abdomen, multiple bruises on the abdomen suggestive of injection sites  Musculoskeletal:     Cervical back: Neck supple.     Comments: Patient moving extremities with purpose no obvious deformities  Neurological:     GCS: GCS eye subscore is 4. GCS verbal subscore is 2. GCS motor subscore is 6.     Comments: Patient is confused, he does not answer purposefully to questions but does follow some directions such as raising up his hands and holding them when prompted  repeatedly.      ED Results / Procedures / Treatments   Labs (all labs ordered are listed, but only abnormal results are displayed) Labs Reviewed  CBG MONITORING, ED - Abnormal; Notable for the following components:      Result Value   Glucose-Capillary 436 (*)    All other components within normal limits  RESP PANEL BY RT-PCR (FLU A&B, COVID) ARPGX2  CULTURE, BLOOD (ROUTINE X 2)  CULTURE, BLOOD (ROUTINE X 2)  URINE CULTURE  LACTIC ACID, PLASMA  LACTIC ACID, PLASMA  COMPREHENSIVE METABOLIC PANEL  CBC WITH DIFFERENTIAL/PLATELET  PROTIME-INR  APTT  URINALYSIS, ROUTINE W REFLEX MICROSCOPIC  BLOOD GAS, VENOUS  BRAIN NATRIURETIC PEPTIDE  AMMONIA  CK  MAGNESIUM  TSH  I-STAT CHEM 8, ED  TROPONIN I (HIGH SENSITIVITY)    EKG None  Radiology No results found.  Procedures .Critical Care  Performed by: Margarita Mail, PA-C Authorized by: Margarita Mail, PA-C   Critical care provider statement:    Critical care time (minutes):  100   Critical care time was exclusive of:  Separately billable procedures and treating other patients   Critical care was necessary to treat or prevent imminent or life-threatening deterioration of the  following conditions:  Sepsis, shock, respiratory failure, CNS failure or compromise, circulatory failure and dehydration   Critical care was time spent personally by me on the following activities:  Development of treatment plan with patient or surrogate, discussions with consultants, evaluation of patient's response to treatment, examination of patient, ordering and review of laboratory studies, ordering and review of radiographic studies, ordering and performing treatments and interventions, pulse oximetry, re-evaluation of patient's condition and review of old charts   {Document cardiac monitor, telemetry assessment procedure when appropriate:1}  Medications Ordered in ED Medications  lactated ringers infusion (has no administration in time range)  lactated ringers bolus 500 mL (has no administration in time range)  ceFEPIme (MAXIPIME) 2 g in sodium chloride 0.9 % 100 mL IVPB (has no administration in time range)  metroNIDAZOLE (FLAGYL) IVPB 500 mg (has no administration in time range)  vancomycin (VANCOCIN) IVPB 1000 mg/200 mL premix (has no administration in time range)  sodium chloride 0.9 % bolus 1,000 mL (has no administration in time range)  sodium chloride 0.9 % bolus 1,000 mL (has no administration in time range)    ED Course/ Medical Decision Making/ A&P Clinical Course as of 11/08/2021 2147  Sat Oct 30, 2021  1634 This is a 71 year old male who arrives critically ill.  Patient has core temperature noted to be 101.1.  I reviewed the patient's EKG at bedside which appears to be a flutter or fib with rapid ventricular rate between 170 and 190.  Patient's initial oxygen saturation prior to arrival noted to be 77 on room air.  He is currently on nonrebreather at 15 L. The differential diagnosis is broad for this patient.  Certainly sepsis is a large concern and may be driving patient's rapid ventricular response.  Patient also with noted mice nystagmus and hypoxia.  I have concern he could  have intracranial hemorrhage and potentially could have vomited and aspirated.  DKA is also a concern.  Patient also has a history of liver cancer.  He has a history of esophageal varices and may have any number of bleeding concerns.  Patient could potentially also have a pulmonary embolus, brain tumors.  Slew of tests are currently pending including VBG ammonia CK thyroid levels mag levels in standard sepsis work-up.  Patient receiving  fluids, IV antibiotics.  Patient is critically ill and may require multiple interventions including intubation or venous line. [AH]  1641 I personally reviewed patient's chest x-ray.  Radiologic report is pending.  It does not appear to be an acute infiltrate.  Lung findings are abnormal.  Does appear to have some baseline lung disease. [AH]  1641 EKG 12-Lead [AH]  1642 I visualized and interpreted EKGs.  Patient is in A-fib with a rapid ventricular rate noted to be 182. [AH]  1728 Patient successfully intubated [AH]  1906 Lactic Acid, Venous(!!): 6.8 [AH]  1932 Platelets(!!): 24 [AH]  2115 pH, Arterial(!!): 7.17 Sepsis - Repeat Assessment   Vitals     '@VITALSNOTREFRESHABLE'$ @  Heart:     Irregular rate and rhythm and Tachycardic  Lungs:    Rhonchi  Capillary Refill:   > 2 sec  Peripheral Pulse:   Radial pulse palpable  Skin:     Cyanotic and Mottled After sepsis recheck patient has mottled, cool extremities which appear to be more cyanotic.  Patient is on Levophed and current map is 89.  He has worsening lactic acidosis despite improving lactic acid levels.  I have asked that the nurse turn his Levophed down to goal map of about 65-67 and have ordered an amp of bicarb.  Patient is stable for transport and I reevaluated him just prior to department by [facility]    [AH]    Clinical Course User Index [AH] Margarita Mail, PA-C                           Medical Decision Making This patient presents to the ED for concern of altered mental status, this  involves an extensive number of treatment options, and is a complaint that carries with it a high risk of complications and morbidity.  The differential diagnosis includes The differential diagnosis for AMS is extensive and includes, but is not limited to: drug overdose - opioids, alcohol, sedatives, antipsychotics, drug withdrawal, others; Metabolic: hypoxia, hypoglycemia, hyperglycemia, hypercalcemia, hypernatremia, hyponatremia, uremia, hepatic encephalopathy, hypothyroidism, hyperthyroidism, vitamin B12 or thiamine deficiency, carbon monoxide poisoning, Wilson's disease, Lactic acidosis, DKA/HHOS; Infectious: meningitis, encephalitis, bacteremia/sepsis, urinary tract infection, pneumonia, neurosyphilis; Structural: Space-occupying lesion, (brain tumor, subdural hematoma, hydrocephalus,); Vascular: stroke, subarachnoid hemorrhage, coronary ischemia, hypertensive encephalopathy, CNS vasculitis, thrombotic thrombocytopenic purpura, disseminated intravascular coagulation, hyperviscosity; Psychiatric: Schizophrenia, depression; Other: Seizure, hypothermia, heat stroke, ICU psychosis, dementia -"sundowning."     Co morbidities that complicate the patient evaluation       ***   Additional history obtained:  Additional history obtained from *** External records from outside source obtained and reviewed including ***   Lab Tests:  I Ordered, and personally interpreted labs.  The pertinent results include:  ***    Imaging Studies ordered:  I ordered imaging studies including *** I independently visualized and interpreted imaging which showed *** I agree with the radiologist interpretation   Cardiac Monitoring:       The patient was maintained on a cardiac monitor.  I personally viewed and interpreted the cardiac monitored which showed an underlying rhythm of: ***   Medicines ordered and prescription drug management:  I ordered medication including ***  for *** Reevaluation of the  patient after these medicines showed that the patient {resolved/improved/worsened:23923::"improved"} I have reviewed the patients home medicines and have made adjustments as needed   Test Considered:       ***   Critical Interventions:       ***  Consultations Obtained:  I requested consultation with the ***,  and discussed lab and imaging findings as well as pertinent plan - they recommend: ***   Problem List / ED Course:       ***   Reevaluation:  After the interventions noted above, I reevaluated the patient and found that they have :{resolved/improved/worsened:23923::"improved"}   Social Determinants of Health:       ***   Dispostion:  After consideration of the diagnostic results and the patients response to treatment, I feel that the patent would benefit from ***.    Amount and/or Complexity of Data Reviewed Independent Historian: spouse Labs: ordered. Decision-making details documented in ED Course. Radiology: ordered and independent interpretation performed. ECG/medicine tests: ordered and independent interpretation performed. Decision-making details documented in ED Course.  Risk OTC drugs. Prescription drug management.   ***  {Document critical care time when appropriate:1} {Document review of labs and clinical decision tools ie heart score, Chads2Vasc2 etc:1}  {Document your independent review of radiology images, and any outside records:1} {Document your discussion with family members, caretakers, and with consultants:1} {Document social determinants of health affecting pt's care:1} {Document your decision making why or why not admission, treatments were needed:1} Final Clinical Impression(s) / ED Diagnoses Final diagnoses:  None    Rx / DC Orders ED Discharge Orders     None

## 2021-10-30 NOTE — Progress Notes (Signed)
Pharmacy Antibiotic Note  Ralph [name] is a 71 y.o. male admitted on 10/13/2021 with septic shock and concern for liver abscess.  Pharmacy has been consulted to change cefepime to Zosyn.  Plan: Rec'd cefepime and metronidazole in ED. Zosyn 3.375g IV q8h (4 hour infusion).  Height: '5\' 10"'$  (177.8 cm) Weight: 97.4 kg (214 lb 11.7 oz) IBW/kg (Calculated) : 73  Temp (24hrs), Avg:101.2 F (38.4 C), Min:101.1 F (38.4 C), Max:101.2 F (38.4 C)  Recent Labs  Lab 11/02/2021 1614 11/02/2021 1749  WBC 7.7  --   CREATININE 2.85*  --   LATICACIDVEN >9.0* 6.8*    Estimated Creatinine Clearance: 29.1 mL/min (A) (by C-G formula based on SCr of 2.85 mg/dL (H)).    Allergies  Allergen Reactions   Nitroglycerin Hives, Swelling and Rash    Thank you for allowing pharmacy to be a part of this patient's care.  Wynona Neat, PharmD, BCPS  10/15/2021 11:04 PM

## 2021-10-30 NOTE — Sepsis Progress Note (Signed)
[facility] monitoring code sepsis 

## 2021-10-30 NOTE — Progress Notes (Signed)
[facility] Physician-Brief Progress Note Patient Name: Ralph [name] DOB: 1950-07-25 MRN: [id number]   Date of Service  11/04/2021  HPI/Events of Note  71 yr old man with septic shock and respiratory failure admitted to ICU from ER. Has arrived intubated and on PRVC VT 580, RR 32, peep 5, fio2 60. On levophed. MAP is 67 on 8 mic/min. O2 sat 95.   eICU Interventions  PCCM admitting, they are placing notes Fresh labs being sent Seen on camera Call E link if needed.      Intervention Category Major Interventions: Respiratory failure - evaluation and management;Shock - evaluation and management Evaluation Type: New Patient Evaluation  Margaretmary Lombard 11/05/2021, 10:40 PM

## 2021-10-30 NOTE — ED Notes (Signed)
Wife on phone. EDP to talk to family.

## 2021-10-30 NOTE — Progress Notes (Signed)
Pharmacy Antibiotic Note  Ralph [name] is a 71 y.o. male admitted on 11/04/2021 with  unknown source of infection .  Pharmacy has been consulted for Vancomycin and cefepime dosing. AKI, scr 2.85, baseline 0.6-0.7  Plan: Vancomycin 2000 mg IV loading dose, then '1250mg'$  IV Q 48 hrs. Goal AUC 400-550. Expected AUC: 477 SCr used: 2.85  Cefepime 2gm IV q24 F/u cxs and clinical progress Monitor V/S, labs and levels as indicated  Temp (24hrs), Avg:101.1 F (38.4 C), Min:101.1 F (38.4 C), Max:101.1 F (38.4 C)  Recent Labs  Lab 11/03/2021 1614  WBC 7.7  CREATININE 2.85*  LATICACIDVEN >9.0*    Normalized CrCl is 103ms/min Estimated Creatinine Clearance: 29.8 mL/min (A) (by C-G formula based on SCr of 2.85 mg/dL (H)).    Allergies  Allergen Reactions   Nitroglycerin Hives, Swelling and Rash    Antimicrobials this admission: Vancomycin 7/22 >>  Cefepime  7/22 >>   Microbiology results: 7/22 BCx: pending 7/22 UCx: pending   MRSA PCR:   Thank you for allowing pharmacy to be a part of this patient's care.  LIsac Sarna BS Pharm D, BCPS Clinical Pharmacist 10/12/2021 6:05 PM

## 2021-10-30 NOTE — ED Triage Notes (Signed)
Pt arrived by [facility] for unresponsiveness. Per EMS, pt has not gotten out of bed in 4 days and was found by wife dangling off the bed. A fib RVR on monitor, oxygen saturation in the 80s nonrebreather

## 2021-10-30 NOTE — ED Notes (Signed)
Both sets of blood cultures drawn before antibiotic administration  

## 2021-10-30 NOTE — ED Notes (Signed)
[facility] arrived  

## 2021-10-30 NOTE — ED Provider Notes (Signed)
This patient is a severely ill, critically ill-appearing virtually unresponsive 71 year old male presenting by paramedic transport after he was found to be virtually unresponsive at home.  Paramedics found the patient to be hyperglycemic and severe atrial fibrillation with rapid ventricular rate, they did not give any rate control medications and did not give adenosine thankfully.  The patient is unresponsive unable to answer any questions but is able to move all 4 extremities to pain and semipurposeful he.  He appears to be awake but not answering questions, he is groaning, he is mottled, he is hot to the touch and has a rectal temperature of 101+.  He is tachycardic to 190 and atrial fibrillation with rapid ventricular rate.  Review of his medications shows that he is an insulin requiring diabetic who is also on diltiazem, flecainide and Xarelto.  It is unclear when the patient last had his medications, family is not here to corroborate, this patient is critically ill, may require advanced procedures including intubation and/or central line and/or other things as needed as work-up pursues but at this time should be able to tolerate fluids, antibiotics for presumed sepsis, insulin etc.  Will need frequent reevaluations.  Linus Salmons Line  Date/Time: Nov 18, 2021 3:35 PM  Performed by: Noemi Chapel, MD Authorized by: Noemi Chapel, MD   Consent:    Consent obtained:  Emergent situation Universal protocol:    Immediately prior to procedure, a time out was called: yes     Patient identity confirmed:  Arm band Pre-procedure details:    Indication(s): central venous access and insufficient peripheral access     Hand hygiene: Hand hygiene performed prior to insertion     Sterile barrier technique: All elements of maximal sterile technique followed     Skin preparation:  Chlorhexidine   Skin preparation agent: Skin preparation agent completely dried prior to procedure   Anesthesia:    Anesthesia method:   None Procedure details:    Location:  R internal jugular   Patient position:  Supine   Procedural supplies:  Triple lumen   Catheter size:  7 Fr   Landmarks identified: yes     Ultrasound guidance: yes     Ultrasound guidance timing: real time     Sterile ultrasound techniques: Sterile gel and sterile probe covers were used     Number of attempts:  1   Successful placement: yes   Post-procedure details:    Post-procedure:  Dressing applied and line sutured   Assessment:  Blood return through all ports, no pneumothorax on x-ray, free fluid flow and placement verified by x-ray   Procedure completion:  Tolerated well, no immediate complications Comments:         Final diagnoses:  Septic shock (HCC)  AKI (acute kidney injury) ([place])  Intra-abdominal infection  Acute cystitis with hematuria  Pneumonia of both lungs due to infectious organism, unspecified part of lung  Thrombocytopenia ([place])  Metabolic acidosis  Elevated liver enzymes      Noemi Chapel, MD 11/18/21 1536

## 2021-10-30 NOTE — ED Notes (Addendum)
Temp foley not accurate. Reading 90.1. rectal temp 101.2. edp notified. Bair hugger not applied.

## 2021-10-30 NOTE — H&P (Addendum)
NAME:  Ralph [name], MRN:  [id number], DOB:  27-Apr-1950, LOS: 0 ADMISSION DATE:  11/08/2021, CONSULTATION DATE:  10/20/2021 REFERRING MD:  Sabra Heck - [facility] EM, CHIEF COMPLAINT:  Shock   History of Present Illness:  Patient is encephalopathic and/or intubated. Therefore history has been obtained from chart review.   71 yo M PMH NASH cirrhosis, hx esophageal varices, dysphagia, Hepatocellular carcinoma undergoing tx, Afib-- on xarelto, who presented to [facility] ED 7/22 with respiratory distress. Pt was found unresponsive and agonal at home by family. On EMS arrival hypoxemic with sats 70s, Hypotensive SBP 80s. Covered in urine, hyperglycemic and in Afib RVR.  In ED, he required intubation as his mentation would not permit BiPAP. H received IVF and was started on pressors for ongoing shock.  Severe lactic acidosis, AKI on CKD, Hyperglycemia, Undifferentiated shock. Started on abx for possible sepsis.  PCCM consulted for admission to [facility] in this setting   Pertinent  Medical History  Afib Chronic anticoagulation NASH cirrhosis Esophageal varices HCC DM2  Orthostatic hypotension  Significant Hospital Events: Including procedures, antibiotic start and stop dates in addition to other pertinent events   7/22 presented to [facility], txr to [facility], PCCM consult  Interim History / Subjective:  See above  Unable to obtain subjective evaluation due to patient status   Objective   Blood pressure 101/61, pulse (!) 119, temperature (!) 101.2 F (38.4 C), temperature source Rectal, resp. rate (!) 32, height 5' 10" (1.778 m), weight 102.5 kg, SpO2 98 %.    Vent Mode: PRVC FiO2 (%):  [100 %] 100 % Set Rate:  [24 bmp-32 bmp] 32 bmp Vt Set:  [580 mL] 580 mL PEEP:  [5 cmH20] 5 cmH20 Plateau Pressure:  [21 cmH20-23 cmH20] 23 cmH20   Intake/Output Summary (Last 24 hours) at 10/09/2021 2233 Last data filed at 10/28/2021 2127 Gross per 24 hour  Intake --  Output 80 ml  Net -80 ml   Filed Weights    11/04/2021 1732  Weight: 102.5 kg    Examination: General: In bed, NAD, appears comfortable, chronically ill appearing HEENT: MM pink/moist, anicteric, atraumatic Neuro: sedated, PEERL 74m CV: S1S2, Afib, no m/r/g appreciated PULM:  air movement in all lobes, trachea midline, chest expansion symmetric GI: soft, bsx4 hypoactive, non-tender   Extremities: upper extremities warm, no pretibial edema, capillary refill less than 3 seconds  Skin:  no rashes or lesions noted   Labs, Imaging: Lactic acid 9.0 > 6.8 BHA 0.29 Troponin 331 > 290 Urine culture in process Urinalysis glucose greater than 500, positive for nitrates, protein over 300  INR 1.4 Blood cultures pending Magnesium 2.8 CK within normal limits Ammonia 58 BNP 740 VBG 7.28/35/69/16 0.2 Hemoglobin 19 Platelets 24 No leukocytosis K3.8  glucose 459 CO2 17, anion gap 24 Creatinine 2.85, BUN 60 (baseline 0.6-0.8) Albumin 3.0 Bilirubin 2.9 AST 53, alk phos 181 12 lead: Afib RVR CXR: No pneumo, no effusion, vascular congestion vs pneumonitis Head CT: no acute changes ABG 7.17/57/73/22.1 CT abdomen; 5.8 cm fluid and gas collection at the site of the patient's microwave ablation in the medial left hepatic lobe, compatible with tissue necrosis and post treatment abscess.  Resolved Hospital Problem list     Assessment & Plan:  Shock, undifferentiated, suspect sepsis Lactic acidosis, secondary to above -WBC is only 7.7. UA not c/w UT. CT abdomen with5.8 cm fluid and gas collection at the site of the patient's microwave ablation in the medial left hepatic lobe, compatible with tissue necrosis and  post treatment abscess- Possible cause. 2-2.5L of IVF given at OSH. P -empiric abx for possible septic shock. Continue vanc, switch from cefepime to zosyn to cover for anaerobes with concern of liver abscess  - bcx, trach aspirate if able to obtain  -cont IVF. LR at 125 -NE for MAP goal > 65. Titrate to goal -1 L lr bolus to  complete 30cc/kg and fluid resuscitate in setting of ?DKA -Obtain ECHO  -coox  -PCT would likely be of poor yield with other acute processes  -Dr. Lamonte Sakai spoke with IR. No clear indication for tube drainage at this time. IR will see patient in AM.  Acute metabolic encephalopathy -?etiology r/t hypoxemia, shock, infection, hyperglycemia, neuro process, HE, uremia. - Is on xarelto outpt, CT H negative  P -RASS goal 0 to -1 -supportive care  Acute respiratory failure with hypoxia and hypercapnia  P -LTVV strategy with tidal volumes of 4-8 cc/kg ideal body weight -Goal plateau pressures less than 30 and driving pressures less than 15 -Wean PEEP/FiO2 for SpO2 92-98% -VAP bundle -Daily SAT and SBT -PAD bundle with Propofol gtt and fentanyl push -RASS goal 0 to -1 -Follow intermittent CXR and ABG PRN  Afib RVR On xeralto at home. Takes flecainide and Cardizem.  P -Continue amio gtt -Goal K above 4, goal MG above 2  Hepatocellular carcinoma s/p TARE, microwave ablation  NASH cirrhosis  Elevated LFTs  Hyperbilirubinemia Hyperammonemia  Coagulopathy, mild -Tbili 2.9 Alk phos 181 AST 53.  ALT is WNL at <5, ammonia 58 P -trend LFTs, coags -repeat ammonia, consider lactulose  -Supportive care  AKI  -CK was ok  P -Ensure renal perfusion. Goal MAP 65 or greater. -Avoid neprotoxic drugs as possible. -Strict I&O's -Follow up AM creatinine -Foley -LR at 125  Thrombocytopenia  ? Polycythemia   -wonder if not wearing home O2  P -hold vte ppx with plt < 50  -Recheck  DM2 with hyperglycemia BHA 0.29, no ketones in urine -start endotool  -Recheck BMP now  Best Practice (right click and "Reselect all SmartList Selections" daily)   Diet/type: NPO w/ meds via tube DVT prophylaxis: SCD GI prophylaxis: PPI Lines: Central line and yes and it is still needed Foley:  Yes, and it is still needed Code Status:  full code Last date of multidisciplinary goals of care discussion  [Pending]  Labs   CBC: Recent Labs  Lab 10/11/2021 1614  WBC 7.7  NEUTROABS 7.0  HGB 19.3*  HCT 58.8*  MCV 99.7  PLT 24*    Basic Metabolic Panel: Recent Labs  Lab 10/19/2021 1614  NA 136  K 3.8  CL 95*  CO2 17*  GLUCOSE 459*  BUN 60*  CREATININE 2.85*  CALCIUM 8.7*  MG 2.8*   GFR: Estimated Creatinine Clearance: 29.8 mL/min (A) (by C-G formula based on SCr of 2.85 mg/dL (H)). Recent Labs  Lab 10/23/2021 1614 11/06/2021 1749  WBC 7.7  --   LATICACIDVEN >9.0* 6.8*    Liver Function Tests: Recent Labs  Lab 11/08/2021 1614  AST 53*  ALT <5  ALKPHOS 181*  BILITOT 2.9*  PROT 7.6  ALBUMIN 3.0*   No results for input(s): "LIPASE", "AMYLASE" in the last 168 hours. Recent Labs  Lab 10/24/2021 1614  AMMONIA 58*    ABG    Component Value Date/Time   PHART 7.17 (LL) 11/07/2021 1929   PCO2ART 57 (H) 10/10/2021 1929   PO2ART 73 (L) 11/03/2021 1929   HCO3 22.1 10/28/2021 1929   TCO2 22.0  03/02/2012 1040   ACIDBASEDEF 8.3 (H) 11/05/2021 1929   O2SAT 93.6 10/14/2021 1929     Coagulation Profile: Recent Labs  Lab 10/29/2021 1705  INR 1.4*    Cardiac Enzymes: Recent Labs  Lab 10/16/2021 1614  CKTOTAL 366    HbA1C: Hemoglobin A1C  Date/Time Value Ref Range Status  10/29/2020 09:43 AM 8.2 (A) 4.0 - 5.6 % Final  04/30/2020 07:54 AM 8.4 (A) 4.0 - 5.6 % Final   Hgb A1c MFr Bld  Date/Time Value Ref Range Status  11/21/2016 12:32 PM 7.1 (H) <5.7 % Final    Comment:      For someone without known diabetes, a hemoglobin A1c value of 6.5% or greater indicates that they may have diabetes and this should be confirmed with a follow-up test.   For someone with known diabetes, a value <7% indicates that their diabetes is well controlled and a value greater than or equal to 7% indicates suboptimal control. A1c targets should be individualized based on duration of diabetes, age, comorbid conditions, and other considerations.   Currently, no consensus exists for use  of hemoglobin A1c for diagnosis of diabetes for children.     08/11/2016 08:36 AM 6.6 (H) <5.7 % Final    Comment:      For someone without known diabetes, a hemoglobin A1c value of 6.5% or greater indicates that they may have diabetes and this should be confirmed with a follow-up test.   For someone with known diabetes, a value <7% indicates that their diabetes is well controlled and a value greater than or equal to 7% indicates suboptimal control. A1c targets should be individualized based on duration of diabetes, age, comorbid conditions, and other considerations.   Currently, no consensus exists for use of hemoglobin A1c for diagnosis of diabetes for children.       CBG: Recent Labs  Lab 11/05/2021 1612 11/02/2021 1943 10/13/2021 2100 10/27/2021 2226  GLUCAP 436* 370* 326* 243*    Review of Systems:   Unable to obtain ROS due to patient status  Past Medical History:  He,  has a past medical history of Anginal pain ([place]), Arthritis, Atrial flutter ([place]), Cancer ([place]), CHB (complete heart block) ([place]), CHF (congestive heart failure) ([place]) (01/06/2012), Chronic lower back pain, Cirrhosis (HCC), Coughing up blood, DDD (degenerative disc disease), lumbar, Depressed, Difficult intubation, Emphysema, Fatty liver disease, nonalcoholic, GERD (gastroesophageal reflux disease), H/O hiatal hernia, Headache, History of esophageal varices, Hypertension, Hypothyroidism, Liver cancer ([place]), Orthostatic dizziness, Pacemaker, Pneumonia (11/2014), Presence of permanent cardiac pacemaker (9/292013), Sinus pause (01/06/2012), Sleep apnea, Stroke ([place]), Type II diabetes mellitus ([place]), and Varicose vein.   Surgical History:   Past Surgical History:  Procedure Laterality Date   ATRIAL FLUTTER ABLATION N/A 07/10/2013   Procedure: ATRIAL FLUTTER ABLATION;  Surgeon: Evans Lance, MD;  Location: [facility] C[facility] LAB;  Service: Cardiovascular;  Laterality: N/A;   BACK SURGERY     CHOLECYSTECTOMY  1993    COLONOSCOPY  11/08/2004   [id number] rectum, colon, TI.   COLONOSCOPY N/A 05/28/2014   Dr. Gala Romney: Redundant colon. single colonic polyp removed as described above. Tubular adenoma   COLONOSCOPY WITH PROPOFOL N/A 09/02/2021   Procedure: COLONOSCOPY WITH PROPOFOL;  Surgeon: Daneil Dolin, MD;  [facility];  Service: Endoscopy;  Laterality: N/A;  9:15AM   ESOPHAGEAL DILATION N/A 05/28/2014   Procedure: ESOPHAGEAL DILATION;  Surgeon: Daneil Dolin, MD;  [facility];  Service: Endoscopy;  Laterality: N/A;   ESOPHAGOGASTRODUODENOSCOPY  11/08/2004  UMP:NTIRWE esophageal erosions consistent with erosive reflux esophagitis/Areas of hemorrhage and nodularity of the fundal mucosa of uncertain significance, biopsied.  Small hiatal hernia, otherwise normal stomach   ESOPHAGOGASTRODUODENOSCOPY  2010   Dr. Gala Romney: 3 columns Grade 1 varices, erosive esophagitis, HH, portal gastropathy, normal D1, D2   ESOPHAGOGASTRODUODENOSCOPY N/A 05/28/2014   Dr. Gala Romney: MIld erosive reflux esophagitis. Grade 1 esophageal varices. Patent esophagus. No dilation performed. Hiatal hernia.    ESOPHAGOGASTRODUODENOSCOPY (EGD) WITH ESOPHAGEAL DILATION N/A 02/14/2013   RXV:QMGQQ 1 esophageal varices. Abnormal distal esophagus/status post biopsy after Maloney dilation. Portal gastropathy. Antral erosions-status post biopsy. path negative for H.pylori, benign path.   ESOPHAGOGASTRODUODENOSCOPY (EGD) WITH PROPOFOL N/A 09/02/2021   Procedure: ESOPHAGOGASTRODUODENOSCOPY (EGD) WITH PROPOFOL;  Surgeon: Daneil Dolin, MD;  [facility];  Service: Endoscopy;  Laterality: N/A;   IR 3D INDEPENDENT WKST  05/24/2021   IR ANGIOGRAM SELECTIVE EACH ADDITIONAL VESSEL  04/30/2021   IR ANGIOGRAM SELECTIVE EACH ADDITIONAL VESSEL  04/30/2021   IR ANGIOGRAM SELECTIVE EACH ADDITIONAL VESSEL  04/30/2021   IR ANGIOGRAM SELECTIVE EACH ADDITIONAL VESSEL  04/30/2021   IR ANGIOGRAM SELECTIVE EACH ADDITIONAL VESSEL  04/30/2021   IR  ANGIOGRAM SELECTIVE EACH ADDITIONAL VESSEL  05/24/2021   IR ANGIOGRAM VISCERAL SELECTIVE  04/30/2021   IR ANGIOGRAM VISCERAL SELECTIVE  04/30/2021   IR EMBO ARTERIAL NOT HEMORR HEMANG INC GUIDE ROADMAPPING  04/30/2021   IR EMBO TUMOR ORGAN ISCHEMIA INFARCT INC GUIDE ROADMAPPING  05/24/2021   IR RADIOLOGIST EVAL & MGMT  03/03/2021   IR US GUIDE VASC ACCESS RIGHT  04/30/2021   IR US GUIDE VASC ACCESS RIGHT  05/24/2021   LUMBAR [place] SURGERY  1994; ~ 1995; ~ [place]   nuclear stress test  10/19/2004   No ischemia   PERMANENT PACEMAKER INSERTION  01/08/2012   CHB   PERMANENT PACEMAKER INSERTION N/A 01/09/2012   Procedure: PERMANENT PACEMAKER INSERTION;  Surgeon: Sanda Klein, MD;  Location: [place] CATH LAB;  Service: Cardiovascular;  Laterality: N/A;   POSTERIOR FUSION LUMBAR SPINE  1999   L4-5   SPINAL CORD STIMULATOR IMPLANT  2006   SPINAL CORD STIMULATOR REMOVAL N/A 01/27/2015   Procedure: LUMBAR SPINAL CORD STIMULATOR REMOVAL;  Surgeon: Kristeen Miss, MD;  Location: [place] NEURO ORS;  Service: Neurosurgery;  Laterality: N/A;  LUMBAR SPINAL CORD STIMULATOR REMOVAL   TONSILLECTOMY AND ADENOIDECTOMY  1992   US ECHOCARDIOGRAPHY  12/28/2011   mild LVH,mild mitral annulara ca+,mild MR   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 He started smoking about 53 years ago. He has a 22.50 pack-year smoking history. He has never used smokeless tobacco. He reports that he does not drink alcohol and does not use drugs.   Family History:  His family history includes Arrhythmia in his father; Cancer in his mother; Crohn's disease in his daughter; Diabetes in his maternal grandmother; Other in his father; Ovarian cancer in his mother; Stroke in his brother, brother, and sister. There is no history of Colon cancer.   Allergies Allergies  Allergen Reactions   Nitroglycerin Hives, Swelling and Rash     Home Medications  Prior  to Admission medications   Medication Sig Start Date End Date Taking? Authorizing Provider  ACCU-CHEK GUIDE test strip TEST TWICE A DAY BEFORE MEALS 01/13/20   Shamleffer, Melanie Crazier, MD  acetaminophen (TYLENOL) 500 MG tablet Take 500 mg by  mouth every 6 (six) hours as needed for moderate pain.    [provider]  Blood Glucose Monitoring Suppl (ACCU-CHEK AVIVA) device by Other route. Use as instructed to check blood sugar 2 times daily    [provider]  ciclopirox (PENLAC) 8 % solution APPLY TOPICALLY AT BEDTIME. APPLY OVER NAIL AND SURROUNDING SKIN. APPLY DAILY OVER PREVIOUS COAT. AFTER SEVEN (7) DAYS, MAY REMOVE WITH ALCOHOL AND CONTINUE CYCLE. 07/14/21   Lorenda Peck, DPM  Continuous Blood Gluc Receiver (DEXCOM G6 RECEIVER) DEVI Use as instruct to check blood sugar daily 05/18/20   Shamleffer, Melanie Crazier, MD  Continuous Blood Gluc Sensor (DEXCOM G6 SENSOR) MISC 1 Device by Does not apply route as directed. 04/30/20   Shamleffer, Melanie Crazier, MD  Continuous Blood Gluc Transmit (DEXCOM G6 TRANSMITTER) MISC 1 Device by Does not apply route as directed. 04/30/20   Shamleffer, Melanie Crazier, MD  dapagliflozin propanediol (FARXIGA) 10 MG TABS tablet Take 1 tablet (10 mg total) by mouth daily. 05/03/21   Shamleffer, Melanie Crazier, MD  diazepam (VALIUM) 10 MG tablet Take 1 tablet by mouth every 8 (eight) hours as needed (muscle spasms). 05/20/19   [provider]  diltiazem (CARDIZEM CD) 300 MG 24 hr capsule TAKE 1 CAPSULE BY MOUTH EVERY DAY 07/12/21   Arnoldo Lenis, MD  escitalopram (LEXAPRO) 10 MG tablet Take 10 mg by mouth daily. 04/19/19   [provider]  flecainide (TAMBOCOR) 100 MG tablet TAKE 1 TABLET BY MOUTH TWICE A DAY 05/21/21   Evans Lance, MD  furosemide (LASIX) 40 MG tablet Take 40 mg by mouth daily as needed for fluid or edema.    [provider]  Insulin Aspart FlexPen (NOVOLOG) 100 UNIT/ML Max daily 50 units Patient taking  differently: Inject 10-14 Units into the skin 3 (three) times daily before meals. Max daily 50 units 05/03/21   Shamleffer, Melanie Crazier, MD  Insulin Degludec FlexTouch 100 UNIT/ML SOPN Inject 32 Units into the skin daily. 05/03/21   Shamleffer, Melanie Crazier, MD  Insulin Pen Needle 31G X 5 MM MISC 1 Device by Does not apply route in the morning, at noon, in the evening, and at bedtime. 05/03/21   Shamleffer, Melanie Crazier, MD  levothyroxine (SYNTHROID, LEVOTHROID) 112 MCG tablet TAKE 1 TABLET BY MOUTH EVERY MORNING BEFORE BREAKFAST 04/24/17   Cassandria Anger, MD  metFORMIN (GLUCOPHAGE) 1000 MG tablet Take 1 tablet (1,000 mg total) by mouth 2 (two) times daily with a meal. 05/03/21   Shamleffer, Melanie Crazier, MD  Omega-3 Fatty Acids (FISH OIL) 1000 MG CAPS Take 1,000 mg by mouth in the morning and at bedtime.    [provider]  oxymetazoline (AFRIN) 0.05 % nasal spray Place 1 spray into both nostrils 2 (two) times daily as needed for congestion. Sinex brand    [provider]  pantoprazole (PROTONIX) 40 MG tablet Take 1 tablet (40 mg total) by mouth daily. 03/01/21   Erenest Rasher, PA-C  Polyethyl Glycol-Propyl Glycol (SYSTANE) 0.4-0.3 % SOLN Place 1 drop into both eyes every 6 (six) hours as needed (dry eyes).    [provider]  polyethylene glycol-electrolytes (TRILYTE) 420 g solution Take 4,000 mLs by mouth as directed. 08/24/21   Daneil Dolin, MD  propranolol (INDERAL) 20 MG tablet TAKE 1 TABLET BY MOUTH TWICE A DAY 02/23/21   Mahala Menghini, PA-C  rivaroxaban (XARELTO) 20 MG TABS tablet TAKE 1 TABLET BY MOUTH EVERY DAY WITH DINNER 05/09/18  Evans Lance, MD  simvastatin (ZOCOR) 20 MG tablet TAKE 1 TABLET BY MOUTH EVERY DAY IN THE EVENING 09/16/21   Arnoldo Lenis, MD  spironolactone (ALDACTONE) 50 MG tablet TAKE 1 TABLET BY MOUTH TWICE A DAY 02/23/21   Mahala Menghini, PA-C     Critical care time: 40 minutes     [employer].,  MSN, APRN, AGACNP-BC [facility]  10/17/2021 , 10:33 PM  Please see [email or web address] for pager details  If no response, please call [phone] After hours, please call Elink at [phone]

## 2021-10-30 NOTE — ED Provider Notes (Signed)
[facility] EMERGENCY DEPARTMENT Provider Note   CSN: [id number] Arrival date & time: 10/20/2021  1608     History  Chief Complaint  Patient presents with   Respiratory Distress    Ralph [name] is a 71 y.o. male with a past medical history significant for CHF, atrial flutter with anticoagulation on Xarelto, history of current pleat heart block status post pacemaker, insulin-dependent type 2 diabetes mellitus, orthostatic hypotension, history of liver cancer, cirrhosis, esophageal varices, history of stroke who presents via EMS for respiratory distress and altered mental status.  EMS reports that it has been greater than 12 hours since anyone has seen the patient.  His wife found him draped over the bed, unresponsive to voice or stimulus and breathing rapidly with his legs hanging off the edge of the bed for an unknown amount of time.  EMS reports that the patient appears to be in SVT but they did not give adenosine prior to arrival.  Review of medication shows that the patient is on flecainide, Xarelto and diltiazem unsure of last medication dosages.  Patient also has a blood sugar of 409 prior to arrival.  Patient unable to provide any history.  HPI     Home Medications Prior to Admission medications   Medication Sig Start Date End Date Taking? Authorizing Provider  ACCU-CHEK GUIDE test strip TEST TWICE A DAY BEFORE MEALS 01/13/20   Shamleffer, Melanie Crazier, MD  acetaminophen (TYLENOL) 500 MG tablet Take 500 mg by mouth every 6 (six) hours as needed for moderate pain.    [provider]  Blood Glucose Monitoring Suppl (ACCU-CHEK AVIVA) device by Other route. Use as instructed to check blood sugar 2 times daily    [provider]  ciclopirox (PENLAC) 8 % solution APPLY TOPICALLY AT BEDTIME. APPLY OVER NAIL AND SURROUNDING SKIN. APPLY DAILY OVER PREVIOUS COAT. AFTER SEVEN (7) DAYS, MAY REMOVE WITH ALCOHOL AND CONTINUE CYCLE. 07/14/21   Lorenda Peck, DPM  Continuous  Blood Gluc Receiver (DEXCOM G6 RECEIVER) DEVI Use as instruct to check blood sugar daily 05/18/20   Shamleffer, Melanie Crazier, MD  Continuous Blood Gluc Sensor (DEXCOM G6 SENSOR) MISC 1 Device by Does not apply route as directed. 04/30/20   Shamleffer, Melanie Crazier, MD  Continuous Blood Gluc Transmit (DEXCOM G6 TRANSMITTER) MISC 1 Device by Does not apply route as directed. 04/30/20   Shamleffer, Melanie Crazier, MD  dapagliflozin propanediol (FARXIGA) 10 MG TABS tablet Take 1 tablet (10 mg total) by mouth daily. 05/03/21   Shamleffer, Melanie Crazier, MD  diazepam (VALIUM) 10 MG tablet Take 1 tablet by mouth every 8 (eight) hours as needed (muscle spasms). 05/20/19   [provider]  diltiazem (CARDIZEM CD) 300 MG 24 hr capsule TAKE 1 CAPSULE BY MOUTH EVERY DAY 07/12/21   Arnoldo Lenis, MD  escitalopram (LEXAPRO) 10 MG tablet Take 10 mg by mouth daily. 04/19/19   [provider]  flecainide (TAMBOCOR) 100 MG tablet TAKE 1 TABLET BY MOUTH TWICE A DAY 05/21/21   Evans Lance, MD  furosemide (LASIX) 40 MG tablet Take 40 mg by mouth daily as needed for fluid or edema.    [provider]  Insulin Aspart FlexPen (NOVOLOG) 100 UNIT/ML Max daily 50 units Patient taking differently: Inject 10-14 Units into the skin 3 (three) times daily before meals. Max daily 50 units 05/03/21   Shamleffer, Melanie Crazier, MD  Insulin Degludec FlexTouch 100 UNIT/ML SOPN Inject 32 Units into the skin daily. 05/03/21   Shamleffer,  Melanie Crazier, MD  Insulin Pen Needle 31G X 5 MM MISC 1 Device by Does not apply route in the morning, at noon, in the evening, and at bedtime. 05/03/21   Shamleffer, Melanie Crazier, MD  levothyroxine (SYNTHROID, LEVOTHROID) 112 MCG tablet TAKE 1 TABLET BY MOUTH EVERY MORNING BEFORE BREAKFAST 04/24/17   Cassandria Anger, MD  metFORMIN (GLUCOPHAGE) 1000 MG tablet Take 1 tablet (1,000 mg total) by mouth 2 (two) times daily with a meal. 05/03/21   Shamleffer, Melanie Crazier, MD  Omega-3 Fatty Acids (FISH OIL) 1000 MG CAPS Take 1,000 mg by mouth in the morning and at bedtime.    [provider]  oxymetazoline (AFRIN) 0.05 % nasal spray Place 1 spray into both nostrils 2 (two) times daily as needed for congestion. Sinex brand    [provider]  pantoprazole (PROTONIX) 40 MG tablet Take 1 tablet (40 mg total) by mouth daily. 03/01/21   Erenest Rasher, PA-C  Polyethyl Glycol-Propyl Glycol (SYSTANE) 0.4-0.3 % SOLN Place 1 drop into both eyes every 6 (six) hours as needed (dry eyes).    [provider]  polyethylene glycol-electrolytes (TRILYTE) 420 g solution Take 4,000 mLs by mouth as directed. 08/24/21   Rourk, [name], MD  propranolol (INDERAL) 20 MG tablet TAKE 1 TABLET BY MOUTH TWICE A DAY 02/23/21   Mahala Menghini, PA-C  rivaroxaban (XARELTO) 20 MG TABS tablet TAKE 1 TABLET BY MOUTH EVERY DAY WITH DINNER 05/09/18   Evans Lance, MD  simvastatin (ZOCOR) 20 MG tablet TAKE 1 TABLET BY MOUTH EVERY DAY IN THE EVENING 09/16/21   Arnoldo Lenis, MD  spironolactone (ALDACTONE) 50 MG tablet TAKE 1 TABLET BY MOUTH TWICE A DAY 02/23/21   Mahala Menghini, PA-C      Allergies    Nitroglycerin    Review of Systems   Review of Systems  Physical Exam Updated Vital Signs BP 101/61   Pulse (!) 119   Temp (!) 101.2 F (38.4 C) (Rectal)   Resp (!) 32   Ht '5\' 10"'$  (1.778 m)   Wt 97.4 kg   SpO2 97%   BMI 30.81 kg/m  Physical Exam Vitals reviewed.  Constitutional:      Appearance: He is ill-appearing and toxic-appearing.  HENT:     Head: Normocephalic.     Comments: Cyanotic appearing features with purple nose, blue lips    Mouth/Throat:     Mouth: Mucous membranes are dry.  Eyes:     Pupils: Pupils are equal, round, and reactive to light.     Comments: Rapid bilateral nystagmus noted in the eyes  Cardiovascular:     Rate and Rhythm: Tachycardia present. Rhythm irregular.     Comments: Distal extremities are cool, he has  mottling in the lower extremities.  He is hot to the touch near his bottom which he also has stage I pressure injury. Pulmonary:     Comments: Patient tachypneic into the mid 40s breathing deeply and rapidly Abdominal:     General: There is no distension.     Comments: Dexcom in place in the right lower quadrant of the abdomen, multiple bruises on the abdomen suggestive of injection sites  Musculoskeletal:     Cervical back: Neck supple.     Comments: Patient moving extremities with purpose no obvious deformities  Neurological:     GCS: GCS eye subscore is 4. GCS verbal subscore is 2. GCS motor subscore is 6.     Comments:  Patient is confused, he does not answer purposefully to questions but does follow some directions such as raising up his hands and holding them when prompted repeatedly.      ED Results / Procedures / Treatments   Labs (all labs ordered are listed, but only abnormal results are displayed) Labs Reviewed  LACTIC ACID, PLASMA - Abnormal; Notable for the following components:      Result Value   Lactic Acid, Venous >9.0 (*)    All other components within normal limits  LACTIC ACID, PLASMA - Abnormal; Notable for the following components:   Lactic Acid, Venous 6.8 (*)    All other components within normal limits  COMPREHENSIVE METABOLIC PANEL - Abnormal; Notable for the following components:   Chloride 95 (*)    CO2 17 (*)    Glucose, Bld 459 (*)    BUN 60 (*)    Creatinine, Ser 2.85 (*)    Calcium 8.7 (*)    Albumin 3.0 (*)    AST 53 (*)    Alkaline Phosphatase 181 (*)    Total Bilirubin 2.9 (*)    GFR, Estimated 23 (*)    Anion gap 24 (*)    All other components within normal limits  CBC WITH DIFFERENTIAL/PLATELET - Abnormal; Notable for the following components:   RBC 5.90 (*)    Hemoglobin 19.3 (*)    HCT 58.8 (*)    Platelets 24 (*)    Lymphs Abs 0.2 (*)    All other components within normal limits  URINALYSIS, ROUTINE W REFLEX MICROSCOPIC -  Abnormal; Notable for the following components:   Color, Urine AMBER (*)    APPearance HAZY (*)    Glucose, UA >=500 (*)    Hgb urine dipstick MODERATE (*)    Protein, ur >=300 (*)    Nitrite POSITIVE (*)    Bacteria, UA RARE (*)    All other components within normal limits  BLOOD GAS, VENOUS - Abnormal; Notable for the following components:   pCO2, Ven 35 (*)    pO2, Ven 69 (*)    Bicarbonate 16.2 (*)    Acid-base deficit 9.2 (*)    All other components within normal limits  BRAIN NATRIURETIC PEPTIDE - Abnormal; Notable for the following components:   B Natriuretic Peptide 740.0 (*)    All other components within normal limits  AMMONIA - Abnormal; Notable for the following components:   Ammonia 58 (*)    All other components within normal limits  MAGNESIUM - Abnormal; Notable for the following components:   Magnesium 2.8 (*)    All other components within normal limits  PROTIME-INR - Abnormal; Notable for the following components:   Prothrombin Time 16.5 (*)    INR 1.4 (*)    All other components within normal limits  BETA-HYDROXYBUTYRIC ACID - Abnormal; Notable for the following components:   Beta-Hydroxybutyric Acid 0.29 (*)    All other components within normal limits  BLOOD GAS, ARTERIAL - Abnormal; Notable for the following components:   pH, Arterial 7.17 (*)    pCO2 arterial 57 (*)    pO2, Arterial 73 (*)    Acid-base deficit 8.3 (*)    All other components within normal limits  GLUCOSE, CAPILLARY - Abnormal; Notable for the following components:   Glucose-Capillary 243 (*)    All other components within normal limits  CBG MONITORING, ED - Abnormal; Notable for the following components:   Glucose-Capillary 436 (*)    All other components within  normal limits  CBG MONITORING, ED - Abnormal; Notable for the following components:   Glucose-Capillary 370 (*)    All other components within normal limits  CBG MONITORING, ED - Abnormal; Notable for the following  components:   Glucose-Capillary 326 (*)    All other components within normal limits  TROPONIN I (HIGH SENSITIVITY) - Abnormal; Notable for the following components:   Troponin I (High Sensitivity) 331 (*)    All other components within normal limits  TROPONIN I (HIGH SENSITIVITY) - Abnormal; Notable for the following components:   Troponin I (High Sensitivity) 290 (*)    All other components within normal limits  CULTURE, BLOOD (ROUTINE X 2)  CULTURE, BLOOD (ROUTINE X 2)  RESP PANEL BY RT-PCR (FLU A&B, COVID) ARPGX2  URINE CULTURE  MRSA NEXT GEN BY PCR, NASAL  CK  TSH  APTT  BLOOD GAS, ARTERIAL  BETA-HYDROXYBUTYRIC ACID  BLOOD GAS, ARTERIAL  LACTIC ACID, PLASMA  CBC  BASIC METABOLIC PANEL  COOXEMETRY PANEL  TRIGLYCERIDES  CBC  BASIC METABOLIC PANEL  MAGNESIUM  AMMONIA    EKG EKG Interpretation  Date/Time:  Saturday October 30 2021 16:15:16 EDT Ventricular Rate:  182 PR Interval:    QRS Duration: 90 QT Interval:  272 QTC Calculation: 474 R Axis:   260 Text Interpretation: Atrial fibrillation with rapid V-rate Right superior axis Low voltage, extremity leads Anteroseptal infarct, old Since last tracing paced rhythm not seen, now afib with rvr Confirmed by Noemi Chapel [phone]) on 10/12/2021 4:24:56 PM  Radiology Portable Chest x-ray  Result Date: 10/21/2021 CLINICAL DATA:  [id number].  Respiratory distress. EXAM: PORTABLE CHEST 1 VIEW COMPARISON:  Chest x-ray 11/02/2021 7:27 p.m., CT chest 04/06/2021, CT abdomen pelvis 10/21/2021 FINDINGS: Left chest wall 2 lead pacemaker in similar position. Endotracheal tube with tip 2.5 cm above the carina. Endotracheal tube with tip and side port coursing below the hemidiaphragm overlying the expected region of the gastric lumen. Right internal jugular central venous catheter with tip overlying superior cavoatrial junction. The heart and mediastinal contours are unchanged. Aortic calcification. Query developing bilateral patchy airspace  opacities. Chronic coarsened markings. Bilateral trace pleural effusions. No pneumothorax. No acute osseous abnormality. IMPRESSION: 1. Query developing bilateral patchy airspace opacities. Followup PA and lateral chest X-ray is recommended in 3-4 weeks following therapy to ensure resolution and exclude underlying malignancy. 2. Bilateral trace pleural effusions. 3. Aortic Atherosclerosis (ICD10-I70.0) and Emphysema (ICD10-J43.9). Electronically Signed   By: Iven Finn [name]   On: 11/02/2021 22:59   CT ABDOMEN PELVIS WO CONTRAST  Result Date: 10/13/2021 CLINICAL DATA:  Nausea/vomiting, unresponsive. Per EPIC, the patient is status post laparoscopic microwave ablation of the liver on May 10th. EXAM: CT ABDOMEN AND PELVIS WITHOUT CONTRAST TECHNIQUE: Multidetector CT imaging of the abdomen and pelvis was performed following the standard protocol without IV contrast. RADIATION DOSE REDUCTION: This exam was performed according to the departmental dose-optimization program which includes automated exposure control, adjustment of the mA and/or kV according to patient size and/or use of iterative reconstruction technique. COMPARISON:  CT abdomen dated 07/30/2020. FINDINGS: Lower chest: Small bilateral pleural effusions. Mild patchy right middle lobe and bilateral lower lobe opacities, favoring atelectasis. Hepatobiliary: Nodular hepatic contour, suggesting cirrhosis. 5.3 x 5.2 x 5.8 cm fluid and gas collection at the site of the patient's microwave ablation in the medial left hepatic lobe (series 2/image 29). There is associated gas extending beyond the liver capsule (series 2/image 28). This reflects tissue necrosis with post treatment abscess. Two 12 mm  metallic clips/markers are present within the procedural bed, presumably related to biopsy, although correlation with the procedure is suggested. Status post cholecystectomy. No intrahepatic or extrahepatic duct dilatation. Pancreas: Within normal limits. Spleen:  Within normal limits. Adrenals/Urinary Tract: Stable low-density nodularity of the bilateral adrenal glands, suggesting benign adrenal adenomas. Kidneys are within normal limits. No renal calculi or hydronephrosis. Bladder is decompressed by an indwelling Foley catheter. Stomach/Bowel: Enteric tube terminates in the proximal gastric body. No evidence of bowel obstruction. Normal appendix (series 2/image 81). No colonic wall thickening or inflammatory changes. Vascular/Lymphatic: No evidence of abdominal aortic aneurysm. Atherosclerotic calcifications of the abdominal aorta and branch vessels. Small upper abdominal lymph nodes, likely reactive. Reproductive: Prostate is unremarkable. Other: No abdominopelvic ascites. Tiny fat containing left inguinal hernia (series 2/image 89). Musculoskeletal: Status post PLIF at L1-2. Degenerative changes of the lower thoracic spine. IMPRESSION: 5.8 cm fluid and gas collection at the site of the patient's microwave ablation in the medial left hepatic lobe, compatible with tissue necrosis and post treatment abscess. Small bilateral pleural effusions. Mild patchy right middle lobe and bilateral lower lobe opacities, favoring atelectasis. Additional ancillary findings as above. Electronically Signed   By: Julian Hy [name]   On: 11/05/2021 20:55   CT Head Wo Contrast  Result Date: 10/11/2021 CLINICAL DATA:  Altered mental status EXAM: CT HEAD WITHOUT CONTRAST TECHNIQUE: Contiguous axial images were obtained from the base of the skull through the vertex without intravenous contrast. RADIATION DOSE REDUCTION: This exam was performed according to the departmental dose-optimization program which includes automated exposure control, adjustment of the mA and/or kV according to patient size and/or use of iterative reconstruction technique. COMPARISON:  None Available. FINDINGS: Brain: No evidence of acute infarction, hemorrhage, hydrocephalus, extra-axial collection or mass  lesion/mass effect. Vascular: No hyperdense vessel or unexpected calcification. Skull: Normal. Negative for fracture or focal lesion. Sinuses/Orbits: No acute finding. Other: None. IMPRESSION: Normal head CT. Electronically Signed   By: Julian Hy [name]   On: 10/28/2021 20:32   DG Chest 1V REPEAT Same Day  Result Date: 11/02/2021 CLINICAL DATA:  Central line placement verification.  [id number] EXAM: CHEST - 1 VIEW SAME DAY COMPARISON:  Cxr 11/07/2021 FINDINGS: Endotracheal tube with tip terminating 3 cm above the carina. Enteric tube coursing below the hemidiaphragm with side port overlying the expected region of the gastric lumen and tip collimated off view. Right internal jugular central venous catheter with tip overlying the expected region just distal to the superior cavoatrial junction. The heart and mediastinal contours are unchanged. Left chest wall 2 lead cardiac pacemaker in similar position. Cardiac paddles overlie the chest. Right base airspace opacity. Coarsened markings with no overt pulmonary edema. No pleural effusion. No pneumothorax. No acute osseous abnormality. IMPRESSION: 1. Right internal jugular central venous catheter with tip overlying the expected region just distal to the superior cavoatrial junction. 2. Right base airspace opacity. Finding could represent a combination of atelectasis versus infection/inflammation. 3.  Emphysema (ICD10-J43.9). Electronically Signed   By: Iven Finn [name]   On: 10/26/2021 19:27   DG Chest Port 1V same Day  Result Date: 10/15/2021 CLINICAL DATA:  ET and orogastric tube placement. EXAM: PORTABLE CHEST 1 VIEW COMPARISON:  11/06/2021 at 4:33 p.m. FINDINGS: Endotracheal tube tip projects 3.8 cm above the carina. Orogastric tube passes below the diaphragm into the mid stomach. Bilateral coarse interstitial opacities are without change. IMPRESSION: 1. Well-positioned endotracheal tube and nasal/orogastric tube. Electronically Signed   By: Dedra Skeens.D.  On: 10/17/2021 18:04   DG Chest Port 1 View  Result Date: 10/18/2021 CLINICAL DATA:  Respiratory distress.  Possible sepsis. EXAM: PORTABLE CHEST 1 VIEW COMPARISON:  12/02/2017 FINDINGS: Left-sided pacemaker unchanged. Lungs are somewhat hypoinflated. No pleural effusion or pneumothorax. Mild diffuse bilateral interstitial prominence which could be due to vascular congestion versus interstitial pneumonitis. Stable chronic changes over the left upper lobe/apex. Cardiomediastinal silhouette and remainder of the exam is unchanged. IMPRESSION: Mild diffuse bilateral interstitial prominence which could be due to vascular congestion versus interstitial pneumonitis. Electronically Signed   By: Marin Olp [name]   On: 10/12/2021 16:41    Procedures Procedure Name: Intubation Date/Time: 10/13/2021 11:25 PM  Performed by: Margarita Mail, PA-CPre-anesthesia Checklist: Patient identified Oxygen Delivery Method: Non-rebreather mask Preoxygenation: Pre-oxygenation with 100% oxygen Induction Type: IV induction and Rapid sequence Ventilation: Mask ventilation without difficulty Laryngoscope Size: Glidescope and 4 Grade View: Grade I Endobronchial tube: 28 Fr Tube size: 7.5 mm Number of attempts: 1 Airway Equipment and Method: Video-laryngoscopy Placement Confirmation: Positive ETCO2, ETT inserted through vocal cords under direct vision, CO2 detector and Breath sounds checked- equal and bilateral Secured at: 25 cm Tube secured with: ETT holder Dental Injury: Teeth and Oropharynx as per pre-operative assessment         Medications Ordered in ED Medications  lactated ringers infusion ( Intravenous New Bag/Given 10/22/2021 2101)  rocuronium bromide 100 MG/10ML SOSY (  Canceled Entry 11/02/2021 1931)  succinylcholine (ANECTINE) 200 MG/10ML syringe (  Not Given 10/23/2021 1825)  vancomycin (VANCOREADY) IVPB 1250 mg/250 mL (has no administration in time range)  norepinephrine (LEVOPHED) '4mg'$  in 25m  (0.016 mg/mL) premix infusion (6 mcg/min Intravenous Rate/Dose Change 10/26/2021 2103)  insulin regular, human (MYXREDLIN) 100 units/ 100 mL infusion (8.5 Units/hr Intravenous Rate/Dose Change 10/12/2021 2101)  dextrose 5 % in lactated ringers infusion (0 mLs Intravenous Hold 11/02/2021 1929)  dextrose 50 % solution 0-50 mL (has no administration in time range)  Oral care mouth rinse (has no administration in time range)  Oral care mouth rinse (has no administration in time range)  Chlorhexidine Gluconate Cloth 2 % PADS 6 each (6 each Topical Given 10/28/2021 2317)  sodium chloride flush (NS) 0.9 % injection 10-40 mL (10 mLs Intracatheter Given 10/14/2021 2319)  sodium chloride flush (NS) 0.9 % injection 10-40 mL (has no administration in time range)  pantoprazole sodium (PROTONIX) 40 mg/20 mL oral suspension 40 mg (has no administration in time range)  docusate (COLACE) 50 MG/5ML liquid 100 mg (has no administration in time range)  polyethylene glycol (MIRALAX / GLYCOLAX) packet 17 g (has no administration in time range)  pantoprazole sodium (PROTONIX) 40 mg/20 mL oral suspension 40 mg (has no administration in time range)  fentaNYL (SUBLIMAZE) injection 25 mcg (has no administration in time range)  fentaNYL (SUBLIMAZE) injection 25-100 mcg (has no administration in time range)  propofol (DIPRIVAN) 1000 MG/100ML infusion (20 mcg/kg/min  102.5 kg Intravenous New Bag/Given 10/16/2021 2315)  lactated ringers bolus 1,000 mL (has no administration in time range)  piperacillin-tazobactam (ZOSYN) IVPB 3.375 g (has no administration in time range)  lactated ringers bolus 500 mL (0 mLs Intravenous Stopped 10/18/2021 2001)  ceFEPIme (MAXIPIME) 2 g in sodium chloride 0.9 % 100 mL IVPB (0 g Intravenous Stopped 10/23/2021 1733)  metroNIDAZOLE (FLAGYL) IVPB 500 mg (0 mg Intravenous Stopped 10/14/2021 1827)  sodium chloride 0.9 % bolus 1,000 mL (1,000 mLs Intravenous Bolus 10/25/2021 1630)  sodium chloride 0.9 % bolus 1,000 mL (1,000  mLs Intravenous Bolus  10/29/2021 1701)  acetaminophen (TYLENOL) suppository 650 mg (650 mg Rectal Given 10/29/2021 1735)  etomidate (AMIDATE) injection ( Intravenous Canceled Entry 10/15/2021 1933)  rocuronium (ZEMURON) injection (100 mg Intravenous Given 10/21/2021 1720)  vancomycin (VANCOREADY) IVPB 2000 mg/400 mL (0 mg Intravenous Stopped 10/23/2021 2155)  lactated ringers bolus 2,050 mL (2,050 mLs Intravenous Bolus 10/26/2021 1857)  potassium chloride 10 mEq in 100 mL IVPB (0 mEq Intravenous Stopped 10/28/2021 2114)  sodium bicarbonate injection 100 mEq (100 mEq Intravenous Given 10/25/2021 2102)    ED Course/ Medical Decision Making/ A&P Clinical Course as of 11/02/2021 2324  Sat Oct 30, 2021  1634 This is a 71 year old male who arrives critically ill.  Patient has core temperature noted to be 101.1.  I reviewed the patient's EKG at bedside which appears to be a flutter or fib with rapid ventricular rate between 170 and 190.  Patient's initial oxygen saturation prior to arrival noted to be 77 on room air.  He is currently on nonrebreather at 15 L. The differential diagnosis is broad for this patient.  Certainly sepsis is a large concern and may be driving patient's rapid ventricular response.  Patient also with noted mice nystagmus and hypoxia.  I have concern he could have intracranial hemorrhage and potentially could have vomited and aspirated.  DKA is also a concern.  Patient also has a history of liver cancer.  He has a history of esophageal varices and may have any number of bleeding concerns.  Patient could potentially also have a pulmonary embolus, brain tumors.  Slew of tests are currently pending including VBG ammonia CK thyroid levels mag levels in standard sepsis work-up.  Patient receiving fluids, IV antibiotics.  Patient is critically ill and may require multiple interventions including intubation or venous line. [AH]  1641 I personally reviewed patient's chest x-ray.  Radiologic report is pending.  It does  not appear to be an acute infiltrate.  Lung findings are abnormal.  Does appear to have some baseline lung disease. [AH]  1641 EKG 12-Lead [AH]  1642 I visualized and interpreted EKGs.  Patient is in A-fib with a rapid ventricular rate noted to be 182. [AH]  1728 Patient successfully intubated [AH]  1906 Lactic Acid, Venous(!!): 6.8 [AH]  1932 Platelets(!!): 24 [AH]  2115 pH, Arterial(!!): 7.17 Sepsis - Repeat Assessment   Vitals     '@VITALSNOTREFRESHABLE'$ @  Heart:     Irregular rate and rhythm and Tachycardic  Lungs:    Rhonchi  Capillary Refill:   > 2 sec  Peripheral Pulse:   Radial pulse palpable  Skin:     Cyanotic and Mottled After sepsis recheck patient has mottled, cool extremities which appear to be more cyanotic.  Patient is on Levophed and current map is 43.  He has worsening lactic acidosis despite improving lactic acid levels.  I have asked that the nurse turn his Levophed down to goal map of about 65-67 and have ordered an amp of bicarb.  Patient is stable for transport and I reevaluated him just prior to department by [facility]    [AH]    Clinical Course User Index [AH] Margarita Mail, PA-C                           Medical Decision Making This patient presents to the ED for concern of altered mental status and respiratory distress, this involves an extensive number of treatment options, and is a complaint that carries with it a  high risk of complications and morbidity.  The differential diagnosis includes The differential diagnosis for AMS is extensive and includes, but is not limited to: drug overdose - opioids, alcohol, sedatives, antipsychotics, drug withdrawal, others; Metabolic: hypoxia, hypoglycemia, hyperglycemia, hypercalcemia, hypernatremia, hyponatremia, uremia, hepatic encephalopathy, hypothyroidism, hyperthyroidism, Lactic acidosis, DKA/HHOS; Infectious: meningitis, encephalitis, bacteremia/sepsis, urinary tract infection, pneumonia,  Structural:  Space-occupying lesion, (brain tumor, subdural hematoma, hydrocephalus,); Vascular: stroke, subarachnoid hemorrhage, coronary ischemia, hypertensive encephalopathy, CNS vasculitis, thrombotic thrombocytopenic purpura, disseminated intravascular coagulation,     Co morbidities that complicate the patient evaluation       Liver cancer, insulin-dependent diabetes, anticoagulation, atrial fibrillation   Additional history obtained:  Additional history obtained from patient's wife by phone, son and nephew at bedside    Lab Tests:  I Ordered, and personally interpreted labs.  The pertinent results include:    Initial blood gas showed compensated acidosis however this decompensated even with fluid resuscitation.  The patient also had lactic acidosis greater than 9, hypoxic respiratory failure, white blood cell count within normal limits however patient has significant thrombocytopenia.  Patient's liver enzymes elevated, AKI present.  Imaging Studies ordered:  I ordered imaging studies including chest x-ray which showed may be some infiltrates, CT head without abnormality, CT abdomen and pelvis reviewed at the end of the patient's ED course just prior to discharge shows some fluid and air levels in the site of patient's previous liver biopsy concerning for infection  I agree with the radiologist interpretation   Cardiac Monitoring:       The patient was maintained on a cardiac monitor.  I personally viewed and interpreted the cardiac monitored which showed an underlying rhythm of: A-fib with RVR   Medicines ordered and prescription drug management:  I ordered medication including fluids broad-spectrum antibiotics for sepsis Reevaluation of the patient after these medicines showed that the patient patient has improvement in some ways however appears not significantly improved as his acidosis is worsening and he has worsening mottling and signs of poor circulation in the lower extremities.   I asked the nurse to turn his Levophed drip down to a goal map of between 65 and 67 to make sure he is not being clamped down from Levophed and patient given an amp of bicarb just prior to transport I have reviewed the patients home medicines and have made adjustments as needed   Te   Critical Interventions:       Patient intubated, central line placed in the right IJ, massive fluid resuscitation, insulin drip, bicarb, Levophed   Consultations Obtained:  I requested consultation with the critical care team,  and discussed lab and imaging findings as well as pertinent plan - they recommend: ICU admission   Problem List / ED Course:       Septic shock with multiorgan system failure, UTI, pneumonia, intra-abdominal infection, thrombocytopenia, AKI,    Reevaluation:  After the interventions noted above, I reevaluated the patient and found that they have :stayed the same   Social Determinants of Health:       Patient lives at home with his wife who is not ambulatory   Dispostion:  After consideration of the diagnostic results and the patients response to treatment, I feel that the patent would benefit from admission.  Patient is currently full code as per discussion with the patient's wife  Amount and/or Complexity of Data Reviewed Labs: ordered. Decision-making details documented in ED Course. Radiology: ordered. ECG/medicine tests: ordered. Decision-making details documented in ED Course.  Risk OTC drugs. Prescription drug management. Decision regarding hospitalization.   Final Clinical Impression(s) / ED Diagnoses Final diagnoses:  Septic shock ([place])  AKI (acute kidney injury) ([place])  Intra-abdominal infection  Acute cystitis with hematuria  Pneumonia of both lungs due to infectious organism, unspecified part of lung  Thrombocytopenia ([place])  Metabolic acidosis  Elevated liver enzymes    Rx / DC Orders ED Discharge Orders     None         Margarita Mail, PA-C 11/08/2021 2327    Noemi Chapel, MD 2021/11/28 1535

## 2021-10-31 ENCOUNTER — Inpatient Hospital Stay (HOSPITAL_COMMUNITY): Payer: Medicare HMO

## 2021-10-31 DIAGNOSIS — R6521 Severe sepsis with septic shock: Secondary | ICD-10-CM

## 2021-10-31 DIAGNOSIS — K750 Abscess of liver: Secondary | ICD-10-CM | POA: Diagnosis present

## 2021-10-31 DIAGNOSIS — A4101 Sepsis due to Methicillin susceptible Staphylococcus aureus: Secondary | ICD-10-CM | POA: Diagnosis not present

## 2021-10-31 DIAGNOSIS — K7460 Unspecified cirrhosis of liver: Secondary | ICD-10-CM | POA: Diagnosis present

## 2021-10-31 DIAGNOSIS — C220 Liver cell carcinoma: Secondary | ICD-10-CM | POA: Diagnosis present

## 2021-10-31 DIAGNOSIS — Z66 Do not resuscitate: Secondary | ICD-10-CM | POA: Diagnosis not present

## 2021-10-31 DIAGNOSIS — K922 Gastrointestinal hemorrhage, unspecified: Secondary | ICD-10-CM | POA: Diagnosis present

## 2021-10-31 DIAGNOSIS — R7881 Bacteremia: Secondary | ICD-10-CM | POA: Diagnosis not present

## 2021-10-31 DIAGNOSIS — E872 Acidosis, unspecified: Secondary | ICD-10-CM | POA: Diagnosis present

## 2021-10-31 DIAGNOSIS — N171 Acute kidney failure with acute cortical necrosis: Secondary | ICD-10-CM | POA: Diagnosis not present

## 2021-10-31 LAB — BLOOD CULTURE ID PANEL (REFLEXED) - BCID2

## 2021-10-31 LAB — BASIC METABOLIC PANEL
Anion gap: 13 (ref 5–15)
BUN: 66 mg/dL — ABNORMAL HIGH (ref 8–23)
CO2: 22 mmol/L (ref 22–32)
Calcium: 7.5 mg/dL — ABNORMAL LOW (ref 8.9–10.3)
Chloride: 104 mmol/L (ref 98–111)
Creatinine, Ser: 3.58 mg/dL — ABNORMAL HIGH (ref 0.61–1.24)
GFR, Estimated: 18 mL/min — ABNORMAL LOW (ref >60)
Glucose, Bld: 163 mg/dL — ABNORMAL HIGH (ref 70–99)
Potassium: 3.7 mmol/L (ref 3.5–5.1)
Sodium: 139 mmol/L (ref 135–145)

## 2021-10-31 LAB — GLUCOSE, CAPILLARY
Glucose-Capillary: 155 mg/dL — ABNORMAL HIGH (ref 70–99)
Glucose-Capillary: 155 mg/dL — ABNORMAL HIGH (ref 70–99)
Glucose-Capillary: 163 mg/dL — ABNORMAL HIGH (ref 70–99)
Glucose-Capillary: 165 mg/dL — ABNORMAL HIGH (ref 70–99)
Glucose-Capillary: 171 mg/dL — ABNORMAL HIGH (ref 70–99)
Glucose-Capillary: 171 mg/dL — ABNORMAL HIGH (ref 70–99)
Glucose-Capillary: 177 mg/dL — ABNORMAL HIGH (ref 70–99)
Glucose-Capillary: 177 mg/dL — ABNORMAL HIGH (ref 70–99)
Glucose-Capillary: 186 mg/dL — ABNORMAL HIGH (ref 70–99)
Glucose-Capillary: 210 mg/dL — ABNORMAL HIGH (ref 70–99)
Glucose-Capillary: 80 mg/dL (ref 70–99)

## 2021-10-31 LAB — POCT I-STAT 7, (LYTES, BLD GAS, ICA,H+H)
Acid-base deficit: 4 mmol/L — ABNORMAL HIGH (ref 0.0–2.0)
Acid-base deficit: 5 mmol/L — ABNORMAL HIGH (ref 0.0–2.0)
Acid-base deficit: 6 mmol/L — ABNORMAL HIGH (ref 0.0–2.0)
Bicarbonate: 19.5 mmol/L — ABNORMAL LOW (ref 20.0–28.0)
Bicarbonate: 21.3 mmol/L (ref 20.0–28.0)
Bicarbonate: 21.5 mmol/L (ref 20.0–28.0)
Calcium, Ion: 1.01 mmol/L — ABNORMAL LOW (ref 1.15–1.40)
Calcium, Ion: 1.09 mmol/L — ABNORMAL LOW (ref 1.15–1.40)
Calcium, Ion: 1.1 mmol/L — ABNORMAL LOW (ref 1.15–1.40)
HCT: 47 % (ref 39.0–52.0)
HCT: 48 % (ref 39.0–52.0)
HCT: 51 % (ref 39.0–52.0)
Hemoglobin: 16 g/dL (ref 13.0–17.0)
Hemoglobin: 16.3 g/dL (ref 13.0–17.0)
Hemoglobin: 17.3 g/dL — ABNORMAL HIGH (ref 13.0–17.0)
O2 Saturation: 95 %
O2 Saturation: 95 %
O2 Saturation: 96 %
Patient temperature: 100.3
Patient temperature: 101.3
Patient temperature: 103.2
Potassium: 3.8 mmol/L (ref 3.5–5.1)
Potassium: 4 mmol/L (ref 3.5–5.1)
Potassium: 4.4 mmol/L (ref 3.5–5.1)
Sodium: 138 mmol/L (ref 135–145)
Sodium: 140 mmol/L (ref 135–145)
Sodium: 140 mmol/L (ref 135–145)
TCO2: 21 mmol/L — ABNORMAL LOW (ref 22–32)
TCO2: 23 mmol/L (ref 22–32)
TCO2: 23 mmol/L (ref 22–32)
pCO2 arterial: 40.1 mmHg (ref 32–48)
pCO2 arterial: 41.2 mmHg (ref 32–48)
pCO2 arterial: 45.4 mmHg (ref 32–48)
pH, Arterial: 7.287 — ABNORMAL LOW (ref 7.35–7.45)
pH, Arterial: 7.306 — ABNORMAL LOW (ref 7.35–7.45)
pH, Arterial: 7.329 — ABNORMAL LOW (ref 7.35–7.45)
pO2, Arterial: 90 mmHg (ref 83–108)
pO2, Arterial: 94 mmHg (ref 83–108)
pO2, Arterial: 94 mmHg (ref 83–108)

## 2021-10-31 LAB — CBC
HCT: 46.8 % (ref 39.0–52.0)
HCT: 46.1 % (ref 39.0–52.0)
Hemoglobin: 15.9 g/dL (ref 13.0–17.0)
Hemoglobin: 16.2 g/dL (ref 13.0–17.0)
MCH: 32.7 pg (ref 26.0–34.0)
MCH: 33.4 pg (ref 26.0–34.0)
MCHC: 34 g/dL (ref 30.0–36.0)
MCHC: 35.1 g/dL (ref 30.0–36.0)
MCV: 96.3 fL (ref 80.0–100.0)
MCV: 95.1 fL (ref 80.0–100.0)
Platelets: 11 10*3/uL — CL (ref 150–400)
Platelets: 11 10*3/uL — CL (ref 150–400)
RBC: 4.86 MIL/uL (ref 4.22–5.81)
RBC: 4.85 MIL/uL (ref 4.22–5.81)
RDW: 14.4 % (ref 11.5–15.5)
RDW: 14.5 % (ref 11.5–15.5)
WBC: 4.1 10*3/uL (ref 4.0–10.5)
WBC: 7.7 10*3/uL (ref 4.0–10.5)
nRBC: 0 % (ref 0.0–0.2)
nRBC: 0 % (ref 0.0–0.2)

## 2021-10-31 LAB — TYPE AND SCREEN
ABO/RH(D): A POS
Antibody Screen: NEGATIVE

## 2021-10-31 LAB — ECHOCARDIOGRAM COMPLETE
AR max vel: 2.54 cm2
AV Peak grad: 10.6 mmHg
Ao pk vel: 1.63 m/s
Area-P 1/2: 3.63 cm2
Calc EF: 63 %
Height: 70 in
S' Lateral: 3 cm
Single Plane A2C EF: 66.2 %
Single Plane A4C EF: 57.5 %
Weight: 3435.65 oz

## 2021-10-31 LAB — PROTIME-INR
INR: 1.3 — ABNORMAL HIGH (ref 0.8–1.2)
Prothrombin Time: 16.2 seconds — ABNORMAL HIGH (ref 11.4–15.2)

## 2021-10-31 LAB — COOXEMETRY PANEL
Carboxyhemoglobin: <0.3 % — ABNORMAL LOW (ref 0.5–1.5)
Methemoglobin: <0.7 % (ref 0.0–1.5)
O2 Saturation: 76.2 %
Total hemoglobin: 13.3 g/dL (ref 12.0–16.0)

## 2021-10-31 LAB — TRIGLYCERIDES: Triglycerides: 237 mg/dL — ABNORMAL HIGH (ref <150)

## 2021-10-31 LAB — HEMOGLOBIN A1C
Hgb A1c MFr Bld: 7.3 % — ABNORMAL HIGH (ref 4.8–5.6)
Mean Plasma Glucose: 162.81 mg/dL

## 2021-10-31 LAB — LACTIC ACID, PLASMA
Lactic Acid, Venous: 4.8 mmol/L (ref 0.5–1.9)
Lactic Acid, Venous: 4.5 mmol/L (ref 0.5–1.9)

## 2021-10-31 LAB — ABO/RH: ABO/RH(D): A POS

## 2021-10-31 LAB — AMMONIA: Ammonia: 48 umol/L — ABNORMAL HIGH (ref 9–35)

## 2021-10-31 LAB — MRSA NEXT GEN BY PCR, NASAL: MRSA by PCR Next Gen: NOT DETECTED

## 2021-10-31 LAB — MAGNESIUM: Magnesium: 1.9 mg/dL (ref 1.7–2.4)

## 2021-10-31 MED ORDER — SODIUM CHLORIDE 0.9 % IV SOLN
INTRAVENOUS | Status: DC | PRN
  Administered 2021-10-31: 10 mL/h via INTRAVENOUS

## 2021-10-31 MED ORDER — VASOPRESSIN 20 UNITS/100 ML INFUSION FOR SHOCK
0.0300 [IU]/min | INTRAVENOUS | Status: DC
  Administered 2021-10-31 (×2): 0.03 [IU]/min via INTRAVENOUS
  Filled 2021-10-31 (×2): qty 100

## 2021-10-31 MED ORDER — ALBUMIN HUMAN 25 % IV SOLN
12.5000 g | Freq: Once | INTRAVENOUS | Status: AC
Start: 2021-10-31 — End: 2021-10-31
  Administered 2021-10-31: 12.5 g via INTRAVENOUS
  Filled 2021-10-31: qty 50

## 2021-10-31 MED ORDER — SODIUM CHLORIDE 0.9% IV SOLUTION: Freq: Once | INTRAVENOUS | Status: AC

## 2021-10-31 MED ORDER — NOREPINEPHRINE 16 MG/250ML-% IV SOLN
0.0000 ug/min | INTRAVENOUS | Status: DC
  Administered 2021-10-31: 23 ug/min via INTRAVENOUS
  Administered 2021-10-31: 40 ug/min via INTRAVENOUS
  Filled 2021-10-31 (×2): qty 250

## 2021-10-31 MED ORDER — EPINEPHRINE HCL 5 MG/250ML IV SOLN IN NS
0.5000 ug/min | INTRAVENOUS | Status: DC
  Administered 2021-10-31: 0.5 ug/min via INTRAVENOUS
  Filled 2021-10-31: qty 250

## 2021-10-31 MED ORDER — ACETAMINOPHEN 160 MG/5ML PO SOLN
650.0000 mg | Freq: Three times a day (TID) | ORAL | Status: DC | PRN
  Administered 2021-10-31: 650 mg
  Filled 2021-10-31: qty 20.3

## 2021-10-31 MED ORDER — METHYLPREDNISOLONE SODIUM SUCC 40 MG IJ SOLR
40.0000 mg | Freq: Two times a day (BID) | INTRAMUSCULAR | Status: DC
  Administered 2021-10-31: 40 mg via INTRAVENOUS
  Filled 2021-10-31: qty 1

## 2021-10-31 MED ORDER — INSULIN ASPART 100 UNIT/ML IJ SOLN: 0.0000 [IU] | INTRAMUSCULAR | Status: DC

## 2021-10-31 MED ORDER — PANTOPRAZOLE SODIUM 40 MG IV SOLR
40.0000 mg | Freq: Two times a day (BID) | INTRAVENOUS | Status: DC
  Administered 2021-10-31: 40 mg via INTRAVENOUS
  Filled 2021-10-31: qty 10

## 2021-10-31 MED ORDER — PIPERACILLIN-TAZOBACTAM 3.375 G IVPB: 3.3750 g | Freq: Three times a day (TID) | INTRAVENOUS | Status: DC

## 2021-10-31 MED ORDER — CEFAZOLIN SODIUM-DEXTROSE 2-4 GM/100ML-% IV SOLN
2.0000 g | Freq: Two times a day (BID) | INTRAVENOUS | Status: DC
  Administered 2021-10-31: 2 g via INTRAVENOUS
  Filled 2021-10-31 (×2): qty 100

## 2021-10-31 MED ORDER — CALCIUM GLUCONATE-NACL 1-0.675 GM/50ML-% IV SOLN
1.0000 g | Freq: Once | INTRAVENOUS | Status: AC
Start: 2021-10-31 — End: 2021-10-31
  Administered 2021-10-31: 1000 mg via INTRAVENOUS
  Filled 2021-10-31: qty 50

## 2021-10-31 MED ORDER — INSULIN GLARGINE-YFGN 100 UNIT/ML ~~LOC~~ SOLN
20.0000 [IU] | Freq: Every day | SUBCUTANEOUS | Status: DC
  Administered 2021-10-31: 20 [IU] via SUBCUTANEOUS
  Filled 2021-10-31 (×2): qty 0.2

## 2021-11-01 LAB — PREPARE PLATELET PHERESIS
Unit division: 0
Unit division: 0

## 2021-11-01 LAB — BPAM PLATELET PHERESIS
Blood Product Expiration Date: 202307252359
Blood Product Expiration Date: 202307262359
ISSUE DATE / TIME: 202307231032
Unit Type and Rh: 6200
Unit Type and Rh: 6200

## 2021-11-02 LAB — CULTURE, BLOOD (ROUTINE X 2): Special Requests: ADEQUATE

## 2021-11-02 LAB — URINE CULTURE: Culture: >=100000 — AB

## 2021-11-03 NOTE — Progress Notes (Signed)
Remote pacemaker transmission.   

## 2021-11-03 NOTE — Addendum Note (Signed)
Addended by: Cheri Kearns A on: 11/03/2021 12:52 PM   Modules accepted: Level of Service

## 2021-11-09 NOTE — Progress Notes (Addendum)
Pharmacy Antibiotic Note  Ralph [name] is a 71 y.o. male admitted on 11/01/2021 with septic shock MSSA bacteremia.  Pharmacy has been consulted to change Zosyn to Cefazolin.  Plan: Start Cefazolin 2 gm IV q12hr Monitor renal function  ADDENDUM: Due to the potential polymicrobial nature of an liver abscess, the primary team would prefer to remain on Zosyn at this time.  D/c Cefazolin Start Zosyn 3.375 gm IV q8hr (4hour infusion)  Height: '5\' 10"'$  (177.8 cm) Weight: 97.4 kg (214 lb 11.7 oz) IBW/kg (Calculated) : 73  Temp (24hrs), Avg:102.1 F (38.9 C), Min:100.1 F (37.8 C), Max:104.3 F (40.2 C)  Recent Labs  Lab 10/18/2021 1614 10/28/2021 1749 11-21-21 0224 11/21/2021 0347 2021-11-21 0643 2021-11-21 1019  WBC 7.7  --   --  4.1  --  7.7  CREATININE 2.85*  --  3.58*  --   --   --   LATICACIDVEN >9.0* 6.8* 4.8*  --  4.5*  --      Estimated Creatinine Clearance: 23.1 mL/min (A) (by C-G formula based on SCr of 3.58 mg/dL (H)).    Allergies  Allergen Reactions   Nitroglycerin Hives, Swelling and Rash    Thank you for allowing pharmacy to be a part of this patient's care.  Alanda Slim, PharmD, [facility] Clinical Pharmacist Please see AMION for all Pharmacists' Contact Phone Numbers 11-21-21, 12:27 PM

## 2021-11-09 NOTE — Consult Note (Addendum)
Chief Complaint: Hepatic abscess  Referring Physician(s): Hunsucker, Bonna Gains, MD  Supervising Physician: Markus Daft  Patient Status: [facility] - In-pt  History of Present Illness: Ralph [name] is a 71 y.o. male with medical issues including NASH cirrhosis, esophageal varices, dysphagia, Hepatocellular carcinoma, and Afib-- on Xarelto.  He is known to our service. He is s/p radiation segmentectomy of his liver tumor with Y90 on 05/24/2021 by Dr. Serafina Royals.  He then had a laparoscopic liver ablation at [facility] in [place] in May by Dr. Raeanne Gathers.  He presented to the ED at [facility] yesterday with respiratory distress.   Per chart, he was found unresponsive by family.   On EMS arrival hypoxemic with sats 70s, Hypotensive SBP 80s, hyperglycemic and in Afib RVR.   Upon arrival to the ED was intubated and started on pressors.  Labs showed severe lactic acidosis, AKI on CKD, Hyperglycemia,   CT scan showed= 5.8 cm fluid and gas collection at the site of the patient's microwave ablation in the medial left hepatic lobe, compatible with tissue necrosis and post treatment abscess.  We are asked to evaluate him for abscess drainage.  Past Medical History:  Diagnosis Date   Anginal pain ([place])    Arthritis    "back; fingers" (01/06/2012)   Atrial flutter (HCC)    s/p EPS +RF ablation of typical atrial flutter April 2015   Cancer [facility]    skin cancer   CHB (complete heart block) (HCC)    CHF (congestive heart failure) ([employer]) 01/06/2012   Chronic lower back pain    Cirrhosis ([place])    NASH-Hep A and B immune   Coughing up blood    "comes from my throat" (01/06/2012)   DDD (degenerative disc disease), lumbar    Depressed    Difficult intubation    Eschmann stylet used in 2002 and 2007; "trouble waking up afterwards" (01/06/2012)   Emphysema    Fatty liver disease, nonalcoholic    GERD (gastroesophageal reflux disease)    H/O hiatal hernia    Headache    History  of esophageal varices    Hypertension    Hypothyroidism    Liver cancer ([place])    Orthostatic dizziness    Pacemaker    Pneumonia 11/2014   Presence of permanent cardiac pacemaker 9/292013   [place]   Sinus pause 01/06/2012   5.2 seconds   Sleep apnea    "don't wear mask" (01/06/2012)   Stroke [facility])    pt states that he might have had a stroke not sure   Type II diabetes mellitus ([place])    Varicose vein    of esophagus    Past Surgical History:  Procedure Laterality Date   ATRIAL FLUTTER ABLATION N/A 07/10/2013   Procedure: ATRIAL FLUTTER ABLATION;  Surgeon: Evans Lance, MD;  Location: [facility] CATH LAB;  Service: Cardiovascular;  Laterality: N/A;   BACK SURGERY     CHOLECYSTECTOMY  1993   COLONOSCOPY  11/08/2004   ZHG:DJMEQA rectum, colon, TI.   COLONOSCOPY N/A 05/28/2014   Dr. Gala Romney: Redundant colon. single colonic polyp removed as described above. Tubular adenoma   COLONOSCOPY WITH PROPOFOL N/A 09/02/2021   Procedure: COLONOSCOPY WITH PROPOFOL;  Surgeon: Daneil Dolin, MD;  Location: [facility] ENDO SUITE;  Service: Endoscopy;  Laterality: N/A;  9:15AM   ESOPHAGEAL DILATION N/A 05/28/2014   Procedure: ESOPHAGEAL DILATION;  Surgeon: Daneil Dolin, MD;  Location: [facility] ENDO SUITE;  Service: Endoscopy;  Laterality: N/A;  ESOPHAGOGASTRODUODENOSCOPY  11/08/2004   NKN:LZJQBH esophageal erosions consistent with erosive reflux esophagitis/Areas of hemorrhage and nodularity of the fundal mucosa of uncertain significance, biopsied.  Small hiatal hernia, otherwise normal stomach   ESOPHAGOGASTRODUODENOSCOPY  2010   Dr. Gala Romney: 3 columns Grade 1 varices, erosive esophagitis, HH, portal gastropathy, normal D1, D2   ESOPHAGOGASTRODUODENOSCOPY N/A 05/28/2014   Dr. Gala Romney: MIld erosive reflux esophagitis. Grade 1 esophageal varices. Patent esophagus. No dilation performed. Hiatal hernia.    ESOPHAGOGASTRODUODENOSCOPY (EGD) WITH ESOPHAGEAL DILATION N/A 02/14/2013   ALP:FXTKW 1 esophageal varices. Abnormal  distal esophagus/status post biopsy after Maloney dilation. Portal gastropathy. Antral erosions-status post biopsy. path negative for H.pylori, benign path.   ESOPHAGOGASTRODUODENOSCOPY (EGD) WITH PROPOFOL N/A 09/02/2021   Procedure: ESOPHAGOGASTRODUODENOSCOPY (EGD) WITH PROPOFOL;  Surgeon: Daneil Dolin, MD;  Location: [facility] ENDO [facility]ITE;  Service: Endoscopy;  Laterality: N/A;   IR 3D INDEPENDENT WKST  05/24/2021   IR ANGIOGRAM SELECTIVE EACH ADDITIONAL VESSEL  04/30/2021   IR ANGIOGRAM SELECTIVE EACH ADDITIONAL VESSEL  04/30/2021   IR ANGIOGRAM SELECTIVE EACH ADDITIONAL VESSEL  04/30/2021   IR ANGIOGRAM SELECTIVE EACH ADDITIONAL VESSEL  04/30/2021   IR ANGIOGRAM SELECTIVE EACH ADDITIONAL VESSEL  04/30/2021   IR ANGIOGRAM SELECTIVE EACH ADDITIONAL VESSEL  05/24/2021   IR ANGIOGRAM VISCERAL SELECTIVE  04/30/2021   IR ANGIOGRAM VISCERAL SELECTIVE  04/30/2021   IR EMBO ARTERIAL NOT HEMORR HEMANG INC GUIDE ROADMAPPING  04/30/2021   IR EMBO TUMOR ORGAN ISCHEMIA INFARCT INC GUIDE ROADMAPPING  05/24/2021   IR RADIOLOGIST EVAL & MGMT  03/03/2021   IR US GUIDE VASC ACCESS RIGHT  04/30/2021   IR US GUIDE VASC ACCESS RIGHT  05/24/2021   LUMBAR [place] SURGERY  1994; ~ 1995; ~ [place]   nuclear stress test  10/19/2004   No ischemia   PERMANENT PACEMAKER INSERTION  01/08/2012   CHB   PERMANENT PACEMAKER INSERTION N/A 01/09/2012   Procedure: PERMANENT PACEMAKER INSERTION;  Surgeon: Sanda Klein, MD;  Location: [place] CATH LAB;  Service: Cardiovascular;  Laterality: N/A;   POSTERIOR FUSION LUMBAR SPINE  1999   L4-5   SPINAL CORD STIMULATOR IMPLANT  2006   SPINAL CORD STIMULATOR REMOVAL N/A 01/27/2015   Procedure: LUMBAR SPINAL CORD STIMULATOR REMOVAL;  Surgeon: Kristeen Miss, MD;  Location: [place] NEURO ORS;  Service: Neurosurgery;  Laterality: N/A;  LUMBAR SPINAL CORD STIMULATOR REMOVAL   TONSILLECTOMY AND ADENOIDECTOMY  1992   US ECHOCARDIOGRAPHY  12/28/2011   mild LVH,mild mitral annulara ca+,mild MR    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 Authorizing Provider  ACCU-CHEK GUIDE test strip TEST TWICE A DAY BEFORE MEALS 01/13/20   Shamleffer, Melanie Crazier, MD  acetaminophen (TYLENOL) 500 MG tablet Take 500 mg by mouth every 6 (six) hours as needed for moderate pain.    [provider]  Blood Glucose Monitoring Suppl (ACCU-CHEK AVIVA) device by Other route. Use as instructed to check blood sugar 2 times daily    [provider]  ciclopirox (PENLAC) 8 % solution APPLY TOPICALLY AT BEDTIME. APPLY OVER NAIL AND SURROUNDING SKIN. APPLY DAILY OVER PREVIOUS COAT. AFTER SEVEN (7) DAYS, MAY REMOVE WITH ALCOHOL AND CONTINUE CYCLE. 07/14/21   Lorenda Peck, DPM  Continuous Blood Gluc Receiver (DEXCOM G6 RECEIVER) DEVI Use as instruct to check blood sugar daily 05/18/20   Shamleffer, Melanie Crazier, MD  Continuous  Blood Gluc Sensor (DEXCOM G6 SENSOR) MISC 1 Device by Does not apply route as directed. 04/30/20   Shamleffer, Melanie Crazier, MD  Continuous Blood Gluc Transmit (DEXCOM G6 TRANSMITTER) MISC 1 Device by Does not apply route as directed. 04/30/20   Shamleffer, Melanie Crazier, MD  dapagliflozin propanediol (FARXIGA) 10 MG TABS tablet Take 1 tablet (10 mg total) by mouth daily. 05/03/21   Shamleffer, Melanie Crazier, MD  diazepam (VALIUM) 10 MG tablet Take 1 tablet by mouth every 8 (eight) hours as needed (muscle spasms). 05/20/19   [provider]  diltiazem (CARDIZEM CD) 300 MG 24 hr capsule TAKE 1 CAPSULE BY MOUTH EVERY DAY 07/12/21   Arnoldo Lenis, MD  escitalopram (LEXAPRO) 10 MG tablet Take 10 mg by mouth daily. 04/19/19   [provider]  flecainide (TAMBOCOR) 100 MG tablet TAKE 1 TABLET BY MOUTH TWICE A DAY 05/21/21   Evans Lance, MD  furosemide (LASIX) 40 MG tablet Take 40 mg by mouth daily as needed for fluid or edema.    [provider]  Insulin Aspart FlexPen (NOVOLOG) 100 UNIT/ML Max daily 50 units Patient taking differently: Inject 10-14 Units into the skin 3 (three) times daily before meals. Max daily 50 units 05/03/21   Shamleffer, Melanie Crazier, MD  Insulin Degludec FlexTouch 100 UNIT/ML SOPN Inject 32 Units into the skin daily. 05/03/21   Shamleffer, Melanie Crazier, MD  Insulin Pen Needle 31G X 5 MM MISC 1 Device by Does not apply route in the morning, at noon, in the evening, and at bedtime. 05/03/21   Shamleffer, Melanie Crazier, MD  levothyroxine (SYNTHROID, LEVOTHROID) 112 MCG tablet TAKE 1 TABLET BY MOUTH EVERY MORNING BEFORE BREAKFAST 04/24/17   Cassandria Anger, MD  metFORMIN (GLUCOPHAGE) 1000 MG tablet Take 1 tablet (1,000 mg total) by mouth 2 (two) times daily with a meal. 05/03/21   Shamleffer, Melanie Crazier, MD  Omega-3 Fatty Acids (FISH OIL) 1000 MG CAPS Take 1,000 mg by mouth in the morning and at bedtime.    [provider]  oxymetazoline (AFRIN) 0.05 % nasal spray Place 1 spray into both nostrils 2 (two) times daily as needed for congestion. Sinex brand    [provider]  pantoprazole (PROTONIX) 40 MG tablet Take 1 tablet (40 mg total) by mouth daily. 03/01/21   Erenest Rasher, PA-C  Polyethyl Glycol-Propyl Glycol (SYSTANE) 0.4-0.3 % SOLN Place 1 drop into both eyes every 6 (six) hours as needed (dry eyes).    [provider]  polyethylene glycol-electrolytes (TRILYTE) 420 g solution Take 4,000 mLs by mouth as directed. 08/24/21   Rourk, [name], MD  propranolol (INDERAL) 20 MG tablet TAKE 1 TABLET BY MOUTH TWICE A DAY 02/23/21   Mahala Menghini, PA-C  rivaroxaban (XARELTO) 20 MG TABS tablet TAKE 1 TABLET BY MOUTH EVERY DAY WITH DINNER 05/09/18   Evans Lance, MD  simvastatin (ZOCOR) 20 MG tablet TAKE 1 TABLET BY MOUTH EVERY DAY IN THE EVENING 09/16/21   Arnoldo Lenis, MD  spironolactone (ALDACTONE) 50 MG tablet TAKE 1 TABLET BY MOUTH TWICE A DAY 02/23/21    Mahala Menghini, PA-C     Family History  Problem Relation Age of Onset   Cancer Mother        Deceased, 45   Ovarian cancer Mother    Arrhythmia Father    Other Father        Deceased 30   Stroke Brother    Stroke Brother  Stroke Sister    Crohn's disease Daughter    Diabetes Maternal Grandmother    Colon cancer Neg Hx     Social History   Socioeconomic History   Marital status: Married    Spouse name: Not on file   Number of children: Not on file   Years of education: Not on file   Highest education level: Not on file  Occupational History   Not on file  Tobacco Use   Smoking status: Every Day    Packs/day: 0.50    Years: 45.00    Total pack years: 22.50    Types: Cigarettes    Start date: 04/11/1968   Smokeless tobacco: Never   Tobacco comments:    Quit x 8 months this time  Vaping Use   Vaping Use: Never used  Substance and Sexual Activity   Alcohol use: No    Alcohol/week: 0.0 standard drinks of alcohol    Comment: "quit alcohol 2011" Previously drinking socially about twice per month   Drug use: No   Sexual activity: Never  Other Topics Concern   Not on file  Social History Narrative   Lives with wife in a one story home.  Has 3 children.     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     Education: some college.   Social Determinants of Health   Financial Resource Strain: Not on file  Food Insecurity: Not on file  Transportation Needs: Not on file  Physical Activity: Not on file  Stress: Not on file  Social Connections: Not on file     Review of Systems  Unable to perform ROS: Intubated    Vital Signs: BP (!) 126/54   Pulse (!) 125   Temp 100.1 F (37.8 C) (Oral)   Resp (!) 34   Ht '5\' 10"'$  (1.778 m)   Wt 214 lb 11.7 oz (97.4 kg)   SpO2 96%   BMI 30.81 kg/m   Physical Exam Vitals reviewed.  Constitutional:      Appearance: He is ill-appearing.  Cardiovascular:     Rate and Rhythm: Tachycardia present.  Pulmonary:     Comments:  Intubated/Vent Abdominal:     Palpations: Abdomen is soft.  Skin:    Comments: Knees and feet starting to mottle due to vasopressors.     Imaging: Portable Chest x-ray  Result Date: 10/23/2021 CLINICAL DATA:  [id number].  Respiratory distress. EXAM: PORTABLE CHEST 1 VIEW COMPARISON:  Chest x-ray 11/03/2021 7:27 p.m., CT chest 04/06/2021, CT abdomen pelvis 10/28/2021 FINDINGS: Left chest wall 2 lead pacemaker in similar position. Endotracheal tube with tip 2.5 cm above the carina. Endotracheal tube with tip and side port coursing below the hemidiaphragm overlying the expected region of the gastric lumen. Right internal jugular central venous catheter with tip overlying superior cavoatrial junction. The heart and mediastinal contours are unchanged. Aortic calcification. Query developing bilateral patchy airspace opacities. Chronic coarsened markings. Bilateral trace pleural effusions. No pneumothorax. No acute osseous abnormality. IMPRESSION: 1. Query developing bilateral patchy airspace opacities. Followup PA and lateral chest X-ray is recommended in 3-4 weeks following therapy to ensure resolution and exclude underlying malignancy. 2. Bilateral trace pleural effusions. 3. Aortic Atherosclerosis (ICD10-I70.0) and Emphysema (ICD10-J43.9). Electronically Signed   By: Iven Finn [name]   On: 11/03/2021 22:59   CT ABDOMEN PELVIS WO CONTRAST  Result Date: 11/08/2021 CLINICAL DATA:  Nausea/vomiting, unresponsive. Per EPIC, the patient is status post laparoscopic microwave ablation of the liver on May 10th. EXAM: CT ABDOMEN  AND PELVIS WITHOUT CONTRAST TECHNIQUE: Multidetector CT imaging of the abdomen and pelvis was performed following the standard protocol without IV contrast. RADIATION DOSE REDUCTION: This exam was performed according to the departmental dose-optimization program which includes automated exposure control, adjustment of the mA and/or kV according to patient size and/or use of iterative  reconstruction technique. COMPARISON:  CT abdomen dated 07/30/2020. FINDINGS: Lower chest: Small bilateral pleural effusions. Mild patchy right middle lobe and bilateral lower lobe opacities, favoring atelectasis. Hepatobiliary: Nodular hepatic contour, suggesting cirrhosis. 5.3 x 5.2 x 5.8 cm fluid and gas collection at the site of the patient's microwave ablation in the medial left hepatic lobe (series 2/image 29). There is associated gas extending beyond the liver capsule (series 2/image 28). This reflects tissue necrosis with post treatment abscess. Two 12 mm metallic clips/markers are present within the procedural bed, presumably related to biopsy, although correlation with the procedure is suggested. Status post cholecystectomy. No intrahepatic or extrahepatic duct dilatation. Pancreas: Within normal limits. Spleen: Within normal limits. Adrenals/Urinary Tract: Stable low-density nodularity of the bilateral adrenal glands, suggesting benign adrenal adenomas. Kidneys are within normal limits. No renal calculi or hydronephrosis. Bladder is decompressed by an indwelling Foley catheter. Stomach/Bowel: Enteric tube terminates in the proximal gastric body. No evidence of bowel obstruction. Normal appendix (series 2/image 81). No colonic wall thickening or inflammatory changes. Vascular/Lymphatic: No evidence of abdominal aortic aneurysm. Atherosclerotic calcifications of the abdominal aorta and branch vessels. Small upper abdominal lymph nodes, likely reactive. Reproductive: Prostate is unremarkable. Other: No abdominopelvic ascites. Tiny fat containing left inguinal hernia (series 2/image 89). Musculoskeletal: Status post PLIF at L1-2. Degenerative changes of the lower thoracic spine. IMPRESSION: 5.8 cm fluid and gas collection at the site of the patient's microwave ablation in the medial left hepatic lobe, compatible with tissue necrosis and post treatment abscess. Small bilateral pleural effusions. Mild patchy  right middle lobe and bilateral lower lobe opacities, favoring atelectasis. Additional ancillary findings as above. Electronically Signed   By: Julian Hy [name]   On: 11/06/2021 20:55   CT Head Wo Contrast  Result Date: 10/19/2021 CLINICAL DATA:  Altered mental status EXAM: CT HEAD WITHOUT CONTRAST TECHNIQUE: Contiguous axial images were obtained from the base of the skull through the vertex without intravenous contrast. RADIATION DOSE REDUCTION: This exam was performed according to the departmental dose-optimization program which includes automated exposure control, adjustment of the mA and/or kV according to patient size and/or use of iterative reconstruction technique. COMPARISON:  None Available. FINDINGS: Brain: No evidence of acute infarction, hemorrhage, hydrocephalus, extra-axial collection or mass lesion/mass effect. Vascular: No hyperdense vessel or unexpected calcification. Skull: Normal. Negative for fracture or focal lesion. Sinuses/Orbits: No acute finding. Other: None. IMPRESSION: Normal head CT. Electronically Signed   By: Julian Hy [name]   On: 11/08/2021 20:32   DG Chest 1V REPEAT Same Day  Result Date: 11/03/2021 CLINICAL DATA:  Central line placement verification.  [id number] EXAM: CHEST - 1 VIEW SAME DAY COMPARISON:  Cxr 10/27/2021 FINDINGS: Endotracheal tube with tip terminating 3 cm above the carina. Enteric tube coursing below the hemidiaphragm with side port overlying the expected region of the gastric lumen and tip collimated off view. Right internal jugular central venous catheter with tip overlying the expected region just distal to the superior cavoatrial junction. The heart and mediastinal contours are unchanged. Left chest wall 2 lead cardiac pacemaker in similar position. Cardiac paddles overlie the chest. Right base airspace opacity. Coarsened markings with no overt pulmonary edema. No pleural  effusion. No pneumothorax. No acute osseous abnormality. IMPRESSION: 1. Right  internal jugular central venous catheter with tip overlying the expected region just distal to the superior cavoatrial junction. 2. Right base airspace opacity. Finding could represent a combination of atelectasis versus infection/inflammation. 3.  Emphysema (ICD10-J43.9). Electronically Signed   By: Iven Finn [name]   On: 11/02/2021 19:27   DG Chest Port 1V same Day  Result Date: 10/15/2021 CLINICAL DATA:  ET and orogastric tube placement. EXAM: PORTABLE CHEST 1 VIEW COMPARISON:  10/26/2021 at 4:33 p.m. FINDINGS: Endotracheal tube tip projects 3.8 cm above the carina. Orogastric tube passes below the diaphragm into the mid stomach. Bilateral coarse interstitial opacities are without change. IMPRESSION: 1. Well-positioned endotracheal tube and nasal/orogastric tube. Electronically Signed   By: Lajean Manes [name]   On: 10/19/2021 18:04   DG Chest Port 1 View  Result Date: 10/21/2021 CLINICAL DATA:  Respiratory distress.  Possible sepsis. EXAM: PORTABLE CHEST 1 VIEW COMPARISON:  12/02/2017 FINDINGS: Left-sided pacemaker unchanged. Lungs are somewhat hypoinflated. No pleural effusion or pneumothorax. Mild diffuse bilateral interstitial prominence which could be due to vascular congestion versus interstitial pneumonitis. Stable chronic changes over the left upper lobe/apex. Cardiomediastinal silhouette and remainder of the exam is unchanged. IMPRESSION: Mild diffuse bilateral interstitial prominence which could be due to vascular congestion versus interstitial pneumonitis. Electronically Signed   By: Marin Olp [name]   On: 10/26/2021 16:41   CUP PACEART REMOTE DEVICE CHECK  Result Date: 10/16/2021 Scheduled remote reviewed. Normal device function.  4 false ATR episode due to atrial events falling into blanking, all brief. Next remote 91 days. LKV   Labs:  CBC: Recent Labs    04/30/21 0802 05/24/21 0800 10/19/2021 1614 November 09, 2021 0013 Nov 09, 2021 0347 2021/11/09 0616  WBC 8.9 8.0 7.7  --  4.1  --    HGB 17.5* 17.9* 19.3* 17.3* 15.9 16.3  HCT 51.9 53.1* 58.8* 51.0 46.8 48.0  PLT 181 160 24*  --  11*  --     COAGS: Recent Labs    02/18/21 0630 04/30/21 0802 05/24/21 0800 10/20/2021 1705  INR 1.1 1.4* 1.3* 1.4*  APTT  --   --   --  34    BMP: Recent Labs    04/30/21 0802 05/24/21 0800 10/21/2021 1614 November 09, 2021 0013 11/09/21 0224 11/09/21 0616  NA 136 133* 136 140 139 138  K 5.9* 4.7 3.8 4.0 3.7 3.8  CL 102 101 95*  --  104  --   CO2 24 25 17*  --  22  --   GLUCOSE 170* 185* 459*  --  163*  --   BUN 30* 28* 60*  --  66*  --   CALCIUM 8.9 9.1 8.7*  --  7.5*  --   CREATININE 0.78 0.64 2.85*  --  3.58*  --   GFRNONAA >60 >60 23*  --  18*  --     LIVER FUNCTION TESTS: Recent Labs    12/16/20 0826 04/30/21 0802 05/24/21 0800 10/17/2021 1614  BILITOT 0.5 1.4* 0.6 2.9*  AST 33 41 29 53*  ALT 53* 46* 46* <5  ALKPHOS 179* 122 111 181*  PROT 7.2 8.1 7.6 7.6  ALBUMIN 4.6 4.4 4.2 3.0*    TUMOR MARKERS: No results for input(s): "AFPTM", "CEA", "CA199", "CHROMGRNA" in the last 8760 hours.  Assessment and Plan:  History of radiation segmentectomy and microwave ablation of a liver tumor, now with a 5.8 cm fluid and gas collection at the site of  the patient's microwave ablation in the medial left hepatic lobe, compatible with tissue necrosis and post treatment abscess. Patient is critically ill with bacteremia and sepsis.   Dr. Anselm Pancoast has discussed the best plan of care for this patient with the CCM team.  Blood cultures are unusual for liver primary source but images are highly concerning for an infectious process in the liver.  Interestingly, most recent CT from [place] in June also had gas at the treatment site which seems unusual for an ablation in May.  His platelet count has dropped significantly and is currently only 11, 000.  We are considering a bedside US guided aspiration / drainage of the liver gas collection but holding off on any percutaneous procedures due to the  low platelets. Unfortunately, patient is critically ill and may not survive this acute illness. We discussed an image guided aspiration /  drainage with the patient's wife but we will hold off for now due to low platelets and critical condition.  IR is available if needed.  Thank you for allowing our service to participate in Ralph [name] 's care.  Electronically Signed: Murrell Redden, PA-C   13-Nov-2021, 8:50 AM      I spent a total of    40 Minutes in face to face in clinical consultation, greater than 50% of which was counseling/coordinating care for hepatic abscess drain.

## 2021-11-09 NOTE — Progress Notes (Signed)
Echocardiogram not complete, physician in room. Will re-attempt at another time.   Ralph [name]

## 2021-11-09 NOTE — Procedures (Signed)
Arterial Catheter Insertion Procedure Note  Ralph [name]  [id number]  Oct 25, 1950  Date:11/01/21  Time:6:13 AM    Provider Performing: Ulice Dash    Procedure: Insertion of Arterial Line [id number]) with US guidance ([id number])   Indication(s) Blood pressure monitoring and/or need for frequent ABGs  Consent Risks of the procedure as well as the alternatives and risks of each were explained to the patient and/or caregiver.  Consent for the procedure was obtained and is signed in the bedside chart  Anesthesia None   Time Out Verified patient identification, verified procedure, site/side was marked, verified correct patient position, special equipment/implants available, medications/allergies/relevant history reviewed, required imaging and test results available.   Sterile Technique Maximal sterile technique including full sterile barrier drape, hand hygiene, sterile gown, sterile gloves, mask, hair covering, sterile ultrasound probe cover (if used).   Procedure Description Area of catheter insertion was cleaned with chlorhexidine and draped in sterile fashion. Without real-time ultrasound guidance an arterial catheter was placed into the left radial artery.  Appropriate arterial tracings confirmed on monitor.     Complications/Tolerance None; patient tolerated the procedure well.   EBL Minimal   Specimen(s) None

## 2021-11-09 NOTE — Consult Note (Signed)
[facility]       Reason for Consult: MSSA bacteremia    Referring Physician: CHAMP autoconsult  Principal Problem:   Sepsis ([place]) Active Problems:   AKI (acute kidney injury) ([place])   Septic shock ([place])   Acute metabolic encephalopathy   Acute respiratory failure with hypoxia and hypercapnia (HCC)    Chlorhexidine Gluconate Cloth  6 each Topical Daily   docusate  100 mg Per Tube BID   insulin glargine-yfgn  20 Units Subcutaneous Daily   methylPREDNISolone (SOLU-MEDROL) injection  40 mg Intravenous Q12H   mouth rinse  15 mL Mouth Rinse Q2H   pantoprazole (PROTONIX) IV  40 mg Intravenous Q12H   polyethylene glycol  17 g Per Tube Daily   sodium chloride flush  10-40 mL Intracatheter Q12H    Recommendations: Cefazolin Repeat blood cultures TTE  Assessment: He has a liver abscess at the site of his previous ablation and bacteremia with MSSA in 4/4 blood cultures with septic shock.    Antibiotics: Vancomycin + piperacillin/tazobactam  HPI: Ralph [name] is a 71 y.o. male with a history of NASH cirrhosis, hepatocellular carcinoma s/p microwave ablation in May here after being found down by family.  Found to have MSSA bacteremia and in shock on pressor support.  He is intubated.  Wife and granddaughter at bedside.  + fever to 104.  CT scan with a liver abscess at the site of the previous ablation.     Review of Systems:  Unable to be assessed due to patient factors  Past Medical History:  Diagnosis Date   Anginal pain ([place])    Arthritis    "back; fingers" (01/06/2012)   Atrial flutter (HCC)    s/p EPS +RF ablation of typical atrial flutter April 2015   Cancer [facility])    skin cancer   CHB (complete heart block) (HCC)    CHF (congestive heart failure) ([place]) 01/06/2012   Chronic lower back pain    Cirrhosis (HCC)    NASH-Hep A and B immune   Coughing up blood    "comes from my throat" (01/06/2012)   DDD (degenerative disc disease), lumbar     Depressed    Difficult intubation    Eschmann stylet used in 2002 and 2007; "trouble waking up afterwards" (01/06/2012)   Emphysema    Fatty liver disease, nonalcoholic    GERD (gastroesophageal reflux disease)    H/O hiatal hernia    Headache    History of esophageal varices    Hypertension    Hypothyroidism    Liver cancer ([place])    Orthostatic dizziness    Pacemaker    Pneumonia 11/2014   Presence of permanent cardiac pacemaker 9/292013   [place]   Sinus pause 01/06/2012   5.2 seconds   Sleep apnea    "don't wear mask" (01/06/2012)   Stroke ([place])    pt states that he might have had a stroke not sure   Type II diabetes mellitus (HCC)    Varicose vein    of esophagus    Social History   Tobacco Use   Smoking status: Every Day    Packs/day: 0.50    Years: 45.00    Total pack years: 22.50    Types: Cigarettes    Start date: 04/11/1968   Smokeless tobacco: Never   Tobacco comments:    Quit x 8 months this time  Vaping Use   Vaping Use: Never used  Substance Use Topics  Alcohol use: No    Alcohol/week: 0.0 standard drinks of alcohol    Comment: "quit alcohol 2011" Previously drinking socially about twice per month   Drug use: No    Family History  Problem Relation Age of Onset   Cancer Mother        Deceased, 12   Ovarian cancer Mother    Arrhythmia Father    Other Father        Deceased 47   Stroke Brother    Stroke Brother    Stroke Sister    Crohn's disease Daughter    Diabetes Maternal Grandmother    Colon cancer Neg Hx     Allergies  Allergen Reactions   Nitroglycerin Hives, Swelling and Rash    Physical Exam: Constitutional: intubated, sedated  Vitals:   November 15, 2021 1043 2021-11-15 1103  BP:    Pulse: (!) 126   Resp: (!) 35 (!) 35  Temp:  (!) 102.4 F (39.1 C)  SpO2: 98%    EYES: anicteric ENMT: + ET Cardiovascular: Tachy RR Respiratory: respiratory effort on vent GI: soft Musculoskeletal: bilateral cold extremities, no knee effusion,  dusky  Lab Results  Component Value Date   WBC 7.7 11-15-21   HGB 16.2 2021-11-15   HCT 46.1 11-15-21   MCV 95.1 November 15, 2021   PLT 11 (LL) November 15, 2021    Lab Results  Component Value Date   CREATININE 3.58 (H) Nov 15, 2021   BUN 66 (H) Nov 15, 2021   NA 138 Nov 15, 2021   K 3.8 2021-11-15   CL 104 15-Nov-2021   CO2 22 11/15/21    Lab Results  Component Value Date   ALT <5 10/17/2021   AST 53 (H) 11/01/2021   GGT 575 (A) 12/17/2012   ALKPHOS 181 (H) 10/20/2021     Microbiology: Recent Results (from the past 240 hour(s))  Blood Culture (routine x 2)     Status: None (Preliminary result)   Collection Time: 11/02/2021  4:14 PM   Specimen: BLOOD  Result Value Ref Range Status   Specimen Description BLOOD RIGHT ANTECUBITAL  Final   Special Requests   Final    BOTTLES DRAWN AEROBIC AND ANAEROBIC Blood Culture adequate volume   Culture  Setup Time   Final    GRAM POSITIVE COCCI IN CLUSTERS IN BOTH AEROBIC AND ANAEROBIC BOTTLES CRITICAL RESULT CALLED TO, READ BACK BY AND VERIFIED WITH: PHARMD K.PIERCE AT 0742 ON November 15, 2021 BY T.SAAD. Performed at [facility], [place] [address] [place], [place] [address]    Culture GRAM POSITIVE COCCI  Final   Report Status PENDING  Incomplete  Blood Culture ID Panel (Reflexed)     Status: Abnormal   Collection Time: 11/03/2021  4:14 PM  Result Value Ref Range Status   Enterococcus faecalis NOT DETECTED NOT DETECTED Final   Enterococcus Faecium NOT DETECTED NOT DETECTED Final   Listeria monocytogenes NOT DETECTED NOT DETECTED Final   Staphylococcus species DETECTED (A) NOT DETECTED Final    Comment: CRITICAL RESULT CALLED TO, READ BACK BY AND VERIFIED WITH: PHARMD K.PIERCE AT 0742 ON 2021/11/15 BY T.SAAD.    Staphylococcus aureus (BCID) DETECTED (A) NOT DETECTED Final    Comment: CRITICAL RESULT CALLED TO, READ BACK BY AND VERIFIED WITH: PHARMD K.PIERCE AT 0742 ON Nov 15, 2021 BY T.SAAD.    Staphylococcus epidermidis NOT DETECTED NOT  DETECTED Final   Staphylococcus lugdunensis NOT DETECTED NOT DETECTED Final   Streptococcus species NOT DETECTED NOT DETECTED Final   Streptococcus agalactiae NOT DETECTED NOT DETECTED Final  Streptococcus pneumoniae NOT DETECTED NOT DETECTED Final   Streptococcus pyogenes NOT DETECTED NOT DETECTED Final   A.calcoaceticus-baumannii NOT DETECTED NOT DETECTED Final   Bacteroides fragilis NOT DETECTED NOT DETECTED Final   Enterobacterales NOT DETECTED NOT DETECTED Final   Enterobacter cloacae complex NOT DETECTED NOT DETECTED Final   Escherichia coli NOT DETECTED NOT DETECTED Final   Klebsiella aerogenes NOT DETECTED NOT DETECTED Final   Klebsiella oxytoca NOT DETECTED NOT DETECTED Final   Klebsiella pneumoniae NOT DETECTED NOT DETECTED Final   Proteus species NOT DETECTED NOT DETECTED Final   Salmonella species NOT DETECTED NOT DETECTED Final   Serratia marcescens NOT DETECTED NOT DETECTED Final   Haemophilus influenzae NOT DETECTED NOT DETECTED Final   Neisseria meningitidis NOT DETECTED NOT DETECTED Final   Pseudomonas aeruginosa NOT DETECTED NOT DETECTED Final   Stenotrophomonas maltophilia NOT DETECTED NOT DETECTED Final   Candida albicans NOT DETECTED NOT DETECTED Final   Candida auris NOT DETECTED NOT DETECTED Final   Candida glabrata NOT DETECTED NOT DETECTED Final   Candida krusei NOT DETECTED NOT DETECTED Final   Candida parapsilosis NOT DETECTED NOT DETECTED Final   Candida tropicalis NOT DETECTED NOT DETECTED Final   Cryptococcus neoformans/gattii NOT DETECTED NOT DETECTED Final   Meth resistant mecA/C and MREJ NOT DETECTED NOT DETECTED Final    Comment: Performed at [facility], [address], [place], [place] [address]  Blood Culture (routine x 2)     Status: None (Preliminary result)   Collection Time: 11/06/2021  4:25 PM   Specimen: BLOOD LEFT HAND  Result Value Ref Range Status   Specimen Description BLOOD LEFT HAND  Final   Special Requests   Final    BOTTLES  DRAWN AEROBIC AND ANAEROBIC Blood Culture results may not be optimal due to an excessive volume of blood received in culture bottles   Culture  Setup Time   Final    GRAM POSITIVE COCCI IN CLUSTERS IN BOTH AEROBIC AND ANAEROBIC BOTTLES CRITICAL VALUE NOTED.  VALUE IS CONSISTENT WITH PREVIOUSLY REPORTED AND CALLED VALUE. Performed at [facility], [place] [address], [place], [place] [address]    Culture GRAM POSITIVE COCCI  Final   Report Status PENDING  Incomplete  MRSA Next Gen by PCR, Nasal     Status: None   Collection Time: 10/22/2021 10:31 PM   Specimen: Nasal Mucosa; Nasal Swab  Result Value Ref Range Status   MRSA by PCR Next Gen NOT DETECTED NOT DETECTED Final    Comment: (NOTE) The GeneXpert MRSA Assay (FDA approved for NASAL specimens only), is one component of a comprehensive MRSA colonization surveillance program. It is not intended to diagnose MRSA infection nor to guide or monitor treatment for MRSA infections. Test performance is not FDA approved in patients less than 98 years old. Performed at [facility], [place] [address] [place], [place] [address]     Brynda Heick W Marja Adderley, [place] for Infectious Disease [facility] [email or web address] 2021-11-10, 11:13 AM

## 2021-11-09 NOTE — [place] Notes (Signed)
PHARMACY - PHYSICIAN COMMUNICATION CRITICAL VALUE ALERT - BLOOD CULTURE IDENTIFICATION (BCID)  Ralph [name] is an 71 y.o. male who presented to [facility] on 10/13/2021 with a chief complaint of sepsis  Assessment:  4 out of 4 blood cultures positive for MSSA - source unclear  Name of physician (or Provider) Contacted: Dr. Silas Flood  Current antibiotics: Vanc and Zosyn  Changes to prescribed antibiotics recommended:  D/c Vancomycin Will keep Zosyn for possible liver abscess Recommendations accepted by provider  Results for orders placed or performed during the hospital encounter of 10/28/2021  Blood Culture ID Panel (Reflexed) (Collected: 10/15/2021  4:14 PM)  Result Value Ref Range   Enterococcus faecalis NOT DETECTED NOT DETECTED   Enterococcus Faecium NOT DETECTED NOT DETECTED   Listeria monocytogenes NOT DETECTED NOT DETECTED   Staphylococcus species DETECTED (A) NOT DETECTED   Staphylococcus aureus (BCID) DETECTED (A) NOT DETECTED   Staphylococcus epidermidis NOT DETECTED NOT DETECTED   Staphylococcus lugdunensis NOT DETECTED NOT DETECTED   Streptococcus species NOT DETECTED NOT DETECTED   Streptococcus agalactiae NOT DETECTED NOT DETECTED   Streptococcus pneumoniae NOT DETECTED NOT DETECTED   Streptococcus pyogenes NOT DETECTED NOT DETECTED   A.calcoaceticus-baumannii NOT DETECTED NOT DETECTED   Bacteroides fragilis NOT DETECTED NOT DETECTED   Enterobacterales NOT DETECTED NOT DETECTED   Enterobacter cloacae complex NOT DETECTED NOT DETECTED   Escherichia coli NOT DETECTED NOT DETECTED   Klebsiella aerogenes NOT DETECTED NOT DETECTED   Klebsiella oxytoca NOT DETECTED NOT DETECTED   Klebsiella pneumoniae NOT DETECTED NOT DETECTED   Proteus species NOT DETECTED NOT DETECTED   Salmonella species NOT DETECTED NOT DETECTED   Serratia marcescens NOT DETECTED NOT DETECTED   Haemophilus influenzae NOT DETECTED NOT DETECTED   Neisseria meningitidis NOT DETECTED NOT DETECTED    Pseudomonas aeruginosa NOT DETECTED NOT DETECTED   Stenotrophomonas maltophilia NOT DETECTED NOT DETECTED   Candida albicans NOT DETECTED NOT DETECTED   Candida auris NOT DETECTED NOT DETECTED   Candida glabrata NOT DETECTED NOT DETECTED   Candida krusei NOT DETECTED NOT DETECTED   Candida parapsilosis NOT DETECTED NOT DETECTED   Candida tropicalis NOT DETECTED NOT DETECTED   Cryptococcus neoformans/gattii NOT DETECTED NOT DETECTED   Meth resistant mecA/C and MREJ NOT DETECTED NOT DETECTED    Alanda Slim, PharmD, [facility] Clinical Pharmacist Please see AMION for all Pharmacists' Contact Phone Numbers Nov 15, 2021, 7:49 AM

## 2021-11-09 NOTE — [place] Notes (Signed)
[facility] [place] Note Patient Name: ROXIE GUEYE DOB: [birth year] MRN: [id number]   Date of Service  06-Nov-2021  HPI/Events of Note  Labs followed up with Na at 140, K 4.0. H&H 17.3/51.  Ical 1.09.  eICU Interventions  Calcium gluconate 1g ordered.      Intervention Category Intermediate Interventions: Other:  Elsie Lincoln 11-06-2021, 2:04 AM

## 2021-11-09 NOTE — Death Summary Note (Addendum)
DEATH SUMMARY   Patient Details  Name: Ralph [name] MRN: [id number] DOB: 05-14-1950  Admission/Discharge Information   Admit Date:  11/06/2021  Date of Death: Date of Death: 2021-11-01  Time of Death: Time of Death: 06-06-20  Length of Stay: 1  Referring Physician: Sharilyn Sites, MD   Reason(s) for Hospitalization  Severe sepsis with septic shock  Diagnoses  Preliminary cause of death: septic shock due to MSSA bacteremia and hepatic abscess Secondary Diagnoses (including complications and co-morbidities):  Principal Problem:   Septic shock ([place]) Active Problems:   Severe sepsis ([place])   AKI (acute kidney injury) ([place])   Acute metabolic encephalopathy   Acute respiratory failure with hypoxia and hypercapnia ([facility])   GI bleed   Lactic acid acidosis   Liver cirrhosis secondary to NASH ([place])   Hepatocellular carcinoma ([place])   MSSA bacteremia   Hepatic abscess   Brief Hospital Course (including significant findings, care, treatment, and services provided and events leading to death)  Ralph [name] is a 71 y.o. year old male who has history of [place] cirrhosis, hepatocellular carcinoma recently treated again with recurrence, found at his home to rest or distress encephalopathic.  Intubated in the ED.  Labs notable for renal failure.  Renal failure progressed during admission with oliguria/anuria and worsening creatinine.  Placed on broad-spectrum antibiotics Vancomycin and Zosyn.  Lactic acidosis initially greater than 9.  Improved with fluids antibiotics and pressors.  Lactic acidosis likely started to rise again.  Pressor requirement rose.  On 3 pressors.  Maxed out. After family meeting, patient was made DNR. Stress dose steroids were added.  MSSA bacteremia was discovered.  Hepatic abscess on imaging.  Narrowed to Zosyn.  Despite maximal medical therapy he continued to decline and eventually passed away at 1622 surrounded by loved ones.    Pertinent Labs and Studies  Significant  Diagnostic Studies ECHOCARDIOGRAM COMPLETE  Result Date: 2021/11/01    ECHOCARDIOGRAM REPORT   Patient Name:   Ralph [name] Date of Exam: 01-Nov-2021 Medical Rec #:  [id number]      Height:       70.0 in Accession #:    [id number]     Weight:       214.7 lb Date of Birth:  11-26-1950      BSA:          2.151 m Patient Age:    84 years       BP:           126/54 mmHg Patient Gender: M              HR:           130 bpm. Exam Location:  Inpatient Procedure: 2D Echo, Cardiac Doppler and Color Doppler Indications:    Shock  History:        Patient has no prior history of Echocardiogram examinations.                 COPD, Arrythmias:Atrial Fibrillation; Risk Factors:Hypertension                 and Diabetes.  Sonographer:    Jyl Heinz Referring Phys: [id number] Ralph [name]  Sonographer Comments: Echo performed with patient supine and on artificial respirator. IMPRESSIONS  1. Left ventricular ejection fraction, by estimation, is 70 to 75%. The left ventricle has hyperdynamic function. The left ventricle has no regional wall motion abnormalities. There is mild left ventricular hypertrophy. Left ventricular diastolic parameters are indeterminate.  2. Right ventricular systolic function is hyperdynamic. The right ventricular size is normal. There is mildly elevated pulmonary artery systolic pressure. The estimated right ventricular systolic pressure is 01.7 mmHg.  3. The mitral valve is normal in structure. No evidence of mitral valve regurgitation. No evidence of mitral stenosis.  4. The aortic valve is tricuspid. Aortic valve regurgitation is not visualized. No aortic stenosis is present.  5. The inferior vena cava is normal in size with <50% respiratory variability, suggesting right atrial pressure of 8 mmHg. Comparison(s): No prior Echocardiogram. FINDINGS  Left Ventricle: Left ventricular ejection fraction, by estimation, is 70 to 75%. The left ventricle has hyperdynamic function. The left ventricle has no  regional wall motion abnormalities. The left ventricular internal cavity size was normal in size. There is mild left ventricular hypertrophy. Left ventricular diastolic parameters are indeterminate. Right Ventricle: The right ventricular size is normal. No increase in right ventricular wall thickness. Right ventricular systolic function is hyperdynamic. There is mildly elevated pulmonary artery systolic pressure. The tricuspid regurgitant velocity is 2.78 m/s, and with an assumed right atrial pressure of 8 mmHg, the estimated right ventricular systolic pressure is 49.4 mmHg. Left Atrium: Left atrial size was normal in size. Right Atrium: Right atrial size was normal in size. Pericardium: There is no evidence of pericardial effusion. Mitral Valve: The mitral valve is normal in structure. No evidence of mitral valve regurgitation. No evidence of mitral valve stenosis. Tricuspid Valve: The tricuspid valve is normal in structure. Tricuspid valve regurgitation is mild . No evidence of tricuspid stenosis. Aortic Valve: The aortic valve is tricuspid. There is mild aortic valve annular calcification. Aortic valve regurgitation is not visualized. No aortic stenosis is present. Aortic valve peak gradient measures 10.6 mmHg. Pulmonic Valve: The pulmonic valve was not well visualized. Pulmonic valve regurgitation is not visualized. No evidence of pulmonic stenosis. Aorta: The aortic root and ascending aorta are structurally normal, with no evidence of dilitation. Venous: The inferior vena cava is normal in size with less than 50% respiratory variability, suggesting right atrial pressure of 8 mmHg. IAS/Shunts: No atrial level shunt detected by color flow Doppler.  LEFT VENTRICLE PLAX 2D LVIDd:         4.60 cm     Diastology LVIDs:         3.00 cm     LV e' medial:    3.81 cm/s LV PW:         1.20 cm     LV E/e' medial:  14.6 LV IVS:        1.10 cm     LV e' lateral:   6.09 cm/s LVOT diam:     2.00 cm     LV E/e' lateral: 9.1 LV  SV:         47 LV SV Index:   22 LVOT Area:     3.14 cm  LV Volumes (MOD) LV vol d, MOD A2C: 71.8 ml LV vol d, MOD A4C: 56.0 ml LV vol s, MOD A2C: 24.3 ml LV vol s, MOD A4C: 23.8 ml LV SV MOD A2C:     47.5 ml LV SV MOD A4C:     56.0 ml LV SV MOD BP:      41.3 ml RIGHT VENTRICLE             IVC RV Basal diam:  2.20 cm     IVC diam: 1.90 cm RV Mid diam:    1.80 cm RV S prime:  12.00 cm/s TAPSE (M-mode): 0.9 cm LEFT ATRIUM             Index        RIGHT ATRIUM          Index LA diam:        3.30 cm 1.53 cm/m   RA Area:     8.81 cm LA Vol (A2C):   46.1 ml 21.43 ml/m  RA Volume:   16.00 ml 7.44 ml/m LA Vol (A4C):   43.5 ml 20.22 ml/m LA Biplane Vol: 47.1 ml 21.89 ml/m  AORTIC VALVE AV Area (Vmax): 2.54 cm AV Vmax:        163.00 cm/s AV Peak Grad:   10.6 mmHg LVOT Vmax:      132.00 cm/s LVOT Vmean:     103.000 cm/s LVOT VTI:       0.149 m  AORTA Ao Root diam: 3.60 cm Ao Asc diam:  3.20 cm MITRAL VALVE               TRICUSPID VALVE MV Area (PHT): 3.63 cm    TR Peak grad:   30.9 mmHg MV Decel Time: 209 msec    TR Vmax:        278.00 cm/s MV E velocity: 55.70 cm/s MV A velocity: 43.70 cm/s  SHUNTS MV E/A ratio:  1.27        Systemic VTI:  0.15 m                            Systemic Diam: 2.00 cm Rudean Haskell MD Electronically signed by Rudean Haskell MD Signature Date/Time: 11-13-21/4:09:15 PM    Final    US Abdomen Limited RUQ (LIVER/GB)  Result Date: 11-13-21 CLINICAL DATA:  Hepatic abscess. 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 Common bile duct: Diameter: 4 mm, within normal limits. Liver: Slightly nodular liver contour is again seen consistent with cirrhosis. There is a heterogeneous echogenicity  region within the anterior left hepatic lobe corresponding to the fluid in air density collection at the site of the patient's prior microwave ablation site in the medial left hepatic lobe seen on yesterday CT. Some of the echogenic areas shadow and likely correspond to the metallic clips seen on CT. No internal color flow vascularity. Portal vein is patent on color Doppler imaging with normal direction of blood flow towards the liver. Other: None. IMPRESSION: 1. Heterogeneous collection within the medial segment of the left hepatic lobe corresponding to the fluid and gas collection at prior microwave ablation site seen on yesterday's CT. 2. Status post cholecystectomy. Electronically Signed   By: Yvonne Kendall [name]   On: 11/13/21 09:56   Portable Chest x-ray  Result Date: 11/06/2021 CLINICAL DATA:  [id number].  Respiratory distress. EXAM: PORTABLE CHEST 1 VIEW COMPARISON:  Chest x-ray 11/05/2021 7:27 p.m., CT chest 04/06/2021, CT abdomen pelvis 11/01/2021 FINDINGS: Left chest wall 2 lead pacemaker in similar position. Endotracheal tube with tip 2.5 cm above the carina. Endotracheal tube with tip and side port coursing below the hemidiaphragm overlying the expected region of the gastric lumen. Right internal jugular central venous catheter with tip overlying superior cavoatrial junction. The heart and mediastinal contours are unchanged. Aortic calcification. Query developing bilateral patchy airspace opacities. Chronic  coarsened markings. Bilateral trace pleural effusions. No pneumothorax. No acute osseous abnormality. IMPRESSION: 1. Query developing bilateral patchy airspace opacities. Followup PA and lateral chest X-ray is recommended in 3-4 weeks following therapy to ensure resolution and exclude underlying malignancy. 2. Bilateral trace pleural effusions. 3. Aortic Atherosclerosis (ICD10-I70.0) and Emphysema (ICD10-J43.9). Electronically Signed   By: Iven Finn [name]   On: 10/12/2021 22:59   CT ABDOMEN  PELVIS WO CONTRAST  Result Date: 11/08/2021 CLINICAL DATA:  Nausea/vomiting, unresponsive. Per EPIC, the patient is status post laparoscopic microwave ablation of the liver on May 10th. EXAM: CT ABDOMEN AND PELVIS WITHOUT CONTRAST TECHNIQUE: Multidetector CT imaging of the abdomen and pelvis was performed following the standard protocol without IV contrast. RADIATION DOSE REDUCTION: This exam was performed according to the departmental dose-optimization program which includes automated exposure control, adjustment of the mA and/or kV according to patient size and/or use of iterative reconstruction technique. COMPARISON:  CT abdomen dated 07/30/2020. FINDINGS: Lower chest: Small bilateral pleural effusions. Mild patchy right middle lobe and bilateral lower lobe opacities, favoring atelectasis. Hepatobiliary: Nodular hepatic contour, suggesting cirrhosis. 5.3 x 5.2 x 5.8 cm fluid and gas collection at the site of the patient's microwave ablation in the medial left hepatic lobe (series 2/image 29). There is associated gas extending beyond the liver capsule (series 2/image 28). This reflects tissue necrosis with post treatment abscess. Two 12 mm metallic clips/markers are present within the procedural bed, presumably related to biopsy, although correlation with the procedure is suggested. Status post cholecystectomy. No intrahepatic or extrahepatic duct dilatation. Pancreas: Within normal limits. Spleen: Within normal limits. Adrenals/Urinary Tract: Stable low-density nodularity of the bilateral adrenal glands, suggesting benign adrenal adenomas. Kidneys are within normal limits. No renal calculi or hydronephrosis. Bladder is decompressed by an indwelling Foley catheter. Stomach/Bowel: Enteric tube terminates in the proximal gastric body. No evidence of bowel obstruction. Normal appendix (series 2/image 81). No colonic wall thickening or inflammatory changes. Vascular/Lymphatic: No evidence of abdominal aortic  aneurysm. Atherosclerotic calcifications of the abdominal aorta and branch vessels. Small upper abdominal lymph nodes, likely reactive. Reproductive: Prostate is unremarkable. Other: No abdominopelvic ascites. Tiny fat containing left inguinal hernia (series 2/image 89). Musculoskeletal: Status post PLIF at L1-2. Degenerative changes of the lower thoracic spine. IMPRESSION: 5.8 cm fluid and gas collection at the site of the patient's microwave ablation in the medial left hepatic lobe, compatible with tissue necrosis and post treatment abscess. Small bilateral pleural effusions. Mild patchy right middle lobe and bilateral lower lobe opacities, favoring atelectasis. Additional ancillary findings as above. Electronically Signed   By: Julian Hy [name]   On: 11/05/2021 20:55   CT Head Wo Contrast  Result Date: 10/18/2021 CLINICAL DATA:  Altered mental status EXAM: CT HEAD WITHOUT CONTRAST TECHNIQUE: Contiguous axial images were obtained from the base of the skull through the vertex without intravenous contrast. RADIATION DOSE REDUCTION: This exam was performed according to the departmental dose-optimization program which includes automated exposure control, adjustment of the mA and/or kV according to patient size and/or use of iterative reconstruction technique. COMPARISON:  None Available. FINDINGS: Brain: No evidence of acute infarction, hemorrhage, hydrocephalus, extra-axial collection or mass lesion/mass effect. Vascular: No hyperdense vessel or unexpected calcification. Skull: Normal. Negative for fracture or focal lesion. Sinuses/Orbits: No acute finding. Other: None. IMPRESSION: Normal head CT. Electronically Signed   By: Julian Hy [name]   On: 10/15/2021 20:32   DG Chest 1V REPEAT Same Day  Result Date: 11/05/2021 CLINICAL DATA:  Central line placement  verification.  [id number] EXAM: CHEST - 1 VIEW SAME DAY COMPARISON:  Cxr 10/24/2021 FINDINGS: Endotracheal tube with tip terminating 3 cm above the  carina. Enteric tube coursing below the hemidiaphragm with side port overlying the expected region of the gastric lumen and tip collimated off view. Right internal jugular central venous catheter with tip overlying the expected region just distal to the superior cavoatrial junction. The heart and mediastinal contours are unchanged. Left chest wall 2 lead cardiac pacemaker in similar position. Cardiac paddles overlie the chest. Right base airspace opacity. Coarsened markings with no overt pulmonary edema. No pleural effusion. No pneumothorax. No acute osseous abnormality. IMPRESSION: 1. Right internal jugular central venous catheter with tip overlying the expected region just distal to the superior cavoatrial junction. 2. Right base airspace opacity. Finding could represent a combination of atelectasis versus infection/inflammation. 3.  Emphysema (ICD10-J43.9). Electronically Signed   By: Iven Finn [name]   On: 10/16/2021 19:27   DG Chest Port 1V same Day  Result Date: 11/01/2021 CLINICAL DATA:  ET and orogastric tube placement. EXAM: PORTABLE CHEST 1 VIEW COMPARISON:  10/10/2021 at 4:33 p.m. FINDINGS: Endotracheal tube tip projects 3.8 cm above the carina. Orogastric tube passes below the diaphragm into the mid stomach. Bilateral coarse interstitial opacities are without change. IMPRESSION: 1. Well-positioned endotracheal tube and nasal/orogastric tube. Electronically Signed   By: Lajean Manes [name]   On: 11/08/2021 18:04   DG Chest Port 1 View  Result Date: 10/25/2021 CLINICAL DATA:  Respiratory distress.  Possible sepsis. EXAM: PORTABLE CHEST 1 VIEW COMPARISON:  12/02/2017 FINDINGS: Left-sided pacemaker unchanged. Lungs are somewhat hypoinflated. No pleural effusion or pneumothorax. Mild diffuse bilateral interstitial prominence which could be due to vascular congestion versus interstitial pneumonitis. Stable chronic changes over the left upper lobe/apex. Cardiomediastinal silhouette and remainder of  the exam is unchanged. IMPRESSION: Mild diffuse bilateral interstitial prominence which could be due to vascular congestion versus interstitial pneumonitis. Electronically Signed   By: Marin Olp [name]   On: 10/20/2021 16:41   CUP PACEART REMOTE DEVICE CHECK  Result Date: 10/16/2021 Scheduled remote reviewed. Normal device function.  4 false ATR episode due to atrial events falling into blanking, all brief. Next remote 91 days. LKV   Microbiology Recent Results (from the past 240 hour(s))  Blood Culture (routine x 2)     Status: None (Preliminary result)   Collection Time: 11/03/2021  4:14 PM   Specimen: BLOOD  Result Value Ref Range Status   Specimen Description BLOOD RIGHT ANTECUBITAL  Final   Special Requests   Final    BOTTLES DRAWN AEROBIC AND ANAEROBIC Blood Culture adequate volume   Culture  Setup Time   Final    GRAM POSITIVE COCCI IN CLUSTERS IN BOTH AEROBIC AND ANAEROBIC BOTTLES CRITICAL RESULT CALLED TO, READ BACK BY AND VERIFIED WITH: PHARMD K.PIERCE AT 0742 ON 07-Nov-2021 BY T.SAAD. Performed at [facility], [place] [address], [place], [place] [address]    Culture GRAM POSITIVE COCCI  Final   Report Status PENDING  Incomplete  Blood Culture ID Panel (Reflexed)     Status: Abnormal   Collection Time: 10/27/2021  4:14 PM  Result Value Ref Range Status   Enterococcus faecalis NOT DETECTED NOT DETECTED Final   Enterococcus Faecium NOT DETECTED NOT DETECTED Final   Listeria monocytogenes NOT DETECTED NOT DETECTED Final   Staphylococcus species DETECTED (A) NOT DETECTED Final    Comment: CRITICAL RESULT CALLED TO, READ BACK BY AND VERIFIED WITH: PHARMD K.PIERCE AT  1478 ON 11-18-21 BY T.SAAD.    Staphylococcus aureus (BCID) DETECTED (A) NOT DETECTED Final    Comment: CRITICAL RESULT CALLED TO, READ BACK BY AND VERIFIED WITH: PHARMD K.PIERCE AT 0742 ON 11-18-21 BY T.SAAD.    Staphylococcus epidermidis NOT DETECTED NOT DETECTED Final   Staphylococcus lugdunensis NOT  DETECTED NOT DETECTED Final   Streptococcus species NOT DETECTED NOT DETECTED Final   Streptococcus agalactiae NOT DETECTED NOT DETECTED Final   Streptococcus pneumoniae NOT DETECTED NOT DETECTED Final   Streptococcus pyogenes NOT DETECTED NOT DETECTED Final   A.calcoaceticus-baumannii NOT DETECTED NOT DETECTED Final   Bacteroides fragilis NOT DETECTED NOT DETECTED Final   Enterobacterales NOT DETECTED NOT DETECTED Final   Enterobacter cloacae complex NOT DETECTED NOT DETECTED Final   Escherichia coli NOT DETECTED NOT DETECTED Final   Klebsiella aerogenes NOT DETECTED NOT DETECTED Final   Klebsiella oxytoca NOT DETECTED NOT DETECTED Final   Klebsiella pneumoniae NOT DETECTED NOT DETECTED Final   Proteus species NOT DETECTED NOT DETECTED Final   Salmonella species NOT DETECTED NOT DETECTED Final   Serratia marcescens NOT DETECTED NOT DETECTED Final   Haemophilus influenzae NOT DETECTED NOT DETECTED Final   Neisseria meningitidis NOT DETECTED NOT DETECTED Final   Pseudomonas aeruginosa NOT DETECTED NOT DETECTED Final   Stenotrophomonas maltophilia NOT DETECTED NOT DETECTED Final   Candida albicans NOT DETECTED NOT DETECTED Final   Candida auris NOT DETECTED NOT DETECTED Final   Candida glabrata NOT DETECTED NOT DETECTED Final   Candida krusei NOT DETECTED NOT DETECTED Final   Candida parapsilosis NOT DETECTED NOT DETECTED Final   Candida tropicalis NOT DETECTED NOT DETECTED Final   Cryptococcus neoformans/gattii NOT DETECTED NOT DETECTED Final   Meth resistant mecA/C and MREJ NOT DETECTED NOT DETECTED Final    Comment: Performed at [facility], [address] [place], [place] [address]  Blood Culture (routine x 2)     Status: None (Preliminary result)   Collection Time: 10/13/2021  4:25 PM   Specimen: BLOOD LEFT HAND  Result Value Ref Range Status   Specimen Description BLOOD LEFT HAND  Final   Special Requests   Final    BOTTLES DRAWN AEROBIC AND ANAEROBIC Blood Culture  results may not be optimal due to an excessive volume of blood received in culture bottles   Culture  Setup Time   Final    GRAM POSITIVE COCCI IN CLUSTERS IN BOTH AEROBIC AND ANAEROBIC BOTTLES CRITICAL VALUE NOTED.  VALUE IS CONSISTENT WITH PREVIOUSLY REPORTED AND CALLED VALUE. Performed at [facility], [place] [address], [place], [place] [address]    Culture GRAM POSITIVE COCCI  Final   Report Status PENDING  Incomplete  MRSA Next Gen by PCR, Nasal     Status: None   Collection Time: 10/17/2021 10:31 PM   Specimen: Nasal Mucosa; Nasal Swab  Result Value Ref Range Status   MRSA by PCR Next Gen NOT DETECTED NOT DETECTED Final    Comment: (NOTE) The GeneXpert MRSA Assay (FDA approved for NASAL specimens only), is one component of a comprehensive MRSA colonization surveillance program. It is not intended to diagnose MRSA infection nor to guide or monitor treatment for MRSA infections. Test performance is not FDA approved in patients less than 32 years old. Performed at [facility], [place] [address], [place], [place] [address]     Lab Basic Metabolic Panel: Recent Labs  Lab 10/27/2021 1614 18-Nov-2021 0013 2021-11-18 0224 2021/11/18 0616 18-Nov-2021 1213  NA 136  140 139 138 140  K 3.8 4.0 3.7 3.8 4.4  CL 95*  --  104  --   --   CO2 17*  --  22  --   --   GLUCOSE 459*  --  163*  --   --   BUN 60*  --  66*  --   --   CREATININE 2.85*  --  3.58*  --   --   CALCIUM 8.7*  --  7.5*  --   --   MG 2.8*  --  1.9  --   --    Liver Function Tests: Recent Labs  Lab 10/14/2021 1614  AST 53*  ALT <5  ALKPHOS 181*  BILITOT 2.9*  PROT 7.6  ALBUMIN 3.0*   No results for input(s): "LIPASE", "AMYLASE" in the last 168 hours. Recent Labs  Lab 11/08/2021 1614 2021-11-27 0224  AMMONIA 58* 48*   CBC: Recent Labs  Lab 10/15/2021 1614 11-27-2021 0013 11-27-21 0347 11-27-21 0616 11-27-2021 1019 11/27/2021 1213  WBC 7.7  --  4.1  --  7.7  --   NEUTROABS 7.0  --   --   --   --   --   HGB 19.3*  17.3* 15.9 16.3 16.2 16.0  HCT 58.8* 51.0 46.8 48.0 46.1 47.0  MCV 99.7  --  96.3  --  95.1  --   PLT 24*  --  11*  --  11*  --    Cardiac Enzymes: Recent Labs  Lab 11/03/2021 1614  CKTOTAL 366   Sepsis Labs: Recent Labs  Lab 11/03/2021 1614 10/12/2021 1749 11-27-21 0224 11/27/21 0347 2021/11/27 0643 2021-11-27 1019  WBC 7.7  --   --  4.1  --  7.7  LATICACIDVEN >9.0* 6.8* 4.8*  --  4.5*  --     Procedures/Operations  As per EMR   Bonna Gains Leavy Heatherly 2021-11-27, 5:22 PM

## 2021-11-09 NOTE — Progress Notes (Addendum)
eLink Physician-Brief Progress Note Patient Name: CEEJAY KEGLEY DOB: [birth year] MRN: [id number]   Date of Service  November 03, 2021  HPI/Events of Note  LA continues to improve.  Camera: Discussed with RN. MAP 68 on levo at 12 mcg/min UOP 140 in this shift. In synchrony with the vent.  2:20 AM labs reviewedworsening creatinine, K is ok. Co2 at 22.    eICU Interventions  - follow LA in AM. S/p 4 lit of fluids overall so far.      Intervention Category Intermediate Interventions: Other: (sepsis, LA follow throguh)  Elianne Gubser T Dimitrius Steedman 03-Nov-2021, 4:00 AM  5:14 AM Levophed gtt went up to 18, MAP 55, discussed with bed side RN. - art line by RT ordered stat, for real MAP measurement. -s/p liver lobectomy for cancer. S/p 4 lit fluids for shock. -albumin stat IV once, start Vasopressin 0.03.  Worsening renal failure. Last Vbg at 00:30 pH was at 7.28, improvingget a ABG post art line. INR 1.4

## 2021-11-09 NOTE — H&P (Signed)
NAME:  Ralph [name], MRN:  [id number], DOB:  20-Apr-1950, LOS: 1 ADMISSION DATE:  10/20/2021, CONSULTATION DATE:  10/25/2021 REFERRING MD:  Sabra Heck - [facility] EM, CHIEF COMPLAINT:  Shock   History of Present Illness:  Patient is encephalopathic and/or intubated. Therefore history has been obtained from chart review.   71 yo M PMH NASH cirrhosis, hx esophageal varices, dysphagia, Hepatocellular carcinoma undergoing tx, Afib-- on xarelto, who presented to [facility] ED 7/22 with respiratory distress. Pt was found unresponsive and agonal at home by family. On EMS arrival hypoxemic with sats 70s, Hypotensive SBP 80s. Covered in urine, hyperglycemic and in Afib RVR.  In ED, he required intubation as his mentation would not permit BiPAP. H received IVF and was started on pressors for ongoing shock.  Severe lactic acidosis, AKI on CKD, Hyperglycemia, Undifferentiated shock. Started on abx for possible sepsis.  PCCM consulted for admission to [facility] in this setting   Pertinent  Medical History  Afib Chronic anticoagulation NASH cirrhosis Esophageal varices HCC DM2  Orthostatic hypotension  Significant Hospital Events: Including procedures, antibiotic start and stop dates in addition to other pertinent events   7/22 presented to [facility], txr to [facility], PCCM consult 7/23 shock worsening with increasing pressors, LA trending down, Cr worsening, adding stress dose steroids, 4/4 staph aureus, possible hepatic abscess - zosyn, stopped vanc. Platelets 11, coffee ground appearence of OG output, transfusing platelets given concern of bleed, possible IR intervention for hepatic fluid collection  Interim History / Subjective:  shock worsening with increasing pressors, LA trending down, Cr worsening, adding stress dose steroids, 4/4 staph aureus, possible hepatic abscess - zosyn, stopped vanc. Platelets 11, coffee ground appearence of OG output, transfusing platelets given concern of bleed, possible IR intervention for  hepatic fluid collection   Objective   Blood pressure (!) 126/54, pulse (!) 125, temperature 100.1 F (37.8 C), temperature source Oral, resp. rate (!) 34, height $RemoveBe'5\' 10"'UcysTXbkY$  (1.778 m), weight 97.4 kg, SpO2 96 %. CVP:  [14 mmHg-22 mmHg] 22 mmHg  Vent Mode: PRVC FiO2 (%):  [60 %-100 %] 60 % Set Rate:  [24 bmp-32 bmp] 32 bmp Vt Set:  [580 mL] 580 mL PEEP:  [5 cmH20] 5 cmH20 Plateau Pressure:  [19 cmH20-25 cmH20] 25 cmH20   Intake/Output Summary (Last 24 hours) at 14-Nov-2021 4098 Last data filed at 11/14/2021 0600 Gross per 24 hour  Intake 3264.24 ml  Output 205 ml  Net 3059.24 ml    Filed Weights   10/13/2021 1732 11/07/2021 2300  Weight: 102.5 kg 97.4 kg    Examination: General: In bed, NAD, appears comfortable, chronically ill appearing HEENT: MM pink/moist, anicteric, atraumatic Neuro: sedated, PEERL 6mm CV: S1S2, Afib, no m/r/g appreciated PULM:  air movement in all lobes, trachea midline, chest expansion symmetric GI: soft, bsx4 hypoactive, non-tender   Extremities: upper extremities warm, no pretibial edema, capillary refill less than 3 seconds  Skin:  no rashes or lesions noted   Labs, Imaging: Cr worsening Plts lower at 12 Imaging CXR with L sided infiltrate likely Pna with small effusions CT A/P with hepatic fluid collection  Resolved Hospital Problem list     Assessment & Plan:  Severe sepsis, septic shock due to Staph aureus bacteremia and possible hepatic abscess Lactic acidosis, secondary to above --zosyn to cover staph (not MRSA) and possible hepatic abscess --vasopressors (NE and vaso currently), MAP > 65 --Stress dose steroids via solumedrol 40 mg BID added 7/23 --TTE --IR consult for evaluation of hepatic fluid collection --  CVP 17, adequately resuscitated  Acute metabolic encephalopathy: likely due to severe sepsis. Is on xarelto outpt, CT Head negative. --RASS goal -1 to -2 --wean off propofol (low dose this AM)  Acute respiratory failure with hypoxia  and hypercapnia: In setting of severe encephalopathy due to sepsis and L sided pneumonia, favor aspiration. --PRVC, minimize tidal volume, 8 cc/kg currently given severe metabolic acidosis, reduce as able --VAP bundle, stress ulcer ppx --abx as above  Afib RVR: On xarelto at home. Takes flecainide and Cardizem.  -Continue amio gtt, no AC with plts so low and concern for GI bleed -Goal K above 4, goal MG above 2  Hepatocellular carcinoma s/p TARE, microwave ablation  NASH cirrhosis  Elevated LFTs  Hyperbilirubinemia Hyperammonemia  Coagulopathy, mild --on admission Tbili 2.9 Alk phos 181 AST 53.  ALT is WNL at <5, ammonia 58 --trend LFTs, coags --Supportive care  AKI: Due to severe sepsis, likely ATN. Possible preceding hypovolemia ans unclear how ling down at home. Rhabdo considered but CK only in 300s. --Ensure renal perfusion. Goal MAP 65 or greater. --Avoid neprotoxic drugs as possible. --Strict I&O's --LR/d5 at 125, check CVP and if elevated will d/c fluids  Thrombocytopenia: Splenic sequestration due to cirrhosis now acutely lowered with severe sepsis.  Polycythemia: Query not wearing home O2 vs dehydration, improved with fluids --transfuse platelets, recheck in PM as may need more if plts remain < 50 with concern for bleed  Coffee ground material from NG: Initial bilious now coffee ground. Suspect trauma from NG placement in setting of severe thrombocytopenia.  --PPI IV BID --transfuse platelets goal > 50 --Consider GI consult once plts > 50  DM2 with hyperglycemia -transition off endotool, SSI  Best Practice (right click and "Reselect all SmartList Selections" daily)   Diet/type: NPO w/ meds via tube DVT prophylaxis: SCD GI prophylaxis: PPI Lines: Central line and yes and it is still needed Foley:  Yes, and it is still needed Code Status:  full code Last date of multidisciplinary goals of care discussion [Pending]  Labs   CBC: Recent Labs  Lab 11/02/2021 1614  Nov 27, 2021 0013 2021/11/27 0347 November 27, 2021 0616  WBC 7.7  --  4.1  --   NEUTROABS 7.0  --   --   --   HGB 19.3* 17.3* 15.9 16.3  HCT 58.8* 51.0 46.8 48.0  MCV 99.7  --  96.3  --   PLT 24*  --  11*  --      Basic Metabolic Panel: Recent Labs  Lab 10/29/2021 1614 11-27-21 0013 2021/11/27 0224 11/27/2021 0616  NA 136 140 139 138  K 3.8 4.0 3.7 3.8  CL 95*  --  104  --   CO2 17*  --  22  --   GLUCOSE 459*  --  163*  --   BUN 60*  --  66*  --   CREATININE 2.85*  --  3.58*  --   CALCIUM 8.7*  --  7.5*  --   MG 2.8*  --  1.9  --     GFR: Estimated Creatinine Clearance: 23.1 mL/min (A) (by C-G formula based on SCr of 3.58 mg/dL (H)). Recent Labs  Lab 10/26/2021 1614 10/17/2021 1749 2021/11/27 0224 27-Nov-2021 0347 11-27-2021 0643  WBC 7.7  --   --  4.1  --   LATICACIDVEN >9.0* 6.8* 4.8*  --  4.5*     Liver Function Tests: Recent Labs  Lab 10/30/21 1614  AST 53*  ALT <5  ALKPHOS 181*  BILITOT 2.9*  PROT 7.6  ALBUMIN 3.0*    No results for input(s): "LIPASE", "AMYLASE" in the last 168 hours. Recent Labs  Lab 10/15/2021 1614 2021/11/10 0224  AMMONIA 58* 48*     ABG    Component Value Date/Time   PHART 7.329 (L) 2021/11/10 0616   PCO2ART 41.2 Nov 10, 2021 0616   PO2ART 94 Nov 10, 2021 0616   HCO3 21.5 November 10, 2021 0616   TCO2 23 11-10-2021 0616   ACIDBASEDEF 4.0 (H) 11-10-21 0616   O2SAT 96 2021-11-10 0616     Coagulation Profile: Recent Labs  Lab 10/25/2021 1705  INR 1.4*     Cardiac Enzymes: Recent Labs  Lab 10/20/2021 1614  CKTOTAL 366     HbA1C: Hemoglobin A1C  Date/Time Value Ref Range Status  10/29/2020 09:43 AM 8.2 (A) 4.0 - 5.6 % Final  04/30/2020 07:54 AM 8.4 (A) 4.0 - 5.6 % Final   Hgb A1c MFr Bld  Date/Time Value Ref Range Status  11/21/2016 12:32 PM 7.1 (H) <5.7 % Final    Comment:      For someone without known diabetes, a hemoglobin A1c value of 6.5% or greater indicates that they may have diabetes and this should be confirmed with a follow-up  test.   For someone with known diabetes, a value <7% indicates that their diabetes is well controlled and a value greater than or equal to 7% indicates suboptimal control. A1c targets should be individualized based on duration of diabetes, age, comorbid conditions, and other considerations.   Currently, no consensus exists for use of hemoglobin A1c for diagnosis of diabetes for children.     08/11/2016 08:36 AM 6.6 (H) <5.7 % Final    Comment:      For someone without known diabetes, a hemoglobin A1c value of 6.5% or greater indicates that they may have diabetes and this should be confirmed with a follow-up test.   For someone with known diabetes, a value <7% indicates that their diabetes is well controlled and a value greater than or equal to 7% indicates suboptimal control. A1c targets should be individualized based on duration of diabetes, age, comorbid conditions, and other considerations.   Currently, no consensus exists for use of hemoglobin A1c for diagnosis of diabetes for children.       CBG: Recent Labs  Lab 11-10-2021 0218 10-Nov-2021 0308 11/10/21 0411 2021/11/10 0527 November 10, 2021 0740  GLUCAP 155* 163* 155* 165* 186*     Review of Systems:   Unable to obtain ROS due to patient status  Past Medical History:  He,  has a past medical history of Anginal pain ([place]), Arthritis, Atrial flutter ([place]), Cancer ([place]), CHB (complete heart block) ([place]), CHF (congestive heart failure) ([place]) (01/06/2012), Chronic lower back pain, Cirrhosis ([place]), Coughing up blood, DDD (degenerative disc disease), lumbar, Depressed, Difficult intubation, Emphysema, Fatty liver disease, nonalcoholic, GERD (gastroesophageal reflux disease), H/O hiatal hernia, Headache, History of esophageal varices, Hypertension, Hypothyroidism, Liver cancer ([place]), Orthostatic dizziness, Pacemaker, Pneumonia (11/2014), Presence of permanent cardiac pacemaker (9/292013), Sinus pause (01/06/2012), Sleep apnea, Stroke  ([place]), Type II diabetes mellitus ([place]), and Varicose vein.   Surgical History:   Past Surgical History:  Procedure Laterality Date   ATRIAL FLUTTER ABLATION N/A 07/10/2013   Procedure: ATRIAL FLUTTER ABLATION;  Surgeon: Evans Lance, MD;  Location: [facility] CATH LAB;  Service: Cardiovascular;  Laterality: N/A;   [place]   COLONOSCOPY  11/08/2004   MVE:HMCNOB rectum, colon, TI.   COLONOSCOPY N/A 05/28/2014  Dr. Gala Romney: Redundant colon. single colonic polyp removed as described above. Tubular adenoma   COLONOSCOPY WITH PROPOFOL N/A 09/02/2021   Procedure: COLONOSCOPY WITH PROPOFOL;  Surgeon: Daneil Dolin, MD;  [facility] SU[facility];  Service: Endoscopy;  Laterality: N/A;  9:15AM   ESOPHAGEAL DILATION N/A 05/28/2014   Procedure: ESOPHAGEAL DILATION;  Surgeon: Daneil Dolin, MD;  [facility];  Service: Endoscopy;  Laterality: N/A;   ESOPHAGOGASTRODUODENOSCOPY  11/08/2004   MWN:UUVOZD esophageal erosions consistent with erosive reflux esophagitis/Areas of hemorrhage and nodularity of the fundal mucosa of uncertain significance, biopsied.  Small hiatal hernia, otherwise normal stomach   ESOPHAGOGASTRODUODENOSCOPY  2010   Dr. Gala Romney: 3 columns Grade 1 varices, erosive esophagitis, HH, portal gastropathy, normal D1, D2   ESOPHAGOGASTRODUODENOSCOPY N/A 05/28/2014   Dr. Gala Romney: MIld erosive reflux esophagitis. Grade 1 esophageal varices. Patent esophagus. No dilation performed. Hiatal hernia.    ESOPHAGOGASTRODUODENOSCOPY (EGD) WITH ESOPHAGEAL DILATION N/A 02/14/2013   GUY:QIHKV 1 esophageal varices. Abnormal distal esophagus/status post biopsy after [place] dilation. Portal gastropathy. Antral erosions-status post biopsy. path negative for H.pylori, benign path.   ESOPHAGOGASTRODUODENOSCOPY (EGD) WITH PROPOFOL N/A 09/02/2021   Procedure: ESOPHAGOGASTRODUODENOSCOPY (EGD) WITH PROPOFOL;  Surgeon: Daneil Dolin, MD;  [facility];  Service: Endoscopy;   Laterality: N/A;   IR 3D INDEPENDENT WKST  05/24/2021   IR ANGIOGRAM SELECTIVE EACH ADDITIONAL VESSEL  04/30/2021   IR ANGIOGRAM SELECTIVE EACH ADDITIONAL VESSEL  04/30/2021   IR ANGIOGRAM SELECTIVE EACH ADDITIONAL VESSEL  04/30/2021   IR ANGIOGRAM SELECTIVE EACH ADDITIONAL VESSEL  04/30/2021   IR ANGIOGRAM SELECTIVE EACH ADDITIONAL VESSEL  04/30/2021   IR ANGIOGRAM SELECTIVE EACH ADDITIONAL VESSEL  05/24/2021   IR ANGIOGRAM VISCERAL SELECTIVE  04/30/2021   IR ANGIOGRAM VISCERAL SELECTIVE  04/30/2021   IR EMBO ARTERIAL NOT HEMORR HEMANG INC GUIDE ROADMAPPING  04/30/2021   IR EMBO TUMOR ORGAN ISCHEMIA INFARCT INC GUIDE ROADMAPPING  05/24/2021   IR RADIOLOGIST EVAL & MGMT  03/03/2021   IR US GUIDE VASC ACCESS RIGHT  04/30/2021   IR US GUIDE VASC ACCESS RIGHT  05/24/2021   LUMBAR [place] SURGERY  1994; ~ 1995; ~ [place]   nuclear stress test  10/19/2004   No ischemia   PERMANENT PACEMAKER INSERTION  01/08/2012   CHB   PERMANENT PACEMAKER INSERTION N/A 01/09/2012   Procedure: PERMANENT PACEMAKER INSERTION;  Surgeon: Sanda Klein, MD;  Location: [place] CATH LAB;  Service: Cardiovascular;  Laterality: N/A;   POSTERIOR FUSION LUMBAR SPINE  1999   L4-5   SPINAL CORD STIMULATOR IMPLANT  2006   SPINAL CORD STIMULATOR REMOVAL N/A 01/27/2015   Procedure: LUMBAR SPINAL CORD STIMULATOR REMOVAL;  Surgeon: Kristeen Miss, MD;  Location: [place] NEURO ORS;  Service: Neurosurgery;  Laterality: N/A;  LUMBAR SPINAL CORD STIMULATOR REMOVAL   TONSILLECTOMY AND ADENOIDECTOMY  1992   US ECHOCARDIOGRAPHY  12/28/2011   mild LVH,mild mitral annulara ca+,mild MR   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 He started smoking about 53 years ago. He has a 22.50 pack-year smoking history. He has never used smokeless tobacco. He reports that he does not drink alcohol and does not use drugs.   Family History:  His family history includes Arrhythmia  in his father; Cancer in his mother; Crohn's disease in his daughter; Diabetes in his maternal grandmother; Other in his father; Ovarian cancer in  his mother; Stroke in his brother, brother, and sister. There is no history of Colon cancer.   Allergies Allergies  Allergen Reactions   Nitroglycerin Hives, Swelling and Rash     Home Medications  Prior to Admission medications   Medication Sig Start Date End Date Taking? Authorizing Provider  ACCU-CHEK GUIDE test strip TEST TWICE A DAY BEFORE MEALS 01/13/20   Shamleffer, Melanie Crazier, MD  acetaminophen (TYLENOL) 500 MG tablet Take 500 mg by mouth every 6 (six) hours as needed for moderate pain.    [provider]  Blood Glucose Monitoring Suppl (ACCU-CHEK AVIVA) device by Other route. Use as instructed to check blood sugar 2 times daily    [provider]  ciclopirox (PENLAC) 8 % solution APPLY TOPICALLY AT BEDTIME. APPLY OVER NAIL AND SURROUNDING SKIN. APPLY DAILY OVER PREVIOUS COAT. AFTER SEVEN (7) DAYS, MAY REMOVE WITH ALCOHOL AND CONTINUE CYCLE. 07/14/21   Lorenda Peck, DPM  Continuous Blood Gluc Receiver (DEXCOM G6 RECEIVER) DEVI Use as instruct to check blood sugar daily 05/18/20   Shamleffer, Melanie Crazier, MD  Continuous Blood Gluc Sensor (DEXCOM G6 SENSOR) MISC 1 Device by Does not apply route as directed. 04/30/20   Shamleffer, Melanie Crazier, MD  Continuous Blood Gluc Transmit (DEXCOM G6 TRANSMITTER) MISC 1 Device by Does not apply route as directed. 04/30/20   Shamleffer, Melanie Crazier, MD  dapagliflozin propanediol (FARXIGA) 10 MG TABS tablet Take 1 tablet (10 mg total) by mouth daily. 05/03/21   Shamleffer, Melanie Crazier, MD  diazepam (VALIUM) 10 MG tablet Take 1 tablet by mouth every 8 (eight) hours as needed (muscle spasms). 05/20/19   [provider]  diltiazem (CARDIZEM CD) 300 MG 24 hr capsule TAKE 1 CAPSULE BY MOUTH EVERY DAY 07/12/21   Arnoldo Lenis, MD  escitalopram (LEXAPRO) 10 MG tablet Take  10 mg by mouth daily. 04/19/19   [provider]  flecainide (TAMBOCOR) 100 MG tablet TAKE 1 TABLET BY MOUTH TWICE A DAY 05/21/21   Evans Lance, MD  furosemide (LASIX) 40 MG tablet Take 40 mg by mouth daily as needed for fluid or edema.    [provider]  Insulin Aspart FlexPen (NOVOLOG) 100 UNIT/ML Max daily 50 units Patient taking differently: Inject 10-14 Units into the skin 3 (three) times daily before meals. Max daily 50 units 05/03/21   Shamleffer, Melanie Crazier, MD  Insulin Degludec FlexTouch 100 UNIT/ML SOPN Inject 32 Units into the skin daily. 05/03/21   Shamleffer, Melanie Crazier, MD  Insulin Pen Needle 31G X 5 MM MISC 1 Device by Does not apply route in the morning, at noon, in the evening, and at bedtime. 05/03/21   Shamleffer, Melanie Crazier, MD  levothyroxine (SYNTHROID, LEVOTHROID) 112 MCG tablet TAKE 1 TABLET BY MOUTH EVERY MORNING BEFORE BREAKFAST 04/24/17   Cassandria Anger, MD  metFORMIN (GLUCOPHAGE) 1000 MG tablet Take 1 tablet (1,000 mg total) by mouth 2 (two) times daily with a meal. 05/03/21   Shamleffer, Melanie Crazier, MD  Omega-3 Fatty Acids (FISH OIL) 1000 MG CAPS Take 1,000 mg by mouth in the morning and at bedtime.    [provider]  oxymetazoline (AFRIN) 0.05 % nasal spray Place 1 spray into both nostrils 2 (two) times daily as needed for congestion. Sinex brand    [provider]  pantoprazole (PROTONIX) 40 MG tablet Take 1 tablet (40 mg total) by mouth daily. 03/01/21   Erenest Rasher, PA-C  Polyethyl Glycol-Propyl Glycol (SYSTANE) 0.4-0.3 % SOLN Place  1 drop into both eyes every 6 (six) hours as needed (dry eyes).    [provider]  polyethylene glycol-electrolytes (TRILYTE) 420 g solution Take 4,000 mLs by mouth as directed. 08/24/21   Rourk, [name], MD  propranolol (INDERAL) 20 MG tablet TAKE 1 TABLET BY MOUTH TWICE A DAY 02/23/21   Mahala Menghini, PA-C  rivaroxaban (XARELTO) 20 MG TABS tablet TAKE 1 TABLET  BY MOUTH EVERY DAY WITH DINNER 05/09/18   Evans Lance, MD  simvastatin (ZOCOR) 20 MG tablet TAKE 1 TABLET BY MOUTH EVERY DAY IN THE EVENING 09/16/21   Arnoldo Lenis, MD  spironolactone (ALDACTONE) 50 MG tablet TAKE 1 TABLET BY MOUTH TWICE A DAY 02/23/21   Mahala Menghini, PA-C     Critical care time:    CRITICAL CARE Performed by: Lanier Clam   Total critical care time: 60 minutes  Critical care time was exclusive of separately billable procedures and treating other patients.  Critical care was necessary to treat or prevent imminent or life-threatening deterioration.  Critical care was time spent personally by me on the following activities: development of treatment plan with patient and/or surrogate as well as nursing, discussions with consultants, evaluation of patient's response to treatment, examination of patient, obtaining history from patient or surrogate, ordering and performing treatments and interventions, ordering and review of laboratory studies, ordering and review of radiographic studies, pulse oximetry and re-evaluation of patient's condition.   Lanier Clam, MD [facility]  2021/11/27 , 8:08 AM  Please see [email or web address] for contact info If no response, please call [phone] After hours, please call Elink at [phone]

## 2021-11-09 NOTE — IPAL (Signed)
  Interdisciplinary Goals of Care Family Meeting   Date carried out: November 03, 2021  Location of the meeting: Bedside  Member's involved: Physician and Family Member or next of kin  Durable Power of Attorney or Loss adjuster, chartered: wife present    Discussion: We discussed goals of care for [employer] .  Patient presented after being found down at rest or distress and altered mental status.  Intubated in the ED.  Labs notable for multiorgan failure including renal failure.  Unfortunately throughout the day has developed worsening shock refractory to vasopressors.  Escalating to the day now along max dose norepinephrine, VASOPRESSIN, EPINEPHRINE.  Stress dose steroids Added.  He has MSSA bacteremia as well as hepatic abscess, unclear if related.  He is too unstable and too coagulopathic to undergo drainage of the hepatic abscess.  Given his multisystem organ failure and worsening shock despite maximal medical efforts no other options, recommended DNR.  Met with multiple family numbers including children.  After discussion DNR was agreed to amongst the family.  I encouraged anyone who wants to visit and see him before advised to come urgently.  Minutes to hours to live at this time.  Code status: Full DNR  Disposition: Continue current acute care  Time spent for the meeting: 8 minutes    Lanier Clam, MD  11/03/2021, 2:42 PM

## 2021-11-09 DEATH — deceased

## 2021-11-26 ENCOUNTER — Ambulatory Visit: Payer: Medicare HMO | Admitting: Internal Medicine

## 2021-12-17 ENCOUNTER — Ambulatory Visit: Payer: Medicare HMO | Admitting: Cardiology

## 2022-01-25 ENCOUNTER — Ambulatory Visit: Payer: Medicare HMO | Admitting: Gastroenterology

## 2022-02-25 ENCOUNTER — Ambulatory Visit: Payer: Medicare HMO | Admitting: Internal Medicine
